# Patient Record
Sex: Female | Born: 1940 | Race: White | Hispanic: No | Smoking: Former smoker
Health system: Southern US, Community
[De-identification: ages and names within clinical notes are randomized; demographics above are authoritative.]

## PROBLEM LIST (undated history)

## (undated) DIAGNOSIS — I82409 Acute embolism and thrombosis of unspecified deep veins of unspecified lower extremity: Secondary | ICD-10-CM

## (undated) DIAGNOSIS — I700 Atherosclerosis of aorta: Secondary | ICD-10-CM

## (undated) DIAGNOSIS — M8580 Other specified disorders of bone density and structure, unspecified site: Secondary | ICD-10-CM

## (undated) DIAGNOSIS — I341 Nonrheumatic mitral (valve) prolapse: Secondary | ICD-10-CM

## (undated) DIAGNOSIS — I739 Peripheral vascular disease, unspecified: Secondary | ICD-10-CM

## (undated) DIAGNOSIS — E041 Nontoxic single thyroid nodule: Secondary | ICD-10-CM

## (undated) DIAGNOSIS — F411 Generalized anxiety disorder: Secondary | ICD-10-CM

## (undated) DIAGNOSIS — Z9889 Other specified postprocedural states: Secondary | ICD-10-CM

## (undated) DIAGNOSIS — I872 Venous insufficiency (chronic) (peripheral): Secondary | ICD-10-CM

## (undated) DIAGNOSIS — R9430 Abnormal result of cardiovascular function study, unspecified: Secondary | ICD-10-CM

## (undated) DIAGNOSIS — J984 Other disorders of lung: Secondary | ICD-10-CM

## (undated) DIAGNOSIS — Z860100 Personal history of colon polyps, unspecified: Secondary | ICD-10-CM

## (undated) DIAGNOSIS — K5730 Diverticulosis of large intestine without perforation or abscess without bleeding: Secondary | ICD-10-CM

## (undated) DIAGNOSIS — J309 Allergic rhinitis, unspecified: Secondary | ICD-10-CM

## (undated) DIAGNOSIS — S86011A Strain of right Achilles tendon, initial encounter: Secondary | ICD-10-CM

## (undated) DIAGNOSIS — E785 Hyperlipidemia, unspecified: Secondary | ICD-10-CM

## (undated) DIAGNOSIS — E669 Obesity, unspecified: Secondary | ICD-10-CM

## (undated) DIAGNOSIS — R4702 Dysphasia: Secondary | ICD-10-CM

## (undated) DIAGNOSIS — M519 Unspecified thoracic, thoracolumbar and lumbosacral intervertebral disc disorder: Secondary | ICD-10-CM

## (undated) DIAGNOSIS — I2510 Atherosclerotic heart disease of native coronary artery without angina pectoris: Secondary | ICD-10-CM

## (undated) DIAGNOSIS — M1990 Unspecified osteoarthritis, unspecified site: Secondary | ICD-10-CM

## (undated) DIAGNOSIS — Q211 Atrial septal defect: Secondary | ICD-10-CM

## (undated) DIAGNOSIS — K219 Gastro-esophageal reflux disease without esophagitis: Secondary | ICD-10-CM

## (undated) DIAGNOSIS — M545 Low back pain, unspecified: Secondary | ICD-10-CM

## (undated) DIAGNOSIS — D649 Anemia, unspecified: Secondary | ICD-10-CM

## (undated) DIAGNOSIS — R112 Nausea with vomiting, unspecified: Secondary | ICD-10-CM

## (undated) DIAGNOSIS — I639 Cerebral infarction, unspecified: Secondary | ICD-10-CM

## (undated) DIAGNOSIS — Q2112 Patent foramen ovale: Secondary | ICD-10-CM

## (undated) DIAGNOSIS — I10 Essential (primary) hypertension: Secondary | ICD-10-CM

## (undated) DIAGNOSIS — G629 Polyneuropathy, unspecified: Secondary | ICD-10-CM

## (undated) DIAGNOSIS — R195 Other fecal abnormalities: Secondary | ICD-10-CM

## (undated) DIAGNOSIS — K449 Diaphragmatic hernia without obstruction or gangrene: Secondary | ICD-10-CM

## (undated) DIAGNOSIS — I2699 Other pulmonary embolism without acute cor pulmonale: Secondary | ICD-10-CM

## (undated) DIAGNOSIS — I779 Disorder of arteries and arterioles, unspecified: Secondary | ICD-10-CM

## (undated) DIAGNOSIS — G4736 Sleep related hypoventilation in conditions classified elsewhere: Secondary | ICD-10-CM

## (undated) DIAGNOSIS — I5032 Chronic diastolic (congestive) heart failure: Secondary | ICD-10-CM

## (undated) DIAGNOSIS — G4730 Sleep apnea, unspecified: Secondary | ICD-10-CM

## (undated) DIAGNOSIS — R002 Palpitations: Secondary | ICD-10-CM

## (undated) DIAGNOSIS — R0602 Shortness of breath: Secondary | ICD-10-CM

## (undated) DIAGNOSIS — E538 Deficiency of other specified B group vitamins: Secondary | ICD-10-CM

## (undated) DIAGNOSIS — I6350 Cerebral infarction due to unspecified occlusion or stenosis of unspecified cerebral artery: Secondary | ICD-10-CM

## (undated) HISTORY — DX: Patent foramen ovale: Q21.12

## (undated) HISTORY — DX: Nonrheumatic mitral (valve) prolapse: I34.1

## (undated) HISTORY — PX: CHOLECYSTECTOMY: SHX55

## (undated) HISTORY — DX: Disorder of arteries and arterioles, unspecified: I77.9

## (undated) HISTORY — DX: Shortness of breath: R06.02

## (undated) HISTORY — DX: Dysphasia: R47.02

## (undated) HISTORY — DX: Palpitations: R00.2

## (undated) HISTORY — DX: Generalized anxiety disorder: F41.1

## (undated) HISTORY — DX: Nontoxic single thyroid nodule: E04.1

## (undated) HISTORY — DX: Venous insufficiency (chronic) (peripheral): I87.2

## (undated) HISTORY — DX: Cerebral infarction due to unspecified occlusion or stenosis of unspecified cerebral artery: I63.50

## (undated) HISTORY — DX: Acute embolism and thrombosis of unspecified deep veins of unspecified lower extremity: I82.409

## (undated) HISTORY — DX: Low back pain: M54.5

## (undated) HISTORY — DX: Peripheral vascular disease, unspecified: I73.9

## (undated) HISTORY — DX: Diaphragmatic hernia without obstruction or gangrene: K44.9

## (undated) HISTORY — DX: Diverticulosis of large intestine without perforation or abscess without bleeding: K57.30

## (undated) HISTORY — PX: BACK SURGERY: SHX140

## (undated) HISTORY — DX: Other specified disorders of bone density and structure, unspecified site: M85.80

## (undated) HISTORY — DX: Other fecal abnormalities: R19.5

## (undated) HISTORY — DX: Unspecified thoracic, thoracolumbar and lumbosacral intervertebral disc disorder: M51.9

## (undated) HISTORY — PX: LAPAROSCOPIC HYSTERECTOMY: SHX1926

## (undated) HISTORY — DX: Chronic diastolic (congestive) heart failure: I50.32

## (undated) HISTORY — DX: Anemia, unspecified: D64.9

## (undated) HISTORY — PX: LUMBAR DISC SURGERY: SHX700

## (undated) HISTORY — DX: Atherosclerotic heart disease of native coronary artery without angina pectoris: I25.10

## (undated) HISTORY — DX: Personal history of colonic polyps: Z86.010

## (undated) HISTORY — DX: Low back pain, unspecified: M54.50

## (undated) HISTORY — PX: OTHER SURGICAL HISTORY: SHX169

## (undated) HISTORY — DX: Personal history of colon polyps, unspecified: Z86.0100

## (undated) HISTORY — DX: Hyperlipidemia, unspecified: E78.5

## (undated) HISTORY — DX: Atherosclerosis of aorta: I70.0

## (undated) HISTORY — DX: Atrial septal defect: Q21.1

## (undated) HISTORY — DX: Other pulmonary embolism without acute cor pulmonale: I26.99

## (undated) HISTORY — DX: Sleep related hypoventilation in conditions classified elsewhere: G47.36

## (undated) HISTORY — DX: Abnormal result of cardiovascular function study, unspecified: R94.30

## (undated) HISTORY — DX: Gastro-esophageal reflux disease without esophagitis: K21.9

## (undated) HISTORY — DX: Cerebral infarction, unspecified: I63.9

## (undated) HISTORY — DX: Unspecified osteoarthritis, unspecified site: M19.90

## (undated) HISTORY — DX: Allergic rhinitis, unspecified: J30.9

## (undated) HISTORY — DX: Obesity, unspecified: E66.9

## (undated) HISTORY — DX: Other disorders of lung: J98.4

## (undated) HISTORY — PX: VARICOSE VEIN SURGERY: SHX832

---

## 1989-02-11 HISTORY — PX: CORONARY ANGIOPLASTY WITH STENT PLACEMENT: SHX49

## 1991-06-14 HISTORY — PX: OTHER SURGICAL HISTORY: SHX169

## 1998-03-26 ENCOUNTER — Emergency Department (HOSPITAL_COMMUNITY): Admission: EM | Admit: 1998-03-26 | Discharge: 1998-03-26 | Payer: Self-pay | Admitting: Emergency Medicine

## 1998-09-03 ENCOUNTER — Other Ambulatory Visit: Admission: RE | Admit: 1998-09-03 | Discharge: 1998-09-03 | Payer: Self-pay | Admitting: Obstetrics & Gynecology

## 2000-03-16 ENCOUNTER — Other Ambulatory Visit: Admission: RE | Admit: 2000-03-16 | Discharge: 2000-03-16 | Payer: Self-pay | Admitting: Obstetrics & Gynecology

## 2001-10-15 ENCOUNTER — Ambulatory Visit (HOSPITAL_COMMUNITY): Admission: RE | Admit: 2001-10-15 | Discharge: 2001-10-15 | Payer: Self-pay | Admitting: Neurology

## 2001-10-15 ENCOUNTER — Encounter: Payer: Self-pay | Admitting: Neurology

## 2001-12-24 ENCOUNTER — Ambulatory Visit (HOSPITAL_COMMUNITY): Admission: RE | Admit: 2001-12-24 | Discharge: 2001-12-24 | Payer: Self-pay | Admitting: Pulmonary Disease

## 2001-12-24 ENCOUNTER — Encounter: Payer: Self-pay | Admitting: Pulmonary Disease

## 2002-01-14 ENCOUNTER — Ambulatory Visit (HOSPITAL_COMMUNITY): Admission: RE | Admit: 2002-01-14 | Discharge: 2002-01-14 | Payer: Self-pay | Admitting: Gastroenterology

## 2002-01-14 ENCOUNTER — Encounter: Payer: Self-pay | Admitting: Gastroenterology

## 2003-01-17 ENCOUNTER — Ambulatory Visit (HOSPITAL_COMMUNITY): Admission: RE | Admit: 2003-01-17 | Discharge: 2003-01-17 | Payer: Self-pay | Admitting: Obstetrics & Gynecology

## 2003-01-17 ENCOUNTER — Encounter: Payer: Self-pay | Admitting: Obstetrics & Gynecology

## 2003-01-31 ENCOUNTER — Encounter: Admission: RE | Admit: 2003-01-31 | Discharge: 2003-01-31 | Payer: Self-pay | Admitting: Gastroenterology

## 2003-01-31 ENCOUNTER — Encounter: Payer: Self-pay | Admitting: Gastroenterology

## 2003-03-03 ENCOUNTER — Ambulatory Visit (HOSPITAL_COMMUNITY): Admission: RE | Admit: 2003-03-03 | Discharge: 2003-03-03 | Payer: Self-pay | Admitting: Neurosurgery

## 2003-03-03 ENCOUNTER — Encounter: Payer: Self-pay | Admitting: Neurosurgery

## 2003-04-07 ENCOUNTER — Ambulatory Visit (HOSPITAL_COMMUNITY): Admission: RE | Admit: 2003-04-07 | Discharge: 2003-04-07 | Payer: Self-pay | Admitting: Cardiovascular Disease

## 2003-06-12 ENCOUNTER — Inpatient Hospital Stay (HOSPITAL_COMMUNITY): Admission: RE | Admit: 2003-06-12 | Discharge: 2003-06-20 | Payer: Self-pay | Admitting: Neurosurgery

## 2004-04-07 ENCOUNTER — Other Ambulatory Visit: Admission: RE | Admit: 2004-04-07 | Discharge: 2004-04-07 | Payer: Self-pay | Admitting: Obstetrics & Gynecology

## 2004-04-21 ENCOUNTER — Ambulatory Visit: Payer: Self-pay | Admitting: Pulmonary Disease

## 2004-04-26 ENCOUNTER — Ambulatory Visit: Payer: Self-pay | Admitting: Pulmonary Disease

## 2004-07-22 ENCOUNTER — Ambulatory Visit: Payer: Self-pay | Admitting: Pulmonary Disease

## 2004-09-23 ENCOUNTER — Emergency Department (HOSPITAL_COMMUNITY): Admission: EM | Admit: 2004-09-23 | Discharge: 2004-09-23 | Payer: Self-pay | Admitting: Emergency Medicine

## 2005-01-03 ENCOUNTER — Ambulatory Visit: Payer: Self-pay | Admitting: Pulmonary Disease

## 2005-02-15 ENCOUNTER — Ambulatory Visit: Payer: Self-pay | Admitting: Pulmonary Disease

## 2005-03-09 ENCOUNTER — Ambulatory Visit: Payer: Self-pay | Admitting: *Deleted

## 2005-03-16 ENCOUNTER — Inpatient Hospital Stay (HOSPITAL_BASED_OUTPATIENT_CLINIC_OR_DEPARTMENT_OTHER): Admission: RE | Admit: 2005-03-16 | Discharge: 2005-03-16 | Payer: Self-pay | Admitting: Cardiology

## 2005-03-17 ENCOUNTER — Ambulatory Visit (HOSPITAL_COMMUNITY): Admission: RE | Admit: 2005-03-17 | Discharge: 2005-03-18 | Payer: Self-pay | Admitting: Cardiology

## 2005-03-18 ENCOUNTER — Emergency Department (HOSPITAL_COMMUNITY): Admission: EM | Admit: 2005-03-18 | Discharge: 2005-03-19 | Payer: Self-pay | Admitting: Emergency Medicine

## 2005-03-18 ENCOUNTER — Ambulatory Visit: Payer: Self-pay | Admitting: Cardiology

## 2005-04-04 ENCOUNTER — Ambulatory Visit: Payer: Self-pay | Admitting: Cardiology

## 2005-04-06 ENCOUNTER — Ambulatory Visit: Payer: Self-pay

## 2005-04-14 ENCOUNTER — Ambulatory Visit: Payer: Self-pay | Admitting: Pulmonary Disease

## 2005-04-15 ENCOUNTER — Ambulatory Visit: Payer: Self-pay | Admitting: Cardiology

## 2005-04-19 ENCOUNTER — Other Ambulatory Visit: Admission: RE | Admit: 2005-04-19 | Discharge: 2005-04-19 | Payer: Self-pay | Admitting: Obstetrics & Gynecology

## 2005-05-20 ENCOUNTER — Ambulatory Visit: Payer: Self-pay | Admitting: Pulmonary Disease

## 2005-06-02 ENCOUNTER — Encounter (HOSPITAL_COMMUNITY): Admission: RE | Admit: 2005-06-02 | Discharge: 2005-08-31 | Payer: Self-pay | Admitting: Cardiology

## 2005-07-01 ENCOUNTER — Ambulatory Visit: Payer: Self-pay | Admitting: Pulmonary Disease

## 2005-08-17 ENCOUNTER — Ambulatory Visit: Payer: Self-pay | Admitting: Pulmonary Disease

## 2005-09-12 ENCOUNTER — Ambulatory Visit: Payer: Self-pay | Admitting: Cardiology

## 2005-11-15 ENCOUNTER — Ambulatory Visit: Payer: Self-pay | Admitting: Pulmonary Disease

## 2005-11-18 ENCOUNTER — Ambulatory Visit: Payer: Self-pay | Admitting: Cardiology

## 2005-12-06 ENCOUNTER — Ambulatory Visit: Payer: Self-pay | Admitting: Cardiovascular Disease

## 2005-12-08 ENCOUNTER — Ambulatory Visit: Payer: Self-pay | Admitting: Gastroenterology

## 2005-12-16 ENCOUNTER — Ambulatory Visit: Payer: Self-pay | Admitting: Gastroenterology

## 2005-12-19 ENCOUNTER — Encounter (INDEPENDENT_AMBULATORY_CARE_PROVIDER_SITE_OTHER): Payer: Self-pay | Admitting: Specialist

## 2005-12-19 ENCOUNTER — Encounter: Payer: Self-pay | Admitting: Gastroenterology

## 2006-02-03 ENCOUNTER — Ambulatory Visit: Payer: Self-pay | Admitting: Cardiology

## 2006-02-07 ENCOUNTER — Ambulatory Visit: Payer: Self-pay

## 2006-02-27 ENCOUNTER — Ambulatory Visit: Payer: Self-pay | Admitting: Pulmonary Disease

## 2006-03-15 ENCOUNTER — Ambulatory Visit: Payer: Self-pay | Admitting: *Deleted

## 2006-03-28 ENCOUNTER — Ambulatory Visit: Payer: Self-pay | Admitting: *Deleted

## 2006-03-28 LAB — CONVERTED CEMR LAB
ALT: 31 units/L (ref 0–40)
AST: 33 units/L (ref 0–37)
Albumin: 3.6 g/dL (ref 3.5–5.2)
Alkaline Phosphatase: 90 units/L (ref 39–117)
Bilirubin, Direct: 0.1 mg/dL (ref 0.0–0.3)
Chol/HDL Ratio, serum: 3.9
Cholesterol: 169 mg/dL (ref 0–200)
HDL: 43.6 mg/dL (ref >39.0)
LDL Cholesterol: 98 mg/dL (ref 0–99)
Total Bilirubin: 0.6 mg/dL (ref 0.3–1.2)
Total Protein: 6.9 g/dL (ref 6.0–8.3)
Triglyceride fasting, serum: 135 mg/dL (ref 0–149)
VLDL: 27 mg/dL (ref 0–40)

## 2006-04-05 ENCOUNTER — Ambulatory Visit: Payer: Self-pay | Admitting: Pulmonary Disease

## 2006-04-17 ENCOUNTER — Ambulatory Visit: Payer: Self-pay | Admitting: Pulmonary Disease

## 2006-05-19 ENCOUNTER — Encounter: Admission: RE | Admit: 2006-05-19 | Discharge: 2006-05-19 | Payer: Self-pay | Admitting: Obstetrics & Gynecology

## 2006-08-14 ENCOUNTER — Ambulatory Visit: Payer: Self-pay | Admitting: Vascular Surgery

## 2006-09-12 ENCOUNTER — Ambulatory Visit: Payer: Self-pay | Admitting: Vascular Surgery

## 2007-03-28 ENCOUNTER — Ambulatory Visit: Payer: Self-pay | Admitting: Pulmonary Disease

## 2007-04-19 ENCOUNTER — Ambulatory Visit: Payer: Self-pay | Admitting: Pulmonary Disease

## 2007-04-19 DIAGNOSIS — F411 Generalized anxiety disorder: Secondary | ICD-10-CM | POA: Insufficient documentation

## 2007-04-19 DIAGNOSIS — E7800 Pure hypercholesterolemia, unspecified: Secondary | ICD-10-CM

## 2007-04-19 DIAGNOSIS — E669 Obesity, unspecified: Secondary | ICD-10-CM | POA: Insufficient documentation

## 2007-04-19 DIAGNOSIS — D649 Anemia, unspecified: Secondary | ICD-10-CM | POA: Insufficient documentation

## 2007-04-19 DIAGNOSIS — M1990 Unspecified osteoarthritis, unspecified site: Secondary | ICD-10-CM

## 2007-04-19 DIAGNOSIS — J309 Allergic rhinitis, unspecified: Secondary | ICD-10-CM | POA: Insufficient documentation

## 2007-04-19 DIAGNOSIS — J984 Other disorders of lung: Secondary | ICD-10-CM

## 2007-04-19 DIAGNOSIS — I10 Essential (primary) hypertension: Secondary | ICD-10-CM | POA: Insufficient documentation

## 2007-04-19 DIAGNOSIS — K219 Gastro-esophageal reflux disease without esophagitis: Secondary | ICD-10-CM

## 2007-04-25 LAB — CONVERTED CEMR LAB
ALT: 31 units/L (ref 0–35)
AST: 30 units/L (ref 0–37)
Albumin: 3.6 g/dL (ref 3.5–5.2)
Alkaline Phosphatase: 75 units/L (ref 39–117)
BUN: 7 mg/dL (ref 6–23)
Basophils Absolute: 0 10*3/uL (ref 0.0–0.1)
Basophils Relative: 0.4 % (ref 0.0–1.0)
Bilirubin, Direct: 0.1 mg/dL (ref 0.0–0.3)
CO2: 32 meq/L (ref 19–32)
Calcium: 10 mg/dL (ref 8.4–10.5)
Chloride: 108 meq/L (ref 96–112)
Cholesterol: 167 mg/dL (ref 0–200)
Creatinine, Ser: 0.8 mg/dL (ref 0.4–1.2)
Eosinophils Absolute: 0.3 10*3/uL (ref 0.0–0.6)
Eosinophils Relative: 3.9 % (ref 0.0–5.0)
GFR calc Af Amer: 92 mL/min
GFR calc non Af Amer: 76 mL/min
Glucose, Bld: 117 mg/dL — ABNORMAL HIGH (ref 70–99)
HCT: 40.7 % (ref 36.0–46.0)
HDL: 42 mg/dL (ref >39.0)
Hemoglobin: 14 g/dL (ref 12.0–15.0)
LDL Cholesterol: 102 mg/dL — ABNORMAL HIGH (ref 0–99)
Lymphocytes Relative: 22.4 % (ref 12.0–46.0)
MCHC: 34.5 g/dL (ref 30.0–36.0)
MCV: 91.9 fL (ref 78.0–100.0)
Monocytes Absolute: 0.8 10*3/uL — ABNORMAL HIGH (ref 0.2–0.7)
Monocytes Relative: 12.1 % — ABNORMAL HIGH (ref 3.0–11.0)
Neutro Abs: 3.9 10*3/uL (ref 1.4–7.7)
Neutrophils Relative %: 61.2 % (ref 43.0–77.0)
Platelets: 250 10*3/uL (ref 150–400)
Potassium: 5.1 meq/L (ref 3.5–5.1)
RBC: 4.43 M/uL (ref 3.87–5.11)
RDW: 13.6 % (ref 11.5–14.6)
Sodium: 147 meq/L — ABNORMAL HIGH (ref 135–145)
TSH: 1.53 microintl units/mL (ref 0.35–5.50)
Total Bilirubin: 0.8 mg/dL (ref 0.3–1.2)
Total CHOL/HDL Ratio: 4
Total Protein: 7.2 g/dL (ref 6.0–8.3)
Triglycerides: 113 mg/dL (ref 0–149)
VLDL: 23 mg/dL (ref 0–40)
WBC: 6.5 10*3/uL (ref 4.5–10.5)

## 2007-04-27 ENCOUNTER — Ambulatory Visit: Payer: Self-pay

## 2007-05-01 ENCOUNTER — Ambulatory Visit: Payer: Self-pay | Admitting: Pulmonary Disease

## 2007-06-25 ENCOUNTER — Ambulatory Visit: Payer: Self-pay | Admitting: Cardiology

## 2007-06-28 ENCOUNTER — Encounter: Payer: Self-pay | Admitting: Pulmonary Disease

## 2007-06-28 ENCOUNTER — Ambulatory Visit: Payer: Self-pay

## 2007-07-12 ENCOUNTER — Telehealth: Payer: Self-pay | Admitting: Pulmonary Disease

## 2007-08-06 ENCOUNTER — Telehealth (INDEPENDENT_AMBULATORY_CARE_PROVIDER_SITE_OTHER): Payer: Self-pay | Admitting: *Deleted

## 2007-10-03 ENCOUNTER — Telehealth: Payer: Self-pay | Admitting: Pulmonary Disease

## 2008-03-05 ENCOUNTER — Ambulatory Visit: Payer: Self-pay | Admitting: Pulmonary Disease

## 2008-04-09 ENCOUNTER — Ambulatory Visit: Payer: Self-pay | Admitting: Cardiology

## 2008-04-18 ENCOUNTER — Ambulatory Visit: Payer: Self-pay | Admitting: Cardiology

## 2008-04-18 LAB — CONVERTED CEMR LAB
BUN: 11 mg/dL (ref 6–23)
Basophils Absolute: 0 10*3/uL (ref 0.0–0.1)
Basophils Relative: 0.6 % (ref 0.0–3.0)
CO2: 30 meq/L (ref 19–32)
Calcium: 9.4 mg/dL (ref 8.4–10.5)
Chloride: 105 meq/L (ref 96–112)
Creatinine, Ser: 0.7 mg/dL (ref 0.4–1.2)
Eosinophils Absolute: 0.1 10*3/uL (ref 0.0–0.7)
Eosinophils Relative: 1.8 % (ref 0.0–5.0)
GFR calc Af Amer: 107 mL/min
GFR calc non Af Amer: 88 mL/min
Glucose, Bld: 116 mg/dL — ABNORMAL HIGH (ref 70–99)
HCT: 39.7 % (ref 36.0–46.0)
Hemoglobin: 13.8 g/dL (ref 12.0–15.0)
INR: 1 (ref 0.8–1.0)
Lymphocytes Relative: 20.5 % (ref 12.0–46.0)
MCHC: 34.8 g/dL (ref 30.0–36.0)
MCV: 91.9 fL (ref 78.0–100.0)
Monocytes Absolute: 0.8 10*3/uL (ref 0.1–1.0)
Monocytes Relative: 11.4 % (ref 3.0–12.0)
Neutro Abs: 4.7 10*3/uL (ref 1.4–7.7)
Neutrophils Relative %: 65.7 % (ref 43.0–77.0)
Platelets: 222 10*3/uL (ref 150–400)
Potassium: 4.6 meq/L (ref 3.5–5.1)
Prothrombin Time: 12.5 s (ref 10.9–13.3)
RBC: 4.32 M/uL (ref 3.87–5.11)
RDW: 13.8 % (ref 11.5–14.6)
Sodium: 140 meq/L (ref 135–145)
WBC: 7.1 10*3/uL (ref 4.5–10.5)
aPTT: 27.4 s (ref 21.7–29.8)

## 2008-04-21 ENCOUNTER — Ambulatory Visit: Payer: Self-pay | Admitting: Cardiology

## 2008-04-21 ENCOUNTER — Inpatient Hospital Stay (HOSPITAL_BASED_OUTPATIENT_CLINIC_OR_DEPARTMENT_OTHER): Admission: RE | Admit: 2008-04-21 | Discharge: 2008-04-21 | Payer: Self-pay | Admitting: Cardiology

## 2008-04-25 ENCOUNTER — Ambulatory Visit: Payer: Self-pay | Admitting: Cardiology

## 2008-04-25 ENCOUNTER — Observation Stay (HOSPITAL_COMMUNITY): Admission: AD | Admit: 2008-04-25 | Discharge: 2008-04-26 | Payer: Self-pay | Admitting: Cardiology

## 2008-05-15 ENCOUNTER — Ambulatory Visit: Payer: Self-pay | Admitting: Cardiology

## 2008-06-10 ENCOUNTER — Telehealth: Payer: Self-pay | Admitting: Pulmonary Disease

## 2008-07-21 ENCOUNTER — Encounter: Payer: Self-pay | Admitting: Pulmonary Disease

## 2008-07-25 ENCOUNTER — Ambulatory Visit: Payer: Self-pay | Admitting: Pulmonary Disease

## 2008-08-08 ENCOUNTER — Ambulatory Visit: Payer: Self-pay | Admitting: Pulmonary Disease

## 2008-08-08 DIAGNOSIS — R0600 Dyspnea, unspecified: Secondary | ICD-10-CM

## 2008-08-11 ENCOUNTER — Ambulatory Visit: Payer: Self-pay | Admitting: Pulmonary Disease

## 2008-08-12 ENCOUNTER — Encounter: Payer: Self-pay | Admitting: Pulmonary Disease

## 2008-08-17 LAB — CONVERTED CEMR LAB
ALT: 16 units/L (ref 0–35)
AST: 16 units/L (ref 0–37)
Albumin: 3.3 g/dL — ABNORMAL LOW (ref 3.5–5.2)
Alkaline Phosphatase: 86 units/L (ref 39–117)
BUN: 11 mg/dL (ref 6–23)
Basophils Absolute: 0 10*3/uL (ref 0.0–0.1)
Basophils Relative: 0.5 % (ref 0.0–3.0)
Bilirubin Urine: NEGATIVE
Bilirubin, Direct: 0.1 mg/dL (ref 0.0–0.3)
CO2: 28 meq/L (ref 19–32)
Calcium: 9.2 mg/dL (ref 8.4–10.5)
Chloride: 108 meq/L (ref 96–112)
Cholesterol: 169 mg/dL (ref 0–200)
Creatinine, Ser: 0.7 mg/dL (ref 0.4–1.2)
Eosinophils Absolute: 0.2 10*3/uL (ref 0.0–0.7)
Eosinophils Relative: 4 % (ref 0.0–5.0)
GFR calc Af Amer: 107 mL/min
GFR calc non Af Amer: 88 mL/min
Glucose, Bld: 104 mg/dL — ABNORMAL HIGH (ref 70–99)
HCT: 40.3 % (ref 36.0–46.0)
HDL: 50.9 mg/dL (ref >39.0)
Hemoglobin, Urine: NEGATIVE
Hemoglobin: 13.7 g/dL (ref 12.0–15.0)
Hgb A1c MFr Bld: 6.5 % — ABNORMAL HIGH (ref 4.6–6.0)
Ketones, ur: NEGATIVE mg/dL
LDL Cholesterol: 92 mg/dL (ref 0–99)
Leukocytes, UA: NEGATIVE
Lymphocytes Relative: 26.3 % (ref 12.0–46.0)
MCHC: 34.1 g/dL (ref 30.0–36.0)
MCV: 92 fL (ref 78.0–100.0)
Monocytes Absolute: 0.6 10*3/uL (ref 0.1–1.0)
Monocytes Relative: 10.7 % (ref 3.0–12.0)
Neutro Abs: 3.6 10*3/uL (ref 1.4–7.7)
Neutrophils Relative %: 58.5 % (ref 43.0–77.0)
Nitrite: NEGATIVE
Platelets: 188 10*3/uL (ref 150–400)
Potassium: 4.2 meq/L (ref 3.5–5.1)
Pro B Natriuretic peptide (BNP): 67 pg/mL (ref 0.0–100.0)
RBC: 4.38 M/uL (ref 3.87–5.11)
RDW: 13.7 % (ref 11.5–14.6)
Sodium: 143 meq/L (ref 135–145)
Specific Gravity, Urine: >=1.03 (ref 1.000–1.035)
TSH: 1.84 microintl units/mL (ref 0.35–5.50)
Total Bilirubin: 0.7 mg/dL (ref 0.3–1.2)
Total CHOL/HDL Ratio: 3.3
Total Protein, Urine: NEGATIVE mg/dL
Total Protein: 6.8 g/dL (ref 6.0–8.3)
Triglycerides: 131 mg/dL (ref 0–149)
Urine Glucose: NEGATIVE mg/dL
Urobilinogen, UA: 0.2 (ref 0.0–1.0)
VLDL: 26 mg/dL (ref 0–40)
WBC: 6 10*3/uL (ref 4.5–10.5)
pH: 5.5 (ref 5.0–8.0)

## 2008-08-31 DIAGNOSIS — IMO0001 Reserved for inherently not codable concepts without codable children: Secondary | ICD-10-CM

## 2008-08-31 DIAGNOSIS — D126 Benign neoplasm of colon, unspecified: Secondary | ICD-10-CM | POA: Insufficient documentation

## 2008-08-31 DIAGNOSIS — I872 Venous insufficiency (chronic) (peripheral): Secondary | ICD-10-CM | POA: Insufficient documentation

## 2008-08-31 DIAGNOSIS — K5730 Diverticulosis of large intestine without perforation or abscess without bleeding: Secondary | ICD-10-CM | POA: Insufficient documentation

## 2008-08-31 DIAGNOSIS — E041 Nontoxic single thyroid nodule: Secondary | ICD-10-CM | POA: Insufficient documentation

## 2008-08-31 DIAGNOSIS — M545 Low back pain: Secondary | ICD-10-CM

## 2008-09-23 ENCOUNTER — Telehealth (INDEPENDENT_AMBULATORY_CARE_PROVIDER_SITE_OTHER): Payer: Self-pay | Admitting: *Deleted

## 2008-09-23 ENCOUNTER — Ambulatory Visit: Payer: Self-pay | Admitting: Pulmonary Disease

## 2008-10-15 ENCOUNTER — Ambulatory Visit: Payer: Self-pay | Admitting: Cardiology

## 2008-11-05 ENCOUNTER — Telehealth (INDEPENDENT_AMBULATORY_CARE_PROVIDER_SITE_OTHER): Payer: Self-pay | Admitting: *Deleted

## 2008-11-11 DIAGNOSIS — I639 Cerebral infarction, unspecified: Secondary | ICD-10-CM

## 2008-11-11 HISTORY — DX: Cerebral infarction, unspecified: I63.9

## 2008-11-12 ENCOUNTER — Encounter (INDEPENDENT_AMBULATORY_CARE_PROVIDER_SITE_OTHER): Payer: Self-pay | Admitting: Pediatrics

## 2008-11-12 ENCOUNTER — Inpatient Hospital Stay (HOSPITAL_COMMUNITY): Admission: RE | Admit: 2008-11-12 | Discharge: 2008-11-17 | Payer: Self-pay | Admitting: Pediatrics

## 2008-11-12 ENCOUNTER — Ambulatory Visit: Payer: Self-pay | Admitting: Cardiology

## 2008-11-12 ENCOUNTER — Encounter: Payer: Self-pay | Admitting: Pediatrics

## 2008-11-13 ENCOUNTER — Ambulatory Visit: Payer: Self-pay | Admitting: Physical Medicine & Rehabilitation

## 2008-11-13 ENCOUNTER — Encounter (INDEPENDENT_AMBULATORY_CARE_PROVIDER_SITE_OTHER): Payer: Self-pay | Admitting: *Deleted

## 2008-11-17 ENCOUNTER — Encounter: Payer: Self-pay | Admitting: Cardiology

## 2008-11-19 ENCOUNTER — Ambulatory Visit: Payer: Self-pay | Admitting: Cardiology

## 2008-11-19 LAB — CONVERTED CEMR LAB
POC INR: 2.5
Protime: 19.4

## 2008-11-24 ENCOUNTER — Encounter: Payer: Self-pay | Admitting: Cardiology

## 2008-11-24 LAB — CONVERTED CEMR LAB
POC INR: 2.3
Protime: 27.3

## 2008-12-02 ENCOUNTER — Telehealth (INDEPENDENT_AMBULATORY_CARE_PROVIDER_SITE_OTHER): Payer: Self-pay

## 2008-12-02 ENCOUNTER — Encounter: Payer: Self-pay | Admitting: Cardiology

## 2008-12-02 LAB — CONVERTED CEMR LAB
POC INR: 1.7
Prothrombin Time: 24.4 s

## 2008-12-09 ENCOUNTER — Ambulatory Visit: Payer: Self-pay | Admitting: Cardiology

## 2008-12-16 ENCOUNTER — Ambulatory Visit: Payer: Self-pay | Admitting: Cardiology

## 2008-12-16 LAB — CONVERTED CEMR LAB: Prothrombin Time: 15.6 s

## 2008-12-23 ENCOUNTER — Encounter (INDEPENDENT_AMBULATORY_CARE_PROVIDER_SITE_OTHER): Payer: Self-pay | Admitting: Cardiology

## 2008-12-23 ENCOUNTER — Ambulatory Visit: Payer: Self-pay | Admitting: Cardiology

## 2008-12-23 LAB — CONVERTED CEMR LAB

## 2008-12-24 ENCOUNTER — Encounter: Payer: Self-pay | Admitting: Cardiology

## 2008-12-30 ENCOUNTER — Ambulatory Visit: Payer: Self-pay | Admitting: Cardiology

## 2008-12-30 LAB — CONVERTED CEMR LAB
POC INR: 1.5
Prothrombin Time: 14.9 s

## 2009-01-07 ENCOUNTER — Encounter: Payer: Self-pay | Admitting: Cardiology

## 2009-01-07 ENCOUNTER — Ambulatory Visit: Payer: Self-pay | Admitting: Cardiology

## 2009-01-13 ENCOUNTER — Ambulatory Visit: Payer: Self-pay | Admitting: Cardiology

## 2009-01-13 ENCOUNTER — Telehealth: Payer: Self-pay | Admitting: Cardiology

## 2009-01-13 LAB — CONVERTED CEMR LAB
Basophils Absolute: 0 10*3/uL (ref 0.0–0.1)
Basophils Relative: 0.6 % (ref 0.0–3.0)
CO2: 32 meq/L (ref 19–32)
Calcium: 9.4 mg/dL (ref 8.4–10.5)
Creatinine, Ser: 0.8 mg/dL (ref 0.4–1.2)
Eosinophils Absolute: 0.2 10*3/uL (ref 0.0–0.7)
Lymphocytes Relative: 30.8 % (ref 12.0–46.0)
MCHC: 34.6 g/dL (ref 30.0–36.0)
Neutrophils Relative %: 53.9 % (ref 43.0–77.0)
RBC: 4.24 M/uL (ref 3.87–5.11)

## 2009-01-29 ENCOUNTER — Encounter: Payer: Self-pay | Admitting: Cardiology

## 2009-03-06 ENCOUNTER — Ambulatory Visit: Payer: Self-pay | Admitting: Pulmonary Disease

## 2009-03-20 ENCOUNTER — Encounter: Payer: Self-pay | Admitting: Pulmonary Disease

## 2009-04-13 ENCOUNTER — Encounter: Payer: Self-pay | Admitting: Pulmonary Disease

## 2009-04-13 ENCOUNTER — Encounter: Payer: Self-pay | Admitting: Cardiology

## 2009-04-20 ENCOUNTER — Encounter: Payer: Self-pay | Admitting: Cardiology

## 2009-04-21 ENCOUNTER — Telehealth: Payer: Self-pay | Admitting: Pulmonary Disease

## 2009-04-21 ENCOUNTER — Telehealth: Payer: Self-pay | Admitting: Gastroenterology

## 2009-04-21 ENCOUNTER — Ambulatory Visit: Payer: Self-pay | Admitting: Internal Medicine

## 2009-04-22 ENCOUNTER — Ambulatory Visit: Payer: Self-pay | Admitting: Internal Medicine

## 2009-04-24 ENCOUNTER — Telehealth: Payer: Self-pay | Admitting: Nurse Practitioner

## 2009-04-27 ENCOUNTER — Ambulatory Visit: Payer: Self-pay | Admitting: Nurse Practitioner

## 2009-04-27 ENCOUNTER — Telehealth: Payer: Self-pay | Admitting: Pulmonary Disease

## 2009-04-27 LAB — CONVERTED CEMR LAB
BUN: 8 mg/dL (ref 6–23)
Basophils Relative: 0.5 % (ref 0.0–3.0)
CO2: 31 meq/L (ref 19–32)
Chloride: 105 meq/L (ref 96–112)
Creatinine, Ser: 0.8 mg/dL (ref 0.4–1.2)
Eosinophils Relative: 2.2 % (ref 0.0–5.0)
Glucose, Bld: 88 mg/dL (ref 70–99)
Hemoglobin: 14 g/dL (ref 12.0–15.0)
Lymphocytes Relative: 27.1 % (ref 12.0–46.0)
MCHC: 34.1 g/dL (ref 30.0–36.0)
Monocytes Relative: 13 % — ABNORMAL HIGH (ref 3.0–12.0)
Neutro Abs: 4.1 10*3/uL (ref 1.4–7.7)
RBC: 4.33 M/uL (ref 3.87–5.11)

## 2009-04-30 ENCOUNTER — Telehealth: Payer: Self-pay | Admitting: Nurse Practitioner

## 2009-05-01 LAB — CONVERTED CEMR LAB
Bilirubin Urine: NEGATIVE
Hemoglobin, Urine: NEGATIVE
Total Protein, Urine: NEGATIVE mg/dL
Urine Glucose: NEGATIVE mg/dL

## 2009-06-15 ENCOUNTER — Encounter (INDEPENDENT_AMBULATORY_CARE_PROVIDER_SITE_OTHER): Payer: Self-pay | Admitting: *Deleted

## 2009-06-15 ENCOUNTER — Ambulatory Visit: Payer: Self-pay | Admitting: Cardiology

## 2009-07-06 ENCOUNTER — Telehealth (INDEPENDENT_AMBULATORY_CARE_PROVIDER_SITE_OTHER): Payer: Self-pay | Admitting: *Deleted

## 2009-07-17 ENCOUNTER — Ambulatory Visit: Payer: Self-pay | Admitting: Pulmonary Disease

## 2009-07-17 DIAGNOSIS — G4733 Obstructive sleep apnea (adult) (pediatric): Secondary | ICD-10-CM | POA: Insufficient documentation

## 2009-07-17 DIAGNOSIS — G4736 Sleep related hypoventilation in conditions classified elsewhere: Secondary | ICD-10-CM | POA: Insufficient documentation

## 2009-09-21 ENCOUNTER — Ambulatory Visit: Payer: Self-pay | Admitting: Pulmonary Disease

## 2009-09-21 ENCOUNTER — Ambulatory Visit (HOSPITAL_COMMUNITY): Admission: RE | Admit: 2009-09-21 | Discharge: 2009-09-21 | Payer: Self-pay | Admitting: Adult Health

## 2009-09-21 ENCOUNTER — Encounter: Payer: Self-pay | Admitting: Adult Health

## 2009-09-24 LAB — CONVERTED CEMR LAB
AST: 20 units/L (ref 0–37)
Alkaline Phosphatase: 79 units/L (ref 39–117)
Bilirubin, Direct: 0.1 mg/dL (ref 0.0–0.3)
CO2: 33 meq/L — ABNORMAL HIGH (ref 19–32)
Calcium: 9.5 mg/dL (ref 8.4–10.5)
Chloride: 103 meq/L (ref 96–112)
Eosinophils Relative: 1.6 % (ref 0.0–5.0)
Glucose, Bld: 90 mg/dL (ref 70–99)
HCT: 39.8 % (ref 36.0–46.0)
Hemoglobin, Urine: NEGATIVE
Hemoglobin: 13.4 g/dL (ref 12.0–15.0)
Lymphocytes Relative: 27 % (ref 12.0–46.0)
Lymphs Abs: 2.1 10*3/uL (ref 0.7–4.0)
Monocytes Relative: 10.7 % (ref 3.0–12.0)
Nitrite: NEGATIVE
Platelets: 213 10*3/uL (ref 150.0–400.0)
Sodium: 144 meq/L (ref 135–145)
Specific Gravity, Urine: 1.015 (ref 1.000–1.030)
Total Bilirubin: 0.5 mg/dL (ref 0.3–1.2)
Total Protein, Urine: NEGATIVE mg/dL
Urine Glucose: NEGATIVE mg/dL
WBC: 7.8 10*3/uL (ref 4.5–10.5)
pH: 5.5 (ref 5.0–8.0)

## 2009-10-12 ENCOUNTER — Ambulatory Visit: Payer: Self-pay | Admitting: Cardiology

## 2009-10-19 ENCOUNTER — Telehealth (INDEPENDENT_AMBULATORY_CARE_PROVIDER_SITE_OTHER): Payer: Self-pay | Admitting: *Deleted

## 2009-10-20 ENCOUNTER — Ambulatory Visit: Payer: Self-pay

## 2009-10-20 ENCOUNTER — Encounter (HOSPITAL_COMMUNITY): Admission: RE | Admit: 2009-10-20 | Discharge: 2009-12-11 | Payer: Self-pay | Admitting: Cardiology

## 2009-10-20 ENCOUNTER — Ambulatory Visit: Payer: Self-pay | Admitting: Cardiology

## 2009-11-04 ENCOUNTER — Telehealth: Payer: Self-pay | Admitting: Cardiology

## 2009-11-05 ENCOUNTER — Encounter: Payer: Self-pay | Admitting: Pulmonary Disease

## 2009-12-07 ENCOUNTER — Encounter: Payer: Self-pay | Admitting: Pulmonary Disease

## 2009-12-25 ENCOUNTER — Telehealth: Payer: Self-pay | Admitting: Pulmonary Disease

## 2010-02-08 ENCOUNTER — Encounter: Admission: RE | Admit: 2010-02-08 | Discharge: 2010-02-08 | Payer: Self-pay | Admitting: Obstetrics & Gynecology

## 2010-02-26 ENCOUNTER — Encounter: Payer: Self-pay | Admitting: Pulmonary Disease

## 2010-03-08 ENCOUNTER — Telehealth (INDEPENDENT_AMBULATORY_CARE_PROVIDER_SITE_OTHER): Payer: Self-pay | Admitting: *Deleted

## 2010-04-05 ENCOUNTER — Encounter: Payer: Self-pay | Admitting: Pulmonary Disease

## 2010-04-27 ENCOUNTER — Encounter: Payer: Self-pay | Admitting: Pulmonary Disease

## 2010-04-27 ENCOUNTER — Ambulatory Visit: Payer: Self-pay | Admitting: Pulmonary Disease

## 2010-04-27 ENCOUNTER — Encounter: Payer: Self-pay | Admitting: Cardiology

## 2010-04-29 ENCOUNTER — Ambulatory Visit: Payer: Self-pay | Admitting: Pulmonary Disease

## 2010-04-29 ENCOUNTER — Ambulatory Visit: Payer: Self-pay | Admitting: Cardiology

## 2010-05-03 ENCOUNTER — Telehealth: Payer: Self-pay | Admitting: Cardiology

## 2010-05-05 ENCOUNTER — Telehealth: Payer: Self-pay | Admitting: Pulmonary Disease

## 2010-05-05 ENCOUNTER — Ambulatory Visit: Payer: Self-pay

## 2010-05-05 ENCOUNTER — Encounter: Payer: Self-pay | Admitting: Cardiology

## 2010-05-11 ENCOUNTER — Ambulatory Visit: Payer: Self-pay | Admitting: Pulmonary Disease

## 2010-06-03 ENCOUNTER — Telehealth (INDEPENDENT_AMBULATORY_CARE_PROVIDER_SITE_OTHER): Payer: Self-pay | Admitting: *Deleted

## 2010-06-10 ENCOUNTER — Telehealth: Payer: Self-pay | Admitting: Gastroenterology

## 2010-06-11 ENCOUNTER — Ambulatory Visit
Admission: RE | Admit: 2010-06-11 | Discharge: 2010-06-11 | Payer: Self-pay | Source: Home / Self Care | Attending: Internal Medicine | Admitting: Internal Medicine

## 2010-06-11 DIAGNOSIS — Z86010 Personal history of colonic polyps: Secondary | ICD-10-CM | POA: Insufficient documentation

## 2010-06-11 DIAGNOSIS — R1032 Left lower quadrant pain: Secondary | ICD-10-CM | POA: Insufficient documentation

## 2010-06-11 DIAGNOSIS — K5909 Other constipation: Secondary | ICD-10-CM | POA: Insufficient documentation

## 2010-06-11 DIAGNOSIS — K5900 Constipation, unspecified: Secondary | ICD-10-CM

## 2010-07-11 LAB — CONVERTED CEMR LAB
Albumin: 3.8 g/dL (ref 3.5–5.2)
Alkaline Phosphatase: 78 units/L (ref 39–117)
Basophils Absolute: 0 10*3/uL (ref 0.0–0.1)
Calcium: 9.2 mg/dL (ref 8.4–10.5)
Chloride: 104 meq/L (ref 96–112)
Direct LDL: 123.3 mg/dL
HDL: 62.8 mg/dL (ref >39.00)
Hemoglobin: 13.8 g/dL (ref 12.0–15.0)
Lymphocytes Relative: 27.2 % (ref 12.0–46.0)
Monocytes Relative: 12.3 % — ABNORMAL HIGH (ref 3.0–12.0)
Neutro Abs: 3.1 10*3/uL (ref 1.4–7.7)
Potassium: 4.3 meq/L (ref 3.5–5.1)
RBC: 4.27 M/uL (ref 3.87–5.11)
RDW: 14.2 % (ref 11.5–14.6)
Sodium: 142 meq/L (ref 135–145)
Total CHOL/HDL Ratio: 3
Total Protein: 6.6 g/dL (ref 6.0–8.3)
VLDL: 25.6 mg/dL (ref 0.0–40.0)

## 2010-07-13 NOTE — Assessment & Plan Note (Signed)
Summary: Stephanie Alvarado    Visit Type:  Follow-up Referring Provider:  Alroy Dust, MD Primary Provider:  Alroy Dust, MD  CC:  chest pain .  History of Present Illness: The patient is 70 years old and return for management of CAD. She had a drug-eluting stent placed in the circumflex artery in 2006. In 2009 she had a drug-eluting stent to the LAD which jailed a large septal perforator. She has done well with no recent exertional chest pain charge breath or palpitations.  Over the past month or 2 she has developed substernal burning sensation which he described as a raw sensation extending down to the epigastrium. This does not appear to be related to anything in particular although sometimes it's worse with eating and activity. She also has at night. She does have a history of a hiatal hernia and GERD. She is on Protonix chronically.  She also had a TIA or CVA in June of 2010. She had a negative workup. Her echocardiogram showed a small patent foramen but this was not to be thought likely to be the cause of her stroke.  Her other problems include hypertension hyperlipidemia.  When she was here last time we stop the Coumadin and treat her with Plavix and aspirin.  she also said she had a sleep study ordered by neurology. She didn't hear the results but oxygen was prescribed period as best I can tell she is not on CPAP.  Current Medications (verified): 1)  Aspirin 81 Mg Tbec (Aspirin) .... Take 1 Tab By Mouth Once Daily.Marland KitchenMarland Kitchen 2)  Plavix 75 Mg Tabs (Clopidogrel Bisulfate) .... Take 1 Tablet By Mouth Once A Day 3)  Imdur 30 Mg Xr24h-Tab (Isosorbide Mononitrate) .... Take 1 Tablet By Mouth Once A Day 4)  Metoprolol Tartrate 50 Mg  Tabs (Metoprolol Tartrate) .... Take 2 Tabs in Am, and 1 Tab in Pm. 5)  Furosemide 40 Mg Tabs (Furosemide) .Marland Kitchen.. 1 Once Daily 6)  Zocor 80 Mg  Tabs (Simvastatin) .... Take 1 Tablet By Mouth Once A Day 7)  Protonix 40 Mg Tbec (Pantoprazole Sodium) .... Take 1 Tab By Mouth Once  Daily.Marland KitchenMarland Kitchen 8)  Neurontin 300 Mg  Caps (Gabapentin) .... Take One Capsule By Mouth Three Times A Day 9)  Zolpidem Tartrate 10 Mg  Tabs (Zolpidem Tartrate) .... Take 1 Tab By Mouth At Bedtime As Needed 10)  Nitroglycerin 0.4 Mg Subl (Nitroglycerin) .... One Tablet Under Tongue Every 5 Minutes As Needed For Chest Pain---May Repeat Times Three 11)  Womens Multivitamin Plus  Tabs (Multiple Vitamins-Minerals) .... Take 1 Tab Daily... 12)  B Complex Vitamins  Caps (B Complex Vitamins) .... Take 1 Tab Daily...  Allergies (verified): 1)  ! Codeine  Past History:  Past Medical History: Reviewed history from 03/06/2009 and no changes required.  1. Coronary artery disease status post prior drug-eluting stent to the     circumflex artery in 2006 and recent drug-eluting stent to left     anterior descending 2. Good left ventricular function. 3. Hypertension. 4. Hyperlipidemia. 5. Hiatal hernia and gastroesophageal reflux disease. 6. Dysphasia, possible esophageal stricture. 7. Lumbar disk disease.  8. 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 embolic ?  AF Rx Coumadin pending event monitor 9. 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   Vital Signs:  Patient profile:   70 year old female Height:      67 inches Weight:      194 pounds BMI:     30.49 Pulse rate:   68 / minute BP sitting:   132 / 73   (left arm) Cuff size:   regular  Vitals Entered By: Burnett Kanaris, CNA (June 15, 2009 2:43 PM)  Physical Exam  Additional Exam:  Gen. Well-nourished, in no distress   Neck: No JVD, thyroid not enlarged, no carotid bruits Lungs: No tachypnea, clear without rales, rhonchi or wheezes Cardiovascular: Rhythm regular, PMI not displaced,  heart sounds  normal, no murmurs or gallops, no peripheral edema, pulses normal in all 4 extremities. Abdomen: BS normal, abdomen soft and non-tender without masses or organomegaly, no hepatosplenomegaly. MS: No deformities, no cyanosis or clubbing   Neuro:  No focal sns   Skin:  no lesions    Impression & Recommendations:  Problem # 1:  CHEST PAIN-PRECORDIAL (ICD-786.51) She has chest pain but it sounds burning and epigastric. It sounds more like reflux to me than coronary pain. It has been persistent despite Protonix. We will increase the protime to 40 mg b.i.d. and arrange GI referral to Dr. Jarold Motto who performed endoscopy in 2007.  Problem # 2:  CORONARY ARTERY DISEASE (ICD-414.00)  She has had previous stents as described above. She has no pain that sounds like angina. This problem appears stable. Her updated medication list for this problem includes:    Aspirin 81 Mg Tbec (Aspirin) .Marland Kitchen... Take 1 tab by mouth once daily...    Plavix 75 Mg Tabs (Clopidogrel bisulfate) .Marland Kitchen... Take 1 tablet by mouth once a day    Imdur 30 Mg Xr24h-tab (Isosorbide mononitrate) .Marland Kitchen... Take 1 tablet by mouth once a day    Metoprolol Tartrate 50 Mg Tabs (Metoprolol tartrate) .Marland Kitchen... Take 2 tabs in am, and 1 tab in pm.    Nitroglycerin 0.4 Mg Subl (Nitroglycerin) ..... One tablet under tongue every 5 minutes as needed for chest pain---may repeat times three  Her updated medication list for this problem includes:    Aspirin 81 Mg Tbec (Aspirin) .Marland Kitchen... Take 1 tab by mouth once daily...    Plavix 75 Mg Tabs (Clopidogrel bisulfate) .Marland Kitchen... Take 1 tablet by mouth once a day     Imdur 30 Mg Xr24h-tab (Isosorbide mononitrate) .Marland Kitchen... Take 1 tablet by mouth once a day    Metoprolol Tartrate 50 Mg Tabs (Metoprolol tartrate) .Marland Kitchen... Take 2 tabs in am, and 1 tab in pm.    Nitroglycerin 0.4 Mg Subl (Nitroglycerin) ..... One tablet under tongue every 5 minutes as needed for chest pain---may repeat times three  Problem # 3:  HYPERTENSION (ICD-401.9)  This appears controlled on current medications. Her updated medication list for this problem includes:    Aspirin 81 Mg Tbec (Aspirin) .Marland Kitchen... Take 1 tab by mouth once daily...    Metoprolol Tartrate 50 Mg Tabs (Metoprolol tartrate) .Marland Kitchen... Take 2 tabs in am, and 1 tab in pm.    Furosemide 40 Mg Tabs (Furosemide) .Marland Kitchen... 1 once daily  Her updated medication list for this problem includes:    Aspirin 81 Mg Tbec (Aspirin) .Marland Kitchen... Take 1 tab by mouth once daily...    Metoprolol Tartrate 50  Mg Tabs (Metoprolol tartrate) .Marland Kitchen... Take 2 tabs in am, and 1 tab in pm.    Furosemide 40 Mg Tabs (Furosemide) .Marland Kitchen... 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 We will try to get them. She has been prescribed home O2 but it sounds like nasal home O2 greater than CPAP. We will arrange referral to Dr. Kriste Basque who is her primary care physician to decide about further management.  Patient Instructions: 1)  Your physician has recommended you make the following change in your medication: 1) Increase Protonix to 40mg  two times a day. 2)  Your physician wants you to follow-up in: 6 months.  You will receive a reminder letter in the mail two months in advance. If you don't receive a letter, please call our office to schedule the follow-up appointment. 3)  We will schedule you a followup appointment in 1 week with Dr. Jarold Motto in GI. 4)  We will schedule you a followup appointment in 1-2 weeks with Dr. Kriste Basque.

## 2010-07-13 NOTE — Assessment & Plan Note (Signed)
Summary: flu shot //kpAND PNEUMONIA  Nurse Visit   Allergies: 1)  ! Codeine  Immunizations Administered:  Influenza Vaccine # 1:    Vaccine Type: Fluvax 3+    Site: right deltoid    Mfr: GlaxoSmithKline    Dose: 0.5 ml    Route: IM    Given by: Clarise Cruz (AAMA)    Exp. Date: 12/11/2010    Lot #: [id number]  Pneumonia Vaccine:    Vaccine Type: Pneumovax    Site: left deltoid    Mfr: Merck    Dose: 0.5 ml    Route: IM    Given by: Lynnae Sandhoff Ford,CMA (AAMA)    Exp. Date: 10/01/2010    Lot #: [id number]  Flu Vaccine Consent Questions:    Do you have a history of severe allergic reactions to this vaccine? no    Any prior history of allergic reactions to egg and/or gelatin? no    Do you have a sensitivity to the preservative Thimersol? no    Do you have a past history of Guillan-Barre Syndrome? no    Do you currently have an acute febrile illness? no    Have you ever had a severe reaction to latex? no    Vaccine information given and explained to patient? yes    Are you currently pregnant? no  Orders Added: 1)  Flu Vaccine 54yrs + [90658] 2)  Admin 1st Vaccine [90471] 3)  Pneumococcal Vaccine [90732] 4)  Admin of Any Addtl Vaccine [91478]

## 2010-07-13 NOTE — Progress Notes (Signed)
Summary: cough and congestion > augmention rx   Phone Note Call from Patient Call back at [phone]   Caller: Patient Call For: nadel Summary of Call: green congestion and cough [facility] Initial call taken by: Rickard Patience,  March 08, 2010 8:21 AM  Follow-up for Phone Call        [facility].  Aundra Millet Reynolds LPN  March 08, 2010 9:13 AM   [facility].  Aundra Millet Reynolds LPN  March 08, 2010 4:48 PM   Called, spoke with pt.  She c/o chest congestion and prod cough with dark green mucus since Friday.  Taking mucinex q4h.  Sweats yesterday.  Requesting generic abx.  [facility] Allergies: Codiene Dr. Kriste Basque, pls advise.  Thanks! Follow-up by: Gweneth Dimitri RN,  March 09, 2010 10:07 AM  Additional Follow-up for Phone Call Additional follow up Details #1::        per SN----ok for pt to have augmentin 875mg   #14   1 by mouth two times a day until gone.  thanks Randell Loop CMA  March 09, 2010 10:56 AM     Additional Follow-up for Phone Call Additional follow up Details #2::    Called, spoke with pt.  She was informed, per SN, take augmentin two times a day until gone and rx sent to [facility] in [place].  She verbalized understanding of instructions.  Gweneth Dimitri RN  March 09, 2010 11:00 AM   New/Updated Medications: AUGMENTIN 875-125 MG TABS (AMOXICILLIN-POT CLAVULANATE) Take 1 tablet by mouth two times a day until gone Prescriptions: AUGMENTIN 875-125 MG TABS (AMOXICILLIN-POT CLAVULANATE) Take 1 tablet by mouth two times a day until gone  #14 x 0   Entered by:   Gweneth Dimitri RN   Authorized by:   Michele Mcalpine MD   Signed by:   Gweneth Dimitri RN on 03/09/2010   Method used:   Electronically to        [facility] (retail)       [address]       [place], [place]  [address]       Ph: [phone]       Fax: [phone]   RxID:   [id number]

## 2010-07-13 NOTE — Assessment & Plan Note (Signed)
Summary: tingling in left side face and hand, numbness in hand/apc   Copy to:  Stephanie Dust, Alvarado Primary Provider/Referring Provider:  Alroy Dust, Alvarado  CC:  Acute visit.  Pt c/o left side of face tingling x 6 days.  Also c/o tingling worsening in her left hand and also her lips.  She also c/o "heart skipping beats" x several days.  Marland Kitchen  History of Present Illness: 70 year old female HTN, Hyperlipidemia, CAD   September 23, 2008--Pt fell in 06/2008 hurt right knee, seen by [place] [facility] w/ injections. Very painful. MRI showed torn cartildge/menicus     ~  Hosp 6/10 by Neuro w/ right MCA infarct- Rx w/ tPA, no source found... ?patent foramen- later ruled out w/ neg bubble study per pt hx... known CAD, ASPVD, norm LVF- disch on Coumadin but switched back to ASA/ PLAVIX by Stephanie Alvarado 7/10... he also stopped Norvasc & added LASIX 40mg /d...      March 06, 2009:  she has had a rough 6months- increased stress w/ husb in hosp now w/ endocarditis, renal insuffic & doing poorly; mother in NH; & Stephanie Alvarado was hosp 6/10 w/ stroke (recovered)... she has mult somatic complaints and is tearful in the office today- discussed & supported... c/o nerves, palpit, paresthesias, alopecia, & recent bronchitis w/ cough/ beige sputum, etc...      ~  July 17, 2009:  Hosp 6/10 for CVA ?etiology- she was seen by Stephanie Alvarado/ Stephanie Alvarado 10/10 w/ Sleep Study showed AHI= 2.6, no signif OSA, but desat to 71%- therefore nocturnal O2 at 2l/min started...  seen by DrBB 1/11 w/ CP- reflux related & Protonix incr to Bid, f/u Stephanie Alvarado pending...  otherw stable- just stressed w/ husb illness, etc...  September 21, 2009--Presents for Acute visit.  Pt c/o left side of face tingling x 6 days.  Also c/o tingling worsening in her left hand and also her lips.  She also c/o "heart skipping beats" x several days.  Has had this in past, has appointment with Neuro 4/21. Is on Plavix and Asa daily. Episodes of tingling along face on left, last  episde was in fall of last year. No speech or dysphagia. Does have intermittent blurred vision. Is very tired, w/ low energy. Has alot of stress right now. Continues to have some irregular heartbeats intermittently has not improved since metoprolol increased at last cards ov.Denies chest pain, dyspnea, orthopnea, hemoptysis, fever, n/v/d, edema, headache, ext weakness.   Preventive Screening-Counseling & Management  Alcohol-Tobacco     Smoking Status: quit  Current Medications (verified): 1)  Aspirin 81 Mg Tbec (Aspirin) .... Take 1 Tab By Mouth Once Daily.Marland KitchenMarland Kitchen 2)  Plavix 75 Mg Tabs (Clopidogrel Bisulfate) .... Take 1 Tablet By Mouth Once A Day 3)  Nitroglycerin 0.4 Mg Subl (Nitroglycerin) .... One Tablet Under Tongue Every 5 Minutes As Needed For Chest Pain---May Repeat Times Three 4)  Imdur 30 Mg Xr24h-Tab (Isosorbide Mononitrate) .... Take 1 Tablet By Mouth Once A Day 5)  Metoprolol Tartrate 50 Mg  Tabs (Metoprolol Tartrate) .... Take 2 Tabs in Am, and 1 Tab in Pm. 6)  Furosemide 40 Mg Tabs (Furosemide) .Marland Kitchen.. 1 Once Daily 7)  Zocor 80 Mg  Tabs (Simvastatin) .... Take 1 Tablet By Mouth Once A Day 8)  Protonix 40 Mg Tbec (Pantoprazole Sodium) .... Take 1 Tab By Mouth Two Times A Day 9)  Neurontin 300 Mg  Caps (Gabapentin) .... Take One Capsule By Mouth Three Times A Day 10)  Zolpidem Tartrate 10  Mg  Tabs (Zolpidem Tartrate) .... Take 1 Tab By Mouth At Bedtime As Needed 11)  Womens Multivitamin Plus  Tabs (Multiple Vitamins-Minerals) .... Take 1 Tab Daily... 12)  B Complex Vitamins  Caps (B Complex Vitamins) .... Take 1 Tab Daily... 13)  Alprazolam 0.5 Mg Tabs (Alprazolam) .... Take 1/2 To 1 Tab By Mouth Three Times A Day As Needed For Nerves...  Allergies (verified): 1)  ! Codeine  Past History:  Past Surgical History: Last updated: 07/17/2009 S/P hysterectomy S/P A/P repair and Raz urethral suspension 1993 by Stephanie Alvarado S/P cholecystectomy S/P 3 diff lumbar surgeries  Family  History: Last updated: 2009/04/29 Mother heart disease deceased  Father heart disease deceased Family History of Breast Cancer: cousions  Social History: Last updated: 04-29-09 Married, husb= Stephanie Alvarado keeps mother at home w/ severe Alhzeimer's exsmoker, quit 2000 after 40 yrs of 1+ppd... Alcohol Use - no Daily Caffeine Use Illicit Drug Use - no  Past Medical History:  1. Coronary artery disease status post prior drug-eluting stent to the     circumflex artery in 2006 and recent drug-eluting stent to left     anterior descending 2. Good left ventricular function. 3. Hypertension. 4. Hyperlipidemia. 5. Hiatal hernia and gastroesophageal reflux disease. 6. Dysphasia, possible esophageal stricture. 7. Lumbar disk disease.  8. 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 embolic ?  AF Rx Coumadin pending event monitor     Past Pulmonary History:  Pulmonary History: DYSPNEA (ICD-786.05) - exsmoker quit  ~12yrs (around 2000) after  ~40 yrs of 1+ppd smoking... she has mild underlying COPD & multifactorial dyspnea w/ severe anxiety as a major factor, plus her CAD & some GERD, etc...  ~  prev CTChest w/ mild centrilobular emphysema noted...  ~  baseline CXR w/ mild cardiomeg, COPD, NAD...  ~  PFT's 3/10 showed FVC=2.66 (77%), FEV1= 2.00 (74%), %1sec=75, mid-flows=64%pred...  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 serial CXR's do not show a lesion in this area, and we continue to follow this on CXR...  ~  CXR 3/10:  no nodule seen...  HYPERTENSION (ICD-401.9) - controlled on METOPROLOL 50mg Tid, NORVASC 5mg /d...    CORONARY ARTERY DISEASE (ICD-414.00) - on the Metoprolol, Norvasc + IMDUR 30mg /d & PLAVIX 75mg /d... followed by Stephanie Alvarado & last seen 12/09 at which time her Metoprolol was increased to 3/d...   ~  she had a stent to the RCA in 2006  ~   NuclearStressTest 1/09 was neg- no infarct or ischemia, EF= 58%, mild apical thinning noted.  ~  repeat cath Nov09 w/ stent placed in the LAD  PERIPHERAL VASCULAR DISEASE (ICD-443.9) - on PLAVIX 75mg /d as noted- she refuses to take ASA (bruising)... she had a left subclavian steal syndrome treated in 1998 by [facility] w/ a carotid to subclavian bypass operation...  VENOUS INSUFFICIENCY (ICD-459.81) - she had bilat GSV strippings in the past... she is also s/p mult stab phlebectomies in her left leg for VV by DrLawson in 2008...  HYPERCHOLESTEROLEMIA (ICD-272.0) - on SIMVASTATIN 80mg /d + FISH OIL & diet Rx...  ~  FLP 11/08 showed TChol 167, TG 113, HDL 42, LDL 102...  ~  FLP 3/10 showed TChol 169, TG 131, HDL 51, LDL 92... rec> continue same meds.     Review of Systems      See HPI  Vital Signs:  Patient profile:   70 year  old female Weight:      199 pounds O2 Sat:      95 % on Room air Temp:     99.3 degrees F oral Pulse rate:   75 / minute BP sitting:   136 / 84  (right arm) Cuff size:   large  Vitals Entered By: Vernie Murders (September 21, 2009 2:27 PM)  O2 Flow:  Room air CC: Acute visit.  Pt c/o left side of face tingling x 6 days.  Also c/o tingling worsening in her left hand and also her lips.  She also c/o "heart skipping beats" x several days.   Is Patient Diabetic? No   Physical Exam  Additional Exam:  WD, Overweight, 70 y/o WF in NAD... GENERAL:  Alert & oriented; pleasant & cooperative. HEENT:  [place]/AT,  , EACs-clear, TMs-wnl, NOSE-clear, THROAT-clear & wnl. NECK:  Supple w/ fairROM; no JVD; normal carotid impulses w/o bruits; no thyromegaly or nodules palpated; no lymphadenopathy. CHEST:  Clear to P & A; without wheezes/ rales/ or rhonchi. HEART:  Regular Rhythm; without murmurs/ rubs/ or gallops. ABDOMEN:  Soft & nontender; normal bowel sounds; no organomegaly or masses detected. EXT: without deformities, mod arthritic changes; varicose veins/ +venous insuffic/ tr  edema. tenderness along knee, no redness, neg homans sign, scattered vv/vi. mild swelling along knee,  NEURO:  CN's intact;  no focal neuro deficits...nml grips, nml gait, PERRLA, EOMI w/o nystagmus  DERM:  No lesions noted; no rash etc...     Impression & Recommendations:  Problem # 1:  CVA (ICD-434.91) Hx of CVA 6/10 , now w/ recurrent left sided facial parestesia, ?TIA will check labs  set up for MRI/MRI head -fax results to neuro follow up neuro as scheduled 4/21 , and as needed  Please contact office for sooner follow up if symptoms do not improve or worsen  advised if symptoms worsen call 911, advised on acute sym. of CVA.  cont on plavix and asa  Her updated medication list for this problem includes:    Aspirin 81 Mg Tbec (Aspirin) .Marland Kitchen... Take 1 tab by mouth once daily...    Plavix 75 Mg Tabs (Clopidogrel bisulfate) .Marland Kitchen... Take 1 tablet by mouth once a day  Orders: Cardiology Referral (Cardiology) Radiology Referral (Radiology)  Problem # 2:  CORONARY ARTERY DISEASE (ICD-414.00)  recurrent palpitations, check labs w/ tsh EKG w/o acute changes,  will refer back to cards to assist.  decrease caffiene Her updated medication list for this problem includes:    Aspirin 81 Mg Tbec (Aspirin) .Marland Kitchen... Take 1 tab by mouth once daily...    Plavix 75 Mg Tabs (Clopidogrel bisulfate) .Marland Kitchen... Take 1 tablet by mouth once a day    Nitroglycerin 0.4 Mg Subl (Nitroglycerin) ..... One tablet under tongue every 5 minutes as needed for chest pain---may repeat times three    Imdur 30 Mg Xr24h-tab (Isosorbide mononitrate) .Marland Kitchen... Take 1 tablet by mouth once a day    Metoprolol Tartrate 50 Mg Tabs (Metoprolol tartrate) .Marland Kitchen... Take 2 tabs in am, and 1 tab in pm.    Furosemide 40 Mg Tabs (Furosemide) .Marland Kitchen... 1 once daily  Orders: Cardiology Referral (Cardiology)  Complete Medication List: 1)  Aspirin 81 Mg Tbec (Aspirin) .... Take 1 tab by mouth once daily.Marland KitchenMarland Kitchen 2)  Plavix 75 Mg Tabs (Clopidogrel  bisulfate) .... Take 1 tablet by mouth once a day 3)  Nitroglycerin 0.4 Mg Subl (Nitroglycerin) .... One tablet under tongue every 5 minutes as needed for chest pain---may repeat times  three 4)  Imdur 30 Mg Xr24h-tab (Isosorbide mononitrate) .... Take 1 tablet by mouth once a day 5)  Metoprolol Tartrate 50 Mg Tabs (Metoprolol tartrate) .... Take 2 tabs in am, and 1 tab in pm. 6)  Furosemide 40 Mg Tabs (Furosemide) .Marland Kitchen.. 1 once daily 7)  Zocor 80 Mg Tabs (Simvastatin) .... Take 1 tablet by mouth once a day 8)  Protonix 40 Mg Tbec (Pantoprazole sodium) .... Take 1 tab by mouth two times a day 9)  Neurontin 300 Mg Caps (Gabapentin) .... Take one capsule by mouth three times a day 10)  Zolpidem Tartrate 10 Mg Tabs (Zolpidem tartrate) .... Take 1 tab by mouth at bedtime as needed 11)  Womens Multivitamin Plus Tabs (Multiple vitamins-minerals) .... Take 1 tab daily... 12)  B Complex Vitamins Caps (B complex vitamins) .... Take 1 tab daily... 13)  Alprazolam 0.5 Mg Tabs (Alprazolam) .... Take 1/2 to 1 tab by mouth three times a day as needed for nerves...  Other Orders: TLB-CBC Platelet - w/Differential (85025-CBCD) TLB-BMP (Basic Metabolic Panel-BMET) (80048-METABOL) TLB-TSH (Thyroid Stimulating Hormone) (84443-TSH) TLB-Udip w/ Micro (81001-URINE) T-Urine Culture (Spectrum Order) ([id number]) TLB-Hepatic/Liver Function Pnl (80076-HEPATIC)  Patient Instructions: 1)  Continue on same meds  2)  We are setting you up for MRI of brain 3)  follow up w. Neuro as scheduled on 4/21.  4)  we are setting up a follow up with cardiology to evaluate irregular heartbeats.  5)  Please contact office for sooner follow up if symptoms do not improve or worsen  6)  follow up Dr. Kriste Basque as scheduled and as needed    [facility] ECG  ID: [id number] Patient: Stephanie Alvarado, Stephanie Alvarado DOB: Apr 14, 1941 Age: 70 Years Old Sex: Female Race: White Physician: Rubye Oaks Referring: Graceann Congress PCP: Stephanie Alvarado Technician: Boone Master CNA Height: 67 Weight: 199 Status: Unconfirmed Past Medical History:   1. Coronary artery disease status post prior drug-eluting stent to the     circumflex artery in 2006 and recent drug-eluting stent to left     anterior descending 2. Good left ventricular function. 3. Hypertension. 4. Hyperlipidemia. 5. Hiatal hernia and gastroesophageal reflux disease. 6. Dysphasia, possible esophageal stricture. 7. Lumbar disk disease.  8. 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 embolic ?  AF Rx Coumadin pending event monitor 9. 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

## 2010-07-13 NOTE — Letter (Signed)
Summary: Med Link  Med Link   Imported By: Lanelle Bal 11/27/2009 13:53:13  _____________________________________________________________________  External Attachment:    Type:   Image     Comment:   External Document

## 2010-07-13 NOTE — Assessment & Plan Note (Signed)
Summary: rov   Visit Type:  Follow-up Referring Provider:  Alroy Dust, MD Primary Provider:  Alroy Dust, MD  CC:  pt feeling better today she has had some numbness..Pt has had some skips in heart beat.  History of Present Illness: Stephanie Alvarado is 70 years old returned for management of CAD. She had a drug-eluting stent placed in the circumflex artery in 2006. In 2009 she had a stent to the LAD which jailed septal perforator. Recently she has had increased symptoms of palpitations. She has also had fatigue and exhaustion with exertion and shortness of breath with exertion with decreased exercise tolerance. She had laboratory studies done in Dr. Cydney Ok office which showed a normal hemoglobin, normal TSH, and normal electrolytes and renal function.  Her other problems include hypertension and hyperlipidemia. She also has GERD and is on high-dose PPI. She has had a sleep study in the past which has been borderline and not felt bad enough to justify CPAP.  Current Medications (verified): 1)  Plavix 75 Mg Tabs (Clopidogrel Bisulfate) .... Take 1 Tablet By Mouth Once A Day 2)  Nitroglycerin 0.4 Mg Subl (Nitroglycerin) .... One Tablet Under Tongue Every 5 Minutes As Needed For Chest Pain---May Repeat Times Three 3)  Imdur 30 Mg Xr24h-Tab (Isosorbide Mononitrate) .... Take 1 Tablet By Mouth Once A Day 4)  Metoprolol Tartrate 50 Mg  Tabs (Metoprolol Tartrate) .... Take 2 Tabs in Am, and 1 Tab in Pm. 5)  Furosemide 40 Mg Tabs (Furosemide) .Marland Kitchen.. 1 Once Daily 6)  Zocor 80 Mg  Tabs (Simvastatin) .... Take 1 Tablet By Mouth Once A Day 7)  Protonix 40 Mg Tbec (Pantoprazole Sodium) .... Take 1 Tab By Mouth Two Times A Day 8)  Neurontin 300 Mg  Caps (Gabapentin) .... Take One Capsule By Mouth Three Times A Day 9)  Zolpidem Tartrate 10 Mg  Tabs (Zolpidem Tartrate) .... Take 1 Tab By Mouth At Bedtime As Needed 10)  Womens Multivitamin Plus  Tabs (Multiple Vitamins-Minerals) .... Take 1 Tab Daily... 11)  B  Complex Vitamins  Caps (B Complex Vitamins) .... Take 1 Tab Daily...  Allergies (verified): 1)  ! Codeine  Past History:  Past Medical History: Reviewed history from 09/21/2009 and no changes required.  1. Coronary artery disease status post prior drug-eluting stent to the     circumflex artery in 2006 and recent drug-eluting stent to left     anterior descending 2. Good left ventricular function. 3. Hypertension. 4. Hyperlipidemia. 5. Hiatal hernia and gastroesophageal reflux disease. 6. Dysphasia, possible esophageal stricture. 7. Lumbar disk disease.  8. 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 embolic ?  AF Rx Coumadin pending event monitor     Review of Systems       ROS is negative except as outlined in HPI.   Vital Signs:  Patient profile:   70 year old female Height:      67 inches Weight:      200 pounds BMI:     31.44 Pulse rate:   75 / minute BP sitting:   130 / 70 Cuff size:   large  Vitals Entered By: Stephanie Kanaris, CNA (Oct 12, 2009 3:22 PM)  Physical Exam  Additional Exam:  Gen. Well-nourished, in no distress   Neck: No JVD, thyroid not enlarged, no carotid bruits Lungs: No tachypnea, clear without rales, rhonchi or wheezes Cardiovascular: Rhythm regular, PMI not displaced,  heart sounds  normal, no murmurs or gallops, no peripheral edema,  pulses normal in all 4 extremities. Abdomen: BS normal, abdomen soft and non-tender without masses or organomegaly, no hepatosplenomegaly. MS: No deformities, no cyanosis or clubbing   Neuro:  No focal sns   Skin:  no lesions    Impression & Recommendations:  Problem # 1:  CHEST PAIN-PRECORDIAL (ICD-786.51) She has chest pain, shortness of breath, decreased exercise tolerance and fatigue with exertion. The symptoms could be an anginal equivalent and we plan to evaluate her with a Lexiscan Myoview scan.  The following medications were removed from the medication list:    Aspirin 81 Mg Tbec (Aspirin) .Marland Kitchen... Take 1  tab by mouth once daily... Her updated medication list for this problem includes:    Plavix 75 Mg Tabs (Clopidogrel bisulfate) .Marland Kitchen... Take 1 tablet by mouth once a day    Nitroglycerin 0.4 Mg Subl (Nitroglycerin) ..... One tablet under tongue every 5 minutes as needed for chest pain---may repeat times three    Imdur 30 Mg Xr24h-tab (Isosorbide mononitrate) .Marland Kitchen... Take 1 tablet by mouth once a day    Metoprolol Tartrate 50 Mg Tabs (Metoprolol tartrate) .Marland Kitchen... Take 2 tabs in am, and 1 tab in pm.  Orders: Nuclear Stress Test (Nuc Stress Test)  Problem # 2:  CORONARY ARTERY DISEASE (ICD-414.00) She has previous drug-eluting stents in the circumflex and LAD. She has symptoms as described above and we plan a Myoview scan. The following medications were removed from the medication list:    Aspirin 81 Mg Tbec (Aspirin) .Marland Kitchen... Take 1 tab by mouth once daily... Her updated medication list for this problem includes:    Plavix 75 Mg Tabs (Clopidogrel bisulfate) .Marland Kitchen... Take 1 tablet by mouth once a day    Nitroglycerin 0.4 Mg Subl (Nitroglycerin) ..... One tablet under tongue every 5 minutes as needed for chest pain---may repeat times three    Imdur 30 Mg Xr24h-tab (Isosorbide mononitrate) .Marland Kitchen... Take 1 tablet by mouth once a day    Metoprolol Tartrate 50 Mg Tabs (Metoprolol tartrate) .Marland Kitchen... Take 2 tabs in am, and 1 tab in pm.  Orders: Nuclear Stress Test (Nuc Stress Test)  The following medications were removed from the medication list:    Aspirin 81 Mg Tbec (Aspirin) .Marland Kitchen... Take 1 tab by mouth once daily... Her updated medication list for this problem includes:    Plavix 75 Mg Tabs (Clopidogrel bisulfate) .Marland Kitchen... Take 1 tablet by mouth once a day    Nitroglycerin 0.4 Mg Subl (Nitroglycerin) ..... One tablet under tongue every 5 minutes as needed for chest pain---may repeat times three    Imdur 30 Mg Xr24h-tab (Isosorbide mononitrate) .Marland Kitchen... Take 1 tablet by mouth once a day    Metoprolol Tartrate 50 Mg Tabs  (Metoprolol tartrate) .Marland Kitchen... Take 2 tabs in am, and 1 tab in pm.  Problem # 3:  HYPERTENSION (ICD-401.9)  This is well controlled on current medications. The following medications were removed from the medication list:    Aspirin 81 Mg Tbec (Aspirin) .Marland Kitchen... Take 1 tab by mouth once daily... Her updated medication list for this problem includes:    Metoprolol Tartrate 50 Mg Tabs (Metoprolol tartrate) .Marland Kitchen... Take 2 tabs in am, and 1 tab in pm.    Furosemide 40 Mg Tabs (Furosemide) .Marland Kitchen... 1 once daily  The following medications were removed from the medication list:    Aspirin 81 Mg Tbec (Aspirin) .Marland Kitchen... Take 1 tab by mouth once daily... Her updated medication list for this problem includes:    Metoprolol Tartrate 50 Mg  Tabs (Metoprolol tartrate) .Marland Kitchen... Take 2 tabs in am, and 1 tab in pm.    Furosemide 40 Mg Tabs (Furosemide) .Marland Kitchen... 1 once daily  Patient Instructions: 1)  Your physician wants you to follow-up in: 6 months.  You will receive a reminder letter in the mail two months in advance. If you don't receive a letter, please call our office to schedule the follow-up appointment. 2)  Your physician has requested that you have a Lexiscan myoview.  For further information please visit [email or web address]  Please follow instruction sheet, as given. 3)  Your physician recommends that you continue on your current medications as directed. Please refer to the Current Medication list given to you today. Prescriptions: NEURONTIN 300 MG  CAPS (GABAPENTIN) take one capsule by mouth at breakfast and lunch- take two capsules by mouth at bedtime  #360 x 3   Entered by:   Sherri Rad, RN, BSN   Authorized by:   Lenoria Farrier, MD, [facility]   Signed by:   Sherri Rad, RN, BSN on 10/12/2009   Method used:   Electronically to        [facility]* (mail-order)             ,          Ph: [phone]       Fax: [phone]   RxID:   [id number] PLAVIX 75 MG TABS (CLOPIDOGREL BISULFATE) Take 1  tablet by mouth once a day  #90 x 3   Entered by:   Sherri Rad, RN, BSN   Authorized by:   Lenoria Farrier, MD, [facility]   Signed by:   Sherri Rad, RN, BSN on 10/12/2009   Method used:   Electronically to        [facility]* (mail-order)             ,          Ph: [phone]       Fax: [phone]   RxID:   [id number]

## 2010-07-13 NOTE — Letter (Signed)
Summary: [facility]  [facility]   Imported By: Sherian Rein 12/17/2009 10:33:52  _____________________________________________________________________  External Attachment:    Type:   Image     Comment:   External Document

## 2010-07-13 NOTE — Assessment & Plan Note (Signed)
Summary: rov/apc   Primary Care Provider:  Alroy Dust, MD  CC:  9 month ROV & review of mult medical problems... and Back Pain.  History of Present Illness: 70 y/o WF here for a follow up visit... she has multiple medical problems including chr dyspnea which is multifactorial, CAD w/ prev PCI by DrBrodie, hx stroke w/o residual deficits, PVD w/ prev carotid to subclav bypass by DrLawson, Hyperlipidemia, Obesity, GERD, chr back pain w/ 3 surgeries, FM, & anxiety...   ~  July 17, 2009:  [place] 6/10 for CVA ?etiology- she was seen by DrSethi/ Dohme[place] 10/10 w/ Sleep Study showed AHI= 2.6, no signif OSA, but desat to 71%- therefore nocturnal O2 at 2l/min started...  seen by DrBB 1/11 w/ CP- reflux related & Protonix incr to Bid, f/u DrPatterson pending...  otherw stable- just stressed w/ husb illness, etc...   ~  April 27, 2010:  70mo ROV c/o incr SOB/ DOE she notes after walking & housework, but in fact she has had chr dyspnea as a long term complaint... notes occas sharp fleeting CP, no angina, & had neg Myoview 6/11... her weight is up 10# to 210#, too sedentary overall, and we discussed all the stress in her life... BP controlled on meds;  no angina as noted;  she had stopped her ASA & is asked to resume this med;  on Simva80 w/ LDL 123 & we will switch to Lip40;  she notes right hip pain w/ evals from DrAlusio & DrDeveshwar;  finally notes depression & we decided to treat w/ trial Lexapro 10mg /d.    Current Problems:   ALLERGIC RHINITIS (ICD-477.9) - she takes ALLEGRA 180mg /d...  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    DYSPNEA (ICD-786.05) - exsmoker quit  ~around 2000 after  ~40 yrs of 1+ppd smoking... she has mild underlying COPD/ Emphysema & multifactorial dyspnea w/ severe anxiety as a major factor, plus her  CAD & some GERD, etc...  ~  prev CTChest w/ mild centrilobular emphysema noted...  ~  baseline CXR w/ mild cardiomeg, COPD, NAD...  ~  PFT's 3/10 showed FVC=2.66 (77%), FEV1= 2.00 (74%), %1sec=75, mid-flows=64%pred >> c/w mild restriction & sm airways dis.  ~  CXR 11/11 showed mild cardiomeg, COPD/ E, calcif Ao, NAD...  ~  PFT 11/11 showed FVC= 1.95 (54%), FEV1= 1.45 (53%), %1sec= 75%, mid-flows=50%pred >> c/w mod restriction & sm airways dis.  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 serial CXR's do not show a lesion in this area, and we continue to follow this on CXR...  ~  CXR 3/10 & 11/11=  no nodule seen...  HYPERTENSION (ICD-401.9) - controlled on METOPROLOL 50mg Tid, LASIX 40mg /d... BP= 132/82 today, but not checking BP at home... takes meds regularly, tolerates well... denies HA, visual changes, CP, palipit, dizziness, syncope, edema, etc...   CORONARY ARTERY DISEASE (ICD-414.00) - on the METOPROLOL 50mg Tid, + IMDUR 30mg /d & ASA/PLAVIX 75mg /d... followed by DrBBrodie- Norvasc&Coumadin were DC'd 7/10 & Lasix/ ASA/ Plavix added...  ~  she had a stent to the RCA in 2006  ~  NuclearStressTest 1/09 was neg- no infarct or ischemia, EF= 58%, mild apical thinning noted.  ~  repeat cath Nov09 w/ stent placed in the  LAD.  ~  NuclearStressTest 5/11 showed breast attn, no ischemia, +CP w/o STTWchanges, norm wall motion & EF=57%.  PERIPHERAL VASCULAR DISEASE (ICD-443.9) - on ASA 81mg , PLAVIX 75mg /d as noted (she prev refuses to take ASA- bruising)... she had a left subclavian steal syndrome treated in 1998 by [facility] w/ a carotid to subclavian bypass operation...  ~  11/11:  f/u CDoppler is pending  VENOUS INSUFFICIENCY (ICD-459.81) - she had bilat GSV strippings in the past... she is also s/p mult stab phlebectomies in her left leg for VV by DrLawson in  2008...  HYPERCHOLESTEROLEMIA (ICD-272.0) - on SIMVASTATIN 80mg /d + FISH OIL & diet Rx...  ~  FLP 11/08 onSimva80 showed TChol 167, TG 113, HDL 42, LDL 102...  ~  FLP 3/10 on Simva80 showed TChol 169, TG 131, HDL 51, LDL 92... rec> continue same meds.  ~  FLP 11/11 on Simva80 showed TChol 201, TG 128, HDL 63, LDL 123... rec> ch to LIPITOR 40mg /d.  OBESITY (ICD-278.00) - she has been  ~210# for years... we discussed diet + exercise strategies for weight reduction...  ~  labs 3/10 showed BS= 104, A1c= 6.5  ~  weight 9/10 = 200#  ~  weight 2/11 = 199#  ~  weight 11/11 = 210#  THYROID CYST (ICD-246.2) - CTChest 6/07 shows a cystic lesion in right lobe of the thyroid (1.2cm) w/o change from prev scans...  GERD (ICD-530.81) - on PROTONIX 40mg Bid now... last EGD was 7/07 by DrPatterson w/ a 4cm HH, GERD seen- rec> PPI & reflux regimen.  DIVERTICULOSIS OF COLON (ICD-562.10) & COLONIC POLYPS (ICD-211.3) - last colonoscopy 7/07 by DrPatterson showed divertics & 4mm adenomatous polyp removed... f/u planned 5 yrs.  DEGENERATIVE JOINT DISEASE (ICD-715.90) - hx right knee pain w/ shot by Ortho, DrRamos, Feb10... and prev shots by [employer]... had right hip eval from DrAlusio.  LOW BACK PAIN, CHRONIC (ICD-724.2) - s/p 3 surgeries in the past... last = 12/04 by DrStern w/ redo laminectomy L4-5 and L5-S1 levels and three deep diskectomies w/ interbody fusion, pedicle screw fixation, & posterolateral arthrodesis...  FIBROMYALGIA (ICD-729.1) - she has seen DrDeveshwar in past and she started NEURONTIN (4 tabs daily) w/ improvement in her symptoms...  CVA (ICD-434.91) - ** see 6/10 admission ** right MCA infarct treated w/ tPA- felt to be embolic but no source found... ?patent foramen ovale on Echo, but later ruled out on bubble study... initially treated w/ Coumadin, but placed back on ASA/ PLAVIX...  ANXIETY (ICD-300.00) - she is under alot of stress w/ husb's illness & mother w/ alzheimer's so bad that  she can't leave her alone... she take AMBIEN 10mg  Qhs for rest... offered Alprazolam but she declines...  ~  11/11:  she notes incr signs of depression & wonders about an antidepressant> Rx LEXAPRO 10mg /d.  ANEMIA (ICD-285.9) - hx of anemia in the past... resolved.  ~  labs 11/09 hosp showed Hg= 13  ~  labs 3/10 showed Hg= 13.7  ~  labs 8/10 showed Hg= 13.7  ~  labs 11/11 showed Hg= 13.8    Preventive Screening-Counseling & Management  Alcohol-Tobacco     Smoking Status: quit     Year Quit: 2000     Pack years: 40+  Allergies: 1)  ! Codeine  Comments:  Nurse/Medical Assistant: The patient's medications and allergies were reviewed with the patient and were updated in the Medication and Allergy Lists.  Past History:  Past Medical History:  1. Coronary artery disease status post prior  drug-eluting stent to the     circumflex artery in 2006 and recent drug-eluting stent to left     anterior descending 2. Good left ventricular function. 3. Hypertension. 4. Hyperlipidemia. 5. Hiatal hernia and gastroesophageal reflux disease. 6. Dysphasia, possible esophageal stricture. 7. Lumbar disk disease.  8. 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 embolic ?  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) HYPERTENSION (ICD-401.9) CORONARY ARTERY DISEASE (ICD-414.00) DIASTOLIC HEART FAILURE, CHRONIC (ICD-428.32) PERIPHERAL VASCULAR DISEASE (ICD-443.9) VENOUS INSUFFICIENCY (ICD-459.81) HYPERCHOLESTEROLEMIA (ICD-272.0) OBESITY (ICD-278.00) THYROID CYST (ICD-246.2) GERD (ICD-530.81) DIVERTICULOSIS OF COLON (ICD-562.10) COLONIC POLYPS (ICD-211.3) DEGENERATIVE JOINT DISEASE (ICD-715.90) LOW BACK PAIN, CHRONIC (ICD-724.2) FIBROMYALGIA (ICD-729.1) CVA (ICD-434.91) ANXIETY (ICD-300.00) ANEMIA (ICD-285.9)  Past Surgical History: S/P  hysterectomy S/P A/P repair and Raz urethral suspension 1993 by DrNeal S/P cholecystectomy S/P 3 diff lumbar surgeries  Family History: Reviewed history from 04/21/2009 and no changes required. Mother heart disease deceased  Father heart disease deceased Family History of Breast Cancer: cousions  Social History: Reviewed history from 04/21/2009 and no changes required. Married, husb= Kaja Jackowski keeps mother at home w/ severe Alhzeimer's exsmoker, quit 2000 after 40 yrs of 1+ppd... Alcohol Use - no Daily Caffeine Use Illicit Drug Use - no  Review of Systems      See HPI       The patient complains of weight gain and dyspnea on exertion.  The patient denies anorexia, fever, weight loss, vision loss, decreased hearing, hoarseness, chest pain, syncope, peripheral edema, prolonged cough, headaches, hemoptysis, abdominal pain, melena, hematochezia, severe indigestion/heartburn, hematuria, incontinence, muscle weakness, suspicious skin lesions, transient blindness, difficulty walking, depression, unusual weight change, abnormal bleeding, enlarged lymph nodes, and angioedema.    Vital Signs:  Patient profile:   70 year old female Height:      69 inches Weight:      210.25 pounds O2 Sat:      96 % on Room air Temp:     98.2 degrees F oral Pulse rate:   63 / minute BP sitting:   132 / 82  (right arm) Cuff size:   regular  Vitals Entered By: Randell Loop CMA (April 27, 2010 2:22 PM)  O2 Sat at Rest %:  96 O2 Flow:  Room air CC: 9 month ROV & review of mult medical problems..., Back Pain Is Patient Diabetic? No Pain Assessment Patient in pain? no      Comments meds updated today with pt   Physical Exam  Additional Exam:  WD, Overweight, 70 y/o WF in NAD... GENERAL:  Alert & oriented; pleasant & cooperative. HEENT:  [place]/AT, EOM-wnl, PERRLA, EACs-clear, TMs-wnl, NOSE-clear, THROAT-clear & wnl. NECK:  Supple w/ fairROM; no JVD; normal carotid impulses w/o bruits; no  thyromegaly or nodules palpated; no lymphadenopathy. CHEST:  Clear to P & A x few scat rhonchi, without wheezes/ rales... HEART:  Regular Rhythm; without murmurs/ rubs/ or gallops. ABDOMEN:  Soft & nontender; normal bowel sounds; no organomegaly or masses detected. EXT: without deformities, mod arthritic changes; no varicose veins/ +venous insuffic/ tr edema. NEURO:  CN's intact;  no focal neuro deficits... DERM:  No lesions noted; no rash etc...    CXR  Procedure date:  04/27/2010  Findings:      CHEST - 2 VIEW Comparison: 08/08/2008   Findings: There is chronic mild cardiomegaly with chronic slight prominence of the pulmonary vascularity.  The lungs are hyperinflated  consistent with emphysema.  There are no acute infiltrates or effusions.  No discrete pulmonary nodule.   There is extensive calcification of the thoracic aorta.  Mild degenerative changes are present throughout the thoracic spine.   IMPRESSION: No acute abnormalities.  Mild chronic cardiomegaly.  Emphysema.   Read By:  Gwynn Burly,  M.D.   MISC. Report  Procedure date:  04/29/2010  Findings:      Lipid Panel (LIPID)   Cholesterol          [H]  201 mg/dL                   1-610   Triglycerides             128.0 mg/dL                 9.6-045.4   HDL                       09.81 mg/dL                 >19.14 Cholesterol LDL - Direct                             123.3 mg/dL  BMP (METABOL)   Sodium                    142 mEq/L                   135-145   Potassium                 4.3 mEq/L                   3.5-5.1   Chloride                  104 mEq/L                   96-112   Carbon Dioxide            29 mEq/L                    19-32   Glucose                   96 mg/dL                    78-29   BUN                       12 mg/dL                    5-62   Creatinine                0.8 mg/dL                   1.3-0.8   Calcium                   9.2 mg/dL                   6.5-78.4   GFR                        80.93 mL/min                >  60  Hepatic/Liver Function Panel (HEPATIC)   Total Bilirubin           0.7 mg/dL                   1.6-1.0   Direct Bilirubin          0.1 mg/dL                   9.6-0.4   Alkaline Phosphatase      78 U/L                      39-117   AST                       18 U/L                      0-37   ALT                       14 U/L                      0-35   Total Protein             6.6 g/dL                    5.4-0.9   Albumin                   3.8 g/dL                    8.1-1.9  Comments:      CBC Platelet w/Diff (CBCD)   White Cell Count          5.4 K/uL                    4.5-10.5   Red Cell Count            4.27 Mil/uL                 3.87-5.11   Hemoglobin                13.8 g/dL                   14.7-82.9   Hematocrit                40.5 %                      36.0-46.0   MCV                       94.9 fl                     78.0-100.0   Platelet Count            193.0 K/uL                  150.0-400.0   Neutrophil %              57.2 %                      43.0-77.0   Lymphocyte %              27.2 %  12.0-46.0   Monocyte %           [H]  12.3 %                      3.0-12.0   Eosinophils%              2.9 %                       0.0-5.0   Basophils %               0.4 %                       0.0-3.0  TSH (TSH)   FastTSH                   2.18 uIU/mL                 0.35-5.50   B-Type Natiuretic Peptide (BNPR)  B-Type Natriuetic Peptide                        [H]  179.5 pg/mL                 0.0-100.0   Pulmonary Function Test Date: 04/27/2010 Gender: Female  Pre-Spirometry FVC    Value: 1.95 L/min   Pred: 3.60 L/min     % Pred: 54 % FEV1    Value: 1.45 L     Pred: 2.7 L     % Pred: 53 % FEV1/FVC  Value: 75 %     Pred: 76 %     % Pred: -- % FEF 25-75  Value: 1.08 L/min   Pred: 2.15 L/min     % Pred: 50 %  Comments: PFT's c/w mod restriction (?effort) & superimposed small airways dis...  SN  Impression  & Recommendations:  Problem # 1:  DYSPNEA (ICD-786.05) This eval is similar to prev evals w/ her dyspnea likely multifactorial> ex smoker w/ some underlying COPD/ Emhysema by prev CXR/ CT and no superimposed acute changes;  PFTs w/ mod restriction- likely effort related... components from anxiety, GERD, etc... she has declined anxioytic Rx bt wants antiepressat> OK Lexapro trial... no need for bronchodil etc... needs better exercise program, iet, weight reduction as all these will improve her dyspnea... Orders: Spirometry w/Graph (94010) T-2 View CXR (71020TC)  Problem # 2:  HYPERTENSION (ICD-401.9) Controlled>  same meds. Her updated medication list for this problem includes:    Metoprolol Tartrate 50 Mg Tabs (Metoprolol tartrate) .Marland Kitchen... Take 2 tabs in am, and 1 tab in pm.    Furosemide 40 Mg Tabs (Furosemide) .Marland Kitchen... 1 once daily  Problem # 3:  CORONARY ARTERY DISEASE (ICD-414.00) Followed & managed by DrBBrodie... Her updated medication list for this problem includes:    Aspirin 81 Mg Tabs (Aspirin) .Marland Kitchen... Take 1 tab by mouth once daily...    Plavix 75 Mg Tabs (Clopidogrel bisulfate) .Marland Kitchen... Take 1 tablet by mouth once a day    Nitroglycerin 0.4 Mg Subl (Nitroglycerin) ..... One tablet under tongue every 5 minutes as needed for chest pain---may repeat times three    Imdur 30 Mg Xr24h-tab (Isosorbide mononitrate) .Marland Kitchen... Take 1 tablet by mouth once a day    Metoprolol Tartrate 50 Mg Tabs (Metoprolol tartrate) .Marland Kitchen... Take 2 tabs in am, and 1 tab in pm.    Furosemide 40 Mg Tabs (Furosemide) .Marland KitchenMarland KitchenMarland KitchenMarland Kitchen 1  once daily  Problem # 4:  DIASTOLIC HEART FAILURE, CHRONIC (ICD-428.32) Continue Lasix, sodium restriction, etc... Her updated medication list for this problem includes:    Aspirin 81 Mg Tabs (Aspirin) .Marland Kitchen... Take 1 tab by mouth once daily...    Plavix 75 Mg Tabs (Clopidogrel bisulfate) .Marland Kitchen... Take 1 tablet by mouth once a day    Metoprolol Tartrate 50 Mg Tabs (Metoprolol tartrate) .Marland Kitchen... Take 2 tabs in  am, and 1 tab in pm.    Furosemide 40 Mg Tabs (Furosemide) .Marland Kitchen... 1 once daily  Problem # 5:  PERIPHERAL VASCULAR DISEASE (ICD-443.9) She is on secondary risk factor reduction strategy>  due for f/u CDoppler- per Cards.  Problem # 6:  HYPERCHOLESTEROLEMIA (ICD-272.0) FLP is only fair on the Simva80>  needs to change & we discussed Lip40 + better diet, get wt down, etc... Her updated medication list for this problem includes:    Lipitor 40 Mg Tabs (Atorvastatin calcium) .Marland Kitchen... Take 1 tab by mouth once daily...  Problem # 7:  GERD (ICD-530.81) Continue PPI Bid... Her updated medication list for this problem includes:    Protonix 40 Mg Tbec (Pantoprazole sodium) .Marland Kitchen... Take 1 tab by mouth two times a day  Problem # 8:  DEGENERATIVE JOINT DISEASE (ICD-715.90) Continue f/u & management of DJD, LBP, FM by DrAlsuio & DrDeveshwar... Her updated medication list for this problem includes:    Aspirin 81 Mg Tabs (Aspirin) .Marland Kitchen... Take 1 tab by mouth once daily...  Problem # 9:  CVA (ICD-434.91) No signif residuals or recurrent symptoms... CDoppler is due, continue ASA/ Plavix. Her updated medication list for this problem includes:    Aspirin 81 Mg Tabs (Aspirin) .Marland Kitchen... Take 1 tab by mouth once daily...    Plavix 75 Mg Tabs (Clopidogrel bisulfate) .Marland Kitchen... Take 1 tablet by mouth once a day  Complete Medication List: 1)  Fexofenadine Hcl 180 Mg Tabs (Fexofenadine hcl) .... Take one tablet by mouth once daily 2)  Aspirin 81 Mg Tabs (Aspirin) .... Take 1 tab by mouth once daily.Marland KitchenMarland Kitchen 3)  Plavix 75 Mg Tabs (Clopidogrel bisulfate) .... Take 1 tablet by mouth once a day 4)  Nitroglycerin 0.4 Mg Subl (Nitroglycerin) .... One tablet under tongue every 5 minutes as needed for chest pain---may repeat times three 5)  Imdur 30 Mg Xr24h-tab (Isosorbide mononitrate) .... Take 1 tablet by mouth once a day 6)  Metoprolol Tartrate 50 Mg Tabs (Metoprolol tartrate) .... Take 2 tabs in am, and 1 tab in pm. 7)  Furosemide 40 Mg  Tabs (Furosemide) .Marland Kitchen.. 1 once daily 8)  Lipitor 40 Mg Tabs (Atorvastatin calcium) .... Take 1 tab by mouth once daily.Marland KitchenMarland Kitchen 9)  Protonix 40 Mg Tbec (Pantoprazole sodium) .... Take 1 tab by mouth two times a day 10)  Neurontin 300 Mg Caps (Gabapentin) .... Take one capsule by mouth at breakfast and lunch- take two capsules by mouth at bedtime 11)  Zolpidem Tartrate 10 Mg Tabs (Zolpidem tartrate) .... Take 1 tab by mouth at bedtime as needed 12)  Womens Multivitamin Plus Tabs (Multiple vitamins-minerals) .... Take 1 tab daily... 13)  B Complex Vitamins Caps (B complex vitamins) .... Take 1 tab daily... 14)  Lexapro 10 Mg Tabs (Escitalopram oxalate) .... Take 1 tab by mouth once daily...  Patient Instructions: 1)  Today we updated your med list- see below.... 2)  Be sure to restart the 81mg  aspirin daily.Marland KitchenMarland Kitchen 3)  We changed your Simvastatin 80mg  to LIPITOR 40mg /d.Marland KitchenMarland Kitchen 4)  Today we did your follow up  CXR & PFT.Marland KitchenMarland Kitchen 5)  Please return to our lab one morning this week for your FASTING blood work... then please call the "phone tree" in a few days for your results.Marland KitchenMarland Kitchen  6)  Orel, you need to get on track w/ your diet & exercise program> if you want to improve your breathing you must lose weight & exercise more reguklarly!!! 7)  Call for any questions... 8)  Please schedule a follow-up appointment in 6 months. Prescriptions: LEXAPRO 10 MG TABS (ESCITALOPRAM OXALATE) take 1 tab by mouth once daily...  #30 x 12   Entered and Authorized by:   Michele Mcalpine MD   Signed by:   Michele Mcalpine MD on 05/02/2010   Method used:   Print then Give to Patient   RxID:   [id number] LIPITOR 40 MG TABS (ATORVASTATIN CALCIUM) take 1 tab by mouth once daily...  #30 x 12   Entered and Authorized by:   Michele Mcalpine MD   Signed by:   Michele Mcalpine MD on 05/02/2010   Method used:   Print then Give to Patient   RxID:   [id number] FEXOFENADINE HCL 180 MG TABS (FEXOFENADINE HCL) take one tablet by mouth once daily  #90 x  3   Entered and Authorized by:   Michele Mcalpine MD   Signed by:   Michele Mcalpine MD on 05/02/2010   Method used:   Print then Give to Patient   RxID:   [id number] LEXAPRO 10 MG TABS (ESCITALOPRAM OXALATE) take 1 tab by mouth once daily...  #30 x 12   Entered and Authorized by:   Michele Mcalpine MD   Signed by:   Michele Mcalpine MD on 04/27/2010   Method used:   Print then Give to Patient   RxID:   [id number] LIPITOR 40 MG TABS (ATORVASTATIN CALCIUM) take 1 tab by mouth once daily...  #30 x 12   Entered and Authorized by:   Michele Mcalpine MD   Signed by:   Michele Mcalpine MD on 04/27/2010   Method used:   Print then Give to Patient   RxID:   [id number]

## 2010-07-13 NOTE — Medication Information (Signed)
Summary: Zolpidem/[facility]  Zolpidem/[facility]   Imported By: Lester  03/04/2010 10:47:09  _____________________________________________________________________  External Attachment:    Type:   Image     Comment:   External Document

## 2010-07-13 NOTE — Progress Notes (Signed)
Summary: Nuclear pre procedure  Phone Note Outgoing Call Call back at Home Phone [phone]   Call placed by: Rea College, CMA,  Oct 19, 2009 4:19 PM Call placed to: Patient Summary of Call: Attempted to call patient with [facility] information several times and the line was busy.     Nuclear Med Background Indications for Stress Test: Evaluation for Ischemia, Stent Patency   History: Heart Catheterization, Stents  History Comments: '06 Stent-Cfx; '09 Stent-LAD; h/o afib, BL sleep apnea  Symptoms: Chest Pain, DOE, Fatigue, Palpitations    Nuclear Pre-Procedure Cardiac Risk Factors: CVA, Hypertension, Lipids Height (in): 67  Nuclear Med Study Referring MD:  Dr. Pearlean Brownie

## 2010-07-13 NOTE — Letter (Signed)
Summary: New Patient letter  [facility]  [address] [place], [place] [address]   Phone: [phone]  Fax: [phone]       06/15/2009 MRN: [id number]  [facility] [address] [place], [place]  [address]  [place]  Dear Ms. Auerbach,  Welcome to the Gastroenterology Division at [facility].    You are scheduled to see Dr. Jarold Motto on 07-07-09 at 2:45p.m. on the 3rd floor at [facility], [address].  We ask that you try to arrive at our office 15 minutes prior to your appointment time to allow for check-in.  We would like you to complete the enclosed self-administered evaluation form prior to your visit and bring it with you on the day of your appointment.  We will review it with you.  Also, please bring a complete list of all your medications or, if you prefer, bring the medication bottles and we will list them.  Please bring your insurance card so that we may make a copy of it.  If your insurance requires a referral to see a specialist, please bring your referral form from your primary care physician.  Co-payments are due at the time of your visit and may be paid by cash, check or credit card.     Your office visit will consist of a consult with your physician (includes a physical exam), any laboratory testing he/she may order, scheduling of any necessary diagnostic testing (e.g. x-ray, ultrasound, CT-scan), and scheduling of a procedure (e.g. Endoscopy, Colonoscopy) if required.  Please allow enough time on your schedule to allow for any/all of these possibilities.    If you cannot keep your appointment, please call [phone] to cancel or reschedule prior to your appointment date.  This allows [place] the opportunity to schedule an appointment for another patient in need of care.  If you do not cancel or reschedule by 5 p.m. the business day prior to your appointment date, you will be charged a $50.00 late cancellation/no-show fee.    Thank you for  choosing [facility] for your medical needs.  We appreciate the opportunity to care for you.  Please visit [place] at our website  to learn more about our practice.                     Sincerely,                                                             The Gastroenterology Division

## 2010-07-13 NOTE — Assessment & Plan Note (Signed)
Summary: f81m    Visit Type:  6 months follow up  CC:  Sob and chest pains.  Current Medications (verified): 1)  Aspirin 81 Mg Tabs (Aspirin) .... Take 1 Tab By Mouth Once Daily.Marland KitchenMarland Kitchen 2)  Plavix 75 Mg Tabs (Clopidogrel Bisulfate) .... Take 1 Tablet By Mouth Once A Day 3)  Nitroglycerin 0.4 Mg Subl (Nitroglycerin) .... One Tablet Under Tongue Every 5 Minutes As Needed For Chest Pain---May Repeat Times Three 4)  Imdur 30 Mg Xr24h-Tab (Isosorbide Mononitrate) .... Take 1 Tablet By Mouth Once A Day 5)  Metoprolol Tartrate 50 Mg  Tabs (Metoprolol Tartrate) .... Take 2 Tabs in Am, and 1 Tab in Pm. 6)  Furosemide 40 Mg Tabs (Furosemide) .Marland Kitchen.. 1 Once Daily 7)  Lipitor 40 Mg Tabs (Atorvastatin Calcium) .... Take 1 Tab By Mouth Once Daily.Marland KitchenMarland Kitchen 8)  Protonix 40 Mg Tbec (Pantoprazole Sodium) .... Take 1 Tab By Mouth Two Times A Day 9)  Neurontin 300 Mg  Caps (Gabapentin) .... Take One Capsule By Mouth At Breakfast and Lunch- Take Two Capsules By Mouth At Bedtime 10)  Zolpidem Tartrate 10 Mg  Tabs (Zolpidem Tartrate) .... Take 1 Tab By Mouth At Bedtime As Needed 11)  Womens Multivitamin Plus  Tabs (Multiple Vitamins-Minerals) .... Take 1 Tab Daily... 12)  B Complex Vitamins  Caps (B Complex Vitamins) .... Take 1 Tab Daily... 13)  Lexapro 10 Mg Tabs (Escitalopram Oxalate) .... Take 1 Tab By Mouth Once Daily...  Allergies: 1)  ! Codeine  Past History:  Past Medical History: Last updated: 09/21/2009  1. Coronary artery disease status post prior drug-eluting stent to the     circumflex artery in 2006 and recent drug-eluting stent to left     anterior descending 2. Good left ventricular function. 3. Hypertension. 4. Hyperlipidemia. 5. Hiatal hernia and gastroesophageal reflux disease. 6. Dysphasia, possible esophageal stricture. 7. Lumbar disk disease.  8. 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 embolic ?  AF Rx Coumadin pending event monitor     Vital Signs:  Patient profile:   70 year old  female Height:      69 inches Weight:      209 pounds BMI:     30.98 Pulse rate:   57 / minute Pulse rhythm:   regular Resp:     18 per minute BP sitting:   150 / 90  (left arm) Cuff size:   large  Vitals Entered By: Vikki Ports (April 29, 2010 2:15 PM)   Other Orders: Carotid Duplex (Carotid Duplex)  Patient Instructions: 1)  Your physician has requested that you have a carotid duplex. This test is an ultrasound of the carotid arteries in your neck. It looks at blood flow through these arteries that supply the brain with blood. Allow one hour for this exam. There are no restrictions or special instructions. 2)  Your physician wants you to follow-up in: 6 months with Dr. Myrtis Ser in the [place] office.  You will receive a reminder letter in the mail two months in advance. If you don't receive a letter, please call our office to schedule the follow-up appointment. 3)  Your physician recommends that you continue on your current medications as directed. Please refer to the Current Medication list given to you today.

## 2010-07-13 NOTE — Assessment & Plan Note (Signed)
Summary: rov/apc   Primary Care Provider:  Alroy Dust, MD  CC:  4 month ROV & review of mult medical problems....  History of Present Illness: 70 y/o WF here for a yearly follow up visit... she has multiple medical problems as noted below...     ~  August 08, 2008:  here for yearly check up and refill of meds... we spent along time reviewing her medical problems and discussing her medications... she is c/o a URI w/ sore throat, cough, & some DOE... but she's been under considerable stress w/ her mother's Alzh dis, etc...    ~  [facility] 6/10 by Neuro w/ right MCA infarct- Rx w/ tPA, no source found... ?patent foramen- later ruled out w/ neg bubble study per pt hx... known CAD, ASPVD, norm LVF- disch on Coumadin but switched back to ASA/ PLAVIX by DrBBrodie 7/10... he also stopped Norvasc & added LASIX 40mg /d...   ~  March 06, 2009:  she has had a rough 6months- increased stress w/ husb in hosp now w/ endocarditis, renal insuffic & doing poorly; mother in NH; & [place]le was hosp 6/10 w/ stroke (recovered)... she has mult somatic complaints and is tearful in the office today- discussed & supported... c/o nerves, palpit, paresthesias, alopecia, & recent bronchitis w/ cough/ beige sputum, etc...  we discussed Rx w/ ZPak & Lexapro to help w/ anxiety & depression...   ~  July 17, 2009:  [place] 6/10 for CVA ?etiology- she was seen by DrSethi/ Dohmeier 10/10 w/ Sleep Study showed AHI= 2.6, no signif OSA, but desat to 71%- therefore nocturnal O2 at 2l/min started...  seen by DrBB 1/11 w/ CP- reflux related & Protonix incr to Bid, f/u DrPatterson pending...  otherw stable- just stressed w/ husb illness, etc...    Current Problems:   ALLERGIC RHINITIS (ICD-477.9) - she takes ALLEGRA 180mg /d...  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    DYSPNEA (ICD-786.05) - exsmoker quit  ~around 2000 after  ~40 yrs of 1+ppd smoking... she has mild underlying COPD & multifactorial dyspnea w/ severe anxiety as a major factor, plus her CAD & some GERD, etc...  ~  prev CTChest w/ mild centrilobular emphysema noted...  ~  baseline CXR w/ mild cardiomeg, COPD, NAD...  ~  PFT's 3/10 showed FVC=2.66 (77%), FEV1= 2.00 (74%), %1sec=75, mid-flows=64%pred...  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 serial CXR's do not show a lesion in this area, and we continue to follow this on CXR...  ~  CXR 3/10:  no nodule seen...  HYPERTENSION (ICD-401.9) - controlled on METOPROLOL 50mg Tid, LASIX 40mg /d... BP= 140/64 today, but not checking BP at home... takes meds regularly, tolerates well... denies HA, visual changes, CP, palipit, dizziness, syncope, dyspnea, edema, etc...   CORONARY ARTERY DISEASE (ICD-414.00) - on the METOPROLOL 50mg Tid, + IMDUR 30mg /d & ASA/PLAVIX 75mg /d... followed by DrBBrodie- Norvasc&Coumadin were DC'd 7/10 & Lasix/ ASA/ Plavix added...  ~  she had a stent to the RCA in 2006  ~  NuclearStressTest 1/09 was neg- no infarct or ischemia, EF= 58%, mild apical thinning noted.  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 she had a left subclavian steal syndrome treated in 1998 by [facility] w/ a carotid to subclavian bypass operation...  VENOUS INSUFFICIENCY (ICD-459.81) - she had bilat GSV strippings in the past... she is also s/p mult stab phlebectomies in her left leg for VV by DrLawson in 2008...  HYPERCHOLESTEROLEMIA (ICD-272.0) - on SIMVASTATIN 80mg /d + FISH OIL & diet Rx...  ~  FLP 11/08 showed TChol 167, TG 113, HDL 42, LDL 102...  ~  FLP 3/10 showed TChol 169, TG  131, HDL 51, LDL 92... rec> continue same meds.  OBESITY (ICD-278.00) - she has been  ~210# for years... we discussed diet + exercise strategies for weight reduction...  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  GERD (ICD-530.81) - on PROTONIX 40mg Bid now... last EGD was 7/07 by DrPatterson w/ a 4cm HH, GERD seen- rec> PPI & reflux regimen.  DIVERTICULOSIS OF COLON (ICD-562.10) & COLONIC POLYPS (ICD-211.3) - last colonoscopy 7/07 by DrPatterson showed divertics & 4mm adenomatous polyp removed... f/u planned 5 yrs.  DEGENERATIVE JOINT DISEASE (ICD-715.90) - hx right knee pain w/ shot by Ortho, DrRamos, Feb10... and prev shots by [employer].  LOW BACK PAIN, CHRONIC (ICD-724.2) - s/p 3 surgeries in the past... last = 12/04 by DrStern w/ redo laminectomy L4-5 and L5-S1 levels and three deep diskectomies w/ interbody fusion, pedicle screw fixation, & posterolateral arthrodesis...  FIBROMYALGIA (ICD-729.1) - she has seen DrDeveshwar in past and she started NEURONTIN w/ improvement in her symptoms... currently taking 300mg Tid and she wants to continue this medication...  CVA (ICD-434.91) - ** see 6/10 admission ** right MCA infarct treated w/ tPA- felt to be embolic but no source found... ?patent foramen ovale on Echo, but later ruled out on bubble study... initially treated w/ Coumadin, but placed back on ASA/ PLAVIX...  ANXIETY (ICD-300.00) - she is under alot of stress w/ husb's illness (Parkinson's etc) & mother w/ alzheimer's so bad that she can't leave her alone... she take AMBIEN 10mg  Qhs for rest.  ANEMIA (ICD-285.9) - hx of anemia in the past... resolved.  ~  labs 11/09 hosp showed Hg= 13  ~  labs 3/10 showed Hg= 13.7  ~  labs 8/10 showed Hg= 13.7    Allergies: 1)  ! Codeine  Past History:  Past Medical  History:  1. Coronary artery disease status post prior drug-eluting stent to the     circumflex artery in 2006 and recent drug-eluting stent to left     anterior descending 2. Good left ventricular function. 3. Hypertension. 4. Hyperlipidemia. 5. Hiatal hernia and gastroesophageal reflux disease. 6. Dysphasia, possible esophageal stricture. 7. Lumbar disk disease.  8. 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 embolic ?  AF Rx Coumadin pending event monitor 9. 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 Mother heart disease deceased  Father heart disease deceased Family History of Breast Cancer: cousions  Social History: Reviewed history from 04/21/2009 and no changes required. Married, husb= See [place]har[place] keeps mother at home w/ severe Alhzeimer's exsmoker, quit 2000 after 40 yrs of 1+ppd... Alcohol Use - no Daily Caffeine Use Illicit Drug Use - no  Review of Systems      See HPI       The patient complains of dyspnea on exertion, muscle weakness, and difficulty walking.  The patient denies anorexia, fever, weight loss, weight  gain, vision loss, decreased hearing, hoarseness, chest pain, syncope, peripheral edema, prolonged cough, headaches, hemoptysis, abdominal pain, melena, hematochezia, severe indigestion/heartburn, hematuria, incontinence, suspicious skin lesions, transient blindness, depression, unusual weight change, abnormal bleeding, enlarged lymph nodes, and angioedema.    Vital Signs:  Patient profile:   70 year old female Height:      67 inches Weight:      199 pounds O2 Sat:      97 % on Room air Temp:     97.0 degrees F oral Pulse rate:   57 / minute BP sitting:   140 / 64  (right arm) Cuff size:   regular  Vitals Entered By: Randell Loop CMA (July 17, 2009 11:29 AM)  O2 Sat at Rest %:  97 O2 Flow:  Room air CC: 4 month ROV & review of mult medical problems... Comments meds updated today   Physical Exam  Additional Exam:  WD, Overweight, 70 y/o WF in NAD... GENERAL:  Alert & oriented; pleasant & cooperative. HEENT:  [place]/AT, EOM-wnl, PERRLA, EACs-clear, TMs-wnl, NOSE-clear, THROAT-clear & wnl. NECK:  Supple w/ fairROM; no JVD; normal carotid impulses w/o bruits; no thyromegaly or nodules palpated; no lymphadenopathy. CHEST:  Clear to P & A x few scat rhonchi, without wheezes/ rales... HEART:  Regular Rhythm; without murmurs/ rubs/ or gallops. ABDOMEN:  Soft & nontender; normal bowel sounds; no organomegaly or masses detected. EXT: without deformities, mod arthritic changes; no varicose veins/ +venous insuffic/ tr edema. NEURO:  CN's intact;  no focal neuro deficits... DERM:  No lesions noted; no rash etc...     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  Problem # 3:  HYPERTENSION (ICD-401.9) Controlled-  same meds. Her updated medication list for this problem includes:    Metoprolol  Tartrate 50 Mg Tabs (Metoprolol tartrate) .Marland Kitchen... Take 2 tabs in am, and 1 tab in pm.    Furosemide 40 Mg Tabs (Furosemide) .Marland Kitchen... 1 once daily  Problem # 4:  CORONARY ARTERY DISEASE (ICD-414.00) Followed by drBB & stable... Her updated medication list for this problem includes:    Aspirin 81 Mg Tbec (Aspirin) .Marland Kitchen... Take 1 tab by mouth once daily...    Plavix 75 Mg Tabs (Clopidogrel bisulfate) .Marland Kitchen... Take 1 tablet by mouth once a day    Nitroglycerin 0.4 Mg Subl (Nitroglycerin) ..... One tablet under tongue every 5 minutes as needed for chest pain---may repeat times three    Imdur 30 Mg Xr24h-tab (Isosorbide mononitrate) .Marland Kitchen... Take 1 tablet by mouth once a day    Metoprolol Tartrate 50 Mg Tabs (Metoprolol tartrate) .Marland Kitchen... Take 2 tabs in am, and 1 tab  in pm.    Furosemide 40 Mg Tabs (Furosemide) .Marland Kitchen... 1 once daily  Problem # 5:  DIASTOLIC HEART FAILURE, CHRONIC (ICD-428.32) Stable-  same meds. Her updated medication list for this problem includes:    Aspirin 81 Mg Tbec (Aspirin) .Marland Kitchen... Take 1 tab by mouth once daily...    Plavix 75 Mg Tabs (Clopidogrel bisulfate) .Marland Kitchen... Take 1 tablet by mouth once a day    Metoprolol Tartrate 50 Mg Tabs (Metoprolol tartrate) .Marland Kitchen... Take 2 tabs in am, and 1 tab in pm.    Furosemide 40 Mg Tabs (Furosemide) .Marland Kitchen... 1 once daily  Problem # 6:  PERIPHERAL VASCULAR DISEASE (ICD-443.9) As noted-  stable on ASA/ PLAVIX...  Problem # 7:  HYPERCHOLESTEROLEMIA (ICD-272.0) Continue Simva80, Fish Oil, etc... Her updated medication list for this problem includes:    Zocor 80 Mg Tabs (Simvastatin) .Marland Kitchen... Take 1 tablet by mouth once a day  Problem # 8:  OBESITY (ICD-278.00) Diet + exercise are the keys...  Problem # 9:  DEGENERATIVE JOINT DISEASE (ICD-715.90) Stable-  she has DJD, LBP, FM & persist w/ mult somatic complaints Her updated medication list for this problem includes:    Aspirin 81 Mg Tbec (Aspirin) .Marland Kitchen... Take 1 tab by mouth once daily...  Problem # 10:  CVA  (ICD-434.91) Continue ASA, Plavix... Her updated medication list for this problem includes:    Aspirin 81 Mg Tbec (Aspirin) .Marland Kitchen... Take 1 tab by mouth once daily...    Plavix 75 Mg Tabs (Clopidogrel bisulfate) .Marland Kitchen... Take 1 tablet by mouth once a day  Problem # 11:  ANXIETY (ICD-300.00) We discussed Alpraz dosing for her anxiety... Her updated medication list for this problem includes:    Alprazolam 0.5 Mg Tabs (Alprazolam) .Marland Kitchen... Take 1/2 to 1 tab by mouth three times a day as needed for nerves...  Complete Medication List: 1)  Aspirin 81 Mg Tbec (Aspirin) .... Take 1 tab by mouth once daily.Marland KitchenMarland Kitchen 2)  Plavix 75 Mg Tabs (Clopidogrel bisulfate) .... Take 1 tablet by mouth once a day 3)  Nitroglycerin 0.4 Mg Subl (Nitroglycerin) .... One tablet under tongue every 5 minutes as needed for chest pain---may repeat times three 4)  Imdur 30 Mg Xr24h-tab (Isosorbide mononitrate) .... Take 1 tablet by mouth once a day 5)  Metoprolol Tartrate 50 Mg Tabs (Metoprolol tartrate) .... Take 2 tabs in am, and 1 tab in pm. 6)  Furosemide 40 Mg Tabs (Furosemide) .Marland Kitchen.. 1 once daily 7)  Zocor 80 Mg Tabs (Simvastatin) .... Take 1 tablet by mouth once a day 8)  Protonix 40 Mg Tbec (Pantoprazole sodium) .... Take 1 tab by mouth two times a day 9)  Neurontin 300 Mg Caps (Gabapentin) .... Take one capsule by mouth three times a day 10)  Zolpidem Tartrate 10 Mg Tabs (Zolpidem tartrate) .... Take 1 tab by mouth at bedtime as needed 11)  Womens Multivitamin Plus Tabs (Multiple vitamins-minerals) .... Take 1 tab daily... 12)  B Complex Vitamins Caps (B complex vitamins) .... Take 1 tab daily... 13)  Alprazolam 0.5 Mg Tabs (Alprazolam) .... Take 1/2 to 1 tab by mouth three times a day as needed for nerves...  Other Orders: Prescription Created Electronically [phone])  Patient Instructions: 1)  Today we updated your med list- see below.... 2)  We refilled your meds per request... 3)  We also wrote a new Rx for ALPRAZOLAM -  try 1/2 to 1 tab up to three times daily as needed for nerves... 4)  Call for any  problems.Marland KitchenMarland Kitchen 5)  Please schedule a follow-up appointment in4-6 months, sooner as needed. Prescriptions: ALPRAZOLAM 0.5 MG TABS (ALPRAZOLAM) take 1/2 to 1 tab by mouth three times a day as needed for nerves...  #100 x 5   Entered and Authorized by:   Michele Mcalpine MD   Signed by:   Michele Mcalpine MD on 07/17/2009   Method used:   Print then Give to Patient   RxID:   [id number] PLAVIX 75 MG TABS (CLOPIDOGREL BISULFATE) Take 1 tablet by mouth once a day  #90 x 4   Entered and Authorized by:   Michele Mcalpine MD   Signed by:   Michele Mcalpine MD on 07/17/2009   Method used:   Print then Give to Patient   RxID:   [id number] PROTONIX 40 MG TBEC (PANTOPRAZOLE SODIUM) take 1 tab by mouth two times a day  #180 x 4   Entered and Authorized by:   Michele Mcalpine MD   Signed by:   Michele Mcalpine MD on 07/17/2009   Method used:   Print then Give to Patient   RxID:   [id number]

## 2010-07-13 NOTE — Progress Notes (Signed)
Summary: RX REQUEST-CONGESTION  Phone Note Call from Patient   Caller: Patient Call For: NADEL Summary of Call: PT C/O CONGESTION W/ COUGH-GREEN PHLEGM SINCE "LAST NIGHT". NO FEVER. HAS MUSINEX BUT HASN'T TAKEN YET. PT WANTS A RX CALLED IN- [facility] IN [place]. PT CELL [phone] Initial call taken by: Tivis Ringer, CNA,  May 05, 2010 9:03 AM  Follow-up for Phone Call        Pt c/o productive cough with green phlegm, chest congestion, weakness, body aches x 2 days. Pt denies fever. Pt requesting rx. Please advsie.Carron Curie CMA  May 05, 2010 9:34 AM allergies: codeine  Additional Follow-up for Phone Call Additional follow up Details #1::        per SN---ok for pt to have doxycycline 100mg    #20  1 by mouth two times a day until gone. thanks Randell Loop CMA  May 05, 2010 11:35 AM   rx sent pt aware.Carron Curie CMA  May 05, 2010 11:40 AM     New/Updated Medications: DOXYCYCLINE HYCLATE 100 MG TABS (DOXYCYCLINE HYCLATE) Take 1 tablet by mouth two times a day Prescriptions: DOXYCYCLINE HYCLATE 100 MG TABS (DOXYCYCLINE HYCLATE) Take 1 tablet by mouth two times a day  #20 x 0   Entered by:   Carron Curie CMA   Authorized by:   Michele Mcalpine MD   Signed by:   Carron Curie CMA on 05/05/2010   Method used:   Electronically to        [facility]  [address] (retail)       [address]       [place], [place]  [address]       Ph: [phone]       Fax: [phone]   RxID:   [id number]

## 2010-07-13 NOTE — Miscellaneous (Signed)
  Clinical Lists Changes  Observations: Added new observation of NUCLEAR NOS: QPS  Raw Data Images:  Mild breast attenuation.  Normal left ventricular size. Stress Images:  There is decreased uptake in the anterior wall. Rest Images:  There is decreased uptake in the anterior wall slightly less prominent compared to the stress images but felt not to be clinically significant. Subtraction (SDS):  There is a fixed anterior defect that is most consistent with breast attenuation. Transient Ischemic Dilatation:  1.1  (Normal <1.22)  Lung/Heart Ratio:  .29  (Normal <0.45)  Quantitative Gated Spect Images  QGS EDV:  117 ml QGS ESV:  50 ml QGS EF:  57 % QGS cine images:  Normal wall motion.   Overall Impression   Exercise Capacity: Lexiscan study with no exercise. BP Response: Normal blood pressure response. Clinical Symptoms: There is chest pain ECG Impression: No significant ST segment change suggestive of ischemia. Overall Impression: Probably normal lexiscan nuclear study with breast attenuation but no ischemia.    Signed by Ferman Hamming, MD, [facility] on 10/20/2009 at 4:39 PM  Signed by Lenoria Farrier, MD, FACC on 10/27/2009 at 10:19 PM ______________________________________________________   (10/20/2009 15:33)      Nuclear Study  Procedure date:  10/20/2009  Findings:      QPS  Raw Data Images:  Mild breast attenuation.  Normal left ventricular size. Stress Images:  There is decreased uptake in the anterior wall. Rest Images:  There is decreased uptake in the anterior wall slightly less prominent compared to the stress images but felt not to be clinically significant. Subtraction (SDS):  There is a fixed anterior defect that is most consistent with breast attenuation. Transient Ischemic Dilatation:  1.1  (Normal <1.22)  Lung/Heart Ratio:  .29  (Normal <0.45)  Quantitative Gated Spect Images  QGS EDV:  117 ml QGS ESV:  50 ml QGS EF:  57 % QGS cine  images:  Normal wall motion.   Overall Impression   Exercise Capacity: Lexiscan study with no exercise. BP Response: Normal blood pressure response. Clinical Symptoms: There is chest pain ECG Impression: No significant ST segment change suggestive of ischemia. Overall Impression: Probably normal lexiscan nuclear study with breast attenuation but no ischemia.    Signed by Ferman Hamming, MD, [facility] on 10/20/2009 at 4:39 PM  Signed by Lenoria Farrier, MD, [facility] on 10/27/2009 at 10:19 PM ______________________________________________________

## 2010-07-13 NOTE — Progress Notes (Signed)
Summary: order requests LINE BUSY X 2  Phone Note From Other Clinic   Caller: carol spinks- NP  Call For: Codylee Patil Summary of Call: caller says that pt has c/o SOB on min. exertion x 2 months. caller says she has faxed req for orders PFT and CXR but never got a response. call pt with appts as caller is going on vacation. carol spinks # [phone] (caller is aware that sn is on vacation- she req that another dr addresses this) Initial call taken by: Tivis Ringer, CNA,  December 25, 2009 2:41 PM  Follow-up for Phone Call        Marliss Czar, have you seen order requests for PFT and CXR on this pt?  Gweneth Dimitri RN  December 25, 2009 2:46 PM   no i have been through all of SN papers and no orders were found.  sorry. Randell Loop [facility]  December 25, 2009 3:33 PM   Additional Follow-up for Phone Call Additional follow up Details #1::        [facility] Gweneth Dimitri RN  December 25, 2009 3:41 PM]   [email or web address]---will have pt call the office tomorrow Randell Loop [facility]  December 28, 2009 3:52 PM   pt returned call to [facility]. Additional Follow-up by: Eugene Gavia,  December 29, 2009 11:05 AM    Additional Follow-up for Phone Call Additional follow up Details #2::    [facility] pt- needs appt sched- Line was busy x 2, WCB Vernie Murders  December 29, 2009 11:08 AM   Spoke with pt.  She states that she has been having increased SOB x several wks.  She states that she saw Guy Sandifer, NP a she was advised needed to have PFT's done.  I advised that she needs appt asap with [place] if having problems.  Offered ov with MW for today, she refused stating that she could not get a ride here today.  Will call back to sched appt once she knows when she can get transportation. Follow-up by: Vernie Murders,  December 29, 2009 11:41 AM

## 2010-07-13 NOTE — Progress Notes (Signed)
Summary: congestion-ATC line busy  Phone Note Call from Patient   Caller: Patient Call For: nadel Summary of Call: coughing up green sputum [facility] Initial call taken by: Rickard Patience,  July 06, 2009 1:11 PM  Follow-up for Phone Call        ATC. Line busy. will retry later.Carron Curie CMA  July 06, 2009 2:47 PM  atc line busy. will retry. Carron Curie CMA  July 06, 2009 3:02 PM Line busy still Lynne Logan  July 06, 2009 3:13 PM  line still busy. Carron Curie CMA  July 06, 2009 3:48 PM   Additional Follow-up for Phone Call Additional follow up Details #1::        Spoke with pt.  She c/o prod cough with green sputum since 1/19.  Was given a zpack and finished this yesterday.  She is also taking mucinex two times a day. She states that she feels no relief.  Please advise thanks allergic to codiene Additional Follow-up by: Vernie Murders,  July 06, 2009 4:51 PM    Additional Follow-up for Phone Call Additional follow up Details #2::    per SN---ok for pt to have avelox 400mg   #7 1 by mouth once daily and continue the mucinex max 1 by mouth two times a day with plenty of fluids. thanks Randell Loop CMA  July 07, 2009 8:44 AM   called and spoke with pt. pt aware.  rx sent to pharmacy  Arman Filter LPN  July 07, 2009 8:57 AM   New/Updated Medications: AVELOX 400 MG TABS (MOXIFLOXACIN HCL) Take 1 tablet by mouth once a day for 7 days Prescriptions: AVELOX 400 MG TABS (MOXIFLOXACIN HCL) Take 1 tablet by mouth once a day for 7 days  #7 x 0   Entered by:   Arman Filter LPN   Authorized by:   Michele Mcalpine MD   Signed by:   Arman Filter LPN on 16/03/9603   Method used:   Electronically to        [facility]  [phone]* (retail)       [address]       [place], [place]  [address]       Ph: [phone] or [phone]       Fax: [phone]   RxID:   [id number]

## 2010-07-13 NOTE — Letter (Signed)
Summary: [facility] Home Visit Report  [facility] Home Visit Report   Imported By: Roderic Ovens 07/07/2009 13:07:22  _____________________________________________________________________  External Attachment:    Type:   Image     Comment:   External Document

## 2010-07-13 NOTE — Progress Notes (Signed)
Summary: returning call  Phone Note Call from Patient Call back at Home Phone [phone] Call back at [phone]   Caller: Patient Reason for Call: Talk to Nurse Summary of Call: returning call Initial call taken by: Migdalia Dk,  Nov 04, 2009 8:11 AM  Follow-up for Phone Call        Spoke with pt. stress test results given. Pt. verbalized understanding. Follow-up by: Ollen Gross, RN, BSN,  Nov 04, 2009 9:01 AM

## 2010-07-13 NOTE — Letter (Signed)
Summary: [facility]  [facility]   Imported By: Sherian Rein 04/16/2010 08:30:35  _____________________________________________________________________  External Attachment:    Type:   Image     Comment:   External Document

## 2010-07-13 NOTE — Cardiovascular Report (Signed)
Summary: Outpatient Coinsurance Notice   Outpatient Coinsurance Notice   Imported By: Roderic Ovens 10/26/2009 12:25:20  _____________________________________________________________________  External Attachment:    Type:   Image     Comment:   External Document

## 2010-07-13 NOTE — Progress Notes (Signed)
Summary: refill  Phone Note Refill Request Message from:  Patient on May 03, 2010 2:52 PM  Refills Requested: Medication #1:  FUROSEMIDE 40 MG TABS 1 once daily [facility] [phone] 90day supply  Initial call taken by: Judie Grieve,  May 03, 2010 2:53 PM    Prescriptions: FUROSEMIDE 40 MG TABS (FUROSEMIDE) 1 once daily  #90 x 4   Entered by:   Kem Parkinson   Authorized by:   Lenoria Farrier, MD, [facility]   Signed by:   Kem Parkinson on 05/03/2010   Method used:   Faxed to ...       [facility] (mail-order)             , [place]         Ph: [phone]       Fax: [phone]   RxID:   [id number]

## 2010-07-13 NOTE — Assessment & Plan Note (Signed)
Summary: Cardiology Nuclear Study  Nuclear Med Background Indications for Stress Test: Stent Patency   History: Heart Catheterization, Stents  History Comments: '06 Stent-Cfx; '09 Stent-LAD; h/o afib, BL sleep apnea  Symptoms: Chest Pain, DOE, Fatigue, Palpitations    Nuclear Pre-Procedure Cardiac Risk Factors: CVA, Hypertension, Lipids Caffeine/Decaff Intake: None NPO After: 9:00 PM Lungs: clear IV 0.9% NS with Angio Cath: 18g     IV Site: (R) Forearm IV Started by: Stanton Kidney EMT-P Chest Size (in) 42     Cup Size DD     Height (in): 69 Weight (lb): 200 BMI: 29.64  Nuclear Med Study 1 or 2 day study:  1 day     Stress Test Type:  Eugenie Birks Reading MD:  Olga Millers, MD     Referring MD:  Dr. Pearlean Brownie Resting Radionuclide:  Technetium 39m Tetrofosmin     Resting Radionuclide Dose:  11 mCi  Stress Radionuclide:  Technetium 36m Tetrofosmin     Stress Radionuclide Dose:  33 mCi   Stress Protocol   Lexiscan: 0.4 mg   Stress Test Technologist:  Milana Na EMT-P     Nuclear Technologist:  Domenic Polite CNMT  Rest Procedure  Myocardial perfusion imaging was performed at rest 45 minutes following the intravenous administration of Myoview Technetium 76m Tetrofosmin.  Stress Procedure  The patient received IV Lexiscan 0.4 mg over 15-seconds.  Myoview injected at 30-seconds.  There were no significant changes, + sob, chest heaviness, extreme fatigue, and occ pvcs with infusion.  Quantitative spect images were obtained after a 45 minute delay.  QPS Raw Data Images:  Mild breast attenuation.  Normal left ventricular size. Stress Images:  There is decreased uptake in the anterior wall. Rest Images:  There is decreased uptake in the anterior wall slightly less prominent compared to the stress images but felt not to be clinically significant. Subtraction (SDS):  There is a fixed anterior defect that is most consistent with breast attenuation. Transient Ischemic Dilatation:   1.1  (Normal <1.22)  Lung/Heart Ratio:  .29  (Normal <0.45)  Quantitative Gated Spect Images QGS EDV:  117 ml QGS ESV:  50 ml QGS EF:  57 % QGS cine images:  Normal wall motion.   Overall Impression  Exercise Capacity: Lexiscan study with no exercise. BP Response: Normal blood pressure response. Clinical Symptoms: There is chest pain ECG Impression: No significant ST segment change suggestive of ischemia. Overall Impression: Probably normal lexiscan nuclear study with breast attenuation but no ischemia.  Appended Document: Cardiology Nuclear Study Attempt to call pt- no answer x 10 rings. I will call back.  Appended Document: Cardiology Nuclear [facility].  Appended Document: Cardiology Nuclear [facility].  Appended Document: Cardiology Nuclear [facility].  Appended Document: Cardiology Nuclear Study Pt. aware of strss test results.

## 2010-07-15 NOTE — Assessment & Plan Note (Signed)
Summary: rev/cb   History of Present Illness Visit Type: Follow-up Visit Primary GI MD: Sheryn Bison MD FACP FAGA Primary Provider: Alroy Dust, MD Requesting Provider: na Chief Complaint: Lower abd pain History of Present Illness:   Patient is a 70 year old female with multiple medical problems, on Plavix, known to Dr. Jarold Motto for history of colon polyps and diverticulosis. Patient was last seen here November 2010 for LLQ pain. She was treated empirically for diverticulitis. CTscan returned as normal but patient responded nicely to Flagyl and Cipro. She did well for a year but a week or so prior to Christmas she developed recurrent LLQ pain, "stabbing".  Pain unrelated to meals or defecation. It is worse when moving around. Laying flat and heating pad help. She actually started to improve yesterday.  Bowel movements normal, takes stool softeners. No fevers. No nausea. No urinary symptoms. Same exact pain as before.    Has chronic constipation. Takes one stool softener daily with good results.    GI Review of Systems    Reports abdominal pain.     Location of  Abdominal pain: lower abdomen.    Denies acid reflux, belching, bloating, chest pain, dysphagia with liquids, dysphagia with solids, heartburn, loss of appetite, nausea, vomiting, vomiting blood, weight loss, and  weight gain.      Reports diverticulosis.     Denies anal fissure, black tarry stools, change in bowel habit, constipation, diarrhea, fecal incontinence, heme positive stool, hemorrhoids, irritable bowel syndrome, jaundice, light color stool, liver problems, rectal bleeding, and  rectal pain.    Current Medications (verified): 1)  Fexofenadine Hcl 180 Mg Tabs (Fexofenadine Hcl) .... Take One Tablet By Mouth Once Daily 2)  Aspirin 81 Mg Tabs (Aspirin) .... Take 1 Tab By Mouth Once Daily.Marland KitchenMarland Kitchen 3)  Plavix 75 Mg Tabs (Clopidogrel Bisulfate) .... Take 1 Tablet By Mouth Once A Day 4)  Nitroglycerin 0.4 Mg Subl (Nitroglycerin)  .... One Tablet Under Tongue Every 5 Minutes As Needed For Chest Pain---May Repeat Times Three 5)  Imdur 30 Mg Xr24h-Tab (Isosorbide Mononitrate) .... Take 1 Tablet By Mouth Once A Day 6)  Metoprolol Tartrate 50 Mg  Tabs (Metoprolol Tartrate) .... Take 2 Tabs in Am, and 1 Tab in Pm. 7)  Furosemide 40 Mg Tabs (Furosemide) .Marland Kitchen.. 1 Once Daily 8)  Lipitor 40 Mg Tabs (Atorvastatin Calcium) .... Take 1 Tab By Mouth Once Daily.Marland KitchenMarland Kitchen 9)  Protonix 40 Mg Tbec (Pantoprazole Sodium) .... Take 1 Tab By Mouth Two Times A Day 10)  Neurontin 300 Mg  Caps (Gabapentin) .... Take One Capsule By Mouth At Breakfast and Lunch- Take Two Capsules By Mouth At Bedtime 11)  Zolpidem Tartrate 10 Mg  Tabs (Zolpidem Tartrate) .... Take 1 Tab By Mouth At Bedtime As Needed 12)  Womens Multivitamin Plus  Tabs (Multiple Vitamins-Minerals) .... Take 1 Tab Daily... 13)  B Complex Vitamins  Caps (B Complex Vitamins) .... Take 1 Tab Daily... 14)  Lexapro 10 Mg Tabs (Escitalopram Oxalate) .... Take 1 Tab By Mouth Once Daily... 15)  Doxycycline Hyclate 100 Mg Tabs (Doxycycline Hyclate) .... Take 1 Tablet By Mouth Two Times A Day  Allergies (verified): 1)  ! Codeine  Past History:  Past Medical History:  1. Coronary artery disease status post prior drug-eluting stent to the     circumflex artery in 2006 and recent drug-eluting stent to left     anterior descending 2. Good left ventricular function. 3. Hypertension. 4. Hyperlipidemia. 5. Hiatal hernia and gastroesophageal reflux disease.  6. Dysphasia, possible esophageal stricture. 7. Lumbar disk disease.  8. 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 embolic ?  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 S/P hysterectomy S/P A/P repair and Raz urethral suspension 1993 by DrNeal S/P cholecystectomy S/P 3 diff lumbar surgeries  Family History: Mother heart disease deceased  Father heart disease deceased Family History of Breast Cancer: cousions No FH of Colon Cancer:  Social History: Reviewed history from 04/21/2009 and no changes required. Married, husb= Jailyn Leeson keeps mother at home w/ severe Alhzeimer's exsmoker, quit 2000 after 40 yrs of 1+ppd... Alcohol Use - no Daily Caffeine Use Illicit Drug Use - no  Review of Systems  The patient denies anorexia, fever, weight loss, weight gain, vision loss, decreased hearing, hoarseness, chest pain, syncope, dyspnea on exertion, peripheral edema, prolonged cough, headaches, hemoptysis, abdominal pain, melena, hematochezia, severe indigestion/heartburn, hematuria, incontinence, genital sores, muscle weakness, suspicious skin lesions, transient blindness, difficulty walking, depression, unusual weight change, abnormal bleeding, enlarged lymph nodes, angioedema, breast masses, and testicular masses.    Vital Signs:  Patient profile:   70 year old female Height:      69 inches Weight:      209 pounds BMI:     30.98 BSA:     2.11 Pulse rate:   64 / minute Pulse rhythm:   regular BP sitting:   128 / 86  (left arm) Cuff size:   large  Vitals Entered By: Ok Anis CMA (June 11, 2010 2:57 PM)  Physical Exam  General:  Pleasant white female sitting comfortably Head:   Normocephalic and atraumatic. Neck:  no obvious masses  Lungs:  Clear throughout to auscultation. Heart:  Regular rate and rhythm; no murmurs, rubs,  or bruits. Abdomen:  Abdomen soft,obese, nondistended. Moderate LLQ tenderness. No obvious masses or hepatomegaly.Normal bowel sounds.  Msk:  Symmetrical with no gross deformities. Normal posture. Neurologic:  Alert and  oriented x4;  grossly normal neurologically. Skin:  Intact without significant lesions or rashes. Psych:  Alert and cooperative. Normal mood and affect.   Impression & Recommendations:  Problem # 1:  ABDOMINAL PAIN-LLQ (ICD-789.04) Assessment Deteriorated Recurrent LLQ pain. Patient has a history of diverticulosis. She was treated empirically for diverticulitis a year ago. Responded nicely to antibiotics though CTscan was negative for diverticulitis. Describing same symptoms. She may have subclinical diverticulitis. Will retreat with course of Cipro and Flagyl. Patient will call us in a few days with condition update. She will call beforehand for fevers and / or  worsening pain.   Problem # 2:  PERSONAL HX COLONIC POLYPS (ICD-V12.72) Assessment: Comment Only  Surveillance colonoscopy due July 2012.  Patient Instructions: 1)  Copy sent to : Alroy Dust, MD 2)  We have sent two prescriptions to your pharmacy 3)  Please contact the offcice next week to let us know how your doing 4)  The medication list was reviewed and reconciled.  All changed / newly prescribed medications were explained.  A complete medication list was provided to the patient /  caregiver. Prescriptions: CIPRO 500 MG TABS (CIPROFLOXACIN HCL) One twice daily  #20 x 0   Entered and Authorized by:   Willette Cluster NP   Signed by:   Willette Cluster NP on 06/11/2010   Method used:   Electronically to        [facility] (retail)       [address]       [place], [place]  [address]       Ph: [phone]       Fax: [phone]    RxID:   [id number] FLAGYL 500 MG TABS (METRONIDAZOLE) One three times a day x 10 dasy  #30 x 0   Entered and Authorized by:   Willette Cluster NP   Signed by:   Willette Cluster NP on 06/11/2010   Method used:   Electronically to        [facility] (retail)       [address]       [place], [place]  [address]       Ph: [phone]       Fax: [phone]   RxID:   [id number]

## 2010-07-15 NOTE — Progress Notes (Signed)
Summary: Wants to be seen today PA ok  Phone Note Call from Patient Call back at cell [phone]   Call For: Dr Jarold Motto Reason for Call: Talk to Nurse Summary of Call: Wants to be seen today Diverticulitis flare up x2wks but its worse today. Pain in the groin area. Lives out of town and is in the area now. Initial call taken by: Leanor Kail [facility],  June 10, 2010 10:25 AM  Follow-up for Phone Call        Patient reports abdominal x 2 weeks in the left lower abdominal area. Patient stated she usually hurts in the groin area, but this is the same place she hurt last year when she had a diverticulitis flare. Patient saw Willette Cluster NP at that time and she gave her an antibiotic and another med. She had a refill on an antibiotic from a cold so she started it when the pain began. Patient doesn't know the name of the drug, but she stated it didn't help although she felt some better yesterday and today- but still hurting. Patient given an appointment with Willette Cluster NP tomorrow @ 3pm. Asked patient to bring the bottle of antibiotic she took. Follow-up by: Graciella Freer RN,  June 10, 2010 11:46 AM

## 2010-07-15 NOTE — Progress Notes (Signed)
Summary: refill  Phone Note Call from Patient Call back at Home Phone [phone]   Caller: Patient Call For: nadel Reason for Call: Refill Medication Summary of Call: Requests refill on fexofenadine hcl 180mg .//[facility] Initial call taken by: Darletta Moll,  June 03, 2010 11:07 AM  Follow-up for Phone Call        Spoke with pt and notified that rx was faxed to [facility]. Follow-up by: Vernie Murders,  June 03, 2010 2:14 PM    Prescriptions: FEXOFENADINE HCL 180 MG TABS (FEXOFENADINE HCL) take one tablet by mouth once daily  #90 x 3   Entered by:   Vernie Murders   Authorized by:   Michele Mcalpine MD   Signed by:   Vernie Murders on 06/03/2010   Method used:   Faxed to ...       [facility] (mail-order)             , [place]         Ph: [phone]       Fax: [phone]   RxID:   [id number]

## 2010-07-26 ENCOUNTER — Encounter: Payer: Self-pay | Admitting: Pulmonary Disease

## 2010-08-10 NOTE — Consult Note (Signed)
Summary: [facility]  [facility]   Imported By: Sherian Rein 08/03/2010 11:37:39  _____________________________________________________________________  External Attachment:    Type:   Image     Comment:   External Document

## 2010-08-13 ENCOUNTER — Telehealth: Payer: Self-pay | Admitting: Gastroenterology

## 2010-08-16 ENCOUNTER — Other Ambulatory Visit: Payer: Self-pay | Admitting: Gastroenterology

## 2010-08-16 ENCOUNTER — Other Ambulatory Visit: Payer: Self-pay

## 2010-08-16 ENCOUNTER — Telehealth: Payer: Self-pay | Admitting: Nurse Practitioner

## 2010-08-16 DIAGNOSIS — R109 Unspecified abdominal pain: Secondary | ICD-10-CM

## 2010-08-16 DIAGNOSIS — K5790 Diverticulosis of intestine, part unspecified, without perforation or abscess without bleeding: Secondary | ICD-10-CM

## 2010-08-23 ENCOUNTER — Encounter (INDEPENDENT_AMBULATORY_CARE_PROVIDER_SITE_OTHER): Payer: Self-pay | Admitting: *Deleted

## 2010-08-23 ENCOUNTER — Other Ambulatory Visit: Payer: Self-pay | Admitting: Gastroenterology

## 2010-08-23 ENCOUNTER — Other Ambulatory Visit: Payer: Medicare Other

## 2010-08-23 DIAGNOSIS — R1032 Left lower quadrant pain: Secondary | ICD-10-CM

## 2010-08-23 LAB — BASIC METABOLIC PANEL
BUN: 13 mg/dL (ref 6–23)
Chloride: 98 mEq/L (ref 96–112)
GFR: 60.81 mL/min (ref >60.00)
Potassium: 3.7 mEq/L (ref 3.5–5.1)
Sodium: 137 mEq/L (ref 135–145)

## 2010-08-24 NOTE — Progress Notes (Signed)
Summary: Resch'd CT scan  Phone Note Call from Patient Call back at Home Phone [phone]   Caller: Patient Call For: Wilmon Pali Reason for Call: Talk to Nurse Summary of Call: Wants to resch'd her CT scan from 08-18-10 until next week Initial call taken by: Karna Christmas,  August 16, 2010 11:45 AM  Follow-up for Phone Call        Patient's sister is not doing well, she wants to r/s the CT scan to next week. Gave her the phone number to [place] to r/s Follow-up by: Jesse Fall RN,  August 16, 2010 12:28 PM  Additional Follow-up for Phone Call Additional follow up Details #1::        ok Additional Follow-up by: [name] MD [facility],  August 16, 2010 1:46 PM

## 2010-08-24 NOTE — Progress Notes (Addendum)
Summary: triage  Phone Note Call from Patient Call back at Home Phone [phone]   Caller: Patient Call For: Dr Jarold Motto Reason for Call: Talk to Nurse Summary of Call: Patient has a diverticulities flare up needs flagyl and cipro called in to [facility] in [place]. (pts husband just passed on 02-Nov-2022. so she's hard to understand becuase she was crying) Initial call taken by: Tawni Levy,  August 13, 2010 10:46 AM  Follow-up for Phone Call        Patient had COLON on 12/16/2005 dx Diverticulosis from Descending to Sigmoid Colon. CT Scan 04/22/2009 reporting multiple Diverticula seen. Last episode of Diverticulitis 06/11/10 treated by Willette Cluster NP . Today,  patient reports lower L side abdominal pain that she has delayed calling us about d/t her husband's illness; husband passed away this Nov 02, 2022. The pain radiates to her back, but she denies diarrhea or bleeding. Patient states the pain is like she experiences with her Diverticulitis  and she is requesting antibiotics. Per Willette Cluster NP , order Flagyl and Cipro and pain med if necessary- Tramadol or Hydrocodone low dose. Patient reports she has used Mobic in the past with good results- she itches when she uses Narcotics.  Patient instructed to call us in about a week with an update on her pain. Patient stated understanding. Follow-up by: Graciella Freer RN,  August 13, 2010 12:01 PM  Additional Follow-up for Phone Call Additional follow up Details #1::        Agree. Rene Kocher, please get her ROV with Jarold Motto. I have seen patient the last 2 visits. Thanks Additional Follow-up by: Willette Cluster NP,  August 16, 2010 9:47 AM    Additional Follow-up for Phone Call Additional follow up Details #2::    needs CT scan asap before further Rx... Follow-up by: [name] MD Clementeen Graham,  August 16, 2010 9:52 AM  Additional Follow-up for Phone Call Additional follow up Details #3:: Details for Additional Follow-up Action Taken: Scheduled CT  scan at [facility] CT on 08/18/10 at 10:00 AM.  NPO after midnight. Drink contrast 1 bottle 2 hours prior and 1 bottle 1 hour prior. Patient to have labs drawn on 08/17/10 (stat BMET) and pick up her contrast. Scheduled patient to see Dr. Jarold Motto on 09/10/10 at 8:45 AM. Patient aware of above. Additional Follow-up by: Jesse Fall RN,  August 16, 2010 11:01 AM  New/Updated Medications: FLAGYL 500 MG TABS (METRONIDAZOLE) One three times a day x 10 days CIPRO 500 MG TABS (CIPROFLOXACIN HCL) One twice daily x 10 days ULTRAM 50 MG TABS (TRAMADOL HCL) 1 by mouth two times a day for pain Prescriptions: ULTRAM 50 MG TABS (TRAMADOL HCL) 1 by mouth two times a day for pain  #60 x 0   Entered by:   Graciella Freer RN   Authorized by:   Willette Cluster NP   Signed by:   Graciella Freer RN on 08/13/2010   Method used:   Electronically to        [facility] (retail)       [address]       [place], [place]  [address]       Ph: [phone]       Fax: [phone]   RxID:   [id number] CIPRO 500 MG TABS (CIPROFLOXACIN HCL) One twice daily x 10 days  #20 x 0   Entered by:   Graciella Freer RN  Authorized by:   Willette Cluster NP   Signed by:   Graciella Freer RN on 08/13/2010   Method used:   Electronically to        [facility] (retail)       [address]       [place], [place]  [address]       Ph: [phone]       Fax: [phone]   RxID:   [id number] FLAGYL 500 MG TABS (METRONIDAZOLE) One three times a day x 10 days  #30 x 0   Entered by:   Graciella Freer RN   Authorized by:   Willette Cluster NP   Signed by:   Graciella Freer RN on 08/13/2010   Method used:   Electronically to        [facility] (retail)       [address]       [place], [place]  [address]       Ph: [phone]       Fax: [phone]   RxID:   [id number]   Appended Document: triage    Clinical Lists  Changes  Orders: Added new Referral order of CT Abdomen/Pelvis with Contrast (CT Abd/Pelvis w/con) - Signed

## 2010-08-25 ENCOUNTER — Ambulatory Visit (INDEPENDENT_AMBULATORY_CARE_PROVIDER_SITE_OTHER)
Admission: RE | Admit: 2010-08-25 | Discharge: 2010-08-25 | Disposition: A | Discharge status: Home / Self Care | Payer: Medicare Other | Source: Ambulatory Visit | Attending: Gastroenterology | Admitting: Gastroenterology

## 2010-08-25 DIAGNOSIS — K5790 Diverticulosis of intestine, part unspecified, without perforation or abscess without bleeding: Secondary | ICD-10-CM

## 2010-08-25 DIAGNOSIS — R109 Unspecified abdominal pain: Secondary | ICD-10-CM

## 2010-08-25 DIAGNOSIS — K5730 Diverticulosis of large intestine without perforation or abscess without bleeding: Secondary | ICD-10-CM

## 2010-08-25 HISTORY — DX: Essential (primary) hypertension: I10

## 2010-08-25 MED ORDER — IOHEXOL 300 MG/ML  SOLN
99.0000 mL | Freq: Once | INTRAMUSCULAR | Status: AC | PRN
  Administered 2010-08-25: 99 mL via INTRAVENOUS

## 2010-09-06 ENCOUNTER — Other Ambulatory Visit: Payer: Self-pay | Admitting: Pulmonary Disease

## 2010-09-10 ENCOUNTER — Encounter: Payer: Self-pay | Admitting: Gastroenterology

## 2010-09-10 ENCOUNTER — Ambulatory Visit (INDEPENDENT_AMBULATORY_CARE_PROVIDER_SITE_OTHER): Payer: Medicare Other | Admitting: Gastroenterology

## 2010-09-10 ENCOUNTER — Other Ambulatory Visit (INDEPENDENT_AMBULATORY_CARE_PROVIDER_SITE_OTHER): Payer: Medicare Other

## 2010-09-10 DIAGNOSIS — R109 Unspecified abdominal pain: Secondary | ICD-10-CM

## 2010-09-10 DIAGNOSIS — D649 Anemia, unspecified: Secondary | ICD-10-CM

## 2010-09-10 DIAGNOSIS — K5900 Constipation, unspecified: Secondary | ICD-10-CM

## 2010-09-10 DIAGNOSIS — Z79899 Other long term (current) drug therapy: Secondary | ICD-10-CM

## 2010-09-10 DIAGNOSIS — K559 Vascular disorder of intestine, unspecified: Secondary | ICD-10-CM

## 2010-09-10 LAB — TSH: TSH: 1.7 u[IU]/mL (ref 0.35–5.50)

## 2010-09-10 LAB — BASIC METABOLIC PANEL
GFR: 89.01 mL/min (ref >60.00)
Potassium: 4.4 mEq/L (ref 3.5–5.1)
Sodium: 140 mEq/L (ref 135–145)

## 2010-09-10 LAB — CBC WITH DIFFERENTIAL/PLATELET
Basophils Absolute: 0 10*3/uL (ref 0.0–0.1)
HCT: 42.5 % (ref 36.0–46.0)
Hemoglobin: 14.4 g/dL (ref 12.0–15.0)
Lymphs Abs: 1.6 10*3/uL (ref 0.7–4.0)
MCHC: 34 g/dL (ref 30.0–36.0)
MCV: 92.9 fl (ref 78.0–100.0)
Monocytes Absolute: 0.8 10*3/uL (ref 0.1–1.0)
Monocytes Relative: 12.9 % — ABNORMAL HIGH (ref 3.0–12.0)
Neutro Abs: 3.4 10*3/uL (ref 1.4–7.7)
RDW: 14.6 % (ref 11.5–14.6)

## 2010-09-10 LAB — HEPATIC FUNCTION PANEL
Albumin: 3.8 g/dL (ref 3.5–5.2)
Alkaline Phosphatase: 65 U/L (ref 39–117)
Bilirubin, Direct: 0.2 mg/dL (ref 0.0–0.3)
Total Bilirubin: 0.9 mg/dL (ref 0.3–1.2)

## 2010-09-10 LAB — IBC PANEL
Iron: 132 ug/dL (ref 42–145)
Transferrin: 257.1 mg/dL (ref 212.0–360.0)

## 2010-09-10 LAB — SEDIMENTATION RATE: Sed Rate: 22 mm/hr (ref 0–22)

## 2010-09-10 MED ORDER — DICYCLOMINE HCL 20 MG PO TABS: 20.0000 mg | ORAL_TABLET | Freq: Four times a day (QID) | ORAL | Status: DC

## 2010-09-10 NOTE — Progress Notes (Signed)
History of Present Illness:  This is a 70 year old Caucasian female with coronary artery disease and previous stents ,hypertension, hyperlipidemia, and chronic lumbosacral spine disease. Since Christmas of this year she's had recurrent left lower quadrant pain worse with eating, and it is associated with mild constipation. Has a history of peripheral vascular disease with previous subclavian stenosis treated by Dr. Josephina Gip. She has vascular insufficiency of her lower extremities with persistent edema, and also a history of varicose veins. Other cardiac problems include mitral valve prolapse and a history of a CVA in 2010. Is on multiple medications including daily aspirin, Imdur, Toprol and Neurontin. Because of her left lower quadrant pain she was recently treated with antibiotics with good response. Other diagnoses include  obesity, GERD, DJD, anxiety and depression, and chronic anemia. CT scan of the abdomen on March 14 showed diverticulosis and extensive calcification of the abdominal aorta. There was no evidence of acute diverticulitis or other abnormalities. She did take metronidazole and doxycycline for 10 days for suspected diverticulitis. She currently denies significant left lower quadrant pain.   ROS: The remainder of the 10 point ROS is negative.... main complaint is peripheral edema and discomfort in her legs with walking. She gives no current symptoms of congestive heart failure but does have dyspnea with exertion and shortness shortness of breath with exertion. She has chronic low back pain, fibromyalgia, and chronic anxiety and depression and takes oxaprozin 10 mg a day. Meds above she is on Plavix and aspirin. The patient has smoked heavily for greater than 40 years but denies alcohol abuse. He denies current cough, sputum production, or hemoptysis. For sleep disorders she is on qhs Ambien.  Past Medical History  Diagnosis Date  . Hypertension   . CAD (coronary artery disease)   .  Hyperlipemia   . Hiatal hernia   . Esophageal reflux   . Lumbar disc disease   . Allergic rhinitis, cause unspecified   . Sleep related hypoventilation/hypoxemia in conditions classifiable elsewhere   . Shortness of breath   . Other diseases of lung, not elsewhere classified   . Chronic diastolic heart failure   . Peripheral vascular disease, unspecified   . Unspecified venous (peripheral) insufficiency   . Pure hypercholesterolemia   . Obesity, unspecified   . Cyst of thyroid   . Diverticulosis of colon (without mention of hemorrhage)   . Personal history of colonic polyps     ADENOMATOUS POLYP  . Osteoarthrosis, unspecified whether generalized or localized, unspecified site   . Lumbago   . Nonspecific abnormal finding in stool contents   . Unspecified cerebral artery occlusion with cerebral infarction   . Anxiety state, unspecified   . Anemia, unspecified   . Stroke 11/2008   Past Surgical History  Procedure Date  . Coronary angioplasty with stent placement 1990's  . Laparoscopic hysterectomy   . Urethral suspension 1993    Dr. Jennette Kettle  . Lumbar disc surgery     X 3  . Cholecystectomy     reports that she has quit smoking. Her smoking use included Cigarettes. She has a 40 pack-year smoking history. She does not have any smokeless tobacco history on file. She reports that she does not drink alcohol or use illicit drugs. family history includes Breast cancer in an unspecified family member and Heart disease in her father and mother. Allergies  Allergen Reactions  . Codeine     REACTION: itch       Physical Exam: General well developed well nourished  patient in no acute distress, appearing their stated age Eyes PERRLA, no icterus, fundoscopic exam per opthamologist Skin no lesions noted Neck supple, no adenopathy, no thyroid enlargement, no tenderness. Decreased breath sounds bilaterally. Chest clear to percussion and auscultation Heart no significant murmurs, gallops or  rubs noted Abdomen no hepatosplenomegaly masses or tenderness, BS normal.  Rectal  Refused the patient. Extremities no acute joint lesions, edema, phlebitis or evidence of cellulitis. There marked varicosities or lower terminate with +1 edema but no evidence of phlebitis or acute joint lesions. Neurologic patient oriented x 3, cranial nerves intact, no focal neurologic deficits noted. Psychological mental status normal and normal affect.  Assessment and plan: I am concerned this patient has ischemic bowel problems associated with her severe atherogenesis and atherosclerosis and peripheral vascular disease with previous interventional surgery by Dr. Hart Rochester. Recent CT scan did not confirm evidence of diverticulitis and she is up-to-date on her colonoscopy exams. Schedule her for MRI mesenteric angiography with when necessary sublingual Levsin use in the interim.  Please copy her primary care physician, referring physician, and pertinent subspecialists.

## 2010-09-10 NOTE — Progress Notes (Deleted)
History of Present Illness:  This is a 70 year old Caucasian female referred from primary care for evaluation of her years of abdominal cramping, gas and bloating and mild constipation. Over the last 4-6 weeks she's had more severe lower bowel no cramping, distention, pain, flushing, nausea and fatigue. She has had 2 episodes of hematochezia denies major rectal bleeding or melena. She attributes a lot of her fatigue to change her birth control pills, and she is followed by Dr. Adalberto Ill in OB/GYN. The patient denies any specific food intolerances, and does not use sorbitol or fructosamine her diet. Her appetite is good and her weight is stable. She denies systemic complaints but does have some arthralgias in her hands. He has not had previous GI evaluation, barium studies or colonoscopies. There is no history of upper gastrointestinal or hepatobiliary complaints. Review of her lab shows a depressed serum albumin of 3.2 g percent, sedimentation rate 33, hematocrit 35. Liver function tests otherwise were normal. The patient denies NSAID use but does occasionally take Excedrin for headaches and the history is entirely noncontributory. The patient has not had previous abdominal surgeries, but has had 2 hysterectomies.     ROS: The remainder of the 10 point ROS is negative.. she's denies cardiovascular, pulmonary, genitourinary or neurological problems except for headaches. Does not smoke or abuse ethanol.     Physical Exam: General well developed well nourished patient in no acute distress, appearing their stated age Eyes PERRLA, no icterus, fundoscopic exam per opthamologist Skin no lesions noted Neck supple, no adenopathy, no thyroid enlargement, no tenderness Chest clear to percussion and auscultation Heart no significant murmurs, gallops or rubs noted Abdomen no hepatosplenomegaly masses or tenderness, BS normal.  Rectal inspection normal no fissures, or fistulae noted.  No masses or tenderness on  digital exam. Stool guaiac negative. Extremities no acute joint lesions, edema, phlebitis or evidence of cellulitis. Neurologic patient oriented x 3, cranial nerves intact, no focal neurologic deficits noted. Psychological mental status normal and normal affect.  Assessment and plan: Rule out celiac disease versus other causes of malabsorption per her depresseed serum albumin level. Her hematochezia is of concern, I have scheduled diagnostic colonoscopy. Labs have been ordered for celiac disease,and anemia profile and celiac serologies. I placed her on sublingual when necessary Levsin pending further workup. She may need endoscopy and small bowel biopsy. It is certainly possible that most of her symptoms are related to constipation predominant herbal bowel syndrome. The patient does have a history of chronic depression and is on Zoloft 100 mg a day.

## 2010-09-10 NOTE — Patient Instructions (Addendum)
Please go to the basement today for your labs.  Your prescription(s) have been sent to you pharmacy.  Your MRI is scheduled for 09/16/2010 10:00am at [facility], please arrive 15 min early and nothing to eat or drink after midnight.

## 2010-09-16 ENCOUNTER — Other Ambulatory Visit: Payer: Self-pay | Admitting: Gastroenterology

## 2010-09-16 ENCOUNTER — Ambulatory Visit (HOSPITAL_COMMUNITY)
Admission: RE | Admit: 2010-09-16 | Discharge: 2010-09-16 | Disposition: A | Discharge status: Home / Self Care | Payer: Medicare Other | Source: Ambulatory Visit | Attending: Gastroenterology | Admitting: Gastroenterology

## 2010-09-16 ENCOUNTER — Telehealth: Payer: Self-pay | Admitting: *Deleted

## 2010-09-16 DIAGNOSIS — R109 Unspecified abdominal pain: Secondary | ICD-10-CM | POA: Insufficient documentation

## 2010-09-16 DIAGNOSIS — I774 Celiac artery compression syndrome: Secondary | ICD-10-CM | POA: Insufficient documentation

## 2010-09-16 DIAGNOSIS — K5900 Constipation, unspecified: Secondary | ICD-10-CM | POA: Insufficient documentation

## 2010-09-16 DIAGNOSIS — I700 Atherosclerosis of aorta: Secondary | ICD-10-CM | POA: Insufficient documentation

## 2010-09-16 MED ORDER — GADOBENATE DIMEGLUMINE 529 MG/ML IV SOLN
20.0000 mL | Freq: Once | INTRAVENOUS | Status: AC | PRN
  Administered 2010-09-16: 20 mL via INTRAVENOUS

## 2010-09-16 NOTE — Telephone Encounter (Signed)
Message copied by Harlow Mares on Thu Sep 16, 2010  8:51 AM ------      Message from: Jarold Motto, DAVID      Created: Fri Sep 10, 2010  1:46 PM       Needs B12 shots.Marland KitchenMarland Kitchen

## 2010-09-20 LAB — COMPREHENSIVE METABOLIC PANEL
ALT: 14 U/L (ref 0–35)
AST: 19 U/L (ref 0–37)
CO2: 27 mEq/L (ref 19–32)
Chloride: 111 mEq/L (ref 96–112)
GFR calc Af Amer: >60 mL/min (ref >60)
GFR calc non Af Amer: >60 mL/min (ref >60)
Sodium: 144 mEq/L (ref 135–145)
Total Bilirubin: 0.5 mg/dL (ref 0.3–1.2)

## 2010-09-20 LAB — CARDIAC PANEL(CRET KIN+CKTOT+MB+TROPI)
Relative Index: INVALID (ref 0.0–2.5)
Total CK: 53 U/L (ref 7–177)

## 2010-09-20 LAB — GLUCOSE, CAPILLARY
Glucose-Capillary: 100 mg/dL — ABNORMAL HIGH (ref 70–99)
Glucose-Capillary: 108 mg/dL — ABNORMAL HIGH (ref 70–99)
Glucose-Capillary: 119 mg/dL — ABNORMAL HIGH (ref 70–99)
Glucose-Capillary: 123 mg/dL — ABNORMAL HIGH (ref 70–99)

## 2010-09-20 LAB — LIPID PANEL
Cholesterol: 165 mg/dL (ref 0–200)
HDL: 46 mg/dL (ref >39)

## 2010-09-20 LAB — CBC
RBC: 4.1 MIL/uL (ref 3.87–5.11)
WBC: 6.9 10*3/uL (ref 4.0–10.5)

## 2010-09-20 LAB — PROTIME-INR
INR: 2.1 — ABNORMAL HIGH (ref 0.00–1.49)
INR: 2.4 — ABNORMAL HIGH (ref 0.00–1.49)
Prothrombin Time: 25.2 seconds — ABNORMAL HIGH (ref 11.6–15.2)

## 2010-09-20 LAB — URINALYSIS, ROUTINE W REFLEX MICROSCOPIC
Leukocytes, UA: NEGATIVE
Protein, ur: NEGATIVE mg/dL
Urobilinogen, UA: 0.2 mg/dL (ref 0.0–1.0)

## 2010-09-20 LAB — URINE MICROSCOPIC-ADD ON

## 2010-09-20 NOTE — Telephone Encounter (Signed)
ANGIO NORMAL.Marland KitchenMarland KitchenMarland Kitchen

## 2010-09-20 NOTE — Telephone Encounter (Signed)
Notified pt Dr Jarold Motto stated her ANGIO was normal. Pt will call back when the schedule is out to schedule her COLON due in July.

## 2010-09-20 NOTE — Telephone Encounter (Signed)
Notified pt that Dr Jarold Motto recommends she have Vit B12 injections: 1 IM weekly x 3 weeks and 1 IM monthly x 1 year. Pt stated her husband just died and there are 2 bottles on B12 injections left. She will have the home health nurse give them to her. I explained the dosing. To her.  Pt had questions about her MR angiogram and wanted the results. Dr Jarold Motto, please advise.

## 2010-10-26 NOTE — Discharge Summary (Signed)
NAME:  Stephanie Alvarado, Stephanie Alvarado                 ACCOUNT NO.:  [id number]   MEDICAL RECORD NO.:  [id number]           PATIENT TYPE:   LOCATION:                                 FACILITY:   PHYSICIAN:  Learta Codding, MD,FACC DATE OF BIRTH:  1941-01-28   DATE OF ADMISSION:  DATE OF DISCHARGE:                               DISCHARGE SUMMARY   ADDENDUM   After going over the patient's discharge instructions, she noted that  she has significant side effects to the Imdur.  She has not taken it  since she was given a prescription a couple of weeks ago.  Therefore,  she has been advised to refrain from taking Imdur until she is seen back  in followup; otherwise, the rest for her medications are as listed.  She  has been provided with a prescription for her Plavix so that this can be  continued.  Please add this to original discharge summary and make sure  a copy goes to the aforementioned physician.      Tereso Newcomer, PA-C      Learta Codding, MD,FACC  Electronically Signed    SW/MEDQ  D:  04/26/2008  T:  04/26/2008  Job:  [id number]

## 2010-10-26 NOTE — Discharge Summary (Signed)
NAMENAYANA, LENIG                 ACCOUNT NO.:  [id number]   MEDICAL RECORD NO.:  [id number]          PATIENT TYPE:  INP   LOCATION:  6529                         FACILITY:  [facility]   PHYSICIAN:  Learta Codding, MD,FACC DATE OF BIRTH:  June 21, 1940   DATE OF ADMISSION:  04/25/2008  DATE OF DISCHARGE:  04/26/2008                               DISCHARGE SUMMARY   CARDIOLOGIST:  Everardo Beals. Juanda Chance, MD, [facility], [place]   PRIMARY CARE PHYSICIAN:  Lonzo Cloud. Kriste Basque, MD   REASON FOR ADMISSION:  Exertional angina.   DISCHARGE DIAGNOSES:  1. Coronary artery disease, status post Promus drug-eluting stent to      the proximal left anterior descending on April 25, 2008.      a.     Residual coronary artery disease treated medically:  70%       ostial large septal perforator, 40% proximal, 30% distal right       coronary artery stenosis.      b.     History of ZoMaxx drug-eluting stent placement to the right       coronary artery in 2006.  2. Preserved left ventricular function, ejection fraction of 60% by      cardiac catheterization.  3. Hypertension.  4. Hyperlipidemia.  5. Degenerative disk disease followed by Dr. Ethelene Hal.  6. History of peripheral arterial disease, status post left subclavian      stenosis surgery in 1998 by Dr. Hart Rochester (carotid to subclavian      bypass).  7. Dyslipidemia.  8. Gastroesophageal reflux disease.   PROCEDURES PERFORMED THIS ADMISSION:  Percutaneous coronary intervention  by Dr. Charlies Constable on April 25, 2008, with Promus drug-eluting stent  to the proximal left anterior descending.   ADMISSION HISTORY:  Ms. Holsonback is a 70 year old female patient followed  by Dr. Juanda Chance with a history of coronary disease, status post prior  stenting to the RCA in 2006.  She recently presented to the office with  complaints of exertional chest tightness and shortness of breath.  Her  symptoms were concerning for progressive coronary artery disease and she  was brought in for outpatient  cardiac catheterization.  This was done on  April 21, 2008.  Her cardiac catheterization done by Dr. Juanda Chance  demonstrated an 80% proximal LAD stenosis, 70% ostial stenosis in the  large septal perforator, no significant obstruction in the circumflex,  less than 10% narrowing in the stent RCA, and 40% proximal and 30%  distal stenosis in the RCA.  Her EF was preserved at 60%.  Dr. Juanda Chance  held discussions with Dr. Ethelene Hal who is treating her degenerative disk  disease.  There were no plans for surgery at this time; therefore, Dr.  Juanda Chance decided to proceed with drug-eluting stent placement to the LAD.  She was brought into [facility] on April 25, 2008, for said  procedure.   HOSPITAL COURSE:  As noted above, the patient came into the [facility] on April 25, 2008, for PCI.  She underwent IVUS of the LAD  and successful drug-eluting stent placement to the proximal LAD  with a  Promus stent by Dr. Juanda Chance.  She tolerated the procedure well and had no  complications.  The patient will need to remain on aspirin indefinitely  and Plavix for a minimum of 1 year.  On the morning of discharge, she  was in stable condition without chest pain.  Her groin was stable  without hematoma or bruit.  She is now being discharged to home in  stable condition.  The patient had been placed on Imdur prior to her  admission.  This will be continued.  She does have residual stenosis in  the large septal perforator as outlined above.  All the medications will  be continued the same at this point.   LABORATORY AND ANCILLARY DATA:  Hemoglobin 13.1, platelet count 215,000.  Sodium 141, potassium 3.9, BUN 6, creatinine 0.71, glucose 99.   DISCHARGE MEDICATIONS:  1. Coated aspirin 325 mg daily.  2. Plavix 75 mg daily.  3. Metoprolol 50 mg b.i.d.  4. Norvasc 5 mg daily.  5. Neurontin 100 mg b.i.d.  6. Imdur 30 mg daily.  7. Simvastatin 80 mg at bedtime.  8. Nexium 40 mg daily.  9.  Nitroglycerin p.r.n. chest pain.  10.Ambien 10 mg at bedtime.   DIET:  Low-sodium diet.   WOUND CARE:  The patient should call our office in [place] if any  groin swelling, bleeding, or bruising or fever.   ACTIVITY:  She is to increase her activity slowly.  She may shower.  No  lifting or sexual activity for 2 weeks.  No driving for 1 week.   FOLLOWUP:  1. Followup will be with Dr. Juanda Chance in 2 weeks.  The office will      contact her with an appointment.  2. She will follow up with Dr. Kriste Basque as directed.  3. She will follow up with Dr. Ethelene Hal as directed.   Total physician-PA time greater than 30 minutes on this discharge.      Tereso Newcomer, PA-C      Learta Codding, MD,FACC     SW/MEDQ  D:  04/26/2008  T:  04/26/2008  Job:  [id number]   cc:   Lonzo Cloud. Kriste Basque, MD

## 2010-10-26 NOTE — Assessment & Plan Note (Signed)
[facility]                            CARDIOLOGY OFFICE NOTE   ALARIA, OCONNOR                        MRN:          [id number]  DATE:04/09/2008                            DOB:          [birth year]    PRIMARY CARE PHYSICIAN:  Lonzo Cloud. Kriste Basque, MD   CLINICAL HISTORY:  Ms. Aikens is 70 years old and returned for  management of her coronary heart disease.  She says over the last  several weeks, she has had increasing shortness of breath and tightness  with exertion.  Her symptoms have become progressive and they have  become quite frightening.  She says she has a great deal of difficulties  getting up her stairs at home.  She had no rest symptoms.   We had evaluated her in January with shortness of breath and did a  Myoview scan, which showed no evidence of ischemia.  She had a previous  Zomax stent placed in the right coronary in 2006 and she had residual  70% narrowing in the LAD.  Her last cath was in 2006.   PAST MEDICAL HISTORY:  Significant for surgery for subclavian stenosis.  She also has hypertension and hyperlipidemia.   She is a previous smoker, but has had not smoked in 10 years.  She says  Dr. Kriste Basque says that she does not have any significant lung problem.   CURRENT MEDICATIONS:  1. Nexium.  2. Metoprolol 50 mg b.i.d.  3. Norvasc 5 mg daily.  4. Plavix.  5. Fish oil.  6. Aspirin.  7. Neurontin.  8. Lipitor 20 mg daily, which she is taking temporarily.  9. Simvastatin 80 daily.   PHYSICAL EXAMINATION:  VITAL SIGNS:  The blood pressure was 116/71 and  the pulse 70 regular.  NECK:  There was no venous tension.  The carotid pulses were full  without bruits.  CHEST:  Clear.  CARDIAC:  Rhythm was regular.  I could hear no murmurs or gallops.  ABDOMEN:  Soft with normal bowel sounds.  There is no  hepatosplenomegaly.  EXTREMITIES:  Peripherals were full with no peripheral edema.   An ECG showed an old inferior infarct.   IMPRESSION:  1. Coronary artery disease status post prior stenting of the right      artery coronary in 2006 with Zomax drug-eluting stent with residual      70% disease in left anterior descending (coronary artery).  2. Recent increased exertional dyspnea suggestive of anginal      equivalent.  3. Status post surgery for subclavian stenosis.  4. Hyperlipidemia.  5. Hypertension.   RECOMMENDATIONS:  Ms. Polasek symptoms are quite concerning for  ischemia.  I think at this point we should evaluate her with  catheterization.  We will pan left in coronaries.  If we do not find any  answers, then we will probably do a right heart cath as well.  If we do  not find any answers from this, we may consider spiral CT.  She is  involved in taking care of her mother who has Alzheimer's, though she  will  see if she can get coverage so we can do this next week, if not we  will do it the following week.     Bruce Elvera Lennox Juanda Chance, MD, [facility]  Electronically Signed    BRB/MedQ  DD: 04/09/2008  DT: 04/10/2008  Job #: [id number]

## 2010-10-26 NOTE — H&P (Signed)
NAME:  Stephanie Alvarado, Stephanie Alvarado                 ACCOUNT NO.:  [id number]   MEDICAL RECORD NO.:  [id number]          PATIENT TYPE:  OUT   LOCATION:  MRI                          FACILITY:  [facility]   PHYSICIAN:  Deanna Artis. Hickling, M.D.DATE OF BIRTH:  14-Feb-1941   DATE OF ADMISSION:  11/11/2008  DATE OF DISCHARGE:                              HISTORY & PHYSICAL   CHIEF COMPLAINT:  Left-sided numbness and weakness, onset approximately  6 p.m.  The patient told the examiners that she was last seen normal  around 7:30 p.m.  There was truly mix up in timing.  She arrived at  around 8:14 and was seen around 8:30 and said that she had not had her  symptoms for about an hour.   She was assessed by the emergency department physician.  Time was  unknown.  He contacted me at 2101 hours.  We decided to place the  patient on t-PA after he assured me that the CT scan did not show any  acute lesions.  I have since reviewed the scan and it shows chronic  white matter changes, calcification of the carotids and basilar artery,  but no evidence of acute stroke.   The patient had complaints of left arm and leg weakness, left facial  numbness that went away, and predominantly left arm numbness.  At the  time that she arrived at [facility], she had evidence of a  wrist drop and did not have any significant sensory abnormality.   The patient was given 55 mg of t-PA, 5.5 by bolus and the rest en route.  She had a catheter angiogram, which showed mild atherosclerotic changes  on the right side, but no hemodynamically significant lesions in the  proximal vessels.  The patient had a carotid subclavian anastomosis on  the left side in 1998.  It remains quite patent.  There is an enormous  left vertebral artery as a result of this.   Other past medical history, the patient has had drug-eluting stents in  April 25, 2008 and also in 2006.  She has osteoarthritis.  She has  had lumbar fusion carried out in 2005  and has had several lumbar  laminectomies, L4-L5, L5-S1 prior to that time.  She has hiatal hernia  with gastroesophageal reflux.  Risk factors for stroke include  hypertension, dyslipidemia, and now diabetes.   CURRENT MEDICATIONS:  1. Plavix 75 mg daily.  2. Imdur 30 mg daily.  3. Zocor 80 mg daily.  4. Norvasc 5 mg daily.  5. Protonix 40 mg daily.  6. Metoprolol 100 mg in the morning, 50 mg at nighttime.  7. Zolpidem 10 mg at bedtime.   DRUG ALLERGIES:  CODEINE (itching).   FAMILY HISTORY:  Mother is alive and well with Alzheimer.  Father died  in 22s of heart disease.   SOCIAL HISTORY:  The patient has been married for 54 years.  She has 4  children, 4 grandchildren, 4 great grandchildren.  She quit using  tobacco 10 years ago.  She does not drink alcohol or take drugs.   Her 12-system  review is negative except as noted above.   PHYSICAL EXAMINATION:  VITAL SIGNS:  Today, blood pressure varied from  145-160 systolic, 75-88 diastolic; heart rate 99; respirations 20;  oxygen saturation 99% on 2 L; height 68 inches; weight 200 pounds.  HEAD, EYES, EARS, NOSE AND THROAT:  No signs of infection.  LUNGS:  Clear.  Bowel sounds normal.  Protuberant abdomen.  SKIN:  Negative.  There is no murmurs.  Pulses are normal.  No bruits.  NEUROLOGIC:  Awake, alert.  Cranial nerves, round and reactive pupils.  Visual fields full to double simultaneous stimuli.  Symmetric facial  strength.  Midline tongue.  Air conduction greater than bone conduction.  The patient had normal strength except for she has a left wrist drop and  weak grip.  She can flex or extend her right hand well.  She has clumsy  fine motor movements.  This is not a radial nerve palsy.  The patient  had intact sensation, good stereognosis.  I did not tested the left  hand.  Deep tendon reflexes were diminished to absent.  The patient had  bilateral flexor plantar responses.   Sodium 140, potassium 3.4, chloride 104, CO2 of  26, BUN 9, creatinine  0.77, glucose 192.  White count 7700, hemoglobin 14.2, hematocrit 42.3,  platelet count 358,000.  Prothrombin time 13.2, INR 1.0, PTT 27.5.  EKG  shows a sinus rhythm with left ventricular hypertrophy.  CT scan shows  calcified left middle cerebral artery and basilar artery and small  vessel white matter disease in the frontal regions bilaterally, left  greater than the right.   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  We need to make certain the patient does not have a proximal  vascular occlusion.  The angiogram ruled that out.  The patient will  have an MRI scan of the brain, labs to check for hemoglobin A1c, and  fasting lipid.  She will continue on Plavix 75 mg. (434.01)      Deanna Artis. Sharene Skeans, M.D.  Electronically Signed     [facility]/MEDQ  D:  11/11/2008  T:  11/12/2008  Job:  [id number]   cc:   Everardo Beals. Juanda Chance, MD, [facility]

## 2010-10-26 NOTE — Assessment & Plan Note (Signed)
[facility]                            CARDIOLOGY OFFICE NOTE   DEANGELA, RANDLEMAN                        MRN:          [id number]  DATE:05/15/2008                            DOB:          [birth year]    PRIMARY CARE PHYSICIAN:  Lonzo Cloud. Kriste Basque, MD   CLINICAL HISTORY:  Ms. Wearing is a 70 year old and returned for a  followup visit after a recent percutaneous coronary intervention.  She  has previous coronary artery disease and had a ZoMaxx stent placed in  the right coronary in 2006 and had residual 70% narrowing in the LAD.  She recently developed increased symptoms of chest pain and shortness of  breath, and we brought her in for catheterization in the J[facility] lab.  We  felt that the lesion was tighter and we brought her upstairs and did an  IVUS and felt that the lesion was probably flow-limiting and treated  lesion with a drug-eluting stent.  We crossed a very large septal  perforator and the stent pinched the septal perforator about 80%, but we  elected not to re-dilate that.   She has done well since that time.  She has had 1 episode where she felt  her heart beating fast for a couple of hours.  She has also had some  difficulty swallowing with the food sticking in her lower esophagus.   Her past medical history is significant for severe lumbar spine disease.  She has had 3 surgeries for this.  She has been seeing Dr. Ethelene Hal  recently for this, and we discussed the feasibility of using a drug-  eluting stent in her and decided that we could do that.  Her past  history is also significant for hiatal hernia and GERD and hypertension  and hyperlipidemia.   Her current medications include:  1. Nexium.  2. Metoprolol 50 mg b.i.d.  3. Norvasc 5 mg daily.  4. Plavix 75 mg daily.  5. Multivitamins.  6. Fish oil.  7. Aspirin 81 mg daily.  8. Neurontin.  9. Imdur 30 mg daily.  10.Simvastatin 80 mg daily.   PHYSICAL EXAMINATION:  VITAL SIGNS:  Today,  the blood pressure was  125/75 and pulse 75 and regular.  NECK:  There was no venous tension.  The carotid pulses were full  without bruits.  CHEST:  Was clear.  CARDIAC:  Rhythm was regular.  I could hear no murmurs or gallops.  ABDOMEN:  Soft, normal bowel sounds.  The right femoral artery site was  well healed.  EXTREMITIES:  Pedal pulses were equal, and there is no peripheral edema.   Electrocardiogram was normal.   IMPRESSION:  1. Coronary artery disease status post prior drug-eluting stent to the      circumflex artery in 2006 and recent drug-eluting stent to left      anterior descending, now stable.  2. Good left ventricular function.  3. Hypertension.  4. Hyperlipidemia.  5. Hiatal hernia and gastroesophageal reflux disease.  6. Dysphasia, possible esophageal stricture.  7. Lumbar disk disease.   RECOMMENDATIONS:  I think Ms. Beverely Pace  is doing fairly well.  She did have  an episode of palpitations and her pulse rates have been relatively  fast.  We will increase her metoprolol from 50 b.i.d. to 100 in the  morning and 50 in the evening.  I think we will wait on any further  evaluation of her dysphagia.  Since she has a new drug-eluting stent, we  would not want to take her off Plavix, so dilatation may be somewhat  problematic.  I will plan to see her back in followup in 8 weeks.     Bruce Elvera Lennox Juanda Chance, MD, [facility]  Electronically Signed    BRB/MedQ  DD: 05/15/2008  DT: 05/16/2008  Job #: [id number]

## 2010-10-26 NOTE — Consult Note (Signed)
NAME:  Stephanie Alvarado, Stephanie Alvarado                 ACCOUNT NO.:  [id number]   MEDICAL RECORD NO.:  [id number]          PATIENT TYPE:  INP   LOCATION:  3023                         FACILITY:  [facility]   PHYSICIAN:  Jesse Sans. Wall, MD, FACCDATE OF BIRTH:  1940-11-23   DATE OF CONSULTATION:  11/14/2008  DATE OF DISCHARGE:                                 CONSULTATION   PRIMARY CARDIOLOGIST:  Everardo Beals. Juanda Chance, MD, [facility]   PRIMARY CARE Merinda Victorino:  Dr. Lennon Alstrom.   PATIENT PROFILE:  A 70 year old Caucasian female who presented with  right brain stroke on November 12, 2008 and we were asked to evaluate for  question of AFib.   PROBLEMS:  1. 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  2. Peripheral arterial disease.      a.     History of subclavian stenosis, status post left subclavian       to left carotid artery bypass in 1998.  3. Coronary artery disease.      a.     On March 17, 2005, PCI and stenting of the RCA with       placement of 3.0 x 23 mm Zomax study stent.      b.     On April 25, 2008, PCI and stenting of the LAD with       placement of 2.75 x 80 mm Promus drug-eluting stent.  The patient       had residual 80% stenosis in the septal perforator.  4. Chronic dyspnea on exertion.  5. Normal LV function, EF 65% by echo November 12, 2008.  6. Hypertension.  7. Hyperlipidemia.  8. Degenerative joint disease, status post lumbar surgery on December      2004.  9. GERD.  10.History of dysphagia with questionable esophageal stricture.  11.Remote tobacco abuse, quit in 10 years ago.   HISTORY OF PRESENT ILLNESS:  A 70 year old Caucasian female with the  above problem list.  She has a history of  palpitations dating back  several years ago, described as fluttering in her chest and making the  whole bed shake lasting short time and resolving spontaneously.  She  has not had anything like that in over a year.  On June 2, she developed  left-sided weakness specifically of the arm and the leg and presented  to  [facility] ED.  There she was diagnosed with a right brain stroke and  was treated with TPA and subsequently admitted to 3100 where she has  been recovering well.  Initially, she was planned for TEE to rule out  embolus today, although sometime yesterday afternoon.  She has  bradycardia with question of atrial fibrillation.  As a result, we have  been asked to evaluate.  She has had no chest pain.  She denies  palpitations or dyspnea at rest.  She does have chronic dyspnea on  exertion over the past several years that has not really been impacted  by stenting in the 2006 or 2009.   ALLERGIES:  CODEINE causes itching.   CURRENT MEDICATIONS:  1. Norvasc 5 mg  daily.  2. Enoxaparin 40 mg daily.  3. Neurontin 100 mg t.i.d.  4. Imdur 30 mg daily.  5. Methylprednisolone 4 mg IV daily.  6. Lopressor 100 mg in a.m., 50 mg in the p.m.  7. Miconazole nightly.  8. Protonix 40 mg daily.  9. Zocor 80 mg nightly.  10.Coumadin per pharmacy.   FAMILY HISTORY:  Mother is alive at age 75 with Alzheimer's.  Father  died in his 109s of coronary artery disease.  She has no siblings.   SOCIAL HISTORY:  She lives in [place] with her husband at 54 years.  She is retired.  She has 4 children, 4 grandchildren, 4 great  grandchildren.  She previously smoked heavily, but quit 10 years ago.  She denies alcohol or drugs.  She is not routinely exercising.   REVIEW OF SYSTEMS:  Positive for chronic dyspnea on exertion that has  been no better or worse since her stent in November 2009.  She continues  to complain of left arm, hand, and leg weakness since June 2.  She feels  her arm and leg have improved, though her hand/wrist has not improved  much.  Otherwise, all systems reviewed and negative.  She is a full  code.   PHYSICAL EXAMINATION:  VITAL SIGNS:  Temperature 97.4, heart rate 56,  respirations 18, blood pressure 127/53, and pulse ox 94% on room air.  GENERAL:  Pleasant white female in no acute  distress.  Awake, alert, and  oriented x3.  HEENT:  Normal.  NEUROLOGIC:  Notable for 2/5 strength in the left upper extremity, 3/5  strength in the left lower extremity, and 4/5 strength on the right.  Cranial nerves appeared to be grossly intact.  MUSCULOSKELETAL:  Appears normal without deformity or effusion.  SKIN:  Warm and dry without lesions or masses.  NECK:  Supple.  No bruits or JVD.  LUNGS:  Respirations are regular and unlabored.  Clear to auscultation.  CARDIAC:  Regular S1 and S2 with 2/6 systolic murmur at the left upper  sternal border.  ABDOMEN:  Round, soft, nontender, and nondistended.  Bowel sounds  present x4.  EXTREMITIES:  Warm, dry, and pink.  No clubbing, cyanosis, or edema.  Dorsalis pedis and posterior tibial pulse 2+ and equal bilaterally.   ACCESSORY CLINICAL FINDINGS:  MRI of the brain on June 2, showed right  frontal parietal infarct measuring 1 x 2 cm.  Carotid and cerebral  angiography showed scattered arterial sclerotic changes especially  involving the right middle and right anterior cerebral artery  distribution.  She had previous left subclavian and left carotid bypass.  EKG shows sinus rhythm with PAC, normal axis rate of 83.  No acute ST-T  changes.  Review of telemetry shows sinus bradycardia with PACs and what  appears to be intermittent junctional rhythm with sinus beats  interspersed.  Hemoglobin 12.9, hematocrit  37.8, WBC 6.9, and platelets  227.  Sodium 144, potassium 3.3, chloride 111, CO2 of 27, BUN 9,  creatinine 0.58, glucose 104.  Total bilirubin 0.5, alkaline phosphatase  82, AST 19, ALT 14, total protein 6.4, albumin 3.4,  homocysteine 7.8,  hemoglobin A1c 5.9, CK 53, MB 1.6, troponin I 0.01.  Calcium 9.5, total  cholesterol 165, triglycerides 86, HDL 46, and LDL 102.   ASSESSMENT AND PLAN:  1. Bradycardia.  She is asymptomatic.  Telemetry has been reviewed,      and there is no obvious atrial fibrillation.  It appears that when  her heart rate drops below 50, she has sinus bradycardia with      premature atrial contraction and what appears to be junctional      sinus beats and premature atrial contractions interspersed.  It      seemed unlikely that this atrial fibrillation, although could not      completely rule out as it is literally on a beat-to-beat basis.  I      have discussed this with a Neurology team and will plan for TEE on      Monday.  The patient is currently being anticoagulated with      Coumadin.  2. Coronary artery disease.  The patient has chronic dyspnea on      exertion, which has not really changed since her last stent      procedure in 2009 and by her story, has not changed since 2006.      She otherwise, stable without chest pain.  She is currently being      treated with beta-blocker, nitrate, and statin therapy.  She was      previously on Plavix as she was bruising on aspirin.  3. Hypertension, currently stable.  4. Hyperlipidemia.  Continue statin therapy.  5. Cerebrovascular accident.  Management per Neurology.      Nicolasa Ducking, ANP      Jesse Sans. Daleen Squibb, MD, [facility]  Electronically Signed    CB/MEDQ  D:  11/14/2008  T:  11/15/2008  Job:  [id number]

## 2010-10-26 NOTE — Assessment & Plan Note (Signed)
[facility]                             PULMONARY OFFICE NOTE   NAME:Stephanie Alvarado, MENUCHA DICESARE                        MRN:          [id number]  DATE:05/01/2007                            DOB:          [birth year]    HISTORY OF PRESENT ILLNESS:  The patient is a 70 year old white female  patient of Dr. Jodelle Green who has a known history of hypertension,  atherosclerotic peripheral vascular disease, hyperlipidemia, who  presents today related to persistent cough and congestion x10 days.  The  patient has recently finished a 7-day course of augmenting yesterday  without any improvement in her symptoms.  She continues to have cough  with thick green mucous.  She has been using Mucinex over-the-counter  without much relief.  The patient denies any hemoptysis, orthopnea, PND  or chest pain.  Chest x-ray on November 6 showed no acute findings.   PAST MEDICAL HISTORY:  Reviewed.   CURRENT MEDICATIONS:  Reviewed.   PHYSICAL EXAMINATION:  GENERAL:  The patient is a pleasant female in no  acute distress.  VITAL SIGNS:  She is afebrile with stable vital signs.  O2 saturations  is 98% on room air.  HEENT:  Unremarkable.  NECK:  Supple without cervical adenopathy.  No JVD.  LUNGS:  Sounds reveal course breath sounds without any wheezes or  crackles.  CARDIAC:  Regular rate.  ABDOMEN:  Soft and nontender.  EXTREMITIES:  Warm without any edema.   IMPRESSION:  Slow to resolve asthmatic bronchitis.  The patient was  given Avelox times five days, Mucinex DM twice a day, prednisone taper  for the next week.  The patient is to return back with Dr. Kriste Basque as  scheduled or sooner if needed.      Rubye Oaks, NP  Electronically Signed      Lonzo Cloud. Kriste Basque, MD  Electronically Signed   TP/MedQ  DD: 05/01/2007  DT: 05/02/2007  Job #: [id number]

## 2010-10-26 NOTE — Assessment & Plan Note (Signed)
[facility]                            CARDIOLOGY OFFICE NOTE   JAWANNA, DYKMAN                        MRN:          [id number]  DATE:06/25/2007                            DOB:          [birth year]    PRIMARY CARE PHYSICIAN:  Dr. Alroy Dust.   PAST MEDICAL HISTORY:  Ms. Buesing is 70 years old and returns for  followup management of her coronary heart disease.  In October 2006 she  underwent stenting of the right coronary with a ZoMaxx stent.  She had  residual 70% narrowing at the LAD at that time.  She has been doing  reasonably well but she says over the past several months she has had  increased symptoms of shortness of breath with exertion.  These are  similar to the symptoms she had prior to her stent placement.  She says  that when she walks up stairs, she gets quite short of breath.  She has  no associated chest pain and no palpitations.   She has been under a great deal of stress.  She cares for a mother with  Alzheimer's.  Her son who was divorced from his wife and has severe back  pain has now left her living with her daughter.   PAST MEDICAL HISTORY:  Significant for hyperlipidemia and subclavian  stenosis which was treated with subclavian bypass surgery in 1998 by Dr.  Hart Rochester.  She also has hypertension.   CURRENT MEDICATIONS:  Include simvastatin, Nexium, metoprolol, Norvasc,  Plavix, fish oil, aspirin and Neurontin.   EXAMINATION:  The blood pressure 122/73 and pulse 72 and regular.  There was no venous distension.  The carotid pulses were full without  bruits.  CHEST:  Was clear without rales or rhonchi.  HEART:  Rhythm is regular rate and rhythm.  No murmurs or gallops.  ABDOMEN:  Soft with bowel sounds.  EXTREMITIES:  Positive peripheral pulses.  No peripheral edema.   Electrocardiogram today showed an old a diaphragmatic wall infarction.  This had not changed.   IMPRESSION:  1. Exertional dyspnea, possible angina  equivalent.  2. Coronary artery disease status post placement of ZoMaxx drug-      eluting stent in the right coronary in 2006 with residual 70%      narrowing in the LAD.  3. Status post surgery for subclavian stenosis.  4. Hyperlipidemia and hypertension.   RECOMMENDATIONS:  Ms. Amaro symptoms of dyspnea on exertion are  similar to what she had prior to stent placement are concerning for  recurrent ischemia.  Will plan to evaluate with rest-stress Myoview  scan.  She is not quite at target with her lipid profile but cost is an  issue, and so we will try and see if we can tighten up on her diet and  continue simvastatin rather than the previously used Vytorin.  If her  scan is negative will have to talk to her, and see if we should  evaluate symptoms further with possible pulmonary function test or even  consider cath since her symptoms are fairly impressive.  Will make a  decision after we get results of her scan.     Bruce Elvera Lennox Juanda Chance, MD, [facility]  Electronically Signed    BRB/MedQ  DD: 06/25/2007  DT: 06/25/2007  Job #: [id number]

## 2010-10-26 NOTE — Cardiovascular Report (Signed)
NAME:  Stephanie Alvarado, Stephanie Alvarado                 ACCOUNT NO.:  [id number]   MEDICAL RECORD NO.:  [id number]          PATIENT TYPE:  OIB   LOCATION:  1963                         FACILITY:  [facility]   PHYSICIAN:  Everardo Beals. Juanda Chance, MD, FACCDATE OF BIRTH:  May 09, 1941   DATE OF PROCEDURE:  04/21/2008  DATE OF DISCHARGE:  04/21/2008                            CARDIAC CATHETERIZATION   CLINICAL HISTORY:  Ms. Brierley is 70 years old and had a ZoMaxx drug-  eluting stent implanted in the right coronary in 2006.  She had residual  70% disease in the LAD at that time.  She recently has developed  increasing sense of chest tightness and shortness of breath with  exertion.  They brought her in for catheterization.  She has also been  under a great deal of stress taking care of mother who has Alzheimer's.  She has also had back and neck problems and is seeing Dr. Ethelene Hal for  that.   PROCEDURE:  The procedure was performed via the right femoral artery and  arterial sheath, 6-French pyriform coronary arterial sheath, and 4-  French pyriform coronary catheters.  A front wall arterial puncture was  performed, and Omnipaque contrast was used.  The patient tolerated the  procedure well.   During the procedure and at the end of the procedure, the patient  complained of burning in both feet.  This was persistent and did not  respond to increasing doses of Valium and Benadryl.  There was no rash  seen on the feet, however.   RESULTS:  Left main coronary artery:  The left main coronary artery was  free of significant disease.   Left anterior descending artery:  The left anterior descending artery  gave rise to a large diagonal branch, a large septal perforator, and  then, a very large second septal perforator.  At the bifurcation with  the second large septal perforator, there was an 80% narrowing just  proximal to bifurcation with what appeared to be a ruptured plaque in  the LAO projection.  There was also 80%  narrowing just after the septal  perforator contiguous with the proximal stenosis.  Septal perforator had  about 70% ostial stenosis.   Circumflex artery:  The circumflex artery gave rise to atrial branch,  small marginal branch, a large marginal branch, and a posterolateral  branch.  These vessels were free of significant disease.   Right coronary:  The right coronary was a moderate-sized vessel, gave  rise to right ventricular branch, posterior descending branch, and a  posterolateral branch.  The ZoMaxx stent in the mid right coronary had  less than 10% narrowing.  There were 40% and 30% stenosis in the  proximal vessel and 30% narrowing in the mid-to-distal vessel.   Left ventriculogram:  The left ventriculogram performed on RAO  projection showed good wall motion.  There was a slight area of mid  inferior wall hypokinesis.  Estimated ejection fraction of 60%.   HEMODYNAMIC DATA:  The aortic pressure was 123/64 with a mean of 89.  Left ventricular pressure was 123/19.   CONCLUSION:  Coronary  artery disease with 80% narrowing in the proximal  LAD and 70% ostial stenosis in the large septal perforator, no major  obstruction of circumflex artery, less than 10% narrowing at the stent  site in the mid right coronary with 40% proximal and 30% distal stenosis  in the right coronary, and minimal inferior wall hypokinesis with an  estimated ejection fraction of 60%.   RECOMMENDATIONS:  The patient has progression of the lesion in the LAD  and appears to be related to a ruptured plaque.  PCI may be somewhat  difficult because we may have to treat the large septal perforator as a  bifurcation lesion.  We also may need to consider using a bare metal  stent, and I will need to talk with Dr. Ethelene Hal if any surgery considered  for her back and neck before making a final decision about that.  Because of the foot pain and because of multiple issues involved, we  will plan to schedule a procedure  for later in the week, and currently,  we will plan to schedule on Friday.      Bruce Elvera Lennox Juanda Chance, MD, [facility]  Electronically Signed     BRB/MEDQ  D:  04/21/2008  T:  04/21/2008  Job:  [id number]   cc:   Lonzo Cloud. Kriste Basque, MD  Dr. Ethelene Hal

## 2010-10-26 NOTE — Discharge Summary (Signed)
NAME:  Stephanie Alvarado, Stephanie Alvarado                 ACCOUNT NO.:  [id number]   MEDICAL RECORD NO.:  [id number]          PATIENT TYPE:  INP   LOCATION:  3023                         FACILITY:  [facility]   PHYSICIAN:  Pramod P. Pearlean Brownie, MD    DATE OF BIRTH:  03/05/1941   DATE OF ADMISSION:  11/12/2008  DATE OF DISCHARGE:  11/17/2008                               DISCHARGE SUMMARY   DIAGNOSES AT THE TIME OF DISCHARGE:  1. Right middle cerebral artery infarct, felt to be embolic, status      post intravenous tissue plasminogen activator, though no source      found during hospitalization.  2. Incidental patent foramen ovale, status post two-third dose      intravenous tissue plasminogen activator and cerebral angiogram      with no treatable lesions.  3. 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  4. Coronary artery disease.      a.     On March 17, 2005, percutaneous coronary intervention and       stent of the right coronary artery with placement of 3.0 x 23 mm       Mac study stent.      b.     On April 25, 2008, percutaneous coronary intervention and       stenting of left anterior descending with placement of 2.75 x 80       mm Promus drug-eluting stent.  The patient has had residual 80%       stenosis in the septal perforator.  5. Chronic shortness of breath on exertion.  6. Normal left ventricular function with ejection fraction 65% by echo      on November 12, 2008.  7. Hypertension.  8. Hyperlipidemia.  9. Degenerative joint disease, status post lumbar surgery in December      2004.  10.Gastroesophageal reflux disease.  11.History of dysphagia with questionable esophageal stricture.  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   MEDICINES AT THE TIME OF DISCHARGE:  1. Norvasc 5 mg a day.  2. Neurontin 100 mg t.i.d.  3. Protonix 40 mg a day.  4. Zocor 80 mg at bedtime.  5. Imdur 30 mg at bedtime.  6.  Ambien 10 mg at bedtime.  7. Metoprolol 50 mg b.i.d.  8. Coumadin 2.5 mg a day.  9. Miconazole vaginal cream at bedtime for 4 more doses.   STUDIES PERFORMED:  1. MRI of the brain on admission shows background pattern of extensive      chronic small vessel disease throughout the brain, acute infarct in      the right frontoparietal region, 1 x 2 cm in size.  Cerebral      angiogram shows mild scattered atherosclerotic changes      intracranially involving the right middle and the right anterior      cerebral artery distributions.  Surgically transposed left common      carotid artery to arise from the left subclavian artery proximal to  the origin of the dominant left vertebral artery.  2. EKG, machine read out as atrial fibrillation, though reviewed by      cardiologist, reveals PAC.  3. 2-D echocardiogram shows EF of 65% with no source of embolus.  4. Transcranial Doppler shows low normal mean flow velocities      throughout due to technical difficulties from poor window.  Carotid      Doppler shows no significant ICA stenosis, mild left ECA stenosis.   LABORATORY STUDIES:  INR on day of discharge 2.4.  TSH 0.669.  Homocystine 7.8.  hemoglobin A1c 5.9.  Urinalysis with 3-6 red blood  cells, otherwise normal.  Chemistry with glucose 104, potassium 3.3,  otherwise normal.  Cardiac enzymes negative.  CBC normal.  Cholesterol  165, triglycerides 86, HDL 46, and LDL 102.   HISTORY OF PRESENT ILLNESS:  Stephanie Alvarado is a 70 year old Caucasian  female who presented to [facility] Emergency Department with acute onset  of left-sided numbness and weakness.  She was last seen normal at 7:30  p.m.  After evaluation and negative CT, discussion with Dr. Sharene Skeans.  The patient told her examiners that she was last seen normal at 7:30  p.m.  There was a mix up in timing.  She arrived at the hospital at 8:14  p.m. and has seen at 8:30 p.m.  She was assessed by the emergency  physician who  contacted Dr. Sharene Skeans at [phone].  Decision was made to give  her two-third TPA and transfer her to [facility] for cerebral angiogram.  Dr. Corliss Skains performed the cerebral angiogram which showed no stable  stenosis.  The patient was admitted to the neuro ICU for evaluation post  TPA.  MRI 24 hours post TPA showed no hemorrhage and the patient was  transferred out of the ICU to the floor.  Of note, the patient had  irregular heartbeat in the intensive care unit, appearing as AFib.  EKG  performed showed AFib; however, when cardiology was consulted and  reviewed strips, they felt it was indeed not atrial fibrillation, but a  regular heartbeat with frequent PACs.  In the meanwhile, the patient was  started on Coumadin for potential atrial fibrillation.  At this point,  the source of infarct is really felt to be cardioembolic, so therefore  we will discharge the patient on Coumadin at this time.  We will set up  for the outpatient CardioNet monitoring and outpatient TCD bubble study  and emboli monitoring to further evaluate the incidental small PFO, and  in the future, likely discontinue the Coumadin and place the patient  back on Plavix.  She was evaluated by PT and OT in the hospital and  initially felt to benefit from inpatient rehab.  She has shown  progression over the weekend and is now safe for discharge home.  We  would send her home with home health PT and OT for followup there.  She  has been advised not to drive.   CONDITION AT DISCHARGE:  The patient alert and oriented x3.  Speech  clear.  No aphasia.  No dysarthria.  Eye movements are full.  No drift  in her extremities.  She does have some mild left hand weakness with  some decreased fine motor movement on the left.  Heart rate is regular.  Her breath sounds are clear.   DISCHARGE PLAN:  1. Discharge to home with family.  2. Coumadin for secondary stroke prevention.  Followup appointment  with [facility] on November 19, 2008, at 2 p.m.  3. Outpatient CardioNet monitoring via [place].  They were contacted.      They will contact her.  4. TCD bubble study and emboli monitoring with Dr. Pearlean Brownie.  The patient      has been instructed to call and arrange this at discharge.  5. Outpatient followup with primary care physician within 1 month.      Annie Main, N.P.    ______________________________  Sunny Schlein. Pearlean Brownie, MD    SB/MEDQ  D:  11/17/2008  T:  11/18/2008  Job:  [id number]   cc:   Pramod P. Pearlean Brownie, MD  Everardo Beals Juanda Chance, MD, [facility]  Scott M. Kriste Basque, MD

## 2010-10-26 NOTE — Cardiovascular Report (Signed)
NAME:  Stephanie Alvarado, Stephanie Alvarado                 ACCOUNT NO.:  [id number]   MEDICAL RECORD NO.:  [id number]          PATIENT TYPE:  INP   LOCATION:  6529                         FACILITY:  [facility]   PHYSICIAN:  Everardo Beals. Juanda Chance, MD, FACCDATE OF BIRTH:  04/14/1941   DATE OF PROCEDURE:  04/25/2008  DATE OF DISCHARGE:                            CARDIAC CATHETERIZATION   CLINICAL HISTORY:  Ms. Mcclellan is 70 year old and has previous history of  a ZoMaxx drug-eluting stent implanted in the right coronary artery in  2006.  She recently developed increased chest tightness and shortness of  breath with exertion and underwent catheterization in the [facility].  She  was felt to have a significant lesion in the proximal LAD and was  brought in today for intervention.   PROCEDURE:  The procedure was followed via the right femoral artery and  arterial sheath and a 6-French Q-3.5 guiding catheter with side holes.  The patient had been given 300 mg of Plavix load earlier and additional  300 mg a day as well as four chewable aspirin.  She was given  bivalirudin bolus and infusion.  On initial pictures, the LAD lesion did  not appear quite as tight as we thought in the [facility] so we performed  intravascular ultrasound.  This demonstrated a distal reference lumen of  about 3 point distal and proximal reference lumen of about 3.0 and a  lesion just proximal to a septal perforator which was less than 2 mm in  diameter.  We made a decision that this was that enough to go ahead and  treat.  We predilated with 2.25 x 15-mm Apex balloon performing one  inflation up to 8 atmospheres for 30 seconds.  We then deployed a 2.75 x  18-mm PROMUS stent deploying this with one inflation up to 9 atmospheres  for 30 seconds.  We postdilated it with a 2.75 x 15-mm Greenfield Voyager  performing two inflations up to 17 atmospheres for 30 seconds.   FINAL DIAGNOSIS:  Status post implant with a guiding catheter.  There  was a very large septal  perforator which was pinched to about 80% .  We  elected not to dilate this through the stent.  The patient tolerated the  procedure well and left the laboratory in satisfactory condition.  The  right femoral area was closed with Angio-Seal.   RESULTS:  Initially, the stenosis was estimated at 70% although by IVUS  we thought the lesion was tighter.  Following stenting, this improved to  0%.  The diagonal branch was pinched to about 80%.   CONCLUSION:  Successful PCI of the lesion in the proximal LAD using a  PROMUS drug-eluting stent with improvement in center narrowing from 70 -  80% to 0%.   DISPOSITION:  The patient will come again for further observation.      Bruce Elvera Lennox Juanda Chance, MD, [facility]  Electronically Signed    BRB/MEDQ  D:  04/25/2008  T:  04/25/2008  Job:  [id number]   cc:   Elmon Else. Kriste Basque, MD  Cardiopulmonary Lab

## 2010-10-29 NOTE — Assessment & Plan Note (Signed)
[facility]                              CARDIOLOGY OFFICE NOTE   Stephanie, Alvarado                        MRN:          [id number]  DATE:02/03/2006                            DOB:          [birth year]    PRIMARY CARE PHYSICIAN:  Alroy Dust, M.D.   HISTORY OF PRESENT ILLNESS:  Stephanie Alvarado is 70 years old and has documented  coronary disease. She had a catheterization done in October 2006 and  underwent stenting of the right coronary artery with a Zomax study stent.  She had residual 70% narrowing in the LAD. Since that time, she has had  persistent symptoms of shortness of breath and exhaustion and fatigue with  exertion. She also has had some pain under her left breast, which occurs at  rest and is not specifically related to exertion. I had seen her in the  hospital yesterday after doing an intervention on her husband and she  related these symptoms, so we arranged for her to come in today for  evaluation. She has been under a great deal of stress recently. She has a  mother who lives with her, who has Alzheimer's who she is responsible for.  She also has a grown son, who just got divorced from his wife and is now  living with them and he tried to commit suicide recently.   PAST MEDICAL HISTORY:  Significant for hyperlipidemia and a left subclavian  stenosis, treated with subclavian bypass surgery in 1998 by Dr. Hart Rochester and  hypertension.   CURRENT MEDICATIONS:  Include Nexium, aspirin, and Zocor.   PHYSICAL EXAMINATION:  VITAL SIGNS:  Blood pressure 121/76, pulse 85 and  regular.  NECK:  There was no venous distention. The carotids were full without  bruits.  CHEST:  Clear.  HEART:  Regular rate and rhythm. No murmurs or gallops.  ABDOMEN:  Soft. No organomegaly.  EXTREMITIES:  There was trace peripheral edema and the pedal pulses were  equal.   IMPRESSION:  1. Dyspnea and fatigue with exertion, rule out anginal equivalent.  2. Coronary  artery disease status post stenting of the circumflex artery      in October of 2006 with a Zomax study stent and status post with      residual 70% narrowing of the left anterior descending and good left      ventricular function.  3. Hyperlipidemia with an LDL in the 150's.  4. Hypertension.   RECOMMENDATIONS:  I am concerned that Stephanie Alvarado's symptoms might represent  an anginal equivalent. She says she has had a pulmonary evaluation by Dr.  Kriste Basque and had pulmonary function tests in the remote past, which were okay.  She has been a smoker but it has been many years. She also had a CT scan  earlier this year, which did not show any major abnormalities. We will plan  to do an Adenosine and Myoview scan to evaluate her for ischemia. Her liver  profile is way off target and we will plan to start her on Vytorin 10/40 and  get a  Lipid and  liver in a month. Will see Dr. Corinda Gubler back in a month and will  followup on a Cardiolite next week.                               Bruce Elvera Lennox Juanda Chance, MD, [facility]    BRB/MedQ  DD:  02/03/2006  DT:  02/04/2006  Job #:  [id number]   cc:   Lonzo Cloud. Kriste Basque, MD

## 2010-10-29 NOTE — Discharge Summary (Signed)
NAME:  Stephanie Alvarado, VARBLE                           ACCOUNT NO.:  [id number]   MEDICAL RECORD NO.:  [id number]                   PATIENT TYPE:  INP   LOCATION:  3005                                 FACILITY:  [facility]   PHYSICIAN:  Danae Orleans. Venetia Maxon, M.D.               DATE OF BIRTH:  1941-01-31   DATE OF ADMISSION:  06/12/2003  DATE OF DISCHARGE:  06/20/2003                                 DISCHARGE SUMMARY   REASON FOR ADMISSION:  1. Lumbar disk herniation.  2. 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  3. Acquired spondylolisthesis.  4. Lumbosacral degenerative disk disease.  5. Esophageal reflux.  6. Hypertension.   FINAL DIAGNOSES:  1. Lumbar disk herniation.  2. 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  3. Acquired spondylolisthesis.  4. Lumbosacral degenerative disk disease.  5. Esophageal reflux.  6. Hypertension.   HISTORY OF ILLNESS AND HOSPITAL COURSE:  [name] is a 70 year old woman  who has previously had lumbar diskectomies at L4-5 and L5-S1 by another  physician, who subsequently developed spondylosis, spondylolisthesis,  degenerative disk disease and radiculopathy with severe pain.  It was  elected to take her to surgery for redo laminectomy and decompression with  fusion and this was done on June 12, 2003.  The patient did well  postoperatively, was slow to mobilize and had incisional pain.  She was  gradually mobilized on the 4th and 5th and continued to work with the  physical therapist and was doing well on the 7th and at that point, was able  to walk in her brace using a walker and was doing well at that point and she  was discharged home with instructions of no lifting, bending, twisting or  driving, to wear her brace when up and follow up with Dr. Danae Orleans. Venetia Maxon in  3 weeks with x-rays.   DISCHARGE MEDICATIONS:  She was given prescriptions for Percocet for pain  and Valium for spasms.   DISCHARGE  CONDITION:  Improved.                                                Danae Orleans. Venetia Maxon, M.D.    JDS/MEDQ  D:  07/18/2003  T:  07/19/2003  Job:  [id number]

## 2010-10-29 NOTE — Discharge Summary (Signed)
NAME:  EVIAN, DERRINGER                 ACCOUNT NO.:  [id number]   MEDICAL RECORD NO.:  [id number]          PATIENT TYPE:  OIB   LOCATION:  6532                         FACILITY:  [facility]   PHYSICIAN:  Charlies Constable, M.D. [facility] DATE OF BIRTH:  11/09/1940   DATE OF ADMISSION:  03/17/2005  DATE OF DISCHARGE:  03/18/2005                                 DISCHARGE SUMMARY   PRINCIPAL DIAGNOSIS:  Coronary artery disease.   OTHER DIAGNOSES:  1.  Hypertension.  2.  Hyperlipidemia.  3.  Remote tobacco abuse.  4.  Obesity.  5.  History of diverticulitis.  6.  Left subclavian stenosis status post carotid-subclavian bypass surgery      in 1998 by Dr. Hart Rochester.   PRIMARY CARDIOLOGIST:  Cecil Cranker, M.D.   PRIMARY CARE PHYSICIAN:  Lonzo Cloud. Kriste Basque, M.D.   ALLERGIES:  CODEINE.   PROCEDURE:  PCI and stenting of the right coronary artery with ZOMAXX study  stent.   HISTORY OF PRESENT ILLNESS:  A 70 year old white female with prior history  of hypertension and hyperlipidemia and left subclavian stenosis status post  carotid-subclavian bypass surgery in 1998 who recently saw Dr. Corinda Gubler in  clinic for a several week history of intermittent chest discomfort without  associated symptoms.  Secondary to her symptoms, she was set up for left  heart cardiac catheterization which took place as an outpatient on March 16, 2005, revealing a 90% stenosis in the proximal RCA along with 70%  stenoses in the proximal LAD and first diagonal.  Her EF was 60% on  catheterization.  Given her RCA stenosis, we decided to bring her back for  PCI.   HOSPITAL COURSE:  On March 17, 2005, Ms. Depace represented to the cath  lab and underwent successful stenting of the right coronary artery with a  3.0 x 23 mm ZOMAXX study stent performed by Dr. Juanda Chance.  She tolerated this  procedure well and post procedure, has been ambulating in the hallway this  morning without any recurrent discomfort or limitations.  She is  being  discharged home today in satisfactory condition.   DISCHARGE LABORATORY DATA:  Hemoglobin 12.6, hematocrit 36.8, wbc 10.9,  platelets 221.  Sodium 134, potassium 3.4, chloride 107, CO2 23, BUN 11,  creatinine 0.7, glucose 82.  PT 12.9, INR 1.0, PTT 25.  CK 35, MB 1.5.  Calcium 8.4.   DISPOSITION:  Patient is being discharged home today in good condition.   FOLLOW-UP PLANS AND APPOINTMENTS:  She has an appointment with Dr. Blossom Hoops  nurse practitioner physician assistant on April 04, 2005, at 2:30 p.m.  She will have follow-up with Dr. Kriste Basque within the next three to four weeks.   DISCHARGE MEDICATIONS:  1.  Lopressor 50 mg b.i.d.  2.  Prinivil 40 mg daily.  3.  Norvasc 5 mg daily.  4.  Zocor 40 mg nightly.  5.  Plavix 75 mg daily.  6.  Aspirin 81 mg daily.  7.  Celebrex 200 mg daily.  8.  Ambien 5 mg nightly p.r.n.  9.  Allegra 180 mg p.r.n.  10. Nitroglycerin 0.4 mg sublingual p.r.n. chest pain.   OUTSTANDING LAB STUDIES:  None.   DURATION OF DISCHARGE ENCOUNTER:  45 minutes including physician time.      Ok Anis, NP    ______________________________  Charlies Constable, M.D. [facility]    CRB/MEDQ  D:  03/18/2005  T:  03/18/2005  Job:  [id number]   cc:   Cecil Cranker, M.D.  [address]  [address]  [place]  [place] [address]   Lonzo Cloud. Kriste Basque, M.D. [facility]  [address]  [place]  [place] [address]

## 2010-10-29 NOTE — Assessment & Plan Note (Signed)
[facility]                            CARDIOLOGY OFFICE NOTE   KORINNE, GREENSTEIN                        MRN:          [id number]  DATE:07/12/2007                            DOB:          [birth year]    I spoke with Doyce Loose about her test results today.  Her Myoview scan  was negative with no ischemia and a normal ejection fraction.  Her  laboratory studies were normal with a normal hemoglobin and normal  kidney function and normal TSH. I suspect her shortness of breath may be  pulmonary in etiology and we have arranged for her to have pulmonary  function test and to see Dr. Kriste Basque back in follow-up. If her symptoms  are not better and Dr. Kriste Basque does not think her pulmonary disease  accounts for them then  I would like to see her back and we may consider  cardiac cath since her Myoview scan potentially could have a false  negative reading.     Bruce Elvera Lennox Juanda Chance, MD, [facility]  Electronically Signed    BRB/MedQ  DD: 07/12/2007  DT: 07/13/2007  Job #: [id number]   cc:   Lonzo Cloud. Kriste Basque, MD

## 2010-10-29 NOTE — Assessment & Plan Note (Signed)
[facility]                              CARDIOLOGY OFFICE NOTE   NAME:Stephanie Alvarado, Stephanie Alvarado                        MRN:          [id number]  DATE:03/15/2006                            DOB:          09/28/1940    CARDIOLOGY FOLLOW UP NOTE:  The patient is a very pleasant 70 year old obese  white female with documented coronary artery disease and is on part of the  Zomax study with a Zomax study stent of the right coronary artery placed in  October 2006 per Dr.  Juanda Chance.  She had a residual 70% narrowing in the left  anterior descending.  Recently she was seen by Dr. Juanda Chance with some  complaints and fatigue with exertion, and because of the possibility of an  anginal equivalent she underwent stress testing which revealed no ischemia.  She also has a history of hypertension and hyperlipidemia.  The patient has  also been under a significant amount of stress as noted in Dr. Regino Schultze  note.   MEDICATIONS:  1. Nexium 40 mg.  2. Vytorin 10/40 mg.  3. Metoprolol 50 twice a day.  4. Prinvil.  5. Norvasc 5 mg.  6. Plavix 75 mg.  7. Celebrex 200 mg.  8. Aspirin 325 mg.  9. Flexeril.   LABORATORY DATA:  Most recent LDL was 157 in June; I believe that was prior  to starting the Vytorin.  She was previously on Zocor.   PHYSICAL EXAMINATION:  VITAL SIGNS:  The patient has continued to gain  weight is now up to 215 pounds.  The patient's blood pressure  is 112/72.  Pulse 81.  GENERAL APPEARANCE:  The patient's general appearance is normal, except for  significant obesity.  NECK:  Jugular venous pressure is elevated  carotid pulses are palpable  without bruits.  LUNGS:  The lungs are clear,  HEART:  Cardiac exam is unremarkable.  EXTREMITIES:  The extremities reveal no edema.   LABORATORY DATA:  The electrocardiogram is normal.   IMPRESSION:  Dyspnea on exertion;  here to determine the odds; they are  probably multifactorial.  I doubt that this is related to  her heart.   RECOMMENDATIONS:  I have suggested the following:  1. Lipid profile.  2. The patient will follow up with Dr. Kriste Basque.  3. I will see her back in four months.            ______________________________  E. Graceann Congress, MD, [facility]     EJL/MedQ  DD:  03/15/2006  DT:  03/17/2006  Job #:  [id number]

## 2010-10-29 NOTE — Op Note (Signed)
NAME:  Stephanie Alvarado, Stephanie Alvarado                           ACCOUNT NO.:  [id number]   MEDICAL RECORD NO.:  [id number]                   PATIENT TYPE:  INP   LOCATION:  3005                                 FACILITY:  [facility]   PHYSICIAN:  Danae Orleans. Venetia Maxon, M.D.               DATE OF BIRTH:  04-Sep-1940   DATE OF PROCEDURE:  06/12/2003  DATE OF DISCHARGE:                                 OPERATIVE REPORT   PREOPERATIVE DIAGNOSES:  1. 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  2. Spondylolisthesis.  3. Degenerative disk disease.  4. Radiculopathy.   POSTOPERATIVE DIAGNOSES:  1. 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  2. Spondylolisthesis.  3. Degenerative disk disease.  4. Radiculopathy.   PROCEDURES:  1. Redo laminectomy L4-5 and L5-S1 levels and three deep diskectomies at     these levels.  2. Transverse lumbar interbody fusion with Synthes 7 mm bone Allograft     spacers and morselized autograft.  3. Pedicle screw fixation at L4 through S1 bilaterally.  4. Posterolateral arthrodesis with L4 through S1 bilaterally with morselized     autograft, Allograft, and Symphony platelet concentrate.   SURGEON:  Danae Orleans. Venetia Maxon, M.D.   ASSISTANT:  Payton Doughty, M.D.   ANESTHESIA:  General endotracheal anesthesia.   ESTIMATED BLOOD LOSS:  1200 mL with 400 mL of the cell saver blood returned  to the patient.   COMPLICATIONS:  None.   DISPOSITION:  Recovery.   INDICATIONS:  Stephanie Alvarado is a 70 year old woman, who had previously  undergone a lumbar laminectomy in 1994 by Dr. Darrelyn Hillock.  She had repeat MRI  which shows laminectomy defects and spondylolisthesis of L4 and L5 with disk  space narrowing and significant nerve root compression at the L4-5 and L5-S1  levels.  Then it was elected to take her to surgery for decompression and  fusion of these affected levels.   DESCRIPTION OF PROCEDURE:  Stephanie Alvarado was brought to the  operating room.  Following a satisfactory and uncomplicated induction of general endotracheal  anesthesia, we placed intravenous lines and Foley catheter.  She was placed  in the prone position on the operating table, and her low back was then  shaved, prepped, and draped, prep and drape in the usual sterile fashion.  The planned incision was infiltrated with 0.25% Marcaine, 0.5% lidocaine  with 1:200,000 epinephrine.   An incision was made in the midline.  Previous scar was removed, and  incision was carried to the lumbodorsal fascia which was incised  bilaterally; subperiosteal dissection was performed, exposing the L4, L5  transverse processes in the sacral ala bilaterally.  Self-retaining  retractor was placed to facilitate exposure, and intraoperative x-ray  confirmed correct level at the L4 with marker probes at the L4 and L5  transverse processes.  Subsequently, the laminectomy of  L4 and L5 was  performed.  This was very carefully taken through scar tissue at both of  these previously-operated levels on the left, and the L4, L5, and S1 nerve  roots and then the common neural tube were carefully decompressed under  loupe magnification.  The disk spaces were high degenerated at both of these  levels, and they were approached from the right side which had less  scarring, and it was easier to mobilize the nerve roots from this side.   It was elected to perform distraction by placing screws on the left side  after decompression was performed, and this was done using DePuy pedicle  screw system, 40 mm x 6 mm screws were placed at L5 and S1, and a 45 x 6 mm  screw was placed into L4.  These were then used to perform parallel  distraction to re-open the interspaces and initially at the L4-5 level on  the right, after trial sizing and evacuating residual disk and cartilaginous  material from the interspace, the 7 mm Synthes Allograft bone graft Q-lift  spacer was then inserted and  countersunk appropriately.   A diskectomy was also performed on the left side as well to decompress both  the nerve roots.  A similarly sized spacer was placed under distraction at  the L5-S1 level, and their position was confirmed under fluoroscopic  visualization.  Subsequently pedicle screws were placed on the right side,  and 65 mm rods were placed on each side.  The positioning of the screws was  confirmed on the AP and lateral fluoroscopy.  The transverse processes of L4  and L5 and the sacral alae were decorticated bilaterally.  Allograft  reconstituted to the Symphony platelet concentrate was placed on the right  side, and on the left side morcellated bone autograft and bone saved from  the drillings at the time of surgery was then used as bone graft on the left  side.  Morselized bone autograft was placed overlying the interbody spacers,  and this was tamped into position at both levels.  Nerve roots were felt to  be well decompressed, and the rods were locked down in situ.  The wound was  irrigated prior to placing the bone graft with no evidence of any CSF leak  and common neural tube and nerve roots appeared to be well decompressed.  The self-retaining retractor was then removed.   The deep fascial layer was reapproximated with #1 Vicryl suture;  subcutaneous tissues were reapproximated with 2-0 Vicryl interrupted  inverted sutures, and the skin edges were reapproximated with interrupted 3-  0 Vicryl subcuticular stitch.  The wound was dressed with Benzoin and Steri-  Strips, Telfa gauze, and tape.   The patient was extubated in the operating room and taken to the recovery  room in stable and satisfactory condition having tolerated the operation  well.  All counts were correct at the end of the case.                                               Danae Orleans. Venetia Maxon, M.D.    JDS/MEDQ  D:  06/12/2003  T:  06/12/2003  Job:  [id number]

## 2010-10-29 NOTE — Cardiovascular Report (Signed)
NAME:  Stephanie Alvarado, Stephanie Alvarado NO.:  [id number]   MEDICAL RECORD NO.:  [id number]          PATIENT TYPE:  OIB   LOCATION:  2899                         FACILITY:  [facility]   PHYSICIAN:  Charlies Constable, M.D. [facility] DATE OF BIRTH:  [birth year]   DATE OF PROCEDURE:  03/17/2005  DATE OF DISCHARGE:                              CARDIAC CATHETERIZATION   CLINICAL HISTORY:  Mrs. Hidrogo is 70 years old and has had previous carotid  subclavian bypass surgery by Dr. Hart Rochester.  She developed symptoms of  shortness of breath and was seen in consultation by Dr. Glennon Hamilton who  arranged for her to be evaluated with coronary angiography which was  performed yesterday in the outpatient laboratory.  She had a very tight  lesion in the mid right coronary artery and was brought back today for  intervention.  She also has hypertension and hyperlipidemia.   PROCEDURE:  The procedure was performed via the left femoral artery using  arterial sheath and 6-French preformed coronary catheters.  A front wall  arterial puncture was performed and Omnipaque contrast was used.  Patient  was given Angiomax bolus and infusion and was given an extra 300 mg load of  Plavix.  She was also pre medicated for a possible dye allergy with Pepcid  and prednisone.   We chose a JR4 6-French guiding catheter with side holes.  We crossed the  lesion in the mid right coronary artery with a Prowater wire with a little  bit of difficulty.  We then pre dilated with a 3 x 8 mm Maverick performing  two inflations up to 8 atmospheres for 30 seconds.  The patient was enrolled  in the [facility] trial and was randomized to a ZOMAX study stent.  We chose a 3  x 23 mm stent and deployed this with one inflation up to 12 atmospheres for  30 seconds.  We chose a long length because in the RAO view there appeared  to be a split in the vessel extending distal to the tight stenosis.  We then  post dilated with a 3.25 x 20 mm Quantum Maverick  performing two inflations  up to 15 atmospheres for 30 seconds.  Final diagnostic studies were then  performed through the guiding catheter.  The patient tolerated the procedure  well and left the laboratory in satisfactory condition.   RESULTS:  Initially the stenosis in the mid right coronary artery was  estimated at 90%.  Following stenting this improved to less than 10%.   CONCLUSIONS:  Successful PCI of the lesion in the mid right coronary artery  using a ZOMAX drug-eluting study stent with improvement in center of  narrowing from 90% to 0%.   DISPOSITION:  Patient returned to postanesthesia care unit for further  observation.           ______________________________  Charlies Constable, M.D. [facility]     BB/MEDQ  D:  03/17/2005  T:  03/17/2005  Job:  [id number]   cc:   Lonzo Cloud. Kriste Basque, M.D. [facility]  [address]  [place]  [place] [address]  Cecil Cranker, M.D.  [address]  [address]  [place]  [place] [address]   [facility]

## 2010-10-29 NOTE — Cardiovascular Report (Signed)
NAME:  Stephanie Alvarado, Stephanie Alvarado NO.:  [id number]   MEDICAL RECORD NO.:  [id number]          PATIENT TYPE:  OIB   LOCATION:  1963                         FACILITY:  [facility]   PHYSICIAN:  Charlies Constable, M.D. [facility] DATE OF BIRTH:  October 01, 1940   DATE OF PROCEDURE:  03/16/2005  DATE OF DISCHARGE:                              CARDIAC CATHETERIZATION   CLINICAL HISTORY:  [name] is 70 years old and has had previous carotid  subclavian bypass by Dr. Hart Rochester. She recently has had symptoms of shortness  of breath with exertion and was seen in consultation by Dr. Glennon Hamilton. He  thought that her symptoms might be indicative of ischemia and arranged for  her to be evaluated for coronary angiography. She also has hypertension and  hyperlipidemia.   PROCEDURE NOTE:  The procedure was via the right femoral artery using  arterial sheath and 4-French preformed coronary catheters. A front wall  arterial puncture was performed and Omnipaque contrast was used. The patient  tolerated the procedure well and left the laboratory in satisfactory  condition.   RESULTS:  The aortic pressure was 163/87 with a mean of 120 and left  ventricular pressure was 163/20.   There was no left main coronary artery since there were separate of the  ostia of the LAD and circumflex artery.   The left anterior descending artery had moderate calcification. It did give  rise to a moderately large septal perforator and then a very large septal  perforator with three separate side branches. There was 70% proximal  stenosis in the LAD with calcification. There was 70% ostial stenosis of the  first septal perforator. The distal vessel was irregular, but free of major  obstruction.   The circumflex gave rise to an atrial branch, a small marginal branch, a  large marginal branch, and a posterolateral branch. There was some  irregularity, but these vessels were free of significant disease.   The right coronary artery  was a moderate size and gave rise to a right  ventricular branch, posterior descending branch, and a posterolateral  branch. There was 90% narrowing in the mid right coronary artery just distal  to the right ventricular branch. There appeared to a ruptured plaque. There  were tandem 30% stenoses distal to this lesion.   The left ventriculogram was performed in the RAO projection and showed good  wall motion with no areas of hypokinesis. The estimated ejection fraction is  60%.   CONCLUSION:  Coronary artery disease with 70% narrowing in the proximal left  anterior descending artery, no significant obstruction of the circumflex  artery, 90% stenosis in the mid right coronary artery, and minimal left  ventricular function.   RECOMMENDATIONS:  The lesion in the right coronary artery is quite tight and  I feel confident that this is responsible for the patient's symptoms. Will  plan to arrange for the patient to come in tomorrow for PCI.           ______________________________  Charlies Constable, M.D. [facility]     BB/MEDQ  D:  03/16/2005  T:  03/16/2005  Job:  [id number]   cc:   Lonzo Cloud. Kriste Basque, M.D. [facility]  [address]  [place]  [place] [address]   Cardiopulmonary Lab   Cecil Cranker, M.D.  [address]  [address]  [place]  [place] [address]

## 2010-11-18 ENCOUNTER — Telehealth: Payer: Self-pay | Admitting: Pulmonary Disease

## 2010-11-18 MED ORDER — AMOXICILLIN-POT CLAVULANATE 875-125 MG PO TABS: 1.0000 | ORAL_TABLET | Freq: Two times a day (BID) | ORAL | Status: AC

## 2010-11-18 NOTE — Telephone Encounter (Signed)
Prod cough with green mucus, sore throat, wheezing, head congestion, tightness in chest x4days - reports cleaning with clorox 5 days ago.  [facility].    Allergies  Allergen Reactions  . Codeine     REACTION: itch    Dr Kriste Basque, please advise, thanks!

## 2010-11-18 NOTE — Telephone Encounter (Signed)
Called spoke with patient, advised of SN recs.  Pt verbalized her understanding and will call back if no better for ov.  rx sent to verified pharmacy.

## 2010-11-18 NOTE — Telephone Encounter (Signed)
Per SN----augmentin 875   #14  1 po bid until gone, increase the mucinex 2 po bid with plenty of fluids and ov if not better.  thanks

## 2010-12-15 ENCOUNTER — Other Ambulatory Visit: Payer: Self-pay | Admitting: Pulmonary Disease

## 2010-12-29 ENCOUNTER — Telehealth: Payer: Self-pay | Admitting: Pulmonary Disease

## 2010-12-29 DIAGNOSIS — R0602 Shortness of breath: Secondary | ICD-10-CM

## 2010-12-29 NOTE — Telephone Encounter (Signed)
Okay per Leigh to place order for ONO.  Order placed in epic for ONO on RA w/ [facility].

## 2010-12-29 NOTE — Telephone Encounter (Signed)
Called, spoke with pt.  States she uses o2 qhs and this needs to be re certified per [facility].  Dr. Kriste Basque, pls advise if ok to send order for this. Thanks!

## 2010-12-29 NOTE — Telephone Encounter (Signed)
Pt only uses o2 at [facility], so they are needing order for ONO on RA to re qualify her, thanks

## 2010-12-29 NOTE — Telephone Encounter (Signed)
Need to find out if [facility] can recert pt or if she will need to come in for recertification.  thanks

## 2011-01-20 ENCOUNTER — Other Ambulatory Visit: Payer: Self-pay | Admitting: Cardiology

## 2011-01-20 NOTE — Telephone Encounter (Signed)
[facility] 90 day supply

## 2011-01-21 MED ORDER — CLOPIDOGREL BISULFATE 75 MG PO TABS: 75.0000 mg | ORAL_TABLET | Freq: Every day | ORAL | Status: DC

## 2011-03-03 ENCOUNTER — Other Ambulatory Visit: Payer: Self-pay | Admitting: Pulmonary Disease

## 2011-03-11 ENCOUNTER — Encounter: Payer: Self-pay | Admitting: Cardiology

## 2011-03-13 ENCOUNTER — Encounter: Payer: Self-pay | Admitting: Cardiology

## 2011-03-13 DIAGNOSIS — I2510 Atherosclerotic heart disease of native coronary artery without angina pectoris: Secondary | ICD-10-CM | POA: Insufficient documentation

## 2011-03-13 DIAGNOSIS — I739 Peripheral vascular disease, unspecified: Secondary | ICD-10-CM

## 2011-03-13 DIAGNOSIS — I700 Atherosclerosis of aorta: Secondary | ICD-10-CM | POA: Insufficient documentation

## 2011-03-13 DIAGNOSIS — R9430 Abnormal result of cardiovascular function study, unspecified: Secondary | ICD-10-CM | POA: Insufficient documentation

## 2011-03-14 ENCOUNTER — Ambulatory Visit (INDEPENDENT_AMBULATORY_CARE_PROVIDER_SITE_OTHER): Payer: Medicare Other | Admitting: Cardiology

## 2011-03-14 ENCOUNTER — Encounter: Payer: Self-pay | Admitting: Cardiology

## 2011-03-14 DIAGNOSIS — I2510 Atherosclerotic heart disease of native coronary artery without angina pectoris: Secondary | ICD-10-CM

## 2011-03-14 DIAGNOSIS — I779 Disorder of arteries and arterioles, unspecified: Secondary | ICD-10-CM

## 2011-03-14 DIAGNOSIS — R002 Palpitations: Secondary | ICD-10-CM

## 2011-03-14 DIAGNOSIS — I739 Peripheral vascular disease, unspecified: Secondary | ICD-10-CM

## 2011-03-14 DIAGNOSIS — Q211 Atrial septal defect: Secondary | ICD-10-CM | POA: Insufficient documentation

## 2011-03-14 MED ORDER — NITROGLYCERIN 0.4 MG SL SUBL: 0.4000 mg | SUBLINGUAL_TABLET | SUBLINGUAL | Status: DC | PRN

## 2011-03-14 MED ORDER — CLOPIDOGREL BISULFATE 75 MG PO TABS: 75.0000 mg | ORAL_TABLET | Freq: Every day | ORAL | Status: DC

## 2011-03-14 NOTE — Assessment & Plan Note (Signed)
Patient has had some palpitations in the morning.  I am not convinced that this represents a significant arrhythmia.  She and I both agree that a Holter monitor is not needed at this time.

## 2011-03-14 NOTE — Assessment & Plan Note (Signed)
Patient has history of subclavian surgery in the past.  She has some turbulent flow in the left subclavian by Doppler historically.

## 2011-03-14 NOTE — Assessment & Plan Note (Signed)
Coronary artery disease is stable.  She had a drug-eluting stent to the LAD in 2011.  There was a JL large septal perforator.  She had a nuclear scan after this showing anterior breast attenuation but no definite ischemia.  His anatomy will have to be kept in mind.

## 2011-03-14 NOTE — Patient Instructions (Signed)
Your physician recommends that you schedule a follow-up appointment in: 6 months. You will receive a letter in the mail in about 1- 2 months in advance. If you don't receive this letter, please contact our office to schedule your appointment.  Your physician has requested that you have a carotid duplex. This test is an ultrasound of the carotid arteries in your neck. It looks at blood flow through these arteries that supply the brain with blood. Allow one hour for this exam. There are no restrictions or special instructions.  Your physician recommends that you continue on your current medications as directed. Please refer to the Current Medication list given to you today.

## 2011-03-14 NOTE — Progress Notes (Signed)
HPI The patient is seen today for cardiology evaluation and to establish with me as her cardiologist.  She had been followed for many years by Dr.Brodie, who has retired.  The patient is stable.  She has a significant vascular history.  This includes coronary artery disease.  She has had stents placed in the past.  Her last nuclear scan was in 2011 revealing no ischemia.  She also has vascular disease affecting the subclavian.  She tells me that she had surgery by Dr. Hart Rochester many years ago.  She also had some type of neurologic event in June, 2010.  Transesophageal echo was done.  She had no left atrial clot.  It was thought that her event may have been related to atherosclerotic disease of the aorta.  Recently the patient has not had any chest pain or shortness of breath.  She's had some difficulties sleeping.  She is noted at times in the morning and when she has not slept well she might feel some palpitations.  These are nonsustained.  She has not had any during the day when she is up and around.  The patient also mentioned this very rare intermittent tingling along the left side of her mouth.  With her history of vascular disease she is of course concerned that this might represent some type of vascular event.   Allergies  Allergen Reactions  . Codeine     REACTION: itch    Current Outpatient Prescriptions  Medication Sig Dispense Refill  . amLODipine (NORVASC) 5 MG tablet Take 5 mg by mouth daily.        Marland Kitchen aspirin 81 MG tablet Take 81 mg by mouth daily.        Marland Kitchen b complex vitamins tablet Take 1 tablet by mouth daily.        . clopidogrel (PLAVIX) 75 MG tablet Take 1 tablet (75 mg total) by mouth daily.  90 tablet  3  . dicyclomine (BENTYL) 20 MG tablet Take 20 mg by mouth every 6 (six) hours as needed.        . fexofenadine (ALLEGRA) 180 MG tablet Take 180 mg by mouth daily.        . furosemide (LASIX) 40 MG tablet Take 40 mg by mouth daily.       Marland Kitchen gabapentin (NEURONTIN) 300 MG capsule  Take 300 mg by mouth 3 (three) times daily.       . isosorbide mononitrate (IMDUR) 30 MG 24 hr tablet TAKE 1 TABLET DAILY  90 tablet  3  . LORazepam (ATIVAN) 0.5 MG tablet Take 0.5 mg by mouth as needed.        . metoprolol (LOPRESSOR) 50 MG tablet Take 50 mg by mouth 2 (two) times daily.       . Multiple Vitamin (MULTIVITAMIN) tablet Take 1 tablet by mouth daily. womens multivitamin plus tabs       . nitroGLYCERIN (NITROSTAT) 0.4 MG SL tablet Place 0.4 mg under the tongue every 5 (five) minutes as needed.        . pantoprazole (PROTONIX) 40 MG tablet TAKE 1 TABLET TWICE A DAY  180 tablet  0  . simvastatin (ZOCOR) 40 MG tablet Take 40 mg by mouth at bedtime.        Marland Kitchen zolpidem (AMBIEN) 10 MG tablet Take 10 mg by mouth at bedtime as needed.          History   Social History  . Marital Status: Married    Spouse Name:  N/A    Number of Children: N/A  . Years of Education: N/A   Occupational History  . Retired    Social History Main Topics  . Smoking status: Former Smoker -- 1.0 packs/day for 40 years    Types: Cigarettes    Quit date: 06/13/1998  . Smokeless tobacco: Never Used  . Alcohol Use: No  . Drug Use: No  . Sexually Active: Not on file   Other Topics Concern  . Not on file   Social History Narrative  . No narrative on file    Family History  Problem Relation Age of Onset  . Heart disease Mother   . Heart disease Father   . Breast cancer      cousions    Past Medical History  Diagnosis Date  . Hypertension   . CAD (coronary artery disease)     DES and circumflex 2006 /  DES to LAD 2011  . Hyperlipemia   . Hiatal hernia   . Esophageal reflux   . Lumbar disc disease   . Allergic rhinitis, cause unspecified   . Sleep related hypoventilation/hypoxemia in conditions classifiable elsewhere   . Shortness of breath   . Other diseases of lung, not elsewhere classified   . Chronic diastolic heart failure   . Peripheral vascular disease, unspecified   . Unspecified  venous (peripheral) insufficiency   . Pure hypercholesterolemia   . Obesity, unspecified   . Cyst of thyroid   . Diverticulosis of colon (without mention of hemorrhage)   . Personal history of colonic polyps     ADENOMATOUS POLYP  . Osteoarthrosis, unspecified whether generalized or localized, unspecified site   . Lumbago   . Nonspecific abnormal finding in stool contents   . Unspecified cerebral artery occlusion with cerebral infarction   . Anxiety state, unspecified   . Anemia, unspecified   . Stroke 11/2008    Treated with TPA? /     2010, TEE no left atrial clot, probably from atherosclerosis of the aortic arch  . Ejection fraction      EF 60%, Echo, November 12, 2008, /  EF 55%, TEE, November 17, 2008  . Atherosclerosis of aorta     TEE, June, 2010, grade 4 aortic arch atherosclerosis , Dr. Shirlee Latch  . Dysphasia     Possibly esophageal stricture  . Carotid artery disease     Doppler, November, 2011, stable, 0-39% bilateral, turbulent flow left subclavian  . Palpitations     October, 2012  . Subclavian artery disease     Remote surgery by Dr. Betti Cruz.    Past Surgical History  Procedure Date  . Coronary angioplasty with stent placement 1990's  . Laparoscopic hysterectomy   . Urethral suspension 1993    Dr. Jennette Kettle  . Lumbar disc surgery     X 3  . Cholecystectomy     ROS Patient denies fever, chills, headache, sweats, rash, change in vision, change in hearing, chest pain, cough, nausea vomiting, urinary symptoms.  All other systems are reviewed and are negative.   PHYSICAL EXAM Patient is stable today.  She is oriented to person time and place.  Affect is normal.  Head is atraumatic.  There is no xanthelasma.  There is no obvious neurologic abnormality affecting her face musculature.  There is a left carotid bruit.  Lungs are clear respiratory effort is nonlabored.  Cardiac exam reveals S1-S2.  There are no clicks or significant murmurs.  The abdomen is soft.  There  is no  significant peripheral edema.  There are no musculoskeletal deformities.  There no skin rashes.  Filed Vitals:   03/14/11 1412  BP: 107/70  Pulse: 65  Height: 5\' 9"  (1.753 m)  Weight: 209 lb (94.802 kg)    EKG is done today reviewed by me.There is sinus rhythm.  There are no diagnostic abnormalities.  ASSESSMENT & PLAN

## 2011-03-14 NOTE — Assessment & Plan Note (Signed)
The patient has a left carotid bruit.  It is possible that the stone may be related to the turbulent flow that she has in her left subclavian.  She's had slight symptoms affecting her left face.  We will be sure that she has a followup yearly carotid Doppler arranged.  Otherwise no other workup.

## 2011-03-15 LAB — BASIC METABOLIC PANEL
BUN: 13
CO2: 25
Calcium: 9.2
Calcium: 9.4
Creatinine, Ser: 0.69
Creatinine, Ser: 0.71
GFR calc Af Amer: >60
GFR calc non Af Amer: >60
Glucose, Bld: 99
Sodium: 141

## 2011-03-15 LAB — PROTIME-INR
INR: 1
Prothrombin Time: 13.2

## 2011-03-15 LAB — CBC
Hemoglobin: 13.1
MCHC: 33.4
Platelets: 215
Platelets: 221
RBC: 4.19
RDW: 14.6
WBC: 6.2

## 2011-03-23 ENCOUNTER — Encounter (INDEPENDENT_AMBULATORY_CARE_PROVIDER_SITE_OTHER): Payer: Medicare Other | Admitting: *Deleted

## 2011-03-23 DIAGNOSIS — I6529 Occlusion and stenosis of unspecified carotid artery: Secondary | ICD-10-CM

## 2011-07-22 DIAGNOSIS — R079 Chest pain, unspecified: Secondary | ICD-10-CM

## 2012-03-02 ENCOUNTER — Encounter: Payer: Self-pay | Admitting: Gastroenterology

## 2012-03-19 DIAGNOSIS — F32A Depression, unspecified: Secondary | ICD-10-CM | POA: Diagnosis present

## 2012-03-30 ENCOUNTER — Encounter: Payer: Self-pay | Admitting: Cardiology

## 2012-04-03 ENCOUNTER — Ambulatory Visit (INDEPENDENT_AMBULATORY_CARE_PROVIDER_SITE_OTHER): Payer: Medicare Other | Admitting: Cardiology

## 2012-04-03 ENCOUNTER — Encounter: Payer: Self-pay | Admitting: Cardiology

## 2012-04-03 VITALS — BP 116/69 | HR 59 | Wt 181.0 lb

## 2012-04-03 DIAGNOSIS — I779 Disorder of arteries and arterioles, unspecified: Secondary | ICD-10-CM

## 2012-04-03 DIAGNOSIS — I2510 Atherosclerotic heart disease of native coronary artery without angina pectoris: Secondary | ICD-10-CM

## 2012-04-03 DIAGNOSIS — I10 Essential (primary) hypertension: Secondary | ICD-10-CM

## 2012-04-03 DIAGNOSIS — R002 Palpitations: Secondary | ICD-10-CM

## 2012-04-03 MED ORDER — PANTOPRAZOLE SODIUM 40 MG PO TBEC: 40.0000 mg | DELAYED_RELEASE_TABLET | Freq: Every day | ORAL | Status: DC

## 2012-04-03 MED ORDER — GABAPENTIN 300 MG PO CAPS: 300.0000 mg | ORAL_CAPSULE | Freq: Three times a day (TID) | ORAL | Status: DC

## 2012-04-03 MED ORDER — MELOXICAM 15 MG PO TABS: 15.0000 mg | ORAL_TABLET | Freq: Every day | ORAL | Status: DC

## 2012-04-03 MED ORDER — ZOLPIDEM TARTRATE 10 MG PO TABS
10.0000 mg | ORAL_TABLET | Freq: Every evening | ORAL | Status: DC | PRN
Start: 2012-04-03 — End: 2013-11-29

## 2012-04-03 NOTE — Assessment & Plan Note (Signed)
Coronary disease is stable. The patient had a nuclear study 2011. There was no ischemia. No further workup needed.

## 2012-04-03 NOTE — Assessment & Plan Note (Signed)
Blood pressure is controlled. No change in therapy. 

## 2012-04-03 NOTE — Assessment & Plan Note (Signed)
She is not having any significant palpitations. No change in therapy. 

## 2012-04-03 NOTE — Assessment & Plan Note (Signed)
The patient has mild carotid disease and history of subclavian surgery in the past. It is time for her yearly followup carotid Doppler. This will be scheduled.

## 2012-04-03 NOTE — Patient Instructions (Addendum)
Your physician has requested that you have a carotid duplex. This test is an ultrasound of the carotid arteries in your neck. It looks at blood flow through these arteries that supply the brain with blood. Allow one hour for this exam. There are no restrictions or special instructions.  Your physician wants you to follow-up in: 1 year.  You will receive a reminder letter in the mail two months in advance. If you don't receive a letter, please call our office to schedule the follow-up appointment.  

## 2012-04-03 NOTE — Progress Notes (Signed)
Patient ID: Stephanie Alvarado, female   DOB: 11/21/1940, 71 y.o.   MRN: [id number]   HPI  The patient is seen today to followup vascular disease. I took over her care after Dr.Brodie Retired. She's not having any coronary symptoms. She has had 2 stents in the past. She's had vascular surgery affecting the left common carotid and these left subclavian in the past. She also had a neurologic event in June, 2010. TEE was done. It is thought that there may have been some atherosclerotic disease of the aorta. Overall she's doing well. She denies chest pain shortness of breath syncope or presyncope.  Patient had evaluation for her eyes at [facility] recently. They heard a bruit in her neck and suggested followup. I explained that this bruit is probably related to her prior surgery. It is time for her followup Doppler. Her last Doppler done March 23, 2011 revealed 0-39% bilateral disease. The end to side anastomosis of the LCCA and left subclavian artery was patent .  Allergies  Allergen Reactions  . Codeine     REACTION: itch    Current Outpatient Prescriptions  Medication Sig Dispense Refill  . amLODipine (NORVASC) 5 MG tablet Take 5 mg by mouth daily.        Marland Kitchen atorvastatin (LIPITOR) 40 MG tablet Take 1 tablet by mouth Daily.      Marland Kitchen buPROPion (WELLBUTRIN XL) 150 MG 24 hr tablet 2 tabs po qd      . clopidogrel (PLAVIX) 75 MG tablet Take 1 tablet (75 mg total) by mouth daily.  90 tablet  3  . furosemide (LASIX) 40 MG tablet Take 40 mg by mouth daily.       Marland Kitchen gabapentin (NEURONTIN) 300 MG capsule Take 300 mg by mouth 3 (three) times daily.       . isosorbide mononitrate (IMDUR) 30 MG 24 hr tablet TAKE 1 TABLET DAILY  90 tablet  3  . LORazepam (ATIVAN) 0.5 MG tablet Take 0.5 mg by mouth as needed.        . meloxicam (MOBIC) 15 MG tablet Take 1 tablet by mouth Daily.      . metoprolol (LOPRESSOR) 50 MG tablet Take 50 mg by mouth 2 (two) times daily.       . Multiple Vitamin (MULTIVITAMIN) tablet Take 1 tablet  by mouth daily. womens multivitamin plus tabs       . nitroGLYCERIN (NITROSTAT) 0.4 MG SL tablet Place 1 tablet (0.4 mg total) under the tongue every 5 (five) minutes as needed.  25 tablet  3  . pantoprazole (PROTONIX) 40 MG tablet TAKE 1 TABLET TWICE A DAY  180 tablet  0  . zolpidem (AMBIEN) 10 MG tablet Take 10 mg by mouth at bedtime as needed.          History   Social History  . Marital Status: Married    Spouse Name: N/A    Number of Children: N/A  . Years of Education: N/A   Occupational History  . Retired    Social History Main Topics  . Smoking status: Former Smoker -- 1.0 packs/day for 40 years    Types: Cigarettes    Quit date: 06/13/1998  . Smokeless tobacco: Never Used  . Alcohol Use: No  . Drug Use: No  . Sexually Active: Not on file   Other Topics Concern  . Not on file   Social History Narrative  . No narrative on file    Family History  Problem Relation Age  of Onset  . Heart disease Mother   . Heart disease Father   . Breast cancer      cousions    Past Medical History  Diagnosis Date  . Hypertension   . CAD (coronary artery disease)     DES and circumflex 2006 /  DES to LAD 2011  . Hyperlipemia   . Hiatal hernia   . Esophageal reflux   . Lumbar disc disease   . Allergic rhinitis, cause unspecified   . Sleep related hypoventilation/hypoxemia in conditions classifiable elsewhere   . Shortness of breath   . Other diseases of lung, not elsewhere classified   . Chronic diastolic heart failure   . Peripheral vascular disease, unspecified   . Unspecified venous (peripheral) insufficiency   . Pure hypercholesterolemia   . Obesity, unspecified   . Cyst of thyroid   . Diverticulosis of colon (without mention of hemorrhage)   . Personal history of colonic polyps     ADENOMATOUS POLYP  . Osteoarthrosis, unspecified whether generalized or localized, unspecified site   . Lumbago   . Nonspecific abnormal finding in stool contents   . Unspecified  cerebral artery occlusion with cerebral infarction   . Anxiety state, unspecified   . Anemia, unspecified   . Stroke 11/2008    Treated with TPA? /     2010, TEE no left atrial clot, probably from atherosclerosis of the aortic arch  . Ejection fraction      EF 60%, Echo, November 12, 2008, /  EF 55%, TEE, November 17, 2008  . Atherosclerosis of aorta     TEE, June, 2010, grade 4 aortic arch atherosclerosis , Dr. Shirlee Latch  . Dysphasia     Possibly esophageal stricture  . Carotid artery disease     Doppler, November, 2011, stable, 0-39% bilateral, turbulent flow left subclavian  . Palpitations     October, 2012  . Subclavian artery disease     Remote surgery by Dr. Betti Cruz.  Marland Kitchen PFO (patent foramen ovale)     Patient had a very small PFO by TEE 2010.  This was seen only by bubble analysis during Valsalva    Past Surgical History  Procedure Date  . Coronary angioplasty with stent placement 1990's  . Laparoscopic hysterectomy   . Urethral suspension 1993    Dr. Jennette Kettle  . Lumbar disc surgery     X 3  . Cholecystectomy     Patient Active Problem List  Diagnosis  . COLONIC POLYPS  . THYROID CYST  . HYPERCHOLESTEROLEMIA  . OBESITY  . ANEMIA  . ANXIETY  . SLEEP RELATED HYPOVENTILATION/HYPOXEMIA CCE  . HYPERTENSION  . VENOUS INSUFFICIENCY  . ALLERGIC RHINITIS  . PULMONARY NODULE, RIGHT LOWER LOBE  . GERD  . DIVERTICULOSIS OF COLON  . DEGENERATIVE JOINT DISEASE  . LOW BACK PAIN, CHRONIC  . FIBROMYALGIA  . DYSPNEA  . CONSTIPATION  . ABDOMINAL PAIN-LLQ  . PERSONAL HX COLONIC POLYPS  . Ischemic bowel disease  . CAD (coronary artery disease)  . Stroke  . Ejection fraction  . Atherosclerosis of aorta  . Carotid artery disease  . Palpitations  . Subclavian artery disease  . PFO (patent foramen ovale)    ROS   Patient denies fever, chills, headache, sweats, rash, change in vision, change in hearing, chest pain, cough, nausea vomiting, urinary symptoms. All other systems are reviewed  and are negative.  PHYSICAL EXAM  Patient is stable. She is oriented to person time and place. Affect  is normal. There is a soft left bruit in the left neck. There is no jugular venous distention. Lungs are clear. Respiratory effort is nonlabored. Cardiac exam reveals S1 and S2. There no clicks or significant murmurs. The abdomen is soft. There is no peripheral edema.  Filed Vitals:   04/03/12 1527  BP: 116/69  Pulse: 59  Weight: 181 lb (82.101 kg)   EKG is done today and reviewed by me.There is mild sinus bradycardia. EKG is normal. This  ASSESSMENT & PLAN

## 2012-04-04 ENCOUNTER — Other Ambulatory Visit: Payer: Self-pay | Admitting: Cardiology

## 2012-04-04 ENCOUNTER — Encounter (INDEPENDENT_AMBULATORY_CARE_PROVIDER_SITE_OTHER): Payer: Medicare Other

## 2012-04-04 DIAGNOSIS — I6529 Occlusion and stenosis of unspecified carotid artery: Secondary | ICD-10-CM

## 2012-04-06 ENCOUNTER — Telehealth: Payer: Self-pay | Admitting: *Deleted

## 2012-04-06 ENCOUNTER — Encounter: Payer: Self-pay | Admitting: Gastroenterology

## 2012-04-06 ENCOUNTER — Ambulatory Visit (INDEPENDENT_AMBULATORY_CARE_PROVIDER_SITE_OTHER): Payer: Medicare Other | Admitting: Gastroenterology

## 2012-04-06 VITALS — BP 110/62 | HR 68 | Ht 66.75 in | Wt 182.5 lb

## 2012-04-06 DIAGNOSIS — Z1211 Encounter for screening for malignant neoplasm of colon: Secondary | ICD-10-CM

## 2012-04-06 DIAGNOSIS — D126 Benign neoplasm of colon, unspecified: Secondary | ICD-10-CM

## 2012-04-06 DIAGNOSIS — K5732 Diverticulitis of large intestine without perforation or abscess without bleeding: Secondary | ICD-10-CM

## 2012-04-06 DIAGNOSIS — R1032 Left lower quadrant pain: Secondary | ICD-10-CM

## 2012-04-06 DIAGNOSIS — K5901 Slow transit constipation: Secondary | ICD-10-CM

## 2012-04-06 DIAGNOSIS — K219 Gastro-esophageal reflux disease without esophagitis: Secondary | ICD-10-CM

## 2012-04-06 MED ORDER — CIPROFLOXACIN HCL 500 MG PO TABS: 500.0000 mg | ORAL_TABLET | Freq: Two times a day (BID) | ORAL | Status: DC

## 2012-04-06 MED ORDER — METRONIDAZOLE 500 MG PO TABS: 500.0000 mg | ORAL_TABLET | Freq: Two times a day (BID) | ORAL | Status: DC

## 2012-04-06 MED ORDER — HYOSCYAMINE SULFATE 0.125 MG SL SUBL: 0.1250 mg | SUBLINGUAL_TABLET | SUBLINGUAL | Status: DC | PRN

## 2012-04-06 MED ORDER — MOVIPREP 100 G PO SOLR: 1.0000 | Freq: Once | ORAL | Status: DC

## 2012-04-06 MED ORDER — POLYETHYLENE GLYCOL 3350 17 GM/SCOOP PO POWD: 17.0000 g | Freq: Every day | ORAL | Status: DC

## 2012-04-06 NOTE — Progress Notes (Signed)
This is a 71 year old Caucasian female with coronary artery disease and previous stents, peripheral vascular disease, and previous CVA on aspirin and Plavix. She has recurrent diverticulitis with chronic left lower quadrant pain, and has had a recent recurrence without fever or chills. We did do vascular mesenteric angiography last year to exclude chronic mesenteric ischemia. This MR high study was negative. She continues with rather severe constipation, gas and bloating but denies rectal bleeding or melena. She has chronic GERD managed with Protonix 40 mg a day. The patient is followed closely by Dr. Willa Rough and cardiology. Review of her record shows extensive diverticulosis at the time of her colonoscopies, she also had an adenomatous polyp removed 5 years ago. She is due for followup exam at this time. The patient has had no anorexia, weight loss, food intolerances, other upper GI or hepatobiliary symptoms.  Current Medications, Allergies, Past Medical History, Past Surgical History, Family History and Social History were reviewed in [employer] record.  Pertinent Review of Systems Negative.. no cardiology reviewed, the patient appears stable from a cardiovascular standpoint. In the past she has stopped her Plavix for colonoscopy with cardiology approval. The patient denies symptoms of congestive heart failure, angina, or other cardiopulmonary complaints at this time.   Physical Exam: Healthy-appearing patient in no acute distress. Blood pressure 110/62, pulse 60 and regular, and weight 182 with BMI 28.80. I cannot appreciate stigmata of chronic liver disease. Her chest is clear and she appears to be anorectic her rhythm without murmurs gallops or rubs. Her abdomen shows no organomegaly, masses, but there is tenderness to palpation of the sigmoid colon without rebound tenderness. Bowel sounds are normal. Mental status is normal.    Assessment and Plan: Chronic, chronic  constipation and a 71 year old patient with coronary artery disease and peripheral vascular disease. She appears to have some subacute diverticulitis this time, and I will treat her with one week of Cipro 500 mg twice a day and metronidazole 500 mg twice a day with when necessary sublingual Levsin. We will schedule colonoscopy and 46 weeks' time. I've also advised her to stay on lightly 8 ounce dose of MiraLax and to adjust this dosage as per her bowel response. We will hold her Plavix 5 days before her colonoscopy unless otherwise advised by cardiology. She is to continue her salicylate therapy throughout her prep and procedure. I've also reviewed a reflux regime with this patient and she is to continue Protonix 40 mg a day for long-term acid reflux control  Please copy her primary care physician, referring physician, and pertinent subspecialists. No diagnosis found.

## 2012-04-06 NOTE — Patient Instructions (Addendum)
You have been scheduled for a colonoscopy with propofol. Please follow written instructions given to you at your visit today.  Please pick up your prep kit at the pharmacy within the next 1-3 days. If you use inhalers (even only as needed) or a CPAP machine, please bring them with you on the day of your procedure.  You will be contacted by our office prior to your procedure for directions on holding your Plavix.  If you do not hear from our office 1 week prior to your scheduled procedure, please call [phone] to discuss.   We have sent the following medications to your pharmacy for you to pick up at your convenience: Cipro 500 mg twice daily x 7 days Flagyl 500 mg twice daily x 7 days Levsin SL every 4 hours as needed Miralax dissolved in at least 8 ounces water/juice once every night.  Please call our office back in 2 weeks with an update. You may ask to speak to [place]. Phone number is [phone].  CC: Dr Donzetta Sprung

## 2012-04-06 NOTE — Telephone Encounter (Signed)
04/06/2012  RE: Stephanie Alvarado DOB: 04-13-1941 MRN: [id number]  Dear Dr Myrtis Ser,   We have scheduled the above patient for acolonoscopyds show that she is on anticoagulation therapy.  Please advise as to how long the patient may come off her therapy of Plavix prior to the procedure, which is scheduled for 05/07/12. Please oute the completed form to Vernia Buff, CMA.   Sincerely,  Vernia Buff

## 2012-04-10 NOTE — Telephone Encounter (Signed)
The patient can be off Plavix for 5 days before the colonoscopy. The Plavix needs to be restarted soon after the colonoscopy

## 2012-04-10 NOTE — Telephone Encounter (Signed)
Patient advised that per Dr Myrtis Ser, she may hold Plavix for 5 days prior to her colonoscopy and should restart after colonoscopy. Patient verbalizes understanding.

## 2012-05-07 ENCOUNTER — Ambulatory Visit (AMBULATORY_SURGERY_CENTER): Payer: Medicare Other | Admitting: Gastroenterology

## 2012-05-07 ENCOUNTER — Encounter: Payer: Self-pay | Admitting: Gastroenterology

## 2012-05-07 VITALS — BP 134/65 | HR 50 | Temp 97.5°F | Resp 15 | Ht 67.0 in | Wt 182.0 lb

## 2012-05-07 DIAGNOSIS — D126 Benign neoplasm of colon, unspecified: Secondary | ICD-10-CM

## 2012-05-07 DIAGNOSIS — Z1211 Encounter for screening for malignant neoplasm of colon: Secondary | ICD-10-CM

## 2012-05-07 DIAGNOSIS — K5901 Slow transit constipation: Secondary | ICD-10-CM

## 2012-05-07 DIAGNOSIS — R1032 Left lower quadrant pain: Secondary | ICD-10-CM

## 2012-05-07 DIAGNOSIS — Z86010 Personal history of colonic polyps: Secondary | ICD-10-CM

## 2012-05-07 DIAGNOSIS — K5730 Diverticulosis of large intestine without perforation or abscess without bleeding: Secondary | ICD-10-CM

## 2012-05-07 MED ORDER — SODIUM CHLORIDE 0.9 % IV SOLN: 500.0000 mL | INTRAVENOUS | Status: DC

## 2012-05-07 NOTE — Progress Notes (Signed)
Patient did not experience any of the following events: a burn prior to discharge; a fall within the facility; wrong site/side/patient/procedure/implant event; or a hospital transfer or hospital admission upon discharge from the facility. (G8907) Patient did not have preoperative order for IV antibiotic SSI prophylaxis. (G8918)  

## 2012-05-07 NOTE — Op Note (Addendum)
[facility] [address] [place], [address]   COLONOSCOPY PROCEDURE REPORT  PATIENT: Stephanie Alvarado, Stephanie Alvarado  MR#: [id number] BIRTHDATE: 02/18/1941 , 71  yrs. old GENDER: Female ENDOSCOPIST: [name], MD, [facility] REFERRED BY: PROCEDURE DATE:  05/07/2012 PROCEDURE:   Colonoscopy, screening ASA CLASS:   Class III INDICATIONS:Patient's personal history of adenomatous colon polyps.  MEDICATIONS: Propofol (Diprivan) 240 mg IV  DESCRIPTION OF PROCEDURE:   After the risks and benefits and of the procedure were explained, informed consent was obtained.  A digital rectal exam revealed no abnormalities of the rectum.    The LB CF-[id number]  endoscope was introduced through the anus and advanced to the cecum, which was identified by both the appendix and ileocecal valve .  The quality of the prep was adequate, using MoviPrep .  The instrument was then slowly withdrawn as the colon was fully examined.     COLON FINDINGS: Two smooth flat polyps were found in the sigmoid colon.  A polypectomy was performed using snare cautery.  The resection was complete and the polyp tissue was completely retrieved.   There was severe diverticulosis noted in the descending colon and sigmoid colon with associated muscular hypertrophy.   The colon was otherwise normal.  There was no diverticulosis, inflammation, polyps or cancers unless previously stated.     Retroflexed views revealed no abnormalities.     The scope was then withdrawn from the patient and the procedure completed.  COMPLICATIONS: There were no complications. ENDOSCOPIC IMPRESSION: 1.   Two flat polyps were found in the sigmoid colon; polypectomy was performed using snare cautery 2.   There was severe diverticulosis noted in the descending colon and sigmoid colon 3.   The colon was otherwise normal  RECOMMENDATIONS: 1.  Repeat colonoscopy in 5 years if polyp adenomatous; otherwise 10 years 2.  High fiber  diet 3  .resume plavix tomorrow  REPEAT EXAM:  XB:JYNWG Reuel Boom, MD  _______________________________ eSigned:  [name], MD, [facility] 05/07/2012 2:31 PM Revised: 05/07/2012 2:31 PM    PATIENT NAME:  [name], [name] MR#: [id number]

## 2012-05-07 NOTE — Progress Notes (Signed)
1407 A/OX3 PLEASED WITH MAC REPORT TO PENNY RN

## 2012-05-07 NOTE — Patient Instructions (Addendum)
YOU HAD AN ENDOSCOPIC PROCEDURE TODAY AT THE [facility]: Refer to the procedure report that was given to you for any specific questions about what was found during the examination.  If the procedure report does not answer your questions, please call your gastroenterologist to clarify.  If you requested that your care partner not be given the details of your procedure findings, then the procedure report has been included in a sealed envelope for you to review at your convenience later.  YOU SHOULD EXPECT: Some feelings of bloating in the abdomen. Passage of more gas than usual.  Walking can help get rid of the air that was put into your GI tract during the procedure and reduce the bloating. If you had a lower endoscopy (such as a colonoscopy or flexible sigmoidoscopy) you may notice spotting of blood in your stool or on the toilet paper. If you underwent a bowel prep for your procedure, then you may not have a normal bowel movement for a few days.  DIET: Your first meal following the procedure should be a light meal and then it is ok to progress to your normal diet.  A half-sandwich or bowl of soup is an example of a good first meal.  Heavy or fried foods are harder to digest and may make you feel nauseous or bloated.  Likewise meals heavy in dairy and vegetables can cause extra gas to form and this can also increase the bloating.  Drink plenty of fluids but you should avoid alcoholic beverages for 24 hours.  ACTIVITY: Your care partner should take you home directly after the procedure.  You should plan to take it easy, moving slowly for the rest of the day.  You can resume normal activity the day after the procedure however you should NOT DRIVE or use heavy machinery for 24 hours (because of the sedation medicines used during the test).    SYMPTOMS TO REPORT IMMEDIATELY: A gastroenterologist can be reached at any hour.  During normal business hours, 8:30 AM to 5:00 PM Monday through Friday,  call [phone].  After hours and on weekends, please call the GI answering service at [phone] who will take a message and have the physician on call contact you.   Following lower endoscopy (colonoscopy or flexible sigmoidoscopy):  Excessive amounts of blood in the stool  Significant tenderness or worsening of abdominal pains  Swelling of the abdomen that is new, acute  Fever of 100F or higher    FOLLOW UP: If any biopsies were taken you will be contacted by phone or by letter within the next 1-3 weeks.  Call your gastroenterologist if you have not heard about the biopsies in 3 weeks.  Our staff will call the home number listed on your records the next business day following your procedure to check on you and address any questions or concerns that you may have at that time regarding the information given to you following your procedure. This is a courtesy call and so if there is no answer at the home number and we have not heard from you through the emergency physician on call, we will assume that you have returned to your regular daily activities without incident.  SIGNATURES/CONFIDENTIALITY: You and/or your care partner have signed paperwork which will be entered into your electronic medical record.  These signatures attest to the fact that that the information above on your After Visit Summary has been reviewed and is understood.  Full responsibility of the confidentiality   of this discharge information lies with you and/or your care-partner.     Resume Plavix tomorrow  benefiber or metamucil daily as directed  Miralax as needed per Dr. Jarold Motto  INFORMATION ON POLYPS,DIVERTICULOSIS,&HOGH FIBER DIET GIVEN TO YOU TODAY

## 2012-05-08 ENCOUNTER — Encounter: Payer: Self-pay | Admitting: Cardiology

## 2012-05-08 ENCOUNTER — Telehealth: Payer: Self-pay

## 2012-05-08 NOTE — Progress Notes (Signed)
   When I saw the patient in October, 2013 a followup carotid Doppler was obtained. This showed that she had mild bilateral carotid disease. The anastomosis to LCCA was patent. It was noted that she had a thyroid nodule. Today I called Dr. Donzetta Sprung to be sure that he had this information. It turns out that in the meantime Dr. Andrey Campanile of ENT was aware. The area was biopsied and it is benign. Plan is for followup ultrasound in 12-18 months.  Jerral Bonito, MD

## 2012-05-08 NOTE — Telephone Encounter (Signed)
  Follow up Call-  Call back number 05/07/2012  Post procedure Call Back phone  # [phone]  Permission to leave phone message Yes     Patient questions:  Do you have a fever, pain , or abdominal swelling? no Pain Score  0 *  Have you tolerated food without any problems? yes  Have you been able to return to your normal activities? yes  Do you have any questions about your discharge instructions: Diet   no Medications  no Follow up visit  no  Do you have questions or concerns about your Care? no  Actions: * If pain score is 4 or above: No action needed, pain <4.

## 2012-05-14 ENCOUNTER — Encounter: Payer: Self-pay | Admitting: Gastroenterology

## 2012-09-24 ENCOUNTER — Other Ambulatory Visit: Payer: Self-pay | Admitting: Dermatology

## 2013-04-05 ENCOUNTER — Other Ambulatory Visit (INDEPENDENT_AMBULATORY_CARE_PROVIDER_SITE_OTHER): Payer: Medicare Other

## 2013-04-05 ENCOUNTER — Ambulatory Visit (INDEPENDENT_AMBULATORY_CARE_PROVIDER_SITE_OTHER)
Admission: RE | Admit: 2013-04-05 | Discharge: 2013-04-05 | Disposition: A | Discharge status: Home / Self Care | Payer: Medicare Other | Source: Ambulatory Visit | Attending: Gastroenterology | Admitting: Gastroenterology

## 2013-04-05 ENCOUNTER — Encounter: Payer: Self-pay | Admitting: Gastroenterology

## 2013-04-05 ENCOUNTER — Telehealth: Payer: Self-pay | Admitting: Gastroenterology

## 2013-04-05 ENCOUNTER — Ambulatory Visit (INDEPENDENT_AMBULATORY_CARE_PROVIDER_SITE_OTHER): Payer: Medicare Other | Admitting: Gastroenterology

## 2013-04-05 VITALS — BP 130/78 | HR 66 | Ht 68.0 in | Wt 180.6 lb

## 2013-04-05 DIAGNOSIS — K5732 Diverticulitis of large intestine without perforation or abscess without bleeding: Secondary | ICD-10-CM

## 2013-04-05 DIAGNOSIS — K5792 Diverticulitis of intestine, part unspecified, without perforation or abscess without bleeding: Secondary | ICD-10-CM

## 2013-04-05 DIAGNOSIS — R1032 Left lower quadrant pain: Secondary | ICD-10-CM

## 2013-04-05 LAB — CBC
Platelets: 193 10*3/uL (ref 150.0–400.0)
RBC: 4.28 Mil/uL (ref 3.87–5.11)
WBC: 6.8 10*3/uL (ref 4.5–10.5)

## 2013-04-05 LAB — COMPREHENSIVE METABOLIC PANEL
Albumin: 4 g/dL (ref 3.5–5.2)
BUN: 16 mg/dL (ref 6–23)
Calcium: 9.9 mg/dL (ref 8.4–10.5)
Chloride: 103 mEq/L (ref 96–112)
Glucose, Bld: 107 mg/dL — ABNORMAL HIGH (ref 70–99)
Potassium: 5.2 mEq/L — ABNORMAL HIGH (ref 3.5–5.1)
Total Protein: 7.4 g/dL (ref 6.0–8.3)

## 2013-04-05 MED ORDER — CIPROFLOXACIN HCL 500 MG PO TABS: 500.0000 mg | ORAL_TABLET | Freq: Two times a day (BID) | ORAL | Status: DC

## 2013-04-05 MED ORDER — IOHEXOL 300 MG/ML  SOLN
100.0000 mL | Freq: Once | INTRAMUSCULAR | Status: AC | PRN
  Administered 2013-04-05: 100 mL via INTRAVENOUS

## 2013-04-05 MED ORDER — METRONIDAZOLE 500 MG PO TABS: 500.0000 mg | ORAL_TABLET | Freq: Two times a day (BID) | ORAL | Status: DC

## 2013-04-05 NOTE — Telephone Encounter (Signed)
Pt called back before I could call her. She has a HX of Diverticulitis, constipation and GERD. Last flare of Diverticulitis was 04/09/12 and she was placed on Cipro and Flagyl x 1 week. Today pt report she is doubled over with abdominal pain; she's been hurting for 2-3 week. She was given an appt with Doug Sou, PA this am

## 2013-04-05 NOTE — Patient Instructions (Signed)
You have been scheduled for a CT scan of the abdomen and pelvis at [facility] ([address]---this is in the same building as [facility]).   You are scheduled on 1:00 at 12:45. You should arrive 15 minutes prior to your appointment time for registration. Please follow the written instructions below on the day of your exam:  WARNING: IF YOU ARE ALLERGIC TO IODINE/X-RAY DYE, PLEASE NOTIFY RADIOLOGY IMMEDIATELY AT [phone]! YOU WILL BE GIVEN A 13 HOUR PREMEDICATION PREP.  1) Do not eat or drink anything until after you CT 2) You have been given 2 bottles of oral contrast to drink. The solution may taste  better if refrigerated, but do NOT add ice or any other liquid to this solution. Shake   well before drinking.    Drink 1 bottle of contrast @ NOW (2 hours prior to your exam)  Drink 1 bottle of contrast @ 12:00 (1 hour prior to your exam)  You may take any medications as prescribed with a small amount of water except for the following: Metformin, Glucophage, Glucovance, Avandamet, Riomet, Fortamet, Actoplus Met, Janumet, Glumetza or Metaglip. The above medications must be held the day of the exam AND 48 hours after the exam.  The purpose of you drinking the oral contrast is to aid in the visualization of your intestinal tract. The contrast solution may cause some diarrhea. Before your exam is started, you will be given a small amount of fluid to drink. Depending on your individual set of symptoms, you may also receive an intravenous injection of x-ray contrast/dye. Plan on being at [facility] for 30 minutes or long, depending on the type of exam you are having performed.  If you have any questions regarding your exam or if you need to reschedule, you may call the CT department at [phone] between the hours of 8:00 am and 5:00 pm, Monday-Friday. Your physician has requested that you go to the basement for the following lab work before leaving today:CBC.  CMP  We have sent the following medications to your pharmacy for you to pick up at your convenience: Flagyl, Cipro     ________________________________________________________________________

## 2013-04-05 NOTE — Progress Notes (Signed)
04/05/2013 Stephanie Alvarado [id number] 10-16-1940   History of Present Illness:  Patient is a pleasant 72 year old female who is known to Dr. Jarold Motto. She presents to our office today with complaints of left lower quadrant abdominal pain that initially began a couple of weeks ago but has been getting progressively worse especially this week. She is very uncomfortable today. Denies any nausea, vomiting, fevers, or chills. She is passing gas and had a small bowel movement yesterday, but she does have constipation on a regular basis. She does have some dicyclomine at home, which she took, but had no relief. She does have significant diverticulosis in the descending and sigmoid colon as seen on previous imaging studies such as CAT scan and colonoscopy. Her last colonoscopy was in November of 2013 at which time she was found to have 2 polyps in the sigmoid colon, which were tubular adenomas on pathology.  Once again, she had severe diverticulosis in the descending and sigmoid colon.   Current Medications, Allergies, Past Medical History, Past Surgical History, Family History and Social History were reviewed in [employer] record.   Physical Exam: BP 130/78  Pulse 66  Ht 5\' 8"  (1.727 m)  Wt 180 lb 9.6 oz (81.92 kg)  BMI 27.47 kg/m2 General: Well developed, white female in no acute distress, but appears uncomfortable Head: Normocephalic and atraumatic Eyes:  Sclerae anicteric, conjunctiva pink  Ears: Normal auditory acuity Lungs: Clear throughout to auscultation Heart: Regular rate and rhythm Abdomen: Soft, non-distended. No masses, no hepatomegaly. Normal bowel sounds.  Moderate LLQ TTP without R/R/G. Musculoskeletal: Symmetrical with no gross deformities  Extremities: No edema  Neurological: Alert oriented x 4, grossly nonfocal Psychological:  Alert and cooperative. Normal mood and affect  Assessment and Recommendations: -Left lower quadrant abdominal pain:  Suspect and  rule out diverticulitis. Patient will have a CT scan abdomen and pelvis with contrast performed today. We will check a CBC and a BMP. We will send over prescriptions for Cipro 500 mg twice a day for 14 days and Flagyl 500 mg twice a day for 14 days proactively.  *I offered to treat her empirically for diverticulitis since I have high suspicion, however, she asked if a CT scan could be performed today if we could proceed with that for a definitive diagnosis.

## 2013-04-16 ENCOUNTER — Telehealth: Payer: Self-pay | Admitting: Gastroenterology

## 2013-04-16 MED ORDER — FLUCONAZOLE 150 MG PO TABS: ORAL_TABLET | ORAL | Status: DC

## 2013-04-16 NOTE — Telephone Encounter (Signed)
Spoke with patient and told her Doug Sou, [place]  okay'ed Diflucan pill. Rx sent to pharmacy.

## 2013-04-25 ENCOUNTER — Telehealth: Payer: Self-pay | Admitting: Gastroenterology

## 2013-04-25 NOTE — Telephone Encounter (Signed)
Stephanie Alvarado,  These blisters that she has in her vagina do not sound like a yeast infection. She needs to return tomorrow and see her PCP or GYN regarding that issue. Her CT scan was reassuring that there is nothing serious going on. She can be seen here tomorrow, possibly by Amy, but like I said she does need to see her PCP or GYN for evaluation as well. DId she feel ANY better after drinking the magnesium citrate? If so this could possibly be due somewhat to constipation.  She could repeat a bottle of magnesium citrate tonight, but otherwise I have no further recommendations until she is seen again.

## 2013-04-25 NOTE — Telephone Encounter (Signed)
Pt saw Doug Sou, PA on 04/05/13 for possible diverticulitis flare. Per the visit: Assessment and Recommendations:  -Left lower quadrant abdominal pain: Suspect and rule out diverticulitis. Patient will have a CT scan abdomen and pelvis with contrast performed today. We will check a CBC and a BMP. We will send over prescriptions for Cipro 500 mg twice a day for 14 days and Flagyl 500 mg twice a day for 14 days proactively.  *I offered to treat her empirically for diverticulitis since I have high suspicion, however, she asked if a CT scan could be performed today if we could proceed with that for a definitive diagnosis    CT scan was clear except for a heavy stool burden. She was to take a bottle of Mag Citrate to clean herself out and if she still had pain, she could start Cipro and Flagyl which she did. She called on 11//4/14 reporting a terrible yeast infection and Diflucan was ordered for 1 pill and if not clear, maybe reordered in 1 week.  Today pt reports she still has the yeast infection and she is so irritated, her vagina has blisters. Her pain is back and [place] She stopped the cipro and flagyl when the yeast began. She is willing to come here tomorrow to be seen. Jess, please advise. Blisters on her vagina, our opinion is that's not yeast. Should we restart the Cipro/Flagyl? You are not here tomorrow. Thanks.

## 2013-04-25 NOTE — Telephone Encounter (Signed)
Informed pt of Jessica's instructions. She again does think yeast is the problem with her vagina. I called and had the Diflucan refilled. Instructed pt to make an appt with her PCP or GYN, but she voices doubt. She did feel better with mag citrate but she did not feel as though she got cleaned out, but she will drink another bottle tonight. She will call for an appt tomorrow or next week.

## 2013-05-14 ENCOUNTER — Telehealth: Payer: Self-pay | Admitting: Gastroenterology

## 2013-05-14 DIAGNOSIS — R1032 Left lower quadrant pain: Secondary | ICD-10-CM

## 2013-05-14 NOTE — Telephone Encounter (Signed)
CT scan did not show a blockage.  Is she moving her bowels now?  If so, then once again, I doubt that she has a blockage.  We could get a 2 view abdominal xray to document if there is still a lot of stool in the colon.  If the pain develops in her back then goes to her abdomen it may be back pain the is being referred to her abdomen, especially since it hurts when she moves.  Let me know what her response is.  Thank you,  Jess

## 2013-05-14 NOTE — Telephone Encounter (Signed)
Pt seen by Doug Sou, PA on 04/05/13 for abdominal pain. A CT was done there was no diverticulitis, but heavy stool burden was shown. Shanda Bumps had given her Cipro and Flagyl just in case x 14 days. She took about 7 days worth of the AB and developed a yeast infection so she stopped the AB. The pain continues whenever she bends over; pain develops in the middle of her back and then moves around to her left side. She saw a PA at her PCPs ofc who said she might have a blockage. Please advise. Thanks.

## 2013-05-15 NOTE — Telephone Encounter (Signed)
Please have her come in for a 2 view abdominal x-ray today if she can to see how much stool is in the colon before further treatment recommendations.  Thank you,  Jess

## 2013-05-15 NOTE — Telephone Encounter (Signed)
Pt states she is constipated and believes she has an impaction because last night she had just water as a BM  several times with tremendous pain. She states when she saw the PA in [place], she stated she had a lot of WBCs, but no UTI. She states the pain starts in her lower butt and moves around to her Left side. Please advise. Last COLON 05/07/12. Thanks

## 2013-05-15 NOTE — Telephone Encounter (Signed)
lmom for pt to call back

## 2013-05-20 ENCOUNTER — Emergency Department (HOSPITAL_COMMUNITY)
Admission: EM | Admit: 2013-05-20 | Discharge: 2013-05-20 | Disposition: A | Discharge status: Home / Self Care | Payer: Medicare Other | Attending: Emergency Medicine | Admitting: Emergency Medicine

## 2013-05-20 ENCOUNTER — Telehealth: Payer: Self-pay | Admitting: Gastroenterology

## 2013-05-20 ENCOUNTER — Encounter (HOSPITAL_COMMUNITY): Payer: Self-pay | Admitting: Emergency Medicine

## 2013-05-20 ENCOUNTER — Ambulatory Visit (HOSPITAL_COMMUNITY)
Admission: RE | Admit: 2013-05-20 | Discharge: 2013-05-20 | Disposition: A | Discharge status: Home / Self Care | Payer: Medicare Other | Source: Ambulatory Visit | Attending: Gastroenterology | Admitting: Gastroenterology

## 2013-05-20 DIAGNOSIS — Z9089 Acquired absence of other organs: Secondary | ICD-10-CM | POA: Insufficient documentation

## 2013-05-20 DIAGNOSIS — Z8673 Personal history of transient ischemic attack (TIA), and cerebral infarction without residual deficits: Secondary | ICD-10-CM | POA: Insufficient documentation

## 2013-05-20 DIAGNOSIS — Z8669 Personal history of other diseases of the nervous system and sense organs: Secondary | ICD-10-CM | POA: Insufficient documentation

## 2013-05-20 DIAGNOSIS — R109 Unspecified abdominal pain: Secondary | ICD-10-CM

## 2013-05-20 DIAGNOSIS — Z862 Personal history of diseases of the blood and blood-forming organs and certain disorders involving the immune mechanism: Secondary | ICD-10-CM | POA: Insufficient documentation

## 2013-05-20 DIAGNOSIS — K5900 Constipation, unspecified: Secondary | ICD-10-CM | POA: Insufficient documentation

## 2013-05-20 DIAGNOSIS — E785 Hyperlipidemia, unspecified: Secondary | ICD-10-CM | POA: Insufficient documentation

## 2013-05-20 DIAGNOSIS — Z860100 Personal history of colon polyps, unspecified: Secondary | ICD-10-CM | POA: Insufficient documentation

## 2013-05-20 DIAGNOSIS — Z9861 Coronary angioplasty status: Secondary | ICD-10-CM | POA: Insufficient documentation

## 2013-05-20 DIAGNOSIS — Z7902 Long term (current) use of antithrombotics/antiplatelets: Secondary | ICD-10-CM | POA: Insufficient documentation

## 2013-05-20 DIAGNOSIS — K219 Gastro-esophageal reflux disease without esophagitis: Secondary | ICD-10-CM | POA: Insufficient documentation

## 2013-05-20 DIAGNOSIS — Z791 Long term (current) use of non-steroidal anti-inflammatories (NSAID): Secondary | ICD-10-CM | POA: Insufficient documentation

## 2013-05-20 DIAGNOSIS — M549 Dorsalgia, unspecified: Secondary | ICD-10-CM

## 2013-05-20 DIAGNOSIS — R112 Nausea with vomiting, unspecified: Secondary | ICD-10-CM | POA: Insufficient documentation

## 2013-05-20 DIAGNOSIS — I10 Essential (primary) hypertension: Secondary | ICD-10-CM | POA: Insufficient documentation

## 2013-05-20 DIAGNOSIS — F411 Generalized anxiety disorder: Secondary | ICD-10-CM | POA: Insufficient documentation

## 2013-05-20 DIAGNOSIS — M1990 Unspecified osteoarthritis, unspecified site: Secondary | ICD-10-CM | POA: Insufficient documentation

## 2013-05-20 DIAGNOSIS — E669 Obesity, unspecified: Secondary | ICD-10-CM | POA: Insufficient documentation

## 2013-05-20 DIAGNOSIS — Z79899 Other long term (current) drug therapy: Secondary | ICD-10-CM | POA: Insufficient documentation

## 2013-05-20 DIAGNOSIS — I2510 Atherosclerotic heart disease of native coronary artery without angina pectoris: Secondary | ICD-10-CM | POA: Insufficient documentation

## 2013-05-20 DIAGNOSIS — I5032 Chronic diastolic (congestive) heart failure: Secondary | ICD-10-CM | POA: Insufficient documentation

## 2013-05-20 DIAGNOSIS — Z87891 Personal history of nicotine dependence: Secondary | ICD-10-CM | POA: Insufficient documentation

## 2013-05-20 DIAGNOSIS — M545 Low back pain, unspecified: Secondary | ICD-10-CM | POA: Insufficient documentation

## 2013-05-20 DIAGNOSIS — I059 Rheumatic mitral valve disease, unspecified: Secondary | ICD-10-CM | POA: Insufficient documentation

## 2013-05-20 DIAGNOSIS — Z90710 Acquired absence of both cervix and uterus: Secondary | ICD-10-CM | POA: Insufficient documentation

## 2013-05-20 DIAGNOSIS — Z8709 Personal history of other diseases of the respiratory system: Secondary | ICD-10-CM | POA: Insufficient documentation

## 2013-05-20 LAB — CBC WITH DIFFERENTIAL/PLATELET
Basophils Absolute: 0 10*3/uL (ref 0.0–0.1)
Basophils Relative: 0 % (ref 0–1)
Eosinophils Absolute: 0.1 10*3/uL (ref 0.0–0.7)
Eosinophils Relative: 1 % (ref 0–5)
HCT: 42.8 % (ref 36.0–46.0)
Hemoglobin: 14.1 g/dL (ref 12.0–15.0)
Lymphs Abs: 2 10*3/uL (ref 0.7–4.0)
MCH: 31.1 pg (ref 26.0–34.0)
MCHC: 32.9 g/dL (ref 30.0–36.0)
Monocytes Absolute: 0.9 10*3/uL (ref 0.1–1.0)
Neutro Abs: 6.1 10*3/uL (ref 1.7–7.7)
RBC: 4.54 MIL/uL (ref 3.87–5.11)
RDW: 13.9 % (ref 11.5–15.5)

## 2013-05-20 LAB — BASIC METABOLIC PANEL
Calcium: 10.4 mg/dL (ref 8.4–10.5)
Creatinine, Ser: 0.91 mg/dL (ref 0.50–1.10)
GFR calc Af Amer: 70 mL/min — ABNORMAL LOW (ref >90)
GFR calc non Af Amer: 61 mL/min — ABNORMAL LOW (ref >90)
Potassium: 4 mEq/L (ref 3.5–5.1)

## 2013-05-20 LAB — CG4 I-STAT (LACTIC ACID): Lactic Acid, Venous: 0.97 mmol/L (ref 0.5–2.2)

## 2013-05-20 MED ORDER — DOCUSATE SODIUM 100 MG PO CAPS: 100.0000 mg | ORAL_CAPSULE | Freq: Two times a day (BID) | ORAL | Status: DC

## 2013-05-20 MED ORDER — OXYCODONE-ACETAMINOPHEN 5-325 MG PO TABS: 1.0000 | ORAL_TABLET | Freq: Four times a day (QID) | ORAL | Status: DC | PRN

## 2013-05-20 NOTE — ED Notes (Signed)
No pain sitting in bed. Sat on side of bed and states pain was 9. Slight grimacing noted. Nad.

## 2013-05-20 NOTE — ED Provider Notes (Signed)
CSN: [id number]     Arrival date & time 05/20/13  1334 History  This chart was scribed for Joya Gaskins, MD by Bennett Scrape, ED Scribe. This patient was seen in room APA14/APA14 and the patient's care was started at 2:28 PM.   Chief Complaint  Patient presents with  . Tailbone Pain  . Constipation   Patient is a 72 y.o. female presenting with abdominal pain. The history is provided by the patient. No language interpreter was used.  Abdominal Pain Pain location:  LLQ Pain quality: stabbing   Pain radiates to:  Back Timing:  Constant Progression:  Worsening Chronicity:  New Worsened by:  Movement and position changes Associated symptoms: nausea and vomiting   Associated symptoms: no chills, no fever and no hematuria     HPI Comments: Stephanie Alvarado is a 72 y.o. female who presents to the Emergency Department complaining of persistent LLQ abdominal pain that radiates around to her left buttocks that is generally worse at night. She describes the pain as " a razor blade to my intestines". She states that she became concerned when the pain started radiating last night to the right lower back and intensified. She states that she was seen by her GI and was told that her symptoms could be diverticulitis. She states that she had a CT scan of the abdomen that showed a moderate amount of stool one week ago. No diverticulitis. She reports that she has been taking miralax with no improvement.  She then followed up with her PMD and had an x-ray of her back that was normal but stool was noted today. She was told that her urine appeared dirty but she denies being prescribed any antibiotics. She states that she has been having stools described as "little pieces" the size of her pinky finger.  She reports that last night she began having watery stool and several episodes of emesis. She has been trying a heating pad with no improvement. She has a h/o cholecystectomy as a young child but denies any recent  surgeries.   Past Medical History  Diagnosis Date  . Hypertension   . CAD (coronary artery disease)     DES and circumflex 2006 /  DES to LAD 2011  . Hyperlipemia   . Hiatal hernia   . Esophageal reflux   . Lumbar disc disease   . Allergic rhinitis, cause unspecified   . Sleep related hypoventilation/hypoxemia in conditions classifiable elsewhere   . Shortness of breath   . Other diseases of lung, not elsewhere classified   . Chronic diastolic heart failure   . Peripheral vascular disease, unspecified   . Unspecified venous (peripheral) insufficiency   . Obesity, unspecified   . Cyst of thyroid   . Diverticulosis of colon (without mention of hemorrhage)   . Personal history of colonic polyps     ADENOMATOUS POLYP  . Osteoarthrosis, unspecified whether generalized or localized, unspecified site   . Lumbago   . Nonspecific abnormal finding in stool contents   . Unspecified cerebral artery occlusion with cerebral infarction   . Anxiety state, unspecified   . Anemia, unspecified   . Stroke 11/2008    Treated with TPA? /     2010, TEE no left atrial clot, probably from atherosclerosis of the aortic arch  . Ejection fraction      EF 60%, Echo, November 12, 2008, /  EF 55%, TEE, November 17, 2008  . Atherosclerosis of aorta     TEE, June,  2010, grade 4 aortic arch atherosclerosis , Dr. Shirlee Latch  . Dysphasia     Possibly esophageal stricture  . Carotid artery disease     Doppler, November, 2011, stable, 0-39% bilateral, turbulent flow left subclavian  . Palpitations     October, 2012  . Subclavian artery disease     Remote surgery by Dr. Betti Cruz.  Marland Kitchen PFO (patent foramen ovale)     Patient had a very small PFO by TEE 2010.  This was seen only by bubble analysis during Valsalva  . MVP (mitral valve prolapse)   . Osteopenia    Past Surgical History  Procedure Laterality Date  . Coronary angioplasty with stent placement  1990's  . Laparoscopic hysterectomy    . Urethral suspension  1993     Dr. Jennette Kettle  . Lumbar disc surgery      X 3  . Cholecystectomy    . Varicose vein surgery      x 2  . Cataracts      both eyes   Family History  Problem Relation Age of Onset  . Heart disease Father   . Breast cancer      cousions  . Alzheimer's disease Mother   . Colon cancer Paternal Aunt   . Esophageal cancer Neg Hx   . Rectal cancer Neg Hx   . Stomach cancer Neg Hx    History  Substance Use Topics  . Smoking status: Former Smoker -- 1.00 packs/day for 40 years    Types: Cigarettes    Quit date: 06/13/1998  . Smokeless tobacco: Never Used  . Alcohol Use: No   No OB history provided.  Review of Systems  Constitutional: Negative for fever and chills.  Gastrointestinal: Positive for nausea, vomiting and abdominal pain. Negative for anal bleeding.  Genitourinary: Negative for hematuria.  All other systems reviewed and are negative.    Allergies  Codeine  Home Medications   Current Outpatient Rx  Name  Route  Sig  Dispense  Refill  . amLODipine (NORVASC) 5 MG tablet   Oral   Take 5 mg by mouth daily.           Marland Kitchen atorvastatin (LIPITOR) 40 MG tablet   Oral   Take 1 tablet by mouth Daily.         Marland Kitchen buPROPion (WELLBUTRIN XL) 150 MG 24 hr tablet      2 tabs po qd         . ciprofloxacin (CIPRO) 500 MG tablet   Oral   Take 1 tablet (500 mg total) by mouth 2 (two) times daily. Take 1 tablet twice a day for 14 days   30 tablet   0   . clopidogrel (PLAVIX) 75 MG tablet   Oral   Take 1 tablet (75 mg total) by mouth daily.   90 tablet   3   . fluconazole (DIFLUCAN) 150 MG tablet      Take one po. May repeat in one week if needed.   1 tablet   1   . furosemide (LASIX) 40 MG tablet   Oral   Take 40 mg by mouth daily.          Marland Kitchen gabapentin (NEURONTIN) 300 MG capsule   Oral   Take 1 capsule (300 mg total) by mouth 3 (three) times daily.   270 capsule   3   . hyoscyamine (LEVSIN/SL) 0.125 MG SL tablet   Sublingual   Place 1 tablet (0.125  mg  total) under the tongue every 4 (four) hours as needed for cramping.   30 tablet   1   . isosorbide mononitrate (IMDUR) 30 MG 24 hr tablet      TAKE 1 TABLET DAILY   90 tablet   3   . LORazepam (ATIVAN) 0.5 MG tablet   Oral   Take 0.5 mg by mouth as needed.           . meloxicam (MOBIC) 15 MG tablet   Oral   Take 1 tablet (15 mg total) by mouth daily.   90 tablet   3   . metoprolol (LOPRESSOR) 50 MG tablet   Oral   Take 50 mg by mouth 2 (two) times daily.          . metroNIDAZOLE (FLAGYL) 500 MG tablet   Oral   Take 1 tablet (500 mg total) by mouth 2 (two) times daily. Take 1 tablet twice a day for 14  days   30 tablet   0   . Multiple Vitamin (MULTIVITAMIN) tablet   Oral   Take 1 tablet by mouth daily. womens multivitamin plus tabs          . nitroGLYCERIN (NITROSTAT) 0.4 MG SL tablet   Sublingual   Place 1 tablet (0.4 mg total) under the tongue every 5 (five) minutes as needed.   25 tablet   3   . pantoprazole (PROTONIX) 40 MG tablet   Oral   Take 1 tablet (40 mg total) by mouth daily.   180 tablet   3   . polyethylene glycol powder (GLYCOLAX/MIRALAX) powder   Oral   Take 17 g by mouth at bedtime.   527 g   2   . zolpidem (AMBIEN) 10 MG tablet   Oral   Take 1 tablet (10 mg total) by mouth at bedtime as needed.   15 tablet   3     Further refills need to go to Dr Reuel Boom, her pcp    Triage Vitals: BP 154/64  Pulse 57  Temp(Src) 98.1 F (36.7 C) (Oral)  Resp 22  SpO2 100%  Physical Exam  Nursing note and vitals reviewed.  CONSTITUTIONAL: Well developed/well nourished HEAD: Normocephalic/atraumatic EYES: EOMI/PERRL ENMT: Mucous membranes moist NECK: supple no meningeal signs SPINE:entire spine nontender, mild lumbar paraspinal tenderness CV: S1/S2 noted, no murmurs/rubs/gallops noted LUNGS: Lungs are clear to auscultation bilaterally, no apparent distress ABDOMEN: soft, nontender, no rebound or guarding, +BS GU:no cva  tenderness RECTAL: Chaperone present, anal tone present, no stool in vault NEURO: Pt is awake/alert, moves all extremitiesx4, no focal weakness in the lower extremities  EXTREMITIES: pulses normal, full ROM SKIN: warm, color normal PSYCH: no abnormalities of mood noted  ED Course  Procedures (including critical care time)  DIAGNOSTIC STUDIES: Oxygen Saturation is 100% on room air, normal by my interpretation.    COORDINATION OF CARE: 2:37 PM-Discussed treatment plan which includes CBC panel and CMP with pt at bedside and pt agreed to plan.   2:44 PM Pt had ct lumbar performed at [facility] on 05/15/13 that did not show acute findings  4:23 PM Pt with what appears to MSK back pain without focal neuro deficits Advised need for outpatient f/u and may need outpatient MRI No indication for emergent MRI She agrees to use short course of pain meds and will add on colace for constipation She does not appear to have acute abdominal process  Labs Review Labs Reviewed - No data to display Imaging Review  Dg Abd 2 Views  05/20/2013   CLINICAL DATA:  Abdominal pain, back pain, constipation  EXAM: ABDOMEN - 2 VIEW  COMPARISON:  None  FINDINGS: Normal bowel gas pattern with stool noted in right colon.  No bowel dilatation or bowel wall thickening.  No free intraperitoneal air.  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  Prior L4-S1 fusion.  Numerous pelvic phleboliths without definite urinary tract calcification.  IMPRESSION: Normal bowel gas pattern.   Electronically Signed   By: Ulyses Southward M.D.   On: 05/20/2013 11:53    EKG Interpretation   None       MDM  No diagnosis found. Nursing notes including past medical history and social history reviewed and considered in documentation Labs/vital reviewed and considered   I personally performed the services described in this documentation, which was scribed in my presence. The recorded information has been reviewed and is accurate.        Joya Gaskins, MD 05/20/13 [phone]

## 2013-05-20 NOTE — ED Notes (Signed)
Pt reports pain in her sacral area that radiates along her waistline. Pt states she has been recently treated bowel obstruction, reports using 18oz of maloox in last 24hrs without relief. Pain worsened last night.

## 2013-05-20 NOTE — Telephone Encounter (Signed)
Stephanie Alvarado. Zehr, PA-C at 05/15/2013 2:13 PM     Status: Signed        Please have her come in for a 2 view abdominal x-ray today if she can to see how much stool is in the colon before further treatment recommendations.  Thank you,   Jess   Pt never called back after this note. This am she reports she is doubled over in pain and has to be seen. Spoke with DR Jarold Motto who ordered a CT, but I told him in the past, she's had a heavy stool burden. We will begin with 2 View of the abdomen and go from there. Pt feels she is impacted and I informed her I don't think an ER will disimpact her, she may have to do it herself. She states she has disimpacted herself before, but she feels this is worse. Advised her to have the XRAY and we will go from there; pt requests to go to [place]. Done.

## 2013-05-30 ENCOUNTER — Encounter: Payer: Self-pay | Admitting: Cardiology

## 2013-06-03 ENCOUNTER — Ambulatory Visit (INDEPENDENT_AMBULATORY_CARE_PROVIDER_SITE_OTHER): Payer: Medicare Other | Admitting: Cardiology

## 2013-06-03 ENCOUNTER — Encounter: Payer: Self-pay | Admitting: Cardiology

## 2013-06-03 VITALS — BP 155/79 | HR 54 | Ht 67.5 in | Wt 178.0 lb

## 2013-06-03 DIAGNOSIS — I2510 Atherosclerotic heart disease of native coronary artery without angina pectoris: Secondary | ICD-10-CM

## 2013-06-03 DIAGNOSIS — E7800 Pure hypercholesterolemia, unspecified: Secondary | ICD-10-CM

## 2013-06-03 DIAGNOSIS — I779 Disorder of arteries and arterioles, unspecified: Secondary | ICD-10-CM

## 2013-06-03 MED ORDER — PANTOPRAZOLE SODIUM 40 MG PO TBEC: 40.0000 mg | DELAYED_RELEASE_TABLET | Freq: Two times a day (BID) | ORAL | Status: DC

## 2013-06-03 MED ORDER — CLOPIDOGREL BISULFATE 75 MG PO TABS: 75.0000 mg | ORAL_TABLET | Freq: Every day | ORAL | Status: DC

## 2013-06-03 NOTE — Assessment & Plan Note (Signed)
We know she has mild carotid disease. We know that her left common carotid artery is patent. No further workup needed at this time.

## 2013-06-03 NOTE — Progress Notes (Signed)
HPI  Patient is seen to followup coronary artery disease and carotid disease. I took over the patient's care after Dr. Juanda Chance retired. The patient has had coronary stents in the past. She had vascular surgery that included her left common carotid in the past. She also had a neurologic event in 2010. TEE was done. It was felt she may have had some atherosclerotic disease of the aorta. She has remained stable. I saw her last October, 2013. We did a followup carotid at that time. The anastomosis of the left common carotid was patent. She had mild bilateral carotid disease. It was noted that she had a thyroid nodule. Eventually this was assessed completely including biopsy. It was felt to be benign.  She has not been having any chest pain. She does have some easy bruisability in her hands. She is on only Plavix.  Allergies  Allergen Reactions  . Codeine     REACTION: itch    Current Outpatient Prescriptions  Medication Sig Dispense Refill  . amLODipine (NORVASC) 5 MG tablet Take 5 mg by mouth daily.        Marland Kitchen atorvastatin (LIPITOR) 40 MG tablet Take 20 mg by mouth Daily.       Marland Kitchen buPROPion (WELLBUTRIN XL) 150 MG 24 hr tablet 2 tabs po qd      . clopidogrel (PLAVIX) 75 MG tablet Take 1 tablet (75 mg total) by mouth daily.  90 tablet  3  . docusate sodium (COLACE) 100 MG capsule Take 1 capsule (100 mg total) by mouth every 12 (twelve) hours.  60 capsule  0  . furosemide (LASIX) 40 MG tablet Take 40 mg by mouth every other day.       . gabapentin (NEURONTIN) 600 MG tablet Take 600 mg by mouth 3 (three) times daily.      . isosorbide mononitrate (IMDUR) 30 MG 24 hr tablet TAKE 1 TABLET DAILY  90 tablet  3  . LORazepam (ATIVAN) 0.5 MG tablet Take 0.5 mg by mouth as needed.        . meloxicam (MOBIC) 15 MG tablet Take 15 mg by mouth daily.      . metoprolol (LOPRESSOR) 50 MG tablet Take 50 mg by mouth 2 (two) times daily.       . nitroGLYCERIN (NITROSTAT) 0.4 MG SL tablet Place 1 tablet (0.4 mg  total) under the tongue every 5 (five) minutes as needed.  25 tablet  3  . oxyCODONE-acetaminophen (PERCOCET/ROXICET) 5-325 MG per tablet Take 1 tablet by mouth every 6 (six) hours as needed for severe pain.  10 tablet  0  . pantoprazole (PROTONIX) 40 MG tablet Take 1 tablet (40 mg total) by mouth daily.  180 tablet  3  . polyethylene glycol powder (GLYCOLAX/MIRALAX) powder Take 17 g by mouth daily.      Marland Kitchen zolpidem (AMBIEN) 10 MG tablet Take 1 tablet (10 mg total) by mouth at bedtime as needed.  15 tablet  3   No current facility-administered medications for this visit.    History   Social History  . Marital Status: Widowed    Spouse Name: N/A    Number of Children: 4  . Years of Education: N/A   Occupational History  . Retired    Social History Main Topics  . Smoking status: Former Smoker -- 1.00 packs/day for 40 years    Types: Cigarettes    Quit date: 06/13/1998  . Smokeless tobacco: Never Used  . Alcohol Use: No  .  Drug Use: No  . Sexual Activity: Not on file   Other Topics Concern  . Not on file   Social History Narrative  . No narrative on file    Family History  Problem Relation Age of Onset  . Heart disease Father   . Breast cancer      cousions  . Alzheimer's disease Mother   . Colon cancer Paternal Aunt   . Esophageal cancer Neg Hx   . Rectal cancer Neg Hx   . Stomach cancer Neg Hx     Past Medical History  Diagnosis Date  . Hypertension   . CAD (coronary artery disease)     DES and circumflex 2006 /  DES to LAD 2011  . Hyperlipemia   . Hiatal hernia   . Esophageal reflux   . Lumbar disc disease   . Allergic rhinitis, cause unspecified   . Sleep related hypoventilation/hypoxemia in conditions classifiable elsewhere   . Shortness of breath   . Other diseases of lung, not elsewhere classified   . Chronic diastolic heart failure   . Peripheral vascular disease, unspecified   . Unspecified venous (peripheral) insufficiency   . Obesity, unspecified    . Cyst of thyroid   . Diverticulosis of colon (without mention of hemorrhage)   . Personal history of colonic polyps     ADENOMATOUS POLYP  . Osteoarthrosis, unspecified whether generalized or localized, unspecified site   . Lumbago   . Nonspecific abnormal finding in stool contents   . Unspecified cerebral artery occlusion with cerebral infarction   . Anxiety state, unspecified   . Anemia, unspecified   . Stroke 11/2008    Treated with TPA? /     2010, TEE no left atrial clot, probably from atherosclerosis of the aortic arch  . Ejection fraction      EF 60%, Echo, November 12, 2008, /  EF 55%, TEE, November 17, 2008  . Atherosclerosis of aorta     TEE, June, 2010, grade 4 aortic arch atherosclerosis , Dr. Shirlee Latch  . Dysphasia     Possibly esophageal stricture  . Carotid artery disease     Doppler, November, 2011, stable, 0-39% bilateral, turbulent flow left subclavian  . Palpitations     October, 2012  . Subclavian artery disease     Remote surgery by Dr. Betti Cruz.  Marland Kitchen PFO (patent foramen ovale)     Patient had a very small PFO by TEE 2010.  This was seen only by bubble analysis during Valsalva  . MVP (mitral valve prolapse)   . Osteopenia     Past Surgical History  Procedure Laterality Date  . Coronary angioplasty with stent placement  1990's  . Laparoscopic hysterectomy    . Urethral suspension  1993    Dr. Jennette Kettle  . Lumbar disc surgery      X 3  . Cholecystectomy    . Varicose vein surgery      x 2  . Cataracts      both eyes    Patient Active Problem List   Diagnosis Date Noted  . Palpitations     Priority: High  . Subclavian artery disease     Priority: High  . PFO (patent foramen ovale)     Priority: High  . CAD (coronary artery disease)     Priority: High  . Ejection fraction     Priority: High  . Atherosclerosis of aorta     Priority: High  . Carotid artery disease  Priority: High  . LLQ abdominal pain 04/05/2013  . Diverticulitis 04/05/2013  . 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 CONSTIPATION 06/11/2010  . ABDOMINAL PAIN-LLQ 06/11/2010  . PERSONAL HX COLONIC POLYPS 06/11/2010  . SLEEP RELATED HYPOVENTILATION/HYPOXEMIA CCE 07/17/2009  . Stroke 11/11/2008  . COLONIC POLYPS 08/31/2008  . THYROID CYST 08/31/2008  . VENOUS INSUFFICIENCY 08/31/2008  . DIVERTICULOSIS OF COLON 08/31/2008  . LOW BACK PAIN, CHRONIC 08/31/2008  . 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 DYSPNEA 08/08/2008  . HYPERCHOLESTEROLEMIA 04/19/2007  . OBESITY 04/19/2007  . ANEMIA 04/19/2007  . ANXIETY 04/19/2007  . HYPERTENSION 04/19/2007  . ALLERGIC RHINITIS 04/19/2007  . PULMONARY NODULE, RIGHT LOWER LOBE 04/19/2007  . GERD 04/19/2007  . DEGENERATIVE JOINT DISEASE 04/19/2007    ROS   Patient denies fever, chills, headache, sweats, rash, change in vision, change in hearing, chest pain, cough, nausea or vomiting, urinary symptoms. All other systems are reviewed and are negative.  PHYSICAL EXAM  Patient is oriented to person time and place. Affect is normal. There is no jugulovenous distention. Lungs are clear. Respiratory effort is nonlabored. Cardiac exam her vitals S1 and S2. There no clicks or significant murmurs. The abdomen is soft. There is no peripheral edema.  Filed Vitals:   06/03/13 1310  BP: 155/79  Pulse: 54  Height: 5' 7.5" (1.715 m)  Weight: 178 lb (80.74 kg)   EKG is done today and reviewed by me. There is normal sinus rhythm with slight bradycardia. There is no change in the EKG.  ASSESSMENT & PLAN

## 2013-06-03 NOTE — Assessment & Plan Note (Signed)
Patient is on appropriate treatment. No change in therapy.

## 2013-06-03 NOTE — Assessment & Plan Note (Signed)
Coronary disease is stable. She had an intervention in 2011. 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 Nuclear scan revealed breast attenuation but no definite ischemia. She remains on Plavix but not aspirin. She is also had a neurologic event. No further cardiac workup at this time.

## 2013-06-03 NOTE — Patient Instructions (Signed)

## 2013-06-20 ENCOUNTER — Telehealth: Payer: Self-pay | Admitting: Cardiology

## 2013-06-20 NOTE — Telephone Encounter (Signed)
New message     Pt needs an injection---need someone to OK her to stop plavix for 7 days.  Dr Dorene Ar is going to do the injection.  They faxed a form days ago.

## 2013-06-20 NOTE — Telephone Encounter (Signed)
Okay to hold Plavix for 5 days before the procedure. Restart the Plavix as soon after the procedure is possible.

## 2013-06-20 NOTE — Telephone Encounter (Signed)
Information faxed to Dr. Isabelle Course office.

## 2013-06-20 NOTE — Telephone Encounter (Signed)
I will forward to dr/nurse for advice.

## 2013-07-12 NOTE — Telephone Encounter (Signed)
Pt had the xray and never called back. Could not get her to call back for results either.

## 2013-08-02 ENCOUNTER — Telehealth: Payer: Self-pay | Admitting: Cardiology

## 2013-08-02 NOTE — Telephone Encounter (Signed)
Received request from Nurse fax box, documents faxed for surgical clearance. To: [facility] Fax number: [phone] Attention: 2.20.15/kdm

## 2013-10-15 ENCOUNTER — Encounter (HOSPITAL_COMMUNITY): Payer: Self-pay | Admitting: Emergency Medicine

## 2013-10-15 ENCOUNTER — Emergency Department (HOSPITAL_COMMUNITY)
Admission: EM | Admit: 2013-10-15 | Discharge: 2013-10-15 | Disposition: A | Discharge status: Home / Self Care | Payer: Medicare HMO | Attending: Emergency Medicine | Admitting: Emergency Medicine

## 2013-10-15 DIAGNOSIS — I5032 Chronic diastolic (congestive) heart failure: Secondary | ICD-10-CM | POA: Insufficient documentation

## 2013-10-15 DIAGNOSIS — Z87891 Personal history of nicotine dependence: Secondary | ICD-10-CM | POA: Insufficient documentation

## 2013-10-15 DIAGNOSIS — Q211 Atrial septal defect: Secondary | ICD-10-CM | POA: Insufficient documentation

## 2013-10-15 DIAGNOSIS — I10 Essential (primary) hypertension: Secondary | ICD-10-CM | POA: Insufficient documentation

## 2013-10-15 DIAGNOSIS — Z9861 Coronary angioplasty status: Secondary | ICD-10-CM | POA: Insufficient documentation

## 2013-10-15 DIAGNOSIS — M1990 Unspecified osteoarthritis, unspecified site: Secondary | ICD-10-CM | POA: Insufficient documentation

## 2013-10-15 DIAGNOSIS — Z79899 Other long term (current) drug therapy: Secondary | ICD-10-CM | POA: Insufficient documentation

## 2013-10-15 DIAGNOSIS — I2510 Atherosclerotic heart disease of native coronary artery without angina pectoris: Secondary | ICD-10-CM | POA: Insufficient documentation

## 2013-10-15 DIAGNOSIS — Q2111 Secundum atrial septal defect: Secondary | ICD-10-CM | POA: Insufficient documentation

## 2013-10-15 DIAGNOSIS — Z9889 Other specified postprocedural states: Secondary | ICD-10-CM | POA: Insufficient documentation

## 2013-10-15 DIAGNOSIS — IMO0002 Reserved for concepts with insufficient information to code with codable children: Secondary | ICD-10-CM | POA: Insufficient documentation

## 2013-10-15 DIAGNOSIS — M5416 Radiculopathy, lumbar region: Secondary | ICD-10-CM

## 2013-10-15 DIAGNOSIS — K219 Gastro-esophageal reflux disease without esophagitis: Secondary | ICD-10-CM | POA: Insufficient documentation

## 2013-10-15 DIAGNOSIS — Z8673 Personal history of transient ischemic attack (TIA), and cerebral infarction without residual deficits: Secondary | ICD-10-CM | POA: Insufficient documentation

## 2013-10-15 DIAGNOSIS — I059 Rheumatic mitral valve disease, unspecified: Secondary | ICD-10-CM | POA: Insufficient documentation

## 2013-10-15 DIAGNOSIS — Z8709 Personal history of other diseases of the respiratory system: Secondary | ICD-10-CM | POA: Insufficient documentation

## 2013-10-15 DIAGNOSIS — Z860100 Personal history of colon polyps, unspecified: Secondary | ICD-10-CM | POA: Insufficient documentation

## 2013-10-15 DIAGNOSIS — Z7902 Long term (current) use of antithrombotics/antiplatelets: Secondary | ICD-10-CM | POA: Insufficient documentation

## 2013-10-15 DIAGNOSIS — Z8669 Personal history of other diseases of the nervous system and sense organs: Secondary | ICD-10-CM | POA: Insufficient documentation

## 2013-10-15 DIAGNOSIS — E785 Hyperlipidemia, unspecified: Secondary | ICD-10-CM | POA: Insufficient documentation

## 2013-10-15 DIAGNOSIS — E669 Obesity, unspecified: Secondary | ICD-10-CM | POA: Insufficient documentation

## 2013-10-15 DIAGNOSIS — F411 Generalized anxiety disorder: Secondary | ICD-10-CM | POA: Insufficient documentation

## 2013-10-15 DIAGNOSIS — Z862 Personal history of diseases of the blood and blood-forming organs and certain disorders involving the immune mechanism: Secondary | ICD-10-CM | POA: Insufficient documentation

## 2013-10-15 DIAGNOSIS — Z791 Long term (current) use of non-steroidal anti-inflammatories (NSAID): Secondary | ICD-10-CM | POA: Insufficient documentation

## 2013-10-15 MED ORDER — HYDROCODONE-ACETAMINOPHEN 5-325 MG PO TABS: 1.0000 | ORAL_TABLET | Freq: Four times a day (QID) | ORAL | Status: DC | PRN

## 2013-10-15 MED ORDER — METHYLPREDNISOLONE (PAK) 4 MG PO TABS: ORAL_TABLET | ORAL | Status: DC

## 2013-10-15 MED ORDER — OXYCODONE-ACETAMINOPHEN 5-325 MG PO TABS
2.0000 | ORAL_TABLET | Freq: Once | ORAL | Status: AC
  Administered 2013-10-15: 2 via ORAL
  Filled 2013-10-15: qty 2

## 2013-10-15 MED ORDER — DOCUSATE SODIUM 100 MG PO CAPS: 100.0000 mg | ORAL_CAPSULE | Freq: Two times a day (BID) | ORAL | Status: DC

## 2013-10-15 NOTE — Discharge Instructions (Signed)

## 2013-10-15 NOTE — ED Notes (Addendum)
Pt has had ongoing back pain, reports worse over past 2 weeks. States pain radiating down both legs, left worse. Hx of 3 back surgeries. Has appt with Dr. Vertell Limber May 13, but pain too severe to wait. No incontinence of bowel/bladder. No new injury, just progressively getting worse.

## 2013-10-15 NOTE — ED Provider Notes (Signed)
Face-to-face encounter: Patient discussed with myself and Etta Quill NP.  Patient examined. History reviewed. He does not have any findings that would suggest acute herniated nucleus, or cauda equina. She is not incontinent. She has bilateral extremity pain. She has 1+ knee jerk, and Achilles reflexes. She does not have foot drop. She can fully extend at the knee. She is able to fully flex at the knee. Her gait is painful, but intact.  Plan is pain control. Medrol Dosepak. Arrangements for outpatient MRI for the patient. She has a neurosurgical appointment 9 days from today with Dr. Julaine Fusi.   this is an appropriate plan. I have or progressive symptoms.reviewed return symptoms with her including incontinence, footdrop,  Or any other progression of symptoms.  Tanna Furry, MD 10/15/13 [phone]

## 2013-10-15 NOTE — ED Provider Notes (Signed)
CSN: [id number]     Arrival date & time 10/15/13  1404 History  This chart was scribed for Etta Quill, NP working with Tanna Furry, MD, by Marcha Dutton ED Scribe. This patient was seen in room TR04C/TR04C and the patient's care was started at 4:03 PM.    Chief Complaint  Patient presents with  . Back Pain    The history is provided by the patient and the spouse. No language interpreter was used.   HPI Comments: Stephanie Alvarado is a 73 y.o. female who presents to the Emergency Department complaining of gradually worsening back pain radiating to her butt that began in November. She states her pain has become severe over the last few weeks, and she can't get in with Dr. Vertell Limber until Oct 23, 2013. She states her legs also throb while she is in bed. Pt reports she has had 3 Lumbar disc surgeries. She reports she hasn't done anything straining. She states she has pain getting in and out of bed, with twisting, moving, coughing, and yawning that makes her "scream and cry." She notes she is ambulatory but not without pain. Pt states she has been taking aleve without any significant results. Pt reports she has not taken her Plavix for three days in the event she can get some help for her back. Pt denies bowel or bladder incontinence, and weakness in the legs. Pt requests an emergent MRI.   Past Medical History  Diagnosis Date  . Hypertension   . CAD (coronary artery disease)     DES and circumflex 2006 /  DES to LAD 2011  . Hyperlipemia   . Hiatal hernia   . Esophageal reflux   . Lumbar disc disease   . Allergic rhinitis, cause unspecified   . Sleep related hypoventilation/hypoxemia in conditions classifiable elsewhere   . Shortness of breath   . Other diseases of lung, not elsewhere classified   . Chronic diastolic heart failure   . Peripheral vascular disease, unspecified   . Unspecified venous (peripheral) insufficiency   . Obesity, unspecified   . Cyst of thyroid   . Diverticulosis of  colon (without mention of hemorrhage)   . Personal history of colonic polyps     ADENOMATOUS POLYP  . Osteoarthrosis, unspecified whether generalized or localized, unspecified site   . Lumbago   . Nonspecific abnormal finding in stool contents   . Unspecified cerebral artery occlusion with cerebral infarction   . Anxiety state, unspecified   . Anemia, unspecified   . Stroke 11/2008    Treated with TPA? /     2010, TEE no left atrial clot, probably from atherosclerosis of the aortic arch  . Ejection fraction      EF 60%, Echo, November 12, 2008, /  EF 55%, TEE, November 17, 2008  . Atherosclerosis of aorta     TEE, June, 2010, grade 4 aortic arch atherosclerosis , Dr. Aundra Dubin  . Dysphasia     Possibly esophageal stricture  . Carotid artery disease     Doppler, November, 2011, stable, 0-39% bilateral, turbulent flow left subclavian  . Palpitations     October, 2012  . Subclavian artery disease     Remote surgery by Dr. Victorino Dike.  Marland Kitchen PFO (patent foramen ovale)     Patient had a very small PFO by TEE 2010.  This was seen only by bubble analysis during Valsalva  . MVP (mitral valve prolapse)   . Osteopenia     Past Surgical  History  Procedure Laterality Date  . Coronary angioplasty with stent placement  1990's  . Laparoscopic hysterectomy    . Urethral suspension  1993    Dr. Nori Riis  . Lumbar disc surgery      X 3  . Cholecystectomy    . Varicose vein surgery      x 2  . Cataracts      both eyes    Family History  Problem Relation Age of Onset  . Heart disease Father   . Breast cancer      cousions  . Alzheimer's disease Mother   . Colon cancer Paternal Aunt   . Esophageal cancer Neg Hx   . Rectal cancer Neg Hx   . Stomach cancer Neg Hx     History  Substance Use Topics  . Smoking status: Former Smoker -- 1.00 packs/day for 40 years    Types: Cigarettes    Quit date: 06/13/1998  . Smokeless tobacco: Never Used  . Alcohol Use: No    OB History   Grav Para Term Preterm  Abortions TAB SAB Ect Mult Living                  Review of Systems  Constitutional: Negative for appetite change.  HENT: Negative for rhinorrhea and sinus pressure.   Respiratory: Negative for cough and shortness of breath.   Gastrointestinal: Negative for nausea and vomiting.  Genitourinary: Negative for difficulty urinating.       Pt denies loss of bowel or bladder function  Musculoskeletal: Positive for back pain.  Neurological: Negative for weakness.  All other systems reviewed and are negative.   Allergies  Codeine  Home Medications   Prior to Admission medications   Medication Sig Start Date End Date Taking? Authorizing Provider  amLODipine (NORVASC) 5 MG tablet Take 5 mg by mouth daily.     Yes Historical Provider, MD  atorvastatin (LIPITOR) 40 MG tablet Take 20 mg by mouth Daily.  02/22/12  Yes Historical Provider, MD  buPROPion (WELLBUTRIN XL) 150 MG 24 hr tablet Take 150 mg by mouth daily.  02/02/12  Yes Historical Provider, MD  clopidogrel (PLAVIX) 75 MG tablet Take 1 tablet (75 mg total) by mouth daily. 06/03/13  Yes Carlena Bjornstad, MD  furosemide (LASIX) 40 MG tablet Take 40 mg by mouth daily as needed for edema.    Yes Historical Provider, MD  gabapentin (NEURONTIN) 600 MG tablet Take 600 mg by mouth 3 (three) times daily.   Yes Historical Provider, MD  isosorbide mononitrate (IMDUR) 30 MG 24 hr tablet TAKE 1 TABLET DAILY 12/15/10  Yes Noralee Space, MD  meloxicam (MOBIC) 15 MG tablet Take 15 mg by mouth daily. 05/02/13  Yes Historical Provider, MD  metoprolol (LOPRESSOR) 50 MG tablet Take 50 mg by mouth 2 (two) times daily.  12/15/10  Yes Noralee Space, MD  naproxen sodium (ANAPROX) 220 MG tablet Take 440-660 mg by mouth 2 (two) times daily as needed (for pain). Take 2 tablets in the morning and 3 tablets at bedtime   Yes Historical Provider, MD  nitroGLYCERIN (NITROSTAT) 0.4 MG SL tablet Place 1 tablet (0.4 mg total) under the tongue every 5 (five) minutes as needed.  03/14/11  Yes Carlena Bjornstad, MD  pantoprazole (PROTONIX) 40 MG tablet Take 40 mg by mouth at bedtime as needed (for indigestion).   Yes Historical Provider, MD  polyethylene glycol powder (GLYCOLAX/MIRALAX) powder Take 17 g by mouth daily. 04/06/12  Yes  Sable Feil, MD  zolpidem (AMBIEN) 10 MG tablet Take 1 tablet (10 mg total) by mouth at bedtime as needed. 04/03/12  Yes Carlena Bjornstad, MD    There were no vitals taken for this visit.    Physical Exam  Nursing note and vitals reviewed. Constitutional: She is oriented to person, place, and time. She appears well-developed and well-nourished. No distress.  Awake, alert, nontoxic appearance.  HENT:  Head: Atraumatic.  Eyes: EOM are normal. Pupils are equal, round, and reactive to light. Right eye exhibits no discharge. Left eye exhibits no discharge.  Neck: Normal range of motion. Neck supple.  Pulmonary/Chest: Effort normal. She exhibits no tenderness.  Abdominal: Soft. There is no tenderness. There is no rebound.  Musculoskeletal: She exhibits no tenderness.  Baseline ROM, no obvious new focal weakness.  Neurological: She is alert and oriented to person, place, and time. She has normal reflexes.  Mental status and motor strength appears baseline for patient and situation.  Skin: No rash noted. She is not diaphoretic.  Psychiatric: She has a normal mood and affect.    ED Course  Procedures (including critical care time)   COORDINATION OF CARE: 4:12 PM- Pt's parents advised of plan for treatment. Parents verbalize understanding and agreement with plan.   Labs Review Labs Reviewed - No data to display  Imaging Review No results found.   EKG Interpretation None      MDM   Final diagnoses:  None    Back pain.  Patient discussed with and seen by Dr. Jeneen Rinks.  No red flag symptoms. Will arrange outpatient MRI.  I personally performed the services described in this documentation, which was scribed in my presence. The  recorded information has been reviewed and is accurate.    Norman Herrlich, NP 11/02/13 [phone]

## 2013-10-16 ENCOUNTER — Ambulatory Visit
Admission: RE | Admit: 2013-10-16 | Discharge: 2013-10-16 | Disposition: A | Discharge status: Home / Self Care | Payer: Medicare HMO | Source: Ambulatory Visit | Attending: Nurse Practitioner | Admitting: Nurse Practitioner

## 2013-10-16 ENCOUNTER — Other Ambulatory Visit (HOSPITAL_COMMUNITY): Payer: Self-pay | Admitting: Nurse Practitioner

## 2013-10-16 DIAGNOSIS — M5416 Radiculopathy, lumbar region: Secondary | ICD-10-CM

## 2013-10-17 ENCOUNTER — Inpatient Hospital Stay: Admit: 2013-10-17 | Discharge: 2013-10-17 | Disposition: A | Discharge status: Home / Self Care | Payer: Medicare Other

## 2013-10-29 ENCOUNTER — Encounter: Payer: Self-pay | Admitting: Cardiology

## 2013-10-29 ENCOUNTER — Ambulatory Visit (INDEPENDENT_AMBULATORY_CARE_PROVIDER_SITE_OTHER): Payer: Medicare HMO | Admitting: Cardiology

## 2013-10-29 ENCOUNTER — Other Ambulatory Visit: Payer: Self-pay | Admitting: *Deleted

## 2013-10-29 VITALS — BP 132/64 | HR 72 | Ht 67.5 in | Wt 192.0 lb

## 2013-10-29 DIAGNOSIS — Z01810 Encounter for preprocedural cardiovascular examination: Secondary | ICD-10-CM | POA: Insufficient documentation

## 2013-10-29 DIAGNOSIS — I2510 Atherosclerotic heart disease of native coronary artery without angina pectoris: Secondary | ICD-10-CM

## 2013-10-29 DIAGNOSIS — I739 Peripheral vascular disease, unspecified: Secondary | ICD-10-CM

## 2013-10-29 DIAGNOSIS — I779 Disorder of arteries and arterioles, unspecified: Secondary | ICD-10-CM

## 2013-10-29 NOTE — Progress Notes (Signed)
Patient ID: Stephanie Alvarado, female   DOB: December 27, 1940, 73 y.o.   MRN: [id number]    HPI  The patient is seen today to help with cardiac clearance for surgery that she needs to her lower back by Dr. Vertell Limber of neurosurgery. She has known coronary disease. This has been stable. I saw her last December, 2014. She was stable at that time. She's had coronary interventions in the past. In the past her symptom of ischemia was shortness of breath. This had improved in the past and it is not a marked problem at this time. She is not having any chest pain.  Allergies  Allergen Reactions  . Codeine     REACTION: itch    Current Outpatient Prescriptions  Medication Sig Dispense Refill  . amLODipine (NORVASC) 5 MG tablet Take 5 mg by mouth daily.        Marland Kitchen atorvastatin (LIPITOR) 40 MG tablet Take 20 mg by mouth Daily.       . clopidogrel (PLAVIX) 75 MG tablet Take 1 tablet (75 mg total) by mouth daily.  90 tablet  3  . gabapentin (NEURONTIN) 600 MG tablet Take 600 mg by mouth 3 (three) times daily.      . isosorbide mononitrate (IMDUR) 30 MG 24 hr tablet TAKE 1 TABLET DAILY  90 tablet  3  . meloxicam (MOBIC) 15 MG tablet Take 15 mg by mouth daily.      . methylPREDNIsolone (MEDROL DOSPACK) 4 MG tablet follow package directions  21 tablet  0  . metoprolol (LOPRESSOR) 50 MG tablet Take 50 mg by mouth 2 (two) times daily.       . naproxen sodium (ANAPROX) 220 MG tablet Take 440-660 mg by mouth 2 (two) times daily as needed (for pain). Take 2 tablets in the morning and 3 tablets at bedtime      . nitroGLYCERIN (NITROSTAT) 0.4 MG SL tablet Place 1 tablet (0.4 mg total) under the tongue every 5 (five) minutes as needed.  25 tablet  3  . pantoprazole (PROTONIX) 40 MG tablet Take 40 mg by mouth at bedtime as needed (for indigestion).      . furosemide (LASIX) 40 MG tablet Take 40 mg by mouth daily as needed for edema.       Marland Kitchen HYDROcodone-acetaminophen (NORCO/VICODIN) 5-325 MG per tablet Take 1-2 tablets by mouth  every 6 (six) hours as needed for severe pain.  15 tablet  0  . polyethylene glycol powder (GLYCOLAX/MIRALAX) powder Take 17 g by mouth daily.      Marland Kitchen zolpidem (AMBIEN) 10 MG tablet Take 1 tablet (10 mg total) by mouth at bedtime as needed.  15 tablet  3   No current facility-administered medications for this visit.    History   Social History  . Marital Status: Widowed    Spouse Name: N/A    Number of Children: 4  . Years of Education: N/A   Occupational History  . Retired    Social History Main Topics  . Smoking status: Former Smoker -- 1.00 packs/day for 40 years    Types: Cigarettes    Quit date: 06/13/1998  . Smokeless tobacco: Never Used  . Alcohol Use: No  . Drug Use: No  . Sexual Activity: Not on file   Other Topics Concern  . Not on file   Social History Narrative  . No narrative on file    Family History  Problem Relation Age of Onset  . Heart disease Father   .  Breast cancer      cousions  . Alzheimer's disease Mother   . Colon cancer Paternal Aunt   . Esophageal cancer Neg Hx   . Rectal cancer Neg Hx   . Stomach cancer Neg Hx     Past Medical History  Diagnosis Date  . Hypertension   . CAD (coronary artery disease)     DES and circumflex 2006 /  DES to LAD 2011  . Hyperlipemia   . Hiatal hernia   . Esophageal reflux   . Lumbar disc disease   . Allergic rhinitis, cause unspecified   . Sleep related hypoventilation/hypoxemia in conditions classifiable elsewhere   . Shortness of breath   . Other diseases of lung, not elsewhere classified   . Chronic diastolic heart failure   . Peripheral vascular disease, unspecified   . Unspecified venous (peripheral) insufficiency   . Obesity, unspecified   . Cyst of thyroid   . Diverticulosis of colon (without mention of hemorrhage)   . Personal history of colonic polyps     ADENOMATOUS POLYP  . Osteoarthrosis, unspecified whether generalized or localized, unspecified site   . Lumbago   . Nonspecific  abnormal finding in stool contents   . Unspecified cerebral artery occlusion with cerebral infarction   . Anxiety state, unspecified   . Anemia, unspecified   . Stroke 11/2008    Treated with TPA? /     2010, TEE no left atrial clot, probably from atherosclerosis of the aortic arch  . Ejection fraction      EF 60%, Echo, November 12, 2008, /  EF 55%, TEE, November 17, 2008  . Atherosclerosis of aorta     TEE, June, 2010, grade 4 aortic arch atherosclerosis , Dr. Aundra Dubin  . Dysphasia     Possibly esophageal stricture  . Carotid artery disease     Doppler, November, 2011, stable, 0-39% bilateral, turbulent flow left subclavian  . Palpitations     October, 2012  . Subclavian artery disease     Remote surgery by Dr. Victorino Dike.  Marland Kitchen PFO (patent foramen ovale)     Patient had a very small PFO by TEE 2010.  This was seen only by bubble analysis during Valsalva  . MVP (mitral valve prolapse)   . Osteopenia     Past Surgical History  Procedure Laterality Date  . Coronary angioplasty with stent placement  1990's  . Laparoscopic hysterectomy    . Urethral suspension  1993    Dr. Nori Riis  . Lumbar disc surgery      X 3  . Cholecystectomy    . Varicose vein surgery      x 2  . Cataracts      both eyes    Patient Active Problem List   Diagnosis Date Noted  . Palpitations     Priority: High  . Subclavian artery disease     Priority: High  . PFO (patent foramen ovale)     Priority: High  . CAD (coronary artery disease)     Priority: High  . Ejection fraction     Priority: High  . Atherosclerosis of aorta     Priority: High  . Carotid artery disease     Priority: High  . LLQ abdominal pain 04/05/2013  . Diverticulitis 04/05/2013  . 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 CONSTIPATION 06/11/2010  . ABDOMINAL PAIN-LLQ 06/11/2010  . PERSONAL HX COLONIC POLYPS 06/11/2010  . SLEEP RELATED HYPOVENTILATION/HYPOXEMIA CCE 07/17/2009  . Stroke 11/11/2008  .  COLONIC POLYPS 08/31/2008  . THYROID CYST  08/31/2008  . VENOUS INSUFFICIENCY 08/31/2008  . DIVERTICULOSIS OF COLON 08/31/2008  . LOW BACK PAIN, CHRONIC 08/31/2008  . 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 DYSPNEA 08/08/2008  . HYPERCHOLESTEROLEMIA 04/19/2007  . OBESITY 04/19/2007  . ANEMIA 04/19/2007  . ANXIETY 04/19/2007  . HYPERTENSION 04/19/2007  . ALLERGIC RHINITIS 04/19/2007  . PULMONARY NODULE, RIGHT LOWER LOBE 04/19/2007  . GERD 04/19/2007  . DEGENERATIVE JOINT DISEASE 04/19/2007    ROS   Patient denies fever, chills, headache, rash, change in vision, change in hearing, chest pain, cough, nausea vomiting, urinary symptoms. All other systems are reviewed and are negative.  PHYSICAL EXAM  Patient is overweight. She's wearing a back support brace. She is oriented to person time and place. Affect is normal. She is here with a family member. Head is atraumatic. Sclera and conjunctiva are normal. There is no jugulovenous distention. Lungs are clear. Respiratory effort is nonlabored. Cardiac exam reveals an S1 and S2. The abdomen is soft. There is no peripheral edema. There no musculoskeletal deformities. There are no skin rashes.  Filed Vitals:   10/29/13 1538  BP: 132/64  Pulse: 72  Height: 5' 7.5" (1.715 m)  Weight: 192 lb (87.091 kg)   EKG is reviewed from her visit in December, 2014. There was no significant abnormality at that time.  ASSESSMENT & PLAN

## 2013-10-29 NOTE — Assessment & Plan Note (Signed)
The patient is stable from the cardiac viewpoint. She can be cleared for back surgery by Dr.Stern. She has had an intervention within the past 5 years. Following this she had a nuclear scan with no ischemia. She has not had any recent symptoms. She has not had a myocardial infarction or significant arrhythmias. There is been no congestive heart failure. Her activity level is adequate, although it is limited by her back pain. She is cleared for back surgery.  As part of today's evaluation I spent greater than 25 minutes with her total care. More than half of this has been with direct contact with her about making decisions about whether she can be cleared for surgery.

## 2013-10-29 NOTE — Assessment & Plan Note (Signed)
The patient has had vascular surgery in the past. Her most recent Doppler was October, 2013. She had mild bilateral disease. The and to side anastomosis of the left common carotid artery was patent. I will talk to her about the timing of her followup carotid.

## 2013-10-29 NOTE — Patient Instructions (Addendum)
Your physician recommends that you continue on your current medications as directed. Please refer to the Current Medication list given to you today.  Your physician has requested that you have a carotid duplex. This test is an ultrasound of the carotid arteries in your neck. It looks at blood flow through these arteries that supply the brain with blood. Allow one hour for this exam. There are no restrictions or special instructions.  Your physician wants you to follow-up in: 6 months. You will receive a reminder letter in the mail two months in advance. If you don't receive a letter, please call our office to schedule the follow-up appointment.  

## 2013-10-29 NOTE — Assessment & Plan Note (Signed)
Coronary disease is stable. Her last intervention was 2011. She actually had a nuclear study after that revealing some breast attenuation but no ischemia. There is normal left ventricular function. She's not having any significant symptoms. No further workup.

## 2013-10-30 ENCOUNTER — Other Ambulatory Visit: Payer: Self-pay | Admitting: Neurosurgery

## 2013-11-03 NOTE — ED Provider Notes (Signed)
Medical screening examination/treatment/procedure(s) were performed by non-physician practitioner and as supervising physician I was immediately available for consultation/collaboration.   EKG Interpretation None        Kaori Jumper, MD 11/03/13 0614 

## 2013-11-13 NOTE — Pre-Procedure Instructions (Addendum)
Stephanie Alvarado  11/13/2013   Your procedure is scheduled on:  Thursday, November 21, 2013.  Report to [facility] Admitting at 9:30 AM.  Call this number if you have problems the morning of surgery: [phone]   Remember:   Do not eat food or drink liquids after midnight.    Take these medicines the morning of surgery with A SIP OF WATER: norvasc,neurontin,hydrocodone,imdur,metoprolol,protonix, methylprednisolone (Medrol Dospack)  Continue to take all your other medicines as you normally do until day of surgery and then follow above instructions.  STOP taking Plavix as directed by physician. Stop taking Aspirin, meloxicam (Mobic), naproxen sodium (Anaprox/Aleve), Goody's, BC's, Aleve (Naproxen), Ibuprofen (Advil or Motrin), Fish Oil, Vitamins, Herbal Supplements or any substance that could thin your blood starting today, Thursday, November 14, 2013.    Do not wear jewelry, make-up or nail polish.  Do not wear lotions, powders, or perfumes. You may wear deodorant.  Do not shave 48 hours prior to surgery.  Do not bring valuables to the hospital.  [facility] is not responsible                  for any belongings or valuables.               Contacts, dentures or bridgework may not be worn into surgery.  Leave suitcase in the car. After surgery it may be brought to your room.  For patients admitted to the hospital, discharge time is determined by your                treatment team.               Patients discharged the day of surgery will not be allowed to drive  home.     Special Instructions:Please use CHG soap the night before surgery and the day of surgery. CHG soap should be used atleast twice.   Please read over the following fact sheets that you were given: Pain Booklet, Coughing and Deep Breathing, Blood Transfusion Information, MRSA Information and Surgical Site Infection Prevention

## 2013-11-14 ENCOUNTER — Encounter (HOSPITAL_COMMUNITY)
Admission: RE | Admit: 2013-11-14 | Discharge: 2013-11-14 | Disposition: A | Discharge status: Home / Self Care | Payer: Medicare HMO | Source: Ambulatory Visit | Attending: Neurosurgery | Admitting: Neurosurgery

## 2013-11-14 ENCOUNTER — Encounter (HOSPITAL_COMMUNITY): Payer: Self-pay

## 2013-11-14 ENCOUNTER — Encounter (HOSPITAL_COMMUNITY)
Admission: RE | Admit: 2013-11-14 | Discharge: 2013-11-14 | Disposition: A | Discharge status: Home / Self Care | Payer: Medicare HMO | Source: Ambulatory Visit | Attending: Anesthesiology | Admitting: Anesthesiology

## 2013-11-14 DIAGNOSIS — Z01812 Encounter for preprocedural laboratory examination: Secondary | ICD-10-CM | POA: Insufficient documentation

## 2013-11-14 DIAGNOSIS — Z01818 Encounter for other preprocedural examination: Secondary | ICD-10-CM | POA: Insufficient documentation

## 2013-11-14 HISTORY — DX: Sleep apnea, unspecified: G47.30

## 2013-11-14 LAB — TYPE AND SCREEN
ABO/RH(D): O POS
ANTIBODY SCREEN: NEGATIVE

## 2013-11-14 LAB — SURGICAL PCR SCREEN
MRSA, PCR: NEGATIVE
Staphylococcus aureus: NEGATIVE

## 2013-11-14 LAB — BASIC METABOLIC PANEL
BUN: 17 mg/dL (ref 6–23)
CO2: 27 mEq/L (ref 19–32)
Calcium: 10 mg/dL (ref 8.4–10.5)
Chloride: 102 mEq/L (ref 96–112)
Creatinine, Ser: 0.83 mg/dL (ref 0.50–1.10)
GFR calc non Af Amer: 68 mL/min — ABNORMAL LOW (ref >90)
GFR, EST AFRICAN AMERICAN: 79 mL/min — AB (ref >90)
GLUCOSE: 100 mg/dL — AB (ref 70–99)
POTASSIUM: 4.7 meq/L (ref 3.7–5.3)
Sodium: 141 mEq/L (ref 137–147)

## 2013-11-14 LAB — CBC
HCT: 40.3 % (ref 36.0–46.0)
HEMOGLOBIN: 13.8 g/dL (ref 12.0–15.0)
MCH: 32.3 pg (ref 26.0–34.0)
MCHC: 34.2 g/dL (ref 30.0–36.0)
MCV: 94.4 fL (ref 78.0–100.0)
Platelets: 225 10*3/uL (ref 150–400)
RBC: 4.27 MIL/uL (ref 3.87–5.11)
RDW: 14.1 % (ref 11.5–15.5)
WBC: 5.9 10*3/uL (ref 4.0–10.5)

## 2013-11-14 LAB — ABO/RH: ABO/RH(D): O POS

## 2013-11-15 NOTE — Progress Notes (Signed)
Anesthesia Chart Review: Patient is a 73 year old female scheduled for L3-4 exploration and extension of fusion on 11/21/13 by Dr. Vertell Limber.  History includes former smoker, CAD s/p RCA DES '06 and LAD DES '09, MVP, PFO, palpitations, chronic diastolic CHF, right MCA infact '10, HLD, HTN, PAD with subclavian artery and carotid artery disease s/p left CCA to SCA bypass,  GERD, hiatal hernia, thyroid cyst, diverticulosis, anxiety, dysphagia, OSA, hysterectomy, cholecystectomy, back surgery, urethral suspension, varicose vein surgery. PCP is listed as Dr. Gar Ponto.  Cardiologist is Dr. Ron Parker who cleared her for this procedure.  Plavix was held starting 11/14/13.  Nuclear stress test on 10/20/09 showed: Overall Impression: Probably normal lexiscan nuclear study with breast attenuation but no ischemia. EF 57%. Normal wall motion.  Echo on 11/17/08 showed: 1. Left ventricle: The cavity size was normal. Wall thickness was normal. Systolic function was normal. The estimated ejection fraction was 55%. Wall motion was normal; there were no regional wall motion abnormalities. 2. Aorta: Normal thoracic aorta caliber. Grade IV atherosclerosis in the aortic arch and descending thoracic aorta. 3. Left atrium: No evidence of thrombus in the atrial cavity or appendage. 4. Right atrium: No evidence of thrombus in the atrial cavity or appendage. 5. Atrial septum: There was a small patent foramen ovale, noted during bubble study only with Valsalva. 6. Pulmonic valve: No evidence of vegetation. Impressions: Most likely source for embolism from this study would be aortic arch atherosclerosis. There is no LA thrombus. There is a very small PFO.  Cardiac cath on 04/21/08 showed: CONCLUSION: Coronary artery disease with 80% narrowing in the proximal LAD and 70% ostial stenosis in the large septal perforator, no major obstruction of circumflex artery, less than 10% narrowing at the stent site in the mid right coronary with 40%  proximal and 30% distal stenosis in the right coronary, and minimal inferior wall hypokinesis with an estimated ejection fraction of 60%. She underwent successful PCI of the lesion in the proximal LAD using a PROMUS drug-eluting stent with improvement in center narrowing from 70 - 80% to 0% on 04/25/08.  EKG on 06/03/13 showed SB at 54 bpm.  CXR on 11/14/13 showed: No active cardiopulmonary disease.  Carotid duplex on 04/04/12 showed: 0-39% bilateral ICA stenosis, end to side left CCA to SCA bypass.  Preoperative labs noted.  If no acute changes then I would anticipate that she could proceed as planned.  George Hugh [facility] Short Stay Center/Anesthesiology Phone [phone] 11/15/2013 1:01 PM

## 2013-11-20 MED ORDER — CEFAZOLIN SODIUM-DEXTROSE 2-3 GM-% IV SOLR
2.0000 g | INTRAVENOUS | Status: AC
  Administered 2013-11-21: 2 g via INTRAVENOUS
  Filled 2013-11-20: qty 50

## 2013-11-21 ENCOUNTER — Encounter (HOSPITAL_COMMUNITY)
Admission: RE | Disposition: A | Discharge status: Skilled Nursing Facility | Payer: Medicare HMO | Source: Ambulatory Visit | Attending: Neurosurgery

## 2013-11-21 ENCOUNTER — Inpatient Hospital Stay (HOSPITAL_COMMUNITY)
Admission: RE | Admit: 2013-11-21 | Discharge: 2013-11-27 | DRG: 460 | Disposition: A | Discharge status: Skilled Nursing Facility | Payer: Medicare HMO | Source: Ambulatory Visit | Attending: Neurosurgery | Admitting: Neurosurgery

## 2013-11-21 ENCOUNTER — Inpatient Hospital Stay (HOSPITAL_COMMUNITY): Payer: Medicare HMO | Admitting: Anesthesiology

## 2013-11-21 ENCOUNTER — Inpatient Hospital Stay (HOSPITAL_COMMUNITY): Payer: Medicare HMO

## 2013-11-21 ENCOUNTER — Encounter (HOSPITAL_COMMUNITY): Payer: Self-pay | Admitting: *Deleted

## 2013-11-21 ENCOUNTER — Encounter (HOSPITAL_COMMUNITY): Payer: Medicare HMO | Admitting: Vascular Surgery

## 2013-11-21 DIAGNOSIS — G4730 Sleep apnea, unspecified: Secondary | ICD-10-CM | POA: Diagnosis present

## 2013-11-21 DIAGNOSIS — M419 Scoliosis, unspecified: Secondary | ICD-10-CM | POA: Diagnosis present

## 2013-11-21 DIAGNOSIS — Z8673 Personal history of transient ischemic attack (TIA), and cerebral infarction without residual deficits: Secondary | ICD-10-CM

## 2013-11-21 DIAGNOSIS — M4120 Other idiopathic scoliosis, site unspecified: Secondary | ICD-10-CM | POA: Diagnosis present

## 2013-11-21 DIAGNOSIS — Z885 Allergy status to narcotic agent status: Secondary | ICD-10-CM

## 2013-11-21 DIAGNOSIS — I10 Essential (primary) hypertension: Secondary | ICD-10-CM | POA: Diagnosis present

## 2013-11-21 DIAGNOSIS — Z9861 Coronary angioplasty status: Secondary | ICD-10-CM

## 2013-11-21 DIAGNOSIS — M7130 Other bursal cyst, unspecified site: Secondary | ICD-10-CM | POA: Diagnosis present

## 2013-11-21 DIAGNOSIS — Z87891 Personal history of nicotine dependence: Secondary | ICD-10-CM

## 2013-11-21 DIAGNOSIS — M5126 Other intervertebral disc displacement, lumbar region: Principal | ICD-10-CM | POA: Diagnosis present

## 2013-11-21 DIAGNOSIS — Z981 Arthrodesis status: Secondary | ICD-10-CM

## 2013-11-21 DIAGNOSIS — K219 Gastro-esophageal reflux disease without esophagitis: Secondary | ICD-10-CM | POA: Diagnosis present

## 2013-11-21 DIAGNOSIS — M47817 Spondylosis without myelopathy or radiculopathy, lumbosacral region: Secondary | ICD-10-CM | POA: Diagnosis present

## 2013-11-21 DIAGNOSIS — I2510 Atherosclerotic heart disease of native coronary artery without angina pectoris: Secondary | ICD-10-CM | POA: Diagnosis present

## 2013-11-21 DIAGNOSIS — I739 Peripheral vascular disease, unspecified: Secondary | ICD-10-CM | POA: Diagnosis present

## 2013-11-21 SURGERY — POSTERIOR LUMBAR FUSION 1 LEVEL
Anesthesia: General | Site: Back

## 2013-11-21 MED ORDER — METOPROLOL TARTRATE 50 MG PO TABS
50.0000 mg | ORAL_TABLET | Freq: Two times a day (BID) | ORAL | Status: DC
  Administered 2013-11-21 – 2013-11-27 (×11): 50 mg via ORAL
  Filled 2013-11-21 (×13): qty 1

## 2013-11-21 MED ORDER — ROCURONIUM BROMIDE 100 MG/10ML IV SOLN
INTRAVENOUS | Status: DC | PRN
  Administered 2013-11-21: 10 mg via INTRAVENOUS
  Administered 2013-11-21: 50 mg via INTRAVENOUS
  Administered 2013-11-21 (×4): 10 mg via INTRAVENOUS

## 2013-11-21 MED ORDER — CEFAZOLIN SODIUM 1-5 GM-% IV SOLN
1.0000 g | Freq: Three times a day (TID) | INTRAVENOUS | Status: AC
  Administered 2013-11-21 – 2013-11-22 (×2): 1 g via INTRAVENOUS
  Filled 2013-11-21 (×2): qty 50

## 2013-11-21 MED ORDER — ONDANSETRON HCL 4 MG/2ML IJ SOLN
INTRAMUSCULAR | Status: AC
  Filled 2013-11-21: qty 2

## 2013-11-21 MED ORDER — SODIUM CHLORIDE 0.9 % IJ SOLN
INTRAMUSCULAR | Status: AC
  Filled 2013-11-21: qty 10

## 2013-11-21 MED ORDER — ONDANSETRON HCL 4 MG/2ML IJ SOLN
INTRAMUSCULAR | Status: DC | PRN
  Administered 2013-11-21: 4 mg via INTRAVENOUS

## 2013-11-21 MED ORDER — PROMETHAZINE HCL 25 MG/ML IJ SOLN: 6.2500 mg | INTRAMUSCULAR | Status: DC | PRN

## 2013-11-21 MED ORDER — KCL IN DEXTROSE-NACL 20-5-0.45 MEQ/L-%-% IV SOLN
INTRAVENOUS | Status: DC
  Administered 2013-11-21: 21:00:00 via INTRAVENOUS
  Administered 2013-11-23: 1000 mL via INTRAVENOUS
  Administered 2013-11-24: 07:00:00 via INTRAVENOUS
  Filled 2013-11-21 (×12): qty 1000

## 2013-11-21 MED ORDER — ACETAMINOPHEN 650 MG RE SUPP
650.0000 mg | RECTAL | Status: DC | PRN
  Filled 2013-11-21: qty 1

## 2013-11-21 MED ORDER — ROCURONIUM BROMIDE 50 MG/5ML IV SOLN
INTRAVENOUS | Status: AC
  Filled 2013-11-21: qty 1

## 2013-11-21 MED ORDER — ISOSORBIDE MONONITRATE ER 30 MG PO TB24
30.0000 mg | ORAL_TABLET | Freq: Every day | ORAL | Status: DC
  Administered 2013-11-21 – 2013-11-27 (×7): 30 mg via ORAL
  Filled 2013-11-21 (×7): qty 1

## 2013-11-21 MED ORDER — ATORVASTATIN CALCIUM 20 MG PO TABS
20.0000 mg | ORAL_TABLET | Freq: Every day | ORAL | Status: DC
  Administered 2013-11-22 – 2013-11-26 (×5): 20 mg via ORAL
  Filled 2013-11-21 (×6): qty 1

## 2013-11-21 MED ORDER — ACETAMINOPHEN 325 MG PO TABS
650.0000 mg | ORAL_TABLET | ORAL | Status: DC | PRN
  Administered 2013-11-25 (×2): 650 mg via ORAL
  Filled 2013-11-21 (×2): qty 2

## 2013-11-21 MED ORDER — DIPHENHYDRAMINE HCL 25 MG PO CAPS
25.0000 mg | ORAL_CAPSULE | Freq: Four times a day (QID) | ORAL | Status: DC | PRN
  Administered 2013-11-21 – 2013-11-22 (×2): 25 mg via ORAL
  Filled 2013-11-21 (×2): qty 1

## 2013-11-21 MED ORDER — MIDAZOLAM HCL 5 MG/5ML IJ SOLN
INTRAMUSCULAR | Status: DC | PRN
  Administered 2013-11-21: 2 mg via INTRAVENOUS

## 2013-11-21 MED ORDER — GABAPENTIN 300 MG PO CAPS
600.0000 mg | ORAL_CAPSULE | Freq: Three times a day (TID) | ORAL | Status: DC
  Administered 2013-11-21: 600 mg via ORAL
  Administered 2013-11-22: 300 mg via ORAL
  Administered 2013-11-22: 600 mg via ORAL
  Administered 2013-11-22 – 2013-11-23 (×2): 300 mg via ORAL
  Administered 2013-11-23: 600 mg via ORAL
  Administered 2013-11-23 – 2013-11-25 (×5): 300 mg via ORAL
  Administered 2013-11-25 – 2013-11-27 (×5): 600 mg via ORAL
  Filled 2013-11-21 (×19): qty 2

## 2013-11-21 MED ORDER — HYDROMORPHONE HCL PF 1 MG/ML IJ SOLN
INTRAMUSCULAR | Status: AC
  Administered 2013-11-21: 0.5 mg via INTRAVENOUS
  Filled 2013-11-21: qty 1

## 2013-11-21 MED ORDER — DIAZEPAM 5 MG PO TABS
ORAL_TABLET | ORAL | Status: AC
  Administered 2013-11-21: 5 mg via ORAL
  Filled 2013-11-21: qty 1

## 2013-11-21 MED ORDER — PANTOPRAZOLE SODIUM 40 MG IV SOLR
40.0000 mg | Freq: Every day | INTRAVENOUS | Status: DC
  Administered 2013-11-21 – 2013-11-22 (×2): 40 mg via INTRAVENOUS
  Filled 2013-11-21 (×3): qty 40

## 2013-11-21 MED ORDER — NITROGLYCERIN 0.4 MG SL SUBL: 0.4000 mg | SUBLINGUAL_TABLET | SUBLINGUAL | Status: DC | PRN

## 2013-11-21 MED ORDER — MORPHINE SULFATE 2 MG/ML IJ SOLN
1.0000 mg | INTRAMUSCULAR | Status: DC | PRN
  Administered 2013-11-21 (×2): 2 mg via INTRAVENOUS
  Administered 2013-11-22: 1 mg via INTRAVENOUS
  Administered 2013-11-22: 2 mg via INTRAVENOUS
  Filled 2013-11-21 (×4): qty 1

## 2013-11-21 MED ORDER — EYE VITAMINS PO CAPS: 1.0000 | ORAL_CAPSULE | Freq: Every day | ORAL | Status: DC

## 2013-11-21 MED ORDER — SODIUM CHLORIDE 0.9 % IV SOLN
INTRAVENOUS | Status: DC | PRN
  Administered 2013-11-21: 17:00:00 via INTRAVENOUS

## 2013-11-21 MED ORDER — DEXAMETHASONE SODIUM PHOSPHATE 10 MG/ML IJ SOLN
INTRAMUSCULAR | Status: DC | PRN
Start: 2013-11-21 — End: 2013-11-21
  Administered 2013-11-21: 10 mg via INTRAVENOUS

## 2013-11-21 MED ORDER — NEOSTIGMINE METHYLSULFATE 10 MG/10ML IV SOLN
INTRAVENOUS | Status: AC
Start: 2013-11-21 — End: 2013-11-21
  Filled 2013-11-21: qty 1

## 2013-11-21 MED ORDER — AMLODIPINE BESYLATE 5 MG PO TABS
5.0000 mg | ORAL_TABLET | Freq: Every day | ORAL | Status: DC
  Administered 2013-11-22 – 2013-11-27 (×5): 5 mg via ORAL
  Filled 2013-11-21 (×6): qty 1

## 2013-11-21 MED ORDER — DIPHENHYDRAMINE HCL 25 MG PO CAPS
25.0000 mg | ORAL_CAPSULE | Freq: Once | ORAL | Status: AC
  Administered 2013-11-21: 25 mg via ORAL
  Filled 2013-11-21: qty 1

## 2013-11-21 MED ORDER — GLYCOPYRROLATE 0.2 MG/ML IJ SOLN
INTRAMUSCULAR | Status: DC | PRN
  Administered 2013-11-21: 0.6 mg via INTRAVENOUS

## 2013-11-21 MED ORDER — BISACODYL 10 MG RE SUPP: 10.0000 mg | Freq: Every day | RECTAL | Status: DC | PRN

## 2013-11-21 MED ORDER — PHENYLEPHRINE HCL 10 MG/ML IJ SOLN
10.0000 mg | INTRAVENOUS | Status: DC | PRN
  Administered 2013-11-21: 20 ug/min via INTRAVENOUS

## 2013-11-21 MED ORDER — SUFENTANIL CITRATE 50 MCG/ML IV SOLN
INTRAVENOUS | Status: DC | PRN
  Administered 2013-11-21 (×3): 5 ug via INTRAVENOUS
  Administered 2013-11-21 (×3): 10 ug via INTRAVENOUS
  Administered 2013-11-21: 5 ug via INTRAVENOUS

## 2013-11-21 MED ORDER — SUFENTANIL CITRATE 50 MCG/ML IV SOLN
INTRAVENOUS | Status: AC
  Filled 2013-11-21: qty 1

## 2013-11-21 MED ORDER — SENNA 8.6 MG PO TABS
1.0000 | ORAL_TABLET | Freq: Two times a day (BID) | ORAL | Status: DC
  Administered 2013-11-21 – 2013-11-27 (×12): 8.6 mg via ORAL
  Filled 2013-11-21 (×14): qty 1

## 2013-11-21 MED ORDER — LIDOCAINE-EPINEPHRINE 1 %-1:100000 IJ SOLN
INTRAMUSCULAR | Status: DC | PRN
  Administered 2013-11-21: 5 mL

## 2013-11-21 MED ORDER — FUROSEMIDE 40 MG PO TABS
40.0000 mg | ORAL_TABLET | Freq: Every day | ORAL | Status: DC | PRN
  Filled 2013-11-21: qty 1

## 2013-11-21 MED ORDER — SURGIFOAM 100 EX MISC
CUTANEOUS | Status: DC | PRN
  Administered 2013-11-21: 14:00:00 via TOPICAL

## 2013-11-21 MED ORDER — ALUM & MAG HYDROXIDE-SIMETH 200-200-20 MG/5ML PO SUSP: 30.0000 mL | Freq: Four times a day (QID) | ORAL | Status: DC | PRN

## 2013-11-21 MED ORDER — LACTATED RINGERS IV SOLN: INTRAVENOUS | Status: DC | PRN

## 2013-11-21 MED ORDER — TIZANIDINE HCL 2 MG PO TABS
2.0000 mg | ORAL_TABLET | Freq: Three times a day (TID) | ORAL | Status: DC
  Administered 2013-11-21 – 2013-11-27 (×17): 2 mg via ORAL
  Filled 2013-11-21 (×19): qty 1

## 2013-11-21 MED ORDER — DOCUSATE SODIUM 100 MG PO CAPS
100.0000 mg | ORAL_CAPSULE | Freq: Two times a day (BID) | ORAL | Status: DC
  Administered 2013-11-21 – 2013-11-26 (×11): 100 mg via ORAL
  Filled 2013-11-21 (×9): qty 1

## 2013-11-21 MED ORDER — NEOSTIGMINE METHYLSULFATE 10 MG/10ML IV SOLN
INTRAVENOUS | Status: DC | PRN
  Administered 2013-11-21: 3 mg via INTRAVENOUS

## 2013-11-21 MED ORDER — GLYCOPYRROLATE 0.2 MG/ML IJ SOLN
INTRAMUSCULAR | Status: AC
  Filled 2013-11-21: qty 2

## 2013-11-21 MED ORDER — LACTATED RINGERS IV SOLN
INTRAVENOUS | Status: DC | PRN
  Administered 2013-11-21 (×2): via INTRAVENOUS

## 2013-11-21 MED ORDER — LIDOCAINE HCL (CARDIAC) 20 MG/ML IV SOLN
INTRAVENOUS | Status: AC
  Filled 2013-11-21: qty 5

## 2013-11-21 MED ORDER — LACTATED RINGERS IV SOLN
INTRAVENOUS | Status: DC
  Administered 2013-11-21: 10:00:00 via INTRAVENOUS

## 2013-11-21 MED ORDER — BUPIVACAINE HCL (PF) 0.5 % IJ SOLN
INTRAMUSCULAR | Status: DC | PRN
  Administered 2013-11-21: 5 mL

## 2013-11-21 MED ORDER — OXYCODONE-ACETAMINOPHEN 5-325 MG PO TABS
1.0000 | ORAL_TABLET | ORAL | Status: DC | PRN
  Administered 2013-11-22 – 2013-11-25 (×7): 2 via ORAL
  Administered 2013-11-26: 1 via ORAL
  Administered 2013-11-26: 2 via ORAL
  Administered 2013-11-27: 1 via ORAL
  Administered 2013-11-27: 2 via ORAL
  Filled 2013-11-21 (×4): qty 2
  Filled 2013-11-21: qty 1
  Filled 2013-11-21: qty 2
  Filled 2013-11-21: qty 1
  Filled 2013-11-21 (×5): qty 2

## 2013-11-21 MED ORDER — PROPOFOL 10 MG/ML IV BOLUS
INTRAVENOUS | Status: AC
  Filled 2013-11-21: qty 20

## 2013-11-21 MED ORDER — MENTHOL 3 MG MT LOZG: 1.0000 | LOZENGE | OROMUCOSAL | Status: DC | PRN

## 2013-11-21 MED ORDER — POLYETHYLENE GLYCOL 3350 17 G PO PACK
17.0000 g | PACK | Freq: Every day | ORAL | Status: DC
  Administered 2013-11-21 – 2013-11-27 (×7): 17 g via ORAL
  Filled 2013-11-21 (×7): qty 1

## 2013-11-21 MED ORDER — PANTOPRAZOLE SODIUM 40 MG PO TBEC: 40.0000 mg | DELAYED_RELEASE_TABLET | Freq: Every evening | ORAL | Status: DC | PRN

## 2013-11-21 MED ORDER — FLEET ENEMA 7-19 GM/118ML RE ENEM: 1.0000 | ENEMA | Freq: Once | RECTAL | Status: AC | PRN

## 2013-11-21 MED ORDER — SENNOSIDES-DOCUSATE SODIUM 8.6-50 MG PO TABS: 1.0000 | ORAL_TABLET | Freq: Every evening | ORAL | Status: DC | PRN

## 2013-11-21 MED ORDER — HYDROMORPHONE HCL PF 1 MG/ML IJ SOLN
0.2500 mg | INTRAMUSCULAR | Status: DC | PRN
  Administered 2013-11-21 (×4): 0.5 mg via INTRAVENOUS
  Administered 2013-11-21: 18:00:00 via INTRAVENOUS

## 2013-11-21 MED ORDER — MIDAZOLAM HCL 2 MG/2ML IJ SOLN
INTRAMUSCULAR | Status: AC
  Filled 2013-11-21: qty 2

## 2013-11-21 MED ORDER — PROPOFOL 10 MG/ML IV BOLUS
INTRAVENOUS | Status: DC | PRN
  Administered 2013-11-21: 120 mg via INTRAVENOUS

## 2013-11-21 MED ORDER — SODIUM CHLORIDE 0.9 % IV SOLN
250.0000 mL | INTRAVENOUS | Status: DC
  Administered 2013-11-21: 250 mL via INTRAVENOUS

## 2013-11-21 MED ORDER — DIAZEPAM 5 MG PO TABS
5.0000 mg | ORAL_TABLET | Freq: Four times a day (QID) | ORAL | Status: DC | PRN
  Administered 2013-11-21 – 2013-11-22 (×3): 5 mg via ORAL
  Filled 2013-11-21 (×3): qty 1

## 2013-11-21 MED ORDER — ACETAMINOPHEN 10 MG/ML IV SOLN
INTRAVENOUS | Status: AC
  Administered 2013-11-21: 1000 mg via INTRAVENOUS
  Filled 2013-11-21: qty 100

## 2013-11-21 MED ORDER — GABAPENTIN 600 MG PO TABS: 600.0000 mg | ORAL_TABLET | Freq: Three times a day (TID) | ORAL | Status: DC

## 2013-11-21 MED ORDER — POLYETHYLENE GLYCOL 3350 17 GM/SCOOP PO POWD
17.0000 g | Freq: Every day | ORAL | Status: DC
  Filled 2013-11-21: qty 255

## 2013-11-21 MED ORDER — ZOLPIDEM TARTRATE 5 MG PO TABS
5.0000 mg | ORAL_TABLET | Freq: Every evening | ORAL | Status: DC | PRN
  Administered 2013-11-21 – 2013-11-27 (×3): 5 mg via ORAL
  Filled 2013-11-21 (×3): qty 1

## 2013-11-21 MED ORDER — OXYCODONE-ACETAMINOPHEN 5-325 MG PO TABS: 1.0000 | ORAL_TABLET | ORAL | Status: DC | PRN

## 2013-11-21 MED ORDER — LIDOCAINE HCL (CARDIAC) 20 MG/ML IV SOLN
INTRAVENOUS | Status: DC | PRN
  Administered 2013-11-21: 60 mg via INTRAVENOUS

## 2013-11-21 MED ORDER — PHENOL 1.4 % MT LIQD: 1.0000 | OROMUCOSAL | Status: DC | PRN

## 2013-11-21 MED ORDER — SODIUM CHLORIDE 0.9 % IJ SOLN: 3.0000 mL | INTRAMUSCULAR | Status: DC | PRN

## 2013-11-21 MED ORDER — OCUVITE-LUTEIN PO CAPS
1.0000 | ORAL_CAPSULE | Freq: Every day | ORAL | Status: DC
  Administered 2013-11-22 – 2013-11-25 (×4): 1 via ORAL
  Filled 2013-11-21 (×6): qty 1

## 2013-11-21 MED ORDER — 0.9 % SODIUM CHLORIDE (POUR BTL) OPTIME
TOPICAL | Status: DC | PRN
  Administered 2013-11-21: 1000 mL

## 2013-11-21 MED ORDER — HYDROCODONE-ACETAMINOPHEN 5-325 MG PO TABS
1.0000 | ORAL_TABLET | ORAL | Status: DC | PRN
  Administered 2013-11-23 – 2013-11-24 (×2): 2 via ORAL
  Filled 2013-11-21 (×2): qty 2

## 2013-11-21 MED ORDER — SODIUM CHLORIDE 0.9 % IJ SOLN
3.0000 mL | Freq: Two times a day (BID) | INTRAMUSCULAR | Status: DC
  Administered 2013-11-21 – 2013-11-26 (×9): 3 mL via INTRAVENOUS

## 2013-11-21 MED ORDER — ONDANSETRON HCL 4 MG/2ML IJ SOLN
4.0000 mg | INTRAMUSCULAR | Status: DC | PRN
  Administered 2013-11-22 (×2): 4 mg via INTRAVENOUS
  Filled 2013-11-21 (×2): qty 2

## 2013-11-21 MED ORDER — HYDROMORPHONE HCL PF 1 MG/ML IJ SOLN
INTRAMUSCULAR | Status: AC
  Filled 2013-11-21: qty 1

## 2013-11-21 SURGICAL SUPPLY — 86 items
ADH SKN CLS APL DERMABOND .7 (GAUZE/BANDAGES/DRESSINGS) ×1
APL SKNCLS STERI-STRIP NONHPOA (GAUZE/BANDAGES/DRESSINGS) ×1
BAG DECANTER FOR FLEXI CONT (MISCELLANEOUS) ×2 IMPLANT
BENZOIN TINCTURE PRP APPL 2/3 (GAUZE/BANDAGES/DRESSINGS) ×2 IMPLANT
BLADE 10 SAFETY STRL DISP (BLADE) ×1 IMPLANT
BLADE SURG ROTATE 9660 (MISCELLANEOUS) IMPLANT
BONE MATRIX OSTEOCEL PRO MED (Bone Implant) ×2 IMPLANT
BUR MATCHSTICK NEURO 3.0 LAGG (BURR) ×2 IMPLANT
BUR PRECISION FLUTE 5.0 (BURR) ×2 IMPLANT
CAGE PLIF 8X9X23-12 LUMBAR (Cage) ×2 IMPLANT
CANISTER SUCT 3000ML (MISCELLANEOUS) ×2 IMPLANT
CONT SPEC 4OZ CLIKSEAL STRL BL (MISCELLANEOUS) ×4 IMPLANT
COVER BACK TABLE 24X17X13 BIG (DRAPES) IMPLANT
COVER TABLE BACK 60X90 (DRAPES) ×2 IMPLANT
DERMABOND ADVANCED (GAUZE/BANDAGES/DRESSINGS) ×1
DERMABOND ADVANCED .7 DNX12 (GAUZE/BANDAGES/DRESSINGS) ×1 IMPLANT
DRAPE C-ARM 42X72 X-RAY (DRAPES) ×4 IMPLANT
DRAPE LAPAROTOMY 100X72X124 (DRAPES) ×2 IMPLANT
DRAPE POUCH INSTRU U-SHP 10X18 (DRAPES) ×2 IMPLANT
DRAPE SURG 17X23 STRL (DRAPES) ×2 IMPLANT
DRSG OPSITE 4X5.5 SM (GAUZE/BANDAGES/DRESSINGS) ×1 IMPLANT
DRSG OPSITE POSTOP 4X8 (GAUZE/BANDAGES/DRESSINGS) ×1 IMPLANT
DRSG TELFA 3X8 NADH (GAUZE/BANDAGES/DRESSINGS) ×2 IMPLANT
DURAPREP 26ML APPLICATOR (WOUND CARE) ×2 IMPLANT
ELECT REM PT RETURN 9FT ADLT (ELECTROSURGICAL) ×2
ELECTRODE REM PT RTRN 9FT ADLT (ELECTROSURGICAL) ×1 IMPLANT
EVACUATOR 1/8 PVC DRAIN (DRAIN) ×2 IMPLANT
GAUZE SPONGE 4X4 16PLY XRAY LF (GAUZE/BANDAGES/DRESSINGS) IMPLANT
GLOVE BIO SURGEON STRL SZ8 (GLOVE) ×5 IMPLANT
GLOVE BIOGEL PI IND STRL 7.0 (GLOVE) IMPLANT
GLOVE BIOGEL PI IND STRL 8 (GLOVE) ×2 IMPLANT
GLOVE BIOGEL PI IND STRL 8.5 (GLOVE) ×2 IMPLANT
GLOVE BIOGEL PI INDICATOR 7.0 (GLOVE) ×1
GLOVE BIOGEL PI INDICATOR 8 (GLOVE) ×2
GLOVE BIOGEL PI INDICATOR 8.5 (GLOVE) ×2
GLOVE ECLIPSE 8.0 STRL XLNG CF (GLOVE) ×4 IMPLANT
GLOVE EXAM NITRILE LRG STRL (GLOVE) IMPLANT
GLOVE EXAM NITRILE MD LF STRL (GLOVE) IMPLANT
GLOVE EXAM NITRILE XL STR (GLOVE) IMPLANT
GLOVE EXAM NITRILE XS STR PU (GLOVE) IMPLANT
GOWN STRL REUS W/ TWL LRG LVL3 (GOWN DISPOSABLE) IMPLANT
GOWN STRL REUS W/ TWL XL LVL3 (GOWN DISPOSABLE) ×2 IMPLANT
GOWN STRL REUS W/TWL 2XL LVL3 (GOWN DISPOSABLE) ×5 IMPLANT
GOWN STRL REUS W/TWL LRG LVL3 (GOWN DISPOSABLE)
GOWN STRL REUS W/TWL XL LVL3 (GOWN DISPOSABLE) ×6
KIT BASIN OR (CUSTOM PROCEDURE TRAY) ×2 IMPLANT
KIT POSITION SURG JACKSON T1 (MISCELLANEOUS) ×2 IMPLANT
KIT ROOM TURNOVER OR (KITS) ×2 IMPLANT
MILL MEDIUM DISP (BLADE) ×2 IMPLANT
MIX DBX 10CC 35% BONE (Bone Implant) ×1 IMPLANT
NDL HYPO 25X1 1.5 SAFETY (NEEDLE) ×1 IMPLANT
NDL SPNL 18GX3.5 QUINCKE PK (NEEDLE) IMPLANT
NEEDLE HYPO 25X1 1.5 SAFETY (NEEDLE) ×2 IMPLANT
NEEDLE SPNL 18GX3.5 QUINCKE PK (NEEDLE) IMPLANT
NS IRRIG 1000ML POUR BTL (IV SOLUTION) ×2 IMPLANT
PACK LAMINECTOMY NEURO (CUSTOM PROCEDURE TRAY) ×2 IMPLANT
PAD ARMBOARD 7.5X6 YLW CONV (MISCELLANEOUS) ×6 IMPLANT
PAD DRESSING TELFA 3X8 NADH (GAUZE/BANDAGES/DRESSINGS) ×1 IMPLANT
PATTIES SURGICAL .5 X.5 (GAUZE/BANDAGES/DRESSINGS) IMPLANT
PATTIES SURGICAL .5 X1 (DISPOSABLE) IMPLANT
PATTIES SURGICAL 1X1 (DISPOSABLE) IMPLANT
ROD PRE-BENT TI 85MM (Rod) ×1 IMPLANT
ROD PREBENT ARMADA 90MM SPINE (Rod) ×1 IMPLANT
SCREW 6.5X40MM (Screw) ×12 IMPLANT
SCREW BN 40X6.5XPA NS SPNE (Screw) IMPLANT
SCREW LOCK (Screw) ×16 IMPLANT
SCREW LOCK 100X5.5X OPN (Screw) IMPLANT
SCREW POLY 45X6.5 (Screw) IMPLANT
SCREW POLY 6.5X45MM (Screw) ×4 IMPLANT
SPONGE GAUZE 4X4 12PLY (GAUZE/BANDAGES/DRESSINGS) ×2 IMPLANT
SPONGE LAP 4X18 X RAY DECT (DISPOSABLE) IMPLANT
SPONGE SURGIFOAM ABS GEL 100 (HEMOSTASIS) ×2 IMPLANT
STAPLER SKIN PROX WIDE 3.9 (STAPLE) IMPLANT
STRIP CLOSURE SKIN 1/2X4 (GAUZE/BANDAGES/DRESSINGS) ×2 IMPLANT
SUT VIC AB 1 CT1 18XBRD ANBCTR (SUTURE) ×2 IMPLANT
SUT VIC AB 1 CT1 8-18 (SUTURE) ×2
SUT VIC AB 2-0 CT1 18 (SUTURE) ×4 IMPLANT
SUT VIC AB 3-0 SH 8-18 (SUTURE) ×4 IMPLANT
SYR 20ML ECCENTRIC (SYRINGE) ×2 IMPLANT
SYR 3ML LL SCALE MARK (SYRINGE) ×4 IMPLANT
SYR 5ML LL (SYRINGE) IMPLANT
TOWEL OR 17X24 6PK STRL BLUE (TOWEL DISPOSABLE) ×2 IMPLANT
TOWEL OR 17X26 10 PK STRL BLUE (TOWEL DISPOSABLE) ×2 IMPLANT
TRAP SPECIMEN MUCOUS 40CC (MISCELLANEOUS) ×2 IMPLANT
TRAY FOLEY CATH 14FRSI W/METER (CATHETERS) ×2 IMPLANT
WATER STERILE IRR 1000ML POUR (IV SOLUTION) ×2 IMPLANT

## 2013-11-21 NOTE — Progress Notes (Signed)
Awake, alert, conversant.  Says leg pain resolved.  Strength full both lower extremities.  Doing well.

## 2013-11-21 NOTE — Transfer of Care (Signed)
Immediate Anesthesia Transfer of Care Note  Patient: Stephanie Alvarado  Procedure(s) Performed: Procedure(s) with comments: POSTERIOR LUMBAR FUSION 1 LEVEL (N/A) - L3-4 Exploration and extension of fusion  Patient Location: PACU  Anesthesia Type:General  Level of Consciousness: sedated  Airway & Oxygen Therapy: Patient Spontanous Breathing and Patient connected to nasal cannula oxygen  Post-op Assessment: Report given to PACU RN and Post -op Vital signs reviewed and stable  Post vital signs: stable  Complications: No apparent anesthesia complications

## 2013-11-21 NOTE — Anesthesia Procedure Notes (Signed)
Procedure Name: Intubation Date/Time: 11/21/2013 1:24 PM Performed by: Izora Gala Pre-anesthesia Checklist: Patient identified, Emergency Drugs available, Suction available and Patient being monitored Patient Re-evaluated:Patient Re-evaluated prior to inductionOxygen Delivery Method: Circle system utilized Preoxygenation: Pre-oxygenation with 100% oxygen Intubation Type: IV induction Ventilation: Mask ventilation without difficulty Laryngoscope Size: Miller and 3 Grade View: Grade II Tube type: Oral Tube size: 7.5 mm Number of attempts: 1 Airway Equipment and Method: Stylet Placement Confirmation: ETT inserted through vocal cords under direct vision,  positive ETCO2 and breath sounds checked- equal and bilateral Secured at: 22 cm Dental Injury: Teeth and Oropharynx as per pre-operative assessment

## 2013-11-21 NOTE — Interval H&P Note (Signed)
History and Physical Interval Note:  11/21/2013 11:14 AM  Stephanie Alvarado  has presented today for surgery, with the diagnosis of Synovial cyst, lumbar hnp without myelopathy, Scoliosis, Lumbar radiculopathy  The various methods of treatment have been discussed with the patient and family. After consideration of risks, benefits and other options for treatment, the patient has consented to  Procedure(s) with comments: POSTERIOR LUMBAR FUSION 1 LEVEL (N/A) - L3-4 Exploration and extension of fusion as a surgical intervention .  The patient's history has been reviewed, patient examined, no change in status, stable for surgery.  I have reviewed the patient's chart and labs.  Questions were answered to the patient's satisfaction.     Kiante Petrovich D

## 2013-11-21 NOTE — Brief Op Note (Signed)
11/21/2013  5:13 PM  PATIENT:  Stephanie Alvarado  73 y.o. 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, Scoliosis, Lumbar radiculopathy L 34  PROCEDURE:  Procedure(s) with comments: POSTERIOR LUMBAR FUSION 1 LEVEL (N/A) - L3-4 Exploration and extension of fusion with PLIF with PEEK cages, autograft, allograft, revision of fusion with pedicle screw fixation L 3 - S 1 levels with posterolateral arthrodesis with autograft and allograft L 3 -  S 1 levels  SURGEON:  Surgeon(s) and Role:    * Erline Levine, MD - Primary  PHYSICIAN ASSISTANT:   ASSISTANTS: none   ANESTHESIA:   general  EBL:  Total I/O In: 2540 [I.V.:2250; Blood:290] Out: 925 [Urine:225; Blood:700]  BLOOD ADMINISTERED:290  CC PRBC  DRAINS: (Medium) Hemovact drain(s) in the epidural space with  Suction Open   LOCAL MEDICATIONS USED:  LIDOCAINE   SPECIMEN:  No Specimen  DISPOSITION OF SPECIMEN:  N/A  COUNTS:  YES  TOURNIQUET:  * No tourniquets in log *  DICTATION: Patient is 73 year old woman with lumbar scoliosis and previous fusion L 45 and L 5 S 1 levels with stenosis at  L 3/4 and a synovial cyst causing severe nerve compressionwith severe spinal stenosis and a history of severe back and left greater than right leg pain.  It was elected to take her to surgery for exploration of previous fusion with decompression and fusion from L 34 through L 5S1 levels.   Procedure: Patient was placed in a prone position on the [place] table after smooth and uncomplicated induction of general endotracheal anesthesia. Her low back was prepped and draped in usual sterile fashion with betadine scrub and DuraPrep. Area of incision was infiltrated with local lidocaine. Incision was made to the lumbodorsal fascia was incised and exposure was performed of the L 3 - S 1 spinous  processes laminae facet joint and transverse processes. Previous hardware was exposed.  This was removed the bone to expose the previously placed hardware. The bone scres were removed and while there appeared to be some bone, this did not appear to be confluent.  Intraoperative x-ray was obtained which confirmed correct orientation with marker probes at L3  Through L 4 levles. A total laminectomy of L 3 through L 4  levels was performed with disarticulation of the facet joints and thorough decompression was performed of both L 3  L 4  nerve roots along with the common dural tube. Decompression was greater than would be typical for PLIF. A Synovial cyst was carefully dissected from the nerve roots on the left and removed under Loupe magnification. A thorough discectomy  at L3/4  was performed. A thorough discectomy was then performed on the left with preparation of the endplates for grafting a trial spacer was placed this level and a thorough discectomy was performed on the right as well at L 34.  After trial sizing and utilization of sequential shavers, interspaces were packed with autograft, Osteocel Plus and 50mm  X 23 x 12 degree lordotic PEEK cages with 7 cc of autograft in the intrerspace.   There was good correction of scoliotic curvature. The posterolateral region was extensively decorticated and pedicle probes were placed at L3  through L4 bilaterally. Intraoperative fluoroscopy confirmed correct orientationin the AP and lateral plane. 40 x 6.5 mm pedicle screws were placed at L 4 bilaterally and 40 x 6.5 mm screws placed  at L 5 and S 1 bilaterally  Final x-rays demonstrated well-positioned interbody grafts and pedicle screw fixation. An 85 mm lordotic rod was placed on the right and a  95 mm rod was placed on the left locked down in situ and the posterolateral region was packed with 15 cc bone graft on the right and a similar amount on the left along with 10 cc DBX. The wound was irrigated. A medium Hemovac  drain was placed. Fascia was closed with 1 Vicryl sutures skin edges were reapproximated 2 and 3-0 Vicryl sutures. The wound was dressed with Dermabond and  an occlusive dressing the patient was extubated in the operating room and taken to recovery in stable satisfactory condition she tolerated traction well counts were correct at the end of the case.   PLAN OF CARE: Admit to inpatient   PATIENT DISPOSITION:  PACU - hemodynamically stable.   Delay start of Pharmacological VTE agent (>24hrs) due to surgical blood loss or risk of bleeding: yes

## 2013-11-21 NOTE — Op Note (Signed)
11/21/2013  5:13 PM  PATIENT:  Stephanie Alvarado  73 y.o. 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, Scoliosis, Lumbar radiculopathy L 34  PROCEDURE:  Procedure(s) with comments: POSTERIOR LUMBAR FUSION 1 LEVEL (N/A) - L3-4 Exploration and extension of fusion with PLIF with PEEK cages, autograft, allograft, revision of fusion with pedicle screw fixation L 3 - S 1 levels with posterolateral arthrodesis with autograft and allograft L 3 -  S 1 levels  SURGEON:  Surgeon(s) and Role:    * Erline Levine, MD - Primary  PHYSICIAN ASSISTANT:   ASSISTANTS: none   ANESTHESIA:   general  EBL:  Total I/O In: 2540 [I.V.:2250; Blood:290] Out: 925 [Urine:225; Blood:700]  BLOOD ADMINISTERED:290  CC PRBC  DRAINS: (Medium) Hemovact drain(s) in the epidural space with  Suction Open   LOCAL MEDICATIONS USED:  LIDOCAINE   SPECIMEN:  No Specimen  DISPOSITION OF SPECIMEN:  N/A  COUNTS:  YES  TOURNIQUET:  * No tourniquets in log *  DICTATION: Patient is 73 year old woman with lumbar scoliosis and previous fusion L 45 and L 5 S 1 levels with stenosis at  L 3/4 and a synovial cyst causing severe nerve compressionwith severe spinal stenosis and a history of severe back and left greater than right leg pain.  It was elected to take her to surgery for exploration of previous fusion with decompression and fusion from L 34 through L 5S1 levels.   Procedure: Patient was placed in a prone position on the [place] table after smooth and uncomplicated induction of general endotracheal anesthesia. Her low back was prepped and draped in usual sterile fashion with betadine scrub and DuraPrep. Area of incision was infiltrated with local lidocaine. Incision was made to the lumbodorsal fascia was incised and exposure was performed of the L 3 - S 1 spinous  processes laminae facet joint and transverse processes. Previous hardware was exposed.  This was removed the bone to expose the previously placed hardware. The bone scres were removed and while there appeared to be some bone, this did not appear to be confluent.  Intraoperative x-ray was obtained which confirmed correct orientation with marker probes at L3  Through L 4 levles. A total laminectomy of L 3 through L 4  levels was performed with disarticulation of the facet joints and thorough decompression was performed of both L 3  L 4  nerve roots along with the common dural tube. Decompression was greater than would be typical for PLIF. A Synovial cyst was carefully dissected from the nerve roots on the left and removed under Loupe magnification. A thorough discectomy  at L3/4  was performed. A thorough discectomy was then performed on the left with preparation of the endplates for grafting a trial spacer was placed this level and a thorough discectomy was performed on the right as well at L 34.  After trial sizing and utilization of sequential shavers, interspaces were packed with autograft, Osteocel Plus and 70mm  X 23 x 12 degree lordotic PEEK cages with 7 cc of autograft in the intrerspace.   There was good correction of scoliotic curvature. The posterolateral region was extensively decorticated and pedicle probes were placed at L3  through L4 bilaterally. Intraoperative fluoroscopy confirmed correct orientationin the AP and lateral plane. 40 x 6.5 mm pedicle screws were placed at L 4 bilaterally and 40 x 6.5 mm screws placed  at L 5 and S 1 bilaterally  Final x-rays demonstrated well-positioned interbody grafts and pedicle screw fixation. An 85 mm lordotic rod was placed on the right and a  95 mm rod was placed on the left locked down in situ and the posterolateral region was packed with 15 cc bone graft on the right and a similar amount on the left along with 10 cc DBX. The wound was irrigated. A medium Hemovac  drain was placed. Fascia was closed with 1 Vicryl sutures skin edges were reapproximated 2 and 3-0 Vicryl sutures. The wound was dressed with Dermabond and  an occlusive dressing the patient was extubated in the operating room and taken to recovery in stable satisfactory condition she tolerated traction well counts were correct at the end of the case.   PLAN OF CARE: Admit to inpatient   PATIENT DISPOSITION:  PACU - hemodynamically stable.   Delay start of Pharmacological VTE agent (>24hrs) due to surgical blood loss or risk of bleeding: yes

## 2013-11-21 NOTE — H&P (Signed)
[place], [place] [address] Phone: [phone]   Patient ID:   [id number] Patient: Stephanie Alvarado  Date of Birth: Jul 18, 1940 Visit Type: Office Visit   Date: 10/23/2013 08:30 AM Provider: Marchia Meiers. Vertell Limber MD   This 73 year old female presents for back pain and Leg pain.  History of Present Illness: 1.  back pain  2.  Leg pain  [facility] [place] for MRI & insurance card) returns reporting left >right buttock & leg pain since November 2014, increasing in severity.  Steroid helped only minimally. ESI's x2 offered no relief.   Vicodin -no help Plavix stopped two days ago in hopes of tx  Recent Hx: Stroke 2010, Heart Stents 2007, Lumbar fusion ?date ([facility]) Dr. Ron Parker (cardiologist) saw pt in March.   MRI on [place]  Patient is currently describing excruciating left buttock pain and left leg pain.  She says that the pain recurred after November 2014.  She said steroids from the emergency room only helped her to a limited degree and 2 epidural steroid injections by Dr. Francesco Runner did not give her any significant relief.  She has been on Plavix but stopped it in case she would need surgery.  She has had a stroke in 2010 and heart stents in 2007 and 2008.  I performed a lumbar fusion in December 2004.  She says since that time she's gained 30 pounds.  Dr. Dola Argyle is her cardiologist.  She has not been cleared for surgery.  Currently the patient complains of left leg pain to the outside of her left leg and so she cannot walk.  She notes mild right leg pain.  Lumbar radiographs were reviewed which show degenerative changes at multiple levels of the thoracolumbar spine with questionable arthrodesis at the L4 L5 level with what appears to be a solid arthrodesis at the L5-S1 level.  MRI review demonstrates a cyst at L3 L4 along with severe spondylosis stenosis and scoliosis with left-sided nerve root compression due to facet cyst at this level.         PAST MEDICAL/SURGICAL HISTORY   (Detailed)      PAST MEDICAL HISTORY, SURGICAL HISTORY, FAMILY HISTORY, SOCIAL HISTORY AND REVIEW OF SYSTEMS I have reviewed the patient's past medical, surgical, family and social history as well as the comprehensive review of systems as included on the [facility] history form dated, which I have signed.  Family History  (Detailed)  SOCIAL HISTORY  (Detailed) Tobacco use reviewed. Preferred language is Unknown.   Smoking status: Former smoker.  SMOKING STATUS Use Status Type Smoking Status Usage Per Day Years Used Total Pack Years  yes  Former smoker       HOME ENVIRONMENT/SAFETY The patient has not fallen in the last year.        MEDICATIONS(added, continued or stopped this visit):   Started Medication Directions Instruction Stopped  10/23/2013 Percocet 5 mg-325 mg tablet 1 po q 4-6 hrs prn pain    10/23/2013 tizanidine 2 mg tablet 1 po up to TID prn spasm      ALLERGIES:  Ingredient Reaction Medication Name Comment  CODEINE Itching    Reviewed, updated.  REVIEW OF SYSTEMS System Neg/Pos Details  Constitutional Negative Chills, fatigue, fever, malaise, night sweats, weight gain and weight loss.  ENMT Negative Ear drainage, hearing loss, nasal drainage, otalgia, sinus pressure and sore throat.  Eyes Negative Eye discharge, eye pain and vision changes.  Respiratory Negative Chronic  cough, cough, dyspnea, known TB exposure and wheezing.  Cardio Negative Chest pain, claudication, edema and irregular heartbeat/palpitations.  GI Negative Abdominal pain, blood in stool, change in stool pattern, constipation, decreased appetite, diarrhea, heartburn, nausea and vomiting.  GU Negative Dysuria, hematuria, hot flashes, irregular menses, polyuria, urinary frequency, urinary incontinence and urinary retention.  Endocrine Negative Cold intolerance, heat intolerance, polydipsia and polyphagia.  Neuro  Negative Dizziness, extremity weakness, gait disturbance, headache, memory impairment, numbness in extremity, seizures and tremors.  Psych Negative Anxiety, depression and insomnia.  Integumentary Negative Brittle hair, brittle nails, change in shape/size of mole(s), hair loss, hirsutism, hives, pruritus, rash and skin lesion.  MS Positive Back pain, LLE>RLE pain.  Hema/Lymph Negative Easy bleeding, easy bruising and lymphadenopathy.  Allergic/Immuno Negative Contact allergy, environmental allergies, food allergies and seasonal allergies.  Reproductive Negative Breast discharge, breast lump(s), dysmenorrhea, dyspareunia, history of abnormal PAP smear and vaginal discharge.    Vitals Date Temp F BP Pulse Ht In Wt Lb BMI BSA Pain Score  10/23/2013  146/72 60 68 189 28.74  10/10     PHYSICAL EXAM General Level of Distress: no acute distress Overall Appearance: normal  Head and Face  Right Left  Fundoscopic Exam:  normal normal    Cardiovascular Cardiac: regular rate and rhythm without murmur  Respiratory Lungs: clear to auscultation  Neurological Recent and Remote Memory: normal Attention Span and Concentration:   normal Language: normal Fund of Knowledge: normal  Right Left Sensation: normal normal Upper Extremity Coordination: normal normal  Lower Extremity Coordination: normal normal  Musculoskeletal Gait and Station: normal  Right Left Upper Extremity Muscle Strength: normal normal Lower Extremity Muscle Strength: normal normal Upper Extremity Muscle Tone:  normal normal Lower Extremity Muscle Tone: normal normal  Motor Strength Upper and lower extremity motor strength was tested in the clinically pertinent muscles. Any abnormal findings will be noted below.   Right Left Knee Extensor:  4/5   Deep Tendon  Reflexes  Right Left Biceps: normal normal Triceps: normal normal Brachiloradialis: normal normal Patellar: normal normal Achilles: normal normal  Sensory Sensation was tested at L1 to S1.   Cranial Nerves II. Optic Nerve/Visual Fields: normal III. Oculomotor: normal IV. Trochlear: normal V. Trigeminal: normal VI. Abducens: normal VII. Facial: normal VIII. Acoustic/Vestibular: normal IX. Glossopharyngeal: normal X. Vagus: normal XI. Spinal Accessory: normal XII. Hypoglossal: normal  Motor and other Tests Lhermittes: negative Rhomberg: negative    Right Left Hoffman's: normal normal Clonus: normal normal Babinski: normal normal SLR: negative negative Patrick's Corky Sox): negative negative Toe Walk: normal normal Toe Lift: normal normal Heel Walk: normal normal SI Joint: nontender nontender   Additional Findings:  Patient has left greater than right sciatic notch discomfort and a markedly positive straight leg raise on the left.  She is able to stand on her heels and toes but is decreased ability to squat on her left lower extremity.      IMPRESSION Patient is getting worse since November 2014.  I have recommended that the patient undergo exploration of fusion with extension of fusion to L3 L4 level with removal of synovial cyst and treatment of severe spinal stenosis at this level.  She will need clearance by Dr. Ron Parker for surgery.  She was fitted for LSO brace.  She was given prescriptions today for Percocet and methocarbamol.  Completed Orders (this encounter) Order Details Reason Side Interpretation Result Initial Treatment Date Region  Lifestyle education regarding diet Encouraged to eat a well balanced diet and follow up with  primary care physician.        Hypertension education Follow up with primary care physician.        Lumbar Spine- AP/Lat/Flex/Ex      10/23/2013    Assessment/Plan # Detail Type Description   1. 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       2. Assessment Lumbar scoliosis (737.30).       3. 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       4. Assessment Lumbar radiculopathy (724.4).       5. Assessment BMI 28.0-28.9,ADULT (V85.24).   Plan Orders Today's instructions / counseling include(s) Lifestyle education regarding diet.       6. Assessment Hypertension, Unspecified (401.9).         Pain Assessment/Treatment Pain Scale: 10/10. Method: Numeric Pain Intensity Scale. Location: back/legs. Onset: 08/23/2013. Duration: varies. Quality: throbbing. Pain Assessment/Treatment follow-up plan of care: Patient currently taking pain medication as prescribed..  Fall Risk Plan The patient has not fallen in the last year.  Plan is for exploration of fusion and decompression and fusion L3 L4 level on clearance from Dr. Ron Parker for surgery.  Patient will need to be off Plavix prior to surgery.  Orders: Office Procedures/Services: Assessment Service Comments   Dr. Ron Parker Cardiology Clearance    Diagnostic Procedures: Assessment Procedure   Lumbar Spine- AP/Lat/Flex/Ex  727.40 Exploration and Extension of Fusion - L3-L4  Instruction(s)/Education: Assessment Instruction  401.9 Hypertension education  V85.24 Lifestyle education regarding diet    MEDICATIONS PRESCRIBED TODAY    Rx Quantity Refills  TIZANIDINE HCL 2 mg  90 0  PERCOCET 5 mg-325 mg  90 0            Provider:  Marchia Meiers. Vertell Limber MD  10/27/2013 05:17 PM Dictation edited by: Marchia Meiers. Vertell Limber    CC Providers: Erline Levine MD [address] [place], [place] [address] ----------------------------------------------------------------------------------------------------------------------------------------------------------------------         Electronically signed by Marchia Meiers. Vertell Limber MD on 10/27/2013 05:17 PM

## 2013-11-21 NOTE — Anesthesia Preprocedure Evaluation (Addendum)
Anesthesia Evaluation  Patient identified by MRN, date of birth, ID band Patient awake    Reviewed: Allergy & Precautions, H&P , NPO status , Patient's Chart, lab work & pertinent test results  Airway Mallampati: II TM Distance: >3 FB Neck ROM: Full    Dental no notable dental hx.    Pulmonary neg pulmonary ROS, sleep apnea , former smoker,  breath sounds clear to auscultation  Pulmonary exam normal       Cardiovascular hypertension, Pt. on medications + CAD, + Cardiac Stents and + Peripheral Vascular Disease negative cardio ROS  Rhythm:Regular Rate:Normal     Neuro/Psych CVA negative neurological ROS  negative psych ROS   GI/Hepatic Neg liver ROS, GERD-  ,  Endo/Other  negative endocrine ROS  Renal/GU negative Renal ROS  negative genitourinary   Musculoskeletal negative musculoskeletal ROS (+)   Abdominal   Peds negative pediatric ROS (+)  Hematology negative hematology ROS (+)   Anesthesia Other Findings   Reproductive/Obstetrics negative OB ROS                           Anesthesia Physical Anesthesia Plan  ASA: III  Anesthesia Plan: General   Post-op Pain Management:    Induction: Intravenous  Airway Management Planned: Oral ETT  Additional Equipment:   Intra-op Plan:   Post-operative Plan: Extubation in OR  Informed Consent: I have reviewed the patients History and Physical, chart, labs and discussed the procedure including the risks, benefits and alternatives for the proposed anesthesia with the patient or authorized representative who has indicated his/her understanding and acceptance.   Dental advisory given  Plan Discussed with: CRNA and Surgeon  Anesthesia Plan Comments:        Anesthesia Quick Evaluation

## 2013-11-21 NOTE — Anesthesia Postprocedure Evaluation (Signed)
  Anesthesia Post-op Note  Patient: Stephanie Alvarado  Procedure(s) Performed: Procedure(s) with comments: POSTERIOR LUMBAR FUSION 1 LEVEL (N/A) - L3-4 Exploration and extension of fusion  Patient Location: PACU  Anesthesia Type:General  Level of Consciousness: awake  Airway and Oxygen Therapy: Patient Spontanous Breathing  Post-op Pain: mild  Post-op Assessment: Post-op Vital signs reviewed  Post-op Vital Signs: Reviewed  Last Vitals:  Filed Vitals:   11/21/13 1745  BP: 176/82  Pulse: 64  Temp:   Resp: 15    Complications: No apparent anesthesia complications

## 2013-11-22 MED ORDER — HYDROXYZINE HCL 25 MG PO TABS
50.0000 mg | ORAL_TABLET | ORAL | Status: DC | PRN
  Administered 2013-11-22 – 2013-11-25 (×8): 50 mg via ORAL
  Filled 2013-11-22 (×8): qty 2

## 2013-11-22 MED ORDER — PROMETHAZINE HCL 25 MG PO TABS: 25.0000 mg | ORAL_TABLET | Freq: Four times a day (QID) | ORAL | Status: DC | PRN

## 2013-11-22 MED ORDER — PROMETHAZINE HCL 25 MG RE SUPP: 25.0000 mg | Freq: Four times a day (QID) | RECTAL | Status: DC | PRN

## 2013-11-22 MED ORDER — PROMETHAZINE HCL 25 MG/ML IJ SOLN: 12.5000 mg | Freq: Four times a day (QID) | INTRAMUSCULAR | Status: DC | PRN

## 2013-11-22 NOTE — Progress Notes (Signed)
Subjective: Patient reports "I'm sore in my back...no pain in my legs like before"  Objective: Vital signs in last 24 hours: Temp:  [97.6 F (36.4 C)-98.6 F (37 C)] 97.7 F (36.5 C) (06/12 0639) Pulse Rate:  [43-91] 43 (06/12 0639) Resp:  [15-20] 18 (06/12 0639) BP: (97-176)/(59-82) 97/60 mmHg (06/12 0639) SpO2:  [95 %-100 %] 95 % (06/12 0639) Weight:  [85.276 kg (188 lb)] 85.276 kg (188 lb) (06/11 0935)  Intake/Output from previous day: 06/11 0701 - 06/12 0700 In: 3043 [I.V.:2753; Blood:290] Out: 2905 [BULAG:5364; Drains:280; Blood:700] Intake/Output this shift:    Alert, conversant. Reports poor sleep through the night d/t lumbar discomfort & one episode nausea & vomiting this AM.  Now reports only lubar soreness. Drsg intact, dry, Hemovac patent, secure. No erythema, swelling, or drainage. Good strength BLE.  Lab Results: No results found for this basename: WBC, HGB, HCT, PLT,  in the last 72 hours BMET No results found for this basename: NA, K, CL, CO2, GLUCOSE, BUN, CREATININE, CALCIUM,  in the last 72 hours  Studies/Results: Dg Lumbar Spine 2-3 Views  11/21/2013   CLINICAL DATA:  PLIF L3-4.  EXAM: DG C-ARM 61-120 MIN; LUMBAR SPINE - 2-3 VIEW  TECHNIQUE: Three spot intraoperative images over the lumbosacral spine.  CONTRAST:  None  FLUOROSCOPY TIME:  0 min 30 seconds.  COMPARISON:  11/21/2013 in 10/23/2013  FINDINGS: The examination demonstrates posterior fusion hardware/pedicle screws from the L3 to the S1 level with intervertebral cage at the L3-4 level. 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 There is minimal loss of anterior vertebral body height of L4 unchanged. Recommend correlation with findings at the time of the procedure.  IMPRESSION: Posterior fusion hardware intact from the L3-4 to the L5-S1 level. Intervertebral cage at the L3-4 level.   Electronically Signed   By: Marin Olp M.D.   On: 11/21/2013 17:31   Dg Lumbar Spine 1 View  11/21/2013   CLINICAL  DATA:  PLIF  EXAM: LUMBAR SPINE - 1 VIEW  COMPARISON:  Outside film from [facility] dated Oct 25, 2013  FINDINGS: The numbering of the previous posterior fusion levels is presumed to be L4-L5 and L5-S1. The uppermost trocar lies over the spinous process of L3. The lower trocar overlies the midbody of L4.  IMPRESSION: The localization devices are present as described.   Electronically Signed   By: David  Martinique   On: 11/21/2013 16:33   Dg C-arm 1-60 Min  11/21/2013   CLINICAL DATA:  PLIF L3-4.  EXAM: DG C-ARM 61-120 MIN; LUMBAR SPINE - 2-3 VIEW  TECHNIQUE: Three spot intraoperative images over the lumbosacral spine.  CONTRAST:  None  FLUOROSCOPY TIME:  0 min 30 seconds.  COMPARISON:  11/21/2013 in 10/23/2013  FINDINGS: The examination demonstrates posterior fusion hardware/pedicle screws from the L3 to the S1 level with intervertebral cage at the L3-4 level. 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 There is minimal loss of anterior vertebral body height of L4 unchanged. Recommend correlation with findings at the time of the procedure.  IMPRESSION: Posterior fusion hardware intact from the L3-4 to the L5-S1 level. Intervertebral cage at the L3-4 level.   Electronically Signed   By: Marin Olp M.D.   On: 11/21/2013 17:31    Assessment/Plan: Improving   LOS: 1 day  Mobilize in LSO with PT.   Verdis Prime 11/22/2013, 7:54 AM

## 2013-11-22 NOTE — Progress Notes (Signed)
Patient is progressing nicely following surgery.

## 2013-11-22 NOTE — Progress Notes (Signed)
Utilization review completed. Lucian Baswell, RN, BSN. 

## 2013-11-22 NOTE — Clinical Social Work Psychosocial (Signed)
Clinical Social Work Department BRIEF PSYCHOSOCIAL ASSESSMENT 11/22/2013  Patient:  Stephanie Alvarado, Stephanie Alvarado     Account Number:  [id number]     Admit date:  11/21/2013  Clinical Social Worker:  Delrae Sawyers  Date/Time:  11/22/2013 03:29 PM  Referred by:  Physician  Date Referred:  11/22/2013 Referred for  SNF Placement   Other Referral:   none.   Interview type:  Patient Other interview type:   Pt's daughter, Stephanie Alvarado, also present at bedside.    PSYCHOSOCIAL DATA Living Status:  ALONE Admitted from facility:   Level of care:   Primary support name:  Stephanie Alvarado Primary support relationship to patient:  CHILD, ADULT Degree of support available:   Strong support system.    CURRENT CONCERNS Current Concerns  Post-Acute Placement   Other Concerns:   none.    SOCIAL WORK ASSESSMENT / PLAN CSW received consult for SNF placement at time of discharge. CSW met with pt and pt's daughter, Stephanie Alvarado, at bedside. Pt informed CSW that pt is from home alone and is unable to care for self right now. Pt stated she would prefer to be placed in Stephanie Alvar[place]/Stephanie [place]var[place] area in order to be closer to pt's children. Pt informed CSW pt lost her husband 3 years ago and her mother 1 year ago. CSW offered support.    CSW to continue to follow and assist with discharge planning needs.   Assessment/plan status:  Psychosocial Support/Ongoing Assessment of Needs Other assessment/ plan:   none.   Information/referral to community resources:   [facility] bed offers.    PATIENT'S/FAMILY'S RESPONSE TO PLAN OF CARE: Pt understanding and agreeable to CSW plan of care. Pt expressed no further questions or concerns at this time.       Stephanie Alvarado, MSW, [facility] Licensed Clinical Social Worker [phone] and [phone]

## 2013-11-22 NOTE — Progress Notes (Signed)
Pt arrived on unit with itching on face and feet. Nose is excoriated and red from itching. Pt states this has happened before when taking pain medications, but not this severe before. MD notified. Benadryl given and symptoms currently subsided.

## 2013-11-22 NOTE — Evaluation (Signed)
Occupational Therapy Evaluation Patient Details Name: Stephanie Alvarado MRN: [id number] DOB: 27-Jul-1940 Today's Date: 11/22/2013    History of Present Illness 73 y.o. female admitted to [facility] on 11/21/13 s/p elective L3/4 PLIF.  She has significant PMHx of lumbar disc surgery x 3, stroke, MVP, and anxiety.     Clinical Impression   Pt admitted with above. She demonstrates the below listed deficits and will benefit from continued OT to maximize safety and independence with BADLs.  Pt currently requires max A for BADLs and min A for functional mobility.  She does not have 24 hour assistance at discharge and therefore the recommendation is that she go to SNF for further rehab to allow her to return home modified independently.      Follow Up Recommendations  SNF    Equipment Recommendations  None recommended by OT    Recommendations for Other Services       Precautions / Restrictions Precautions Precautions: Fall Precaution Comments: due to post op status      Mobility Bed Mobility Overal bed mobility: Needs Assistance Bed Mobility: Rolling;Sidelying to Sit;Sit to Sidelying Rolling: Supervision Sidelying to sit: Min assist     Sit to sidelying: Min assist General bed mobility comments: assist for LEs and mod verbal cues for technique  Transfers Overall transfer level: Needs assistance Equipment used: Rolling walker (2 wheeled) Transfers: Sit to/from [facility] Sit to Stand: Min assist Stand pivot transfers: Min assist       General transfer comment: Min A to move into standing.  Verbal cues for hand placement and safety     Balance Overall balance assessment: Needs assistance Sitting-balance support: Feet supported Sitting balance-Leahy Scale: Good     Standing balance support: Bilateral upper extremity supported Standing balance-Leahy Scale: Poor                              ADL Overall ADL's : Needs assistance/impaired Eating/Feeding:  Independent   Grooming: Wash/dry hands;Wash/dry face;Oral care;Brushing hair;Minimal assistance;Standing   Upper Body Bathing: Supervision/ safety;Sitting   Lower Body Bathing: Maximal assistance;Sit to/from stand   Upper Body Dressing : Minimal assistance;Sitting   Lower Body Dressing: Total assistance;Sit to/from stand   Toilet Transfer: Minimal assistance;Ambulation;Comfort height toilet;Grab bars;BSC   Toileting- Clothing Manipulation and Hygiene: Moderate assistance;Sit to/from stand       Functional mobility during ADLs: Minimal assistance;Rolling walker General ADL Comments: Pt requires mod verbal cues for back precautions.  She is unable to access feet by crossing ankles over knees and was unable to do so PTA - will benefit from use of AE.  Reviewed back precautions and safety with BADLs     Vision                     Perception     Praxis      Pertinent Vitals/Pain See vitals flow sheet     Hand Dominance     Extremity/Trunk Assessment Upper Extremity Assessment Upper Extremity Assessment: Overall WFL for tasks assessed   Lower Extremity Assessment Lower Extremity Assessment: Defer to PT evaluation   Cervical / Trunk Assessment Cervical / Trunk Assessment: Other exceptions Cervical / Trunk Exceptions: h/o lumbar disc surgery   Communication Communication Communication: No difficulties   Cognition Arousal/Alertness: Awake/alert Behavior During Therapy: WFL for tasks assessed/performed Overall Cognitive Status: Within Functional Limits for tasks assessed  General Comments       Exercises       Shoulder Instructions      Home Living Family/patient expects to be discharged to:: Skilled nursing facility Living Arrangements: Alone Available Help at Discharge: Family;Available PRN/intermittently Type of Home: House Home Access: Stairs to enter [employer] of Steps: 1 Entrance Stairs-Rails: None Home  Layout: One level;Other (Comment) (with guest room upstairs, but doesn't have to go )               Home Equipment: Walker - 2 wheels          Prior Functioning/Environment Level of Independence: Independent with assistive device(s)        Comments: used walker for a few months now.  Pt indicates she struggled with performing BADLs    OT Diagnosis: Generalized weakness;Acute pain   OT Problem List: Decreased strength;Impaired balance (sitting and/or standing);Decreased safety awareness;Decreased knowledge of use of DME or AE;Decreased knowledge of precautions;Obesity;Pain   OT Treatment/Interventions: Self-care/ADL training;DME and/or AE instruction;Therapeutic activities;Patient/family education    OT Goals(Current goals can be found in the care plan section) Acute Rehab OT Goals Patient Stated Goal: To go to rehab and regain independence OT Goal Formulation: With patient Time For Goal Achievement: 11/29/13 Potential to Achieve Goals: Good ADL Goals Pt Will Perform Grooming: with supervision;standing Pt Will Perform Lower Body Bathing: with supervision;with adaptive equipment;sit to/from stand Pt Will Perform Lower Body Dressing: with supervision;with adaptive equipment;sit to/from stand Pt Will Transfer to Toilet: with supervision;ambulating;bedside commode;grab bars Pt Will Perform Toileting - Clothing Manipulation and hygiene: with supervision;sit to/from stand Additional ADL Goal #1: Pt will be independent with back precautions during all ADLs and functional mobility   OT Frequency: Min 2X/week   Barriers to D/C: Decreased caregiver support          Co-evaluation              End of Session Equipment Utilized During Treatment: Rolling walker;Back brace Nurse Communication: Mobility status  Activity Tolerance: Patient tolerated treatment well Patient left: in bed;with call bell/phone within reach;with bed alarm set;with family/visitor present   Time:  0240-9735 OT Time Calculation (min): 35 min Charges:  OT General Charges $OT Visit: 1 Procedure OT Evaluation $Initial OT Evaluation Tier I: 1 Procedure OT Treatments $Self Care/Home Management : 8-22 mins $Therapeutic Activity: 8-22 mins G-Codes:    Stephanie Alvarado M 12-22-13, 6:16 PM

## 2013-11-22 NOTE — Evaluation (Signed)
Physical Therapy Evaluation Patient Details Name: Stephanie Alvarado MRN: [id number] DOB: 27-Feb-1941 Today's Date: 11/22/2013   History of Present Illness  73 y.o. female admitted to [facility] on 11/21/13 s/p elective L3/4 PLIF.  She has significant PMHx of lumbar disc surgery x 3, stroke, MVP, and anxiety.    Clinical Impression  Pt is POD #1 s/p lumbar fusion surgery.  She is generally weak on her feet requiring a RW and physical support during gait.  She would benefit from SNF for rehab before going home by herself as she would be unable to take care of herself at this time and would be at risk of falling or breaking her back precautions.   PT to follow acutely for deficits listed below.       Follow Up Recommendations SNF (due to not having much help at home)    Equipment Recommendations  None recommended by PT    Recommendations for Other Services   NA    Precautions / Restrictions Precautions Precautions: Fall Precaution Comments: due to post op status      Mobility   Transfers Overall transfer level: Needs assistance Equipment used: Rolling walker (2 wheeled) Transfers: Sit to/from Stand Sit to Stand: Min assist         General transfer comment: Min assist to support trunk during transitions from sit to stand.  Verbal cues for safe hand plaement and RW use.  Pt coming up slowly over flexed knees.   Ambulation/Gait Ambulation/Gait assistance: Min assist Ambulation Distance (Feet): 100 Feet (50', seated rest break, 50') Assistive device: Rolling walker (2 wheeled) Gait Pattern/deviations: Step-through pattern;Shuffle;Trunk flexed Gait velocity: decreased Gait velocity interpretation: Below normal speed for age/gender General Gait Details: Pt with flexed knees during gait, better with increased distance and cueing to stand tall and straighten legs.  Verbal cues for safe RW use.           Balance Overall balance assessment: Needs assistance Sitting-balance support: Feet  supported;No upper extremity supported Sitting balance-Leahy Scale: Good     Standing balance support: Bilateral upper extremity supported Standing balance-Leahy Scale: Poor                               Pertinent Vitals/Pain See vitals flow sheet.     Home Living Family/patient expects to be discharged to:: Private residence Living Arrangements: Alone Available Help at Discharge: Family;Available PRN/intermittently Type of Home: House Home Access: Stairs to enter Entrance Stairs-Rails: None Entrance Stairs-Number of Steps: 1 Home Layout: One level;Other (Comment) (with guest room upstairs, but doesn't have to go ) Home Equipment: Walker - 2 wheels      Prior Function Level of Independence: Independent with assistive device(s)         Comments: used walker for a few months now        Extremity/Trunk Assessment   Upper Extremity Assessment: Defer to OT evaluation           Lower Extremity Assessment: Generalized weakness (bil legs flexed with gait and static standing, at least 3/5)      Cervical / Trunk Assessment: Other exceptions  Communication   Communication: No difficulties  Cognition Arousal/Alertness: Awake/alert Behavior During Therapy: WFL for tasks assessed/performed Overall Cognitive Status: Within Functional Limits for tasks assessed  Assessment/Plan    PT Assessment Patient needs continued PT services  PT Diagnosis Abnormality of gait;Difficulty walking;Generalized weakness;Acute pain   PT Problem List Decreased strength;Decreased activity tolerance;Decreased balance;Decreased mobility;Decreased knowledge of use of DME;Decreased knowledge of precautions;Pain  PT Treatment Interventions DME instruction;Gait training;Functional mobility training;Therapeutic activities;Therapeutic exercise;Balance training;Neuromuscular re-education;Patient/family education;Modalities   PT Goals (Current goals  can be found in the Care Plan section) Acute Rehab PT Goals Patient Stated Goal: to go to rehab center before going home alone PT Goal Formulation: With patient Time For Goal Achievement: 11/29/13 Potential to Achieve Goals: Good    Frequency Min 5X/week   Barriers to discharge Decreased caregiver support pt lives alone and does not have anyone who can stay with her at d/c       End of Session Equipment Utilized During Treatment: Back brace Activity Tolerance: Patient limited by fatigue;Patient limited by pain Patient left: in chair;with call bell/phone within reach           Time: 1106-1126 PT Time Calculation (min): 20 min   Charges:   PT Evaluation $Initial PT Evaluation Tier I: 1 Procedure PT Treatments $Gait Training: 8-22 mins        Sherley Leser B. [place], [place], DPT [phone]   11/22/2013, 4:05 PM

## 2013-11-23 MED ORDER — PANTOPRAZOLE SODIUM 40 MG PO TBEC
40.0000 mg | DELAYED_RELEASE_TABLET | Freq: Every day | ORAL | Status: DC
  Administered 2013-11-23 – 2013-11-26 (×4): 40 mg via ORAL
  Filled 2013-11-23 (×3): qty 1

## 2013-11-23 NOTE — Progress Notes (Signed)
Physical Therapy Treatment Patient Details Name: Stephanie Alvarado MRN: [id number] DOB: December 29, 1940 Today's Date: 11/23/2013    History of Present Illness 73 y.o. female admitted to [facility] on 11/21/13 s/p elective L3/4 PLIF.  She has significant PMHx of lumbar disc surgery x 3, stroke, MVP, and anxiety.      PT Comments    Pt is POD #2 and is moving better today than yesterday.  She continues to be a bit impulsive and often lets go of the RW to try to do things in her room.  When she lets go of the RW with both hands her knees flex and she starts to sink and needs cues to straighten her legs and continue to hold on with at least one hand.  I think if she went home alone, she would do too much, not use the RW as instructed, and be at risk for falling.  SNF continues to be appropriate at this time.    Follow Up Recommendations  SNF     Equipment Recommendations  None recommended by PT    Recommendations for Other Services   NA     Precautions / Restrictions Precautions Precautions: Fall Precaution Comments: due to post op status, weakness, and impulsivity    Mobility  Transfers Overall transfer level: Needs assistance Equipment used: Rolling walker (2 wheeled) Transfers: Sit to/from Stand Sit to Stand: Min guard         General transfer comment: Min guard assist to transition from sitting to standing.  Verbal cues for safe hand placement during transitions  Ambulation/Gait Ambulation/Gait assistance: Min guard Ambulation Distance (Feet): 120 Feet Assistive device: Rolling walker (2 wheeled) Gait Pattern/deviations: Step-through pattern     General Gait Details: Pt seems to be walking better today with less knee flexion during gait, however, she is a bit impulive and when she does try to let go with both hands off of the RW to do things such as washing her hands or getting something in her bag in standing, she immidiately sinks down into bil knee flexion.  Verbal cues to extend  knees and leave one hand on RW at all times.  Pt is at high risk of falling and will likely do more at home than she can physically at this time.  A fall would be detrimental to her back.  She continues to be appropriate for SNF rehab.              Balance Overall balance assessment: Needs assistance Sitting-balance support: Feet supported Sitting balance-Leahy Scale: Good     Standing balance support: No upper extremity supported;Bilateral upper extremity supported Standing balance-Leahy Scale: Poor                      Cognition Arousal/Alertness: Awake/alert Behavior During Therapy: Impulsive Overall Cognitive Status: Within Functional Limits for tasks assessed                         General Comments General comments (skin integrity, edema, etc.): Pt only able to report 2/3 back precautions. Reinforced verbally with the pt.       Pertinent Vitals/Pain See vitals flow sheet.            PT Goals (current goals can now be found in the care plan section) Acute Rehab PT Goals Patient Stated Goal: To go to rehab and regain independence Progress towards PT goals: Progressing toward goals    Frequency  Min  5X/week    PT Plan Current plan remains appropriate       End of Session Equipment Utilized During Treatment: Back brace Activity Tolerance: Patient limited by pain Patient left: in chair;with call bell/phone within reach     Time: 1121-1135 PT Time Calculation (min): 14 min  Charges:  $Gait Training: 8-22 mins                     Mykira Hofmeister B. [place], [place], DPT [phone]   11/23/2013, 11:43 AM

## 2013-11-23 NOTE — Progress Notes (Addendum)
Clinical Social Work Department CLINICAL SOCIAL WORK PLACEMENT NOTE 11/23/2013  Patient:  Stephanie Alvarado, Stephanie Alvarado  Account Number:  [id number] Admit date:  11/21/2013  Clinical Social Worker:  Blima Rich, Latanya Presser  Date/time:  11/23/2013 02:10 PM  Clinical Social Work is seeking post-discharge placement for this patient at the following level of care:   SKILLED NURSING   (*CSW will update this form in Epic as items are completed)   11/22/2013  Patient/family provided with [place] Department of Clinical Social Work's list of facilities offering this level of care within the geographic area requested by the patient (or if unable, by the patient's family).  11/22/2013  Patient/family informed of their freedom to choose among providers that offer the needed level of care, that participate in Medicare, Medicaid or managed care program needed by the patient, have an available bed and are willing to accept the patient.  11/22/2013  Patient/family informed of [facility]' ownership interest in [facility], as well as of the fact that they are under no obligation to receive care at this facility.  PASARR submitted to EDS on 11/22/2013 PASARR number received on 11/22/2013  FL2 transmitted to all facilities in geographic area requested by pt/family on  11/23/2013 FL2 transmitted to all facilities within larger geographic area on   Patient informed that his/her managed care company has contracts with or will negotiate with  certain facilities, including the following:     Patient/family informed of bed offers received:  11/25/2013 Patient chooses bed at [facility] Physician recommends and patient chooses bed at    Patient to be transferred to  [facility] on  11/27/2013 Patient to be transferred to facility by [facility] Patient and family notified of transfer on 11/27/2013 Name of family member notified:  Pt and pt's fiance [Stephanie Alvarado] notified at  bedside  The following physician request were entered in Epic:   Additional Comments:  [name], MSW, [facility] Licensed Clinical Social Worker [phone] and [phone]

## 2013-11-23 NOTE — Progress Notes (Signed)
Postop day 2. Patient doing well overall. Reports that back and leg pains are very much improved. Back pain still significant but also improving.  Afebrile. Vital stable. Awake and alert. Oriented and appropriate. Motor and sensory function intact. Drain output lessening.  Doing well following multilevel decompression and fusion. Continue current efforts at mobilization. Remove drain today.

## 2013-11-24 NOTE — Progress Notes (Signed)
Overall doing reasonably well. Reports that her back pain is somewhat worse today but is not having any lower extremity pain. She's been up with therapy.  Afebrile. Vital stable. Wound clean and dry. Chest and abdomen benign. Motor and sensory function extremities normal.  Progressing reasonably well. Discharge plan pending. Home with home health versus skilled nursing facility to be determined tomorrow.

## 2013-11-24 NOTE — Progress Notes (Signed)
Clinical Education officer, museum (CSW) gave patient bed offers. Patient requested CSW to send SNF referral to [facility]. Weekday CSW will follow up.   Blima Rich, [place] Weekend CSW [phone]

## 2013-11-25 MED ORDER — HYDROXYZINE HCL 25 MG PO TABS
25.0000 mg | ORAL_TABLET | ORAL | Status: DC | PRN
  Administered 2013-11-25 – 2013-11-26 (×3): 25 mg via ORAL
  Filled 2013-11-25 (×3): qty 1

## 2013-11-25 MED FILL — Heparin Sodium (Porcine) Inj 1000 Unit/ML: INTRAMUSCULAR | Qty: 30 | Status: AC

## 2013-11-25 MED FILL — Sodium Chloride IV Soln 0.9%: INTRAVENOUS | Qty: 2000 | Status: AC

## 2013-11-25 NOTE — Care Management Note (Unsigned)
    Page 1 of 1   11/25/2013     4:10:42 PM CARE MANAGEMENT NOTE 11/25/2013  Patient:  Stephanie Alvarado, Stephanie Alvarado   Account Number:  [id number]  Date Initiated:  11/25/2013  Documentation initiated by:  Lorne Skeens  Subjective/Objective Assessment:   Patient admitted for PLIF. Lives at home with SNF.     Action/Plan:   Will follow for discharge needs pending PT/OT evals and physician orders.   Anticipated DC Date:  11/26/2013   Anticipated DC Plan:  SKILLED NURSING FACILITY  In-house referral  Clinical Social Worker         Choice offered to / List presented to:             Status of service:   Medicare Important Message given?  YES (If response is "NO", the following Medicare IM given date fields will be blank) Date Medicare IM given:  11/25/2013 Date Additional Medicare IM given:    Discharge Disposition:    Per UR Regulation:    If discussed at Long Length of Stay Meetings, dates discussed:    Comments:  11/25/13 [place], MSN, CM- Met with patient and husband to provide Medicare IM letter.

## 2013-11-25 NOTE — Progress Notes (Signed)
Physical Therapy Treatment Patient Details Name: Stephanie Alvarado MRN: [id number] DOB: Dec 07, 1940 Today's Date: 11/25/2013    History of Present Illness 73 y.o. female admitted to [facility] on 11/21/13 s/p elective L3/4 PLIF.  She has significant PMHx of lumbar disc surgery x 3, stroke, MVP, and anxiety.      PT Comments    Pt is POD #4 s/p lumbar fusion surgery.  She is progressing slowly with her gait, and gait speed measured today puts her at risk for recurrent falls. She continues to show signs of weakness in bil legs with flexed knee gait pattern, but reports her pain that she had PTA no longer goes into her legs.  I continue to recommend pt go to SNF for rehab before returning home alone.    Follow Up Recommendations  SNF     Equipment Recommendations  None recommended by PT    Recommendations for Other Services   NA     Precautions / Restrictions Precautions Precautions: Fall Precaution Comments: due to post op status, weakness, and impulsivity    Mobility  Bed Mobility Overal bed mobility: Needs Assistance Bed Mobility: Sit to Sidelying         Sit to sidelying: Min assist General bed mobility comments: Min assist to help support legs to get them up into bed last 1/4 of transition.  Verbal cues about hand placement to reduce twisting motion when going down to side lying.   Transfers Overall transfer level: Needs assistance Equipment used: Rolling walker (2 wheeled) Transfers: Sit to/from Stand Sit to Stand: Min guard         General transfer comment: Min guard assist to support trunk during transitions.   Ambulation/Gait Ambulation/Gait assistance: Min guard Ambulation Distance (Feet): 110 Feet Assistive device: Rolling walker (2 wheeled) Gait Pattern/deviations: Step-through pattern;Trunk flexed (flexed knees bil, pt seems to be weight shifted left) Gait velocity: 1.23 ft/sec Gait velocity interpretation: <1.8 ft/sec, indicative of risk for recurrent  falls General Gait Details: Pt continues to have flexed knees during gait and is weight shfited more to her left side.  She states this is interesting as her left side was her more painful side.            Balance Overall balance assessment: Needs assistance Sitting-balance support: Feet supported Sitting balance-Leahy Scale: Good     Standing balance support: No upper extremity supported Standing balance-Leahy Scale: Poor                      Cognition Arousal/Alertness: Awake/alert Behavior During Therapy: Impulsive Overall Cognitive Status: Within Functional Limits for tasks assessed                         General Comments General comments (skin integrity, edema, etc.): Pt able to report 3/3 back precautions.  Needed reinforcement about when to donn back brace as pt wanted to take it off prematurely and wanted to donn it afte she was up.       Pertinent Vitals/Pain See vitals flow sheet.      PT Goals (current goals can now be found in the care plan section) Acute Rehab PT Goals Patient Stated Goal: To go to rehab and regain independence Progress towards PT goals: Progressing toward goals    Frequency  Min 5X/week    PT Plan Current plan remains appropriate       End of Session Equipment Utilized During Treatment: Back brace Activity Tolerance: Patient  limited by pain Patient left: in bed;with call bell/phone within reach     Time: 1136-1149 PT Time Calculation (min): 13 min  Charges:  $Gait Training: 8-22 mins            Ajai Terhaar B. [place], [place], DPT [phone]   11/25/2013, 2:13 PM

## 2013-11-25 NOTE — Progress Notes (Signed)
Subjective: Patient reports feeling better.  Objective: Vital signs in last 24 hours: Temp:  [97.9 F (36.6 C)-98.9 F (37.2 C)] 98.6 F (37 C) (06/15 0553) Pulse Rate:  [61-77] 63 (06/15 0553) Resp:  [18] 18 (06/15 0216) BP: (90-113)/(42-60) 113/60 mmHg (06/15 0553) SpO2:  [98 %-100 %] 100 % (06/15 0553)  Intake/Output from previous day: 06/14 0701 - 06/15 0700 In: 723 [P.O.:720; I.V.:3] Out: -  Intake/Output this shift:    Physical Exam: No leg complaints.  Up in brace.  Lab Results: No results found for this basename: WBC, HGB, HCT, PLT,  in the last 72 hours BMET No results found for this basename: NA, K, CL, CO2, GLUCOSE, BUN, CREATININE, CALCIUM,  in the last 72 hours  Studies/Results: No results found.  Assessment/Plan: Improving.  Wants to go to SNF for Rehab.    LOS: 4 days    Peggyann Shoals, MD 11/25/2013, 8:05 AM

## 2013-11-25 NOTE — Clinical Social Work Note (Addendum)
CSW provided pt with extended SNF search bed offers. Pt to review bed offers and provide CSW with decision later in the afternoon. CSW to continue to follow and assist with discharge planning needs.  3:02pm CSW followed-up with pt regarding SNF bed choice. Pt is currently sleeping and requested CSW to return on the morning of 11/26/2013. Pt stated she has called facilities, and pt's son will be following-up with the facilities this afternoon (11/25/2013) regarding bed choice. CSW to continue to follow and assist with discharge planning needs.  4:16pm Pt has accepted bed offer with [facility]. CSW has informed admissions with [facility]. CSW to continue to follow and assist with discharge planning needs.  [name], MSW, [facility] Licensed Clinical Social Worker [phone] and [phone]

## 2013-11-26 MED ORDER — DEXAMETHASONE SODIUM PHOSPHATE 4 MG/ML IJ SOLN
4.0000 mg | Freq: Three times a day (TID) | INTRAMUSCULAR | Status: AC
  Administered 2013-11-26 – 2013-11-27 (×3): 4 mg via INTRAVENOUS
  Filled 2013-11-26 (×2): qty 1

## 2013-11-26 NOTE — Progress Notes (Signed)
Subjective: Patient reports quite sore in back today.  Wants to wait for SNF transfer until am.  Objective: Vital signs in last 24 hours: Temp:  [98 F (36.7 C)-101.1 F (38.4 C)] 98.5 F (36.9 C) (06/16 0619) Pulse Rate:  [65-92] 74 (06/16 0619) Resp:  [18-20] 18 (06/16 0619) BP: (89-147)/(41-65) 147/47 mmHg (06/16 0619) SpO2:  [90 %-96 %] 96 % (06/16 0619)  Intake/Output from previous day: 06/15 0701 - 06/16 0700 In: 600 [P.O.:600] Out: -  Intake/Output this shift:    Physical Exam: Dressing CDI.  MAEW with good strength.  Lab Results: No results found for this basename: WBC, HGB, HCT, PLT,  in the last 72 hours BMET No results found for this basename: NA, K, CL, CO2, GLUCOSE, BUN, CREATININE, CALCIUM,  in the last 72 hours  Studies/Results: No results found.  Assessment/Plan: Patient making slow improvements.  Will need to tx to SNF, hopefully in AM.    LOS: 5 days    STERN,JOSEPH D, MD 11/26/2013, 9:25 AM

## 2013-11-26 NOTE — Progress Notes (Signed)
Physical Therapy Treatment Patient Details Name: Stephanie Alvarado MRN: [id number] DOB: 12-22-1940 Today's Date: 11/26/2013    History of Present Illness 73 y.o. female admitted to [facility] on 11/21/13 s/p elective L3/4 PLIF.  She has significant PMHx of lumbar disc surgery x 3, stroke, MVP, and anxiety.      PT Comments    Pt is POD #5 s/p lumbar fusion surgery.   She continues to show signs of bil LE weakness, but continued central low back pain (no pain into the legs).  She is slowly progressing with mobility, but is not safe to go home alone.  She continues to be appropriate for SNF level rehab at d/c.  PT to continue to follow acutely.    Follow Up Recommendations  SNF     Equipment Recommendations  None recommended by PT    Recommendations for Other Services    NA     Precautions / Restrictions Precautions Precautions: Fall Precaution Comments: due to post op status, weakness, and impulsivity Restrictions Weight Bearing Restrictions: No    Mobility  Bed Mobility Overal bed mobility: Needs Assistance Bed Mobility: Rolling;Sidelying to Sit;Sit to Sidelying Rolling: Supervision Sidelying to sit: Supervision     Sit to sidelying: Min assist General bed mobility comments: Pt did well with OOB mobility, cues to limit twisting and correct log roll technique.  Min assist to lift legs to get back into bed and MAX verbal cues for correct techniqe (reverse log roll back into bed).    Transfers Overall transfer level: Needs assistance Equipment used: Rolling walker (2 wheeled) Transfers: Sit to/from Stand Sit to Stand: Supervision         General transfer comment: supervision for safety due to slow transition up and down and over flexed knees.   Ambulation/Gait Ambulation/Gait assistance: Min guard Ambulation Distance (Feet): 110 Feet Assistive device: Rolling walker (2 wheeled) Gait Pattern/deviations: Step-through pattern;Shuffle;Trunk flexed;Decreased weight shift to right  (flexed knee gait pattern) Gait velocity: decreased Gait velocity interpretation: Below normal speed for age/gender General Gait Details: Continued flexed knee gait, verbal reminders for posture as pt wants to look at her feet during gait.  Continues with mild weight shift to the left leg and increased right knee flexion compared to left during gait.            Balance Overall balance assessment: Needs assistance Sitting-balance support: Feet supported;No upper extremity supported Sitting balance-Leahy Scale: Good     Standing balance support: No upper extremity supported;Bilateral upper extremity supported Standing balance-Leahy Scale: Poor Standing balance comment: Pt standing and washing hands at sink with no upper extremity support and continues to show increased knee flexion that slowly sinks as pt trys to maintain no hand support.  She then threw her paper towel into the trash can by purposefully bending both knees (good form), but could not get back up without min assit to help extend both knees and get a hold of the RW for arms to help support trunk over weak legs.                     Cognition Arousal/Alertness: Lethargic;Suspect due to medications Behavior During Therapy: [facility] for tasks assessed/performed Overall Cognitive Status: Within Functional Limits for tasks assessed                             Pertinent Vitals/Pain See vitals flow sheet.  PT Goals (current goals can now be found in the care plan section) Acute Rehab PT Goals Patient Stated Goal: To go to rehab and regain independence Progress towards PT goals: Progressing toward goals    Frequency  Min 5X/week    PT Plan Current plan remains appropriate       End of Session Equipment Utilized During Treatment: Back brace Activity Tolerance: Patient limited by fatigue Patient left: in bed;with call bell/phone within reach     Time: 0952-1012 PT Time Calculation (min):  20 min  Charges:  $Gait Training: 8-22 mins            Rebecca B. [place], [place], DPT [phone]   11/26/2013, 10:56 AM

## 2013-11-27 MED ORDER — DIAZEPAM 5 MG PO TABS: 5.0000 mg | ORAL_TABLET | Freq: Four times a day (QID) | ORAL | Status: DC | PRN

## 2013-11-27 MED ORDER — OXYCODONE-ACETAMINOPHEN 5-325 MG PO TABS: 1.0000 | ORAL_TABLET | ORAL | Status: DC | PRN

## 2013-11-27 MED ORDER — OCUVITE PO TABS
1.0000 | ORAL_TABLET | Freq: Every day | ORAL | Status: DC
  Administered 2013-11-27: 1 via ORAL
  Filled 2013-11-27: qty 1

## 2013-11-27 NOTE — Progress Notes (Signed)
Subjective: Patient reports feeling better today  Objective: Vital signs in last 24 hours: Temp:  [97.7 F (36.5 C)-99.1 F (37.3 C)] 97.7 F (36.5 C) (06/17 0627) Pulse Rate:  [67-89] 74 (06/17 0627) Resp:  [18-20] 20 (06/17 0627) BP: (99-121)/(43-68) 102/43 mmHg (06/17 0627) SpO2:  [94 %-100 %] 100 % (06/17 0627)  Intake/Output from previous day: 06/16 0701 - 06/17 0700 In: 500 [P.O.:500] Out: -  Intake/Output this shift:    Physical Exam: Full strength, Dressing CDI  Lab Results: No results found for this basename: WBC, HGB, HCT, PLT,  in the last 72 hours BMET No results found for this basename: NA, K, CL, CO2, GLUCOSE, BUN, CREATININE, CALCIUM,  in the last 72 hours  Studies/Results: No results found.  Assessment/Plan: OK to transfer to SNF today.    LOS: 6 days    Peggyann Shoals, MD 11/27/2013, 8:36 AM

## 2013-11-27 NOTE — Progress Notes (Signed)
Called report to [facility].  Patient awaiting PTAR.  Kizzie Bane, RN

## 2013-11-27 NOTE — Clinical Social Work Note (Signed)
CSW has faxed discharge summary to [facility]. Discharge packet complete and placed on pt's shadow chart. Pt and pt's fiance [John] notified of discharge at bedside. Pt to be transported via EMS (PTAR) at 10:30am. RN notified and provided with report number.  [name], MSW, [facility] Licensed Clinical Social Worker [phone] and [phone]

## 2013-11-27 NOTE — Discharge Summary (Signed)
Physician Discharge Summary  Patient ID: Stephanie Alvarado MRN: [id number] DOB/AGE: 75-Jan-1942 75 y.o.  Admit date: 11/21/2013 Discharge date: 11/27/2013  Admission Diagnoses:Lumbar scoliosis, stenosis, herniated disc, spondylosis, radiculopathy  Discharge Diagnoses: Same Active Problems:   Lumbar scoliosis   Discharged Condition: good  Hospital Course: Patient underwent exploration of fusion L 4 - S 1 levels with decompression and fusion L 34 level.  She was mobilized with PT and gradually improved.  Leg pain significantly better  Consults: rehabilitation medicine  Significant Diagnostic Studies: None  Treatments: surgery: exploration of fusion L 4 - S 1 levels with decompression and fusion L 34 level.   Discharge Exam: Blood pressure 102/43, pulse 74, temperature 97.7 F (36.5 C), temperature source Oral, resp. rate 20, height 5\' 8"  (1.727 m), weight 85.276 kg (188 lb), SpO2 100.00%. Neurologic: Alert and oriented X 3, normal strength and tone. Normal symmetric reflexes. Normal coordination and gait Wound:CDI  Disposition: SNF     Medication List    STOP taking these medications       naproxen sodium 220 MG tablet  Commonly known as:  ANAPROX      TAKE these medications       amLODipine 5 MG tablet  Commonly known as:  NORVASC  Take 5 mg by mouth daily.     atorvastatin 40 MG tablet  Commonly known as:  LIPITOR  Take 20 mg by mouth Daily.     BIOTIN PO  Take 1 tablet by mouth daily.     clopidogrel 75 MG tablet  Commonly known as:  PLAVIX  Take 1 tablet (75 mg total) by mouth daily.     diazepam 5 MG tablet  Commonly known as:  VALIUM  Take 1 tablet (5 mg total) by mouth every 6 (six) hours as needed for muscle spasms.     EYE VITAMINS Caps  Take 1 capsule by mouth daily.     furosemide 40 MG tablet  Commonly known as:  LASIX  Take 40 mg by mouth daily as needed for edema.     gabapentin 600 MG tablet  Commonly known as:  NEURONTIN  Take 600 mg by  mouth 3 (three) times daily.     isosorbide mononitrate 30 MG 24 hr tablet  Commonly known as:  IMDUR  Take 30 mg by mouth daily.     meloxicam 15 MG tablet  Commonly known as:  MOBIC  Take 15 mg by mouth daily.     metoprolol 50 MG tablet  Commonly known as:  LOPRESSOR  Take 50 mg by mouth 2 (two) times daily.     nitroGLYCERIN 0.4 MG SL tablet  Commonly known as:  NITROSTAT  Place 1 tablet (0.4 mg total) under the tongue every 5 (five) minutes as needed.     oxyCODONE-acetaminophen 5-325 MG per tablet  Commonly known as:  PERCOCET/ROXICET  Take 1 tablet by mouth every 6 (six) hours as needed. Pain     oxyCODONE-acetaminophen 5-325 MG per tablet  Commonly known as:  PERCOCET/ROXICET  Take 1-2 tablets by mouth every 4 (four) hours as needed for moderate pain or severe pain.     pantoprazole 40 MG tablet  Commonly known as:  PROTONIX  Take 40 mg by mouth at bedtime as needed (for indigestion).     polyethylene glycol powder powder  Commonly known as:  GLYCOLAX/MIRALAX  Take 17 g by mouth daily.     tiZANidine 2 MG tablet  Commonly known as:  ZANAFLEX  Take 2 mg by mouth 3 (three) times daily.     VITAMIN B-12 PO  Take 1 tablet by mouth daily.     VITAMIN D PO  Take 1 tablet by mouth daily.     zolpidem 10 MG tablet  Commonly known as:  AMBIEN  Take 1 tablet (10 mg total) by mouth at bedtime as needed.         Signed: Peggyann Shoals, MD 11/27/2013, 8:37 AM

## 2013-11-29 ENCOUNTER — Other Ambulatory Visit: Payer: Self-pay | Admitting: *Deleted

## 2013-11-29 MED ORDER — ZOLPIDEM TARTRATE 10 MG PO TABS: 10.0000 mg | ORAL_TABLET | Freq: Every evening | ORAL | Status: DC | PRN

## 2013-11-29 NOTE — Telephone Encounter (Signed)
[facility] 

## 2013-12-04 ENCOUNTER — Non-Acute Institutional Stay (SKILLED_NURSING_FACILITY): Payer: Medicare HMO | Admitting: Internal Medicine

## 2013-12-04 DIAGNOSIS — Z981 Arthrodesis status: Secondary | ICD-10-CM

## 2013-12-04 DIAGNOSIS — M48061 Spinal stenosis, lumbar region without neurogenic claudication: Secondary | ICD-10-CM

## 2013-12-04 NOTE — Progress Notes (Signed)
Patient ID: Stephanie Alvarado, female   DOB: 1941/01/24, 73 y.o.   MRN: [id number]  Provider:  Rexene Edison. Mariea Clonts, D.O., C.M.D. Location:  [facility]   PCP: Gar Ponto, MD   Allergies  Allergen Reactions  . Codeine     REACTION: itch    Chief Complaint  Patient presents with  . Hospitalization Follow-up    new admission s/p hospitalization for L3-4 posterior lumbar fusion  . Discharge Note    HPI: 73 y.o. female here for rehab after L3-4 spinal fusion.  She is already going home.  Is able to do additional home health or outpatient therapy.  Does not want to stay here any longer.  ROS: Review of Systems  Constitutional: Negative for fever.  Respiratory: Negative for shortness of breath.   Cardiovascular: Negative for chest pain.  Gastrointestinal: Negative for abdominal pain and constipation.  Genitourinary: Negative for dysuria.  Musculoskeletal: Positive for myalgias and back pain. Negative for falls.       Improved     Past Medical History  Diagnosis Date  . Hypertension   . CAD (coronary artery disease)     DES and circumflex 2006 /  DES to LAD 2011  . Hyperlipemia   . Hiatal hernia   . Esophageal reflux   . Lumbar disc disease   . Allergic rhinitis, cause unspecified   . Sleep related hypoventilation/hypoxemia in conditions classifiable elsewhere   . Shortness of breath   . Other diseases of lung, not elsewhere classified   . Chronic diastolic heart failure   . Peripheral vascular disease, unspecified   . Unspecified venous (peripheral) insufficiency   . Obesity, unspecified   . Cyst of thyroid   . Diverticulosis of colon (without mention of hemorrhage)   . Personal history of colonic polyps     ADENOMATOUS POLYP  . Osteoarthrosis, unspecified whether generalized or localized, unspecified site   . Lumbago   . Nonspecific abnormal finding in stool contents   . Unspecified cerebral artery occlusion with cerebral infarction   . Anxiety state,  unspecified   . Anemia, unspecified   . Stroke 11/2008    Treated with TPA? /     2010, TEE no left atrial clot, probably from atherosclerosis of the aortic arch  . Ejection fraction      EF 60%, Echo, November 12, 2008, /  EF 55%, TEE, November 17, 2008  . Atherosclerosis of aorta     TEE, June, 2010, grade 4 aortic arch atherosclerosis , Dr. Aundra Dubin  . Dysphasia     Possibly esophageal stricture  . Carotid artery disease     Doppler, November, 2011, stable, 0-39% bilateral, turbulent flow left subclavian  . Palpitations     October, 2012  . Subclavian artery disease     Remote surgery by Dr. Victorino Dike.  Marland Kitchen PFO (patent foramen ovale)     Patient had a very small PFO by TEE 2010.  This was seen only by bubble analysis during Valsalva  . MVP (mitral valve prolapse)   . Osteopenia   . Sleep apnea    Past Surgical History  Procedure Laterality Date  . Coronary angioplasty with stent placement  1990's  . Laparoscopic hysterectomy    . Urethral suspension  1993    Dr. Nori Riis  . Lumbar disc surgery      X 3  . Cholecystectomy    . Varicose vein surgery      x 2  . Cataracts  both eyes  . Back surgery     Social History:   reports that she quit smoking about 15 years ago. Her smoking use included Cigarettes. She has a 40 pack-year smoking history. She has never used smokeless tobacco. She reports that she does not drink alcohol or use illicit drugs.  Family History  Problem Relation Age of Onset  . Heart disease Father   . Breast cancer      cousions  . Alzheimer's disease Mother   . Colon cancer Paternal Aunt   . Esophageal cancer Neg Hx   . Rectal cancer Neg Hx   . Stomach cancer Neg Hx     Medications: Patient's Medications  New Prescriptions   No medications on file  Previous Medications   AMLODIPINE (NORVASC) 5 MG TABLET    Take 5 mg by mouth daily.     ATORVASTATIN (LIPITOR) 40 MG TABLET    Take 20 mg by mouth Daily.    BIOTIN PO    Take 1 tablet by mouth daily.    CHOLECALCIFEROL (VITAMIN D PO)    Take 1 tablet by mouth daily.   CLOPIDOGREL (PLAVIX) 75 MG TABLET    Take 1 tablet (75 mg total) by mouth daily.   CYANOCOBALAMIN (VITAMIN B-12 PO)    Take 1 tablet by mouth daily.   DIAZEPAM (VALIUM) 5 MG TABLET    Take 1 tablet (5 mg total) by mouth every 6 (six) hours as needed for muscle spasms.   FUROSEMIDE (LASIX) 40 MG TABLET    Take 40 mg by mouth daily as needed for edema.    GABAPENTIN (NEURONTIN) 600 MG TABLET    Take 600 mg by mouth 3 (three) times daily.   ISOSORBIDE MONONITRATE (IMDUR) 30 MG 24 HR TABLET    Take 30 mg by mouth daily.   MELOXICAM (MOBIC) 15 MG TABLET    Take 15 mg by mouth daily.   METOPROLOL (LOPRESSOR) 50 MG TABLET    Take 50 mg by mouth 2 (two) times daily.    MULTIPLE VITAMINS-MINERALS (EYE VITAMINS) CAPS    Take 1 capsule by mouth daily.   NITROGLYCERIN (NITROSTAT) 0.4 MG SL TABLET    Place 1 tablet (0.4 mg total) under the tongue every 5 (five) minutes as needed.   OXYCODONE-ACETAMINOPHEN (PERCOCET/ROXICET) 5-325 MG PER TABLET    Take 1 tablet by mouth every 6 (six) hours as needed. Pain   OXYCODONE-ACETAMINOPHEN (PERCOCET/ROXICET) 5-325 MG PER TABLET    Take 1-2 tablets by mouth every 4 (four) hours as needed for moderate pain or severe pain.   PANTOPRAZOLE (PROTONIX) 40 MG TABLET    Take 40 mg by mouth at bedtime as needed (for indigestion).   POLYETHYLENE GLYCOL POWDER (GLYCOLAX/MIRALAX) POWDER    Take 17 g by mouth daily.   TIZANIDINE (ZANAFLEX) 2 MG TABLET    Take 2 mg by mouth 3 (three) times daily.   ZOLPIDEM (AMBIEN) 10 MG TABLET    Take 1 tablet (10 mg total) by mouth at bedtime as needed.  Modified Medications   No medications on file  Discontinued Medications   No medications on file     Physical Exam: Filed Vitals:   12/04/13 1101  BP: 116/74  Pulse: 82  Temp: 97.2 F (36.2 C)  Resp: 17  Weight: 194 lb (87.998 kg)  SpO2: 94%   Physical Exam  Constitutional: She is oriented to person, place, and time.  No distress.  Cardiovascular: Normal rate, regular rhythm, normal heart  sounds and intact distal pulses.   Pulmonary/Chest: Effort normal and breath sounds normal. No respiratory distress.  Abdominal: Soft. Bowel sounds are normal. She exhibits no distension and no mass. There is no tenderness.  Musculoskeletal: She exhibits tenderness.  Of lumbar incision area  Neurological: She is alert and oriented to person, place, and time.  Skin:  No erythema, warmth, drainage from incision site  Psychiatric: She has a normal mood and affect.     Labs reviewed: Basic Metabolic Panel:  Recent Labs  11/14/13 1530  NA 141  K 4.7  CL 102  CO2 27  GLUCOSE 100*  BUN 17  CREATININE 0.83  CALCIUM 10.0   CBC:  Recent Labs  11/14/13 1530  WBC 5.9  HGB 13.8  HCT 40.3  MCV 94.4  PLT 225    Assessment/Plan 1. Spinal stenosis of lumbar region -would like to finish rehab at home so d/c home with home health PT, OT, keep f/u with surgeon and primary care   2. S/P lumbar spinal fusion -improving, safe for d/c home with home health  Other chronic medical conditions:  meds not changed--30 days supply provided   Family/ staff Communication: see with unit supervisor  D/c home with home health PT, OT F/u with PCP, Dr. Quillian Quince, and her surgeon, Dr. Erline Levine

## 2013-12-10 DIAGNOSIS — Z7901 Long term (current) use of anticoagulants: Secondary | ICD-10-CM

## 2013-12-10 DIAGNOSIS — Z4789 Encounter for other orthopedic aftercare: Secondary | ICD-10-CM

## 2013-12-10 DIAGNOSIS — Z8673 Personal history of transient ischemic attack (TIA), and cerebral infarction without residual deficits: Secondary | ICD-10-CM

## 2013-12-10 DIAGNOSIS — I10 Essential (primary) hypertension: Secondary | ICD-10-CM

## 2013-12-23 ENCOUNTER — Other Ambulatory Visit (HOSPITAL_COMMUNITY): Payer: Self-pay | Admitting: Cardiology

## 2013-12-23 DIAGNOSIS — I6529 Occlusion and stenosis of unspecified carotid artery: Secondary | ICD-10-CM

## 2013-12-24 ENCOUNTER — Ambulatory Visit (HOSPITAL_COMMUNITY): Payer: Medicare HMO | Attending: Cardiology | Admitting: *Deleted

## 2013-12-24 DIAGNOSIS — I6529 Occlusion and stenosis of unspecified carotid artery: Secondary | ICD-10-CM | POA: Insufficient documentation

## 2013-12-24 DIAGNOSIS — Z87891 Personal history of nicotine dependence: Secondary | ICD-10-CM | POA: Insufficient documentation

## 2013-12-24 DIAGNOSIS — I2510 Atherosclerotic heart disease of native coronary artery without angina pectoris: Secondary | ICD-10-CM | POA: Insufficient documentation

## 2013-12-24 DIAGNOSIS — R0989 Other specified symptoms and signs involving the circulatory and respiratory systems: Secondary | ICD-10-CM | POA: Insufficient documentation

## 2013-12-24 DIAGNOSIS — E785 Hyperlipidemia, unspecified: Secondary | ICD-10-CM | POA: Insufficient documentation

## 2013-12-24 DIAGNOSIS — J449 Chronic obstructive pulmonary disease, unspecified: Secondary | ICD-10-CM | POA: Insufficient documentation

## 2013-12-24 DIAGNOSIS — I10 Essential (primary) hypertension: Secondary | ICD-10-CM | POA: Insufficient documentation

## 2013-12-24 DIAGNOSIS — J4489 Other specified chronic obstructive pulmonary disease: Secondary | ICD-10-CM | POA: Insufficient documentation

## 2013-12-24 DIAGNOSIS — R42 Dizziness and giddiness: Secondary | ICD-10-CM | POA: Insufficient documentation

## 2013-12-24 NOTE — Progress Notes (Signed)
Carotid duplex complete 

## 2014-01-14 ENCOUNTER — Other Ambulatory Visit: Payer: Self-pay | Admitting: Obstetrics & Gynecology

## 2014-01-15 LAB — CYTOLOGY - PAP

## 2014-01-29 ENCOUNTER — Encounter (HOSPITAL_COMMUNITY): Payer: Medicare HMO

## 2014-05-27 ENCOUNTER — Encounter: Payer: Self-pay | Admitting: Internal Medicine

## 2014-07-18 ENCOUNTER — Encounter: Payer: Self-pay | Admitting: Vascular Surgery

## 2014-07-18 ENCOUNTER — Other Ambulatory Visit: Payer: Self-pay | Admitting: *Deleted

## 2014-07-18 DIAGNOSIS — I83892 Varicose veins of left lower extremities with other complications: Secondary | ICD-10-CM

## 2014-07-21 ENCOUNTER — Encounter: Payer: Self-pay | Admitting: Vascular Surgery

## 2014-07-21 ENCOUNTER — Ambulatory Visit (HOSPITAL_COMMUNITY)
Admission: RE | Admit: 2014-07-21 | Discharge: 2014-07-21 | Disposition: A | Discharge status: Home / Self Care | Payer: Medicare HMO | Source: Ambulatory Visit | Attending: Vascular Surgery | Admitting: Vascular Surgery

## 2014-07-21 ENCOUNTER — Ambulatory Visit (INDEPENDENT_AMBULATORY_CARE_PROVIDER_SITE_OTHER): Payer: Medicare HMO | Admitting: Vascular Surgery

## 2014-07-21 VITALS — BP 129/72 | HR 75 | Resp 16 | Ht 68.0 in | Wt 204.0 lb

## 2014-07-21 DIAGNOSIS — I83892 Varicose veins of left lower extremities with other complications: Secondary | ICD-10-CM | POA: Diagnosis not present

## 2014-07-21 NOTE — Progress Notes (Signed)
Subjective:     Patient ID: Stephanie Alvarado, female   DOB: 03/24/1941, 74 y.o.   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 She has a remote history of bilateral great saphenous vein strippings and removal of multiple varicosities 30 or 40 years ago. 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 She has difficulty putting on long-leg elastic compression stockings because of back problems. She does try elevation ibuprofen. She has no history of DVT or thrombophlebitis or stasis ulcers or bleeding. The symptoms of pain which include burning throbbing and aching discomfort as well as distal edema have continued to get worse.  Past Medical History  Diagnosis Date  . Hypertension   . CAD (coronary artery disease)     DES and circumflex 2006 /  DES to LAD 2011  . Hyperlipemia   . Hiatal hernia   . Esophageal reflux   . Lumbar disc disease   . Allergic rhinitis, cause unspecified   . Sleep related hypoventilation/hypoxemia in conditions classifiable elsewhere   . Shortness of breath   . Other diseases of lung, not elsewhere classified   . Chronic diastolic heart failure   . Peripheral vascular disease, unspecified   . Unspecified venous (peripheral) insufficiency   . Obesity, unspecified   . Cyst of thyroid   . Diverticulosis of colon (without mention of hemorrhage)   . Personal history of colonic polyps     ADENOMATOUS POLYP  . Osteoarthrosis, unspecified whether generalized or localized, unspecified site   . Lumbago   . Nonspecific abnormal finding in stool contents   . Unspecified cerebral artery occlusion with cerebral infarction   . Anxiety state, unspecified   . Anemia, unspecified   . Stroke 11/2008    Treated with TPA? /     2010, TEE no left atrial clot, probably from atherosclerosis of the aortic  arch  . Ejection fraction      EF 60%, Echo, November 12, 2008, /  EF 55%, TEE, November 17, 2008  . Atherosclerosis of aorta     TEE, June, 2010, grade 4 aortic arch atherosclerosis , Dr. Aundra Dubin  . Dysphasia     Possibly esophageal stricture  . Carotid artery disease     Doppler, November, 2011, stable, 0-39% bilateral, turbulent flow left subclavian  . Palpitations     October, 2012  . Subclavian artery disease     Remote surgery by Dr. Victorino Dike.  Marland Kitchen PFO (patent foramen ovale)     Patient had a very small PFO by TEE 2010.  This was seen only by bubble analysis during Valsalva  . MVP (mitral valve prolapse)   . Osteopenia   . Sleep apnea     History  Substance Use Topics  . Smoking status: Former Smoker -- 1.00 packs/day for 40 years    Types: Cigarettes    Quit date: 06/13/1998  . Smokeless tobacco: Never Used  . Alcohol Use: No    Family History  Problem Relation Age of Onset  . Heart disease Father   . Breast cancer      cousions  . Alzheimer's disease Mother   . Colon cancer Paternal Aunt   . Esophageal cancer Neg Hx   . Rectal cancer Neg Hx   . Stomach cancer Neg Hx     Allergies  Allergen  Reactions  . Codeine     REACTION: itch     Current outpatient prescriptions:  .  amLODipine (NORVASC) 5 MG tablet, Take 5 mg by mouth daily.  , Disp: , Rfl:  .  atorvastatin (LIPITOR) 40 MG tablet, Take 20 mg by mouth Daily. , Disp: , Rfl:  .  BIOTIN PO, Take 1 tablet by mouth daily., Disp: , Rfl:  .  Cholecalciferol (VITAMIN D PO), Take 1 tablet by mouth daily., Disp: , Rfl:  .  clopidogrel (PLAVIX) 75 MG tablet, Take 1 tablet (75 mg total) by mouth daily., Disp: 90 tablet, Rfl: 3 .  Cyanocobalamin (VITAMIN B-12 PO), Take 1 tablet by mouth daily., Disp: , Rfl:  .  diazepam (VALIUM) 5 MG tablet, Take 1 tablet (5 mg total) by mouth every 6 (six) hours as needed for muscle spasms., Disp: 40 tablet, Rfl: 0 .  furosemide (LASIX) 40 MG tablet, Take 40 mg by mouth daily as needed for  edema. , Disp: , Rfl:  .  gabapentin (NEURONTIN) 600 MG tablet, Take 600 mg by mouth 3 (three) times daily., Disp: , Rfl:  .  isosorbide mononitrate (IMDUR) 30 MG 24 hr tablet, Take 30 mg by mouth daily., Disp: , Rfl:  .  meloxicam (MOBIC) 15 MG tablet, Take 15 mg by mouth daily., Disp: , Rfl:  .  metoprolol (LOPRESSOR) 50 MG tablet, Take 50 mg by mouth 2 (two) times daily. , Disp: , Rfl:  .  Multiple Vitamins-Minerals (EYE VITAMINS) CAPS, Take 1 capsule by mouth daily., Disp: , Rfl:  .  nitroGLYCERIN (NITROSTAT) 0.4 MG SL tablet, Place 1 tablet (0.4 mg total) under the tongue every 5 (five) minutes as needed., Disp: 25 tablet, Rfl: 3 .  pantoprazole (PROTONIX) 40 MG tablet, Take 40 mg by mouth at bedtime as needed (for indigestion)., Disp: , Rfl:  .  polyethylene glycol powder (GLYCOLAX/MIRALAX) powder, Take 17 g by mouth daily., Disp: , Rfl:  .  tiZANidine (ZANAFLEX) 2 MG tablet, Take 2 mg by mouth 3 (three) times daily., Disp: , Rfl:  .  zolpidem (AMBIEN) 10 MG tablet, Take 1 tablet (10 mg total) by mouth at bedtime as needed., Disp: 30 tablet, Rfl: 0 .  oxyCODONE-acetaminophen (PERCOCET/ROXICET) 5-325 MG per tablet, Take 1 tablet by mouth every 6 (six) hours as needed. Pain, Disp: , Rfl:  .  oxyCODONE-acetaminophen (PERCOCET/ROXICET) 5-325 MG per tablet, Take 1-2 tablets by mouth every 4 (four) hours as needed for moderate pain or severe pain. (Patient not taking: Reported on 07/21/2014), Disp: 80 tablet, Rfl: 0  BP 129/72 mmHg  Pulse 75  Resp 16  Ht 5\' 8"  (1.727 m)  Wt 204 lb (92.534 kg)  BMI 31.03 kg/m2  Body mass index is 31.03 kg/(m^2).         Review of Systems denies chest pain, dyspnea on exertion, PND, orthopnea. Does have history of coronary artery disease with 2 previous stents in the past. Also has history of stroke with some numbness in the left hand. Other systems negative and complete review of systems     Objective:   Physical Exam BP 129/72 mmHg  Pulse 75  Resp  16  Ht 5\' 8"  (1.727 m)  Wt 204 lb (92.534 kg)  BMI 31.03 kg/m2  Gen.-alert and oriented x3 in no apparent distress HEENT normal for age Lungs no rhonchi or wheezing Cardiovascular regular rhythm no murmurs carotid pulses 3+ palpable no bruits audible Abdomen soft nontender no palpable masses Musculoskeletal free of  major deformities Skin clear -no rashes Neurologic normal Lower extremities 3+ femoral and dorsalis pedis pulses palpable bilaterally with 1+ edema bilaterally Left leg with large bulging varicosities in posterior thigh extending down to popliteal fossa with extensive reticular and spider veins. 1+ edema distally with no active ulcer or hyperpigmentation.  Today I ordered left leg venous duplex exam which are reviewed and interpreted. There is no DVT. The left great saphenous vein is absent. Left small saphenous vein has gross reflux and communicates with the vein of Giaocmini. This communicates with a large bulging varicosities posteriorly.       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    Plan:         #1 long leg elastic compression stockings 20-30 mm gradient #2 elevate legs as much as possible #3 ibuprofen daily on a regular basis for pain #4 return in 3 months-if no significant improvement then she should have #1 laser ablation left small saphenous vein. She should then return in 3 months to be evaluated for stab phlebectomy of multiple painful varicosities in the distal thigh and popliteal fossa and possible sclerotherapy. Patient return in 3 months

## 2014-10-06 ENCOUNTER — Ambulatory Visit (INDEPENDENT_AMBULATORY_CARE_PROVIDER_SITE_OTHER): Payer: Medicare HMO | Admitting: Cardiology

## 2014-10-06 ENCOUNTER — Encounter: Payer: Self-pay | Admitting: Cardiology

## 2014-10-06 VITALS — BP 124/62 | HR 58 | Ht 68.0 in | Wt 192.6 lb

## 2014-10-06 DIAGNOSIS — I779 Disorder of arteries and arterioles, unspecified: Secondary | ICD-10-CM

## 2014-10-06 DIAGNOSIS — I10 Essential (primary) hypertension: Secondary | ICD-10-CM

## 2014-10-06 DIAGNOSIS — I2510 Atherosclerotic heart disease of native coronary artery without angina pectoris: Secondary | ICD-10-CM

## 2014-10-06 DIAGNOSIS — I739 Peripheral vascular disease, unspecified: Secondary | ICD-10-CM

## 2014-10-06 NOTE — Assessment & Plan Note (Signed)
The patient had her last carotid Doppler July, 2015. She had 1-39% bilateral stenoses. Her left common carotid artery - subclavian artery bypass graft was patent. She does have greater than 50% left external carotid artery stenosis. She does not yet need a follow-up.

## 2014-10-06 NOTE — Assessment & Plan Note (Signed)
Patient has had interventions in the past. Her last nuclear study was May, 2011. There was no definite ischemia. I am not convinced that she is having any ischemic symptoms at this time. No further workup.

## 2014-10-06 NOTE — Assessment & Plan Note (Signed)
She has significant venous insufficiency. This is followed by Dr. Kellie Simmering.

## 2014-10-06 NOTE — Progress Notes (Signed)
Cardiology Office Note   Date:  10/06/2014   ID:  Danyla, Wattley 1940-09-12, MRN [id number]  PCP:  Gar Ponto, MD  Cardiologist:  Dola Argyle, MD   Chief Complaint  Patient presents with  . Appointment    Follow-up coronary artery disease      History of Present Illness: Stephanie Alvarado is a 74 y.o. female who presents to follow-up coronary artery disease. She is not having any significant chest pain. She had neurosurgery on her back that helped some of her symptoms. She has multiple complaints today. She says that she's had episodes of yawning at nighttime. This can progressed to marked fatigue. Etiology is not clear. She also is having discomfort from her varicose veins.    Past Medical History  Diagnosis Date  . Hypertension   . CAD (coronary artery disease)     DES and circumflex 2006 /  DES to LAD 2011  . Hyperlipemia   . Hiatal hernia   . Esophageal reflux   . Lumbar disc disease   . Allergic rhinitis, cause unspecified   . Sleep related hypoventilation/hypoxemia in conditions classifiable elsewhere   . Shortness of breath   . Other diseases of lung, not elsewhere classified   . Chronic diastolic heart failure   . Peripheral vascular disease, unspecified   . Unspecified venous (peripheral) insufficiency   . Obesity, unspecified   . Cyst of thyroid   . Diverticulosis of colon (without mention of hemorrhage)   . Personal history of colonic polyps     ADENOMATOUS POLYP  . Osteoarthrosis, unspecified whether generalized or localized, unspecified site   . Lumbago   . Nonspecific abnormal finding in stool contents   . Unspecified cerebral artery occlusion with cerebral infarction   . Anxiety state, unspecified   . Anemia, unspecified   . Stroke 11/2008    Treated with TPA? /     2010, TEE no left atrial clot, probably from atherosclerosis of the aortic arch  . Ejection fraction      EF 60%, Echo, November 12, 2008, /  EF 55%, TEE, November 17, 2008  . Atherosclerosis  of aorta     TEE, June, 2010, grade 4 aortic arch atherosclerosis , Dr. Aundra Dubin  . Dysphasia     Possibly esophageal stricture  . Carotid artery disease     Doppler, November, 2011, stable, 0-39% bilateral, turbulent flow left subclavian  . Palpitations     October, 2012  . Subclavian artery disease     Remote surgery by Dr. Victorino Dike.  Marland Kitchen PFO (patent foramen ovale)     Patient had a very small PFO by TEE 2010.  This was seen only by bubble analysis during Valsalva  . MVP (mitral valve prolapse)   . Osteopenia   . Sleep apnea     Past Surgical History  Procedure Laterality Date  . Coronary angioplasty with stent placement  1990's  . Laparoscopic hysterectomy    . Urethral suspension  1993    Dr. Nori Riis  . Lumbar disc surgery      X 3  . Cholecystectomy    . Varicose vein surgery      x 2  . Cataracts      both eyes  . Back surgery      Patient Active Problem List   Diagnosis Date Noted  . Palpitations     Priority: High  . Subclavian artery disease     Priority: High  . PFO (  patent foramen ovale)     Priority: High  . CAD (coronary artery disease)     Priority: High  . Ejection fraction     Priority: High  . Atherosclerosis of aorta     Priority: High  . Carotid artery disease     Priority: High  . Lumbar scoliosis 11/21/2013  . Preop cardiovascular exam 10/29/2013  . LLQ abdominal pain 04/05/2013  . Diverticulitis 04/05/2013  . 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 CONSTIPATION 06/11/2010  . ABDOMINAL PAIN-LLQ 06/11/2010  . PERSONAL HX COLONIC POLYPS 06/11/2010  . SLEEP RELATED HYPOVENTILATION/HYPOXEMIA CCE 07/17/2009  . Stroke 11/11/2008  . COLONIC POLYPS 08/31/2008  . THYROID CYST 08/31/2008  . VENOUS INSUFFICIENCY 08/31/2008  . DIVERTICULOSIS OF COLON 08/31/2008  . LOW BACK PAIN, CHRONIC 08/31/2008  . 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 DYSPNEA 08/08/2008  . HYPERCHOLESTEROLEMIA 04/19/2007  . OBESITY 04/19/2007  . ANEMIA 04/19/2007  . ANXIETY 04/19/2007  .  HYPERTENSION 04/19/2007  . ALLERGIC RHINITIS 04/19/2007  . PULMONARY NODULE, RIGHT LOWER LOBE 04/19/2007  . GERD 04/19/2007  . DEGENERATIVE JOINT DISEASE 04/19/2007      Current Outpatient Prescriptions  Medication Sig Dispense Refill  . amLODipine (NORVASC) 5 MG tablet Take 5 mg by mouth daily.      Marland Kitchen atorvastatin (LIPITOR) 40 MG tablet Take 20 mg by mouth Daily.     Marland Kitchen BIOTIN PO Take 1 tablet by mouth daily.    . Cholecalciferol (VITAMIN D PO) Take 1 tablet by mouth daily.    . clopidogrel (PLAVIX) 75 MG tablet Take 1 tablet (75 mg total) by mouth daily. 90 tablet 3  . Cyanocobalamin (VITAMIN B-12 PO) Take 1 tablet by mouth daily.    . furosemide (LASIX) 40 MG tablet Take 40 mg by mouth daily as needed for edema.     . gabapentin (NEURONTIN) 600 MG tablet Take 600 mg by mouth 3 (three) times daily.    . isosorbide mononitrate (IMDUR) 30 MG 24 hr tablet Take 30 mg by mouth daily.    . metoprolol (LOPRESSOR) 50 MG tablet Take 50 mg by mouth 2 (two) times daily.     . Multiple Vitamins-Minerals (EYE VITAMINS) CAPS Take 1 capsule by mouth daily.    . nitroGLYCERIN (NITROSTAT) 0.4 MG SL tablet Place 1 tablet (0.4 mg total) under the tongue every 5 (five) minutes as needed. 25 tablet 3  . pantoprazole (PROTONIX) 40 MG tablet Take 40 mg by mouth at bedtime as needed (for indigestion).    . polyethylene glycol powder (GLYCOLAX/MIRALAX) powder Take 17 g by mouth daily.    Marland Kitchen zolpidem (AMBIEN) 10 MG tablet Take 1 tablet (10 mg total) by mouth at bedtime as needed. 30 tablet 0   No current facility-administered medications for this visit.    Allergies:   Codeine    Social History:  The patient  reports that she quit smoking about 16 years ago. Her smoking use included Cigarettes. She has a 40 pack-year smoking history. She has never used smokeless tobacco. She reports that she does not drink alcohol or use illicit drugs.   Family History:  The patient's family history includes Alzheimer's  disease in her mother; Breast cancer in an other family member; Colon cancer in her paternal aunt; Heart disease in her father. There is no history of Esophageal cancer, Rectal cancer, or Stomach cancer.    ROS:  Please see the history of present illness.     Patient denies fever, chills, headache, sweats, rash, change in  vision, change in hearing, chest pain, cough, nausea or vomiting, urinary symptoms. All other systems are reviewed and are negative.   PHYSICAL EXAM: VS:  BP 124/62 mmHg  Pulse 58  Ht 5\' 8"  (1.727 m)  Wt 192 lb 9.6 oz (87.363 kg)  BMI 29.29 kg/m2 , Patient is overweight. She is oriented to person time and place. Affect is normal. Head is atraumatic. Sclera and conjunctiva are normal. There is no jugular venous distention. Lungs are clear. Respiratory effort is not labored. Cardiac exam reveals S1 and S2. The abdomen is soft. There is no peripheral edema. She has marked varicose veins. There are no skin rashes.  EKG:   EKG is done today and reviewed by me. There is mild sinus bradycardia. There is no other significant change.   Recent Labs: 11/14/2013: BUN 17; Creatinine 0.83; Hemoglobin 13.8; Platelets 225; Potassium 4.7; Sodium 141    Lipid Panel    Component Value Date/Time   CHOL 201* 04/29/2010 0000   TRIG 128.0 04/29/2010 0000   TRIG 135 03/28/2006 0936   HDL 62.80 04/29/2010 0000   CHOLHDL 3 04/29/2010 0000   CHOLHDL 3.9 CALC 03/28/2006 0936   VLDL 25.6 04/29/2010 0000   LDLCALC * 11/12/2008 0520    102        Total Cholesterol/HDL:CHD Risk Coronary Heart Disease Risk Table                     Men   Women  1/2 Average Risk   3.4   3.3  Average Risk       5.0   4.4  2 X Average Risk   9.6   7.1  3 X Average Risk  23.4   11.0        Use the calculated Patient Ratio above and the CHD Risk Table to determine the patient's CHD Risk.        ATP III CLASSIFICATION (LDL):  <100     mg/dL   Optimal  100-129  mg/dL   Near or Above                     Optimal  130-159  mg/dL   Borderline  160-189  mg/dL   High  >190     mg/dL   Very High   LDLDIRECT 123.3 04/29/2010 0000      Wt Readings from Last 3 Encounters:  10/06/14 192 lb 9.6 oz (87.363 kg)  07/21/14 204 lb (92.534 kg)  12/04/13 194 lb (87.998 kg)      Current medicines are reviewed  The patient understands her medications.     ASSESSMENT AND PLAN:

## 2014-10-06 NOTE — Patient Instructions (Signed)
**Note De-Identified  Obfuscation** Medication Instructions:  None  Labwork: None  Testing/Procedures: None  Follow-Up: Your physician wants you to follow-up in: 1 year. You will receive a reminder letter in the mail two months in advance. If you don't receive a letter, please call our office to schedule the follow-up appointment.

## 2014-10-17 ENCOUNTER — Encounter: Payer: Self-pay | Admitting: Vascular Surgery

## 2014-10-21 ENCOUNTER — Encounter: Payer: Self-pay | Admitting: Vascular Surgery

## 2014-10-21 ENCOUNTER — Ambulatory Visit (INDEPENDENT_AMBULATORY_CARE_PROVIDER_SITE_OTHER): Payer: Medicare HMO | Admitting: Vascular Surgery

## 2014-10-21 VITALS — BP 114/71 | HR 83 | Ht 68.0 in | Wt 194.4 lb

## 2014-10-21 DIAGNOSIS — I83892 Varicose veins of left lower extremities with other complications: Secondary | ICD-10-CM

## 2014-10-21 DIAGNOSIS — I83899 Varicose veins of unspecified lower extremities with other complications: Secondary | ICD-10-CM | POA: Insufficient documentation

## 2014-10-21 NOTE — Progress Notes (Signed)
Subjective:     Patient ID: Stephanie Alvarado, female   DOB: 12-Mar-1941, 74 y.o.   MRN: [id number]  HPI this 74 year old female returns for further follow-up regarding her painful varicosities in the left leg. She has tried long-leg elastic compression stockings 20-30 mm gradient as well as elevation and ibuprofen without success. She continues to have aching throbbing and burning discomfort particular when she is on her feet. She has a history of laser ablation of the great saphenous veins several years ago but now has problems with her left small saphenous vein causing these bulging varicosities. These are affecting her daily living with pain almost constantly.  Past Medical History  Diagnosis Date  . Hypertension   . CAD (coronary artery disease)     DES and circumflex 2006 /  DES to LAD 2011  . Hyperlipemia   . Hiatal hernia   . Esophageal reflux   . Lumbar disc disease   . Allergic rhinitis, cause unspecified   . Sleep related hypoventilation/hypoxemia in conditions classifiable elsewhere   . Shortness of breath   . Other diseases of lung, not elsewhere classified   . Chronic diastolic heart failure   . Peripheral vascular disease, unspecified   . Unspecified venous (peripheral) insufficiency   . Obesity, unspecified   . Cyst of thyroid   . Diverticulosis of colon (without mention of hemorrhage)   . Personal history of colonic polyps     ADENOMATOUS POLYP  . Osteoarthrosis, unspecified whether generalized or localized, unspecified site   . Lumbago   . Nonspecific abnormal finding in stool contents   . Unspecified cerebral artery occlusion with cerebral infarction   . Anxiety state, unspecified   . Anemia, unspecified   . Stroke 11/2008    Treated with TPA? /     2010, TEE no left atrial clot, probably from atherosclerosis of the aortic arch  . Ejection fraction      EF 60%, Echo, November 12, 2008, /  EF 55%, TEE, November 17, 2008  . Atherosclerosis of aorta     TEE, June, 2010, grade 4  aortic arch atherosclerosis , Dr. Aundra Dubin  . Dysphasia     Possibly esophageal stricture  . Carotid artery disease     Doppler, November, 2011, stable, 0-39% bilateral, turbulent flow left subclavian  . Palpitations     October, 2012  . Subclavian artery disease     Remote surgery by Dr. Victorino Dike.  Marland Kitchen PFO (patent foramen ovale)     Patient had a very small PFO by TEE 2010.  This was seen only by bubble analysis during Valsalva  . MVP (mitral valve prolapse)   . Osteopenia   . Sleep apnea     History  Substance Use Topics  . Smoking status: Former Smoker -- 1.00 packs/day for 40 years    Types: Cigarettes    Quit date: 06/13/1998  . Smokeless tobacco: Never Used  . Alcohol Use: No    Family History  Problem Relation Age of Onset  . Heart disease Father   . Breast cancer      cousions  . Alzheimer's disease Mother   . Colon cancer Paternal Aunt   . Esophageal cancer Neg Hx   . Rectal cancer Neg Hx   . Stomach cancer Neg Hx     Allergies  Allergen Reactions  . Codeine Itching    REACTION: itch     Current outpatient prescriptions:  .  amLODipine (NORVASC) 5 MG tablet, Take  5 mg by mouth daily.  , Disp: , Rfl:  .  atorvastatin (LIPITOR) 40 MG tablet, Take 20 mg by mouth Daily. , Disp: , Rfl:  .  BIOTIN PO, Take 1 tablet by mouth daily., Disp: , Rfl:  .  Cholecalciferol (VITAMIN D PO), Take 1 tablet by mouth daily., Disp: , Rfl:  .  clopidogrel (PLAVIX) 75 MG tablet, Take 1 tablet (75 mg total) by mouth daily., Disp: 90 tablet, Rfl: 3 .  Cyanocobalamin (VITAMIN B-12 PO), Take by mouth daily., Disp: , Rfl:  .  furosemide (LASIX) 40 MG tablet, Take 40 mg by mouth daily as needed for edema. , Disp: , Rfl:  .  gabapentin (NEURONTIN) 600 MG tablet, Take 600 mg by mouth 3 (three) times daily., Disp: , Rfl:  .  isosorbide mononitrate (IMDUR) 30 MG 24 hr tablet, Take 30 mg by mouth daily., Disp: , Rfl:  .  metoprolol (LOPRESSOR) 50 MG tablet, Take 50 mg by mouth 2 (two) times  daily. , Disp: , Rfl:  .  Multiple Vitamins-Minerals (EYE VITAMINS) CAPS, Take 1 capsule by mouth daily., Disp: , Rfl:  .  nitroGLYCERIN (NITROSTAT) 0.4 MG SL tablet, Place 1 tablet (0.4 mg total) under the tongue every 5 (five) minutes as needed., Disp: 25 tablet, Rfl: 3 .  pantoprazole (PROTONIX) 40 MG tablet, Take 40 mg by mouth at bedtime as needed (for indigestion)., Disp: , Rfl:  .  polyethylene glycol powder (GLYCOLAX/MIRALAX) powder, Take 17 g by mouth daily., Disp: , Rfl:  .  zolpidem (AMBIEN) 10 MG tablet, Take 1 tablet (10 mg total) by mouth at bedtime as needed., Disp: 30 tablet, Rfl: 0 .  Cyanocobalamin (VITAMIN B-12 PO), Take 1 tablet by mouth daily., Disp: , Rfl:   Filed Vitals:   10/21/14 1257  BP: 114/71  Pulse: 83  Height: 5\' 8"  (1.727 m)  Weight: 194 lb 6.4 oz (88.179 kg)  SpO2: 94%    Body mass index is 29.57 kg/(m^2).           Review of Systems denies chest pain   but does complain of dyspnea on exertion, leg pain with walking, swelling in legs, and wheezing. Objective:   Physical Exam BP 114/71 mmHg  Pulse 83  Ht 5\' 8"  (1.727 m)  Wt 194 lb 6.4 oz (88.179 kg)  BMI 29.57 kg/m2  SpO2 94%  Gen. well-developed well-nourished female in no apparent distress alert and oriented 3 Lungs rhonchi or wheezing Left leg with large bulging varicosities in distal thigh into popliteal fossa and proximal calf extending laterally and a pretibial region with 1+ edema distally. 3+ to Sal[place] pedis pulse palpable.  Patient does have gross reflux in the left small saphenous vein supplying these bulging varicosities. I confirmed this with bedside sono site exam.     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 Symptoms are resistant to conservative measures including long-leg elastic compression stockings 20-30 mm gradient, elevation, and ibuprofen and are affecting patient's daily living    Plan:     Patient needs #1  laser ablation left small saphenous vein followed by #2 three-month waiting. 2 see if stab phlebectomy of these large painful varicosities will be indicated We'll proceed with precertification to perform this in the near future

## 2014-11-03 ENCOUNTER — Other Ambulatory Visit: Payer: Self-pay | Admitting: *Deleted

## 2014-11-03 DIAGNOSIS — I83892 Varicose veins of left lower extremities with other complications: Secondary | ICD-10-CM

## 2014-11-25 ENCOUNTER — Encounter: Payer: Self-pay | Admitting: Vascular Surgery

## 2014-12-01 ENCOUNTER — Ambulatory Visit (INDEPENDENT_AMBULATORY_CARE_PROVIDER_SITE_OTHER): Payer: Medicare HMO | Admitting: Vascular Surgery

## 2014-12-01 ENCOUNTER — Encounter: Payer: Self-pay | Admitting: Vascular Surgery

## 2014-12-01 VITALS — BP 134/76 | HR 65 | Resp 16 | Ht 68.0 in | Wt 195.0 lb

## 2014-12-01 DIAGNOSIS — I83892 Varicose veins of left lower extremities with other complications: Secondary | ICD-10-CM | POA: Diagnosis not present

## 2014-12-01 NOTE — Progress Notes (Signed)
Laser Ablation Procedure    Date: 12/01/2014   Stephanie Alvarado DOB:1940/08/16  Consent signed: Yes    Surgeon:  Stephanie Alvarado. Kellie Simmering  Procedure: Laser Ablation: left Greater Saphenous Vein  Dr. Kellie Simmering  BP 134/76 mmHg  Pulse 65  Resp 16  Ht 5\' 8"  (1.727 m)  Wt 195 lb (88.451 kg)  BMI 29.66 kg/m2  Tumescent Anesthesia: 250 cc 0.9% NaCl with 50 cc Lidocaine HCL with 1% Epi and 15 cc 8.4% NaHCO3  Local Anesthesia: 4 cc Lidocaine HCL and NaHCO3 (ratio 2:1)  Pulsed Mode: 15 watts, 565ms delay, 1.0 duration  Total Energy:1197              Total Pulses: 80               Total Time: 1:20     Patient tolerated procedure well  Notes:   Description of Procedure:  After marking the course of the secondary varicosities, the patient was placed on the operating table in the prone position, and the left leg was prepped and draped in sterile fashion.   Local anesthetic was administered and under ultrasound guidance the saphenous vein was accessed with a micro needle and guide wire; then the mirco puncture sheath was placed.  A guide wire was inserted saphenopopliteal junction , followed by a 5 french sheath.  The position of the sheath and then the laser fiber below the junction was confirmed using the ultrasound.  Tumescent anesthesia was administered along the course of the saphenous vein using ultrasound guidance. The patient was placed in Trendelenburg position and protective laser glasses were placed on patient and staff, and the laser was fired at 15 watts continuous mode advancing 1-34mm/second for a total of 1197 joules.     Steri strips were applied to the stab wounds and ABD pads and thigh high compression stockings were applied.  Ace wrap bandages were applied over the phlebectomy sites and at the top of the saphenopopliteal junction. Blood loss was less than 15 cc.  The patient ambulated out of the operating room having tolerated the procedure well.

## 2014-12-01 NOTE — Progress Notes (Signed)
Subjective:     Patient ID: Bonnielee Haff, female   DOB: Nov 27, 1940, 74 y.o.   MRN: [id number]  HPI this 74 year old female had laser ablation of the left small saphenous vein performed under local tumescent anesthesia for gross reflux. A total of 1197 J of energy was utilized. She tolerated the procedure well. She had painful varicosities.   Review of Systems     Objective:   Physical Exam BP 134/76 mmHg  Pulse 65  Resp 16  Ht 5\' 8"  (1.727 m)  Wt 195 lb (88.451 kg)  BMI 29.66 kg/m2       Assessment:     Well-tolerated laser ablation left small saphenous vein performed under local tumescent anesthesia for gross reflux with painful varicosities    Plan:     Return in 1 week for venous duplex exam to confirm closure left small saphenous vein. Patient then to return in 3 months to see if stab phlebectomy of these multiple painful varicosities will be indicated

## 2014-12-02 ENCOUNTER — Encounter: Payer: Self-pay | Admitting: Vascular Surgery

## 2014-12-02 ENCOUNTER — Telehealth: Payer: Self-pay | Admitting: *Deleted

## 2014-12-02 NOTE — Telephone Encounter (Signed)
Pt did well after the procedure but had pain around 5am. Took her Ibuprofen and is now doing better. Following all instructions. Reminded her of her fu appt.

## 2014-12-03 ENCOUNTER — Encounter: Payer: Self-pay | Admitting: Vascular Surgery

## 2014-12-08 ENCOUNTER — Ambulatory Visit (INDEPENDENT_AMBULATORY_CARE_PROVIDER_SITE_OTHER): Payer: Medicare HMO | Admitting: Vascular Surgery

## 2014-12-08 ENCOUNTER — Ambulatory Visit (HOSPITAL_COMMUNITY)
Admission: RE | Admit: 2014-12-08 | Discharge: 2014-12-08 | Disposition: A | Discharge status: Home / Self Care | Payer: Medicare HMO | Source: Ambulatory Visit | Attending: Vascular Surgery | Admitting: Vascular Surgery

## 2014-12-08 VITALS — BP 123/67 | HR 60 | Resp 16 | Ht 68.0 in | Wt 195.0 lb

## 2014-12-08 DIAGNOSIS — Z9889 Other specified postprocedural states: Secondary | ICD-10-CM | POA: Insufficient documentation

## 2014-12-08 DIAGNOSIS — I83892 Varicose veins of left lower extremities with other complications: Secondary | ICD-10-CM

## 2014-12-08 NOTE — Progress Notes (Signed)
Subjective:     Patient ID: Stephanie Alvarado, female   DOB: [birth year], 74 y.o.   MRN: [id number]  HPI this 74 year old female returns 1 week post-laser ablation left small saphenous vein for gross reflux with painful varicosities. She has had mild discomfort in the left posterior calf but states that the ibuprofen did help this quite a bit. She has worn her long leg elastic compression stocking as instructed she is ambulating without difficulty. She has no history of DVT or thrombophlebitis.  Past Medical History  Diagnosis Date  . Hypertension   . CAD (coronary artery disease)     DES and circumflex 2006 /  DES to LAD 2011  . Hyperlipemia   . Hiatal hernia   . Esophageal reflux   . Lumbar disc disease   . Allergic rhinitis, cause unspecified   . Sleep related hypoventilation/hypoxemia in conditions classifiable elsewhere   . Shortness of breath   . Other diseases of lung, not elsewhere classified   . Chronic diastolic heart failure   . Peripheral vascular disease, unspecified   . Unspecified venous (peripheral) insufficiency   . Obesity, unspecified   . Cyst of thyroid   . Diverticulosis of colon (without mention of hemorrhage)   . Personal history of colonic polyps     ADENOMATOUS POLYP  . Osteoarthrosis, unspecified whether generalized or localized, unspecified site   . Lumbago   . Nonspecific abnormal finding in stool contents   . Unspecified cerebral artery occlusion with cerebral infarction   . Anxiety state, unspecified   . Anemia, unspecified   . Stroke 11/2008    Treated with TPA? /     2010, TEE no left atrial clot, probably from atherosclerosis of the aortic arch  . Ejection fraction      EF 60%, Echo, November 12, 2008, /  EF 55%, TEE, November 17, 2008  . Atherosclerosis of aorta     TEE, June, 2010, grade 4 aortic arch atherosclerosis , Dr. Aundra Dubin  . Dysphasia     Possibly esophageal stricture  . Carotid artery disease     Doppler, November, 2011, stable, 0-39% bilateral,  turbulent flow left subclavian  . Palpitations     October, 2012  . Subclavian artery disease     Remote surgery by Dr. Victorino Dike.  Marland Kitchen PFO (patent foramen ovale)     Patient had a very small PFO by TEE 2010.  This was seen only by bubble analysis during Valsalva  . MVP (mitral valve prolapse)   . Osteopenia   . Sleep apnea     History  Substance Use Topics  . Smoking status: Former Smoker -- 1.00 packs/day for 40 years    Types: Cigarettes    Quit date: 06/13/1998  . Smokeless tobacco: Never Used  . Alcohol Use: No    Family History  Problem Relation Age of Onset  . Heart disease Father   . Breast cancer      cousions  . Alzheimer's disease Mother   . Colon cancer Paternal Aunt   . Esophageal cancer Neg Hx   . Rectal cancer Neg Hx   . Stomach cancer Neg Hx     Allergies  Allergen Reactions  . Codeine Itching    REACTION: itch     Current outpatient prescriptions:  .  amLODipine (NORVASC) 5 MG tablet, Take 5 mg by mouth daily.  , Disp: , Rfl:  .  atorvastatin (LIPITOR) 40 MG tablet, Take 20 mg by mouth Daily. ,  Disp: , Rfl:  .  BIOTIN PO, Take 1 tablet by mouth daily., Disp: , Rfl:  .  Cholecalciferol (VITAMIN D PO), Take 1 tablet by mouth daily., Disp: , Rfl:  .  clopidogrel (PLAVIX) 75 MG tablet, Take 1 tablet (75 mg total) by mouth daily., Disp: 90 tablet, Rfl: 3 .  Cyanocobalamin (VITAMIN B-12 PO), Take 1 tablet by mouth daily., Disp: , Rfl:  .  Cyanocobalamin (VITAMIN B-12 PO), Take by mouth daily., Disp: , Rfl:  .  furosemide (LASIX) 40 MG tablet, Take 40 mg by mouth daily as needed for edema. , Disp: , Rfl:  .  gabapentin (NEURONTIN) 600 MG tablet, Take 600 mg by mouth 3 (three) times daily., Disp: , Rfl:  .  isosorbide mononitrate (IMDUR) 30 MG 24 hr tablet, Take 30 mg by mouth daily., Disp: , Rfl:  .  metoprolol (LOPRESSOR) 50 MG tablet, Take 50 mg by mouth 2 (two) times daily. , Disp: , Rfl:  .  Multiple Vitamins-Minerals (EYE VITAMINS) CAPS, Take 1 capsule  by mouth daily., Disp: , Rfl:  .  nitroGLYCERIN (NITROSTAT) 0.4 MG SL tablet, Place 1 tablet (0.4 mg total) under the tongue every 5 (five) minutes as needed., Disp: 25 tablet, Rfl: 3 .  pantoprazole (PROTONIX) 40 MG tablet, Take 40 mg by mouth at bedtime as needed (for indigestion)., Disp: , Rfl:  .  polyethylene glycol powder (GLYCOLAX/MIRALAX) powder, Take 17 g by mouth daily., Disp: , Rfl:  .  zolpidem (AMBIEN) 10 MG tablet, Take 1 tablet (10 mg total) by mouth at bedtime as needed., Disp: 30 tablet, Rfl: 0  Filed Vitals:   12/08/14 1033  BP: 123/67  Pulse: 60  Resp: 16  Height: 5\' 8"  (1.727 m)  Weight: 195 lb (88.451 kg)    Body mass index is 29.66 kg/(m^2).           Review of Systems denies chest pain, dyspnea on exertion, PND, orthopnea, hemoptysis, claudication.     Objective:   Physical Exam BP 123/67 mmHg  Pulse 60  Resp 16  Ht 5\' 8"  (1.727 m)  Wt 195 lb (88.451 kg)  BMI 29.66 kg/m2  Gen. well-developed well-nourished female in no apparent distress alert and oriented 3 Lungs no rhonchi or wheezing Cardiovascular regular rhythm no murmurs Left leg with mild discomfort to palpation in the popliteal space and proximal calf posteriorly. Bulging varicosities still noted in the popliteal area but less tense than previously. 3+ posterior tibial pulse palpable distally.  Today I ordered a venous duplex exam in the left leg which I reviewed and interpreted. There is no DVT. There is total closure of the left small and great saphenous veins.     Assessment:     Successful laser ablation left small and great saphenous veins with painful varicosities left leg    Plan:     Return in 3 months to reevaluate to determine if stab phlebectomy of these residual varicosities will be indicated

## 2014-12-19 ENCOUNTER — Telehealth: Payer: Self-pay | Admitting: *Deleted

## 2014-12-19 NOTE — Telephone Encounter (Signed)
Returning Mrs. Hannold's phone call regarding "throbbing left leg pain that wakes her up at night."  Mrs. Nolton is s/p endovenous laser ablation left small saphenous vein on 12-01-2014 by Dr. Tinnie Gens.  Mrs. Hord states the left leg "throbbing pain"  starts "above my knee and goes down to my foot."  She states the left leg has awoken her the past two nights.  She denies left ankle or foot pain or swelling.  Reviewed the 12-08-2014 post laser ablation duplex with her which stated that the left small saphenous vein was successfully closed/ablated and no mention of DVT.  Mrs. Eads reassured.  Advised her to keep her left leg elevated when she sleeps and to take Ibuprofen 400-600 mg with a heavy snack before bedtime.  Encouraged her to call [facility] if symptoms worsened or if see developed left foot or ankle pain or swelling or for any questions or concerns.

## 2014-12-23 ENCOUNTER — Encounter: Payer: Self-pay | Admitting: Gastroenterology

## 2015-03-09 ENCOUNTER — Encounter: Payer: Self-pay | Admitting: Vascular Surgery

## 2015-03-10 ENCOUNTER — Encounter: Payer: Self-pay | Admitting: Vascular Surgery

## 2015-03-10 ENCOUNTER — Ambulatory Visit (INDEPENDENT_AMBULATORY_CARE_PROVIDER_SITE_OTHER): Payer: Medicare HMO | Admitting: Vascular Surgery

## 2015-03-10 VITALS — BP 118/58 | HR 65 | Ht 68.0 in | Wt 191.3 lb

## 2015-03-10 DIAGNOSIS — I83892 Varicose veins of left lower extremities with other complications: Secondary | ICD-10-CM

## 2015-03-10 NOTE — Progress Notes (Signed)
Subjective:     Patient ID: Stephanie Alvarado, female   DOB: 01-12-1941, 74 y.o.   MRN: [id number]  HPI this 74 year old female returns 3 months post laser ablation left small saphenous vein for painful varicosities due to gross reflux. Patient had previously undergone left great saphenous vein stripping many years ago. She continues to have aching throbbing and burning discomfort in these varicosities which are located in the posterior thigh and extend laterally into the pretibial region. They are less tense than they were previously been continued to be symptomatic. She is tried long-leg elastic compression stockings as well as elevation and ibuprofen with no success.  Past Medical History  Diagnosis Date  . Hypertension   . CAD (coronary artery disease)     DES and circumflex 2006 /  DES to LAD 2011  . Hyperlipemia   . Hiatal hernia   . Esophageal reflux   . Lumbar disc disease   . Allergic rhinitis, cause unspecified   . Sleep related hypoventilation/hypoxemia in conditions classifiable elsewhere   . Shortness of breath   . Other diseases of lung, not elsewhere classified   . Chronic diastolic heart failure   . Peripheral vascular disease, unspecified   . Unspecified venous (peripheral) insufficiency   . Obesity, unspecified   . Cyst of thyroid   . Diverticulosis of colon (without mention of hemorrhage)   . Personal history of colonic polyps     ADENOMATOUS POLYP  . Osteoarthrosis, unspecified whether generalized or localized, unspecified site   . Lumbago   . Nonspecific abnormal finding in stool contents   . Unspecified cerebral artery occlusion with cerebral infarction   . Anxiety state, unspecified   . Anemia, unspecified   . Stroke 11/2008    Treated with TPA? /     2010, TEE no left atrial clot, probably from atherosclerosis of the aortic arch  . Ejection fraction      EF 60%, Echo, November 12, 2008, /  EF 55%, TEE, November 17, 2008  . Atherosclerosis of aorta     TEE, June, 2010,  grade 4 aortic arch atherosclerosis , Dr. Aundra Dubin  . Dysphasia     Possibly esophageal stricture  . Carotid artery disease     Doppler, November, 2011, stable, 0-39% bilateral, turbulent flow left subclavian  . Palpitations     October, 2012  . Subclavian artery disease     Remote surgery by Dr. Victorino Dike.  Marland Kitchen PFO (patent foramen ovale)     Patient had a very small PFO by TEE 2010.  This was seen only by bubble analysis during Valsalva  . MVP (mitral valve prolapse)   . Osteopenia   . Sleep apnea     Social History  Substance Use Topics  . Smoking status: Former Smoker -- 1.00 packs/day for 40 years    Types: Cigarettes    Quit date: 06/13/1998  . Smokeless tobacco: Never Used  . Alcohol Use: No    Family History  Problem Relation Age of Onset  . Heart disease Father   . Breast cancer      cousions  . Alzheimer's disease Mother   . Colon cancer Paternal Aunt   . Esophageal cancer Neg Hx   . Rectal cancer Neg Hx   . Stomach cancer Neg Hx     Allergies  Allergen Reactions  . Codeine Itching    REACTION: itch     Current outpatient prescriptions:  .  amLODipine (NORVASC) 5 MG tablet, Take  5 mg by mouth daily.  , Disp: , Rfl:  .  atorvastatin (LIPITOR) 40 MG tablet, Take 20 mg by mouth Daily. , Disp: , Rfl:  .  BIOTIN PO, Take 1 tablet by mouth daily., Disp: , Rfl:  .  Cholecalciferol (VITAMIN D PO), Take 1 tablet by mouth daily., Disp: , Rfl:  .  clopidogrel (PLAVIX) 75 MG tablet, Take 1 tablet (75 mg total) by mouth daily., Disp: 90 tablet, Rfl: 3 .  Cyanocobalamin (VITAMIN B-12 PO), Take 1 tablet by mouth daily., Disp: , Rfl:  .  Cyanocobalamin (VITAMIN B-12 PO), Take by mouth daily., Disp: , Rfl:  .  furosemide (LASIX) 40 MG tablet, Take 40 mg by mouth daily as needed for edema. , Disp: , Rfl:  .  gabapentin (NEURONTIN) 600 MG tablet, Take 600 mg by mouth 3 (three) times daily., Disp: , Rfl:  .  isosorbide mononitrate (IMDUR) 30 MG 24 hr tablet, Take 30 mg by mouth  daily., Disp: , Rfl:  .  metoprolol (LOPRESSOR) 50 MG tablet, Take 50 mg by mouth 2 (two) times daily. , Disp: , Rfl:  .  Multiple Vitamins-Minerals (EYE VITAMINS) CAPS, Take 1 capsule by mouth daily., Disp: , Rfl:  .  nitroGLYCERIN (NITROSTAT) 0.4 MG SL tablet, Place 1 tablet (0.4 mg total) under the tongue every 5 (five) minutes as needed., Disp: 25 tablet, Rfl: 3 .  pantoprazole (PROTONIX) 40 MG tablet, Take 40 mg by mouth at bedtime as needed (for indigestion)., Disp: , Rfl:  .  polyethylene glycol powder (GLYCOLAX/MIRALAX) powder, Take 17 g by mouth daily., Disp: , Rfl:  .  zolpidem (AMBIEN) 10 MG tablet, Take 1 tablet (10 mg total) by mouth at bedtime as needed., Disp: 30 tablet, Rfl: 0  Filed Vitals:   03/10/15 1050  BP: 118/58  Pulse: 65  Height: 5\' 8"  (1.727 m)  Weight: 191 lb 4.8 oz (86.773 kg)  SpO2: 95%    Body mass index is 29.09 kg/(m^2).           Review of Systems denies chest pain, dyspnea on exertion, PND, orthopnea, hemoptysis, claudication     Objective:   Physical Exam BP 118/58 mmHg  Pulse 65  Ht 5\' 8"  (1.727 m)  Wt 191 lb 4.8 oz (86.773 kg)  BMI 29.09 kg/m2  SpO2 95%  Gen. well-developed well-nourished female in no apparent distress alert and oriented 3 Lungs no rhonchi or wheezing Left leg with bulging varicosities posterior distal thigh extending laterally and into the pretibial region with no distal edema noted. 3+ dorsalis pedis pulse palpable.     Assessment:     Painful varicosities left leg-status post laser ablation left small saphenous vein and remote history of vein stripping left great saphenous vein. Patient's symptoms are not responding to conservative measures including long-leg elastic compression stockings, elevation, and ibuprofen.    Plan:     Patient needs multiple stab phlebectomy of painful varicosities left leg-greater than 20 to hopefully relieve her residual symptoms We will proceed with precertification to perform  this in the near future

## 2015-03-10 NOTE — Patient Instructions (Signed)
Varicose Veins Varicose veins are veins that have become enlarged and twisted. CAUSES This condition is the result of valves in the veins not working properly. Valves in the veins help return blood from the leg to the heart. If these valves are damaged, blood flows backwards and backs up into the veins in the leg near the skin. This causes the veins to become larger. People who are on their feet a lot, who are pregnant, or who are overweight are more likely to develop varicose veins. SYMPTOMS   Bulging, twisted-appearing, bluish veins, most commonly found on the legs.  Leg pain or a feeling of heaviness. These symptoms may be worse at the end of the day.  Leg swelling.  Skin color changes. DIAGNOSIS  Varicose veins can usually be diagnosed with an exam of your legs by your caregiver. He or she may recommend an ultrasound of your leg veins. TREATMENT  Most varicose veins can be treated at home.However, other treatments are available for people who have persistent symptoms or who want to treat the cosmetic appearance of the varicose veins. These include:  Laser treatment of very small varicose veins.  Medicine that is shot (injected) into the vein. This medicine hardens the walls of the vein and closes off the vein. This treatment is called sclerotherapy. Afterwards, you may need to wear clothing or bandages that apply pressure.  Surgery. HOME CARE INSTRUCTIONS   Do not stand or sit in one position for long periods of time. Do not sit with your legs crossed. Rest with your legs raised during the day.  Wear elastic stockings or support hose. Do not wear other tight, encircling garments around the legs, pelvis, or waist.  Walk as much as possible to increase blood flow.  Raise the foot of your bed at night with 2-inch blocks.  If you get a cut in the skin over the vein and the vein bleeds, lie down with your leg raised and press on it with a clean cloth until the bleeding stops. Then  place a bandage (dressing) on the cut. See your caregiver if it continues to bleed or needs stitches. SEEK MEDICAL CARE IF:   The skin around your ankle starts to break down.  You have pain, redness, tenderness, or hard swelling developing in your leg over a vein.  You are uncomfortable due to leg pain.   Endovenous Ablation Endovenous ablation is a procedure that seals off an abnormally enlarged leg vein (varicose vein). This procedure uses heat from radiofrequency waves or a laser to seal off the affected vein. This procedure may be done if the vein is causing pain, swelling, sores on the skin (ulcers), or skin discoloration. LET YOUR CAREGIVER KNOW ABOUT:   Allergies to food or medicine.  Medicines taken, including vitamins, herbs, over-the-counter medicines, and creams.  Use of steroids (by mouth or creams).  Previous problems with anesthetics or numbing medicines.  History of bleeding problems or blood clots.  Previous surgery.  Other health problems, including diabetes and kidney problems.  Possibility of pregnancy, if this applies. RISKS AND COMPLICATIONS   Bleeding.  Infection.  Numbness or tingling along the leg. This is uncommon and is usually temporary.  Vein swelling. This is usually temporary. BEFORE THE PROCEDURE   Blood tests may be done to make sure your blood clots normally.  You may need to stop taking blood thinners, aspirin, and nonsteroidal anti-inflammatory drugs (such as ibuprofen) before the procedure. Ask your caregiver when you can start taking  them again.  Follow your caregiver's diet instructions. You may need to stop eating and drinking for 4 hours before the procedure. PROCEDURE  This procedure usually lasts about 1 hour.  You will lie on an exam table. Your leg will be cleaned and shaved.  Ultrasonography will be used to help your caregiver see the leg veins during the procedure.  You will be given medicine that numbs the area (local  anesthetic).  A tiny cut (incision) is made close to the area that will be treated. A narrow tube (catheter) is then slipped through the incision and into the vein.  Tiny sensors (electrodes or laser fibers) are slipped through the catheter and into the vein.  Radiofrequency or laser energy is sent through the sensors to burn the vein, sealing it off.  The electrodes, laser fibers, and catheter are then removed from the vein.  A bandage is placed over the incision. AFTER THE PROCEDURE  You will wear a compression stocking on your leg. This helps to reduce bruising and prevent blood clots from forming.  You will be encouraged to walk around immediately after the procedure.  Arrange for someone to drive you home after the procedure. Document Released: 05/19/2011 Document Revised: 08/22/2011 Document Reviewed: 05/19/2011 [facility] Patient Information 2015 [place], [place]. This information is not intended to replace advice given to you by your health care provider. Make sure you discuss any questions you have with your health care provider.

## 2015-04-27 ENCOUNTER — Other Ambulatory Visit: Payer: Medicare HMO | Admitting: Vascular Surgery

## 2015-05-05 ENCOUNTER — Encounter: Payer: Self-pay | Admitting: Vascular Surgery

## 2015-05-11 ENCOUNTER — Ambulatory Visit (INDEPENDENT_AMBULATORY_CARE_PROVIDER_SITE_OTHER): Payer: Commercial Managed Care - HMO | Admitting: Vascular Surgery

## 2015-05-11 ENCOUNTER — Encounter: Payer: Self-pay | Admitting: Vascular Surgery

## 2015-05-11 ENCOUNTER — Other Ambulatory Visit: Payer: Medicare HMO | Admitting: Vascular Surgery

## 2015-05-11 VITALS — BP 133/74 | HR 67 | Temp 97.8°F | Resp 16 | Ht 68.0 in | Wt 190.0 lb

## 2015-05-11 DIAGNOSIS — I83892 Varicose veins of left lower extremities with other complications: Secondary | ICD-10-CM

## 2015-05-11 NOTE — Progress Notes (Signed)
    Stab Phlebectomy Procedure  Stephanie Alvarado DOB:09-Aug-1940  05/11/2015  Consent signed: Yes  Surgeon:J.D. Kellie Simmering  Procedure: stab phlebectomy: left leg  BP 133/74 mmHg  Pulse 67  Temp(Src) 97.8 F (36.6 C)  Resp 16  Ht 5\' 8"  (1.727 m)  Wt 190 lb (86.183 kg)  BMI 28.90 kg/m2  SpO2 99%  Start time: 1pm   End time: 1:50   Tumescent Anesthesia: 175 cc 0.9% NaCl with 50 cc Lidocaine HCL with 1% Epi and 15 cc 8.4% NaHCO3  Local Anesthesia: 6 cc Lidocaine HCL and NaHCO3 (ratio 2:1)    Stab Phlebectomy: >20 Sites: Thigh and Calf  Patient tolerated procedure well: Yes  Notes:   Description of Procedure:  After marking the course of the secondary varicosities, the patient was placed on the operating table in the supine position, and the left leg was prepped and draped in sterile fashion.    The patient was then put into Trendelenburg position.  Local anesthetic was administered at the previously marked varicosities, and tumescent anesthesia was administered around the vessels.  Greater than 20 stab wounds were made using the tip of an 11 blade. And using the vein hook, the phlebectomies were performed using a hemostat to avulse the varicosities.  Adequate hemostasis was achieved, and steri strips were applied to the stab wound.     ABD pads and thigh high compression stockings were applied as well ace wraps where needed. Blood loss was less than 15 cc.  The patient ambulated out of the operating room having tolerated the procedure well.

## 2015-05-11 NOTE — Progress Notes (Signed)
Subjective:     Patient ID: Bonnielee Haff, female   DOB: 03-03-1941, 74 y.o.   MRN: [id number]  HPI  This 74 year old female had multiple stab phlebectomy-greater than 20 left leg of painful varicosities performed under local tumescent anesthesia. She tolerated the procedure well. Review of Systems     Objective:   Physical Exam BP 133/74 mmHg  Pulse 67  Temp(Src) 97.8 F (36.6 C)  Resp 16  Ht 5\' 8"  (1.727 m)  Wt 190 lb (86.183 kg)  BMI 28.90 kg/m2  SpO2 99%       Assessment:     Well-tolerated multiple stab phlebectomy of painful varicosities performed under local tumescent anesthesia-greater than 20    Plan:     Return in 3-4 months for final follow-up

## 2015-05-12 ENCOUNTER — Telehealth: Payer: Self-pay | Admitting: *Deleted

## 2015-05-12 NOTE — Telephone Encounter (Signed)
Pt doing well. No bleeding from stab sites. Following all instructions. Did feel some stinging at times. Follow prn.

## 2015-07-03 ENCOUNTER — Other Ambulatory Visit: Payer: Self-pay | Admitting: Obstetrics & Gynecology

## 2015-07-03 ENCOUNTER — Telehealth: Payer: Self-pay | Admitting: *Deleted

## 2015-07-03 DIAGNOSIS — R928 Other abnormal and inconclusive findings on diagnostic imaging of breast: Secondary | ICD-10-CM

## 2015-07-03 NOTE — Telephone Encounter (Signed)
Patient called c/o pain in her left leg.  She states that since her stab phlebectomy in November 2016 she has had pain.  She suffered a fall three weeks ago and since that time it has worsened.  The pain is mainly behind her left knee. Patient denies any warmth or redness but states that it "may feel a bit puffy".  She states that at night she is applying ice/heat. I spoke to Dr Bridgett Larsson and he suggested that patient be scheduled for an ultrasound.  I called patient back and explained this and she states that she has no problem waiting until Monday where she could speak to Kathlee Nations, RN and have her to be advised by Dr Kellie Simmering.

## 2015-07-06 ENCOUNTER — Telehealth: Payer: Self-pay | Admitting: *Deleted

## 2015-07-06 NOTE — Telephone Encounter (Signed)
Pt had called in last week. She fell three weeks ago and is having pain behind the left knee. No ankle or foot swelling. She likes ice better than heat and is taking Ibuprofen. I told he this did not sound like a vein issue and encouraged her to go see an orthopedic MD. She expressed understanding and said she would do that. Will follow prn.

## 2015-07-10 ENCOUNTER — Ambulatory Visit
Admission: RE | Admit: 2015-07-10 | Discharge: 2015-07-10 | Disposition: A | Discharge status: Home / Self Care | Payer: PPO | Source: Ambulatory Visit | Attending: Obstetrics & Gynecology | Admitting: Obstetrics & Gynecology

## 2015-07-10 DIAGNOSIS — R928 Other abnormal and inconclusive findings on diagnostic imaging of breast: Secondary | ICD-10-CM

## 2015-07-10 DIAGNOSIS — N63 Unspecified lump in breast: Secondary | ICD-10-CM | POA: Diagnosis not present

## 2015-07-28 DIAGNOSIS — M25562 Pain in left knee: Secondary | ICD-10-CM | POA: Diagnosis not present

## 2015-07-28 DIAGNOSIS — S63617A Unspecified sprain of left little finger, initial encounter: Secondary | ICD-10-CM | POA: Diagnosis not present

## 2015-07-31 ENCOUNTER — Encounter: Payer: Self-pay | Admitting: Vascular Surgery

## 2015-08-11 ENCOUNTER — Encounter: Payer: Self-pay | Admitting: Vascular Surgery

## 2015-08-11 ENCOUNTER — Ambulatory Visit (INDEPENDENT_AMBULATORY_CARE_PROVIDER_SITE_OTHER): Payer: PPO | Admitting: Vascular Surgery

## 2015-08-11 VITALS — BP 146/70 | HR 64 | Temp 97.0°F | Resp 16 | Ht 68.0 in | Wt 194.0 lb

## 2015-08-11 DIAGNOSIS — N183 Chronic kidney disease, stage 3 (moderate): Secondary | ICD-10-CM | POA: Diagnosis not present

## 2015-08-11 DIAGNOSIS — I10 Essential (primary) hypertension: Secondary | ICD-10-CM | POA: Diagnosis not present

## 2015-08-11 DIAGNOSIS — I83892 Varicose veins of left lower extremities with other complications: Secondary | ICD-10-CM | POA: Diagnosis not present

## 2015-08-11 DIAGNOSIS — K219 Gastro-esophageal reflux disease without esophagitis: Secondary | ICD-10-CM | POA: Diagnosis not present

## 2015-08-11 DIAGNOSIS — E039 Hypothyroidism, unspecified: Secondary | ICD-10-CM | POA: Diagnosis not present

## 2015-08-11 DIAGNOSIS — E782 Mixed hyperlipidemia: Secondary | ICD-10-CM | POA: Diagnosis not present

## 2015-08-11 DIAGNOSIS — R5383 Other fatigue: Secondary | ICD-10-CM | POA: Diagnosis not present

## 2015-08-11 NOTE — Progress Notes (Signed)
Subjective:     Patient ID: Stephanie Alvarado, female   DOB: 10/22/1940, 75 y.o.   MRN: [id number]  HPI This 75 year old female returns complaining of some pain behind the left knee. She had multiple stab phlebectomy of painful varicosities performed by me in November 2016 having previously undergone laser ablation of the left small saphenous vein in June 2016. She had had left great saphenous vein ablation many years ago. She has noticed some mild distal edema. She has been experiencing discomfort just above the knee posteriorly in the distal thigh laterally which has then significant over the past several weeks. She has a history of DVT thrombophlebitis stasis ulcers with bleeding. She does not really compression stockings present time.  Past Medical History  Diagnosis Date  . Hypertension   . CAD (coronary artery disease)     DES and circumflex 2006 /  DES to LAD 2011  . Hyperlipemia   . Hiatal hernia   . Esophageal reflux   . Lumbar disc disease   . Allergic rhinitis, cause unspecified   . Sleep related hypoventilation/hypoxemia in conditions classifiable elsewhere   . Shortness of breath   . Other diseases of lung, not elsewhere classified   . Chronic diastolic heart failure ([place])   . Peripheral vascular disease, unspecified ([place])   . Unspecified venous (peripheral) insufficiency   . Obesity, unspecified   . Cyst of thyroid   . Diverticulosis of colon (without mention of hemorrhage)   . Personal history of colonic polyps     ADENOMATOUS POLYP  . Osteoarthrosis, unspecified whether generalized or localized, unspecified site   . Lumbago   . Nonspecific abnormal finding in stool contents   . Unspecified cerebral artery occlusion with cerebral infarction   . Anxiety state, unspecified   . Anemia, unspecified   . Stroke [facility]) 11/2008    Treated with TPA? /     2010, TEE no left atrial clot, probably from atherosclerosis of the aortic arch  . Ejection fraction      EF 60%, Echo, November 12, 2008, /  EF 55%, TEE, November 17, 2008  . Atherosclerosis of aorta ([place])     TEE, June, 2010, grade 4 aortic arch atherosclerosis , Dr. Aundra Dubin  . Dysphasia     Possibly esophageal stricture  . Carotid artery disease ([place])     Doppler, November, 2011, stable, 0-39% bilateral, turbulent flow left subclavian  . Palpitations     October, 2012  . 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 Victorino Dike.  Marland Kitchen PFO (patent foramen ovale)     Patient had a very small PFO by TEE 2010.  This was seen only by bubble analysis during Valsalva  . MVP (mitral valve prolapse)   . Osteopenia   . Sleep apnea     Social History  Substance Use Topics  . Smoking status: Former Smoker -- 1.00 packs/day for 40 years    Types: Cigarettes    Quit date: 06/13/1998  . Smokeless tobacco: Never Used  . Alcohol Use: No    Family History  Problem Relation Age of Onset  . Heart disease Father   . Breast cancer      cousions  . Alzheimer's disease Mother   . Colon cancer Paternal Aunt   . Esophageal cancer Neg Hx   . Rectal cancer Neg Hx   . Stomach cancer Neg Hx     Allergies  Allergen Reactions  . Codeine Itching  REACTION: itch     Current outpatient prescriptions:  .  amLODipine (NORVASC) 5 MG tablet, Take 5 mg by mouth daily.  , Disp: , Rfl:  .  atorvastatin (LIPITOR) 40 MG tablet, Take 20 mg by mouth Daily. , Disp: , Rfl:  .  BIOTIN PO, Take 1 tablet by mouth daily., Disp: , Rfl:  .  Cholecalciferol (VITAMIN D PO), Take 1 tablet by mouth daily., Disp: , Rfl:  .  clopidogrel (PLAVIX) 75 MG tablet, Take 1 tablet (75 mg total) by mouth daily., Disp: 90 tablet, Rfl: 3 .  Cyanocobalamin (VITAMIN B-12 PO), Take 1 tablet by mouth daily., Disp: , Rfl:  .  Cyanocobalamin (VITAMIN B-12 PO), Take by mouth daily., Disp: , Rfl:  .  furosemide (LASIX) 40 MG tablet, Take 40 mg by mouth daily as needed for edema. , Disp: , Rfl:  .  gabapentin (NEURONTIN) 600 MG tablet, Take 600 mg by mouth 3  (three) times daily., Disp: , Rfl:  .  isosorbide mononitrate (IMDUR) 30 MG 24 hr tablet, Take 30 mg by mouth daily., Disp: , Rfl:  .  metoprolol (LOPRESSOR) 50 MG tablet, Take 50 mg by mouth 2 (two) times daily. , Disp: , Rfl:  .  Multiple Vitamins-Minerals (EYE VITAMINS) CAPS, Take 1 capsule by mouth daily., Disp: , Rfl:  .  nitroGLYCERIN (NITROSTAT) 0.4 MG SL tablet, Place 1 tablet (0.4 mg total) under the tongue every 5 (five) minutes as needed., Disp: 25 tablet, Rfl: 3 .  pantoprazole (PROTONIX) 40 MG tablet, Take 40 mg by mouth at bedtime as needed (for indigestion)., Disp: , Rfl:  .  polyethylene glycol powder (GLYCOLAX/MIRALAX) powder, Take 17 g by mouth daily., Disp: , Rfl:  .  zolpidem (AMBIEN) 10 MG tablet, Take 1 tablet (10 mg total) by mouth at bedtime as needed., Disp: 30 tablet, Rfl: 0  Filed Vitals:   08/11/15 1236  BP: 146/70  Pulse: 64  Temp: 97 F (36.1 C)  Resp: 16  Height: 5\' 8"  (1.727 m)  Weight: 194 lb (87.998 kg)  SpO2: 99%    Body mass index is 29.5 kg/(m^2).           Review of Systems Denies chest pain, dyspnea on exertion, PND, orthopnea , hemoptysis. Does have occasional wheezing.     Objective:   Physical Exam BP 146/70 mmHg  Pulse 64  Temp(Src) 97 F (36.1 C)  Resp 16  Ht 5\' 8"  (1.727 m)  Wt 194 lb (87.998 kg)  BMI 29.50 kg/m2  SpO2 99%   a well-developed well-nourished female in no apparent distress alert and oriented 3 Lungs no rhonchi or wheezing  Cardio vascular regular rhythm no murmurs  left leg with spider and reticular veins anteriorly and posteriorly particularly below the knee and in the popliteal fossa. Patient has point tenderness in the lateral distal thigh posteriorly just above the flexion crease in the popliteal fossa. No varicosity is noted in this area. 1+ edema distally. 3+ dorsalis pedis pulse palpable. No hyperpigmentation or ulceration noted.     Assessment:      tenderness left distal thigh laterally just  proximal to popliteal crease -etiology unknown  No evidence of arterial or venous abnormalities in this area  Status post laser ablation left small saphenous vein with multiple stab phlebectomy of painful varicosities over the past 8 months     Plan:      elevate foot of bed 2-3 inches  need to wear short leg elastic compression  stockings during the day to control edema  would treat discomfort with local heat and analgesics -I do not know the etiology of her pain

## 2015-08-18 DIAGNOSIS — J449 Chronic obstructive pulmonary disease, unspecified: Secondary | ICD-10-CM | POA: Diagnosis not present

## 2015-08-18 DIAGNOSIS — N183 Chronic kidney disease, stage 3 (moderate): Secondary | ICD-10-CM | POA: Diagnosis not present

## 2015-08-18 DIAGNOSIS — K219 Gastro-esophageal reflux disease without esophagitis: Secondary | ICD-10-CM | POA: Diagnosis not present

## 2015-08-18 DIAGNOSIS — E782 Mixed hyperlipidemia: Secondary | ICD-10-CM | POA: Diagnosis not present

## 2015-08-18 DIAGNOSIS — K5732 Diverticulitis of large intestine without perforation or abscess without bleeding: Secondary | ICD-10-CM | POA: Diagnosis not present

## 2015-08-18 DIAGNOSIS — Z0001 Encounter for general adult medical examination with abnormal findings: Secondary | ICD-10-CM | POA: Diagnosis not present

## 2015-08-18 DIAGNOSIS — I8391 Asymptomatic varicose veins of right lower extremity: Secondary | ICD-10-CM | POA: Diagnosis not present

## 2015-08-18 DIAGNOSIS — I25111 Atherosclerotic heart disease of native coronary artery with angina pectoris with documented spasm: Secondary | ICD-10-CM | POA: Diagnosis not present

## 2015-08-18 DIAGNOSIS — F331 Major depressive disorder, recurrent, moderate: Secondary | ICD-10-CM | POA: Diagnosis not present

## 2015-08-18 DIAGNOSIS — I10 Essential (primary) hypertension: Secondary | ICD-10-CM | POA: Diagnosis not present

## 2015-08-18 DIAGNOSIS — M545 Low back pain: Secondary | ICD-10-CM | POA: Diagnosis not present

## 2015-08-18 DIAGNOSIS — E039 Hypothyroidism, unspecified: Secondary | ICD-10-CM | POA: Diagnosis not present

## 2015-09-29 DIAGNOSIS — M7062 Trochanteric bursitis, left hip: Secondary | ICD-10-CM | POA: Diagnosis not present

## 2015-09-29 DIAGNOSIS — Z981 Arthrodesis status: Secondary | ICD-10-CM | POA: Diagnosis not present

## 2015-09-29 DIAGNOSIS — M25562 Pain in left knee: Secondary | ICD-10-CM | POA: Diagnosis not present

## 2015-10-01 ENCOUNTER — Other Ambulatory Visit: Payer: Self-pay | Admitting: Obstetrics & Gynecology

## 2015-10-01 DIAGNOSIS — N6489 Other specified disorders of breast: Secondary | ICD-10-CM

## 2015-10-07 ENCOUNTER — Ambulatory Visit
Admission: RE | Admit: 2015-10-07 | Discharge: 2015-10-07 | Disposition: A | Discharge status: Home / Self Care | Payer: PPO | Source: Ambulatory Visit | Attending: Obstetrics & Gynecology | Admitting: Obstetrics & Gynecology

## 2015-10-07 DIAGNOSIS — N6489 Other specified disorders of breast: Secondary | ICD-10-CM | POA: Diagnosis not present

## 2015-10-09 DIAGNOSIS — G8929 Other chronic pain: Secondary | ICD-10-CM | POA: Diagnosis not present

## 2015-10-09 DIAGNOSIS — M25562 Pain in left knee: Secondary | ICD-10-CM | POA: Diagnosis not present

## 2015-10-15 DIAGNOSIS — M238X2 Other internal derangements of left knee: Secondary | ICD-10-CM | POA: Diagnosis not present

## 2015-11-10 ENCOUNTER — Ambulatory Visit (INDEPENDENT_AMBULATORY_CARE_PROVIDER_SITE_OTHER): Payer: PPO | Admitting: Physician Assistant

## 2015-11-10 ENCOUNTER — Encounter: Payer: Self-pay | Admitting: Physician Assistant

## 2015-11-10 VITALS — BP 140/70 | HR 63 | Ht 68.0 in | Wt 195.0 lb

## 2015-11-10 DIAGNOSIS — I83892 Varicose veins of left lower extremities with other complications: Secondary | ICD-10-CM

## 2015-11-10 DIAGNOSIS — E7800 Pure hypercholesterolemia, unspecified: Secondary | ICD-10-CM

## 2015-11-10 DIAGNOSIS — I700 Atherosclerosis of aorta: Secondary | ICD-10-CM | POA: Diagnosis not present

## 2015-11-10 DIAGNOSIS — R079 Chest pain, unspecified: Secondary | ICD-10-CM

## 2015-11-10 DIAGNOSIS — I2510 Atherosclerotic heart disease of native coronary artery without angina pectoris: Secondary | ICD-10-CM | POA: Diagnosis not present

## 2015-11-10 DIAGNOSIS — Z01818 Encounter for other preprocedural examination: Secondary | ICD-10-CM

## 2015-11-10 DIAGNOSIS — I739 Peripheral vascular disease, unspecified: Secondary | ICD-10-CM

## 2015-11-10 DIAGNOSIS — I779 Disorder of arteries and arterioles, unspecified: Secondary | ICD-10-CM

## 2015-11-10 NOTE — Progress Notes (Signed)
Cardiology Office Note    Date:  11/10/2015   ID:  Stephanie Alvarado, DOB 1940-12-04, MRN [id number]  PCP:  Gar Ponto, MD  Cardiologist:  Previous Dr. Ron Parker, requests Dr. Burt Knack  [id number] pain  History of Present Illness:  Stephanie Alvarado is a 75 y.o. female with history of CAD S/P DES Cfx 2006/DES LAD 2011, no ischemia on nuclear study 2011,,History of chronic diastolic heart failure, CVA treated with TPA in 2010, grade 4 aortic arch atherosclerosis on TEE in 2010.Previous carotid subclavian bypass by Dr. Kellie Simmering. Last carotid dopplers 12/2013 1-39% bilateral stenoses,her left common carotid artery-subclavian artery bypass graft patent. Greater than 50% left external carotid artery stenosis. She has significant venous insufficiency followed by Dr. Kellie Simmering.Status post laser ablation left small saphenous vein with multiple stab phlebectomy of painful varicosities over the past 8 months.  Last seen by Dr. Ron Parker 09/2014. Awakens from sleep with a sensation that blood isn't circulating well in her chest. No palpitations, chest pain or dyspnea with it.   Yesterday while cleaning carpets she developed chest tightness eased in 10 minWith rest. Has chronic dyspnea on exertion that has worsened recently. She has associated wheezing. Needs arthroscopic knee surgery and would have to be off Plavix 7 days. Supposed to go on a cruise to [place] in June but is asking for a note stating that she cannot make the trip because of her significant dyspnea on exertion.     Past Medical History  Diagnosis Date  . Hypertension   . CAD (coronary artery disease)     DES and circumflex 2006 /  DES to LAD 2011  . Hyperlipemia   . Hiatal hernia   . Esophageal reflux   . Lumbar disc disease   . Allergic rhinitis, cause unspecified   . Sleep related hypoventilation/hypoxemia in conditions classifiable elsewhere   . Shortness of breath   . Other diseases of lung, not elsewhere classified   . Chronic diastolic heart  failure ([place])   . Peripheral vascular disease, unspecified ([place])   . Unspecified venous (peripheral) insufficiency   . Obesity, unspecified   . Cyst of thyroid   . Diverticulosis of colon (without mention of hemorrhage)   . Personal history of colonic polyps     ADENOMATOUS POLYP  . Osteoarthrosis, unspecified whether generalized or localized, unspecified site   . Lumbago   . Nonspecific abnormal finding in stool contents   . Unspecified cerebral artery occlusion with cerebral infarction   . Anxiety state, unspecified   . Anemia, unspecified   . Stroke [facility]) 11/2008    Treated with TPA? /     2010, TEE no left atrial clot, probably from atherosclerosis of the aortic arch  . Ejection fraction      EF 60%, Echo, November 12, 2008, /  EF 55%, TEE, November 17, 2008  . Atherosclerosis of aorta ([place])     TEE, June, 2010, grade 4 aortic arch atherosclerosis , Dr. Aundra Dubin  . Dysphasia     Possibly esophageal stricture  . Carotid artery disease ([place])     Doppler, November, 2011, stable, 0-39% bilateral, turbulent flow left subclavian  . Palpitations     October, 2012  . Subclavian artery disease [facility])     Remote surgery by Dr. Victorino Dike.  Marland Kitchen PFO (patent foramen ovale)     Patient had a very small PFO by TEE 2010.  This was seen only by bubble analysis during Valsalva  . MVP (mitral valve prolapse)   .  Osteopenia   . Sleep apnea     Past Surgical History  Procedure Laterality Date  . Coronary angioplasty with stent placement  1990's  . Laparoscopic hysterectomy    . Urethral suspension  1993    Dr. Nori Riis  . Lumbar disc surgery      X 3  . Cholecystectomy    . Varicose vein surgery      x 2  . Cataracts      both eyes  . Back surgery      Current Medications: Outpatient Prescriptions Prior to Visit  Medication Sig Dispense Refill  . amLODipine (NORVASC) 5 MG tablet Take 5 mg by mouth daily.      Marland Kitchen atorvastatin (LIPITOR) 40 MG tablet Take 20 mg by mouth Daily.     Marland Kitchen BIOTIN PO Take 1  tablet by mouth daily.    . Cholecalciferol (VITAMIN D PO) Take 1 tablet by mouth daily.    . clopidogrel (PLAVIX) 75 MG tablet Take 1 tablet (75 mg total) by mouth daily. 90 tablet 3  . Cyanocobalamin (VITAMIN B-12 PO) Take 1 tablet by mouth daily.    . furosemide (LASIX) 40 MG tablet Take 40 mg by mouth daily as needed for edema.     . isosorbide mononitrate (IMDUR) 30 MG 24 hr tablet Take 30 mg by mouth daily.    . metoprolol (LOPRESSOR) 50 MG tablet Take 50 mg by mouth 2 (two) times daily.     . Multiple Vitamins-Minerals (EYE VITAMINS) CAPS Take 1 capsule by mouth daily.    . pantoprazole (PROTONIX) 40 MG tablet Take 40 mg by mouth at bedtime as needed (for indigestion).    . polyethylene glycol powder (GLYCOLAX/MIRALAX) powder Take 17 g by mouth daily.    . Cyanocobalamin (VITAMIN B-12 PO) Take by mouth daily. Reported on 11/10/2015    . gabapentin (NEURONTIN) 600 MG tablet Take 600 mg by mouth 3 (three) times daily. Reported on 11/10/2015    . nitroGLYCERIN (NITROSTAT) 0.4 MG SL tablet Place 1 tablet (0.4 mg total) under the tongue every 5 (five) minutes as needed. (Patient not taking: Reported on 11/10/2015) 25 tablet 3  . zolpidem (AMBIEN) 10 MG tablet Take 1 tablet (10 mg total) by mouth at bedtime as needed. (Patient not taking: Reported on 11/10/2015) 30 tablet 0   No facility-administered medications prior to visit.     Allergies:   Codeine   Social History   Social History  . Marital Status: Widowed    Spouse Name: N/A  . Number of Children: 4  . Years of Education: N/A   Occupational History  . Retired    Social History Main Topics  . Smoking status: Former Smoker -- 1.00 packs/day for 40 years    Types: Cigarettes    Quit date: 06/13/1998  . Smokeless tobacco: Never Used  . Alcohol Use: No  . Drug Use: No  . Sexual Activity: Not Asked   Other Topics Concern  . None   Social History Narrative     Family History:  The patient's    family history includes  Alzheimer's disease in her mother; Colon cancer in her paternal aunt; Heart attack in her father; Heart disease in her father. There is no history of Esophageal cancer, Rectal cancer, or Stomach cancer.   ROS:   Please see the history of present illness.    Review of Systems  Constitution: Negative.  HENT: Negative.   Eyes: Negative.   Cardiovascular: Positive for  chest pain and dyspnea on exertion.  Respiratory: Positive for wheezing.   Hematologic/Lymphatic: Bruises/bleeds easily.  Musculoskeletal: Positive for back pain and myalgias. Negative for joint pain.  Gastrointestinal: Negative.   Genitourinary: Negative.   Neurological: Negative.    All other systems reviewed and are negative.   PHYSICAL EXAM:   VS:  BP 140/70 mmHg  Pulse 63  Ht 5\' 8"  (1.727 m)  Wt 195 lb (88.451 kg)  BMI 29.66 kg/m2   GEN: Well nourished, well developed, in no acute distress Neck: no JVD, carotid bruits, or masses Cardiac:  RRR; no murmurs, rubs, or gallops,no edema  Respiratory:  clear to auscultation bilaterally, normal work of breathing GI: soft, nontender, nondistended, + BS MS: no deformity or atrophy Skin: warm and dry, no rash Neuro:  Alert and Oriented x 3, Strength and sensation are intact Psych: euthymic mood, full affect  Wt Readings from Last 3 Encounters:  11/10/15 195 lb (88.451 kg)  08/11/15 194 lb (87.998 kg)  05/11/15 190 lb (86.183 kg)      Studies/Labs Reviewed:   EKG:  EKG is  ordered today.  The ekg ordered today demonstrates Normal sinus rhythm, no acute change  Recent Labs: No results found for requested labs within last 365 days.   Lipid Panel    Component Value Date/Time   CHOL 201* 04/29/2010 0000   TRIG 128.0 04/29/2010 0000   TRIG 135 03/28/2006 0936   HDL 62.80 04/29/2010 0000   CHOLHDL 3 04/29/2010 0000   CHOLHDL 3.9 CALC 03/28/2006 0936   VLDL 25.6 04/29/2010 0000   LDLCALC * 11/12/2008 0520    102        Total Cholesterol/HDL:CHD Risk Coronary  Heart Disease Risk Table                     Men   Women  1/2 Average Risk   3.4   3.3  Average Risk       5.0   4.4  2 X Average Risk   9.6   7.1  3 X Average Risk  23.4   11.0        Use the calculated Patient Ratio above and the CHD Risk Table to determine the patient's CHD Risk.        ATP III CLASSIFICATION (LDL):  <100     mg/dL   Optimal  100-129  mg/dL   Near or Above                    Optimal  130-159  mg/dL   Borderline  160-189  mg/dL   High  >190     mg/dL   Very High   LDLDIRECT 123.3 04/29/2010 0000    Additional studies/ records that were reviewed today include:  DATE OF PROCEDURE:  03/17/2005  DATE OF DISCHARGE:                               CARDIAC CATHETERIZATION    CLINICAL HISTORY:  Mrs. Winning is 75 years old and has had previous carotid  subclavian bypass surgery by Dr. Kellie Simmering.  She developed symptoms of  shortness of breath and was seen in consultation by Dr. Coralie Keens who  arranged for her to be evaluated with coronary angiography which was  performed yesterday in the outpatient laboratory.  She had a very tight  lesion in the mid right coronary artery and was  brought back today for  intervention.  She also has hypertension and hyperlipidemia.    PROCEDURE:  The procedure was performed via the left femoral artery using  arterial sheath and 6-French preformed coronary catheters.  A front wall  arterial puncture was performed and Omnipaque contrast was used.  Patient  was given Angiomax bolus and infusion and was given an extra 300 mg load of  Plavix.  She was also pre medicated for a possible dye allergy with Pepcid  and prednisone.    We chose a JR4 6-French guiding catheter with side holes.  We crossed the  lesion in the mid right coronary artery with a Prowater wire with a little  bit of difficulty.  We then pre dilated with a 3 x 8 mm Maverick performing  two inflations up to 8 atmospheres for 30 seconds.  The patient was enrolled  in the  [facility] trial and was randomized to a ZOMAX study stent.  We chose a 3  x 23 mm stent and deployed this with one inflation up to 12 atmospheres for  30 seconds.  We chose a long length because in the RAO view there appeared  to be a split in the vessel extending distal to the tight stenosis.  We then  post dilated with a 3.25 x 20 mm Quantum Maverick performing two inflations  up to 15 atmospheres for 30 seconds.  Final diagnostic studies were then  performed through the guiding catheter.  The patient tolerated the procedure  well and left the laboratory in satisfactory condition.    RESULTS:  Initially the stenosis in the mid right coronary artery was  estimated at 90%.  Following stenting this improved to less than 10%.    CONCLUSIONS:  Successful PCI of the lesion in the mid right coronary artery  using a ZOMAX drug-eluting study stent with improvement in center of  narrowing from 90% to 0%.    DISPOSITION:  Patient returned to postanesthesia care unit for further  observation.              ______________________________  Eustace Quail, M.D. [facility]        BB/MEDQ  D:  03/17/2005  T:  03/17/2005  Job:  [id number]     ASSESSMENT:    1. Varicose veins of lower extremities with complications, left   2. Coronary artery disease involving native coronary artery of native heart without angina pectoris   3. Bilateral carotid artery disease ([place])   4. Atherosclerosis of aorta ([facility])   5. Chest pain, unspecified chest pain type   6. Preoperative clearance   7. HYPERCHOLESTEROLEMIA      PLAN:  In order of problems listed above: Varicose veins of lower extremities followed by Dr. Kellie Simmering  CAD with recent anginal symptoms and significant dyspnea on exertion. In light of needing knee surgery and coming off the Plavix for 7 days recommend Lexi scan Myoview to rule out ischemia. Have given note for refund on her [place] cruise because of current cardiac workup. To become established with Dr.  Burt Knack.  History of bilateral carotid disease and bypass. Recommend carotid Dopplers  History of atherosclerosis of the aorta and PFO recommend 2-D echo  Hyperlipidemia on Lipitor. Lead work checked in January by primary care and she was told it was stable.     Medication Adjustments/Labs and Tests Ordered: Current medicines are reviewed at length with the patient today.  Concerns regarding medicines are outlined above.  Medication changes, Labs and Tests ordered  today are listed in the Patient Instructions below. Patient Instructions  Medication Instructions:  Your physician recommends that you continue on your current medications as directed. Please refer to the Current Medication list given to you today.   Labwork: None ordered  Testing/Procedures: Your physician has requested that you have an echocardiogram. Echocardiography is a painless test that uses sound waves to create images of your heart. It provides your doctor with information about the size and shape of your heart and how well your heart's chambers and valves are working. This procedure takes approximately one hour. There are no restrictions for this procedure.  Your physician has requested that you have a carotid duplex. This test is an ultrasound of the carotid arteries in your neck. It looks at blood flow through these arteries that supply the brain with blood. Allow one hour for this exam. There are no restrictions or special instructions.  Your physician has requested that you have a lexiscan myoview. For further information please visit [email or web address]. Please follow instruction sheet, as given.    Follow-Up: Your physician recommends that you schedule a follow-up appointment in: 3 months with Dr.Cooper   Any Other Special Instructions Will Be Listed Below (If Applicable).     If you need a refill on your cardiac medications before your next appointment, please call your pharmacy.        Sumner Boast, PA-C  11/10/2015 11:23 AM    [facility] [place], [place], [place]  [address] Phone: [phone]; Fax: [phone]

## 2015-11-10 NOTE — Patient Instructions (Signed)
Medication Instructions:  Your physician recommends that you continue on your current medications as directed. Please refer to the Current Medication list given to you today.   Labwork: None ordered  Testing/Procedures: Your physician has requested that you have an echocardiogram. Echocardiography is a painless test that uses sound waves to create images of your heart. It provides your doctor with information about the size and shape of your heart and how well your heart's chambers and valves are working. This procedure takes approximately one hour. There are no restrictions for this procedure.  Your physician has requested that you have a carotid duplex. This test is an ultrasound of the carotid arteries in your neck. It looks at blood flow through these arteries that supply the brain with blood. Allow one hour for this exam. There are no restrictions or special instructions.  Your physician has requested that you have a lexiscan myoview. For further information please visit [email or web address]. Please follow instruction sheet, as given.    Follow-Up: Your physician recommends that you schedule a follow-up appointment in: 3 months with Dr.Cooper   Any Other Special Instructions Will Be Listed Below (If Applicable).     If you need a refill on your cardiac medications before your next appointment, please call your pharmacy.

## 2015-11-17 ENCOUNTER — Ambulatory Visit (HOSPITAL_COMMUNITY)
Admission: RE | Admit: 2015-11-17 | Discharge: 2015-11-17 | Disposition: A | Discharge status: Home / Self Care | Payer: PPO | Source: Ambulatory Visit | Attending: Physician Assistant | Admitting: Physician Assistant

## 2015-11-17 DIAGNOSIS — I2510 Atherosclerotic heart disease of native coronary artery without angina pectoris: Secondary | ICD-10-CM | POA: Diagnosis not present

## 2015-11-17 DIAGNOSIS — I779 Disorder of arteries and arterioles, unspecified: Secondary | ICD-10-CM | POA: Insufficient documentation

## 2015-11-17 DIAGNOSIS — I739 Peripheral vascular disease, unspecified: Secondary | ICD-10-CM

## 2015-11-17 DIAGNOSIS — I6523 Occlusion and stenosis of bilateral carotid arteries: Secondary | ICD-10-CM | POA: Insufficient documentation

## 2015-11-17 DIAGNOSIS — E785 Hyperlipidemia, unspecified: Secondary | ICD-10-CM | POA: Insufficient documentation

## 2015-11-17 DIAGNOSIS — I5032 Chronic diastolic (congestive) heart failure: Secondary | ICD-10-CM | POA: Insufficient documentation

## 2015-11-17 DIAGNOSIS — I110 Hypertensive heart disease with heart failure: Secondary | ICD-10-CM | POA: Diagnosis not present

## 2015-11-18 ENCOUNTER — Telehealth: Payer: Self-pay | Admitting: *Deleted

## 2015-11-18 DIAGNOSIS — I739 Peripheral vascular disease, unspecified: Principal | ICD-10-CM

## 2015-11-18 DIAGNOSIS — I779 Disorder of arteries and arterioles, unspecified: Secondary | ICD-10-CM

## 2015-11-18 NOTE — Telephone Encounter (Signed)
Pt informed of results.Pt verbalized understanding

## 2015-11-18 NOTE — Telephone Encounter (Signed)
-----   Message from Imogene Burn, PA-C sent at 11/18/2015  8:51 AM EDT ----- Let patient know carotids are stable and prior subclavian bypass is stable. Repeat dopplers in 1 yr.  Stable 1-39% bilateral ICA stenosis.  >50% bilateral ECA stenosis.  Normal right subclavian artery.  Patent left CCA to subclavian artery bypass graft.  Patent vertebral arteries with antegrade flow.  F/U 1 year.

## 2015-11-19 DIAGNOSIS — M238X2 Other internal derangements of left knee: Secondary | ICD-10-CM | POA: Diagnosis not present

## 2015-11-19 DIAGNOSIS — M25562 Pain in left knee: Secondary | ICD-10-CM | POA: Diagnosis not present

## 2015-11-26 ENCOUNTER — Telehealth (HOSPITAL_COMMUNITY): Payer: Self-pay | Admitting: *Deleted

## 2015-11-26 NOTE — Telephone Encounter (Signed)
Patient given detailed instructions per Myocardial Perfusion Study Information Sheet for the test on 12/01/15 at 1000. Patient notified to arrive 15 minutes early and that it is imperative to arrive on time for appointment to keep from having the test rescheduled.  If you need to cancel or reschedule your appointment, please call the office within 24 hours of your appointment. Failure to do so may result in a cancellation of your appointment, and a $50 no show fee. Patient verbalized understanding.Shadae Reino, [place]anae [place]

## 2015-12-01 ENCOUNTER — Ambulatory Visit (HOSPITAL_COMMUNITY): Payer: PPO | Attending: Cardiology

## 2015-12-01 ENCOUNTER — Ambulatory Visit (HOSPITAL_BASED_OUTPATIENT_CLINIC_OR_DEPARTMENT_OTHER): Payer: PPO

## 2015-12-01 ENCOUNTER — Other Ambulatory Visit: Payer: Self-pay

## 2015-12-01 DIAGNOSIS — I700 Atherosclerosis of aorta: Secondary | ICD-10-CM | POA: Insufficient documentation

## 2015-12-01 DIAGNOSIS — I340 Nonrheumatic mitral (valve) insufficiency: Secondary | ICD-10-CM | POA: Insufficient documentation

## 2015-12-01 DIAGNOSIS — R0609 Other forms of dyspnea: Secondary | ICD-10-CM | POA: Insufficient documentation

## 2015-12-01 DIAGNOSIS — I110 Hypertensive heart disease with heart failure: Secondary | ICD-10-CM | POA: Insufficient documentation

## 2015-12-01 DIAGNOSIS — I509 Heart failure, unspecified: Secondary | ICD-10-CM | POA: Diagnosis not present

## 2015-12-01 DIAGNOSIS — R0602 Shortness of breath: Secondary | ICD-10-CM | POA: Diagnosis not present

## 2015-12-01 DIAGNOSIS — R079 Chest pain, unspecified: Secondary | ICD-10-CM | POA: Insufficient documentation

## 2015-12-01 DIAGNOSIS — I779 Disorder of arteries and arterioles, unspecified: Secondary | ICD-10-CM | POA: Insufficient documentation

## 2015-12-01 LAB — MYOCARDIAL PERFUSION IMAGING
CHL CUP NUCLEAR SRS: 2
CHL CUP NUCLEAR SSS: 4
CHL CUP RESTING HR STRESS: 52 {beats}/min
LV dias vol: 117 mL (ref 46–106)
LV sys vol: 54 mL
Peak HR: 83 {beats}/min
RATE: 0.21
SDS: 2
TID: 1.05

## 2015-12-01 MED ORDER — REGADENOSON 0.4 MG/5ML IV SOLN
0.4000 mg | Freq: Once | INTRAVENOUS | Status: AC
  Administered 2015-12-01: 0.4 mg via INTRAVENOUS

## 2015-12-01 MED ORDER — TECHNETIUM TC 99M TETROFOSMIN IV KIT
32.4000 | PACK | Freq: Once | INTRAVENOUS | Status: AC | PRN
  Administered 2015-12-01: 32 via INTRAVENOUS
  Filled 2015-12-01: qty 32

## 2015-12-01 MED ORDER — TECHNETIUM TC 99M TETROFOSMIN IV KIT
10.0000 | PACK | Freq: Once | INTRAVENOUS | Status: AC | PRN
  Administered 2015-12-01: 10 via INTRAVENOUS
  Filled 2015-12-01: qty 10

## 2015-12-07 ENCOUNTER — Telehealth: Payer: Self-pay | Admitting: Cardiovascular Disease

## 2015-12-07 NOTE — Telephone Encounter (Signed)
The pt has been given her Myoview and Echo results. She verbalized understanding and thanked me for calling her back.

## 2015-12-07 NOTE — Telephone Encounter (Signed)
Follow Up:  Pt said somebody called last Thursday or Friday and gave her test results. She said she does not remember what they said,she had just woke up.

## 2015-12-19 DIAGNOSIS — L299 Pruritus, unspecified: Secondary | ICD-10-CM | POA: Diagnosis not present

## 2015-12-19 DIAGNOSIS — R0781 Pleurodynia: Secondary | ICD-10-CM | POA: Diagnosis not present

## 2015-12-19 DIAGNOSIS — S299XXA Unspecified injury of thorax, initial encounter: Secondary | ICD-10-CM | POA: Diagnosis not present

## 2016-02-12 ENCOUNTER — Encounter: Payer: Self-pay | Admitting: Cardiovascular Disease

## 2016-02-25 DIAGNOSIS — E039 Hypothyroidism, unspecified: Secondary | ICD-10-CM | POA: Diagnosis not present

## 2016-02-25 DIAGNOSIS — E782 Mixed hyperlipidemia: Secondary | ICD-10-CM | POA: Diagnosis not present

## 2016-02-29 ENCOUNTER — Ambulatory Visit (INDEPENDENT_AMBULATORY_CARE_PROVIDER_SITE_OTHER): Payer: PPO | Admitting: Cardiovascular Disease

## 2016-02-29 ENCOUNTER — Encounter: Payer: Self-pay | Admitting: Cardiovascular Disease

## 2016-02-29 VITALS — BP 120/64 | HR 68 | Ht 68.0 in | Wt 199.8 lb

## 2016-02-29 DIAGNOSIS — I2510 Atherosclerotic heart disease of native coronary artery without angina pectoris: Secondary | ICD-10-CM | POA: Diagnosis not present

## 2016-02-29 NOTE — Patient Instructions (Signed)
Medication Instructions:  Your physician recommends that you continue on your current medications as directed. Please refer to the Current Medication list given to you today.  Labwork: No new orders.   Testing/Procedures: No new orders.   Follow-Up: Your physician recommends that you schedule a follow-up appointment in: 6 MONTHS with PA/NP  Your physician wants you to follow-up in: 1 YEAR with Dr Burt Knack.  You will receive a reminder letter in the mail two months in advance. If you don't receive a letter, please call our office to schedule the follow-up appointment.   Any Other Special Instructions Will Be Listed Below (If Applicable).  Primary Care providers at [facility] location: Dr Quay Burow or Dr Sharlet Salina, [phone]     If you need a refill on your cardiac medications before your next appointment, please call your pharmacy.

## 2016-02-29 NOTE — Progress Notes (Signed)
Cardiology Office Note Date:  02/29/2016   ID:  Stephanie Alvarado, DOB 1940/10/26, MRN [id number]  PCP:  Gar Ponto, MD  Cardiologist:  Sherren Mocha, MD    Chief Complaint  Patient presents with  . Coronary Artery Disease     History of Present Illness: Stephanie Alvarado is a 75 y.o. female who presents for Follow-up of coronary artery disease. She's been followed regularly by Dr. Ron Parker in the past. She initially underwent PCI the right coronary artery in 2006 with a Zomax study stent and then was noted to have progressive stenosis of the proximal LAD in 2009 and she was treated with a Promus DES by Dr. Olevia Perches. Last seen by Estella Husk, PA-C in May 2017 for preoperative assessment. A Lexiscan Myoview stress test showed no ischemia in June 2017.  She continues to complain of dyspnea with exertion. This is been her primary limitation from a symptomatic perspective. She's had no recent chest pain or pressure. No edema, orthopnea, or PND. No heart palpitations.    Past Medical History:  Diagnosis Date  . Allergic rhinitis, cause unspecified   . Anemia, unspecified   . Anxiety state, unspecified   . Atherosclerosis of aorta ([place])    TEE, June, 2010, grade 4 aortic arch atherosclerosis , Dr. Aundra Dubin  . CAD (coronary artery disease)    DES and circumflex 2006 /  DES to LAD 2011  . Carotid artery disease ([place])    Doppler, November, 2011, stable, 0-39% bilateral, turbulent flow left subclavian  . Chronic diastolic heart failure ([place])   . Cyst of thyroid   . Diverticulosis of colon (without mention of hemorrhage)   . Dysphasia    Possibly esophageal stricture  . Ejection fraction     EF 60%, Echo, November 12, 2008, /  EF 55%, TEE, November 17, 2008  . Esophageal reflux   . Hiatal hernia   . Hyperlipemia   . Hypertension   . Lumbago   . Lumbar disc disease   . MVP (mitral valve prolapse)   . Nonspecific abnormal finding in stool contents   . Obesity, unspecified   . Osteoarthrosis,  unspecified whether generalized or localized, unspecified site   . Osteopenia   . Other diseases of lung, not elsewhere classified   . Palpitations    October, 2012  . Peripheral vascular disease, unspecified ([place])   . Personal history of colonic polyps    ADENOMATOUS POLYP  . PFO (patent foramen ovale)    Patient had a very small PFO by TEE 2010.  This was seen only by bubble analysis during Valsalva  . Shortness of breath   . Sleep apnea   . Sleep related hypoventilation/hypoxemia in conditions classifiable elsewhere   . Stroke [facility]) 11/2008   Treated with TPA? /     2010, TEE no left atrial clot, probably from atherosclerosis of the aortic arch  . 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 Victorino Dike.  Marland Kitchen Unspecified cerebral artery occlusion with cerebral infarction   . Unspecified venous (peripheral) insufficiency     Past Surgical History:  Procedure Laterality Date  . BACK SURGERY    . cataracts     both eyes  . CHOLECYSTECTOMY    . CORONARY ANGIOPLASTY WITH STENT PLACEMENT  1990's  . LAPAROSCOPIC HYSTERECTOMY    . LUMBAR DISC SURGERY     X 3  . urethral suspension  1993   Dr. Nori Riis  . VARICOSE VEIN SURGERY  x 2    Current Outpatient Prescriptions  Medication Sig Dispense Refill  . amLODipine (NORVASC) 5 MG tablet Take 5 mg by mouth daily.      Marland Kitchen BIOTIN PO Take 1 tablet by mouth daily.    Marland Kitchen buPROPion (WELLBUTRIN XL) 150 MG 24 hr tablet Take 150 mg by mouth 2 (two) times daily.     . Cholecalciferol (VITAMIN D PO) Take 1 tablet by mouth daily.    . clopidogrel (PLAVIX) 75 MG tablet Take 1 tablet (75 mg total) by mouth daily. 90 tablet 3  . Cyanocobalamin (VITAMIN B-12 PO) Take 1 tablet by mouth daily.    . furosemide (LASIX) 40 MG tablet Take 40 mg by mouth daily as needed for edema.     . gabapentin (NEURONTIN) 300 MG capsule Take 300 mg by mouth 3 (three) times daily.    . isosorbide mononitrate (IMDUR) 30 MG 24 hr tablet Take 30 mg by mouth daily.     . metoprolol (LOPRESSOR) 50 MG tablet Take 50 mg by mouth 2 (two) times daily.     . Multiple Vitamins-Minerals (EYE VITAMINS) CAPS Take 1 capsule by mouth daily.    . nitroGLYCERIN (NITROSTAT) 0.4 MG SL tablet Place 0.4 mg under the tongue every 5 (five) minutes as needed for chest pain (TAKE UP TO 3 DOSES BEFORE CALLING 911).    . pantoprazole (PROTONIX) 40 MG tablet Take 40 mg by mouth 2 (two) times daily as needed (for indigestion).     . polyethylene glycol powder (GLYCOLAX/MIRALAX) powder Take 17 g by mouth daily.     No current facility-administered medications for this visit.     Allergies:   Codeine and Lipitor [atorvastatin]   Social History:  The patient  reports that she quit smoking about 17 years ago. Her smoking use included Cigarettes. She has a 40.00 pack-year smoking history. She has never used smokeless tobacco. She reports that she does not drink alcohol or use drugs.   Family History:  The patient's  family history includes Alzheimer's disease in her mother; Colon cancer in her paternal aunt; Heart attack in her father; Heart disease in her father.    ROS:  Please see the history of present illness.   All other systems are reviewed and negative.    PHYSICAL EXAM: VS:  BP 120/64   Pulse 68   Ht 5\' 8"  (1.727 m)   Wt 90.6 kg (199 lb 12.8 oz)   BMI 30.38 kg/m  , BMI Body mass index is 30.38 kg/m. GEN: Well nourished, well developed, in no acute distress  HEENT: normal  Neck: no JVD, no masses. bilateral carotid bruits Cardiac: RRR without murmur or gallop                Respiratory:  clear to auscultation bilaterally, normal work of breathing GI: soft, nontender, nondistended, + BS MS: no deformity or atrophy  Ext: no pretibial edema, pedal pulses 2+= bilaterally Skin: warm and dry, no rash Neuro:  Strength and sensation are intact Psych: euthymic mood, full affect  EKG:  EKG is not ordered today.  Recent Labs: No results found for requested labs within  last 8760 hours.   Lipid Panel     Component Value Date/Time   CHOL 201 (H) 04/29/2010 0000   TRIG 128.0 04/29/2010 0000   TRIG 135 03/28/2006 0936   HDL 62.80 04/29/2010 0000   CHOLHDL 3 04/29/2010 0000   VLDL 25.6 04/29/2010 0000   LDLCALC (H) 11/12/2008  0520    102        Total Cholesterol/HDL:CHD Risk Coronary Heart Disease Risk Table                     Men   Women  1/2 Average Risk   3.4   3.3  Average Risk       5.0   4.4  2 X Average Risk   9.6   7.1  3 X Average Risk  23.4   11.0        Use the calculated Patient Ratio above and the CHD Risk Table to determine the patient's CHD Risk.        ATP III CLASSIFICATION (LDL):  <100     mg/dL   Optimal  100-129  mg/dL   Near or Above                    Optimal  130-159  mg/dL   Borderline  160-189  mg/dL   High  >190     mg/dL   Very High   LDLDIRECT 123.3 04/29/2010 0000      Wt Readings from Last 3 Encounters:  02/29/16 90.6 kg (199 lb 12.8 oz)  12/01/15 88.5 kg (195 lb)  11/10/15 88.5 kg (195 lb)     Cardiac Studies Reviewed: Nuclear Stress Test: Study Highlights    Nuclear stress EF: 54%.  The left ventricular ejection fraction is mildly decreased (45-54%).  There was no ST segment deviation noted during stress.  No T wave inversion was noted during stress.  Defect 1: There is a small defect of moderate severity present in the apex location. Prior infarct vs. apical thinning. No ischemia.  The study is normal.   Echo 12/01/2015: Study Conclusions  - Left ventricle: The cavity size was normal. Wall thickness was   increased in a pattern of mild LVH. Systolic function was normal.   The estimated ejection fraction was in the range of 55% to 60%.   Basal inferior hypokinesis. Doppler parameters are consistent   with abnormal left ventricular relaxation (grade 1 diastolic   dysfunction). - Aortic valve: There was no stenosis. - Mitral valve: Mildly calcified annulus. There was trivial    regurgitation. - Left atrium: The atrium was mildly dilated. - Right ventricle: The cavity size was normal. Systolic function   was normal. - Right atrium: The atrium was mildly dilated. - Atrial septum: Possible PFO but not fully visualized. - Tricuspid valve: Peak RV-RA gradient (S): 27 mm Hg. - Pulmonary arteries: PA peak pressure: 30 mm Hg (S). - Inferior vena cava: The vessel was normal in size. The   respirophasic diameter changes were in the normal range (= 50%),   consistent with normal central venous pressure.  Impressions:  - Normal LV size with mild LV hypertrophy. EF 55-60%. Basal   inferior hypokinesis. Normal RV size and systolic function. No   significant valvular abnormalities. Possible PFO present but not   fully visualized.  Carotid Duplex 11-17-2015: Impressions Duplex imaging, with color Doppler, of the carotid arteries reveals heterogeneous plaque with shadowing in both bifurcations and CCA's. ICA velocities are normal and stable, bilaterally. Bilateral ECA velocities are elevated. The right subclavian artery is widely patent, with normal velocity flow. The left CCA to subclavian artery bypass is patent with elevated velocities. The vertebral arteries are patent with antegrade flow, bilaterally. Duplex imaging of the brachial artery demonstrates biphasic waveforms, bilaterally. Technologist Notes Plaque Plaque Examination Data  cm/s cm/s Heterogeneous plaque, bilaterally. Stable 1-39% bilateral ICA stenosis. >50% bilateral ECA stenosis. Normal right subclavian artery. Patent left CCA to subclavian artery bypass graft. Patent vertebral arteries with antegrade flow. F/U 1 year.  ASSESSMENT AND PLAN: 1.  Coronary artery disease, native vessel, without symptoms of angina: The patient's nuclear scan is reviewed and showed no significant ischemia. Will continue her current medical program. If she needs to interrupt Plavix for surgery this would be acceptable. Her  medical program is reviewed and she is on multiple antianginal drugs as well as dual antiplatelet therapy and a statin drug. Medications are appropriate and will be continued without changes.  2. Bilateral carotid artery disease: Most recent vascular study reviewed. She is followed by vascular surgery.  3. Hypercholesterolemia: Treated with atorvastatin. Lipids been followed by primary care.  4. Chronic dyspnea: Recent cardiac studies reviewed. Echo shows mild diastolic dysfunction. Will continue her current medications. No further cardiac workup indicated at this time.  Current medicines are reviewed with the patient today.  The patient does not have concerns regarding medicines.  Labs/ tests ordered today include:  No orders of the defined types were placed in this encounter.   Disposition:   FU 6 months with APP  Signed, Sherren Mocha, MD  02/29/2016 12:58 PM    [address], [place], [place]  [address] Phone: [phone]; Fax: [phone]

## 2016-03-02 DIAGNOSIS — I10 Essential (primary) hypertension: Secondary | ICD-10-CM | POA: Diagnosis not present

## 2016-03-02 DIAGNOSIS — F331 Major depressive disorder, recurrent, moderate: Secondary | ICD-10-CM | POA: Diagnosis not present

## 2016-03-02 DIAGNOSIS — E782 Mixed hyperlipidemia: Secondary | ICD-10-CM | POA: Diagnosis not present

## 2016-03-02 DIAGNOSIS — M7061 Trochanteric bursitis, right hip: Secondary | ICD-10-CM | POA: Diagnosis not present

## 2016-03-02 DIAGNOSIS — Z23 Encounter for immunization: Secondary | ICD-10-CM | POA: Diagnosis not present

## 2016-03-02 DIAGNOSIS — Z6830 Body mass index (BMI) 30.0-30.9, adult: Secondary | ICD-10-CM | POA: Diagnosis not present

## 2016-03-02 DIAGNOSIS — E039 Hypothyroidism, unspecified: Secondary | ICD-10-CM | POA: Diagnosis not present

## 2016-03-02 DIAGNOSIS — I25111 Atherosclerotic heart disease of native coronary artery with angina pectoris with documented spasm: Secondary | ICD-10-CM | POA: Diagnosis not present

## 2016-03-03 ENCOUNTER — Encounter: Payer: Self-pay | Admitting: Cardiovascular Disease

## 2016-04-13 DIAGNOSIS — H524 Presbyopia: Secondary | ICD-10-CM | POA: Diagnosis not present

## 2016-04-13 DIAGNOSIS — H52223 Regular astigmatism, bilateral: Secondary | ICD-10-CM | POA: Diagnosis not present

## 2016-04-13 DIAGNOSIS — H5213 Myopia, bilateral: Secondary | ICD-10-CM | POA: Diagnosis not present

## 2016-04-15 DIAGNOSIS — M5416 Radiculopathy, lumbar region: Secondary | ICD-10-CM | POA: Diagnosis not present

## 2016-04-19 DIAGNOSIS — M5416 Radiculopathy, lumbar region: Secondary | ICD-10-CM | POA: Diagnosis not present

## 2016-04-19 DIAGNOSIS — M1610 Unilateral primary osteoarthritis, unspecified hip: Secondary | ICD-10-CM | POA: Diagnosis not present

## 2016-04-19 DIAGNOSIS — M4120 Other idiopathic scoliosis, site unspecified: Secondary | ICD-10-CM | POA: Diagnosis not present

## 2016-04-19 DIAGNOSIS — M545 Low back pain: Secondary | ICD-10-CM | POA: Diagnosis not present

## 2016-04-28 DIAGNOSIS — M25551 Pain in right hip: Secondary | ICD-10-CM | POA: Diagnosis not present

## 2016-05-02 DIAGNOSIS — M170 Bilateral primary osteoarthritis of knee: Secondary | ICD-10-CM | POA: Diagnosis not present

## 2016-05-02 DIAGNOSIS — M5431 Sciatica, right side: Secondary | ICD-10-CM | POA: Diagnosis not present

## 2016-05-10 DIAGNOSIS — L821 Other seborrheic keratosis: Secondary | ICD-10-CM | POA: Diagnosis not present

## 2016-05-10 DIAGNOSIS — L570 Actinic keratosis: Secondary | ICD-10-CM | POA: Diagnosis not present

## 2016-05-30 ENCOUNTER — Emergency Department (HOSPITAL_COMMUNITY): Payer: PPO

## 2016-05-30 ENCOUNTER — Inpatient Hospital Stay (HOSPITAL_COMMUNITY): Payer: PPO

## 2016-05-30 ENCOUNTER — Encounter (HOSPITAL_COMMUNITY): Payer: Self-pay

## 2016-05-30 ENCOUNTER — Observation Stay (HOSPITAL_COMMUNITY)
Admission: EM | Admit: 2016-05-30 | Discharge: 2016-06-01 | Disposition: A | Discharge status: Home / Self Care | Payer: PPO | Attending: Family Medicine | Admitting: Family Medicine

## 2016-05-30 DIAGNOSIS — I110 Hypertensive heart disease with heart failure: Secondary | ICD-10-CM | POA: Insufficient documentation

## 2016-05-30 DIAGNOSIS — I2510 Atherosclerotic heart disease of native coronary artery without angina pectoris: Secondary | ICD-10-CM | POA: Insufficient documentation

## 2016-05-30 DIAGNOSIS — Z86010 Personal history of colonic polyps: Secondary | ICD-10-CM | POA: Insufficient documentation

## 2016-05-30 DIAGNOSIS — I69354 Hemiplegia and hemiparesis following cerebral infarction affecting left non-dominant side: Secondary | ICD-10-CM | POA: Insufficient documentation

## 2016-05-30 DIAGNOSIS — K5730 Diverticulosis of large intestine without perforation or abscess without bleeding: Secondary | ICD-10-CM | POA: Insufficient documentation

## 2016-05-30 DIAGNOSIS — K449 Diaphragmatic hernia without obstruction or gangrene: Secondary | ICD-10-CM | POA: Insufficient documentation

## 2016-05-30 DIAGNOSIS — M1990 Unspecified osteoarthritis, unspecified site: Secondary | ICD-10-CM | POA: Diagnosis not present

## 2016-05-30 DIAGNOSIS — I341 Nonrheumatic mitral (valve) prolapse: Secondary | ICD-10-CM | POA: Insufficient documentation

## 2016-05-30 DIAGNOSIS — E669 Obesity, unspecified: Secondary | ICD-10-CM | POA: Insufficient documentation

## 2016-05-30 DIAGNOSIS — R0602 Shortness of breath: Secondary | ICD-10-CM | POA: Insufficient documentation

## 2016-05-30 DIAGNOSIS — Z800 Family history of malignant neoplasm of digestive organs: Secondary | ICD-10-CM | POA: Insufficient documentation

## 2016-05-30 DIAGNOSIS — I959 Hypotension, unspecified: Secondary | ICD-10-CM | POA: Diagnosis not present

## 2016-05-30 DIAGNOSIS — R001 Bradycardia, unspecified: Secondary | ICD-10-CM | POA: Insufficient documentation

## 2016-05-30 DIAGNOSIS — Z8249 Family history of ischemic heart disease and other diseases of the circulatory system: Secondary | ICD-10-CM | POA: Insufficient documentation

## 2016-05-30 DIAGNOSIS — E7800 Pure hypercholesterolemia, unspecified: Secondary | ICD-10-CM | POA: Insufficient documentation

## 2016-05-30 DIAGNOSIS — E785 Hyperlipidemia, unspecified: Secondary | ICD-10-CM | POA: Diagnosis not present

## 2016-05-30 DIAGNOSIS — Z955 Presence of coronary angioplasty implant and graft: Secondary | ICD-10-CM | POA: Insufficient documentation

## 2016-05-30 DIAGNOSIS — J309 Allergic rhinitis, unspecified: Secondary | ICD-10-CM | POA: Insufficient documentation

## 2016-05-30 DIAGNOSIS — K5900 Constipation, unspecified: Secondary | ICD-10-CM | POA: Insufficient documentation

## 2016-05-30 DIAGNOSIS — R42 Dizziness and giddiness: Secondary | ICD-10-CM

## 2016-05-30 DIAGNOSIS — R911 Solitary pulmonary nodule: Secondary | ICD-10-CM | POA: Insufficient documentation

## 2016-05-30 DIAGNOSIS — K219 Gastro-esophageal reflux disease without esophagitis: Secondary | ICD-10-CM | POA: Diagnosis not present

## 2016-05-30 DIAGNOSIS — R270 Ataxia, unspecified: Secondary | ICD-10-CM

## 2016-05-30 DIAGNOSIS — I700 Atherosclerosis of aorta: Secondary | ICD-10-CM | POA: Diagnosis not present

## 2016-05-30 DIAGNOSIS — Z6830 Body mass index (BMI) 30.0-30.9, adult: Secondary | ICD-10-CM | POA: Diagnosis not present

## 2016-05-30 DIAGNOSIS — E041 Nontoxic single thyroid nodule: Secondary | ICD-10-CM | POA: Insufficient documentation

## 2016-05-30 DIAGNOSIS — M797 Fibromyalgia: Secondary | ICD-10-CM | POA: Diagnosis not present

## 2016-05-30 DIAGNOSIS — D649 Anemia, unspecified: Secondary | ICD-10-CM | POA: Diagnosis not present

## 2016-05-30 DIAGNOSIS — R0600 Dyspnea, unspecified: Secondary | ICD-10-CM

## 2016-05-30 DIAGNOSIS — R55 Syncope and collapse: Secondary | ICD-10-CM | POA: Diagnosis not present

## 2016-05-30 DIAGNOSIS — G8929 Other chronic pain: Secondary | ICD-10-CM | POA: Diagnosis not present

## 2016-05-30 DIAGNOSIS — Z803 Family history of malignant neoplasm of breast: Secondary | ICD-10-CM | POA: Insufficient documentation

## 2016-05-30 DIAGNOSIS — R2681 Unsteadiness on feet: Principal | ICD-10-CM | POA: Insufficient documentation

## 2016-05-30 DIAGNOSIS — Z7902 Long term (current) use of antithrombotics/antiplatelets: Secondary | ICD-10-CM | POA: Insufficient documentation

## 2016-05-30 DIAGNOSIS — I5032 Chronic diastolic (congestive) heart failure: Secondary | ICD-10-CM | POA: Insufficient documentation

## 2016-05-30 DIAGNOSIS — G4730 Sleep apnea, unspecified: Secondary | ICD-10-CM | POA: Insufficient documentation

## 2016-05-30 DIAGNOSIS — Z8673 Personal history of transient ischemic attack (TIA), and cerebral infarction without residual deficits: Secondary | ICD-10-CM

## 2016-05-30 DIAGNOSIS — I872 Venous insufficiency (chronic) (peripheral): Secondary | ICD-10-CM | POA: Insufficient documentation

## 2016-05-30 DIAGNOSIS — Q211 Atrial septal defect: Secondary | ICD-10-CM | POA: Insufficient documentation

## 2016-05-30 DIAGNOSIS — M545 Low back pain: Secondary | ICD-10-CM | POA: Insufficient documentation

## 2016-05-30 DIAGNOSIS — M8580 Other specified disorders of bone density and structure, unspecified site: Secondary | ICD-10-CM | POA: Insufficient documentation

## 2016-05-30 DIAGNOSIS — F419 Anxiety disorder, unspecified: Secondary | ICD-10-CM | POA: Diagnosis not present

## 2016-05-30 DIAGNOSIS — I739 Peripheral vascular disease, unspecified: Secondary | ICD-10-CM | POA: Insufficient documentation

## 2016-05-30 DIAGNOSIS — Z87891 Personal history of nicotine dependence: Secondary | ICD-10-CM | POA: Insufficient documentation

## 2016-05-30 LAB — URINALYSIS, ROUTINE W REFLEX MICROSCOPIC
Bilirubin Urine: NEGATIVE
Glucose, UA: NEGATIVE mg/dL
Hgb urine dipstick: NEGATIVE
KETONES UR: 5 mg/dL — AB
LEUKOCYTES UA: NEGATIVE
NITRITE: NEGATIVE
PROTEIN: NEGATIVE mg/dL
Specific Gravity, Urine: 1.013 (ref 1.005–1.030)
pH: 7 (ref 5.0–8.0)

## 2016-05-30 LAB — DIFFERENTIAL
BASOS PCT: 1 %
Basophils Absolute: 0 10*3/uL (ref 0.0–0.1)
EOS ABS: 0.2 10*3/uL (ref 0.0–0.7)
EOS PCT: 3 %
Lymphocytes Relative: 28 %
Lymphs Abs: 1.6 10*3/uL (ref 0.7–4.0)
MONO ABS: 0.4 10*3/uL (ref 0.1–1.0)
Monocytes Relative: 8 %
NEUTROS ABS: 3.4 10*3/uL (ref 1.7–7.7)
Neutrophils Relative %: 60 %

## 2016-05-30 LAB — COMPREHENSIVE METABOLIC PANEL
ALT: 17 U/L (ref 14–54)
ANION GAP: 10 (ref 5–15)
AST: 25 U/L (ref 15–41)
Albumin: 4.1 g/dL (ref 3.5–5.0)
Alkaline Phosphatase: 78 U/L (ref 38–126)
BUN: 7 mg/dL (ref 6–20)
CO2: 24 mmol/L (ref 22–32)
Calcium: 9.9 mg/dL (ref 8.9–10.3)
Chloride: 107 mmol/L (ref 101–111)
Creatinine, Ser: 0.86 mg/dL (ref 0.44–1.00)
GFR calc non Af Amer: >60 mL/min (ref >60)
Glucose, Bld: 91 mg/dL (ref 65–99)
POTASSIUM: 3.6 mmol/L (ref 3.5–5.1)
SODIUM: 141 mmol/L (ref 135–145)
Total Bilirubin: 1 mg/dL (ref 0.3–1.2)
Total Protein: 7.1 g/dL (ref 6.5–8.1)

## 2016-05-30 LAB — CBC
HCT: 44.1 % (ref 36.0–46.0)
Hemoglobin: 14.5 g/dL (ref 12.0–15.0)
MCH: 30.6 pg (ref 26.0–34.0)
MCHC: 32.9 g/dL (ref 30.0–36.0)
MCV: 93 fL (ref 78.0–100.0)
PLATELETS: 232 10*3/uL (ref 150–400)
RBC: 4.74 MIL/uL (ref 3.87–5.11)
RDW: 16 % — AB (ref 11.5–15.5)
WBC: 5.7 10*3/uL (ref 4.0–10.5)

## 2016-05-30 LAB — PROTIME-INR
INR: 0.99
Prothrombin Time: 13.1 seconds (ref 11.4–15.2)

## 2016-05-30 LAB — APTT: APTT: 30 s (ref 24–36)

## 2016-05-30 LAB — I-STAT TROPONIN, ED: TROPONIN I, POC: 0 ng/mL (ref 0.00–0.08)

## 2016-05-30 MED ORDER — ACETAMINOPHEN 650 MG RE SUPP: 650.0000 mg | RECTAL | Status: DC | PRN

## 2016-05-30 MED ORDER — FUROSEMIDE 40 MG PO TABS: 40.0000 mg | ORAL_TABLET | Freq: Every day | ORAL | Status: DC | PRN

## 2016-05-30 MED ORDER — SENNOSIDES-DOCUSATE SODIUM 8.6-50 MG PO TABS: 1.0000 | ORAL_TABLET | Freq: Every evening | ORAL | Status: DC | PRN

## 2016-05-30 MED ORDER — PANTOPRAZOLE SODIUM 40 MG PO TBEC
40.0000 mg | DELAYED_RELEASE_TABLET | Freq: Every day | ORAL | Status: DC
Start: 2016-05-31 — End: 2016-06-01
  Administered 2016-05-31 – 2016-06-01 (×2): 40 mg via ORAL
  Filled 2016-05-30 (×2): qty 1

## 2016-05-30 MED ORDER — BUPROPION HCL ER (XL) 150 MG PO TB24
150.0000 mg | ORAL_TABLET | Freq: Two times a day (BID) | ORAL | Status: DC
  Administered 2016-05-31 – 2016-06-01 (×4): 150 mg via ORAL
  Filled 2016-05-30 (×4): qty 1

## 2016-05-30 MED ORDER — CLOPIDOGREL BISULFATE 75 MG PO TABS
75.0000 mg | ORAL_TABLET | Freq: Every day | ORAL | Status: DC
  Administered 2016-05-31 – 2016-06-01 (×2): 75 mg via ORAL
  Filled 2016-05-30 (×2): qty 1

## 2016-05-30 MED ORDER — ACETAMINOPHEN 160 MG/5ML PO SOLN: 650.0000 mg | ORAL | Status: DC | PRN

## 2016-05-30 MED ORDER — AMLODIPINE BESYLATE 5 MG PO TABS
5.0000 mg | ORAL_TABLET | Freq: Every day | ORAL | Status: DC
  Administered 2016-05-31 – 2016-06-01 (×2): 5 mg via ORAL
  Filled 2016-05-30 (×2): qty 1

## 2016-05-30 MED ORDER — SODIUM CHLORIDE 0.9 % IV SOLN
INTRAVENOUS | Status: DC
  Administered 2016-05-31: via INTRAVENOUS

## 2016-05-30 MED ORDER — ENOXAPARIN SODIUM 40 MG/0.4ML ~~LOC~~ SOLN
40.0000 mg | SUBCUTANEOUS | Status: DC
  Administered 2016-05-31 – 2016-06-01 (×2): 40 mg via SUBCUTANEOUS
  Filled 2016-05-30 (×2): qty 0.4

## 2016-05-30 MED ORDER — POLYETHYLENE GLYCOL 3350 17 G PO PACK: 17.0000 g | PACK | Freq: Every day | ORAL | Status: DC | PRN

## 2016-05-30 MED ORDER — NITROGLYCERIN 0.4 MG SL SUBL: 0.4000 mg | SUBLINGUAL_TABLET | SUBLINGUAL | Status: DC | PRN

## 2016-05-30 MED ORDER — ACETAMINOPHEN 325 MG PO TABS: 650.0000 mg | ORAL_TABLET | ORAL | Status: DC | PRN

## 2016-05-30 MED ORDER — SODIUM CHLORIDE 0.9 % IV SOLN
Freq: Once | INTRAVENOUS | Status: AC
  Administered 2016-05-30: 19:00:00 via INTRAVENOUS

## 2016-05-30 MED ORDER — METOPROLOL TARTRATE 50 MG PO TABS
50.0000 mg | ORAL_TABLET | Freq: Two times a day (BID) | ORAL | Status: DC
  Administered 2016-05-31: 50 mg via ORAL
  Filled 2016-05-30: qty 1

## 2016-05-30 MED ORDER — GABAPENTIN 300 MG PO CAPS
300.0000 mg | ORAL_CAPSULE | Freq: Three times a day (TID) | ORAL | Status: DC
  Administered 2016-05-31 – 2016-06-01 (×5): 300 mg via ORAL
  Filled 2016-05-30 (×5): qty 1

## 2016-05-30 MED ORDER — ISOSORBIDE MONONITRATE ER 30 MG PO TB24
30.0000 mg | ORAL_TABLET | Freq: Every day | ORAL | Status: DC
  Administered 2016-06-01: 30 mg via ORAL
  Filled 2016-05-30 (×2): qty 1

## 2016-05-30 NOTE — ED Triage Notes (Signed)
Pt expressing nausea, updated that could not administer PO meds at this time until pt has had her stroke swallow screen. Pt verbalized understanding.

## 2016-05-30 NOTE — ED Notes (Signed)
Patient transported to MRI 

## 2016-05-30 NOTE — ED Notes (Signed)
Called MRI and they will be coming to get the pt and transporting to 14M after testing is complete

## 2016-05-30 NOTE — ED Provider Notes (Signed)
[facility] DEPT Provider Note   CSN: [id number] Arrival date & time: 05/30/16  1522     History   Chief Complaint Chief Complaint  Patient presents with  . unsteady gait/weakness    HPI Stephanie Alvarado is a 75 y.o. female.  HPI  The patient is a 75 year old female, she has a history of anxiety, coronary disease, carotid artery disease, chronic diastolic heart failure as well as a history of a stroke that she reports to me. She presents to the hospital with an unsteady gait which started yesterday morning when she woke up. She reports that she recently moved to a new house but was having difficulty ambulating without falling to the right and having to hold onto things. She consistently falls to the right side. When she is resting either sitting or laying down she does not feel like she has symptoms but when she does walk she does feel lightheaded and feels like she is going to fall over. She has been eating and drinking a normal diet, making plenty of urine and has no dysuria or diarrhea or rectal bleeding. She has no abdominal pain chest pain but does have some shortness of breath which seems to be much worse than it usually is. The symptoms have worsened today and throughout the day she has had even more dramatic symptoms becoming very short of breath and lightheaded while trying to do the dishes at her house.  Past Medical History:  Diagnosis Date  . Allergic rhinitis, cause unspecified   . Anemia, unspecified   . Anxiety state, unspecified   . Atherosclerosis of aorta ([place])    TEE, June, 2010, grade 4 aortic arch atherosclerosis , Dr. Aundra Dubin  . CAD (coronary artery disease)    DES and circumflex 2006 /  DES to LAD 2011  . Carotid artery disease ([place])    Doppler, November, 2011, stable, 0-39% bilateral, turbulent flow left subclavian  . Chronic diastolic heart failure ([place])   . Cyst of thyroid   . Diverticulosis of colon (without mention of hemorrhage)   . Dysphasia    Possibly esophageal stricture  . Ejection fraction     EF 60%, Echo, November 12, 2008, /  EF 55%, TEE, November 17, 2008  . Esophageal reflux   . Hiatal hernia   . Hyperlipemia   . Hypertension   . Lumbago   . Lumbar disc disease   . MVP (mitral valve prolapse)   . Nonspecific abnormal finding in stool contents   . Obesity, unspecified   . Osteoarthrosis, unspecified whether generalized or localized, unspecified site   . Osteopenia   . Other diseases of lung, not elsewhere classified   . Palpitations    October, 2012  . Peripheral vascular disease, unspecified   . Personal history of colonic polyps    ADENOMATOUS POLYP  . PFO (patent foramen ovale)    Patient had a very small PFO by TEE 2010.  This was seen only by bubble analysis during Valsalva  . Shortness of breath   . Sleep apnea   . Sleep related hypoventilation/hypoxemia in conditions classifiable elsewhere   . Stroke [facility]) 11/2008   Treated with TPA? /     2010, TEE no left atrial clot, probably from atherosclerosis of the aortic arch  . 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 Victorino Dike.  Marland Kitchen Unspecified cerebral artery occlusion with cerebral infarction   . Unspecified venous (peripheral) insufficiency     Patient Active Problem  List   Diagnosis Date Noted  . Ataxia 05/30/2016  . Preoperative clearance 11/10/2015  . Varicose veins of left lower extremity with complications [id number]  . Varicose veins of leg with complications [id number]  . Lumbar scoliosis 11/21/2013  . Preop cardiovascular exam 10/29/2013  . LLQ abdominal pain 04/05/2013  . Diverticulitis 04/05/2013  . Palpitations   . 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 PFO (patent foramen ovale)   . CAD (coronary artery disease)   . Ejection fraction   . Atherosclerosis of aorta ([place])   . Carotid artery disease ([place])   . 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 CONSTIPATION 06/11/2010  . ABDOMINAL PAIN-LLQ 06/11/2010  . PERSONAL HX COLONIC  POLYPS 06/11/2010  . SLEEP RELATED HYPOVENTILATION/HYPOXEMIA CCE 07/17/2009  . Stroke ([place]) 11/11/2008  . COLONIC POLYPS 08/31/2008  . THYROID CYST 08/31/2008  . VENOUS INSUFFICIENCY 08/31/2008  . DIVERTICULOSIS OF COLON 08/31/2008  . LOW BACK PAIN, CHRONIC 08/31/2008  . 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 DYSPNEA 08/08/2008  . HYPERCHOLESTEROLEMIA 04/19/2007  . OBESITY 04/19/2007  . ANEMIA 04/19/2007  . ANXIETY 04/19/2007  . Essential hypertension 04/19/2007  . ALLERGIC RHINITIS 04/19/2007  . PULMONARY NODULE, RIGHT LOWER LOBE 04/19/2007  . GERD 04/19/2007  . DEGENERATIVE JOINT DISEASE 04/19/2007    Past Surgical History:  Procedure Laterality Date  . BACK SURGERY    . cataracts     both eyes  . CHOLECYSTECTOMY    . CORONARY ANGIOPLASTY WITH STENT PLACEMENT  1990's  . LAPAROSCOPIC HYSTERECTOMY    . LUMBAR DISC SURGERY     X 3  . urethral suspension  1993   Dr. Nori Riis  . VARICOSE VEIN SURGERY     x 2    OB History    No data available       Home Medications    Prior to Admission medications   Medication Sig Start Date End Date Taking? Authorizing Provider  amLODipine (NORVASC) 5 MG tablet Take 5 mg by mouth daily.     Yes Historical Provider, MD  buPROPion (WELLBUTRIN XL) 150 MG 24 hr tablet Take 150 mg by mouth 2 (two) times daily.  10/31/15  Yes Historical Provider, MD  clopidogrel (PLAVIX) 75 MG tablet Take 1 tablet (75 mg total) by mouth daily. 06/03/13  Yes Carlena Bjornstad, MD  diphenhydramine-acetaminophen (TYLENOL PM) 25-500 MG TABS tablet Take 2 tablets by mouth at bedtime.   Yes Historical Provider, MD  furosemide (LASIX) 40 MG tablet Take 40 mg by mouth daily as needed for edema.    Yes Historical Provider, MD  gabapentin (NEURONTIN) 300 MG capsule Take 300 mg by mouth 3 (three) times daily. 10/31/15  Yes Historical Provider, MD  isosorbide mononitrate (IMDUR) 30 MG 24 hr tablet Take 30 mg by mouth daily.   Yes Historical Provider, MD  metoprolol (LOPRESSOR) 50 MG  tablet Take 50 mg by mouth 2 (two) times daily.  12/15/10  Yes Noralee Space, MD  nitroGLYCERIN (NITROSTAT) 0.4 MG SL tablet Place 0.4 mg under the tongue every 5 (five) minutes as needed for chest pain (TAKE UP TO 3 DOSES BEFORE CALLING 911).   Yes Historical Provider, MD  pantoprazole (PROTONIX) 40 MG tablet Take 40 mg by mouth daily.    Yes Historical Provider, MD  polyethylene glycol powder (GLYCOLAX/MIRALAX) powder Take 17 g by mouth daily as needed for moderate constipation.    Yes Sable Feil, MD    Family History Family History  Problem Relation Age of Onset  .  Heart disease Father   . Heart attack Father   . Alzheimer's disease Mother   . Colon cancer Paternal Aunt   . Breast cancer      cousions  . Esophageal cancer Neg Hx   . Rectal cancer Neg Hx   . Stomach cancer Neg Hx     Social History Social History  Substance Use Topics  . Smoking status: Former Smoker    Packs/day: 1.00    Years: 40.00    Types: Cigarettes    Quit date: 06/13/1998  . Smokeless tobacco: Never Used  . Alcohol use No     Allergies   Codeine and Lipitor [atorvastatin]   Review of Systems Review of Systems  All other systems reviewed and are negative.    Physical Exam Updated Vital Signs BP 174/74   Pulse 64   Temp 97.9 F (36.6 C) (Oral)   Resp 18   Ht 5\' 8"  (1.727 m)   Wt 200 lb (90.7 kg)   SpO2 98%   BMI 30.41 kg/m   Physical Exam  Constitutional: She appears well-developed and well-nourished. No distress.  HENT:  Head: Normocephalic and atraumatic.  Mouth/Throat: Oropharynx is clear and moist. No oropharyngeal exudate.  Eyes: Conjunctivae and EOM are normal. Pupils are equal, round, and reactive to light. Right eye exhibits no discharge. Left eye exhibits no discharge. No scleral icterus.  Neck: Normal range of motion. Neck supple. No JVD present. No thyromegaly present.  Cardiovascular: Normal rate, regular rhythm, normal heart sounds and intact distal pulses.  Exam  reveals no gallop and no friction rub.   No murmur heard. Pulmonary/Chest: Effort normal and breath sounds normal. No respiratory distress. She has no wheezes. She has no rales.  Abdominal: Soft. Bowel sounds are normal. She exhibits no distension and no mass. There is no tenderness.  Musculoskeletal: Normal range of motion. She exhibits no edema or tenderness.  Lymphadenopathy:    She has no cervical adenopathy.  Neurological: She is alert. Coordination normal.  The patient is able to walk though with great difficulty and when she stands still she falls to the right side. She has very poor balance, has a positive Romberg, she has no difficulty with finger-nose-finger while she is resting in the supine position and has normal strength in all 4 extremities. She has cranial nerves III through XII which appear normal, memory is intact, speech is normal. Peripheral visual fields and visual acuity is also normal.  Skin: Skin is warm and dry. No rash noted. No erythema.  Psychiatric: She has a normal mood and affect. Her behavior is normal.  Nursing note and vitals reviewed.    ED Treatments / Results  Labs (all labs ordered are listed, but only abnormal results are displayed) Labs Reviewed  CBC - Abnormal; Notable for the following:       Result Value   RDW 16.0 (*)    All other components within normal limits  PROTIME-INR  APTT  DIFFERENTIAL  COMPREHENSIVE METABOLIC PANEL  URINALYSIS, ROUTINE W REFLEX MICROSCOPIC  I-STAT TROPOININ, ED    EKG  EKG Interpretation  Date/Time:  Monday May 30 2016 18:56:37 EST Ventricular Rate:  65 PR Interval:    QRS Duration: 110 QT Interval:  435 QTC Calculation: 453 R Axis:   28 Text Interpretation:  Sinus rhythm Incomplete left bundle branch block Since last tracing rate slower Confirmed by Patty Lopezgarcia  MD, Seon Gaertner ([id number]) on 05/30/2016 8:01:14 PM      The patient has significant  increased risk for stroke, she has multiple risk factors and  up Radiology Ct Head Wo Contrast  Result Date: 05/30/2016 CLINICAL DATA:  Fatigue and unsteady gait. Near syncope. Nausea. Onset today. EXAM: CT HEAD WITHOUT CONTRAST TECHNIQUE: Contiguous axial images were obtained from the base of the skull through the vertex without intravenous contrast. COMPARISON:  09/21/2009 FINDINGS: Brain: There is no intracranial hemorrhage, mass or evidence of acute infarction. There is mild generalized atrophy. There is moderate chronic microvascular ischemic change. There are remote lacunar infarctions in the right putamen, right caudate, left internal capsule anterior limb. There is no significant extra-axial fluid collection. Vascular: No hyperdense vessel or unexpected calcification. Skull: Normal. Negative for fracture or focal lesion. Sinuses/Orbits: No acute finding. Other: None. IMPRESSION: No acute intracranial findings. There is moderate generalized atrophy and chronic appearing white matter hypodensities which likely represent small vessel ischemic disease. Remote lacunar infarctions. Electronically Signed   By: Andreas Newport M.D.   On: 05/30/2016 18:10    Procedures Procedures (including critical care time)  Medications Ordered in ED Medications  0.9 %  sodium chloride infusion ( Intravenous New Bag/Given 05/30/16 1902)     Initial Impression / Assessment and Plan / ED Course  I have reviewed the triage vital signs and the nursing notes.  Pertinent labs & imaging results that were available during my care of the patient were reviewed by me and considered in my medical decision making (see chart for details).  Clinical Course    Multiple risk factors for stroke, her exam is consistent with a possible stroke though I would consider other things as well. Troponin is negative, INR is less than 1, CBC is unremarkable No bleeding or acute stroke on CT scan. Basic metabolic also unremarkable. Will get neurologic consult, anticipate admission.  Discussed  the case with Dr. Nicole Kindred who will see the patient for admission, discussed the case with the family practice resident who will admit the patient to the hospital. MRI ordered.   Final Clinical Impressions(s) / ED Diagnoses   Final diagnoses:  Ataxia    New Prescriptions New Prescriptions   No medications on file     Noemi Chapel, MD 05/30/16 2001

## 2016-05-30 NOTE — H&P (Signed)
[facility] Admission History and Physical Service Pager: [phone]  Patient name: Stephanie Alvarado Medical record number: [id number] Date of birth: [birth year] Age: 75 y.o. Gender: female  Primary Care Provider: Gar Ponto, MD Consultants: neurology Code Status: partial- DNI  Chief Complaint: unsteady gait  Assessment and Plan: Stephanie Alvarado is a 75 y.o. female presenting with unsteady gait. PMH is significant for previous stroke, HTN, HLD, PFO, CHF, anxiety, GERD, constipation.  Unsteady gait- concern for stroke initially, neurology assessed patient in ED. CT head negative for acute intracranial abnormality except for diffuse atrophy. MR negative for acute stroke. Differential includes BPPV as patient only has feelings of unsteadiness when walking vs orthostatic hypotension.  -place in observation, telemetry. Attending Dr. Mingo Amber -continuous cardiac monitoring -vitals per floor -orthostatic blood pressure -neurology following, appreciate recommendations -am CBC, BMP -lipid panel, A1C -continue plavix -AM EKG -trend troponin -PT/OT consult- vestibular rehab -Decrease metoprolol to 25mg  bid given low DBP and HR. -May need MRA to visualize posterior circulation  HLD- reports taking a statin at home but none on med list. -add home statin in morning -lipid panel pending  HTN- norvasc 5mg  and lopressor 50mg . BP elevated in ED.  -continue home medications. -Decrease lopressor as noted above.   HFpEF- last Echo June 2017 with EF 0000000, grade 1 diastolic dysfunction. Lasix 40mg  -continue lasix  Anxiety- takes wellbutrin at home -continue home medication  GERD- protonix at home -continue protonix  Constipation- uses miralax at home -continue miralax daily  FEN/GI: regular diet Prophylaxis: lovenox  Disposition: pending clinical evaluation  History of Present Illness:  Stephanie Alvarado is a 75 y.o. female presenting with unsteady gait since  awakening this morning.  She is in the process of moving which she describes as very stressful. She was unpacking in new house yesterday and felt extremely tired. She then had a headache prior to going to sleep and took 4 ibuprofen. She woke up this morning to go to bathroom and felt extremely unsteady, she was unable to walk without holding on to things and felt like she was falling to the right. She felt lightheaded. She feels better if she lays down but the symptoms were not going away so she came to the ED. She feels essentially the same since being here, will feel lightheaded and as if she'll fall if she gets up. Denies feeling like the room is spinning. Nothing like this has happened before. Her previous stroke in 2010 she had left sided weakness. Daughter and fiance are at bedside.  Review Of Systems: Per HPI with the following additions:  Review of Systems  Constitutional: Positive for malaise/fatigue. Negative for chills and fever.  HENT: Negative for hearing loss.   Eyes: Negative for blurred vision and double vision.  Respiratory: Positive for shortness of breath.   Cardiovascular: Positive for leg swelling. Negative for chest pain and palpitations.  Gastrointestinal: Positive for constipation and nausea. Negative for abdominal pain and vomiting.  Genitourinary: Negative for dysuria.  Musculoskeletal: Negative for falls.  Neurological: Positive for dizziness, weakness and headaches. Negative for sensory change and speech change.  Psychiatric/Behavioral: Negative for substance abuse.    Patient Active Problem List   Diagnosis Date Noted  . Ataxia 05/30/2016  . Dizziness 05/30/2016  . Preoperative clearance 11/10/2015  . Varicose veins of left lower extremity with complications [id number]  . Varicose veins of leg with complications [id number]  . Lumbar scoliosis 11/21/2013  . Preop cardiovascular exam 10/29/2013  . LLQ abdominal  pain 04/05/2013  . Diverticulitis 04/05/2013  .  Palpitations   . 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 PFO (patent foramen ovale)   . CAD (coronary artery disease)   . Ejection fraction   . Atherosclerosis of aorta ([place])   . Carotid artery disease ([place])   . 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 CONSTIPATION 06/11/2010  . ABDOMINAL PAIN-LLQ 06/11/2010  . PERSONAL HX COLONIC POLYPS 06/11/2010  . SLEEP RELATED HYPOVENTILATION/HYPOXEMIA CCE 07/17/2009  . Stroke ([facility]) 11/11/2008  . COLONIC POLYPS 08/31/2008  . THYROID CYST 08/31/2008  . VENOUS INSUFFICIENCY 08/31/2008  . DIVERTICULOSIS OF COLON 08/31/2008  . LOW BACK PAIN, CHRONIC 08/31/2008  . 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 DYSPNEA 08/08/2008  . HYPERCHOLESTEROLEMIA 04/19/2007  . OBESITY 04/19/2007  . ANEMIA 04/19/2007  . ANXIETY 04/19/2007  . Essential hypertension 04/19/2007  . ALLERGIC RHINITIS 04/19/2007  . PULMONARY NODULE, RIGHT LOWER LOBE 04/19/2007  . GERD 04/19/2007  . DEGENERATIVE JOINT DISEASE 04/19/2007    Past Medical History: Past Medical History:  Diagnosis Date  . Allergic rhinitis, cause unspecified   . Anemia, unspecified   . Anxiety state, unspecified   . Atherosclerosis of aorta ([place])    TEE, June, 2010, grade 4 aortic arch atherosclerosis , Dr. Aundra Dubin  . CAD (coronary artery disease)    DES and circumflex 2006 /  DES to LAD 2011  . Carotid artery disease ([place])    Doppler, November, 2011, stable, 0-39% bilateral, turbulent flow left subclavian  . Chronic diastolic heart failure ([place])   . Cyst of thyroid   . Diverticulosis of colon (without mention of hemorrhage)   . Dysphasia    Possibly esophageal stricture  . Ejection fraction     EF 60%, Echo, November 12, 2008, /  EF 55%, TEE, November 17, 2008  . Esophageal reflux   . Hiatal hernia   . Hyperlipemia   . Hypertension   . Lumbago   . Lumbar disc disease   . MVP (mitral valve prolapse)   . Nonspecific abnormal finding in stool contents   . Obesity, unspecified   . Osteoarthrosis, unspecified whether  generalized or localized, unspecified site   . Osteopenia   . Other diseases of lung, not elsewhere classified   . Palpitations    October, 2012  . Peripheral vascular disease, unspecified   . Personal history of colonic polyps    ADENOMATOUS POLYP  . PFO (patent foramen ovale)    Patient had a very small PFO by TEE 2010.  This was seen only by bubble analysis during Valsalva  . Shortness of breath   . Sleep apnea   . Sleep related hypoventilation/hypoxemia in conditions classifiable elsewhere   . Stroke [facility]) 11/2008   Treated with TPA? /     2010, TEE no left atrial clot, probably from atherosclerosis of the aortic arch  . Subclavian artery disease [facility])    Remote surgery by Dr. Victorino Dike.  Marland Kitchen Unspecified cerebral artery occlusion with cerebral infarction   . Unspecified venous (peripheral) insufficiency     Past Surgical History: Past Surgical History:  Procedure Laterality Date  . BACK SURGERY    . cataracts     both eyes  . CHOLECYSTECTOMY    . CORONARY ANGIOPLASTY WITH STENT PLACEMENT  1990's  . LAPAROSCOPIC HYSTERECTOMY    . LUMBAR DISC SURGERY     X 3  . urethral suspension  1993   Dr. Nori Riis  . VARICOSE VEIN SURGERY     x 2  Social History: Social History  Substance Use Topics  . Smoking status: Former Smoker    Packs/day: 1.00    Years: 40.00    Types: Cigarettes    Quit date: 06/13/1998  . Smokeless tobacco: Never Used  . Alcohol use No   Additional social history: living with fiance until move is complete Please also refer to relevant sections of EMR.  Family History: Family History  Problem Relation Age of Onset  . Heart disease Father   . Heart attack Father   . Alzheimer's disease Mother   . Colon cancer Paternal Aunt   . Breast cancer      cousions  . Esophageal cancer Neg Hx   . Rectal cancer Neg Hx   . Stomach cancer Neg Hx    Allergies and Medications: Allergies  Allergen Reactions  . Codeine Itching    REACTION: itch  . Lipitor  [Atorvastatin] Other (See Comments)    myalgias   No current facility-administered medications on file prior to encounter.    Current Outpatient Prescriptions on File Prior to Encounter  Medication Sig Dispense Refill  . amLODipine (NORVASC) 5 MG tablet Take 5 mg by mouth daily.      Marland Kitchen buPROPion (WELLBUTRIN XL) 150 MG 24 hr tablet Take 150 mg by mouth 2 (two) times daily.     . clopidogrel (PLAVIX) 75 MG tablet Take 1 tablet (75 mg total) by mouth daily. 90 tablet 3  . furosemide (LASIX) 40 MG tablet Take 40 mg by mouth daily as needed for edema.     . gabapentin (NEURONTIN) 300 MG capsule Take 300 mg by mouth 3 (three) times daily.    . isosorbide mononitrate (IMDUR) 30 MG 24 hr tablet Take 30 mg by mouth daily.    . metoprolol (LOPRESSOR) 50 MG tablet Take 50 mg by mouth 2 (two) times daily.     . nitroGLYCERIN (NITROSTAT) 0.4 MG SL tablet Place 0.4 mg under the tongue every 5 (five) minutes as needed for chest pain (TAKE UP TO 3 DOSES BEFORE CALLING 911).    . pantoprazole (PROTONIX) 40 MG tablet Take 40 mg by mouth daily.     . polyethylene glycol powder (GLYCOLAX/MIRALAX) powder Take 17 g by mouth daily as needed for moderate constipation.       Objective: BP (!) 144/72 (BP Location: Left Arm)   Pulse (!) 59   Temp 97.7 F (36.5 C) (Oral)   Resp 18   Ht 5\' 8"  (1.727 m)   Wt 198 lb 11.2 oz (90.1 kg)   SpO2 97%   BMI 30.21 kg/m  Exam: General: elderly lady sitting up in bed in NAD Eyes: EOMI, PERRLA,  ENTM: moist mucous membranes, good dentition Neck: supple, non-tender, no lymphadenopathy Cardiovascular: RRR no MRG Respiratory: CTA bilaterally. No increased work of breathing Gastrointestinal: soft, non-tender +BS MSK: extremities warm and well perfused, no edema or cyanosis Derm: no rashes or lesions noted Neuro: CN2-12 in tact. 5/5 strength. Sensation in tact throughout.  Psych: appropriate mood and affect  Labs and Imaging: CBC BMET   Recent Labs Lab  05/30/16 1545  WBC 5.7  HGB 14.5  HCT 44.1  PLT 232    Recent Labs Lab 05/30/16 1545  NA 141  K 3.6  CL 107  CO2 24  BUN 7  CREATININE 0.86  GLUCOSE 91  CALCIUM 9.9     Ct Head Wo Contrast  Result Date: 05/30/2016 CLINICAL DATA:  Fatigue and unsteady gait. Near syncope. Nausea.  Onset today. EXAM: CT HEAD WITHOUT CONTRAST TECHNIQUE: Contiguous axial images were obtained from the base of the skull through the vertex without intravenous contrast. COMPARISON:  09/21/2009 FINDINGS: Brain: There is no intracranial hemorrhage, mass or evidence of acute infarction. There is mild generalized atrophy. There is moderate chronic microvascular ischemic change. There are remote lacunar infarctions in the right putamen, right caudate, left internal capsule anterior limb. There is no significant extra-axial fluid collection. Vascular: No hyperdense vessel or unexpected calcification. Skull: Normal. Negative for fracture or focal lesion. Sinuses/Orbits: No acute finding. Other: None. IMPRESSION: No acute intracranial findings. There is moderate generalized atrophy and chronic appearing white matter hypodensities which likely represent small vessel ischemic disease. Remote lacunar infarctions. Electronically Signed   By: Andreas Newport M.D.   On: 05/30/2016 18:10   Mr Brain Wo Contrast  Result Date: 05/30/2016 CLINICAL DATA:  History of stroke.  New unstable gait and dizziness. EXAM: MRI HEAD WITHOUT CONTRAST TECHNIQUE: Multiplanar, multiecho pulse sequences of the brain and surrounding structures were obtained without intravenous contrast. COMPARISON:  Head CT 05/30/2016 Brain MRI 09/21/2009 FINDINGS: Brain: No focal diffusion restriction to indicate acute infarct. No intraparenchymal hemorrhage. There is diffuse confluent hyperintense T2-weighted signal within the brainstem and periventricular, subcortical and deep white matter, most often seen in the setting of chronic microvascular ischemia. No mass  lesion or midline shift. No hydrocephalus or extra-axial fluid collection. The midline structures are normal. No age advanced or lobar predominant atrophy. Vascular: Major intracranial arterial and venous sinus flow voids are preserved. No evidence of chronic microhemorrhage or amyloid angiopathy. Focal susceptibility along the ependyma of the right lateral ventricle is favored to be due to mineralization. Skull and upper cervical spine: The visualized skull base, calvarium, upper cervical spine and extracranial soft tissues are normal. Sinuses/Orbits: No fluid levels or advanced mucosal thickening. No mastoid effusion. Normal orbits. IMPRESSION: 1. No acute intracranial abnormality. 2. Severe chronic microvascular ischemia, unchanged from 09/21/2009. Electronically Signed   By: Ulyses Jarred M.D.   On: 05/30/2016 21:56    Steve Rattler, DO 05/31/2016, 12:28 AM PGY-1, [place] Intern pager: [phone], text pages welcome  UPPER LEVEL ADDENDUM  I have read the above note and made revisions highlighted in blue.  Algis Greenhouse. Jerline Pain, [place] Resident PGY-3 05/31/2016 1:08 AM

## 2016-05-30 NOTE — ED Triage Notes (Signed)
Patient complains of 1 day of ongoing fatigue and this am awoke at 0600 with unsteady gait. States that she is so unsteady she had near fall. Alert and oriented, had stroke in 2010. Reports nausea with same. No obvious neuro deficits at triage

## 2016-05-30 NOTE — Consult Note (Addendum)
Admission H&P    Chief Complaint: Unstable gait with falling to the right side.  HPI: Stephanie Alvarado is an 75 y.o. female with a history of previous stroke, hypertension, hyperlipidemia, PFO, coronary artery disease with stents, and heart failure, presenting with new onset unstable gait with falling to the right and dizziness. Symptoms were present this morning when she woke up. She was last known well at about 11 PM on 05/29/2016. She has not experienced diplopia nor change in speech. There were no feelings of disequilibrium when lying supine. No focal extremity weakness no numbness has been experienced. She takes Plavix 75 mg per day. She has a history of stroke in 2010. CT scan of the head showed no acute changes.  LSN: 11:00 PM on 05/29/2016 tPA Given: No: No residual deficits mRankin:  Past Medical History:  Diagnosis Date  . Allergic rhinitis, cause unspecified   . Anemia, unspecified   . Anxiety state, unspecified   . Atherosclerosis of aorta ([place])    TEE, June, 2010, grade 4 aortic arch atherosclerosis , Dr. Aundra Dubin  . CAD (coronary artery disease)    DES and circumflex 2006 /  DES to LAD 2011  . Carotid artery disease ([place])    Doppler, November, 2011, stable, 0-39% bilateral, turbulent flow left subclavian  . Chronic diastolic heart failure ([place])   . Cyst of thyroid   . Diverticulosis of colon (without mention of hemorrhage)   . Dysphasia    Possibly esophageal stricture  . Ejection fraction     EF 60%, Echo, November 12, 2008, /  EF 55%, TEE, November 17, 2008  . Esophageal reflux   . Hiatal hernia   . Hyperlipemia   . Hypertension   . Lumbago   . Lumbar disc disease   . MVP (mitral valve prolapse)   . Nonspecific abnormal finding in stool contents   . Obesity, unspecified   . Osteoarthrosis, unspecified whether generalized or localized, unspecified site   . Osteopenia   . Other diseases of lung, not elsewhere classified   . Palpitations    October, 2012  . Peripheral vascular  disease, unspecified   . Personal history of colonic polyps    ADENOMATOUS POLYP  . PFO (patent foramen ovale)    Patient had a very small PFO by TEE 2010.  This was seen only by bubble analysis during Valsalva  . Shortness of breath   . Sleep apnea   . Sleep related hypoventilation/hypoxemia in conditions classifiable elsewhere   . Stroke [facility]) 11/2008   Treated with TPA? /     2010, TEE no left atrial clot, probably from atherosclerosis of the aortic arch  . Subclavian artery disease [facility])    Remote surgery by Dr. Victorino Dike.  Marland Kitchen Unspecified cerebral artery occlusion with cerebral infarction   . Unspecified venous (peripheral) insufficiency     Past Surgical History:  Procedure Laterality Date  . BACK SURGERY    . cataracts     both eyes  . CHOLECYSTECTOMY    . CORONARY ANGIOPLASTY WITH STENT PLACEMENT  1990's  . LAPAROSCOPIC HYSTERECTOMY    . LUMBAR DISC SURGERY     X 3  . urethral suspension  1993   Dr. Nori Riis  . VARICOSE VEIN SURGERY     x 2    Family History  Problem Relation Age of Onset  . Heart disease Father   . Heart attack Father   . Alzheimer's disease Mother   . Colon cancer Paternal Aunt   .  Breast cancer      cousions  . Esophageal cancer Neg Hx   . Rectal cancer Neg Hx   . Stomach cancer Neg Hx    Social History:  reports that she quit smoking about 17 years ago. Her smoking use included Cigarettes. She has a 40.00 pack-year smoking history. She has never used smokeless tobacco. She reports that she does not drink alcohol or use drugs.  Allergies:  Allergies  Allergen Reactions  . Codeine Itching    REACTION: itch  . Lipitor [Atorvastatin] Other (See Comments)    myalgias    Medications: Preadmission medications were reviewed by me.  ROS: History obtained from the patient  General ROS: negative for - chills, fatigue, fever, night sweats, weight gain or weight loss Psychological ROS: negative for - behavioral disorder, hallucinations, memory  difficulties, mood swings or suicidal ideation Ophthalmic ROS: negative for - blurry vision, double vision, eye pain or loss of vision ENT ROS: negative for - epistaxis, nasal discharge, oral lesions, sore throat, tinnitus or vertigo Allergy and Immunology ROS: negative for - hives or itchy/watery eyes Hematological and Lymphatic ROS: negative for - bleeding problems, bruising or swollen lymph nodes Endocrine ROS: negative for - galactorrhea, hair pattern changes, polydipsia/polyuria or temperature intolerance Respiratory ROS: negative for - cough, hemoptysis, shortness of breath or wheezing Cardiovascular ROS: negative for - chest pain, dyspnea on exertion, edema or irregular heartbeat Gastrointestinal ROS: negative for - abdominal pain, diarrhea, hematemesis, nausea/vomiting or stool incontinence Genito-Urinary ROS: negative for - dysuria, hematuria, incontinence or urinary frequency/urgency Musculoskeletal ROS: negative for - joint swelling or muscular weakness Neurological ROS: as noted in HPI Dermatological ROS: negative for rash and skin lesion changes  Physical Examination: Blood pressure 157/63, pulse (!) 58, temperature 97.9 F (36.6 C), temperature source Oral, resp. rate 21, height _0  (1.727 m), weight 90.7 kg (200 lb), SpO2 100 %.  HEENT-  Normocephalic, no lesions, without obvious abnormality.  Normal external eye and conjunctiva.  Normal TM's bilaterally.  Normal auditory canals and external ears. Normal external nose, mucus membranes and septum.  Normal pharynx. Neck supple with no masses, nodes, nodules or enlargement. Cardiovascular - regular rate and rhythm, S1, S2 normal, no murmur, click, rub or gallop Lungs - chest clear, no wheezing, rales, normal symmetric air entry Abdomen - soft, non-tender; bowel sounds normal; no masses,  no organomegaly Extremities - no joint deformities, effusion, or inflammation and no edema  Neurologic Examination: Mental Status: Alert,  oriented, thought content appropriate.  Speech fluent without evidence of aphasia. Able to follow commands without difficulty. Cranial Nerves: II-Visual fields were normal. III/IV/VI-Pupils were equal and reacted normally to light. Extraocular movements were full and conjugate. No nystagmus.    V/VII-no facial numbness and no facial weakness. VIII-normal. X-normal speech and symmetrical palatal movement. XI: trapezius strength/neck flexion strength normal bilaterally XII-midline tongue extension with normal strength. Motor: 5/5 bilaterally with normal tone and bulk Sensory: Normal throughout. Deep Tendon Reflexes: 2+ and symmetric. Plantars: Mute bilaterally Cerebellar: Normal finger-to-nose and heel-to-shin testing. Carotid auscultation: Normal  Results for orders placed or performed during the hospital encounter of 05/30/16 (from the past 48 hour(s))  Protime-INR     Status: None   Collection Time: 05/30/16  3:45 PM  Result Value Ref Range   Prothrombin Time 13.1 11.4 - 15.2 seconds   INR 0.99   APTT     Status: None   Collection Time: 05/30/16  3:45 PM  Result Value Ref Range   aPTT  30 24 - 36 seconds  CBC     Status: Abnormal   Collection Time: 05/30/16  3:45 PM  Result Value Ref Range   WBC 5.7 4.0 - 10.5 K/uL   RBC 4.74 3.87 - 5.11 MIL/uL   Hemoglobin 14.5 12.0 - 15.0 g/dL   HCT 44.1 36.0 - 46.0 %   MCV 93.0 78.0 - 100.0 fL   MCH 30.6 26.0 - 34.0 pg   MCHC 32.9 30.0 - 36.0 g/dL   RDW 16.0 (H) 11.5 - 15.5 %   Platelets 232 150 - 400 K/uL  Differential     Status: None   Collection Time: 05/30/16  3:45 PM  Result Value Ref Range   Neutrophils Relative % 60 %   Neutro Abs 3.4 1.7 - 7.7 K/uL   Lymphocytes Relative 28 %   Lymphs Abs 1.6 0.7 - 4.0 K/uL   Monocytes Relative 8 %   Monocytes Absolute 0.4 0.1 - 1.0 K/uL   Eosinophils Relative 3 %   Eosinophils Absolute 0.2 0.0 - 0.7 K/uL   Basophils Relative 1 %   Basophils Absolute 0.0 0.0 - 0.1 K/uL  Comprehensive  metabolic panel     Status: None   Collection Time: 05/30/16  3:45 PM  Result Value Ref Range   Sodium 141 135 - 145 mmol/L   Potassium 3.6 3.5 - 5.1 mmol/L   Chloride 107 101 - 111 mmol/L   CO2 24 22 - 32 mmol/L   Glucose, Bld 91 65 - 99 mg/dL   BUN 7 6 - 20 mg/dL   Creatinine, Ser 0.86 0.44 - 1.00 mg/dL   Calcium 9.9 8.9 - 10.3 mg/dL   Total Protein 7.1 6.5 - 8.1 g/dL   Albumin 4.1 3.5 - 5.0 g/dL   AST 25 15 - 41 U/L   ALT 17 14 - 54 U/L   Alkaline Phosphatase 78 38 - 126 U/L   Total Bilirubin 1.0 0.3 - 1.2 mg/dL   GFR calc non Af Amer >60 >60 mL/min   GFR calc Af Amer >60 >60 mL/min    Comment: (NOTE) The eGFR has been calculated using the CKD EPI equation. This calculation has not been validated in all clinical situations. eGFR's persistently <60 mL/min signify possible Chronic Kidney Disease.    Anion gap 10 5 - 15  I-stat troponin, ED     Status: None   Collection Time: 05/30/16  4:04 PM  Result Value Ref Range   Troponin i, poc 0.00 0.00 - 0.08 ng/mL   Comment 3            Comment: Due to the release kinetics of cTnI, a negative result within the first hours of the onset of symptoms does not rule out myocardial infarction with certainty. If myocardial infarction is still suspected, repeat the test at appropriate intervals.    Ct Head Wo Contrast  Result Date: 05/30/2016 CLINICAL DATA:  Fatigue and unsteady gait. Near syncope. Nausea. Onset today. EXAM: CT HEAD WITHOUT CONTRAST TECHNIQUE: Contiguous axial images were obtained from the base of the skull through the vertex without intravenous contrast. COMPARISON:  09/21/2009 FINDINGS: Brain: There is no intracranial hemorrhage, mass or evidence of acute infarction. There is mild generalized atrophy. There is moderate chronic microvascular ischemic change. There are remote lacunar infarctions in the right putamen, right caudate, left internal capsule anterior limb. There is no significant extra-axial fluid collection.  Vascular: No hyperdense vessel or unexpected calcification. Skull: Normal. Negative for fracture or focal lesion.  Sinuses/Orbits: No acute finding. Other: None. IMPRESSION: No acute intracranial findings. There is moderate generalized atrophy and chronic appearing white matter hypodensities which likely represent small vessel ischemic disease. Remote lacunar infarctions. Electronically Signed   By: Andreas Newport M.D.   On: 05/30/2016 18:10    Assessment: 75 y.o. female with multiple risk factors for stroke as well as previous stroke, presenting with possible acute brainstem or cerebellum infarction. Benign positional vertigo is also a consideration, particularly if MRI shows no acute posterior circulation stroke.  Stroke Risk Factors - hypertension, hyperlipidemia and PFO  Plan: 1. MRI of the brain without contrast 2. Stroke workup with risk assessment if MRI shows an acute infarction, including MRA, carotid Doppler, echocardiogram, fasting lipid panel and hemoglobin A1c 3. Physical therapy consult for gait analysis and vestibular therapy 4. Continue Plavix 75 mg per day  5. Meclizine 25 mg every 6-8 hours prn symptoms of disequilibrium.  We will continue to follow this patient with you.  C.R. Nicole Kindred, MD [facility] [phone]  05/30/2016, 8:04 PM

## 2016-05-30 NOTE — Progress Notes (Signed)
Pt admitted from ED with stroke symptoms, alert and oriented, denies any pain, pt settled in bed with call light and family at bedside, tele monitor put and verified on pt, admission doc paged for orders, pt reassured, will however continue to monitor. [name], Stephanie Alvarado

## 2016-05-30 NOTE — ED Notes (Signed)
Pt in CT at this time, Ct to bring pt to room after scan

## 2016-05-31 ENCOUNTER — Observation Stay (HOSPITAL_COMMUNITY): Payer: PPO

## 2016-05-31 ENCOUNTER — Observation Stay (HOSPITAL_BASED_OUTPATIENT_CLINIC_OR_DEPARTMENT_OTHER): Payer: PPO

## 2016-05-31 DIAGNOSIS — K219 Gastro-esophageal reflux disease without esophagitis: Secondary | ICD-10-CM | POA: Diagnosis not present

## 2016-05-31 DIAGNOSIS — I959 Hypotension, unspecified: Secondary | ICD-10-CM | POA: Diagnosis not present

## 2016-05-31 DIAGNOSIS — R0602 Shortness of breath: Secondary | ICD-10-CM | POA: Diagnosis not present

## 2016-05-31 DIAGNOSIS — R42 Dizziness and giddiness: Secondary | ICD-10-CM | POA: Diagnosis not present

## 2016-05-31 DIAGNOSIS — I2510 Atherosclerotic heart disease of native coronary artery without angina pectoris: Secondary | ICD-10-CM | POA: Diagnosis not present

## 2016-05-31 DIAGNOSIS — R2681 Unsteadiness on feet: Secondary | ICD-10-CM | POA: Diagnosis not present

## 2016-05-31 DIAGNOSIS — M79609 Pain in unspecified limb: Secondary | ICD-10-CM

## 2016-05-31 DIAGNOSIS — E785 Hyperlipidemia, unspecified: Secondary | ICD-10-CM | POA: Diagnosis not present

## 2016-05-31 DIAGNOSIS — I110 Hypertensive heart disease with heart failure: Secondary | ICD-10-CM | POA: Diagnosis not present

## 2016-05-31 DIAGNOSIS — F419 Anxiety disorder, unspecified: Secondary | ICD-10-CM | POA: Diagnosis not present

## 2016-05-31 DIAGNOSIS — R0600 Dyspnea, unspecified: Secondary | ICD-10-CM

## 2016-05-31 DIAGNOSIS — I69354 Hemiplegia and hemiparesis following cerebral infarction affecting left non-dominant side: Secondary | ICD-10-CM | POA: Diagnosis not present

## 2016-05-31 DIAGNOSIS — I5032 Chronic diastolic (congestive) heart failure: Secondary | ICD-10-CM | POA: Diagnosis not present

## 2016-05-31 DIAGNOSIS — R270 Ataxia, unspecified: Secondary | ICD-10-CM | POA: Diagnosis not present

## 2016-05-31 DIAGNOSIS — K5900 Constipation, unspecified: Secondary | ICD-10-CM | POA: Diagnosis not present

## 2016-05-31 DIAGNOSIS — R001 Bradycardia, unspecified: Secondary | ICD-10-CM | POA: Diagnosis not present

## 2016-05-31 LAB — BASIC METABOLIC PANEL
Anion gap: 6 (ref 5–15)
BUN: 12 mg/dL (ref 6–20)
CALCIUM: 9 mg/dL (ref 8.9–10.3)
CO2: 25 mmol/L (ref 22–32)
CREATININE: 0.92 mg/dL (ref 0.44–1.00)
Chloride: 109 mmol/L (ref 101–111)
GFR calc Af Amer: >60 mL/min (ref >60)
GFR, EST NON AFRICAN AMERICAN: 59 mL/min — AB (ref >60)
GLUCOSE: 104 mg/dL — AB (ref 65–99)
Potassium: 3.3 mmol/L — ABNORMAL LOW (ref 3.5–5.1)
Sodium: 140 mmol/L (ref 135–145)

## 2016-05-31 LAB — TROPONIN I: Troponin I: <0.03 ng/mL (ref <0.03)

## 2016-05-31 LAB — HEMOGLOBIN A1C
Hgb A1c MFr Bld: 5.8 % — ABNORMAL HIGH (ref 4.8–5.6)
MEAN PLASMA GLUCOSE: 120 mg/dL

## 2016-05-31 LAB — LIPID PANEL
CHOL/HDL RATIO: 2.2 ratio
CHOLESTEROL: 160 mg/dL (ref 0–200)
HDL: 73 mg/dL (ref >40)
LDL Cholesterol: 58 mg/dL (ref 0–99)
TRIGLYCERIDES: 144 mg/dL (ref <150)
VLDL: 29 mg/dL (ref 0–40)

## 2016-05-31 LAB — CBC
HCT: 39.1 % (ref 36.0–46.0)
Hemoglobin: 12.6 g/dL (ref 12.0–15.0)
MCH: 30 pg (ref 26.0–34.0)
MCHC: 32.2 g/dL (ref 30.0–36.0)
MCV: 93.1 fL (ref 78.0–100.0)
PLATELETS: 215 10*3/uL (ref 150–400)
RBC: 4.2 MIL/uL (ref 3.87–5.11)
RDW: 16.1 % — AB (ref 11.5–15.5)
WBC: 5.6 10*3/uL (ref 4.0–10.5)

## 2016-05-31 MED ORDER — IOPAMIDOL (ISOVUE-370) INJECTION 76%
INTRAVENOUS | Status: AC
  Filled 2016-05-31: qty 100

## 2016-05-31 MED ORDER — IOPAMIDOL (ISOVUE-370) INJECTION 76%
INTRAVENOUS | Status: AC
  Administered 2016-05-31: 80 mL
  Filled 2016-05-31: qty 100

## 2016-05-31 MED ORDER — METOPROLOL TARTRATE 25 MG PO TABS
25.0000 mg | ORAL_TABLET | Freq: Two times a day (BID) | ORAL | Status: DC
  Administered 2016-05-31 – 2016-06-01 (×2): 25 mg via ORAL
  Filled 2016-05-31 (×3): qty 1

## 2016-05-31 NOTE — Progress Notes (Signed)
*  PRELIMINARY RESULTS* Vascular Ultrasound Lower extremity venous duplex has been completed.  Preliminary findings: No evidence of DVT or baker's cyst.  Landry Mellow, RDMS, RVT  05/31/2016, 10:54 AM

## 2016-05-31 NOTE — Progress Notes (Signed)
Physical Therapy Evaluation (brief note with full evaluation to follow)  Vestibular evaluation completed and inconclusive. ?Rt posterior BPPV, some elements consistent with this, however did not become symptomatic with treatment. Will reassess in pm as pt leaving for CT scan.   05/31/16 1000  Home Living  Family/patient expects to be discharged to: Private residence  Living Arrangements Spouse/significant other (fiance)  Available Help at Discharge Friend(s)  Type of [place] (fiance's house; she is in process of moving)  Technical brewer of Steps 3  Entrance Stairs-Rails [place] One level  [place] - single point (?can borrow RW)  PT Assessment  PT Recommendation/Assessment Patient needs continued PT services  PT Problem List Decreased activity tolerance;Decreased balance;Decreased mobility;Decreased knowledge of use of DME;Impaired sensation;Obesity  PT Plan  PT Frequency (ACUTE ONLY) Min 4X/week  PT Treatment/Interventions (ACUTE ONLY) DME instruction;Gait training;Stair training;Functional mobility training;Therapeutic activities;Therapeutic exercise;Balance training;Neuromuscular re-education;Patient/family education  PT Recommendation  Recommendations for Other Services OT consult  Follow Up Recommendations Outpatient PT;Supervision for mobility/OOB  PT equipment Rolling walker with 5" wheels    05/31/2016 Barry Brunner, PT Pager: [phone]

## 2016-05-31 NOTE — Evaluation (Signed)
Physical Therapy Evaluation Patient Details Name: GLENEVA LEGROS MRN: [id number] DOB: 04-Sep-1940 Today's Date: 05/31/2016   History of Present Illness  75 y.o. female presenting with unsteady gait. Denies vertigo MRI brain negative; LE dopplers negative; CT angio chest negative; orthostatics negative PMH is significant for previous stroke with resolved Lt sided weakness, HTN, HLD, PFO, CHF, anxiety    Clinical Impression  Pt admitted with above symptoms. Patient with significant Rt lean and drift to her right when walking. Describes initial symptoms as "imbalance" and lasts seconds. Only brief nystagmus noted with Rt Hallpike-Dix and completed Rt cannalith repositioning (pt had no further symptoms as progressed through treatment, however). Pt currently with functional limitations due to the deficits listed below (see PT Problem List).  Pt will benefit from skilled PT to increase their independence and safety with mobility to allow discharge to the venue listed below.    Patient leaving for testing. Will return in pm for followup vestibular/balance assessment.    Follow Up Recommendations Outpatient PT;Supervision for mobility/OOB    Equipment Recommendations  Rolling walker with 5" wheels    Recommendations for Other Services OT consult     Precautions / Restrictions Precautions Precautions: Fall Precaution Comments: Pt reports 5 falls this year; each involved tripping over obstacle or her own feet     05/31/16 1640  Vestibular Assessment  General Observation describes waking up 12/18 with difficulty walking to bathroom due to Rt leaning imbalance  Symptom Behavior  Type of Dizziness Imbalance  Frequency of Dizziness each time standing  Duration of Dizziness secnds  Aggravating Factors Sit to stand  Relieving Factors Lying supine  Occulomotor Exam  Occulomotor Alignment Normal  Spontaneous Absent  Gaze-induced Absent  Head shaking Horizontal Absent  Smooth Pursuits Intact   Saccades Intact  Vestibulo-Occular Reflex  VOR 1 Head Only (x 1 viewing) intact  VOR Cancellation Normal  Positional Testing  Dix-Hallpike Dix-Hallpike Right;Dix-Hallpike Left  Sidelying Test Sidelying Right  Horizontal Canal Testing Horizontal Canal Right;Horizontal Canal Left  Dix-Hallpike Right  Dix-Hallpike Right Duration 2sec  Dix-Hallpike Right Symptoms Upbeat Nystagmus (?rotary, very quick and gone)  Dix-Hallpike Left  Dix-Hallpike Left Duration 0  Dix-Hallpike Left Symptoms No nystagmus  Sidelying Right  Sidelying Right Duration 0  Sidelying Right Symptoms No nystagmus  Horizontal Canal Right  Horizontal Canal Right Duration 0  Horizontal Canal Right Symptoms Normal  Horizontal Canal Left  Horizontal Canal Left Duration 0  Horizontal Canal Left Symptoms Normal  Cognition  Cognition Orientation Level Oriented x 4  Orthostatics  Orthostatics Comment see flowsheet (normal)     Mobility  Bed Mobility Overal bed mobility: Needs Assistance Bed Mobility: Supine to Sit     Supine to sit: Supervision     General bed mobility comments: supervision due to incr effort and near return to supine; denied dizziness  Transfers Overall transfer level: Needs assistance Equipment used: Rolling walker (2 wheeled);None Transfers: Sit to/from Stand Sit to Stand: Min guard         General transfer comment: reports feeling unsteady, but not dizzy; wide BOS; various surfaces; with RW requires vc for safe use  Ambulation/Gait Ambulation/Gait assistance: Min assist Ambulation Distance (Feet): 60 Feet (no device; 15 RW) Assistive device: Rolling walker (2 wheeled);None Gait Pattern/deviations: Step-through pattern;Decreased stride length;Decreased weight shift to left;Drifts right/left Gait velocity: decr Gait velocity interpretation: Below normal speed for age/gender General Gait Details: as progressed, pt with significant lean to the Rt requiring min assist to maintain  balance; improved balance  with RW; limited education due to leaving for CT  Stairs            Wheelchair Mobility    Modified Rankin (Stroke Patients Only)       Balance Overall balance assessment: Needs assistance;History of Falls Sitting-balance support: No upper extremity supported;Feet supported Sitting balance-Leahy Scale: Fair     Standing balance support: No upper extremity supported Standing balance-Leahy Scale: Fair Standing balance comment: pt states she feels unsteady; no imbalance observed with static stance                             Pertinent Vitals/Pain Pain Assessment: No/denies pain    Home Living Family/patient expects to be discharged to:: Private residence Living Arrangements: Spouse/significant other (fiance) Available Help at Discharge: Friend(s) Type of Home: House (fiance's house; she is in process of moving) Home Access: Stairs to enter Entrance Stairs-Rails: Psychiatric nurse of Steps: 3 Home Layout: One level Home Equipment: [place] - single point (can borrow RW)      Prior Function Level of Independence: Independent         Comments: endorses that for years she has drifted to her right; but much worse on admission     Hand Dominance        Extremity/Trunk Assessment   Upper Extremity Assessment Upper Extremity Assessment: Defer to OT evaluation    Lower Extremity Assessment Lower Extremity Assessment: LLE deficits/detail;Generalized weakness;RLE deficits/detail RLE Sensation: history of peripheral neuropathy LLE Sensation: history of peripheral neuropathy LLE Coordination: decreased gross motor (decr heel to shin; RAM intact; )    Cervical / Trunk Assessment Cervical / Trunk Assessment: Other exceptions Cervical / Trunk Exceptions: obesity  Communication   Communication: No difficulties  Cognition Arousal/Alertness: Awake/alert Behavior During Therapy: WFL for tasks  assessed/performed Overall Cognitive Status: Within Functional Limits for tasks assessed                      General Comments      Exercises     Assessment/Plan    PT Assessment Patient needs continued PT services  PT Problem List Decreased activity tolerance;Decreased balance;Decreased mobility;Decreased knowledge of use of DME;Impaired sensation;Obesity;Decreased coordination          PT Treatment Interventions DME instruction;Gait training;Stair training;Functional mobility training;Therapeutic activities;Therapeutic exercise;Balance training;Neuromuscular re-education;Patient/family education    PT Goals (Current goals can be found in the Care Plan section)  Acute Rehab PT Goals Patient Stated Goal: return home PT Goal Formulation: With patient Time For Goal Achievement: 06/07/16 Potential to Achieve Goals: Good    Frequency Min 4X/week   Barriers to discharge        Co-evaluation               End of Session Equipment Utilized During Treatment: Gait belt Activity Tolerance: Patient tolerated treatment well Patient left: in bed;with call bell/phone within reach;with bed alarm set;with nursing/sitter in room (preparing to go to CT) Nurse Communication: Mobility status    Functional Assessment Tool Used: clinical judgement Functional Limitation: Mobility: Walking and moving around Mobility: Walking and Moving Around Current Status [id number]): At least 20 percent but less than 40 percent impaired, limited or restricted Mobility: Walking and Moving Around Goal Status [phone]): At least 1 percent but less than 20 percent impaired, limited or restricted    Time: [id number] PT Time Calculation (min) (ACUTE ONLY): 43 min   Charges:   PT Evaluation $PT Eval  Low Complexity: 1 Procedure PT Treatments $Gait Training: 8-22 mins $Canalith Rep Proc: 8-22 mins   PT G Codes:   PT G-Codes **NOT FOR INPATIENT CLASS** Functional Assessment Tool Used: clinical  judgement Functional Limitation: Mobility: Walking and moving around Mobility: Walking and Moving Around Current Status [id number]): At least 20 percent but less than 40 percent impaired, limited or restricted Mobility: Walking and Moving Around Goal Status [phone]): At least 1 percent but less than 20 percent impaired, limited or restricted    Rexanne Mano 05/31/2016, 4:44 PM Pager [phone]

## 2016-05-31 NOTE — Progress Notes (Signed)
Family Medicine Teaching Service Daily Progress Note Intern Pager: [phone]  Patient name: Stephanie Alvarado Medical record number: [id number] Date of birth: [birth year] Age: 75 y.o. Gender: female  Primary Care Provider: Gar Ponto, MD Consultants: neurology Code Status: full  Pt Overview and Major Events to Date:  12/18- admitted to FPTS  Assessment and Plan: Stephanie Alvarado is a 75 y.o. female presenting with unsteady gait. PMH is significant for previous stroke, HTN, HLD, PFO, CHF, anxiety, GERD, constipation.  Unsteady gait- concern for stroke initially, neurology assessed patient in ED. CT head negative for acute intracranial abnormality except for diffuse atrophy. MR negative for stroke. Differential includes BPPV as patient only has feelings of unsteadiness when walking vs orthostatic hypotension.  -place in observation, telemetry. Attending Dr. Mingo Amber -continuous cardiac monitoring -vitals per floor -orthostatic blood pressure -neurology following, appreciate recommendations -MRA head due to concern for posterior circulation infarct -lipid panel: total cholesterol 160, LDL 58, HDL 73, Triglycerides 144 - A1C pending -continue plavix -AM EKG with sinus bradycardia -trend troponin, first result 0.0 -PT/OT consult- vestibular rehab  SOB- patient reports being short of breath. Left calf appears swollen compared to right. Left calf painful. Has been bradycardic overall on tele but with one HR to 115.  -CTA chest -LE dopplers  HLD- reports taking a statin at home but none on med list. -add home statin in morning  HTN- norvasc 5mg  and lopressor 50mg . BP elevated in ED.  -reduce home metoprolol to 25mg  BID in setting of bradycardia -continue norvasc  HFpEF- last Echo June 2017 with EF 0000000, grade 1 diastolic dysfunction. Lasix 40mg  -continue lasix  Anxiety- takes wellbutrin at home -continue home medication  GERD- protonix at home -continue  protonix  Constipation- uses miralax at home -continue miralax daily  FEN/GI: regular diet Prophylaxis: lovenox  Disposition: pending clinical improvement  Subjective:  Stephanie Alvarado is feeling well this morning. She was able to get up with assistance and felt lightheaded/unsteady initially but this improved after walking to bathroom. She endorses feeling short of breath. She has left calf pain.   Objective: Temp:  [97.7 F (36.5 C)-98 F (36.7 C)] 98 F (36.7 C) (12/19 0919) Pulse Rate:  [58-115] 66 (12/19 0919) Resp:  [18-21] 19 (12/19 0919) BP: (109-180)/(54-83) 117/54 (12/19 0919) SpO2:  [86 %-100 %] 95 % (12/19 0919) Weight:  [198 lb 11.2 oz (90.1 kg)-200 lb (90.7 kg)] 198 lb 11.2 oz (90.1 kg) (12/18 2220) Physical Exam: General: elderly lady laying in bed in NAD Cardiovascular: RRR no MRG Respiratory: CTA bilaterally no increased work of breathing Abdomen: soft, nontender, nondistended, +BS Extremities: left calf appears larger compared to right, left calf tender to palpation. No edema or cyanosis  Laboratory:  Recent Labs Lab 05/30/16 1545 05/31/16 0641  WBC 5.7 5.6  HGB 14.5 12.6  HCT 44.1 39.1  PLT 232 215    Recent Labs Lab 05/30/16 1545 05/31/16 0641  NA 141 140  K 3.6 3.3*  CL 107 109  CO2 24 25  BUN 7 12  CREATININE 0.86 0.92  CALCIUM 9.9 9.0  PROT 7.1  --   BILITOT 1.0  --   ALKPHOS 78  --   ALT 17  --   AST 25  --   GLUCOSE 91 104*    Imaging/Diagnostic Tests: Ct Head Wo Contrast  Result Date: 05/30/2016 CLINICAL DATA:  Fatigue and unsteady gait. Near syncope. Nausea. Onset today. EXAM: CT HEAD WITHOUT CONTRAST TECHNIQUE: Contiguous axial images were obtained from the  base of the skull through the vertex without intravenous contrast. COMPARISON:  09/21/2009 FINDINGS: Brain: There is no intracranial hemorrhage, mass or evidence of acute infarction. There is mild generalized atrophy. There is moderate chronic microvascular ischemic change.  There are remote lacunar infarctions in the right putamen, right caudate, left internal capsule anterior limb. There is no significant extra-axial fluid collection. Vascular: No hyperdense vessel or unexpected calcification. Skull: Normal. Negative for fracture or focal lesion. Sinuses/Orbits: No acute finding. Other: None. IMPRESSION: No acute intracranial findings. There is moderate generalized atrophy and chronic appearing white matter hypodensities which likely represent small vessel ischemic disease. Remote lacunar infarctions. Electronically Signed   By: Andreas Newport M.D.   On: 05/30/2016 18:10   Mr Brain Wo Contrast  Result Date: 05/30/2016 CLINICAL DATA:  History of stroke.  New unstable gait and dizziness. EXAM: MRI HEAD WITHOUT CONTRAST TECHNIQUE: Multiplanar, multiecho pulse sequences of the brain and surrounding structures were obtained without intravenous contrast. COMPARISON:  Head CT 05/30/2016 Brain MRI 09/21/2009 FINDINGS: Brain: No focal diffusion restriction to indicate acute infarct. No intraparenchymal hemorrhage. There is diffuse confluent hyperintense T2-weighted signal within the brainstem and periventricular, subcortical and deep white matter, most often seen in the setting of chronic microvascular ischemia. No mass lesion or midline shift. No hydrocephalus or extra-axial fluid collection. The midline structures are normal. No age advanced or lobar predominant atrophy. Vascular: Major intracranial arterial and venous sinus flow voids are preserved. No evidence of chronic microhemorrhage or amyloid angiopathy. Focal susceptibility along the ependyma of the right lateral ventricle is favored to be due to mineralization. Skull and upper cervical spine: The visualized skull base, calvarium, upper cervical spine and extracranial soft tissues are normal. Sinuses/Orbits: No fluid levels or advanced mucosal thickening. No mastoid effusion. Normal orbits. IMPRESSION: 1. No acute  intracranial abnormality. 2. Severe chronic microvascular ischemia, unchanged from 09/21/2009. Electronically Signed   By: Ulyses Jarred M.D.   On: 05/30/2016 21:56     Steve Rattler, DO 05/31/2016, 9:30 AM PGY-1, [facility] Intern pager: [phone], text pages welcome

## 2016-05-31 NOTE — Care Management Obs Status (Signed)
[place] NOTIFICATION   Patient Details  Name: [name] MRN: [id number] Date of Birth: February 14, 1941   Medicare Observation Status Notification Given:  Yes    Pollie Friar, RN 05/31/2016, 1:47 PM

## 2016-05-31 NOTE — Care Management CC44 (Signed)
Condition Code 44 Documentation Completed  Patient Details  Name: Stephanie Alvarado MRN: [id number] Date of Birth: [birth year]   Condition Code 44 given:  Yes Patient signature on Condition Code 44 notice:  Yes Documentation of 2 MD's agreement:  Yes Code 44 added to claim:  Yes    Pollie Friar, RN 05/31/2016, 1:47 PM

## 2016-05-31 NOTE — Progress Notes (Signed)
Physical Therapy Treatment Patient Details Name: Stephanie Alvarado MRN: [id number] DOB: April 08, 1941 Today's Date: 05/31/2016    History of Present Illness 75 y.o. female presenting with unsteady gait. Denies vertigo MRI brain negative; LE dopplers negative; CT angio chest negative; orthostatics negative PMH is significant for previous stroke with resolved Lt sided weakness, HTN, HLD, PFO, CHF, anxiety    PT Comments    Patient's balance improved compared to morning session. No significant Rt lean or drift noted. Possibly Rt posterior canal BPPV treatment was effective. Patient describes overall feeling weak. No focal weakness noted. Patient has had 5 falls this year and continues to agree to OPPT for balance training.   Follow Up Recommendations  Outpatient PT;Supervision for mobility/OOB     Equipment Recommendations  None recommended by PT (pt/family report she can borrow RW)    Recommendations for Other Services OT consult     Precautions / Restrictions Precautions Precautions: Fall Precaution Comments: Pt reports 5 falls this year; each involved tripping over obstacle or her own feet    Mobility  Bed Mobility Overal bed mobility: Needs Assistance Bed Mobility: Supine to Sit     Supine to sit: Supervision     General bed mobility comments: supervision due to incr effort and near return to supine; denied dizziness  Transfers Overall transfer level: Needs assistance Equipment used: Rolling walker (2 wheeled);None Transfers: Sit to/from Stand Sit to Stand: Min guard         General transfer comment: reports feels weak, not dizzy or unsteady  Ambulation/Gait Ambulation/Gait assistance: Min assist Ambulation Distance (Feet): 50 Feet (with RW, 50 HHA) Assistive device: Rolling walker (2 wheeled);1 person hand held assist Gait Pattern/deviations: Step-through pattern;Decreased stride length Gait velocity: decr Gait velocity interpretation: Below normal speed for  age/gender General Gait Details: patient able to maintain upright with no further Rt lean (with RW or with one person assist). Denies feeling offbalance   Stairs            Wheelchair Mobility    Modified Rankin (Stroke Patients Only)       Balance Overall balance assessment: Needs assistance;History of Falls Sitting-balance support: No upper extremity supported;Feet supported Sitting balance-Leahy Scale: Fair     Standing balance support: No upper extremity supported Standing balance-Leahy Scale: Fair Standing balance comment: pt states she feels unsteady; no imbalance observed with static stance                    Cognition Arousal/Alertness: Awake/alert Behavior During Therapy: WFL for tasks assessed/performed Overall Cognitive Status: Within Functional Limits for tasks assessed                      Exercises      General Comments General comments (skin integrity, edema, etc.): Discussed prior balance deficits and recent falls. Patient agreeable to OPPT for balance training.      Pertinent Vitals/Pain Pain Assessment: No/denies pain    Home Living Family/patient expects to be discharged to:: Private residence Living Arrangements: Spouse/significant other (fiance) Available Help at Discharge: Friend(s) Type of Home: House (fiance's house; she is in process of moving) Home Access: Stairs to enter Entrance Stairs-Rails: Right;Left Home Layout: [place]: [place] - single point (can borrow RW)      Prior Function Level of Independence: Independent      Comments: endorses that for years she has drifted to her right; but much worse on admission   PT Goals (current goals can  now be found in the care plan section) Acute Rehab PT Goals Patient Stated Goal: return home PT Goal Formulation: With patient Time For Goal Achievement: 06/07/16 Potential to Achieve Goals: Good Progress towards PT goals: Progressing toward goals     Frequency    Min 4X/week      PT Plan Current plan remains appropriate    Co-evaluation             End of Session Equipment Utilized During Treatment: Gait belt Activity Tolerance: Patient tolerated treatment well Patient left: with call bell/phone within reach;in chair;with chair alarm set;with family/visitor present (preparing to go to CT)     Time: [id number] PT Time Calculation (min) (ACUTE ONLY): 27 min  Charges:  $Gait Training: 23-37 mins $Canalith Rep Proc: 8-22 mins                    G Codes:  Functional Assessment Tool Used: clinical judgement Functional Limitation: Mobility: Walking and moving around Mobility: Walking and Moving Around Current Status [phone]): At least 20 percent but less than 40 percent impaired, limited or restricted Mobility: Walking and Moving Around Goal Status [phone]): At least 1 percent but less than 20 percent impaired, limited or restricted   Rexanne Mano 05/31/2016, 4:52 PM Pager [phone]

## 2016-05-31 NOTE — Care Management Note (Signed)
Case Management Note  Patient Details  Name: Stephanie Alvarado MRN: [id number] Date of Birth: Dec 03, 1940  Subjective/Objective:   Pt in with ataxia. She is from home with her fiance.                  Action/Plan: PT recommending Outpatient therapy. CM following for d/c needs.   Expected Discharge Date:                  Expected Discharge Plan:  Home/Self Care  In-House Referral:     Discharge planning Services     Post Acute Care Choice:    Choice offered to:     DME Arranged:    DME Agency:     HH Arranged:    HH Agency:     Status of Service:  In process, will continue to follow  If discussed at Long Length of Stay Meetings, dates discussed:    Additional Comments:  Pollie Friar, RN 05/31/2016, 3:05 PM

## 2016-06-01 ENCOUNTER — Observation Stay (HOSPITAL_COMMUNITY): Payer: PPO

## 2016-06-01 DIAGNOSIS — R42 Dizziness and giddiness: Secondary | ICD-10-CM | POA: Diagnosis not present

## 2016-06-01 DIAGNOSIS — R270 Ataxia, unspecified: Secondary | ICD-10-CM | POA: Diagnosis not present

## 2016-06-01 DIAGNOSIS — R0600 Dyspnea, unspecified: Secondary | ICD-10-CM | POA: Diagnosis not present

## 2016-06-01 LAB — CBC
HCT: 39.1 % (ref 36.0–46.0)
HEMOGLOBIN: 12.6 g/dL (ref 12.0–15.0)
MCH: 30.1 pg (ref 26.0–34.0)
MCHC: 32.2 g/dL (ref 30.0–36.0)
MCV: 93.5 fL (ref 78.0–100.0)
PLATELETS: 215 10*3/uL (ref 150–400)
RBC: 4.18 MIL/uL (ref 3.87–5.11)
RDW: 16.2 % — ABNORMAL HIGH (ref 11.5–15.5)
WBC: 6.1 10*3/uL (ref 4.0–10.5)

## 2016-06-01 LAB — BASIC METABOLIC PANEL
ANION GAP: 5 (ref 5–15)
BUN: 14 mg/dL (ref 6–20)
CHLORIDE: 108 mmol/L (ref 101–111)
CO2: 26 mmol/L (ref 22–32)
Calcium: 9.4 mg/dL (ref 8.9–10.3)
Creatinine, Ser: 0.8 mg/dL (ref 0.44–1.00)
GFR calc Af Amer: >60 mL/min (ref >60)
GLUCOSE: 97 mg/dL (ref 65–99)
POTASSIUM: 3.5 mmol/L (ref 3.5–5.1)
Sodium: 139 mmol/L (ref 135–145)

## 2016-06-01 LAB — RPR: RPR Ser Ql: NONREACTIVE

## 2016-06-01 MED ORDER — GADOBENATE DIMEGLUMINE 529 MG/ML IV SOLN
20.0000 mL | Freq: Once | INTRAVENOUS | Status: AC
  Administered 2016-06-01: 18 mL via INTRAVENOUS

## 2016-06-01 MED ORDER — METOPROLOL TARTRATE 25 MG PO TABS: 25.0000 mg | ORAL_TABLET | Freq: Two times a day (BID) | ORAL | 0 refills | Status: DC

## 2016-06-01 NOTE — Progress Notes (Signed)
Physical Therapy Treatment Patient Details Name: Stephanie Alvarado MRN: [id number] DOB: [birth year] Today's Date: 06/01/2016    History of Present Illness 75 y.o. female presenting with unsteady gait. Denies vertigo MRI brain negative; LE dopplers negative; CT angio chest negative; orthostatics negative PMH is significant for previous stroke with resolved Lt sided weakness, HTN, HLD, PFO, CHF, anxiety    PT Comments    Patient with noted mild left foot dragging during gait today. She is able to correct with constant cues, however remains unsteady. Strength Lt dorsiflexion 4/5, so ? If due to inattention (pt is highly distractible). Agrees to use of rolling walker and follow-up OPPT. (Has had 5 falls this year).   Follow Up Recommendations  Outpatient PT;Supervision for mobility/OOB     Equipment Recommendations  Rolling walker with 5" wheels (fiance not sure RW they would borrow is approp size)    Recommendations for Other Services OT consult     Precautions / Restrictions Precautions Precautions: Fall Precaution Comments: Pt reports 5 falls this year; each involved tripping over obstacle or her own feet Restrictions Weight Bearing Restrictions: No    Mobility  Bed Mobility                  Transfers Overall transfer level: Needs assistance Equipment used: Rolling walker (2 wheeled);None Transfers: Sit to/from Stand Sit to Stand: Supervision         General transfer comment: denies dizziness; vc for safe use of RW; repeated x 3 with pt return demonstrating  Ambulation/Gait Ambulation/Gait assistance: Min assist Ambulation Distance (Feet): 90 Feet (HHA; 90 with RW) Assistive device: Rolling walker (2 wheeled);1 person hand held assist Gait Pattern/deviations: Step-through pattern;Decreased stride length;Decreased dorsiflexion - left;Wide base of support Gait velocity: decr; able to mildly incr velocity with cues; did not maintain Gait velocity interpretation:  Below normal speed for age/gender General Gait Details: not drifting or leaning Rt, however with slight buckling of Rt knee causing imbalance; educated on use of RW in tight spaces (tight turns and stepping sideways)   Stairs         General stair comments: Patient preparing to go down for MRI, therefore did not go to stairs; pt able to correctly state how to ascend; educated in safe descent with LLE weakness. Fiance present and aware to assist pt on steps (with rail)  Wheelchair Mobility    Modified Rankin (Stroke Patients Only)       Balance Overall balance assessment: Needs assistance;History of Falls         Standing balance support: No upper extremity supported Standing balance-Leahy Scale: Fair Standing balance comment: states she feels unsteady; guarded stance with wide BOS                    Cognition Arousal/Alertness: Awake/alert Behavior During Therapy: WFL for tasks assessed/performed Overall Cognitive Status: Within Functional Limits for tasks assessed                      Exercises      General Comments General comments (skin integrity, edema, etc.): Fiance present and educated on pt using RW and correct technique.       Pertinent Vitals/Pain Pain Assessment: No/denies pain    Home Living                      Prior Function            PT Goals (current goals can now  be found in the care plan section) Acute Rehab PT Goals Patient Stated Goal: return home Time For Goal Achievement: 06/07/16 Progress towards PT goals: Progressing toward goals    Frequency    Min 4X/week      PT Plan Current plan remains appropriate    Co-evaluation             End of Session Equipment Utilized During Treatment: Gait belt Activity Tolerance: Patient tolerated treatment well Patient left: with call bell/phone within reach;in chair;with chair alarm set;with family/visitor present (preparing to go to CT)     Time:  [id number] PT Time Calculation (min) (ACUTE ONLY): 22 min  Charges:  $Gait Training: 8-22 mins                    G CodesJeanie Cooks [name] 06/22/2016, 10:26 AM Pager [phone]

## 2016-06-01 NOTE — Progress Notes (Signed)
Patient is being d/c home home. Dc instructions given to patient. Patient verbalized understanding.

## 2016-06-01 NOTE — Progress Notes (Signed)
Patient's fiancee told Probation officer that the MD will change their stay to in patient. MD paged to get clarification. At this time, patient is being d/c home.

## 2016-06-01 NOTE — Care Management Note (Signed)
Case Management Note  Patient Details  Name: Stephanie Alvarado MRN: [id number] Date of Birth: [birth year]  Subjective/Objective:                    Action/Plan: CM consulted for Outpatient rehab. CM met with the patient and she is interested in going to outpatient rehab at [place]. Orders placed in EPIC and information on the AVS. Pt with orders for walker. Pt states her fiance has one at home she can use. Pt refused walker.   Expected Discharge Date:                  Expected Discharge Plan:  Home/Self Care  In-House Referral:     Discharge planning Services  CM Consult  Post Acute Care Choice:    Choice offered to:     DME Arranged:    DME Agency:     HH Arranged:    [place] Agency:     Status of Service:  Completed, signed off  If discussed at [employer] of Stay Meetings, dates discussed:    Additional Comments:  Pollie Friar, RN 06/01/2016, 2:55 PM

## 2016-06-01 NOTE — Progress Notes (Signed)
Family Medicine Teaching Service Daily Progress Note Intern Pager: [phone]  Patient name: Stephanie Alvarado Medical record number: [id number] Date of birth: 1941-01-23 Age: 75 y.o. Gender: female  Primary Care Provider: Gar Ponto, MD Consultants: neurology Code Status: full  Pt Overview and Major Events to Date:  12/18- admitted to FPTS  Assessment and Plan: Stephanie Alvarado is a 75 y.o. female presenting with unsteady gait. PMH is significant for previous stroke, HTN, HLD, PFO, CHF, anxiety, GERD, constipation.  Unsteady gait- concern for stroke initially, neurology assessed patient in ED. CT head negative for acute intracranial abnormality except for diffuse atrophy. MR negative for stroke. Differential includes BPPV as patient only has feelings of unsteadiness when walking vs orthostatic hypotension.  -continuous cardiac monitoring -vitals per floor -orthostatic blood pressure- repeat  -neurology following, appreciate recommendations -MRI head w/wo contrast today - A1C 5.8 -continue plavix -troponin negative x 3 -PT/OT consult for vestibular rehab- no follow up needed  SOB- patient reports being short of breath. Left calf appears swollen compared to right. Left calf painful. Has been bradycardic overall on tele but with one HR to 115.  -CTA chest negative for PE -LE dopplers no signs of DVT  HLD- reports taking a statin at home but none on med list. -add home statin in morning  HTN- norvasc 5mg  and lopressor 50mg . BP elevated in ED.  -reduce home metoprolol to 25mg  BID in setting of bradycardia -continue norvasc  FEN/GI: regular diet Prophylaxis: lovenox  Disposition: pending clinical improvement  Subjective:  Stephanie Alvarado is feeling well this morning. She is back to her baseline. She is hoping to go home today.  Objective: Temp:  [97.9 F (36.6 C)-98.3 F (36.8 C)] 98.3 F (36.8 C) (12/20 0114) Pulse Rate:  [60-72] 72 (12/20 0114) Resp:  [18-20] 20 (12/20  0114) BP: (109-133)/(54-72) 122/57 (12/20 0114) SpO2:  [94 %-99 %] 95 % (12/20 0114) Physical Exam: General: elderly lady sitting up inchair in NAD Cardiovascular: RRR no MRG Respiratory: CTA bilaterally no increased work of breathing Abdomen: soft, nontender, nondistended, +BS Extremities: left calf appears larger compared to right, left calf tender to palpation. No edema or cyanosis  Laboratory:  Recent Labs Lab 05/30/16 1545 05/31/16 0641 06/01/16 0223  WBC 5.7 5.6 6.1  HGB 14.5 12.6 12.6  HCT 44.1 39.1 39.1  PLT 232 215 215    Recent Labs Lab 05/30/16 1545 05/31/16 0641 06/01/16 0223  NA 141 140 139  K 3.6 3.3* 3.5  CL 107 109 108  CO2 24 25 26   BUN 7 12 14   CREATININE 0.86 0.92 0.80  CALCIUM 9.9 9.0 9.4  PROT 7.1  --   --   BILITOT 1.0  --   --   ALKPHOS 78  --   --   ALT 17  --   --   AST 25  --   --   GLUCOSE 91 104* 97   lipid panel: total cholesterol 160, LDL 58, HDL 73, Triglycerides 144  Imaging/Diagnostic Tests: Ct Angio Chest Pe W Or Wo Contrast  Result Date: 05/31/2016 CLINICAL DATA:  75 year old female with a history of shortness of breath with bilateral lower extremity pain and swelling EXAM: CT ANGIOGRAPHY CHEST WITH CONTRAST TECHNIQUE: Multidetector CT imaging of the chest was performed using the standard protocol during bolus administration of intravenous contrast. Multiplanar CT image reconstructions and MIPs were obtained to evaluate the vascular anatomy. CONTRAST:  80 cc Isovue 370 COMPARISON:  CT abdomen 04/05/2013 FINDINGS: Cardiovascular: Heart: No  cardiomegaly. No pericardial fluid/thickening. Calcifications of the left main, left anterior descending, circumflex, right coronary arteries. Aorta: Calcifications of the aortic arch. No aneurysm. Calcifications of descending thoracic aorta. No dissection flap. No periaortic fluid. Pulmonary arteries: No central, lobar, segmental, or proximal subsegmental filling defects. Mediastinum/Nodes:  Mediastinal lymph nodes are present, none of which are enlarged by CT size criteria. Small hiatal hernia Low-density nodule of the right thyroid measures 2.9 cm. Lungs/Pleura: Central airways are clear. No pleural effusion. No confluent left-sided airspace disease. No pneumothorax. Upper Abdomen: Unremarkable. Musculoskeletal: No displaced fracture. Degenerative changes of the spine. Review of the MIP images confirms the above findings. IMPRESSION: Study is negative for pulmonary emboli. Aortic atherosclerosis with left main and 3 vessel coronary artery disease. Referral for office based assessment of cardiac risk factors may be considered if not already performed. Right-sided thyroid nodule measures 2.9 cm, poorly characterized by CT. If warranted, referral for ultrasound evaluation may be considered. Small hiatal hernia Signed, Dulcy Fanny. Earleen Newport, DO [facility] Electronically Signed   By: Corrie Mckusick D.O.   On: 05/31/2016 12:12     Stephanie Rattler, DO 06/01/2016, 4:31 AM PGY-1, [place] Intern pager: [phone], text pages welcome

## 2016-06-01 NOTE — Evaluation (Signed)
Occupational Therapy Evaluation Patient Details Name: Stephanie Alvarado MRN: [id number] DOB: 1940-11-18 Today's Date: 06/01/2016    History of Present Illness 75 y.o. female presenting with unsteady gait. Denies vertigo MRI brain negative; LE dopplers negative; CT angio chest negative; orthostatics negative PMH is significant for previous stroke with resolved Lt sided weakness, HTN, HLD, PFO, CHF, anxiety   Clinical Impression   Patient evaluated by Occupational Therapy with no further acute OT needs identified. All education has been completed and the patient has no further questions. See below for any follow-up Occupational Therapy or equipment needs. OT to sign off. Thank you for referral.      Follow Up Recommendations  No OT follow up    Equipment Recommendations  3 in 1 bedside commode    Recommendations for Other Services       Precautions / Restrictions Precautions Precautions: Fall Precaution Comments: Pt reports 5 falls this year; each involved tripping over obstacle or her own feet      Mobility Bed Mobility               General bed mobility comments: in chair s/p shower on arrival  Transfers Overall transfer level: Needs assistance Equipment used: Rolling walker (2 wheeled) Transfers: Sit to/from Stand Sit to Stand: Supervision              Balance                                            ADL Overall ADL's : Needs assistance/impaired Eating/Feeding: Independent   Grooming: Wash/dry hands;Wash/dry face;Modified independent           Upper Body Dressing : Supervision/safety   Lower Body Dressing: Supervision/safety   Toilet Transfer: Supervision/safety       Tub/ Shower Transfer: Nurse, learning disability Details (indicate cue type and reason): educated on shower seat placement Functional mobility during ADLs: Research officer, trade union      Pertinent Vitals/Pain Pain  Assessment: No/denies pain     Hand Dominance Right   Extremity/Trunk Assessment Upper Extremity Assessment Upper Extremity Assessment: Overall WFL for tasks assessed   Lower Extremity Assessment Lower Extremity Assessment: Defer to PT evaluation       Communication Communication Communication: No difficulties   Cognition Arousal/Alertness: Awake/alert Behavior During Therapy: WFL for tasks assessed/performed Overall Cognitive Status: Within Functional Limits for tasks assessed                     General Comments       Exercises       Shoulder Instructions      Home Living Family/patient expects to be discharged to:: Private residence Living Arrangements: Spouse/significant other Available Help at Discharge: Friend(s) Type of Home: House Home Access: Stairs to enter Technical brewer of Steps: 3 Entrance Stairs-Rails: Right;Left Home Layout: One level     Bathroom Shower/Tub: Teacher, early years/pre: Standard     Home Equipment: Cane - single point          Prior Functioning/Environment Level of Independence: Independent                 OT Problem List:     OT Treatment/Interventions:      OT Goals(Current goals can be  found in the care plan section) Acute Rehab OT Goals Patient Stated Goal: return home  OT Frequency:     Barriers to D/C:            Co-evaluation              End of Session Equipment Utilized During Treatment: Gait belt;Rolling walker Nurse Communication: Mobility status;Precautions  Activity Tolerance: Patient tolerated treatment well Patient left: in chair;with call bell/phone within reach (chair alarm pad no box in room RN aware)   Time: 1410-1420 OT Time Calculation (min): 10 min Charges:  OT General Charges $OT Visit: 1 Procedure OT Evaluation $OT Eval Low Complexity: 1 Procedure G-Codes:    Parke Poisson B 06-07-2016, 3:38 PM   Jeri Modena   OTR/L [phone] Office: [phone] .

## 2016-06-01 NOTE — Progress Notes (Signed)
OT NOTE-  GCODE ENTRY    06/01/16 1500  OT G-codes **NOT FOR INPATIENT CLASS**  Functional Assessment Tool Used clincial judgement  Functional Limitation Self care  Self Care Current Status [id number]) CI  Self Care Goal Status [id number]) CI  Self Care Discharge Status [id number]) CI          Jeri Modena   OTR/L Pager: [phone] Office: [phone] .

## 2016-06-01 NOTE — Discharge Summary (Signed)
[facility] Discharge Summary  Patient name: Stephanie Alvarado Medical record number: [id number] Date of birth: 07/31/1940 Age: 75 y.o. Gender: female Date of Admission: 05/30/2016  Date of Discharge: 06/01/16  Admitting Physician: Alveda Reasons, MD  Primary Care Provider: Gar Ponto, MD Consultants: neurology  Indication for Hospitalization: unsteady gait  Discharge Diagnoses/Problem List:  Unsteady gait Shortness of breath HLD HTN  Disposition: home  Discharge Condition: stable  Discharge Exam: see progress note from day of discharge  Brief Hospital Course:  Stephanie Alvarado is a 43 year old with PMH of previous stroke, HTN, HLD, PFO, CHF, anxiety, GERD, and constipation who presented to [facility] ED on 12/18 with unsteady gait and concerns for new stroke. Code stroke was called. Initial head imaging was negative. Patient was hypertensive in ED. She was admitted to [facility] for management. Her unsteady gait symptoms improved during hospitalization and there were no other neurologic deficits on exam. Her symptoms were not consistent with vertigo. She reported shortness of breath and leg swelling. CTA chest was negative for PE and LE dopplers were negative for DVT. She was bradycardic during her admission and her home beta blocker dose was cut in half due to this. A repeat MRI Head with and without contrast was performed to assess for latent signs of stroke but was negative for acute abnormalities. PT/OT saw the patient and recommended home health PT and use of walker. She was discharged home in stable condition on 06/01/16.   Issues for Follow Up:  1. Follow up blood pressure control on home norvasc and lopressor  2. Follow up bradycardia on reduced beta blocker dose  Significant Procedures: none  Significant Labs and Imaging:   Recent Labs Lab 05/30/16 1545 05/31/16 0641 06/01/16 0223  WBC 5.7 5.6 6.1  HGB 14.5 12.6 12.6  HCT 44.1 39.1 39.1  PLT  232 215 215    Recent Labs Lab 05/30/16 1545 05/31/16 0641 06/01/16 0223  NA 141 140 139  K 3.6 3.3* 3.5  CL 107 109 108  CO2 24 25 26   GLUCOSE 91 104* 97  BUN 7 12 14   CREATININE 0.86 0.92 0.80  CALCIUM 9.9 9.0 9.4  ALKPHOS 78  --   --   AST 25  --   --   ALT 17  --   --   ALBUMIN 4.1  --   --    A1C 5.8 lipid panel: total cholesterol 160, LDL 58, HDL 73, Triglycerides 144   Ct Head Wo Contrast  Result Date: 05/30/2016 CLINICAL DATA:  Fatigue and unsteady gait. Near syncope. Nausea. Onset today. EXAM: CT HEAD WITHOUT CONTRAST TECHNIQUE: Contiguous axial images were obtained from the base of the skull through the vertex without intravenous contrast. COMPARISON:  09/21/2009 FINDINGS: Brain: There is no intracranial hemorrhage, mass or evidence of acute infarction. There is mild generalized atrophy. There is moderate chronic microvascular ischemic change. There are remote lacunar infarctions in the right putamen, right caudate, left internal capsule anterior limb. There is no significant extra-axial fluid collection. Vascular: No hyperdense vessel or unexpected calcification. Skull: Normal. Negative for fracture or focal lesion. Sinuses/Orbits: No acute finding. Other: None. IMPRESSION: No acute intracranial findings. There is moderate generalized atrophy and chronic appearing white matter hypodensities which likely represent small vessel ischemic disease. Remote lacunar infarctions. Electronically Signed   By: Andreas Newport M.D.   On: 05/30/2016 18:10   Ct Angio Chest Pe W Or Wo Contrast  Result Date: 05/31/2016 CLINICAL  DATA:  75 year old female with a history of shortness of breath with bilateral lower extremity pain and swelling EXAM: CT ANGIOGRAPHY CHEST WITH CONTRAST TECHNIQUE: Multidetector CT imaging of the chest was performed using the standard protocol during bolus administration of intravenous contrast. Multiplanar CT image reconstructions and MIPs were obtained to  evaluate the vascular anatomy. CONTRAST:  80 cc Isovue 370 COMPARISON:  CT abdomen 04/05/2013 FINDINGS: Cardiovascular: Heart: No cardiomegaly. No pericardial fluid/thickening. Calcifications of the left main, left anterior descending, circumflex, right coronary arteries. Aorta: Calcifications of the aortic arch. No aneurysm. Calcifications of descending thoracic aorta. No dissection flap. No periaortic fluid. Pulmonary arteries: No central, lobar, segmental, or proximal subsegmental filling defects. Mediastinum/Nodes: Mediastinal lymph nodes are present, none of which are enlarged by CT size criteria. Small hiatal hernia Low-density nodule of the right thyroid measures 2.9 cm. Lungs/Pleura: Central airways are clear. No pleural effusion. No confluent left-sided airspace disease. No pneumothorax. Upper Abdomen: Unremarkable. Musculoskeletal: No displaced fracture. Degenerative changes of the spine. Review of the MIP images confirms the above findings. IMPRESSION: Study is negative for pulmonary emboli. Aortic atherosclerosis with left main and 3 vessel coronary artery disease. Referral for office based assessment of cardiac risk factors may be considered if not already performed. Right-sided thyroid nodule measures 2.9 cm, poorly characterized by CT. If warranted, referral for ultrasound evaluation may be considered. Small hiatal hernia Signed, Dulcy Fanny. Earleen Newport, DO [facility] Electronically Signed   By: Corrie Mckusick D.O.   On: 05/31/2016 12:12   Mr Brain Wo Contrast  Result Date: 05/30/2016 CLINICAL DATA:  History of stroke.  New unstable gait and dizziness. EXAM: MRI HEAD WITHOUT CONTRAST TECHNIQUE: Multiplanar, multiecho pulse sequences of the brain and surrounding structures were obtained without intravenous contrast. COMPARISON:  Head CT 05/30/2016 Brain MRI 09/21/2009 FINDINGS: Brain: No focal diffusion restriction to indicate acute infarct. No  intraparenchymal hemorrhage. There is diffuse confluent hyperintense T2-weighted signal within the brainstem and periventricular, subcortical and deep white matter, most often seen in the setting of chronic microvascular ischemia. No mass lesion or midline shift. No hydrocephalus or extra-axial fluid collection. The midline structures are normal. No age advanced or lobar predominant atrophy. Vascular: Major intracranial arterial and venous sinus flow voids are preserved. No evidence of chronic microhemorrhage or amyloid angiopathy. Focal susceptibility along the ependyma of the right lateral ventricle is favored to be due to mineralization. Skull and upper cervical spine: The visualized skull base, calvarium, upper cervical spine and extracranial soft tissues are normal. Sinuses/Orbits: No fluid levels or advanced mucosal thickening. No mastoid effusion. Normal orbits. IMPRESSION: 1. No acute intracranial abnormality. 2. Severe chronic microvascular ischemia, unchanged from 09/21/2009. Electronically Signed   By: Ulyses Jarred M.D.   On: 05/30/2016 21:56   Mr Brain W Contrast  Result Date: 06/01/2016 CLINICAL DATA:  Dizziness EXAM: MRI HEAD WITH CONTRAST TECHNIQUE: Multiplanar, multiecho pulse sequences of the brain and surrounding structures were obtained with intravenous contrast. CONTRAST:  66mL MULTIHANCE GADOBENATE DIMEGLUMINE 529 MG/ML IV SOLN COMPARISON:  MRI brain without contrast 05/30/2016 FINDINGS: Normal enhancement following contrast administration. No enhancing mass lesion. Leptomeningeal enhancement within normal limits. Normal dural venous sinus enhancement. Complete evaluation the brain was performed on the prior unenhanced study. No new findings. IMPRESSION: No enhancing mass lesion following contrast administration. Electronically Signed   By: Franchot Gallo M.D.   On: 06/01/2016 13:03     Results/Tests Pending at Time of Discharge: none  Discharge Medications:  Allergies as  of  06/01/2016      Reactions   Codeine Itching   REACTION: itch   Lipitor [atorvastatin] Other (See Comments)   myalgias      Medication List    STOP taking these medications   diphenhydramine-acetaminophen 25-500 MG Tabs tablet Commonly known as:  TYLENOL PM     TAKE these medications   amLODipine 5 MG tablet Commonly known as:  NORVASC Take 5 mg by mouth daily.   buPROPion 150 MG 24 hr tablet Commonly known as:  WELLBUTRIN XL Take 150 mg by mouth 2 (two) times daily.   clopidogrel 75 MG tablet Commonly known as:  PLAVIX Take 1 tablet (75 mg total) by mouth daily.   furosemide 40 MG tablet Commonly known as:  LASIX Take 40 mg by mouth daily as needed for edema.   gabapentin 300 MG capsule Commonly known as:  NEURONTIN Take 300 mg by mouth 3 (three) times daily.   isosorbide mononitrate 30 MG 24 hr tablet Commonly known as:  IMDUR Take 30 mg by mouth daily.   metoprolol tartrate 25 MG tablet Commonly known as:  LOPRESSOR Take 1 tablet (25 mg total) by mouth 2 (two) times daily. What changed:  medication strength  how much to take   nitroGLYCERIN 0.4 MG SL tablet Commonly known as:  NITROSTAT Place 0.4 mg under the tongue every 5 (five) minutes as needed for chest pain (TAKE UP TO 3 DOSES BEFORE CALLING 911).   pantoprazole 40 MG tablet Commonly known as:  PROTONIX Take 40 mg by mouth daily.   polyethylene glycol powder powder Commonly known as:  GLYCOLAX/MIRALAX Take 17 g by mouth daily as needed for moderate constipation.       Discharge Instructions: Please refer to Patient Instructions section of EMR for full details.  Patient was counseled important signs and symptoms that should prompt return to medical care, changes in medications, dietary instructions, activity restrictions, and follow up appointments.   Follow-Up Appointments: Follow-up Information    [facility] Follow up.   Specialty:  Rehabilitation Why:   they will contact you for the first appointment. Contact information: [address] [place], [place] [id number] [place] [id number] [place], DO 06/01/2016, 10:48 PM PGY-1, [place]

## 2016-06-01 NOTE — Discharge Instructions (Signed)
You were admitted to rule out stroke, there was no findings consistent with stroke. For your ataxia, we recommend physical therapy as outpatient. Please follow up with physical therapy. Please follow up with Dr. Olena Heckle, your primary care physician.

## 2016-08-08 DIAGNOSIS — M170 Bilateral primary osteoarthritis of knee: Secondary | ICD-10-CM | POA: Diagnosis not present

## 2016-08-08 DIAGNOSIS — M25562 Pain in left knee: Secondary | ICD-10-CM | POA: Diagnosis not present

## 2016-08-15 DIAGNOSIS — M25551 Pain in right hip: Secondary | ICD-10-CM | POA: Diagnosis not present

## 2016-08-29 ENCOUNTER — Ambulatory Visit: Payer: PPO | Admitting: Physician Assistant

## 2016-09-27 ENCOUNTER — Ambulatory Visit: Payer: PPO | Admitting: Physician Assistant

## 2016-10-27 ENCOUNTER — Other Ambulatory Visit (HOSPITAL_COMMUNITY)
Admission: RE | Admit: 2016-10-27 | Discharge: 2016-10-27 | Disposition: A | Discharge status: Home / Self Care | Payer: PPO | Source: Ambulatory Visit | Attending: Family Medicine | Admitting: Family Medicine

## 2016-10-27 DIAGNOSIS — J9621 Acute and chronic respiratory failure with hypoxia: Secondary | ICD-10-CM | POA: Diagnosis not present

## 2016-10-27 DIAGNOSIS — R072 Precordial pain: Secondary | ICD-10-CM | POA: Insufficient documentation

## 2016-10-27 DIAGNOSIS — E6609 Other obesity due to excess calories: Secondary | ICD-10-CM | POA: Diagnosis not present

## 2016-10-27 DIAGNOSIS — Z6832 Body mass index (BMI) 32.0-32.9, adult: Secondary | ICD-10-CM | POA: Diagnosis not present

## 2016-10-27 DIAGNOSIS — R0602 Shortness of breath: Secondary | ICD-10-CM | POA: Diagnosis not present

## 2016-10-27 DIAGNOSIS — N183 Chronic kidney disease, stage 3 (moderate): Secondary | ICD-10-CM | POA: Diagnosis not present

## 2016-10-27 DIAGNOSIS — G4733 Obstructive sleep apnea (adult) (pediatric): Secondary | ICD-10-CM | POA: Diagnosis not present

## 2016-10-27 DIAGNOSIS — I2510 Atherosclerotic heart disease of native coronary artery without angina pectoris: Secondary | ICD-10-CM | POA: Diagnosis not present

## 2016-10-27 LAB — CBC
HEMATOCRIT: 43.1 % (ref 36.0–46.0)
Hemoglobin: 13.9 g/dL (ref 12.0–15.0)
MCH: 30.3 pg (ref 26.0–34.0)
MCHC: 32.3 g/dL (ref 30.0–36.0)
MCV: 94.1 fL (ref 78.0–100.0)
PLATELETS: 204 10*3/uL (ref 150–400)
RBC: 4.58 MIL/uL (ref 3.87–5.11)
RDW: 15.7 % — AB (ref 11.5–15.5)
WBC: 7.5 10*3/uL (ref 4.0–10.5)

## 2016-10-27 LAB — COMPREHENSIVE METABOLIC PANEL
ALBUMIN: 4.2 g/dL (ref 3.5–5.0)
ALT: 14 U/L (ref 14–54)
AST: 20 U/L (ref 15–41)
Alkaline Phosphatase: 88 U/L (ref 38–126)
Anion gap: 8 (ref 5–15)
BUN: 15 mg/dL (ref 6–20)
CALCIUM: 10.1 mg/dL (ref 8.9–10.3)
CO2: 30 mmol/L (ref 22–32)
CREATININE: 0.93 mg/dL (ref 0.44–1.00)
Chloride: 105 mmol/L (ref 101–111)
GFR calc Af Amer: >60 mL/min (ref >60)
GFR calc non Af Amer: 58 mL/min — ABNORMAL LOW (ref >60)
GLUCOSE: 102 mg/dL — AB (ref 65–99)
Potassium: 3.9 mmol/L (ref 3.5–5.1)
Sodium: 143 mmol/L (ref 135–145)
Total Bilirubin: 0.8 mg/dL (ref 0.3–1.2)
Total Protein: 7.7 g/dL (ref 6.5–8.1)

## 2016-10-27 LAB — TROPONIN I: Troponin I: <0.03 ng/mL (ref <0.03)

## 2016-10-27 LAB — BRAIN NATRIURETIC PEPTIDE: B Natriuretic Peptide: 100 pg/mL (ref 0.0–100.0)

## 2016-10-27 LAB — TSH: TSH: 1.368 u[IU]/mL (ref 0.350–4.500)

## 2016-10-28 DIAGNOSIS — J449 Chronic obstructive pulmonary disease, unspecified: Secondary | ICD-10-CM | POA: Diagnosis not present

## 2016-10-28 DIAGNOSIS — G4733 Obstructive sleep apnea (adult) (pediatric): Secondary | ICD-10-CM | POA: Diagnosis not present

## 2016-11-03 DIAGNOSIS — R072 Precordial pain: Secondary | ICD-10-CM | POA: Diagnosis not present

## 2016-11-03 DIAGNOSIS — R0602 Shortness of breath: Secondary | ICD-10-CM | POA: Diagnosis not present

## 2016-11-03 DIAGNOSIS — I081 Rheumatic disorders of both mitral and tricuspid valves: Secondary | ICD-10-CM | POA: Diagnosis not present

## 2016-11-03 DIAGNOSIS — G4733 Obstructive sleep apnea (adult) (pediatric): Secondary | ICD-10-CM | POA: Diagnosis not present

## 2016-11-03 DIAGNOSIS — J449 Chronic obstructive pulmonary disease, unspecified: Secondary | ICD-10-CM | POA: Diagnosis not present

## 2016-11-03 DIAGNOSIS — R079 Chest pain, unspecified: Secondary | ICD-10-CM | POA: Diagnosis not present

## 2016-11-09 DIAGNOSIS — J209 Acute bronchitis, unspecified: Secondary | ICD-10-CM | POA: Diagnosis not present

## 2016-11-28 DIAGNOSIS — J449 Chronic obstructive pulmonary disease, unspecified: Secondary | ICD-10-CM | POA: Diagnosis not present

## 2016-11-28 DIAGNOSIS — G4733 Obstructive sleep apnea (adult) (pediatric): Secondary | ICD-10-CM | POA: Diagnosis not present

## 2016-12-04 DIAGNOSIS — J449 Chronic obstructive pulmonary disease, unspecified: Secondary | ICD-10-CM | POA: Diagnosis not present

## 2016-12-04 DIAGNOSIS — G4733 Obstructive sleep apnea (adult) (pediatric): Secondary | ICD-10-CM | POA: Diagnosis not present

## 2016-12-16 DIAGNOSIS — N183 Chronic kidney disease, stage 3 (moderate): Secondary | ICD-10-CM | POA: Diagnosis not present

## 2016-12-16 DIAGNOSIS — F331 Major depressive disorder, recurrent, moderate: Secondary | ICD-10-CM | POA: Diagnosis not present

## 2016-12-16 DIAGNOSIS — Z6832 Body mass index (BMI) 32.0-32.9, adult: Secondary | ICD-10-CM | POA: Diagnosis not present

## 2016-12-16 DIAGNOSIS — K210 Gastro-esophageal reflux disease with esophagitis: Secondary | ICD-10-CM | POA: Diagnosis not present

## 2016-12-16 DIAGNOSIS — E039 Hypothyroidism, unspecified: Secondary | ICD-10-CM | POA: Diagnosis not present

## 2016-12-16 DIAGNOSIS — I10 Essential (primary) hypertension: Secondary | ICD-10-CM | POA: Diagnosis not present

## 2016-12-16 DIAGNOSIS — Z9189 Other specified personal risk factors, not elsewhere classified: Secondary | ICD-10-CM | POA: Diagnosis not present

## 2016-12-16 DIAGNOSIS — J449 Chronic obstructive pulmonary disease, unspecified: Secondary | ICD-10-CM | POA: Diagnosis not present

## 2016-12-16 DIAGNOSIS — E782 Mixed hyperlipidemia: Secondary | ICD-10-CM | POA: Diagnosis not present

## 2016-12-16 DIAGNOSIS — G4733 Obstructive sleep apnea (adult) (pediatric): Secondary | ICD-10-CM | POA: Diagnosis not present

## 2016-12-16 DIAGNOSIS — Z0001 Encounter for general adult medical examination with abnormal findings: Secondary | ICD-10-CM | POA: Diagnosis not present

## 2016-12-19 ENCOUNTER — Ambulatory Visit (HOSPITAL_COMMUNITY)
Admission: RE | Admit: 2016-12-19 | Discharge: 2016-12-19 | Disposition: A | Discharge status: Home / Self Care | Payer: PPO | Source: Ambulatory Visit | Attending: Cardiovascular Disease | Admitting: Cardiovascular Disease

## 2016-12-19 DIAGNOSIS — I779 Disorder of arteries and arterioles, unspecified: Secondary | ICD-10-CM | POA: Insufficient documentation

## 2016-12-19 DIAGNOSIS — I739 Peripheral vascular disease, unspecified: Secondary | ICD-10-CM

## 2016-12-21 DIAGNOSIS — J449 Chronic obstructive pulmonary disease, unspecified: Secondary | ICD-10-CM | POA: Diagnosis not present

## 2016-12-21 DIAGNOSIS — F331 Major depressive disorder, recurrent, moderate: Secondary | ICD-10-CM | POA: Diagnosis not present

## 2016-12-21 DIAGNOSIS — Z6831 Body mass index (BMI) 31.0-31.9, adult: Secondary | ICD-10-CM | POA: Diagnosis not present

## 2016-12-21 DIAGNOSIS — I25111 Atherosclerotic heart disease of native coronary artery with angina pectoris with documented spasm: Secondary | ICD-10-CM | POA: Diagnosis not present

## 2016-12-21 DIAGNOSIS — I10 Essential (primary) hypertension: Secondary | ICD-10-CM | POA: Diagnosis not present

## 2016-12-21 DIAGNOSIS — E782 Mixed hyperlipidemia: Secondary | ICD-10-CM | POA: Diagnosis not present

## 2016-12-21 DIAGNOSIS — E039 Hypothyroidism, unspecified: Secondary | ICD-10-CM | POA: Diagnosis not present

## 2016-12-21 DIAGNOSIS — I8391 Asymptomatic varicose veins of right lower extremity: Secondary | ICD-10-CM | POA: Diagnosis not present

## 2016-12-28 DIAGNOSIS — J449 Chronic obstructive pulmonary disease, unspecified: Secondary | ICD-10-CM | POA: Diagnosis not present

## 2016-12-28 DIAGNOSIS — G4733 Obstructive sleep apnea (adult) (pediatric): Secondary | ICD-10-CM | POA: Diagnosis not present

## 2017-01-03 DIAGNOSIS — J449 Chronic obstructive pulmonary disease, unspecified: Secondary | ICD-10-CM | POA: Diagnosis not present

## 2017-01-03 DIAGNOSIS — G4733 Obstructive sleep apnea (adult) (pediatric): Secondary | ICD-10-CM | POA: Diagnosis not present

## 2017-01-28 DIAGNOSIS — G4733 Obstructive sleep apnea (adult) (pediatric): Secondary | ICD-10-CM | POA: Diagnosis not present

## 2017-01-28 DIAGNOSIS — J449 Chronic obstructive pulmonary disease, unspecified: Secondary | ICD-10-CM | POA: Diagnosis not present

## 2017-02-03 DIAGNOSIS — G4733 Obstructive sleep apnea (adult) (pediatric): Secondary | ICD-10-CM | POA: Diagnosis not present

## 2017-02-03 DIAGNOSIS — J449 Chronic obstructive pulmonary disease, unspecified: Secondary | ICD-10-CM | POA: Diagnosis not present

## 2017-02-07 DIAGNOSIS — G4733 Obstructive sleep apnea (adult) (pediatric): Secondary | ICD-10-CM | POA: Diagnosis not present

## 2017-02-07 DIAGNOSIS — R350 Frequency of micturition: Secondary | ICD-10-CM | POA: Diagnosis not present

## 2017-02-07 DIAGNOSIS — J449 Chronic obstructive pulmonary disease, unspecified: Secondary | ICD-10-CM | POA: Diagnosis not present

## 2017-02-07 DIAGNOSIS — M8588 Other specified disorders of bone density and structure, other site: Secondary | ICD-10-CM | POA: Diagnosis not present

## 2017-02-07 DIAGNOSIS — Z124 Encounter for screening for malignant neoplasm of cervix: Secondary | ICD-10-CM | POA: Diagnosis not present

## 2017-02-07 DIAGNOSIS — N958 Other specified menopausal and perimenopausal disorders: Secondary | ICD-10-CM | POA: Diagnosis not present

## 2017-02-07 DIAGNOSIS — R29890 Loss of height: Secondary | ICD-10-CM | POA: Diagnosis not present

## 2017-02-07 DIAGNOSIS — Z1231 Encounter for screening mammogram for malignant neoplasm of breast: Secondary | ICD-10-CM | POA: Diagnosis not present

## 2017-02-07 DIAGNOSIS — Z6833 Body mass index (BMI) 33.0-33.9, adult: Secondary | ICD-10-CM | POA: Diagnosis not present

## 2017-02-08 DIAGNOSIS — M170 Bilateral primary osteoarthritis of knee: Secondary | ICD-10-CM | POA: Diagnosis not present

## 2017-02-28 DIAGNOSIS — J449 Chronic obstructive pulmonary disease, unspecified: Secondary | ICD-10-CM | POA: Diagnosis not present

## 2017-02-28 DIAGNOSIS — G4733 Obstructive sleep apnea (adult) (pediatric): Secondary | ICD-10-CM | POA: Diagnosis not present

## 2017-03-06 DIAGNOSIS — G4733 Obstructive sleep apnea (adult) (pediatric): Secondary | ICD-10-CM | POA: Diagnosis not present

## 2017-03-06 DIAGNOSIS — J449 Chronic obstructive pulmonary disease, unspecified: Secondary | ICD-10-CM | POA: Diagnosis not present

## 2017-03-17 ENCOUNTER — Encounter: Payer: Self-pay | Admitting: Gastroenterology

## 2017-03-22 DIAGNOSIS — H5213 Myopia, bilateral: Secondary | ICD-10-CM | POA: Diagnosis not present

## 2017-03-22 DIAGNOSIS — H524 Presbyopia: Secondary | ICD-10-CM | POA: Diagnosis not present

## 2017-03-22 DIAGNOSIS — H52223 Regular astigmatism, bilateral: Secondary | ICD-10-CM | POA: Diagnosis not present

## 2017-03-30 DIAGNOSIS — G4733 Obstructive sleep apnea (adult) (pediatric): Secondary | ICD-10-CM | POA: Diagnosis not present

## 2017-03-30 DIAGNOSIS — J449 Chronic obstructive pulmonary disease, unspecified: Secondary | ICD-10-CM | POA: Diagnosis not present

## 2017-04-05 DIAGNOSIS — G4733 Obstructive sleep apnea (adult) (pediatric): Secondary | ICD-10-CM | POA: Diagnosis not present

## 2017-04-05 DIAGNOSIS — J449 Chronic obstructive pulmonary disease, unspecified: Secondary | ICD-10-CM | POA: Diagnosis not present

## 2017-04-06 ENCOUNTER — Encounter: Payer: Self-pay | Admitting: Cardiovascular Disease

## 2017-04-06 ENCOUNTER — Ambulatory Visit (INDEPENDENT_AMBULATORY_CARE_PROVIDER_SITE_OTHER): Payer: PPO | Admitting: Cardiovascular Disease

## 2017-04-06 ENCOUNTER — Encounter (INDEPENDENT_AMBULATORY_CARE_PROVIDER_SITE_OTHER): Payer: Self-pay

## 2017-04-06 VITALS — BP 116/62 | HR 62 | Ht 68.0 in | Wt 211.4 lb

## 2017-04-06 DIAGNOSIS — I6529 Occlusion and stenosis of unspecified carotid artery: Secondary | ICD-10-CM | POA: Diagnosis not present

## 2017-04-06 DIAGNOSIS — E7800 Pure hypercholesterolemia, unspecified: Secondary | ICD-10-CM

## 2017-04-06 DIAGNOSIS — I2510 Atherosclerotic heart disease of native coronary artery without angina pectoris: Secondary | ICD-10-CM

## 2017-04-06 DIAGNOSIS — R0602 Shortness of breath: Secondary | ICD-10-CM

## 2017-04-06 LAB — CBC WITH DIFFERENTIAL/PLATELET
BASOS: 1 %
Basophils Absolute: 0 10*3/uL (ref 0.0–0.2)
EOS (ABSOLUTE): 0.2 10*3/uL (ref 0.0–0.4)
Eos: 3 %
Hematocrit: 37 % (ref 34.0–46.6)
Hemoglobin: 12.3 g/dL (ref 11.1–15.9)
Immature Grans (Abs): 0 10*3/uL (ref 0.0–0.1)
Immature Granulocytes: 0 %
Lymphocytes Absolute: 1.6 10*3/uL (ref 0.7–3.1)
Lymphs: 27 %
MCH: 30.1 pg (ref 26.6–33.0)
MCHC: 33.2 g/dL (ref 31.5–35.7)
MCV: 91 fL (ref 79–97)
MONOS ABS: 0.9 10*3/uL (ref 0.1–0.9)
Monocytes: 15 %
NEUTROS ABS: 3.4 10*3/uL (ref 1.4–7.0)
Neutrophils: 54 %
PLATELETS: 190 10*3/uL (ref 150–379)
RBC: 4.08 x10E6/uL (ref 3.77–5.28)
RDW: 15.6 % — AB (ref 12.3–15.4)
WBC: 6.2 10*3/uL (ref 3.4–10.8)

## 2017-04-06 LAB — BASIC METABOLIC PANEL
BUN/Creatinine Ratio: 14 (ref 12–28)
BUN: 13 mg/dL (ref 8–27)
CALCIUM: 9.4 mg/dL (ref 8.7–10.3)
CHLORIDE: 102 mmol/L (ref 96–106)
CO2: 27 mmol/L (ref 20–29)
Creatinine, Ser: 0.92 mg/dL (ref 0.57–1.00)
GFR, EST AFRICAN AMERICAN: 69 mL/min/{1.73_m2} (ref >59)
GFR, EST NON AFRICAN AMERICAN: 60 mL/min/{1.73_m2} (ref >59)
Glucose: 117 mg/dL — ABNORMAL HIGH (ref 65–99)
POTASSIUM: 3.6 mmol/L (ref 3.5–5.2)
SODIUM: 143 mmol/L (ref 134–144)

## 2017-04-06 LAB — PROTIME-INR
INR: 1 (ref 0.8–1.2)
Prothrombin Time: 10.7 s (ref 9.1–12.0)

## 2017-04-06 LAB — APTT: aPTT: 28 s (ref 24–33)

## 2017-04-06 NOTE — Progress Notes (Signed)
Cardiology Office Note Date:  04/06/2017   ID:  Stephanie, Alvarado Stephanie Alvarado, MRN [id number]  PCP:  Stephanie Bis, MD  Cardiologist:  Stephanie Mocha, MD    Chief Complaint  Patient presents with  . Follow-up     History of Present Illness: Stephanie Alvarado is a 76 y.o. female who presents for follow-up with shortness of breath and coronary artery disease. She was last seen in September 2017. The patient has a long history of coronary artery disease and she initially underwent PCI of the right coronary artery in 2006 with a Zomax study stent. In 2009 she was noted to have progressive stenosis in the proximal LAD and she underwent stenting with a Promus DES. Her last nuclear stress test was in 2017 which demonstrated no significant ischemia. An echocardiogram at that same time showed normal LV systolic function and mild diastolic dysfunction.  She is here alone today. Reports marked progression of her chronic shortness of breath over the past several months. Her diuretics have been increased by Stephanie Alvarado but she hasn't appreciated much improvement in symptoms. She is short of breath now with minimal activity. She's had some swelling in her abdomen but she is not sure if this is related to weight gain or fluid. She denies any leg swelling. She does not have orthopnea or PND, but does describe a funny feeling in her chest when she first wakes up in the morning. This occurs frequently and tends to resolve on its own. She has not taken nitroglycerin recently. She denies chest pain.  Past Medical History:  Diagnosis Date  . Allergic rhinitis, cause unspecified   . Anemia, unspecified   . Anxiety state, unspecified   . Atherosclerosis of aorta ([place])    TEE, June, 2010, grade 4 aortic arch atherosclerosis , Stephanie. Aundra Dubin  . CAD (coronary artery disease)    DES and circumflex 2006 /  DES to LAD 2011  . Carotid artery disease ([place])    Doppler, November, 2011, stable, 0-39% bilateral, turbulent flow  left subclavian  . Chronic diastolic heart failure ([place])   . Cyst of thyroid   . Diverticulosis of colon (without mention of hemorrhage)   . Dysphasia    Possibly esophageal stricture  . Ejection fraction     EF 60%, Echo, November 12, 2008, /  EF 55%, TEE, November 17, 2008  . Esophageal reflux   . Hiatal hernia   . Hyperlipemia   . Hypertension   . Lumbago   . Lumbar disc disease   . MVP (mitral valve prolapse)   . Nonspecific abnormal finding in stool contents   . Obesity, unspecified   . Osteoarthrosis, unspecified whether generalized or localized, unspecified site   . Osteopenia   . Other diseases of lung, not elsewhere classified   . Palpitations    October, 2012  . Peripheral vascular disease, unspecified ([place])   . Personal history of colonic polyps    ADENOMATOUS POLYP  . PFO (patent foramen ovale)    Patient had a very small PFO by TEE 2010.  This was seen only by bubble analysis during Valsalva  . Shortness of breath   . Sleep apnea   . Sleep related hypoventilation/hypoxemia in conditions classifiable elsewhere   . Stroke [facility]) 11/2008   Treated with TPA? /     2010, TEE no left atrial clot, probably from atherosclerosis of the aortic arch  . 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  Stephanie Kellie Alvarado.  Marland Kitchen Unspecified cerebral artery occlusion with cerebral infarction   . Unspecified venous (peripheral) insufficiency     Past Surgical History:  Procedure Laterality Date  . BACK SURGERY    . cataracts     both eyes  . CHOLECYSTECTOMY    . CORONARY ANGIOPLASTY WITH STENT PLACEMENT  1990's  . LAPAROSCOPIC HYSTERECTOMY    . LUMBAR DISC SURGERY     X 3  . urethral suspension  1993   Stephanie. Nori Alvarado  . VARICOSE VEIN SURGERY     x 2    Current Outpatient Prescriptions  Medication Sig Dispense Refill  . amLODipine (NORVASC) 5 MG tablet Take 5 mg by mouth daily.      Marland Kitchen buPROPion (WELLBUTRIN XL) 150 MG 24 hr tablet Take 150 mg by mouth 2 (two) times daily.     .  clopidogrel (PLAVIX) 75 MG tablet Take 1 tablet (75 mg total) by mouth daily. 90 tablet 3  . furosemide (LASIX) 40 MG tablet Take 80 mg by mouth daily.     Marland Kitchen gabapentin (NEURONTIN) 300 MG capsule Take 300 mg by mouth 3 (three) times daily.    . isosorbide mononitrate (IMDUR) 30 MG 24 hr tablet Take 30 mg by mouth daily.    . metoprolol tartrate (LOPRESSOR) 25 MG tablet Take 1 tablet (25 mg total) by mouth 2 (two) times daily. 30 tablet 0  . nitroGLYCERIN (NITROSTAT) 0.4 MG SL tablet Place 0.4 mg under the tongue every 5 (five) minutes as needed for chest pain (TAKE UP TO 3 DOSES BEFORE CALLING 911).    . pantoprazole (PROTONIX) 40 MG tablet Take 40 mg by mouth 2 (two) times daily.     . polyethylene glycol powder (GLYCOLAX/MIRALAX) powder Take 17 g by mouth daily as needed for moderate constipation.      No current facility-administered medications for this visit.     Allergies:   Codeine and Lipitor [atorvastatin]   Social History:  The patient  reports that she quit smoking about 18 years ago. Her smoking use included Cigarettes. She has a 40.00 pack-year smoking history. She has never used smokeless tobacco. She reports that she does not drink alcohol or use drugs.   Family History:  The patient's family history includes Alzheimer's disease in her mother; Breast cancer in her unknown relative; Colon cancer in her paternal aunt; Heart attack in her father; Heart disease in her father.    ROS:  Please see the history of present illness.  All other systems are reviewed and negative.    PHYSICAL EXAM: VS:  BP 116/62   Pulse 62   Ht 5\' 8"  (1.727 m)   Wt 211 lb 6.4 oz (95.9 kg)   BMI 32.14 kg/m  , BMI Body mass index is 32.14 kg/m. GEN: Well nourished, well developed, pleasant elderly woman in no acute distress  HEENT: normal  Neck: no JVD, no masses. No carotid bruits Cardiac: RRR without murmur or gallop                Respiratory:  clear to auscultation bilaterally, normal work of  breathing GI: soft, nontender, nondistended, + BS MS: no deformity or atrophy  Ext: no pretibial edema, pedal pulses 2+= bilaterally Skin: warm and dry, no rash Neuro:  Strength and sensation are intact Psych: euthymic mood, full affect  EKG:  EKG is ordered today. The ekg ordered today shows normal sinus rhythm 62 bpm  Baseline artifact, nonspecific ST abnormality  Recent Labs:  10/27/2016: ALT 14; B Natriuretic Peptide 100.0; BUN 15; Creatinine, Ser 0.93; Hemoglobin 13.9; Platelets 204; Potassium 3.9; Sodium 143; TSH 1.368   Lipid Panel     Component Value Date/Time   CHOL 160 05/31/2016 0641   TRIG 144 05/31/2016 0641   TRIG 135 03/28/2006 0936   HDL 73 05/31/2016 0641   CHOLHDL 2.2 05/31/2016 0641   VLDL 29 05/31/2016 0641   LDLCALC 58 05/31/2016 0641   LDLDIRECT 123.3 04/29/2010 0000      Wt Readings from Last 3 Encounters:  04/06/17 211 lb 6.4 oz (95.9 kg)  05/30/16 198 lb 11.2 oz (90.1 kg)  02/29/16 199 lb 12.8 oz (90.6 kg)     Cardiac Studies Reviewed: 2-D echocardiogram 12/01/2015: Study Conclusions  - Left ventricle: The cavity size was normal. Wall thickness was   increased in a pattern of mild LVH. Systolic function was normal.   The estimated ejection fraction was in the range of 55% to 60%.   Basal inferior hypokinesis. Doppler parameters are consistent   with abnormal left ventricular relaxation (grade 1 diastolic   dysfunction). - Aortic valve: There was no stenosis. - Mitral valve: Mildly calcified annulus. There was trivial   regurgitation. - Left atrium: The atrium was mildly dilated. - Right ventricle: The cavity size was normal. Systolic function   was normal. - Right atrium: The atrium was mildly dilated. - Atrial septum: Possible PFO but not fully visualized. - Tricuspid valve: Peak RV-RA gradient (S): 27 mm Hg. - Pulmonary arteries: PA peak pressure: 30 mm Hg (S). - Inferior vena cava: The vessel was normal in size. The   respirophasic  diameter changes were in the normal range (= 50%),   consistent with normal central venous pressure.  Impressions:  - Normal LV size with mild LV hypertrophy. EF 55-60%. Basal   inferior hypokinesis. Normal RV size and systolic function. No   significant valvular abnormalities. Possible PFO present but not   fully visualized.  Myocardial perfusion scan 12/01/2015: Study Highlights    Nuclear stress EF: 54%.  The left ventricular ejection fraction is mildly decreased (45-54%).  There was no ST segment deviation noted during stress.  No T wave inversion was noted during stress.  Defect 1: There is a small defect of moderate severity present in the apex location. Prior infarct vs. apical thinning. No ischemia.  The study is normal.   ASSESSMENT AND PLAN: 1.  Coronary artery disease, native vessel: No clear anginal symptoms, but she does have progressive shortness of breath with a functional class III limitation. She has not always had chest pain with her ischemic symptoms in the past. Considering her known CAD, history of two-vessel stenting, and progressive shortness of breath, I have recommended right and left heart catheterization with possible PCI if indicated. We discussed noninvasive testing as an option, but we both favored moving forward with a more definitive evaluation as she has become increasingly short of breath.  I have reviewed the risks, indications, and alternatives to cardiac catheterization, possible angioplasty, and stenting with the patient. Risks include but are not limited to bleeding, infection, vascular injury, stroke, myocardial infection, arrhythmia, kidney injury, radiation-related injury in the case of prolonged fluoroscopy use, emergency cardiac surgery, and death. The patient understands the risks of serious complication is 1-2 in 1517 with diagnostic cardiac cath and 1-2% or less with angioplasty/stenting.   2. Shortness of breath, progressive: See  discussion above. Right and left heart cath to assess filling pressures, hemodynamics, and coronary  anatomy. I reviewed outside labs and she did have a BNP that was just at the upper limit of normal. I do not appreciate JVD or significant edema on her exam today.  3. Hypertension: Continue amlodipine, isosorbide, and metoprolol. Blood pressure is well controlled today.  4. Hyperlipidemia: Patient is statin intolerant.  Current medicines are reviewed with the patient today.  The patient does not have concerns regarding medicines.  Labs/ tests ordered today include:   Orders Placed This Encounter  Procedures  . Basic metabolic panel  . CBC with Differential/Platelet  . INR/PT  . PTT    Disposition:   FU pending cath results.   Stephanie James, MD  04/06/2017 1:38 PM    [facility] [place], [place], [place]  [address] Phone: [phone]; Fax: [phone]

## 2017-04-06 NOTE — Patient Instructions (Signed)
Medication Instructions:  Your provider recommends that you continue on your current medications as directed. Please refer to the Current Medication list given to you today.    Labwork: TODAY: BMET, CBC, PT, INR  Testing/Procedures: Your physician has requested that you have a cardiac catheterization. Cardiac catheterization is used to diagnose and/or treat various heart conditions. Doctors may recommend this procedure for a number of different reasons. The most common reason is to evaluate chest pain. Chest pain can be a symptom of coronary artery disease (CAD), and cardiac catheterization can show whether plaque is narrowing or blocking your heart's arteries. This procedure is also used to evaluate the valves, as well as measure the blood flow and oxygen levels in different parts of your heart. For further information please visit [email or web address]. Please follow instruction sheet, as given.  Follow-Up: Your provider wants you to follow-up in: 1 year with Dr. Burt Knack. You will receive a reminder letter in the mail two months in advance. If you don't receive a letter, please call our office to schedule the follow-up appointment.    Any Other Special Instructions Will Be Listed Below (If Applicable).     If you need a refill on your cardiac medications before your next appointment, please call your pharmacy.

## 2017-04-07 ENCOUNTER — Other Ambulatory Visit: Payer: Self-pay

## 2017-04-07 DIAGNOSIS — I10 Essential (primary) hypertension: Secondary | ICD-10-CM

## 2017-04-10 ENCOUNTER — Ambulatory Visit (HOSPITAL_COMMUNITY)
Admission: RE | Admit: 2017-04-10 | Discharge: 2017-04-10 | Disposition: A | Discharge status: Home / Self Care | Payer: PPO | Source: Ambulatory Visit | Attending: Interventional Cardiology | Admitting: Interventional Cardiology

## 2017-04-10 ENCOUNTER — Encounter (HOSPITAL_COMMUNITY)
Admission: RE | Disposition: A | Discharge status: Home / Self Care | Payer: Self-pay | Source: Ambulatory Visit | Attending: Interventional Cardiology

## 2017-04-10 DIAGNOSIS — I2510 Atherosclerotic heart disease of native coronary artery without angina pectoris: Secondary | ICD-10-CM | POA: Diagnosis not present

## 2017-04-10 DIAGNOSIS — Z8673 Personal history of transient ischemic attack (TIA), and cerebral infarction without residual deficits: Secondary | ICD-10-CM | POA: Insufficient documentation

## 2017-04-10 DIAGNOSIS — G4730 Sleep apnea, unspecified: Secondary | ICD-10-CM | POA: Insufficient documentation

## 2017-04-10 DIAGNOSIS — I341 Nonrheumatic mitral (valve) prolapse: Secondary | ICD-10-CM | POA: Insufficient documentation

## 2017-04-10 DIAGNOSIS — Z7902 Long term (current) use of antithrombotics/antiplatelets: Secondary | ICD-10-CM | POA: Insufficient documentation

## 2017-04-10 DIAGNOSIS — M1990 Unspecified osteoarthritis, unspecified site: Secondary | ICD-10-CM | POA: Diagnosis not present

## 2017-04-10 DIAGNOSIS — R0600 Dyspnea, unspecified: Secondary | ICD-10-CM | POA: Diagnosis present

## 2017-04-10 DIAGNOSIS — E669 Obesity, unspecified: Secondary | ICD-10-CM | POA: Insufficient documentation

## 2017-04-10 DIAGNOSIS — I5032 Chronic diastolic (congestive) heart failure: Secondary | ICD-10-CM | POA: Insufficient documentation

## 2017-04-10 DIAGNOSIS — Z8249 Family history of ischemic heart disease and other diseases of the circulatory system: Secondary | ICD-10-CM | POA: Diagnosis not present

## 2017-04-10 DIAGNOSIS — K219 Gastro-esophageal reflux disease without esophagitis: Secondary | ICD-10-CM | POA: Insufficient documentation

## 2017-04-10 DIAGNOSIS — M8580 Other specified disorders of bone density and structure, unspecified site: Secondary | ICD-10-CM | POA: Insufficient documentation

## 2017-04-10 DIAGNOSIS — I739 Peripheral vascular disease, unspecified: Secondary | ICD-10-CM | POA: Insufficient documentation

## 2017-04-10 DIAGNOSIS — F419 Anxiety disorder, unspecified: Secondary | ICD-10-CM | POA: Insufficient documentation

## 2017-04-10 DIAGNOSIS — I110 Hypertensive heart disease with heart failure: Secondary | ICD-10-CM | POA: Insufficient documentation

## 2017-04-10 DIAGNOSIS — Y831 Surgical operation with implant of artificial internal device as the cause of abnormal reaction of the patient, or of later complication, without mention of misadventure at the time of the procedure: Secondary | ICD-10-CM | POA: Diagnosis not present

## 2017-04-10 DIAGNOSIS — I700 Atherosclerosis of aorta: Secondary | ICD-10-CM | POA: Diagnosis present

## 2017-04-10 DIAGNOSIS — Z87891 Personal history of nicotine dependence: Secondary | ICD-10-CM | POA: Insufficient documentation

## 2017-04-10 DIAGNOSIS — T82855A Stenosis of coronary artery stent, initial encounter: Secondary | ICD-10-CM | POA: Insufficient documentation

## 2017-04-10 DIAGNOSIS — Z6832 Body mass index (BMI) 32.0-32.9, adult: Secondary | ICD-10-CM | POA: Diagnosis not present

## 2017-04-10 DIAGNOSIS — E785 Hyperlipidemia, unspecified: Secondary | ICD-10-CM | POA: Diagnosis not present

## 2017-04-10 HISTORY — PX: RIGHT/LEFT HEART CATH AND CORONARY ANGIOGRAPHY: CATH118266

## 2017-04-10 LAB — POCT I-STAT 3, ART BLOOD GAS (G3+)
ACID-BASE EXCESS: 3 mmol/L — AB (ref 0.0–2.0)
BICARBONATE: 28.5 mmol/L — AB (ref 20.0–28.0)
O2 Saturation: 97 %
TCO2: 30 mmol/L (ref 22–32)
pCO2 arterial: 49.1 mmHg — ABNORMAL HIGH (ref 32.0–48.0)
pH, Arterial: 7.372 (ref 7.350–7.450)
pO2, Arterial: 96 mmHg (ref 83.0–108.0)

## 2017-04-10 LAB — POCT I-STAT 3, VENOUS BLOOD GAS (G3P V)
ACID-BASE EXCESS: 2 mmol/L (ref 0.0–2.0)
Acid-Base Excess: 3 mmol/L — ABNORMAL HIGH (ref 0.0–2.0)
Acid-Base Excess: 3 mmol/L — ABNORMAL HIGH (ref 0.0–2.0)
Acid-Base Excess: 5 mmol/L — ABNORMAL HIGH (ref 0.0–2.0)
BICARBONATE: 29.4 mmol/L — AB (ref 20.0–28.0)
BICARBONATE: 31.1 mmol/L — AB (ref 20.0–28.0)
Bicarbonate: 29.6 mmol/L — ABNORMAL HIGH (ref 20.0–28.0)
Bicarbonate: 30 mmol/L — ABNORMAL HIGH (ref 20.0–28.0)
O2 SAT: 71 %
O2 Saturation: 71 %
O2 Saturation: 72 %
O2 Saturation: 75 %
PCO2 VEN: 55 mmHg (ref 44.0–60.0)
PCO2 VEN: 55.9 mmHg (ref 44.0–60.0)
PCO2 VEN: 57.3 mmHg (ref 44.0–60.0)
PH VEN: 7.332 (ref 7.250–7.430)
PH VEN: 7.361 (ref 7.250–7.430)
PO2 VEN: 40 mmHg (ref 32.0–45.0)
PO2 VEN: 41 mmHg (ref 32.0–45.0)
PO2 VEN: 44 mmHg (ref 32.0–45.0)
TCO2: 31 mmol/L (ref 22–32)
TCO2: 32 mmol/L (ref 22–32)
TCO2: 33 mmol/L — AB (ref 22–32)
TCO2: 31 mmol/L (ref 22–32)
pCO2, Ven: 56 mmHg (ref 44.0–60.0)
pH, Ven: 7.326 (ref 7.250–7.430)
pH, Ven: 7.328 (ref 7.250–7.430)
pO2, Ven: 41 mmHg (ref 32.0–45.0)

## 2017-04-10 SURGERY — RIGHT/LEFT HEART CATH AND CORONARY ANGIOGRAPHY
Anesthesia: LOCAL

## 2017-04-10 MED ORDER — HEPARIN (PORCINE) IN NACL 2-0.9 UNIT/ML-% IJ SOLN
INTRAMUSCULAR | Status: AC
  Filled 2017-04-10: qty 1000

## 2017-04-10 MED ORDER — HEPARIN SODIUM (PORCINE) 1000 UNIT/ML IJ SOLN
INTRAMUSCULAR | Status: DC | PRN
  Administered 2017-04-10: 5000 [IU] via INTRAVENOUS

## 2017-04-10 MED ORDER — SODIUM CHLORIDE 0.9% FLUSH: 3.0000 mL | INTRAVENOUS | Status: DC | PRN

## 2017-04-10 MED ORDER — NITROGLYCERIN 1 MG/10 ML FOR IR/CATH LAB
INTRA_ARTERIAL | Status: AC
  Filled 2017-04-10: qty 10

## 2017-04-10 MED ORDER — SODIUM CHLORIDE 0.9 % IV SOLN: INTRAVENOUS | Status: DC

## 2017-04-10 MED ORDER — SODIUM CHLORIDE 0.9 % IV SOLN: 250.0000 mL | INTRAVENOUS | Status: DC | PRN

## 2017-04-10 MED ORDER — LIDOCAINE HCL 2 % IJ SOLN
INTRAMUSCULAR | Status: AC
  Filled 2017-04-10: qty 20

## 2017-04-10 MED ORDER — VERAPAMIL HCL 2.5 MG/ML IV SOLN
INTRAVENOUS | Status: DC | PRN
  Administered 2017-04-10: 10 mL via INTRA_ARTERIAL

## 2017-04-10 MED ORDER — SODIUM CHLORIDE 0.9% FLUSH: 3.0000 mL | Freq: Two times a day (BID) | INTRAVENOUS | Status: DC

## 2017-04-10 MED ORDER — SODIUM CHLORIDE 0.9 % WEIGHT BASED INFUSION
3.0000 mL/kg/h | INTRAVENOUS | Status: AC
  Administered 2017-04-10: 3 mL/kg/h via INTRAVENOUS

## 2017-04-10 MED ORDER — SODIUM CHLORIDE 0.9 % WEIGHT BASED INFUSION: 1.0000 mL/kg/h | INTRAVENOUS | Status: DC

## 2017-04-10 MED ORDER — VERAPAMIL HCL 2.5 MG/ML IV SOLN
INTRAVENOUS | Status: AC
  Filled 2017-04-10: qty 2

## 2017-04-10 MED ORDER — HEPARIN SODIUM (PORCINE) 1000 UNIT/ML IJ SOLN
INTRAMUSCULAR | Status: AC
  Filled 2017-04-10: qty 1

## 2017-04-10 MED ORDER — IOPAMIDOL (ISOVUE-370) INJECTION 76%
INTRAVENOUS | Status: DC | PRN
  Administered 2017-04-10: 100 mL via INTRA_ARTERIAL

## 2017-04-10 MED ORDER — HEPARIN (PORCINE) IN NACL 2-0.9 UNIT/ML-% IJ SOLN
INTRAMUSCULAR | Status: AC | PRN
  Administered 2017-04-10: 1000 mL

## 2017-04-10 MED ORDER — IOPAMIDOL (ISOVUE-370) INJECTION 76%
INTRAVENOUS | Status: AC
  Filled 2017-04-10: qty 50

## 2017-04-10 MED ORDER — ONDANSETRON HCL 4 MG/2ML IJ SOLN: 4.0000 mg | Freq: Four times a day (QID) | INTRAMUSCULAR | Status: DC | PRN

## 2017-04-10 MED ORDER — MIDAZOLAM HCL 2 MG/2ML IJ SOLN
INTRAMUSCULAR | Status: DC | PRN
  Administered 2017-04-10 (×2): 1 mg via INTRAVENOUS

## 2017-04-10 MED ORDER — LIDOCAINE HCL 2 % IJ SOLN
INTRAMUSCULAR | Status: DC | PRN
  Administered 2017-04-10: 1 mL
  Administered 2017-04-10: 3 mL

## 2017-04-10 MED ORDER — FENTANYL CITRATE (PF) 100 MCG/2ML IJ SOLN
INTRAMUSCULAR | Status: AC
  Filled 2017-04-10: qty 2

## 2017-04-10 MED ORDER — FENTANYL CITRATE (PF) 100 MCG/2ML IJ SOLN
INTRAMUSCULAR | Status: DC | PRN
  Administered 2017-04-10: 50 ug via INTRAVENOUS

## 2017-04-10 MED ORDER — ASPIRIN 81 MG PO CHEW: 81.0000 mg | CHEWABLE_TABLET | ORAL | Status: DC

## 2017-04-10 MED ORDER — NITROGLYCERIN 1 MG/10 ML FOR IR/CATH LAB
INTRA_ARTERIAL | Status: DC | PRN
  Administered 2017-04-10: 200 ug via INTRACORONARY

## 2017-04-10 MED ORDER — IOPAMIDOL (ISOVUE-370) INJECTION 76%
INTRAVENOUS | Status: AC
  Filled 2017-04-10: qty 100

## 2017-04-10 MED ORDER — MIDAZOLAM HCL 2 MG/2ML IJ SOLN
INTRAMUSCULAR | Status: AC
  Filled 2017-04-10: qty 2

## 2017-04-10 MED ORDER — CLOPIDOGREL BISULFATE 75 MG PO TABS: 75.0000 mg | ORAL_TABLET | ORAL | Status: DC

## 2017-04-10 MED ORDER — ACETAMINOPHEN 325 MG PO TABS: 650.0000 mg | ORAL_TABLET | ORAL | Status: DC | PRN

## 2017-04-10 SURGICAL SUPPLY — 15 items
CATH BALLN WEDGE 5F 110CM (CATHETERS) ×1 IMPLANT
CATH INFINITI 5 FR JL3.5 (CATHETERS) ×1 IMPLANT
CATH INFINITI JR4 5F (CATHETERS) ×1 IMPLANT
COVER PRB 48X5XTLSCP FOLD TPE (BAG) IMPLANT
COVER PROBE 5X48 (BAG) ×2
DEVICE RAD COMP TR BAND LRG (VASCULAR PRODUCTS) ×1 IMPLANT
GLIDESHEATH SLEND A-KIT 6F 22G (SHEATH) ×1 IMPLANT
GUIDEWIRE INQWIRE 1.5J.035X260 (WIRE) IMPLANT
INQWIRE 1.5J .035X260CM (WIRE) ×2
KIT HEART LEFT (KITS) ×2 IMPLANT
PACK CARDIAC CATHETERIZATION (CUSTOM PROCEDURE TRAY) ×2 IMPLANT
SHEATH GLIDE SLENDER 4/5FR (SHEATH) ×1 IMPLANT
TRANSDUCER W/STOPCOCK (MISCELLANEOUS) ×2 IMPLANT
TUBING CIL FLEX 10 FLL-RA (TUBING) ×2 IMPLANT
WIRE ASAHI PROWATER 300CM (WIRE) ×1 IMPLANT

## 2017-04-10 NOTE — Progress Notes (Signed)
Reviewed - thanks. Medical therapy, risk reduction measures seem appropriate.

## 2017-04-10 NOTE — Discharge Instructions (Signed)

## 2017-04-10 NOTE — Interval H&P Note (Signed)
Cath Lab Visit (complete for each Cath Lab visit)  Clinical Evaluation Leading to the Procedure:   ACS: No.  Non-ACS:    Anginal Classification: CCS III  Anti-ischemic medical therapy: Maximal Therapy (2 or more classes of medications)  Non-Invasive Test Results: No non-invasive testing performed  Prior CABG: No previous CABG      History and Physical Interval Note:  04/10/2017 12:48 PM  Bonnielee Haff  has presented today for surgery, with the diagnosis of sob  The various methods of treatment have been discussed with the patient and family. After consideration of risks, benefits and other options for treatment, the patient has consented to  Procedure(s): RIGHT/LEFT HEART CATH AND CORONARY ANGIOGRAPHY (N/A) as a surgical intervention .  The patient's history has been reviewed, patient examined, no change in status, stable for surgery.  I have reviewed the patient's chart and labs.  Questions were answered to the patient's satisfaction.     Belva Crome III

## 2017-04-10 NOTE — H&P (View-Only) (Signed)
Cardiology Office Note Date:  04/06/2017   ID:  Terrence, Pizana 1940/06/29, MRN [id number]  PCP:  Caryl Bis, MD  Cardiologist:  Sherren Mocha, MD    Chief Complaint  Patient presents with  . Follow-up     History of Present Illness: Stephanie Alvarado is a 76 y.o. female who presents for follow-up with shortness of breath and coronary artery disease. She was last seen in September 2017. The patient has a long history of coronary artery disease and she initially underwent PCI of the right coronary artery in 2006 with a Zomax study stent. In 2009 she was noted to have progressive stenosis in the proximal LAD and she underwent stenting with a Promus DES. Her last nuclear stress test was in 2017 which demonstrated no significant ischemia. An echocardiogram at that same time showed normal LV systolic function and mild diastolic dysfunction.  She is here alone today. Reports marked progression of her chronic shortness of breath over the past several months. Her diuretics have been increased by Dr Quillian Quince but she hasn't appreciated much improvement in symptoms. She is short of breath now with minimal activity. She's had some swelling in her abdomen but she is not sure if this is related to weight gain or fluid. She denies any leg swelling. She does not have orthopnea or PND, but does describe a funny feeling in her chest when she first wakes up in the morning. This occurs frequently and tends to resolve on its own. She has not taken nitroglycerin recently. She denies chest pain.  Past Medical History:  Diagnosis Date  . Allergic rhinitis, cause unspecified   . Anemia, unspecified   . Anxiety state, unspecified   . Atherosclerosis of aorta ([place])    TEE, June, 2010, grade 4 aortic arch atherosclerosis , Dr. Aundra Dubin  . CAD (coronary artery disease)    DES and circumflex 2006 /  DES to LAD 2011  . Carotid artery disease ([place])    Doppler, November, 2011, stable, 0-39% bilateral, turbulent flow  left subclavian  . Chronic diastolic heart failure ([place])   . Cyst of thyroid   . Diverticulosis of colon (without mention of hemorrhage)   . Dysphasia    Possibly esophageal stricture  . Ejection fraction     EF 60%, Echo, November 12, 2008, /  EF 55%, TEE, November 17, 2008  . Esophageal reflux   . Hiatal hernia   . Hyperlipemia   . Hypertension   . Lumbago   . Lumbar disc disease   . MVP (mitral valve prolapse)   . Nonspecific abnormal finding in stool contents   . Obesity, unspecified   . Osteoarthrosis, unspecified whether generalized or localized, unspecified site   . Osteopenia   . Other diseases of lung, not elsewhere classified   . Palpitations    October, 2012  . Peripheral vascular disease, unspecified ([place])   . Personal history of colonic polyps    ADENOMATOUS POLYP  . PFO (patent foramen ovale)    Patient had a very small PFO by TEE 2010.  This was seen only by bubble analysis during Valsalva  . Shortness of breath   . Sleep apnea   . Sleep related hypoventilation/hypoxemia in conditions classifiable elsewhere   . Stroke [facility]) 11/2008   Treated with TPA? /     2010, TEE no left atrial clot, probably from atherosclerosis of the aortic arch  . 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  JD Kellie Simmering.  Marland Kitchen Unspecified cerebral artery occlusion with cerebral infarction   . Unspecified venous (peripheral) insufficiency     Past Surgical History:  Procedure Laterality Date  . BACK SURGERY    . cataracts     both eyes  . CHOLECYSTECTOMY    . CORONARY ANGIOPLASTY WITH STENT PLACEMENT  1990's  . LAPAROSCOPIC HYSTERECTOMY    . LUMBAR DISC SURGERY     X 3  . urethral suspension  1993   Dr. Nori Riis  . VARICOSE VEIN SURGERY     x 2    Current Outpatient Prescriptions  Medication Sig Dispense Refill  . amLODipine (NORVASC) 5 MG tablet Take 5 mg by mouth daily.      Marland Kitchen buPROPion (WELLBUTRIN XL) 150 MG 24 hr tablet Take 150 mg by mouth 2 (two) times daily.     .  clopidogrel (PLAVIX) 75 MG tablet Take 1 tablet (75 mg total) by mouth daily. 90 tablet 3  . furosemide (LASIX) 40 MG tablet Take 80 mg by mouth daily.     Marland Kitchen gabapentin (NEURONTIN) 300 MG capsule Take 300 mg by mouth 3 (three) times daily.    . isosorbide mononitrate (IMDUR) 30 MG 24 hr tablet Take 30 mg by mouth daily.    . metoprolol tartrate (LOPRESSOR) 25 MG tablet Take 1 tablet (25 mg total) by mouth 2 (two) times daily. 30 tablet 0  . nitroGLYCERIN (NITROSTAT) 0.4 MG SL tablet Place 0.4 mg under the tongue every 5 (five) minutes as needed for chest pain (TAKE UP TO 3 DOSES BEFORE CALLING 911).    . pantoprazole (PROTONIX) 40 MG tablet Take 40 mg by mouth 2 (two) times daily.     . polyethylene glycol powder (GLYCOLAX/MIRALAX) powder Take 17 g by mouth daily as needed for moderate constipation.      No current facility-administered medications for this visit.     Allergies:   Codeine and Lipitor [atorvastatin]   Social History:  The patient  reports that she quit smoking about 18 years ago. Her smoking use included Cigarettes. She has a 40.00 pack-year smoking history. She has never used smokeless tobacco. She reports that she does not drink alcohol or use drugs.   Family History:  The patient's family history includes Alzheimer's disease in her mother; Breast cancer in her unknown relative; Colon cancer in her paternal aunt; Heart attack in her father; Heart disease in her father.    ROS:  Please see the history of present illness.  All other systems are reviewed and negative.    PHYSICAL EXAM: VS:  BP 116/62   Pulse 62   Ht 5\' 8"  (1.727 m)   Wt 211 lb 6.4 oz (95.9 kg)   BMI 32.14 kg/m  , BMI Body mass index is 32.14 kg/m. GEN: Well nourished, well developed, pleasant elderly woman in no acute distress  HEENT: normal  Neck: no JVD, no masses. No carotid bruits Cardiac: RRR without murmur or gallop                Respiratory:  clear to auscultation bilaterally, normal work of  breathing GI: soft, nontender, nondistended, + BS MS: no deformity or atrophy  Ext: no pretibial edema, pedal pulses 2+= bilaterally Skin: warm and dry, no rash Neuro:  Strength and sensation are intact Psych: euthymic mood, full affect  EKG:  EKG is ordered today. The ekg ordered today shows normal sinus rhythm 62 bpm  Baseline artifact, nonspecific ST abnormality  Recent Labs:  10/27/2016: ALT 14; B Natriuretic Peptide 100.0; BUN 15; Creatinine, Ser 0.93; Hemoglobin 13.9; Platelets 204; Potassium 3.9; Sodium 143; TSH 1.368   Lipid Panel     Component Value Date/Time   CHOL 160 05/31/2016 0641   TRIG 144 05/31/2016 0641   TRIG 135 03/28/2006 0936   HDL 73 05/31/2016 0641   CHOLHDL 2.2 05/31/2016 0641   VLDL 29 05/31/2016 0641   LDLCALC 58 05/31/2016 0641   LDLDIRECT 123.3 04/29/2010 0000      Wt Readings from Last 3 Encounters:  04/06/17 211 lb 6.4 oz (95.9 kg)  05/30/16 198 lb 11.2 oz (90.1 kg)  02/29/16 199 lb 12.8 oz (90.6 kg)     Cardiac Studies Reviewed: 2-D echocardiogram 12/01/2015: Study Conclusions  - Left ventricle: The cavity size was normal. Wall thickness was   increased in a pattern of mild LVH. Systolic function was normal.   The estimated ejection fraction was in the range of 55% to 60%.   Basal inferior hypokinesis. Doppler parameters are consistent   with abnormal left ventricular relaxation (grade 1 diastolic   dysfunction). - Aortic valve: There was no stenosis. - Mitral valve: Mildly calcified annulus. There was trivial   regurgitation. - Left atrium: The atrium was mildly dilated. - Right ventricle: The cavity size was normal. Systolic function   was normal. - Right atrium: The atrium was mildly dilated. - Atrial septum: Possible PFO but not fully visualized. - Tricuspid valve: Peak RV-RA gradient (S): 27 mm Hg. - Pulmonary arteries: PA peak pressure: 30 mm Hg (S). - Inferior vena cava: The vessel was normal in size. The   respirophasic  diameter changes were in the normal range (= 50%),   consistent with normal central venous pressure.  Impressions:  - Normal LV size with mild LV hypertrophy. EF 55-60%. Basal   inferior hypokinesis. Normal RV size and systolic function. No   significant valvular abnormalities. Possible PFO present but not   fully visualized.  Myocardial perfusion scan 12/01/2015: Study Highlights    Nuclear stress EF: 54%.  The left ventricular ejection fraction is mildly decreased (45-54%).  There was no ST segment deviation noted during stress.  No T wave inversion was noted during stress.  Defect 1: There is a small defect of moderate severity present in the apex location. Prior infarct vs. apical thinning. No ischemia.  The study is normal.   ASSESSMENT AND PLAN: 1.  Coronary artery disease, native vessel: No clear anginal symptoms, but she does have progressive shortness of breath with a functional class III limitation. She has not always had chest pain with her ischemic symptoms in the past. Considering her known CAD, history of two-vessel stenting, and progressive shortness of breath, I have recommended right and left heart catheterization with possible PCI if indicated. We discussed noninvasive testing as an option, but we both favored moving forward with a more definitive evaluation as she has become increasingly short of breath.  I have reviewed the risks, indications, and alternatives to cardiac catheterization, possible angioplasty, and stenting with the patient. Risks include but are not limited to bleeding, infection, vascular injury, stroke, myocardial infection, arrhythmia, kidney injury, radiation-related injury in the case of prolonged fluoroscopy use, emergency cardiac surgery, and death. The patient understands the risks of serious complication is 1-2 in 6283 with diagnostic cardiac cath and 1-2% or less with angioplasty/stenting.   2. Shortness of breath, progressive: See  discussion above. Right and left heart cath to assess filling pressures, hemodynamics, and coronary  anatomy. I reviewed outside labs and she did have a BNP that was just at the upper limit of normal. I do not appreciate JVD or significant edema on her exam today.  3. Hypertension: Continue amlodipine, isosorbide, and metoprolol. Blood pressure is well controlled today.  4. Hyperlipidemia: Patient is statin intolerant.  Current medicines are reviewed with the patient today.  The patient does not have concerns regarding medicines.  Labs/ tests ordered today include:   Orders Placed This Encounter  Procedures  . Basic metabolic panel  . CBC with Differential/Platelet  . INR/PT  . PTT    Disposition:   FU pending cath results.   Deatra James, MD  04/06/2017 1:38 PM    [facility] [address], [place], [place]  [address] Phone: [phone]; Fax: [phone]

## 2017-04-11 ENCOUNTER — Encounter (HOSPITAL_COMMUNITY): Payer: Self-pay | Admitting: Interventional Cardiology

## 2017-04-30 DIAGNOSIS — G4733 Obstructive sleep apnea (adult) (pediatric): Secondary | ICD-10-CM | POA: Diagnosis not present

## 2017-04-30 DIAGNOSIS — J449 Chronic obstructive pulmonary disease, unspecified: Secondary | ICD-10-CM | POA: Diagnosis not present

## 2017-05-06 DIAGNOSIS — G4733 Obstructive sleep apnea (adult) (pediatric): Secondary | ICD-10-CM | POA: Diagnosis not present

## 2017-05-06 DIAGNOSIS — J449 Chronic obstructive pulmonary disease, unspecified: Secondary | ICD-10-CM | POA: Diagnosis not present

## 2017-05-24 DIAGNOSIS — S2231XA Fracture of one rib, right side, initial encounter for closed fracture: Secondary | ICD-10-CM | POA: Diagnosis not present

## 2017-05-24 DIAGNOSIS — M1612 Unilateral primary osteoarthritis, left hip: Secondary | ICD-10-CM | POA: Diagnosis not present

## 2017-05-26 ENCOUNTER — Ambulatory Visit: Payer: PPO | Admitting: Gastroenterology

## 2017-05-26 ENCOUNTER — Encounter: Payer: Self-pay | Admitting: Gastroenterology

## 2017-05-26 VITALS — BP 118/74 | HR 72 | Ht 68.0 in | Wt 210.0 lb

## 2017-05-26 DIAGNOSIS — R1032 Left lower quadrant pain: Secondary | ICD-10-CM

## 2017-05-26 DIAGNOSIS — G8929 Other chronic pain: Secondary | ICD-10-CM | POA: Insufficient documentation

## 2017-05-26 DIAGNOSIS — Z7901 Long term (current) use of anticoagulants: Secondary | ICD-10-CM

## 2017-05-26 DIAGNOSIS — Z86010 Personal history of colonic polyps: Secondary | ICD-10-CM | POA: Diagnosis not present

## 2017-05-26 DIAGNOSIS — R1031 Right lower quadrant pain: Secondary | ICD-10-CM | POA: Insufficient documentation

## 2017-05-26 MED ORDER — CIPROFLOXACIN HCL 500 MG PO TABS: 500.0000 mg | ORAL_TABLET | Freq: Two times a day (BID) | ORAL | 0 refills | Status: AC

## 2017-05-26 MED ORDER — NA SULFATE-K SULFATE-MG SULF 17.5-3.13-1.6 GM/177ML PO SOLN: 1.0000 | Freq: Once | ORAL | 0 refills | Status: AC

## 2017-05-26 MED ORDER — METRONIDAZOLE 500 MG PO TABS: 500.0000 mg | ORAL_TABLET | Freq: Three times a day (TID) | ORAL | 0 refills | Status: AC

## 2017-05-26 NOTE — Progress Notes (Signed)
05/26/2017 Stephanie Alvarado [id number] 18-Oct-1940   HISTORY OF PRESENT ILLNESS: This is a pleasant 76 year old female who was previously known to Dr. Sharlett Iles.  Her care will be assumed by Dr. Henrene Pastor.  She has past medical history of anxiety, coronary artery disease with stents placed in the past-last was in 2011 and is on Plavix that is controlled by Dr. Lamona Curl, chronic diastolic heart failure, hypertension, hyperlipidemia, obesity, sleep related hypoventilation for which she uses CPAP.  Also apparently uses oxygen on occasion at home as well.  She is here today to discuss colonoscopy.  Her last colonoscopy was in November 2013 at which time she was found to have 2 flat polyps that were removed and were tubular adenomas on pathology.  She was also found to have severe diverticulosis in the descending and sigmoid colon.  While she is here today she also complains of severe right and left lower quadrant abdominal pains.  She says that they have been present actually for about the past month or so.  She says that some days it will be quite severe and then other days they seem a little bit better.  She says that yesterday and today she was having quite a bit of pain.  She denies nausea, vomiting, fevers, chills.  Denies any rectal bleeding.  She has complained of lower abdominal pains over the years and has had a few CT scans performed, none of which have ever really documented any diverticulitis.  Past Medical History:  Diagnosis Date  . Allergic rhinitis, cause unspecified   . Anemia, unspecified   . Anxiety state, unspecified   . Atherosclerosis of aorta ([place])    TEE, June, 2010, grade 4 aortic arch atherosclerosis , Dr. Aundra Dubin  . CAD (coronary artery disease)    DES and circumflex 2006 /  DES to LAD 2011  . Carotid artery disease ([place])    Doppler, November, 2011, stable, 0-39% bilateral, turbulent flow left subclavian  . Chronic diastolic heart failure ([place])   . Cyst of thyroid   .  Diverticulosis of colon (without mention of hemorrhage)   . Dysphasia    Possibly esophageal stricture  . Ejection fraction     EF 60%, Echo, November 12, 2008, /  EF 55%, TEE, November 17, 2008  . Esophageal reflux   . Hiatal hernia   . Hyperlipemia   . Hypertension   . Lumbago   . Lumbar disc disease   . MVP (mitral valve prolapse)   . Nonspecific abnormal finding in stool contents   . Obesity, unspecified   . Osteoarthrosis, unspecified whether generalized or localized, unspecified site   . Osteopenia   . Other diseases of lung, not elsewhere classified   . Palpitations    October, 2012  . Peripheral vascular disease, unspecified ([place])   . Personal history of colonic polyps    ADENOMATOUS POLYP  . PFO (patent foramen ovale)    Patient had a very small PFO by TEE 2010.  This was seen only by bubble analysis during Valsalva  . Shortness of breath   . Sleep apnea   . Sleep related hypoventilation/hypoxemia in conditions classifiable elsewhere   . Stroke [facility]) 11/2008   Treated with TPA? /     2010, TEE no left atrial clot, probably from atherosclerosis of the aortic arch  . 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 Victorino Dike.  Marland Kitchen Unspecified cerebral artery occlusion with cerebral infarction   . Unspecified  venous (peripheral) insufficiency    Past Surgical History:  Procedure Laterality Date  . BACK SURGERY    . cataracts     both eyes  . CHOLECYSTECTOMY    . CORONARY ANGIOPLASTY WITH STENT PLACEMENT  1990's  . LAPAROSCOPIC HYSTERECTOMY    . LUMBAR DISC SURGERY     X 3  . RIGHT/LEFT HEART CATH AND CORONARY ANGIOGRAPHY N/A 04/10/2017   Procedure: RIGHT/LEFT HEART CATH AND CORONARY ANGIOGRAPHY;  Surgeon: Belva Crome, MD;  Location: [facility];  Service: Cardiovascular;  Laterality: N/A;  . urethral suspension  1993   Dr. Nori Riis  . VARICOSE VEIN SURGERY     x 2    reports that she quit smoking about 18 years ago. Her smoking use included cigarettes. She  has a 40.00 pack-year smoking history. she has never used smokeless tobacco. She reports that she does not drink alcohol or use drugs. family history includes Alzheimer's disease in her mother; Breast cancer in her unknown relative; Colon cancer in her paternal aunt; Heart attack in her father; Heart disease in her father. Allergies  Allergen Reactions  . Codeine Itching    REACTION: itch  . Lipitor [Atorvastatin] Other (See Comments)    myalgias      Outpatient Encounter Medications as of 05/26/2017  Medication Sig  . amLODipine (NORVASC) 5 MG tablet Take 5 mg by mouth daily.    Marland Kitchen buPROPion (WELLBUTRIN XL) 150 MG 24 hr tablet Take 150 mg by mouth 2 (two) times daily.   . clopidogrel (PLAVIX) 75 MG tablet Take 1 tablet (75 mg total) by mouth daily.  . furosemide (LASIX) 40 MG tablet Take 80 mg by mouth daily.   Marland Kitchen gabapentin (NEURONTIN) 300 MG capsule Take 300 mg by mouth 3 (three) times daily.  . isosorbide mononitrate (IMDUR) 30 MG 24 hr tablet Take 30 mg by mouth daily.  . metoprolol tartrate (LOPRESSOR) 25 MG tablet Take 1 tablet (25 mg total) by mouth 2 (two) times daily.  . nitroGLYCERIN (NITROSTAT) 0.4 MG SL tablet Place 0.4 mg under the tongue every 5 (five) minutes as needed for chest pain (TAKE UP TO 3 DOSES BEFORE CALLING 911).  . pantoprazole (PROTONIX) 40 MG tablet Take 40 mg by mouth 2 (two) times daily.   . polyethylene glycol powder (GLYCOLAX/MIRALAX) powder Take 17 g by mouth daily as needed for moderate constipation.   . rosuvastatin (CRESTOR) 10 MG tablet Take 10 mg by mouth at bedtime.   No facility-administered encounter medications on file as of 05/26/2017.      REVIEW OF SYSTEMS  : All other systems reviewed and negative except where noted in the History of Present Illness.   PHYSICAL EXAM: BP 118/74   Pulse 72   Ht 5\' 8"  (1.727 m)   Wt 210 lb (95.3 kg)   BMI 31.93 kg/m  General: Well developed white female in no acute distress Head: Normocephalic and  atraumatic Eyes:  Sclerae anicteric, conjunctiva pink. Ears: Normal auditory acuity Lungs: Clear throughout to auscultation; no increased WOB. Heart: Regular rate and rhythm; no M/R/G. Abdomen: Soft, non-distended.  BS present.  Moderate left and right lower quadrant TTP, R actually > than the L. Rectal:  Will be done at the time of colonoscopy. Musculoskeletal: Symmetrical with no gross deformities  Skin: No lesions on visible extremities Extremities: No edema  Neurological: Alert oriented x 4, grossly non-focal Psychological:  Alert and cooperative. Normal mood and affect  ASSESSMENT AND PLAN: *Personal history of  colon polyps:  Adenomatous polyps on colonoscopy in 2013.  Will schedule with Dr. Henrene Pastor.  This is going to need to be done at [facility] hospital due to "occasional" oxygen use at home.  Schedule for February. *Chronic antiplatelet use due to CAD with stents in the past:  Hold Plavix 5 days before procedure - will instruct when and how to resume after procedure. Risks and benefits of procedure including bleeding, perforation, infection, missed lesions, medication reactions and possible hospitalization or surgery if complications occur explained. Additional rare but real risk of cardiovascular event such as heart attack or ischemia/infarct of other organs off of Plavix explained and need to seek urgent help if this occurs. Will communicate by phone or EMR with patient's prescribing provider, Dr. Burt Knack, to confirm that holding Plavix is reasonable in this case.  *Right and left lower abdominal pain:  ? Diverticulitis.  She is quite tender on exam.  Had left sided diverticulosis on last colonoscopy.  A few CT scans over the years for complaints of abdominal pain have been negative for diverticulitis.  I am going to go ahead and empirically treat her with a 7-day course of Cipro and Flagyl.  I would like her to call back here in 10-14 days with an update on her symptoms.  If pain persists at the  same degree, then may consider repeat CT scan at that time.   CC:  Caryl Bis, MD

## 2017-05-26 NOTE — Progress Notes (Signed)
Reviewed assessment and initial plans. At 81 with multiple comorbidities and minimal findings on previous colonoscopy, I would forego surveillance colonoscopy. I agree with aggressive workup of abdominal pain including CT scan. Please cancel endoscopic procedures and have her follow-up with me in the office. Please communicate this with the patient ASAP and let the patient know that you reviewed this with me after her visit. Thanks

## 2017-05-26 NOTE — Patient Instructions (Addendum)
We will call you with the February Schedule for the hospital for the colonoscopy with Dr. Scarlette Shorts. The reason we have to schedule at the hospital is due to your use of oxygen as needed.   We sent prescriptions to [facility]    Call us back in 10-14 days with an update. Ask for Jessic'a nurse, Katina Degree RN.

## 2017-05-29 DIAGNOSIS — E782 Mixed hyperlipidemia: Secondary | ICD-10-CM | POA: Diagnosis not present

## 2017-05-29 DIAGNOSIS — I25111 Atherosclerotic heart disease of native coronary artery with angina pectoris with documented spasm: Secondary | ICD-10-CM | POA: Diagnosis not present

## 2017-05-29 DIAGNOSIS — E039 Hypothyroidism, unspecified: Secondary | ICD-10-CM | POA: Diagnosis not present

## 2017-05-29 DIAGNOSIS — R7301 Impaired fasting glucose: Secondary | ICD-10-CM | POA: Diagnosis not present

## 2017-05-29 DIAGNOSIS — Z23 Encounter for immunization: Secondary | ICD-10-CM | POA: Diagnosis not present

## 2017-05-29 DIAGNOSIS — F331 Major depressive disorder, recurrent, moderate: Secondary | ICD-10-CM | POA: Diagnosis not present

## 2017-05-29 DIAGNOSIS — I10 Essential (primary) hypertension: Secondary | ICD-10-CM | POA: Diagnosis not present

## 2017-05-29 DIAGNOSIS — Z6832 Body mass index (BMI) 32.0-32.9, adult: Secondary | ICD-10-CM | POA: Diagnosis not present

## 2017-05-30 DIAGNOSIS — G4733 Obstructive sleep apnea (adult) (pediatric): Secondary | ICD-10-CM | POA: Diagnosis not present

## 2017-05-30 DIAGNOSIS — J449 Chronic obstructive pulmonary disease, unspecified: Secondary | ICD-10-CM | POA: Diagnosis not present

## 2017-05-31 ENCOUNTER — Telehealth: Payer: Self-pay | Admitting: Gastroenterology

## 2017-05-31 ENCOUNTER — Other Ambulatory Visit: Payer: Self-pay | Admitting: *Deleted

## 2017-05-31 DIAGNOSIS — R1031 Right lower quadrant pain: Secondary | ICD-10-CM

## 2017-05-31 DIAGNOSIS — Z86010 Personal history of colonic polyps: Secondary | ICD-10-CM

## 2017-05-31 DIAGNOSIS — R1032 Left lower quadrant pain: Secondary | ICD-10-CM

## 2017-05-31 NOTE — Telephone Encounter (Signed)
Reviewed assessment and initial plans. At 45 with multiple comorbidities and minimal findings on previous colonoscopy, I would forego surveillance colonoscopy. I agree with aggressive workup of abdominal pain including CT scan. Please cancel endoscopic procedures and have her follow-up with me in the office. Please communicate this with the patient ASAP and let the patient know that you reviewed this with me after her visit. Thanks  Left message on machine to call back

## 2017-06-01 NOTE — Telephone Encounter (Signed)
The pt aware that the colon will not be needed at this time and she will be called after Stephanie Alvarado reviews the note for possible CT scan

## 2017-06-05 DIAGNOSIS — J449 Chronic obstructive pulmonary disease, unspecified: Secondary | ICD-10-CM | POA: Diagnosis not present

## 2017-06-05 DIAGNOSIS — G4733 Obstructive sleep apnea (adult) (pediatric): Secondary | ICD-10-CM | POA: Diagnosis not present

## 2017-06-05 DIAGNOSIS — M170 Bilateral primary osteoarthritis of knee: Secondary | ICD-10-CM | POA: Diagnosis not present

## 2017-06-07 ENCOUNTER — Telehealth: Payer: Self-pay | Admitting: *Deleted

## 2017-06-07 NOTE — Telephone Encounter (Signed)
I called the patient and asked her how she was feeling. She saw Alonza Bogus PA on 05-26-2017.  Jessica prescribed Cipro and Flagyl for the severe RLQ and LLQ  abdominal pains . The patient reports she is feeling much better. Having no pains at this time.  She said if she has any more trouble she will call and ask to see Janett Billow. She wasn't happy that a nurse from here called her and told her that Dr. Henrene Pastor thought she had too many health problems and he think it necessary she have a colonoscopy.

## 2017-06-08 ENCOUNTER — Telehealth: Payer: Self-pay

## 2017-06-08 NOTE — Telephone Encounter (Signed)
   [facility] Pre-operative Risk Assessment    Request for surgical clearance:  1. What type of surgery is being performed? Right total knee replacement   2. When is this surgery scheduled? 08/01/17  3. Are there any medications that need to be held prior to surgery and how long? Please advise as to how many days prior to surgery patient can stop plavix and please give any other special recommendations.  4. Practice name and name of physician performing surgery? [facility]. Dr. Mardelle Matte  5. What is your office phone and fax number? Phone: [phone]. Fax: [phone]. ATTN: Sherri  6. Anesthesia type (None, local, MAC, general) ? Not specified.   Stephanie Alvarado 06/08/2017, 4:02 PM  _________________________________________________________________   (provider comments below)

## 2017-06-08 NOTE — Telephone Encounter (Signed)
Patient not on anticoagulation therapy.  Will defer antiplatelet directions to pre-op croup

## 2017-06-09 NOTE — Telephone Encounter (Signed)
   Primary Cardiologist: Dr. Burt Knack   Chart reviewed as part of pre-operative protocol coverage. Patient was contacted 06/09/2017 in reference to pre-operative risk assessment for pending surgery as outlined below.  Stephanie Alvarado was last seen on 04/06/17 by Dr. Burt Knack and had [facility] on 10/29>>No high-grade coronary obstruction or abnormal hemodynamics to explain the severity of dyspnea. The distal margin of the LAD stent is bordering on significant.  Needs to be followed over time for progression.  Continued on medical therapy. Since that day, Stephanie Alvarado has done well. She denies any chest pain. No exertional CP. Still with occasional mild dyspnea, but nothing beyond her usual baseline.   Therefore, based on ACC/AHA guidelines, the patient would be at acceptable risk for the planned procedure without further cardiovascular testing. However, will consult Dr. Burt Knack to see if he is ok with patient holding Plavix x 5 days for procedure.    [name], PA-C 06/09/2017, 1:49 PM

## 2017-06-09 NOTE — Telephone Encounter (Signed)
   I personally spoke with Dr. Burt Knack regarding Plavix. Ok to hold 5 days prior to TKR. Will route clearance to ortho and will notify pt.

## 2017-06-22 ENCOUNTER — Telehealth: Payer: Self-pay | Admitting: *Deleted

## 2017-06-22 NOTE — Telephone Encounter (Signed)
Correction on the note I did on 06-07-2017, Dr. Henrene Pastor does not want to do a colonoscopy on this patient at this time due to her multiple health problems.

## 2017-06-30 DIAGNOSIS — G4733 Obstructive sleep apnea (adult) (pediatric): Secondary | ICD-10-CM | POA: Diagnosis not present

## 2017-06-30 DIAGNOSIS — J449 Chronic obstructive pulmonary disease, unspecified: Secondary | ICD-10-CM | POA: Diagnosis not present

## 2017-07-06 DIAGNOSIS — J449 Chronic obstructive pulmonary disease, unspecified: Secondary | ICD-10-CM | POA: Diagnosis not present

## 2017-07-06 DIAGNOSIS — G4733 Obstructive sleep apnea (adult) (pediatric): Secondary | ICD-10-CM | POA: Diagnosis not present

## 2017-07-07 ENCOUNTER — Ambulatory Visit: Payer: PPO | Admitting: Internal Medicine

## 2017-07-13 DIAGNOSIS — G4733 Obstructive sleep apnea (adult) (pediatric): Secondary | ICD-10-CM | POA: Diagnosis not present

## 2017-07-20 ENCOUNTER — Inpatient Hospital Stay (HOSPITAL_COMMUNITY): Admission: RE | Admit: 2017-07-20 | Payer: PPO | Source: Ambulatory Visit

## 2017-07-21 ENCOUNTER — Other Ambulatory Visit: Payer: Self-pay | Admitting: Orthopedic Surgery

## 2017-07-24 ENCOUNTER — Encounter: Payer: PPO | Admitting: Internal Medicine

## 2017-07-31 DIAGNOSIS — J449 Chronic obstructive pulmonary disease, unspecified: Secondary | ICD-10-CM | POA: Diagnosis not present

## 2017-07-31 DIAGNOSIS — G4733 Obstructive sleep apnea (adult) (pediatric): Secondary | ICD-10-CM | POA: Diagnosis not present

## 2017-08-01 ENCOUNTER — Ambulatory Visit: Admit: 2017-08-01 | Payer: PPO | Admitting: Orthopedic Surgery

## 2017-08-01 SURGERY — ARTHROPLASTY, KNEE, UNICOMPARTMENTAL
Anesthesia: Choice | Laterality: Right

## 2017-08-06 DIAGNOSIS — J449 Chronic obstructive pulmonary disease, unspecified: Secondary | ICD-10-CM | POA: Diagnosis not present

## 2017-08-06 DIAGNOSIS — G4733 Obstructive sleep apnea (adult) (pediatric): Secondary | ICD-10-CM | POA: Diagnosis not present

## 2017-08-08 ENCOUNTER — Encounter (HOSPITAL_COMMUNITY): Payer: Self-pay

## 2017-08-08 ENCOUNTER — Ambulatory Visit (HOSPITAL_COMMUNITY): Admit: 2017-08-08 | Payer: PPO | Admitting: Internal Medicine

## 2017-08-08 SURGERY — COLONOSCOPY WITH PROPOFOL
Anesthesia: Monitor Anesthesia Care

## 2017-08-28 DIAGNOSIS — J449 Chronic obstructive pulmonary disease, unspecified: Secondary | ICD-10-CM | POA: Diagnosis not present

## 2017-08-28 DIAGNOSIS — G4733 Obstructive sleep apnea (adult) (pediatric): Secondary | ICD-10-CM | POA: Diagnosis not present

## 2017-08-30 ENCOUNTER — Telehealth: Payer: Self-pay | Admitting: Gastroenterology

## 2017-08-30 NOTE — Telephone Encounter (Signed)
Abdominal pain from diverticulitis would not just go away during the day.  I am not willing to give her antibiotics based on description of her symptoms.  We could get a CT scan abdomen and pelvis with contrast first to evaluate since she has not had one in a while.  Please schedule if she is agreeable.  Thank you,  Jess

## 2017-08-30 NOTE — Telephone Encounter (Signed)
Left message on machine to call back  

## 2017-08-30 NOTE — Telephone Encounter (Signed)
The pt was last seen in December and was treated with abx for abd pain.  She is beginning to wake up with abd pain on the right side.  Wakes her up and she uses a heating pad and that eases it up.  No pain during the day.  The pain started about 2 weeks ago.  BM's are normal for her, she does have to strain but that is not unusual for her.  She has been scheduled to see Janett Billow on 09/06/17.  Janett Billow any further req's?

## 2017-08-31 NOTE — Telephone Encounter (Signed)
The pt states she will come in the office and see Stephanie Alvarado prior to having CT

## 2017-09-03 DIAGNOSIS — J449 Chronic obstructive pulmonary disease, unspecified: Secondary | ICD-10-CM | POA: Diagnosis not present

## 2017-09-03 DIAGNOSIS — G4733 Obstructive sleep apnea (adult) (pediatric): Secondary | ICD-10-CM | POA: Diagnosis not present

## 2017-09-06 ENCOUNTER — Encounter: Payer: Self-pay | Admitting: Gastroenterology

## 2017-09-06 ENCOUNTER — Other Ambulatory Visit (INDEPENDENT_AMBULATORY_CARE_PROVIDER_SITE_OTHER): Payer: PPO

## 2017-09-06 ENCOUNTER — Ambulatory Visit: Payer: PPO | Admitting: Gastroenterology

## 2017-09-06 VITALS — BP 110/74 | HR 71 | Ht 67.5 in | Wt 206.0 lb

## 2017-09-06 DIAGNOSIS — R1031 Right lower quadrant pain: Secondary | ICD-10-CM

## 2017-09-06 DIAGNOSIS — R197 Diarrhea, unspecified: Secondary | ICD-10-CM | POA: Insufficient documentation

## 2017-09-06 LAB — CBC WITH DIFFERENTIAL/PLATELET
Basophils Absolute: 0 10*3/uL (ref 0.0–0.1)
Basophils Relative: 1 % (ref 0.0–3.0)
EOS ABS: 0.2 10*3/uL (ref 0.0–0.7)
EOS PCT: 3.8 % (ref 0.0–5.0)
HCT: 39.8 % (ref 36.0–46.0)
HEMOGLOBIN: 13.3 g/dL (ref 12.0–15.0)
Lymphocytes Relative: 24.4 % (ref 12.0–46.0)
Lymphs Abs: 1.1 10*3/uL (ref 0.7–4.0)
MCHC: 33.4 g/dL (ref 30.0–36.0)
MCV: 89.7 fl (ref 78.0–100.0)
MONO ABS: 1.1 10*3/uL — AB (ref 0.1–1.0)
Monocytes Relative: 24.2 % — ABNORMAL HIGH (ref 3.0–12.0)
Neutro Abs: 2 10*3/uL (ref 1.4–7.7)
Neutrophils Relative %: 46.4 % (ref 43.0–77.0)
Platelets: 188 10*3/uL (ref 150.0–400.0)
RBC: 4.44 Mil/uL (ref 3.87–5.11)
RDW: 16.1 % — ABNORMAL HIGH (ref 11.5–15.5)
WBC: 4.4 10*3/uL (ref 4.0–10.5)

## 2017-09-06 LAB — BASIC METABOLIC PANEL
BUN: 12 mg/dL (ref 6–23)
CHLORIDE: 105 meq/L (ref 96–112)
CO2: 29 mEq/L (ref 19–32)
Calcium: 10.1 mg/dL (ref 8.4–10.5)
Creatinine, Ser: 0.76 mg/dL (ref 0.40–1.20)
GFR: 78.12 mL/min (ref >60.00)
GLUCOSE: 112 mg/dL — AB (ref 70–99)
POTASSIUM: 4.1 meq/L (ref 3.5–5.1)
SODIUM: 141 meq/L (ref 135–145)

## 2017-09-06 NOTE — Progress Notes (Signed)
Assessment and plans noted ?

## 2017-09-06 NOTE — Progress Notes (Signed)
09/06/2017 HINA GUPTA [id number] Apr 04, 1941   HISTORY OF PRESENT ILLNESS:  This is a pleasant 77 year old female who is a patient of Dr. Blanch Media.  She has past medical history of anxiety, coronary artery disease with stents placed in the past-last was in 2011 and is on Plavix that is controlled by Dr. Burt Knack, chronic diastolic heart failure, hypertension, hyperlipidemia, obesity, sleep related hypoventilation for which she uses CPAP.  Also apparently uses oxygen on occasion at home as well.  Anyway, she was seen here in 05/2017 at which time she was complaining of RLQ abdominal pain.  She improved with a course of antibiotics.  Has complained of similar lower abdominal pain's over the years and CT scans have never shown any evidence of diverticulitis or other causes of her pain.  She was going to be scheduled for surveillance colonoscopy as well, but Dr. Henrene Pastor said that due to her age and other medical problems that he would defer it for now when he wanted to see her back in the office.  She was upset by this and did not come back for her appointment with him to discuss this further.  She is here now again today with complaints of right lower quadrant abdominal pain.  She says that this pain has been on and off for several weeks.  Wakes her from sleep at night.  She thinks it is mostly when she is turning over in the bed and it hurts so bad that it causes her to yell out.  She says that she puts a heating pad on it and is able to fall back to sleep.  It does bother her throughout the day as well.  Her only abdominal surgery is a cholecystectomy.  She developed some diarrhea yesterday and this morning, but this is new and feels that it is likely some type of GI bug.  Her last colonoscopy was in November 2013 at which time she was found to have 2 flat polyps that were removed and were tubular adenomas on pathology. She was also found to have severe diverticulosis in the descending and sigmoid  colon.   Past Medical History:  Diagnosis Date  . Allergic rhinitis, cause unspecified   . Anemia, unspecified   . Anxiety state, unspecified   . Atherosclerosis of aorta ([place])    TEE, June, 2010, grade 4 aortic arch atherosclerosis , Dr. Aundra Dubin  . CAD (coronary artery disease)    DES and circumflex 2006 /  DES to LAD 2011  . Carotid artery disease ([place])    Doppler, November, 2011, stable, 0-39% bilateral, turbulent flow left subclavian  . Chronic diastolic heart failure ([place])   . Cyst of thyroid   . Diverticulosis of colon (without mention of hemorrhage)   . Dysphasia    Possibly esophageal stricture  . Ejection fraction     EF 60%, Echo, November 12, 2008, /  EF 55%, TEE, November 17, 2008  . Esophageal reflux   . Hiatal hernia   . Hyperlipemia   . Hypertension   . Lumbago   . Lumbar disc disease   . MVP (mitral valve prolapse)   . Nonspecific abnormal finding in stool contents   . Obesity, unspecified   . Osteoarthrosis, unspecified whether generalized or localized, unspecified site   . Osteopenia   . Other diseases of lung, not elsewhere classified   . Palpitations    October, 2012  . Peripheral vascular disease, unspecified ([place])   . Personal  history of colonic polyps    ADENOMATOUS POLYP  . PFO (patent foramen ovale)    Patient had a very small PFO by TEE 2010.  This was seen only by bubble analysis during Valsalva  . Shortness of breath   . Sleep apnea   . Sleep related hypoventilation/hypoxemia in conditions classifiable elsewhere   . Stroke [facility]) 11/2008   Treated with TPA? /     2010, TEE no left atrial clot, probably from atherosclerosis of the aortic arch  . 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 Victorino Dike.  Marland Kitchen Unspecified cerebral artery occlusion with cerebral infarction   . Unspecified venous (peripheral) insufficiency    Past Surgical History:  Procedure Laterality Date  . BACK SURGERY    . cataracts     both eyes  . CHOLECYSTECTOMY    .  CORONARY ANGIOPLASTY WITH STENT PLACEMENT  1990's  . LAPAROSCOPIC HYSTERECTOMY    . LUMBAR DISC SURGERY     X 3  . RIGHT/LEFT HEART CATH AND CORONARY ANGIOGRAPHY N/A 04/10/2017   Procedure: RIGHT/LEFT HEART CATH AND CORONARY ANGIOGRAPHY;  Surgeon: Belva Crome, MD;  Location: [facility];  Service: Cardiovascular;  Laterality: N/A;  . urethral suspension  1993   Dr. Nori Riis  . VARICOSE VEIN SURGERY     x 2    reports that she quit smoking about 19 years ago. Her smoking use included cigarettes. She has a 40.00 pack-year smoking history. She has never used smokeless tobacco. She reports that she does not drink alcohol or use drugs. family history includes Alzheimer's disease in her mother; Breast cancer in her unknown relative; Colon cancer in her paternal aunt; Heart attack in her father; Heart disease in her father. Allergies  Allergen Reactions  . Codeine Itching    REACTION: itch  . Lipitor [Atorvastatin] Other (See Comments)    myalgias      Outpatient Encounter Medications as of 09/06/2017  Medication Sig  . amLODipine (NORVASC) 5 MG tablet Take 5 mg by mouth daily.    Marland Kitchen buPROPion (WELLBUTRIN XL) 150 MG 24 hr tablet Take 150 mg by mouth 2 (two) times daily.   . clopidogrel (PLAVIX) 75 MG tablet Take 1 tablet (75 mg total) by mouth daily.  . furosemide (LASIX) 40 MG tablet Take 80 mg by mouth daily.   Marland Kitchen gabapentin (NEURONTIN) 300 MG capsule Take 300 mg by mouth 3 (three) times daily.  . isosorbide mononitrate (IMDUR) 30 MG 24 hr tablet Take 30 mg by mouth daily.  . metoprolol tartrate (LOPRESSOR) 25 MG tablet Take 1 tablet (25 mg total) by mouth 2 (two) times daily.  . nitroGLYCERIN (NITROSTAT) 0.4 MG SL tablet Place 0.4 mg under the tongue every 5 (five) minutes as needed for chest pain (TAKE UP TO 3 DOSES BEFORE CALLING 911).  . pantoprazole (PROTONIX) 40 MG tablet Take 40 mg by mouth 2 (two) times daily.   . polyethylene glycol powder (GLYCOLAX/MIRALAX) powder Take 17 g  by mouth daily as needed for moderate constipation.   . rosuvastatin (CRESTOR) 10 MG tablet Take 10 mg by mouth at bedtime.   No facility-administered encounter medications on file as of 09/06/2017.      REVIEW OF SYSTEMS  : All other systems reviewed and negative except where noted in the History of Present Illness.   PHYSICAL EXAM: BP 110/74   Pulse 71   Ht 5' 7.5" (1.715 m)   Wt 206 lb (93.4 kg)  BMI 31.79 kg/m  General: Well developed white female in no acute distress Head: Normocephalic and atraumatic Eyes:  Sclerae anicteric, conjunctiva pink. Ears: Normal auditory acuity Lungs: Clear throughout to auscultation; no increased WOB. Heart: Regular rate and rhythm; no M/R/G. Abdomen: Soft, non-distended.  BS present.  Moderate right sided TTP. Musculoskeletal: Symmetrical with no gross deformities  Skin: No lesions on visible extremities Extremities: No edema  Neurological: Alert oriented x 4, grossly non-focal Psychological:  Alert and cooperative. Normal mood and affect  ASSESSMENT AND PLAN: *Moderate RLQ abdominal pain:  Constant, but more so at night.  Wakes her from sleep.  Had some diarrhea as well but that just started yesterday.  Has had complaints of lower abdominal pains multiple times in the past with unremarkable CT scans over the years, but last was 4.5 years ago.  Will check CT scan of the abdomen and pelvis with contrast.  Will check a CBC and BMP today.   CC:  Caryl Bis, MD

## 2017-09-06 NOTE — Patient Instructions (Signed)
If you are age 79 or older, your body mass index should be between 23-30. Your Body mass index is 31.79 kg/m. If this is out of the aforementioned range listed, please consider follow up with your Primary Care Provider.  If you are age 79 or younger, your body mass index should be between 19-25. Your Body mass index is 31.79 kg/m. If this is out of the aformentioned range listed, please consider follow up with your Primary Care Provider.   You have been scheduled for a CT scan of the abdomen and pelvis at [facility] ([address]---this is in the same building as Press [facility]).   You are scheduled on 09/15/17 at 4:00pm. You should arrive 15 minutes prior to your appointment time for registration. Please follow the written instructions below on the day of your exam:  WARNING: IF YOU ARE ALLERGIC TO IODINE/X-RAY DYE, PLEASE NOTIFY RADIOLOGY IMMEDIATELY AT [phone]! YOU WILL BE GIVEN A 13 HOUR PREMEDICATION PREP.  1) Do not eat or drink anything after 12:00pm (4 hours prior to your test) 2) You have been given 2 bottles of oral contrast to drink. The solution may taste better if refrigerated, but do NOT add ice or any other liquid to this solution. Shake well before drinking.    Drink 1 bottle of contrast @ 2:00pm (2 hours prior to your exam)  Drink 1 bottle of contrast @ 3:00pm (1 hour prior to your exam)  You may take any medications as prescribed with a small amount of water except for the following: Metformin, Glucophage, Glucovance, Avandamet, Riomet, Fortamet, Actoplus Met, Janumet, Glumetza or Metaglip. The above medications must be held the day of the exam AND 48 hours after the exam.  The purpose of you drinking the oral contrast is to aid in the visualization of your intestinal tract. The contrast solution may cause some diarrhea. Before your exam is started, you will be given a small amount of fluid to drink. Depending on your individual set of symptoms, you may  also receive an intravenous injection of x-ray contrast/dye. Plan on being at [facility] for 79 minutes or longer, depending on the type of exam you are having performed.  This test typically takes 30-45 minutes to complete.  If you have any questions regarding your exam or if you need to reschedule, you may call the CT department at [phone] between the hours of 8:00 am and 5:00 pm, Monday-Friday.  ________________________________________________________________________  Lujean Rave you, [facility]

## 2017-09-15 ENCOUNTER — Ambulatory Visit (INDEPENDENT_AMBULATORY_CARE_PROVIDER_SITE_OTHER)
Admission: RE | Admit: 2017-09-15 | Discharge: 2017-09-15 | Disposition: A | Discharge status: Home / Self Care | Payer: PPO | Source: Ambulatory Visit | Attending: Gastroenterology | Admitting: Gastroenterology

## 2017-09-15 DIAGNOSIS — R197 Diarrhea, unspecified: Secondary | ICD-10-CM

## 2017-09-15 DIAGNOSIS — R1031 Right lower quadrant pain: Secondary | ICD-10-CM | POA: Diagnosis not present

## 2017-09-15 DIAGNOSIS — K5730 Diverticulosis of large intestine without perforation or abscess without bleeding: Secondary | ICD-10-CM | POA: Diagnosis not present

## 2017-09-15 MED ORDER — IOPAMIDOL (ISOVUE-300) INJECTION 61%
100.0000 mL | Freq: Once | INTRAVENOUS | Status: AC | PRN
  Administered 2017-09-15: 100 mL via INTRAVENOUS

## 2017-09-18 DIAGNOSIS — R5383 Other fatigue: Secondary | ICD-10-CM | POA: Diagnosis not present

## 2017-09-18 DIAGNOSIS — K210 Gastro-esophageal reflux disease with esophagitis: Secondary | ICD-10-CM | POA: Diagnosis not present

## 2017-09-18 DIAGNOSIS — R7301 Impaired fasting glucose: Secondary | ICD-10-CM | POA: Diagnosis not present

## 2017-09-18 DIAGNOSIS — I25111 Atherosclerotic heart disease of native coronary artery with angina pectoris with documented spasm: Secondary | ICD-10-CM | POA: Diagnosis not present

## 2017-09-18 DIAGNOSIS — E782 Mixed hyperlipidemia: Secondary | ICD-10-CM | POA: Diagnosis not present

## 2017-09-18 DIAGNOSIS — E039 Hypothyroidism, unspecified: Secondary | ICD-10-CM | POA: Diagnosis not present

## 2017-09-18 DIAGNOSIS — N183 Chronic kidney disease, stage 3 (moderate): Secondary | ICD-10-CM | POA: Diagnosis not present

## 2017-09-18 DIAGNOSIS — I10 Essential (primary) hypertension: Secondary | ICD-10-CM | POA: Diagnosis not present

## 2017-09-20 ENCOUNTER — Telehealth: Payer: Self-pay | Admitting: Gastroenterology

## 2017-09-20 DIAGNOSIS — I25111 Atherosclerotic heart disease of native coronary artery with angina pectoris with documented spasm: Secondary | ICD-10-CM | POA: Diagnosis not present

## 2017-09-20 DIAGNOSIS — I10 Essential (primary) hypertension: Secondary | ICD-10-CM | POA: Diagnosis not present

## 2017-09-20 DIAGNOSIS — R0602 Shortness of breath: Secondary | ICD-10-CM | POA: Diagnosis not present

## 2017-09-20 DIAGNOSIS — E039 Hypothyroidism, unspecified: Secondary | ICD-10-CM | POA: Diagnosis not present

## 2017-09-20 DIAGNOSIS — Z6832 Body mass index (BMI) 32.0-32.9, adult: Secondary | ICD-10-CM | POA: Diagnosis not present

## 2017-09-20 DIAGNOSIS — E782 Mixed hyperlipidemia: Secondary | ICD-10-CM | POA: Diagnosis not present

## 2017-09-20 DIAGNOSIS — S7002XA Contusion of left hip, initial encounter: Secondary | ICD-10-CM | POA: Diagnosis not present

## 2017-09-20 DIAGNOSIS — M25512 Pain in left shoulder: Secondary | ICD-10-CM | POA: Diagnosis not present

## 2017-09-20 NOTE — Telephone Encounter (Signed)
The pt has been advised that the CT has not been reviewed by Janett Billow and she is out the rest of the week.  The report has been sent to her PCP whom she is seeing tomorrow.  We will call her as soon as Janett Billow reviews

## 2017-09-28 DIAGNOSIS — G4733 Obstructive sleep apnea (adult) (pediatric): Secondary | ICD-10-CM | POA: Diagnosis not present

## 2017-09-28 DIAGNOSIS — J449 Chronic obstructive pulmonary disease, unspecified: Secondary | ICD-10-CM | POA: Diagnosis not present

## 2017-10-04 DIAGNOSIS — G4733 Obstructive sleep apnea (adult) (pediatric): Secondary | ICD-10-CM | POA: Diagnosis not present

## 2017-10-04 DIAGNOSIS — J449 Chronic obstructive pulmonary disease, unspecified: Secondary | ICD-10-CM | POA: Diagnosis not present

## 2017-10-28 DIAGNOSIS — J449 Chronic obstructive pulmonary disease, unspecified: Secondary | ICD-10-CM | POA: Diagnosis not present

## 2017-10-28 DIAGNOSIS — G4733 Obstructive sleep apnea (adult) (pediatric): Secondary | ICD-10-CM | POA: Diagnosis not present

## 2017-11-03 DIAGNOSIS — G4733 Obstructive sleep apnea (adult) (pediatric): Secondary | ICD-10-CM | POA: Diagnosis not present

## 2017-11-03 DIAGNOSIS — J449 Chronic obstructive pulmonary disease, unspecified: Secondary | ICD-10-CM | POA: Diagnosis not present

## 2017-11-13 DIAGNOSIS — M4120 Other idiopathic scoliosis, site unspecified: Secondary | ICD-10-CM | POA: Diagnosis not present

## 2017-11-13 DIAGNOSIS — M5416 Radiculopathy, lumbar region: Secondary | ICD-10-CM | POA: Diagnosis not present

## 2017-11-13 DIAGNOSIS — M7138 Other bursal cyst, other site: Secondary | ICD-10-CM | POA: Diagnosis not present

## 2017-11-13 DIAGNOSIS — M545 Low back pain: Secondary | ICD-10-CM | POA: Diagnosis not present

## 2017-11-16 DIAGNOSIS — M5416 Radiculopathy, lumbar region: Secondary | ICD-10-CM | POA: Diagnosis not present

## 2017-11-21 DIAGNOSIS — M48061 Spinal stenosis, lumbar region without neurogenic claudication: Secondary | ICD-10-CM | POA: Diagnosis not present

## 2017-11-21 DIAGNOSIS — M5126 Other intervertebral disc displacement, lumbar region: Secondary | ICD-10-CM | POA: Diagnosis not present

## 2017-11-21 DIAGNOSIS — M5416 Radiculopathy, lumbar region: Secondary | ICD-10-CM | POA: Diagnosis not present

## 2017-11-23 ENCOUNTER — Telehealth: Payer: Self-pay | Admitting: Gastroenterology

## 2017-11-23 NOTE — Telephone Encounter (Signed)
Left message on machine to call back  

## 2017-11-23 NOTE — Telephone Encounter (Signed)
Patient returning phone call 

## 2017-11-24 MED ORDER — CIPROFLOXACIN HCL 500 MG PO TABS: 500.0000 mg | ORAL_TABLET | Freq: Two times a day (BID) | ORAL | 0 refills | Status: AC

## 2017-11-24 MED ORDER — METRONIDAZOLE 500 MG PO TABS: 500.0000 mg | ORAL_TABLET | Freq: Three times a day (TID) | ORAL | 0 refills | Status: AC

## 2017-11-24 NOTE — Telephone Encounter (Signed)
The pt has been advised of the information and verbalized understanding.   The prescriptions were sent to the pharmacy and she will call in 1 week to update

## 2017-11-24 NOTE — Telephone Encounter (Signed)
She complains of abdominal pain frequently that she reports as feeling like it is diverticulitis, but has had at least 4 CT scans over the past handful of years that never showed actual diverticulitis.  That being said, she does in fact have diverticulosis so it could be diverticulitis this time.  Go ahead and treat with cipro 500 mg BID and flagyl 500 mg TID for 7 days.  She can call with an update when complete.  Go to ED for worsening symptoms.  Thank you,  Jess

## 2017-11-24 NOTE — Telephone Encounter (Signed)
Lower left sided abd pain 8/10, occasionally right side.  Symptoms began yesterday has history of diverticulitis.  No fever, no diarrhea, no rectal bleeding, no nausea.  Ice or heat relieves the pain.  Stephanie Alvarado please advise.

## 2017-11-24 NOTE — Telephone Encounter (Signed)
Left message on machine to call back  

## 2017-11-28 DIAGNOSIS — J449 Chronic obstructive pulmonary disease, unspecified: Secondary | ICD-10-CM | POA: Diagnosis not present

## 2017-12-18 DIAGNOSIS — M545 Low back pain: Secondary | ICD-10-CM | POA: Diagnosis not present

## 2017-12-18 DIAGNOSIS — M7138 Other bursal cyst, other site: Secondary | ICD-10-CM | POA: Diagnosis not present

## 2017-12-18 DIAGNOSIS — M5416 Radiculopathy, lumbar region: Secondary | ICD-10-CM | POA: Diagnosis not present

## 2017-12-18 DIAGNOSIS — M4120 Other idiopathic scoliosis, site unspecified: Secondary | ICD-10-CM | POA: Diagnosis not present

## 2017-12-25 DIAGNOSIS — M5126 Other intervertebral disc displacement, lumbar region: Secondary | ICD-10-CM | POA: Diagnosis not present

## 2017-12-25 DIAGNOSIS — M5416 Radiculopathy, lumbar region: Secondary | ICD-10-CM | POA: Diagnosis not present

## 2017-12-28 DIAGNOSIS — J449 Chronic obstructive pulmonary disease, unspecified: Secondary | ICD-10-CM | POA: Diagnosis not present

## 2018-01-09 DIAGNOSIS — E782 Mixed hyperlipidemia: Secondary | ICD-10-CM | POA: Diagnosis not present

## 2018-01-09 DIAGNOSIS — I10 Essential (primary) hypertension: Secondary | ICD-10-CM | POA: Diagnosis not present

## 2018-01-09 DIAGNOSIS — K219 Gastro-esophageal reflux disease without esophagitis: Secondary | ICD-10-CM | POA: Diagnosis not present

## 2018-01-24 DIAGNOSIS — M79671 Pain in right foot: Secondary | ICD-10-CM | POA: Diagnosis not present

## 2018-01-24 DIAGNOSIS — M7671 Peroneal tendinitis, right leg: Secondary | ICD-10-CM | POA: Diagnosis not present

## 2018-01-24 DIAGNOSIS — M722 Plantar fascial fibromatosis: Secondary | ICD-10-CM | POA: Diagnosis not present

## 2018-01-24 DIAGNOSIS — M7661 Achilles tendinitis, right leg: Secondary | ICD-10-CM | POA: Diagnosis not present

## 2018-01-25 DIAGNOSIS — M5416 Radiculopathy, lumbar region: Secondary | ICD-10-CM | POA: Diagnosis not present

## 2018-01-28 DIAGNOSIS — J449 Chronic obstructive pulmonary disease, unspecified: Secondary | ICD-10-CM | POA: Diagnosis not present

## 2018-02-07 DIAGNOSIS — M7138 Other bursal cyst, other site: Secondary | ICD-10-CM | POA: Diagnosis not present

## 2018-02-07 DIAGNOSIS — M5416 Radiculopathy, lumbar region: Secondary | ICD-10-CM | POA: Diagnosis not present

## 2018-02-07 DIAGNOSIS — M5126 Other intervertebral disc displacement, lumbar region: Secondary | ICD-10-CM | POA: Diagnosis not present

## 2018-02-07 DIAGNOSIS — M4120 Other idiopathic scoliosis, site unspecified: Secondary | ICD-10-CM | POA: Diagnosis not present

## 2018-02-15 DIAGNOSIS — M7661 Achilles tendinitis, right leg: Secondary | ICD-10-CM | POA: Diagnosis not present

## 2018-02-15 DIAGNOSIS — R2689 Other abnormalities of gait and mobility: Secondary | ICD-10-CM | POA: Diagnosis not present

## 2018-02-15 DIAGNOSIS — M722 Plantar fascial fibromatosis: Secondary | ICD-10-CM | POA: Diagnosis not present

## 2018-02-26 DIAGNOSIS — M4120 Other idiopathic scoliosis, site unspecified: Secondary | ICD-10-CM | POA: Diagnosis not present

## 2018-02-26 DIAGNOSIS — M5126 Other intervertebral disc displacement, lumbar region: Secondary | ICD-10-CM | POA: Diagnosis not present

## 2018-02-26 DIAGNOSIS — M5416 Radiculopathy, lumbar region: Secondary | ICD-10-CM | POA: Diagnosis not present

## 2018-03-26 ENCOUNTER — Encounter (HOSPITAL_COMMUNITY): Payer: Self-pay | Admitting: Emergency Medicine

## 2018-03-26 ENCOUNTER — Emergency Department (HOSPITAL_COMMUNITY)
Admission: EM | Admit: 2018-03-26 | Discharge: 2018-03-26 | Disposition: A | Discharge status: Home / Self Care | Payer: PPO | Attending: Emergency Medicine | Admitting: Emergency Medicine

## 2018-03-26 DIAGNOSIS — E7800 Pure hypercholesterolemia, unspecified: Secondary | ICD-10-CM | POA: Diagnosis not present

## 2018-03-26 DIAGNOSIS — Z8673 Personal history of transient ischemic attack (TIA), and cerebral infarction without residual deficits: Secondary | ICD-10-CM | POA: Diagnosis not present

## 2018-03-26 DIAGNOSIS — S92001A Unspecified fracture of right calcaneus, initial encounter for closed fracture: Secondary | ICD-10-CM | POA: Diagnosis not present

## 2018-03-26 DIAGNOSIS — I5032 Chronic diastolic (congestive) heart failure: Secondary | ICD-10-CM | POA: Insufficient documentation

## 2018-03-26 DIAGNOSIS — M25571 Pain in right ankle and joints of right foot: Secondary | ICD-10-CM | POA: Insufficient documentation

## 2018-03-26 DIAGNOSIS — Z79899 Other long term (current) drug therapy: Secondary | ICD-10-CM | POA: Diagnosis not present

## 2018-03-26 DIAGNOSIS — I10 Essential (primary) hypertension: Secondary | ICD-10-CM | POA: Insufficient documentation

## 2018-03-26 DIAGNOSIS — Z87891 Personal history of nicotine dependence: Secondary | ICD-10-CM | POA: Diagnosis not present

## 2018-03-26 DIAGNOSIS — M79671 Pain in right foot: Secondary | ICD-10-CM | POA: Diagnosis not present

## 2018-03-26 DIAGNOSIS — I2510 Atherosclerotic heart disease of native coronary artery without angina pectoris: Secondary | ICD-10-CM | POA: Diagnosis not present

## 2018-03-26 DIAGNOSIS — E785 Hyperlipidemia, unspecified: Secondary | ICD-10-CM | POA: Insufficient documentation

## 2018-03-26 DIAGNOSIS — S86011A Strain of right Achilles tendon, initial encounter: Secondary | ICD-10-CM | POA: Diagnosis not present

## 2018-03-26 DIAGNOSIS — R609 Edema, unspecified: Secondary | ICD-10-CM | POA: Diagnosis not present

## 2018-03-26 DIAGNOSIS — Z7902 Long term (current) use of antithrombotics/antiplatelets: Secondary | ICD-10-CM | POA: Insufficient documentation

## 2018-03-26 NOTE — ED Provider Notes (Signed)
[facility] DEPT Provider Note   CSN: [id number] Arrival date & time: 03/26/18  1810     History   Chief Complaint Chief Complaint  Patient presents with  . Fall  . Foot Injury    HPI Stephanie Alvarado is a 77 y.o. female presents to the ER for MRI.  Patient states that she went to see her podiatrist Dr. Rosemary Holms earlier today who told her to come to the ER for an MRI of her foot.  Patient states that she has been dealing with Achilles tendinitis for almost 12 weeks.  She has had intermittent right heel pain, swelling, bruising.  Podiatrist has been treating this with injections and wraps.  Today, patient was carrying a box today when she tripped.  She had a sudden, severe pain to the right heel, she felt a snapping sensation.  Associated symptoms include mild swelling and bruising top right heel.  Her podiatrist examined her today and told her she needed an MRI.  Podiatrist obtained an x-ray in the office and told her that it looked like there was a chip off of the bone.  Reportedly, podiatrist "called in" an MRI of the foot and advised her to come to the ER.  Patient brings in a piece of paper from podiatrist for [facility]" to obtain MRI w/o contrast of R foot.  Pt has cam walker on.  Reports mild pain currently, worse with palpation and weight bearing. She denies distal leg paresthesias, numbness, redness. No calf pain.   HPI  Past Medical History:  Diagnosis Date  . Allergic rhinitis, cause unspecified   . Anemia, unspecified   . Anxiety state, unspecified   . Atherosclerosis of aorta ([place])    TEE, June, 2010, grade 4 aortic arch atherosclerosis , Dr. Aundra Dubin  . CAD (coronary artery disease)    DES and circumflex 2006 /  DES to LAD 2011  . Carotid artery disease ([place])    Doppler, November, 2011, stable, 0-39% bilateral, turbulent flow left subclavian  . Chronic diastolic heart failure ([place])   . Cyst of thyroid   . Diverticulosis of  colon (without mention of hemorrhage)   . Dysphasia    Possibly esophageal stricture  . Ejection fraction     EF 60%, Echo, November 12, 2008, /  EF 55%, TEE, November 17, 2008  . Esophageal reflux   . Hiatal hernia   . Hyperlipemia   . Hypertension   . Lumbago   . Lumbar disc disease   . MVP (mitral valve prolapse)   . Nonspecific abnormal finding in stool contents   . Obesity, unspecified   . Osteoarthrosis, unspecified whether generalized or localized, unspecified site   . Osteopenia   . Other diseases of lung, not elsewhere classified   . Palpitations    October, 2012  . Peripheral vascular disease, unspecified ([place])   . Personal history of colonic polyps    ADENOMATOUS POLYP  . PFO (patent foramen ovale)    Patient had a very small PFO by TEE 2010.  This was seen only by bubble analysis during Valsalva  . Shortness of breath   . Sleep apnea   . Sleep related hypoventilation/hypoxemia in conditions classifiable elsewhere   . Stroke [facility]) 11/2008   Treated with TPA? /     2010, TEE no left atrial clot, probably from atherosclerosis of the aortic arch  . 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 Victorino Dike.  Marland Kitchen  Unspecified cerebral artery occlusion with cerebral infarction   . Unspecified venous (peripheral) insufficiency     Patient Active Problem List   Diagnosis Date Noted  . Diarrhea 09/06/2017  . RLQ abdominal pain 05/26/2017  . Lightheadedness   . Ataxia 05/30/2016  . Dizziness 05/30/2016  . Preoperative clearance 11/10/2015  . Varicose veins of left lower extremity with complications 63/14/9702  . Varicose veins of leg with complications 63/78/5885  . Lumbar scoliosis 11/21/2013  . Preop cardiovascular exam 10/29/2013  . LLQ abdominal pain 04/05/2013  . Diverticulitis 04/05/2013  . Palpitations   . 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 PFO (patent foramen ovale)   . CAD (coronary artery disease)   . Ejection fraction   . Atherosclerosis of aorta ([place])     . Carotid artery disease ([place])   . 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 CONSTIPATION 06/11/2010  . ABDOMINAL PAIN-LLQ 06/11/2010  . History of colonic polyps 06/11/2010  . SLEEP RELATED HYPOVENTILATION/HYPOXEMIA CCE 07/17/2009  . Stroke ([place]) 11/11/2008  . COLONIC POLYPS 08/31/2008  . THYROID CYST 08/31/2008  . VENOUS INSUFFICIENCY 08/31/2008  . DIVERTICULOSIS OF COLON 08/31/2008  . LOW BACK PAIN, CHRONIC 08/31/2008  . 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 Dyspnea 08/08/2008  . HYPERCHOLESTEROLEMIA 04/19/2007  . OBESITY 04/19/2007  . ANEMIA 04/19/2007  . ANXIETY 04/19/2007  . Essential hypertension 04/19/2007  . ALLERGIC RHINITIS 04/19/2007  . PULMONARY NODULE, RIGHT LOWER LOBE 04/19/2007  . GERD 04/19/2007  . DEGENERATIVE JOINT DISEASE 04/19/2007    Past Surgical History:  Procedure Laterality Date  . BACK SURGERY    . cataracts     both eyes  . CHOLECYSTECTOMY    . CORONARY ANGIOPLASTY WITH STENT PLACEMENT  1990's  . LAPAROSCOPIC HYSTERECTOMY    . LUMBAR DISC SURGERY     X 3  . RIGHT/LEFT HEART CATH AND CORONARY ANGIOGRAPHY N/A 04/10/2017   Procedure: RIGHT/LEFT HEART CATH AND CORONARY ANGIOGRAPHY;  Surgeon: Belva Crome, MD;  Location: [facility] LAB;  Service: Cardiovascular;  Laterality: N/A;  . urethral suspension  1993   Dr. Nori Riis  . VARICOSE VEIN SURGERY     x 2     OB History   None      Home Medications    Prior to Admission medications   Medication Sig Start Date End Date Taking? Authorizing Provider  amLODipine (NORVASC) 5 MG tablet Take 5 mg by mouth daily.     Yes [provider]  buPROPion (WELLBUTRIN XL) 150 MG 24 hr tablet Take 150 mg by mouth 2 (two) times daily.  10/31/15  Yes [provider]  clopidogrel (PLAVIX) 75 MG tablet Take 1 tablet (75 mg total) by mouth daily. 06/03/13  Yes Carlena Bjornstad, MD  furosemide (LASIX) 40 MG tablet Take 40-80 mg by mouth daily.    Yes [provider]  gabapentin (NEURONTIN)  300 MG capsule Take 600 mg by mouth 3 (three) times daily.  10/31/15  Yes [provider]  isosorbide mononitrate (IMDUR) 30 MG 24 hr tablet Take 30 mg by mouth daily.   Yes [provider]  Melatonin 10 MG TABS Take 10 mg by mouth at bedtime.   Yes [provider]  metoprolol tartrate (LOPRESSOR) 50 MG tablet Take 25 mg by mouth 2 (two) times daily.    Yes [provider]  pantoprazole (PROTONIX) 40 MG tablet Take 40 mg by mouth 2 (two) times daily.    Yes [provider]  polyethylene glycol  powder (GLYCOLAX/MIRALAX) powder Take 17 g by mouth daily as needed for moderate constipation.    Yes Sable Feil, MD  rosuvastatin (CRESTOR) 10 MG tablet Take 10 mg by mouth at bedtime.   Yes [provider]  metoprolol tartrate (LOPRESSOR) 25 MG tablet Take 1 tablet (25 mg total) by mouth 2 (two) times daily. Patient not taking: Reported on 03/26/2018 06/01/16   Steve Rattler, DO  nitroGLYCERIN (NITROSTAT) 0.4 MG SL tablet Place 0.4 mg under the tongue every 5 (five) minutes as needed for chest pain (TAKE UP TO 3 DOSES BEFORE CALLING 911).    [provider]    Family History Family History  Problem Relation Age of Onset  . Heart disease Father   . Heart attack Father   . Alzheimer's disease Mother   . Colon cancer Paternal Aunt   . Breast cancer Unknown        cousions  . Esophageal cancer Neg Hx   . Rectal cancer Neg Hx   . Stomach cancer Neg Hx     Social History Social History   Tobacco Use  . Smoking status: Former Smoker    Packs/day: 1.00    Years: 40.00    Pack years: 40.00    Types: Cigarettes    Last attempt to quit: 06/13/1998    Years since quitting: 19.8  . Smokeless tobacco: Never Used  Substance Use Topics  . Alcohol use: No    Alcohol/week: 0.0 standard drinks  . Drug use: No     Allergies   Codeine and Lipitor [atorvastatin]   Review of Systems Review of Systems  Musculoskeletal: Positive  for arthralgias, gait problem and joint swelling.  Skin: Positive for color change.  All other systems reviewed and are negative.    Physical Exam Updated Vital Signs BP 127/61   Pulse (!) 56   Temp (!) 97.5 F (36.4 C) (Oral)   Resp 15   Ht 5\' 7"  (1.702 m)   Wt 88.5 kg   SpO2 94%   BMI 30.54 kg/m   Physical Exam  Constitutional: She is oriented to person, place, and time. She appears well-developed and well-nourished. No distress.  NAD.  HENT:  Head: Normocephalic and atraumatic.  Right Ear: External ear normal.  Left Ear: External ear normal.  Nose: Nose normal.  Eyes: Conjunctivae and EOM are normal. No scleral icterus.  Neck: Normal range of motion. Neck supple.  Cardiovascular: Normal rate, regular rhythm and normal heart sounds.  No lower extremity or pitting edema.  No proximal calf or popliteal space tenderness.  1+ radial and DP pulses bilaterally.  Pulmonary/Chest: Effort normal and breath sounds normal.  Musculoskeletal: Normal range of motion. She exhibits tenderness. She exhibits no deformity.  Right foot: Mild edema, ecchymosis to the right heel and along Achilles tendon.  No obvious indentation or defect along the Achilles tendon.  No asymmetric calf edema or proximal calf/popliteal space tenderness.  Full passive R OM of the right ankle without significant pain.  5/5 strength with dorsiflexion and plantarflexion, abduction and abduction against resistance.  No focal bony tenderness to malleoli.  Neurological: She is alert and oriented to person, place, and time.  Sensation to light touch intact in the right foot.  Skin: Skin is warm and dry. Capillary refill takes less than 2 seconds.  Psychiatric: She has a normal mood and affect. Her behavior is normal. Judgment and thought content normal.  Nursing note and vitals reviewed.    ED  Treatments / Results  Labs (all labs ordered are listed, but only abnormal results are displayed) Labs Reviewed - No data to  display  EKG None  Radiology No results found.  Procedures Procedures (including critical care time)  Medications Ordered in ED Medications - No data to display   Initial Impression / Assessment and Plan / ED Course  I have reviewed the triage vital signs and the nursing notes.  Pertinent labs & imaging results that were available during my care of the patient were reviewed by me and considered in my medical decision making (see chart for details).    77 year old reports her podiatrist referred her to the ER for MRI of the right foot.  States she had x-ray of her foot earlier today by podiatry that looked suspicion for bone avulsion.    Exam does suggest possible Achilles rupture.  Extremity is otherwise NVI.  Full painless R OM of the ankle.  Considered DVT to be less likely.  No signs of cellulitis.  She has no focal bony tenderness to the joint and septic arthritis is unlikely in this clinical setting.    I attempted to contact her podiatrist/clinic 3 times to clarify need for imaging however unable to contact anyone.  Secretary also attempted to contact podiatrist without success.  Unfortunately, at this time MRI is not available in the ER.  I explained to patient that the cost of MRI obtained in the ER would be a lot higher than if she gets this ordered as outpatient.  Podiatrist may have ordered MRI to [facility] since patient has a piece of paper with this written on it.  I will discharge patient with cam walker, high-dose NSAIDs, rest, elevation, ice.  I encouraged her to contact her podiatrist early tomorrow morning to clarify need for MRI.  Patient verbalized understanding.  She was frustrated about her weight time and inability to obtain MRI however she understood that this can be obtained as outpatient likely tomorrow.  Discussed return precautions.  Patient and daughter at bedside are in agreement.  Final Clinical Impressions(s) / ED Diagnoses   Final diagnoses:    Acute right ankle pain    ED Discharge Orders    None       Kinnie Feil, PA-C 03/27/18 Abram Sander, MD 04/06/18 [phone]

## 2018-03-26 NOTE — ED Notes (Signed)
Pt and family verbalized that they were told by a nurse they would need a CT scan and were upset they have been here all night and we are not going to do anything. This nurse explained that her doctor sent her here for an MRI, and like PA Rosemarie Ax said we do not have MRI available here right now. This nurse apologized for any confusion that was spoken with a previous staff member but will try and get them home as soon as we can.

## 2018-03-26 NOTE — Discharge Instructions (Addendum)
You were referred to the ER by your podiatrist for an MRI.  Unfortunately, MRI was not available tonight.  Your symptoms do suggest a an injury to your Achilles tendon.  We were unable to contact your podiatrist for further instruction.  Call your podiatrist in the morning and clarify need for imaging.  Return to the ER for worsening swelling, bruising, calf pain, loss of sensation, weakness to your extremity, chest pain, shortness of breath.  Elevate your foot. Take [phone] mg acetaminophen every 6-8 hours  for pain. Can add 600 mg ibuprofen every 6-8 hours for more pain control.

## 2018-03-26 NOTE — ED Notes (Signed)
PA Claudia at beside

## 2018-03-26 NOTE — ED Notes (Signed)
When getting pts discharged paperwork, saw family wheeling pt out of department. States they just want to go home.

## 2018-03-26 NOTE — ED Triage Notes (Signed)
Pt BIB POV. Pt went to her podiatrist for a R foot issue. Pt has had an issue with her achilles for months and today she fell and heard a snap and felt pain. Pt is unable to ambulate without a boot. Pts podiatrist sent her here for a possible achilles rupture and fracture. Denies head injury.

## 2018-03-26 NOTE — ED Notes (Signed)
This nurse assisted pt to the restroom via wheelchair with no difficulty. Pt family requesting to leave. This nurse explained I will follow up with PA if she was able to get in touch with the doctor.

## 2018-03-27 ENCOUNTER — Ambulatory Visit
Admission: RE | Admit: 2018-03-27 | Discharge: 2018-03-27 | Disposition: A | Discharge status: Home / Self Care | Payer: PPO | Source: Ambulatory Visit | Attending: Podiatry | Admitting: Podiatry

## 2018-03-27 ENCOUNTER — Other Ambulatory Visit: Payer: Self-pay | Admitting: Podiatry

## 2018-03-27 DIAGNOSIS — M7661 Achilles tendinitis, right leg: Secondary | ICD-10-CM

## 2018-03-27 DIAGNOSIS — S86011A Strain of right Achilles tendon, initial encounter: Secondary | ICD-10-CM | POA: Diagnosis not present

## 2018-03-28 ENCOUNTER — Ambulatory Visit: Payer: PPO | Admitting: Podiatry

## 2018-04-02 ENCOUNTER — Telehealth: Payer: Self-pay | Admitting: *Deleted

## 2018-04-02 ENCOUNTER — Other Ambulatory Visit (HOSPITAL_COMMUNITY): Payer: Self-pay | Admitting: Orthopedic Surgery

## 2018-04-02 ENCOUNTER — Encounter (HOSPITAL_BASED_OUTPATIENT_CLINIC_OR_DEPARTMENT_OTHER): Payer: Self-pay | Admitting: *Deleted

## 2018-04-02 DIAGNOSIS — M66861 Spontaneous rupture of other tendons, right lower leg: Secondary | ICD-10-CM | POA: Diagnosis not present

## 2018-04-02 NOTE — Telephone Encounter (Signed)
   Primary Cardiologist: Sherren Mocha, MD  Chart reviewed as part of pre-operative protocol coverage. Patient was contacted 04/02/2018 in reference to pre-operative risk assessment for pending surgery as outlined below.  Stephanie Alvarado was last seen 03/2017 by Dr. Burt Knack. She has long h/o CAD as outlined in chart with prior PCIs, last in 2009, also carotid dz, chronic diastolic CHF, HTN, HLD, stroke, PFO, subclavian disease. Last cath 03/2017 done for dyspnea showed high-grade coronary obstruction or abnormal hemodynamics to explain the severity of dyspnea, distal margin of the LAD stent is bordering on significant, needs to be followed over time for progression. EF was 50% with normal LV hemodynamics. In 05/2017 Dr. Burt Knack had cleared patient to come off Plavix x 5 days for different orthopedic procedure and patient did well.  I spoke with patient personally who confirms she is doing fine from a cardiac standpoint without any new chest pain, dyspnea, palpitations or syncope. Therefore, based on ACC/AHA guidelines, the patient would be at acceptable risk for the planned procedure without further cardiovascular testing.   I spoke with Dr. Burt Knack given somewhat unusual request to hold 3 days for procedure. He feels comfortable that the patient could hold for 5 days prior to surgery if bleeding risk is a concern. Given timing of upcoming surgery in 3 days I will forward to our callback nurse to a) notify surgeon's office by phone of these recommendations and b) also update patient as well. Will also route message via epic fax function.  Charlie Pitter, PA-C 04/02/2018, 3:53 PM

## 2018-04-02 NOTE — Telephone Encounter (Signed)
Follow up   Pt is calling to follow up on this clearance.

## 2018-04-02 NOTE — Telephone Encounter (Signed)
Left a message for [facility] to return the call re: amt of time they are requesting to hold Plavix. See note below

## 2018-04-02 NOTE — Telephone Encounter (Signed)
Follow up      Stephanie Alvarado is returning call from [facility]. She said she spoke with Dr. Doran Durand and he would typically ask to hold plavix for 3 days. She said she confirmed that with him. She asked for a call back.

## 2018-04-02 NOTE — Telephone Encounter (Signed)
   [facility] Pre-operative Risk Assessment    Request for surgical clearance:  1. What type of surgery is being performed?  Right ankle: Right Achille tendon reconstruction and gastroc recession   2. When is this surgery scheduled? 04/05/18   3. What type of clearance is required (medical clearance vs. Pharmacy clearance to hold med vs. Both)? medical  4. Are there any medications that need to be held prior to surgery and how long? Takes Plavix - not noted on clearance request to hold.  I called the surgery coordinator Glendale Chard) and left VM asking if Dr. Doran Durand will want to hold it and if so for how long.  5. Practice name and name of physician performing surgery? [facility], Dr. Doran Durand   6. What is your office phone number [phone]    7.   What is your office fax number [phone]  8.   Anesthesia type (None, local, MAC, general) ? General   Stephanie Alvarado, Stephanie Alvarado 04/02/2018, 1:22 PM  _________________________________________________________________   (provider comments below)

## 2018-04-02 NOTE — Telephone Encounter (Signed)
Follow up    Stephanie Alvarado with Dr. Doran Durand office is calling back in reference to holding the Plavix. She states that Dr. Doran Durand told the patient to hold the Plavix starting today for 3 days.

## 2018-04-02 NOTE — Telephone Encounter (Signed)
Spoke with [employer] from [facility] [employer].  She states that they use a turnique for the procedure and bleeding wasn't a risk the surgeon was concerned about.  She advised that the pt actually want have it for 4 days, as pt didn't take it today as well, so they feel everything will be fine.

## 2018-04-02 NOTE — Progress Notes (Signed)
Left message for Claiborne Billings at Dr. Nona Dell office,will need to have cardiac clearance for procedure.

## 2018-04-03 ENCOUNTER — Other Ambulatory Visit: Payer: Self-pay

## 2018-04-03 ENCOUNTER — Encounter (HOSPITAL_BASED_OUTPATIENT_CLINIC_OR_DEPARTMENT_OTHER)
Admission: RE | Admit: 2018-04-03 | Discharge: 2018-04-03 | Disposition: A | Discharge status: Home / Self Care | Payer: PPO | Source: Ambulatory Visit | Attending: Orthopedic Surgery | Admitting: Orthopedic Surgery

## 2018-04-03 ENCOUNTER — Encounter (HOSPITAL_BASED_OUTPATIENT_CLINIC_OR_DEPARTMENT_OTHER): Payer: Self-pay | Admitting: *Deleted

## 2018-04-03 DIAGNOSIS — I110 Hypertensive heart disease with heart failure: Secondary | ICD-10-CM | POA: Diagnosis not present

## 2018-04-03 DIAGNOSIS — K449 Diaphragmatic hernia without obstruction or gangrene: Secondary | ICD-10-CM | POA: Diagnosis not present

## 2018-04-03 DIAGNOSIS — M1990 Unspecified osteoarthritis, unspecified site: Secondary | ICD-10-CM | POA: Diagnosis not present

## 2018-04-03 DIAGNOSIS — M7661 Achilles tendinitis, right leg: Secondary | ICD-10-CM | POA: Diagnosis not present

## 2018-04-03 DIAGNOSIS — Z6832 Body mass index (BMI) 32.0-32.9, adult: Secondary | ICD-10-CM | POA: Diagnosis not present

## 2018-04-03 DIAGNOSIS — R001 Bradycardia, unspecified: Secondary | ICD-10-CM | POA: Insufficient documentation

## 2018-04-03 DIAGNOSIS — R1310 Dysphagia, unspecified: Secondary | ICD-10-CM | POA: Diagnosis not present

## 2018-04-03 DIAGNOSIS — R002 Palpitations: Secondary | ICD-10-CM | POA: Diagnosis not present

## 2018-04-03 DIAGNOSIS — I2510 Atherosclerotic heart disease of native coronary artery without angina pectoris: Secondary | ICD-10-CM

## 2018-04-03 DIAGNOSIS — I5032 Chronic diastolic (congestive) heart failure: Secondary | ICD-10-CM | POA: Diagnosis not present

## 2018-04-03 DIAGNOSIS — E785 Hyperlipidemia, unspecified: Secondary | ICD-10-CM | POA: Diagnosis not present

## 2018-04-03 DIAGNOSIS — K5790 Diverticulosis of intestine, part unspecified, without perforation or abscess without bleeding: Secondary | ICD-10-CM | POA: Diagnosis not present

## 2018-04-03 DIAGNOSIS — Z01810 Encounter for preprocedural cardiovascular examination: Secondary | ICD-10-CM | POA: Insufficient documentation

## 2018-04-03 DIAGNOSIS — M6701 Short Achilles tendon (acquired), right ankle: Secondary | ICD-10-CM | POA: Diagnosis not present

## 2018-04-03 DIAGNOSIS — E041 Nontoxic single thyroid nodule: Secondary | ICD-10-CM | POA: Diagnosis not present

## 2018-04-03 DIAGNOSIS — I10 Essential (primary) hypertension: Secondary | ICD-10-CM | POA: Insufficient documentation

## 2018-04-03 DIAGNOSIS — X58XXXA Exposure to other specified factors, initial encounter: Secondary | ICD-10-CM | POA: Diagnosis not present

## 2018-04-03 DIAGNOSIS — I341 Nonrheumatic mitral (valve) prolapse: Secondary | ICD-10-CM | POA: Diagnosis not present

## 2018-04-03 DIAGNOSIS — Y939 Activity, unspecified: Secondary | ICD-10-CM | POA: Diagnosis not present

## 2018-04-03 DIAGNOSIS — K219 Gastro-esophageal reflux disease without esophagitis: Secondary | ICD-10-CM | POA: Diagnosis not present

## 2018-04-03 DIAGNOSIS — F419 Anxiety disorder, unspecified: Secondary | ICD-10-CM | POA: Diagnosis not present

## 2018-04-03 DIAGNOSIS — M8580 Other specified disorders of bone density and structure, unspecified site: Secondary | ICD-10-CM | POA: Diagnosis not present

## 2018-04-03 DIAGNOSIS — I700 Atherosclerosis of aorta: Secondary | ICD-10-CM | POA: Diagnosis not present

## 2018-04-03 DIAGNOSIS — E669 Obesity, unspecified: Secondary | ICD-10-CM | POA: Diagnosis not present

## 2018-04-03 DIAGNOSIS — J309 Allergic rhinitis, unspecified: Secondary | ICD-10-CM | POA: Diagnosis not present

## 2018-04-03 DIAGNOSIS — S86091A Other specified injury of right Achilles tendon, initial encounter: Secondary | ICD-10-CM | POA: Diagnosis not present

## 2018-04-03 DIAGNOSIS — M9261 Juvenile osteochondrosis of tarsus, right ankle: Secondary | ICD-10-CM | POA: Diagnosis not present

## 2018-04-03 DIAGNOSIS — M545 Low back pain: Secondary | ICD-10-CM | POA: Diagnosis not present

## 2018-04-03 DIAGNOSIS — G4730 Sleep apnea, unspecified: Secondary | ICD-10-CM | POA: Diagnosis not present

## 2018-04-03 LAB — BASIC METABOLIC PANEL
ANION GAP: 7 (ref 5–15)
BUN: 12 mg/dL (ref 8–23)
CO2: 30 mmol/L (ref 22–32)
Calcium: 10 mg/dL (ref 8.9–10.3)
Chloride: 104 mmol/L (ref 98–111)
Creatinine, Ser: 1.02 mg/dL — ABNORMAL HIGH (ref 0.44–1.00)
GFR, EST AFRICAN AMERICAN: 59 mL/min — AB (ref >60)
GFR, EST NON AFRICAN AMERICAN: 51 mL/min — AB (ref >60)
Glucose, Bld: 100 mg/dL — ABNORMAL HIGH (ref 70–99)
POTASSIUM: 4.7 mmol/L (ref 3.5–5.1)
SODIUM: 141 mmol/L (ref 135–145)

## 2018-04-03 NOTE — Progress Notes (Signed)
Patient's LD of Plavix was 04-01-18. Cardiac clearance notes on chart. She will come in for EKG, BMP.

## 2018-04-04 NOTE — Progress Notes (Signed)
Reviewed with Dr Gifford Shave. [place] for [facility]

## 2018-04-05 ENCOUNTER — Other Ambulatory Visit: Payer: Self-pay

## 2018-04-05 ENCOUNTER — Ambulatory Visit (HOSPITAL_BASED_OUTPATIENT_CLINIC_OR_DEPARTMENT_OTHER): Payer: PPO | Admitting: Anesthesiology

## 2018-04-05 ENCOUNTER — Ambulatory Visit (HOSPITAL_BASED_OUTPATIENT_CLINIC_OR_DEPARTMENT_OTHER)
Admission: RE | Admit: 2018-04-05 | Discharge: 2018-04-05 | Disposition: A | Discharge status: Home / Self Care | Payer: PPO | Source: Ambulatory Visit | Attending: Orthopedic Surgery | Admitting: Orthopedic Surgery

## 2018-04-05 ENCOUNTER — Encounter (HOSPITAL_BASED_OUTPATIENT_CLINIC_OR_DEPARTMENT_OTHER)
Admission: RE | Disposition: A | Discharge status: Home / Self Care | Payer: Self-pay | Source: Ambulatory Visit | Attending: Orthopedic Surgery

## 2018-04-05 ENCOUNTER — Encounter (HOSPITAL_BASED_OUTPATIENT_CLINIC_OR_DEPARTMENT_OTHER): Payer: Self-pay | Admitting: Certified Registered"

## 2018-04-05 DIAGNOSIS — Z888 Allergy status to other drugs, medicaments and biological substances status: Secondary | ICD-10-CM | POA: Insufficient documentation

## 2018-04-05 DIAGNOSIS — F419 Anxiety disorder, unspecified: Secondary | ICD-10-CM | POA: Diagnosis not present

## 2018-04-05 DIAGNOSIS — Y939 Activity, unspecified: Secondary | ICD-10-CM | POA: Insufficient documentation

## 2018-04-05 DIAGNOSIS — J309 Allergic rhinitis, unspecified: Secondary | ICD-10-CM | POA: Insufficient documentation

## 2018-04-05 DIAGNOSIS — Z9049 Acquired absence of other specified parts of digestive tract: Secondary | ICD-10-CM | POA: Insufficient documentation

## 2018-04-05 DIAGNOSIS — E785 Hyperlipidemia, unspecified: Secondary | ICD-10-CM | POA: Insufficient documentation

## 2018-04-05 DIAGNOSIS — I341 Nonrheumatic mitral (valve) prolapse: Secondary | ICD-10-CM | POA: Insufficient documentation

## 2018-04-05 DIAGNOSIS — K449 Diaphragmatic hernia without obstruction or gangrene: Secondary | ICD-10-CM | POA: Insufficient documentation

## 2018-04-05 DIAGNOSIS — S86091A Other specified injury of right Achilles tendon, initial encounter: Secondary | ICD-10-CM | POA: Insufficient documentation

## 2018-04-05 DIAGNOSIS — X58XXXA Exposure to other specified factors, initial encounter: Secondary | ICD-10-CM | POA: Insufficient documentation

## 2018-04-05 DIAGNOSIS — I110 Hypertensive heart disease with heart failure: Secondary | ICD-10-CM | POA: Diagnosis not present

## 2018-04-05 DIAGNOSIS — M9261 Juvenile osteochondrosis of tarsus, right ankle: Secondary | ICD-10-CM | POA: Insufficient documentation

## 2018-04-05 DIAGNOSIS — Z87891 Personal history of nicotine dependence: Secondary | ICD-10-CM | POA: Insufficient documentation

## 2018-04-05 DIAGNOSIS — Z800 Family history of malignant neoplasm of digestive organs: Secondary | ICD-10-CM | POA: Insufficient documentation

## 2018-04-05 DIAGNOSIS — I2510 Atherosclerotic heart disease of native coronary artery without angina pectoris: Secondary | ICD-10-CM | POA: Insufficient documentation

## 2018-04-05 DIAGNOSIS — K219 Gastro-esophageal reflux disease without esophagitis: Secondary | ICD-10-CM | POA: Insufficient documentation

## 2018-04-05 DIAGNOSIS — M6701 Short Achilles tendon (acquired), right ankle: Secondary | ICD-10-CM | POA: Insufficient documentation

## 2018-04-05 DIAGNOSIS — G8918 Other acute postprocedural pain: Secondary | ICD-10-CM | POA: Diagnosis not present

## 2018-04-05 DIAGNOSIS — M7661 Achilles tendinitis, right leg: Secondary | ICD-10-CM | POA: Insufficient documentation

## 2018-04-05 DIAGNOSIS — K5790 Diverticulosis of intestine, part unspecified, without perforation or abscess without bleeding: Secondary | ICD-10-CM | POA: Diagnosis not present

## 2018-04-05 DIAGNOSIS — M545 Low back pain: Secondary | ICD-10-CM | POA: Insufficient documentation

## 2018-04-05 DIAGNOSIS — R1310 Dysphagia, unspecified: Secondary | ICD-10-CM | POA: Insufficient documentation

## 2018-04-05 DIAGNOSIS — Z820 Family history of epilepsy and other diseases of the nervous system: Secondary | ICD-10-CM | POA: Insufficient documentation

## 2018-04-05 DIAGNOSIS — E041 Nontoxic single thyroid nodule: Secondary | ICD-10-CM | POA: Diagnosis not present

## 2018-04-05 DIAGNOSIS — I700 Atherosclerosis of aorta: Secondary | ICD-10-CM | POA: Insufficient documentation

## 2018-04-05 DIAGNOSIS — I5032 Chronic diastolic (congestive) heart failure: Secondary | ICD-10-CM | POA: Diagnosis not present

## 2018-04-05 DIAGNOSIS — M66861 Spontaneous rupture of other tendons, right lower leg: Secondary | ICD-10-CM | POA: Diagnosis not present

## 2018-04-05 DIAGNOSIS — E669 Obesity, unspecified: Secondary | ICD-10-CM | POA: Insufficient documentation

## 2018-04-05 DIAGNOSIS — E1151 Type 2 diabetes mellitus with diabetic peripheral angiopathy without gangrene: Secondary | ICD-10-CM | POA: Insufficient documentation

## 2018-04-05 DIAGNOSIS — M1990 Unspecified osteoarthritis, unspecified site: Secondary | ICD-10-CM | POA: Insufficient documentation

## 2018-04-05 DIAGNOSIS — Z803 Family history of malignant neoplasm of breast: Secondary | ICD-10-CM | POA: Insufficient documentation

## 2018-04-05 DIAGNOSIS — Q211 Atrial septal defect: Secondary | ICD-10-CM | POA: Insufficient documentation

## 2018-04-05 DIAGNOSIS — Z6832 Body mass index (BMI) 32.0-32.9, adult: Secondary | ICD-10-CM | POA: Insufficient documentation

## 2018-04-05 DIAGNOSIS — Z8673 Personal history of transient ischemic attack (TIA), and cerebral infarction without residual deficits: Secondary | ICD-10-CM | POA: Insufficient documentation

## 2018-04-05 DIAGNOSIS — M216X1 Other acquired deformities of right foot: Secondary | ICD-10-CM | POA: Diagnosis not present

## 2018-04-05 DIAGNOSIS — Z885 Allergy status to narcotic agent status: Secondary | ICD-10-CM | POA: Insufficient documentation

## 2018-04-05 DIAGNOSIS — R002 Palpitations: Secondary | ICD-10-CM | POA: Insufficient documentation

## 2018-04-05 DIAGNOSIS — I739 Peripheral vascular disease, unspecified: Secondary | ICD-10-CM | POA: Insufficient documentation

## 2018-04-05 DIAGNOSIS — Z79899 Other long term (current) drug therapy: Secondary | ICD-10-CM | POA: Insufficient documentation

## 2018-04-05 DIAGNOSIS — G4730 Sleep apnea, unspecified: Secondary | ICD-10-CM | POA: Insufficient documentation

## 2018-04-05 DIAGNOSIS — Z955 Presence of coronary angioplasty implant and graft: Secondary | ICD-10-CM | POA: Insufficient documentation

## 2018-04-05 DIAGNOSIS — Z86010 Personal history of colonic polyps: Secondary | ICD-10-CM | POA: Insufficient documentation

## 2018-04-05 DIAGNOSIS — S86001A Unspecified injury of right Achilles tendon, initial encounter: Secondary | ICD-10-CM | POA: Diagnosis not present

## 2018-04-05 DIAGNOSIS — Z8249 Family history of ischemic heart disease and other diseases of the circulatory system: Secondary | ICD-10-CM | POA: Insufficient documentation

## 2018-04-05 DIAGNOSIS — M7731 Calcaneal spur, right foot: Secondary | ICD-10-CM | POA: Diagnosis not present

## 2018-04-05 DIAGNOSIS — M8580 Other specified disorders of bone density and structure, unspecified site: Secondary | ICD-10-CM | POA: Insufficient documentation

## 2018-04-05 HISTORY — DX: Strain of right Achilles tendon, initial encounter: S86.011A

## 2018-04-05 HISTORY — PX: ACHILLES TENDON SURGERY: SHX542

## 2018-04-05 HISTORY — PX: GASTROCNEMIUS RECESSION: SHX863

## 2018-04-05 SURGERY — REPAIR, TENDON, ACHILLES
Anesthesia: General | Site: Leg Lower | Laterality: Right

## 2018-04-05 MED ORDER — ROPIVACAINE HCL 7.5 MG/ML IJ SOLN
INTRAMUSCULAR | Status: DC | PRN
  Administered 2018-04-05: 25 mL via PERINEURAL

## 2018-04-05 MED ORDER — LACTATED RINGERS IV SOLN
INTRAVENOUS | Status: DC
  Administered 2018-04-05 (×2): via INTRAVENOUS

## 2018-04-05 MED ORDER — FENTANYL CITRATE (PF) 100 MCG/2ML IJ SOLN
INTRAMUSCULAR | Status: AC
  Filled 2018-04-05: qty 2

## 2018-04-05 MED ORDER — CHLORHEXIDINE GLUCONATE 4 % EX LIQD: 60.0000 mL | Freq: Once | CUTANEOUS | Status: DC

## 2018-04-05 MED ORDER — EPHEDRINE SULFATE 50 MG/ML IJ SOLN
INTRAMUSCULAR | Status: DC | PRN
  Administered 2018-04-05 (×4): 10 mg via INTRAVENOUS

## 2018-04-05 MED ORDER — SCOPOLAMINE 1 MG/3DAYS TD PT72: 1.0000 | MEDICATED_PATCH | Freq: Once | TRANSDERMAL | Status: DC | PRN

## 2018-04-05 MED ORDER — ONDANSETRON HCL 4 MG/2ML IJ SOLN
INTRAMUSCULAR | Status: DC | PRN
  Administered 2018-04-05: 4 mg via INTRAVENOUS

## 2018-04-05 MED ORDER — PHENYLEPHRINE HCL 10 MG/ML IJ SOLN
INTRAMUSCULAR | Status: DC | PRN
  Administered 2018-04-05 (×2): 40 ug via INTRAVENOUS

## 2018-04-05 MED ORDER — MEPERIDINE HCL 25 MG/ML IJ SOLN: 6.2500 mg | INTRAMUSCULAR | Status: DC | PRN

## 2018-04-05 MED ORDER — MIDAZOLAM HCL 2 MG/2ML IJ SOLN: 1.0000 mg | INTRAMUSCULAR | Status: DC | PRN

## 2018-04-05 MED ORDER — LACTATED RINGERS IV SOLN: INTRAVENOUS | Status: DC

## 2018-04-05 MED ORDER — SODIUM CHLORIDE 0.9 % IV SOLN: INTRAVENOUS | Status: DC

## 2018-04-05 MED ORDER — FENTANYL CITRATE (PF) 100 MCG/2ML IJ SOLN: 25.0000 ug | INTRAMUSCULAR | Status: DC | PRN

## 2018-04-05 MED ORDER — CLONIDINE HCL (ANALGESIA) 100 MCG/ML EP SOLN
EPIDURAL | Status: DC | PRN
  Administered 2018-04-05: 75 ug

## 2018-04-05 MED ORDER — FENTANYL CITRATE (PF) 100 MCG/2ML IJ SOLN
50.0000 ug | INTRAMUSCULAR | Status: DC | PRN
  Administered 2018-04-05: 100 ug via INTRAVENOUS

## 2018-04-05 MED ORDER — CEFAZOLIN SODIUM-DEXTROSE 2-4 GM/100ML-% IV SOLN
2.0000 g | INTRAVENOUS | Status: AC
  Administered 2018-04-05: 2 g via INTRAVENOUS

## 2018-04-05 MED ORDER — 0.9 % SODIUM CHLORIDE (POUR BTL) OPTIME
TOPICAL | Status: DC | PRN
  Administered 2018-04-05: 1000 mL

## 2018-04-05 MED ORDER — CEFAZOLIN SODIUM-DEXTROSE 2-4 GM/100ML-% IV SOLN
INTRAVENOUS | Status: AC
  Filled 2018-04-05: qty 100

## 2018-04-05 MED ORDER — DEXAMETHASONE SODIUM PHOSPHATE 4 MG/ML IJ SOLN
INTRAMUSCULAR | Status: DC | PRN
  Administered 2018-04-05: 10 mg via INTRAVENOUS

## 2018-04-05 MED ORDER — SUCCINYLCHOLINE CHLORIDE 20 MG/ML IJ SOLN
INTRAMUSCULAR | Status: DC | PRN
  Administered 2018-04-05: 100 mg via INTRAVENOUS

## 2018-04-05 MED ORDER — HYDROCODONE-ACETAMINOPHEN 5-325 MG PO TABS: 1.0000 | ORAL_TABLET | Freq: Four times a day (QID) | ORAL | 0 refills | Status: AC | PRN

## 2018-04-05 MED ORDER — FENTANYL CITRATE (PF) 100 MCG/2ML IJ SOLN
INTRAMUSCULAR | Status: DC | PRN
  Administered 2018-04-05: 50 ug via INTRAVENOUS

## 2018-04-05 MED ORDER — MIDAZOLAM HCL 2 MG/2ML IJ SOLN
INTRAMUSCULAR | Status: AC
  Filled 2018-04-05: qty 2

## 2018-04-05 MED ORDER — LIDOCAINE HCL (CARDIAC) PF 100 MG/5ML IV SOSY
PREFILLED_SYRINGE | INTRAVENOUS | Status: DC | PRN
  Administered 2018-04-05: 30 mg via INTRAVENOUS

## 2018-04-05 MED ORDER — PROPOFOL 10 MG/ML IV BOLUS
INTRAVENOUS | Status: DC | PRN
  Administered 2018-04-05: 100 mg via INTRAVENOUS

## 2018-04-05 SURGICAL SUPPLY — 85 items
ANCH SUT 2 2.9 2 LD TPR NDL (Anchor) ×2 IMPLANT
ANCH SUT 4.75 LNK KNTLS STRL (Anchor) ×2 IMPLANT
ANCHOR JUGGERKNOT WTAP NDL 2.9 (Anchor) ×4 IMPLANT
ANCHOR KNTLS VENTIX 4.75 (Anchor) ×4 IMPLANT
BANDAGE ACE 4X5 VEL STRL LF (GAUZE/BANDAGES/DRESSINGS) IMPLANT
BANDAGE ESMARK 6X9 LF (GAUZE/BANDAGES/DRESSINGS) IMPLANT
BIT DRILL JUGRKNT W/NDL BIT2.9 (DRILL) IMPLANT
BLADE AVERAGE 25MMX9MM (BLADE)
BLADE AVERAGE 25X9 (BLADE) IMPLANT
BLADE MICRO SAGITTAL (BLADE) ×2 IMPLANT
BLADE SURG 15 STRL LF DISP TIS (BLADE) ×2 IMPLANT
BLADE SURG 15 STRL SS (BLADE) ×6
BNDG CMPR 9X6 STRL LF SNTH (GAUZE/BANDAGES/DRESSINGS) ×1
BNDG COHESIVE 4X5 TAN STRL (GAUZE/BANDAGES/DRESSINGS) ×2 IMPLANT
BNDG COHESIVE 6X5 TAN STRL LF (GAUZE/BANDAGES/DRESSINGS) ×2 IMPLANT
BNDG ESMARK 6X9 LF (GAUZE/BANDAGES/DRESSINGS) ×3
BOOT STEPPER DURA LG (SOFTGOODS) IMPLANT
BOOT STEPPER DURA MED (SOFTGOODS) IMPLANT
BOOT STEPPER DURA XLG (SOFTGOODS) IMPLANT
CANISTER SUCT 1200ML W/VALVE (MISCELLANEOUS) ×3 IMPLANT
CHLORAPREP W/TINT 26ML (MISCELLANEOUS) ×3 IMPLANT
COVER BACK TABLE 60X90IN (DRAPES) ×3 IMPLANT
COVER WAND RF STERILE (DRAPES) IMPLANT
CUFF TOURNIQUET SINGLE 34IN LL (TOURNIQUET CUFF) ×3 IMPLANT
DRAPE EXTREMITY T 121X128X90 (DRAPE) ×3 IMPLANT
DRAPE OEC MINIVIEW 54X84 (DRAPES) IMPLANT
DRAPE U-SHAPE 47X51 STRL (DRAPES) ×3 IMPLANT
DRILL JUGGERKNOT W/NDL BIT 2.9 (DRILL) ×3
DRSG MEPITEL 4X7.2 (GAUZE/BANDAGES/DRESSINGS) ×3 IMPLANT
DRSG PAD ABDOMINAL 8X10 ST (GAUZE/BANDAGES/DRESSINGS) ×6 IMPLANT
ELECT REM PT RETURN 9FT ADLT (ELECTROSURGICAL) ×3
ELECTRODE REM PT RTRN 9FT ADLT (ELECTROSURGICAL) ×1 IMPLANT
GAUZE SPONGE 4X4 12PLY STRL (GAUZE/BANDAGES/DRESSINGS) ×3 IMPLANT
GLOVE BIO SURGEON STRL SZ8 (GLOVE) ×3 IMPLANT
GLOVE BIOGEL PI IND STRL 7.0 (GLOVE) IMPLANT
GLOVE BIOGEL PI IND STRL 8 (GLOVE) ×2 IMPLANT
GLOVE BIOGEL PI INDICATOR 7.0 (GLOVE) ×2
GLOVE BIOGEL PI INDICATOR 8 (GLOVE) ×2
GLOVE ECLIPSE 6.5 STRL STRAW (GLOVE) ×2 IMPLANT
GLOVE ECLIPSE 8.0 STRL XLNG CF (GLOVE) ×1 IMPLANT
GOWN STRL REUS W/ TWL LRG LVL3 (GOWN DISPOSABLE) ×1 IMPLANT
GOWN STRL REUS W/ TWL XL LVL3 (GOWN DISPOSABLE) ×2 IMPLANT
GOWN STRL REUS W/TWL LRG LVL3 (GOWN DISPOSABLE) ×3
GOWN STRL REUS W/TWL XL LVL3 (GOWN DISPOSABLE) ×6
NDL HYPO 25X1 1.5 SAFETY (NEEDLE) IMPLANT
NDL SUT 6 .5 CRC .975X.05 MAYO (NEEDLE) IMPLANT
NEEDLE HYPO 22GX1.5 SAFETY (NEEDLE) IMPLANT
NEEDLE HYPO 25X1 1.5 SAFETY (NEEDLE) IMPLANT
NEEDLE MAYO TAPER (NEEDLE) ×3
NS IRRIG 1000ML POUR BTL (IV SOLUTION) ×3 IMPLANT
PACK BASIN DAY SURGERY FS (CUSTOM PROCEDURE TRAY) ×3 IMPLANT
PAD CAST 4YDX4 CTTN HI CHSV (CAST SUPPLIES) ×1 IMPLANT
PADDING CAST ABS 4INX4YD NS (CAST SUPPLIES)
PADDING CAST ABS COTTON 4X4 ST (CAST SUPPLIES) IMPLANT
PADDING CAST COTTON 4X4 STRL (CAST SUPPLIES) ×3
PADDING CAST COTTON 6X4 STRL (CAST SUPPLIES) IMPLANT
PENCIL BUTTON HOLSTER BLD 10FT (ELECTRODE) ×3 IMPLANT
SANITIZER HAND PURELL 535ML FO (MISCELLANEOUS) ×3 IMPLANT
SHEET MEDIUM DRAPE 40X70 STRL (DRAPES) ×3 IMPLANT
SLEEVE SCD COMPRESS KNEE MED (MISCELLANEOUS) ×3 IMPLANT
SPLINT FAST PLASTER 5X30 (CAST SUPPLIES) ×40
SPLINT PLASTER CAST FAST 5X30 (CAST SUPPLIES) IMPLANT
SPONGE LAP 18X18 RF (DISPOSABLE) ×3 IMPLANT
STOCKINETTE 6  STRL (DRAPES) ×2
STOCKINETTE 6 STRL (DRAPES) ×1 IMPLANT
SUCTION FRAZIER HANDLE 10FR (MISCELLANEOUS)
SUCTION TUBE FRAZIER 10FR DISP (MISCELLANEOUS) IMPLANT
SUT ETHILON 3 0 PS 1 (SUTURE) ×5 IMPLANT
SUT FIBERWIRE #2 38 T-5 BLUE (SUTURE)
SUT MNCRL AB 3-0 PS2 18 (SUTURE) ×5 IMPLANT
SUT VIC AB 0 SH 27 (SUTURE) ×2 IMPLANT
SUT VIC AB 1 CT1 27 (SUTURE)
SUT VIC AB 1 CT1 27XBRD ANBCTR (SUTURE) IMPLANT
SUT VIC AB 2-0 SH 27 (SUTURE) ×3
SUT VIC AB 2-0 SH 27XBRD (SUTURE) IMPLANT
SUTURE FIBERWR #2 38 T-5 BLUE (SUTURE) IMPLANT
SUTURE TAPE 1.3 FIBERLOP 20 ST (SUTURE) IMPLANT
SUTURETAPE 1.3 FIBERLOOP 20 ST (SUTURE)
SYR BULB 3OZ (MISCELLANEOUS) ×3 IMPLANT
SYR CONTROL 10ML LL (SYRINGE) IMPLANT
TOWEL GREEN STERILE FF (TOWEL DISPOSABLE) ×3 IMPLANT
TUBE CONNECTING 20'X1/4 (TUBING) ×1
TUBE CONNECTING 20X1/4 (TUBING) ×2 IMPLANT
UNDERPAD 30X30 (UNDERPADS AND DIAPERS) ×3 IMPLANT
YANKAUER SUCT BULB TIP NO VENT (SUCTIONS) IMPLANT

## 2018-04-05 NOTE — Anesthesia Postprocedure Evaluation (Signed)
Anesthesia Post Note  Patient: KEARSTYN AVITIA  Procedure(s) Performed: Right achilles tendon reconstruction (Right Leg Lower) Gastroc recession (Right Leg Lower)     Patient location during evaluation: PACU Anesthesia Type: General Level of consciousness: awake and alert Pain management: pain level controlled Vital Signs Assessment: post-procedure vital signs reviewed and stable Respiratory status: spontaneous breathing, nonlabored ventilation, respiratory function stable and patient connected to nasal cannula oxygen Cardiovascular status: blood pressure returned to baseline and stable Postop Assessment: no apparent nausea or vomiting Anesthetic complications: no    Last Vitals:  Vitals:   04/05/18 1530 04/05/18 1545  BP: (!) 116/99 (!) 112/58  Pulse: 73 68  Resp: 18 13  Temp:    SpO2: 98% 96%    Last Pain:  Vitals:   04/05/18 1530  TempSrc:   PainSc: 0-No pain                 Krissa Utke

## 2018-04-05 NOTE — Op Note (Signed)
04/05/2018  3:27 PM  PATIENT:  Stephanie Alvarado  77 y.o. female  PRE-OPERATIVE DIAGNOSIS: 1.  Right chronic achilles tendon avulsion      2.  Right short achilles tendon      3.  Right haglund deformity  POST-OPERATIVE DIAGNOSIS:  Same  Procedure(s): 1.  Right gastroc recession 2.  Right achilles tendon reconstructuion 3.  Excision of right haglund deformity  SURGEON:  Wylene Simmer, MD  ASSISTANT: none  ANESTHESIA:   General, regional  EBL:  minimal   TOURNIQUET:   Total Tourniquet Time Documented: Thigh (Right) - 60 minutes Total: Thigh (Right) - 60 minutes  COMPLICATIONS:  None apparent  DISPOSITION:  Extubated, awake and stable to recovery.  INDICATION FOR PROCEDURE: The patient is a 77 year old female with a past medical history significant for stroke and diabetes.  She injured her ankle 6 weeks ago.  She presented the office last week with a chronic Achilles tendon rupture.  This is an avulsion through an area of chronic tendinopathy.  She has a prominent Haglund deformity.  She has developed an Achilles tendon contracture as a result of the chronic avulsion.  She presents now for operative treatment of this neglected rupture.  She understands the risks and benefits of the alternative treatment options and elects surgical treatment.The risks and benefits of the alternative treatment options have been discussed in detail.  The patient wishes to proceed with surgery and specifically understands risks of bleeding, infection, nerve damage, blood clots, need for additional surgery, amputation and death.  PROCEDURE IN DETAIL: After preoperative consent was obtained the correct operative site was identified the patient was brought to the operating room supine on the stretcher.  Preoperative antibiotics were administered followed by general anesthesia.  A surgical timeout was taken.  The right lower extremity was exsanguinated and the tourniquet inflated to 250 mmHg.  The patient was  then turned into the prone position on the operating table with all bony prominences padded well.  The right lower extremity was prepped and draped in standard sterile fashion.  A longitudinal incision was made over the posterior aspect of the heel.  Dissection was carried down through the subcutaneous tissues and peritenon.  The Achilles tendon was noted to be avulsed from the calcaneus through an area of chronic tendinopathy.  Fragments of bone were noted to be adherent to the Achilles proximally.  These were sharply excised.  The remainder of the tendon appeared relatively healthy.  The distal fragments of tendon were then split longitudinally and elevated medially and laterally exposing the insertional enthesophyte.  An oscillating saw was then used to resect the insertional enthesophyte and Haglund deformity leaving healthy bone.  Attention was turned to the calf where a longitudinal incision was made.  Dissection was carried down through the subcutaneous tissues taking care to protect the sural nerve and lesser saphenous vein.  The gastric anemias tendon was then divided under direct vision.  This allowed the avulsed Achilles to be reduced to the cut surface of bone appropriately.  The Achilles was then repaired to the calcaneus using [employer] juggernaut anchors as well as [facility] medical screw-in anchors with a hourglass pattern of nonabsorbable suture.  With the knee flexed to the ankle was noted to have appropriate resting tension at the Achilles with plantar flexion matching the contralateral side.  Both wounds were then irrigated copiously.  The peritenon was repaired with inverted simple sutures of 2-0 Vicryl.  Subcutaneous tissues were approximated with Monocryl.  The skin  incision was closed with 3-0 nylon horizontal mattress sutures.  Sterile dressings were applied followed by a well-padded short leg splint.  The tourniquet was released after application of the dressings.  The patient was awakened  from anesthesia and transported to the recovery room in stable condition.   FOLLOW UP PLAN: Nonweightbearing on the right lower externally.  Follow-up in the office in 2 weeks for suture removal and conversion to a cam boot with 2 heel lifts.  Resume blood thinners.

## 2018-04-05 NOTE — Anesthesia Procedure Notes (Signed)
Procedure Name: Intubation Date/Time: 04/05/2018 1:59 PM Performed by: Signe Colt, CRNA Pre-anesthesia Checklist: Patient identified, Emergency Drugs available, Suction available and Patient being monitored Patient Re-evaluated:Patient Re-evaluated prior to induction Oxygen Delivery Method: Circle system utilized Preoxygenation: Pre-oxygenation with 100% oxygen Induction Type: IV induction Ventilation: Mask ventilation without difficulty Laryngoscope Size: Mac and 3 Grade View: Grade III Tube type: Oral Tube size: 7.0 mm Number of attempts: 2 Airway Equipment and Method: Stylet,  Oral airway and Bougie stylet Placement Confirmation: ETT inserted through vocal cords under direct vision,  positive ETCO2 and breath sounds checked- equal and bilateral Secured at: 21 cm Tube secured with: Tape Dental Injury: Teeth and Oropharynx as per pre-operative assessment  Comments: Smooth IV induction easy mask DL x 1 arytenoids visualized but unable to pass ETT, easy mask, DL x 2 same view boogie utilized atraumatic placement 7.0 ETT BBS, +ETCO2, teeth and gums unchanged

## 2018-04-05 NOTE — H&P (Signed)
Stephanie Alvarado is an 77 y.o. female.   Chief Complaint: Right posterior ankle pain HPI: The patient is a 77 year old female who injured her right ankle about 6 weeks ago.  She was seen in the office last week and found to have an Achilles tendon rupture.  She presents now for surgical treatment.  She understands the risks and benefits of the alternative treatment options and elects surgical treatment.  Past Medical History:  Diagnosis Date  . Achilles tendon rupture, right, initial encounter   . Allergic rhinitis, cause unspecified   . Anemia, unspecified   . Anxiety state, unspecified   . Atherosclerosis of aorta ([place])    TEE, June, 2010, grade 4 aortic arch atherosclerosis , Dr. Aundra Dubin  . CAD (coronary artery disease)    DES and circumflex 2006 /  DES to LAD 2011  . Carotid artery disease ([place])    Doppler, November, 2011, stable, 0-39% bilateral, turbulent flow left subclavian  . Chronic diastolic heart failure ([place])   . Cyst of thyroid   . Diverticulosis of colon (without mention of hemorrhage)   . Dysphasia    Possibly esophageal stricture  . Ejection fraction     EF 60%, Echo, November 12, 2008, /  EF 55%, TEE, November 17, 2008  . Esophageal reflux   . Hiatal hernia   . Hyperlipemia   . Hypertension   . Lumbago   . Lumbar disc disease   . MVP (mitral valve prolapse)   . Nonspecific abnormal finding in stool contents   . Obesity, unspecified   . Osteoarthrosis, unspecified whether generalized or localized, unspecified site   . Osteopenia   . Other diseases of lung, not elsewhere classified   . Palpitations    October, 2012  . Peripheral vascular disease, unspecified ([place])   . Personal history of colonic polyps    ADENOMATOUS POLYP  . PFO (patent foramen ovale)    Patient had a very small PFO by TEE 2010.  This was seen only by bubble analysis during Valsalva  . Shortness of breath   . Sleep apnea    uses CPAP nightly  . Sleep related hypoventilation/hypoxemia in conditions  classifiable elsewhere   . Stroke [facility]) 11/2008   Treated with TPA? /     2010, TEE no left atrial clot, probably from atherosclerosis of the aortic arch  . 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 Victorino Dike.  Marland Kitchen Unspecified cerebral artery occlusion with cerebral infarction   . Unspecified venous (peripheral) insufficiency     Past Surgical History:  Procedure Laterality Date  . BACK SURGERY    . cataracts     both eyes  . CHOLECYSTECTOMY    . CORONARY ANGIOPLASTY WITH STENT PLACEMENT  1990's  . LAPAROSCOPIC HYSTERECTOMY    . LUMBAR DISC SURGERY     X 3  . RIGHT/LEFT HEART CATH AND CORONARY ANGIOGRAPHY N/A 04/10/2017   Procedure: RIGHT/LEFT HEART CATH AND CORONARY ANGIOGRAPHY;  Surgeon: Belva Crome, MD;  Location: [facility];  Service: Cardiovascular;  Laterality: N/A;  . urethral suspension  1993   Dr. Nori Riis  . VARICOSE VEIN SURGERY     x 2    Family History  Problem Relation Age of Onset  . Heart disease Father   . Heart attack Father   . Alzheimer's disease Mother   . Colon cancer Paternal Aunt   . Breast cancer Unknown        cousions  .  Esophageal cancer Neg Hx   . Rectal cancer Neg Hx   . Stomach cancer Neg Hx    Social History:  reports that she quit smoking about 19 years ago. Her smoking use included cigarettes. She has a 40.00 pack-year smoking history. She has never used smokeless tobacco. She reports that she does not drink alcohol or use drugs.  Allergies:  Allergies  Allergen Reactions  . Codeine Itching    REACTION: itch  . Lipitor [Atorvastatin] Other (See Comments)    myalgias    Medications Prior to Admission  Medication Sig Dispense Refill  . amLODipine (NORVASC) 5 MG tablet Take 5 mg by mouth daily.      Marland Kitchen buPROPion (WELLBUTRIN XL) 150 MG 24 hr tablet Take 150 mg by mouth 2 (two) times daily.     . diphenhydrAMINE (BENADRYL) 25 mg capsule Take 25 mg by mouth every 6 (six) hours as needed.    . furosemide (LASIX) 40 MG  tablet Take 40-80 mg by mouth daily.     Marland Kitchen gabapentin (NEURONTIN) 300 MG capsule Take 600 mg by mouth 3 (three) times daily.     . isosorbide mononitrate (IMDUR) 30 MG 24 hr tablet Take 30 mg by mouth daily.    . Melatonin 10 MG TABS Take 10 mg by mouth at bedtime.    . metoprolol tartrate (LOPRESSOR) 50 MG tablet Take 50 mg by mouth 2 (two) times daily.     . pantoprazole (PROTONIX) 40 MG tablet Take 40 mg by mouth 2 (two) times daily.     . polyethylene glycol powder (GLYCOLAX/MIRALAX) powder Take 17 g by mouth daily as needed for moderate constipation.     . rosuvastatin (CRESTOR) 10 MG tablet Take 10 mg by mouth at bedtime.    . clopidogrel (PLAVIX) 75 MG tablet Take 1 tablet (75 mg total) by mouth daily. 90 tablet 3  . nitroGLYCERIN (NITROSTAT) 0.4 MG SL tablet Place 0.4 mg under the tongue every 5 (five) minutes as needed for chest pain (TAKE UP TO 3 DOSES BEFORE CALLING 911).      Results for orders placed or performed during the hospital encounter of 04/05/18 (from the past 48 hour(s))  Basic metabolic panel     Status: Abnormal   Collection Time: 04/03/18  2:21 PM  Result Value Ref Range   Sodium 141 135 - 145 mmol/L   Potassium 4.7 3.5 - 5.1 mmol/L   Chloride 104 98 - 111 mmol/L   CO2 30 22 - 32 mmol/L   Glucose, Bld 100 (H) 70 - 99 mg/dL   BUN 12 8 - 23 mg/dL   Creatinine, Ser 1.02 (H) 0.44 - 1.00 mg/dL   Calcium 10.0 8.9 - 10.3 mg/dL   GFR calc non Af Amer 51 (L) >60 mL/min   GFR calc Af Amer 59 (L) >60 mL/min    Comment: (NOTE) The eGFR has been calculated using the CKD EPI equation. This calculation has not been validated in all clinical situations. eGFR's persistently <60 mL/min signify possible Chronic Kidney Disease.    Anion gap 7 5 - 15    Comment: Performed at [facility] [address] [place], [place] [address]   No results found.  ROS no recent fever, chills, nausea, vomiting or changes in her appetite  Blood pressure 136/71, pulse 64,  temperature 97.9 F (36.6 C), temperature source Oral, resp. rate 18, height 5' 7" (1.702 m), weight 94 kg, SpO2 98 %. Physical Exam  Well-nourished  well-developed woman in no apparent distress.  Alert and oriented x4.  Mood and affect are normal.  Extraocular motions are intact.  Respirations are unlabored.  Gait is nonweightbearing on the right.  Skin is healthy and intact.  Pulses are palpable.  No lymphadenopathy.  Positive Thompson test Assessment/Plan Right Achilles tendon chronic rupture, Haglund deformity and short Achilles.  To the operating room today for gastrocnemius recession and Achilles tendon reconstruction with excision of the Haglund deformity.  The risks and benefits of the alternative treatment options have been discussed in detail.  The patient wishes to proceed with surgery and specifically understands risks of bleeding, infection, nerve damage, blood clots, need for additional surgery, amputation and death.   Wylene Simmer, MD April 30, 2018, 1:22 PM

## 2018-04-05 NOTE — Progress Notes (Signed)
Assisted Dr. Oddono with right, ultrasound guided, popliteal block. Side rails up, monitors on throughout procedure. See vital signs in flow sheet. Tolerated Procedure well. 

## 2018-04-05 NOTE — Anesthesia Procedure Notes (Signed)
Anesthesia Regional Block: Popliteal block   Pre-Anesthetic Checklist: ,, timeout performed, Correct Patient, Correct Site, Correct Laterality, Correct Procedure, Correct Position, site marked, Risks and benefits discussed,  Surgical consent,  Pre-op evaluation,  At surgeon's request and post-op pain management  Laterality: Right  Prep: chloraprep       Needles:  Injection technique: Single-shot  Needle Type: Echogenic Stimulator Needle     Needle Length: 5cm  Needle Gauge: 22     Additional Needles:   Procedures:, nerve stimulator,,, ultrasound used (permanent image in chart),,,,   Nerve Stimulator or Paresthesia:  Response: quadraceps contraction, 0.45 mA,   Additional Responses:   Narrative:  Start time: 04/05/2018 1:33 PM End time: 04/05/2018 1:38 PM Injection made incrementally with aspirations every 5 mL.  Performed by: Personally  Anesthesiologist: Janeece Riggers, MD  Additional Notes: Functioning IV was confirmed and monitors were applied.  A 44mm 22ga Arrow echogenic stimulator needle was used. Sterile prep and drape,hand hygiene and sterile gloves were used. Ultrasound guidance: relevant anatomy identified, needle position confirmed, local anesthetic spread visualized around nerve(s)., vascular puncture avoided.  Image printed for medical record. Negative aspiration and negative test dose prior to incremental administration of local anesthetic. The patient tolerated the procedure well.

## 2018-04-05 NOTE — Anesthesia Preprocedure Evaluation (Addendum)
Anesthesia Evaluation  Patient identified by MRN, date of birth, ID band Patient awake    Reviewed: Allergy & Precautions, H&P , NPO status , Patient's Chart, lab work & pertinent test results  Airway Mallampati: II  TM Distance: >3 FB Neck ROM: Full    Dental no notable dental hx.    Pulmonary neg pulmonary ROS, sleep apnea , former smoker,    Pulmonary exam normal breath sounds clear to auscultation       Cardiovascular hypertension, Pt. on medications + CAD, + Cardiac Stents and + Peripheral Vascular Disease  negative cardio ROS Normal cardiovascular exam Rhythm:Regular Rate:Normal  Cath 10 /19  Widely patent stent in the mid RCA.  60% ostial stenosis in the continuation of the RCA beyond the PDA.  Patent LAD stent with distal third 60-70% in-stent restenosis.  90% first septal perforator.  30-40% mid circumflex  Low normal left ventricular systolic function with estimated EF 50%.  Normal LV hemodynamics.  Normal right heart pressures with mean pulmonary capillary wedge pressure of 10 mmHg.   Neuro/Psych CVA negative neurological ROS  negative psych ROS   GI/Hepatic Neg liver ROS, GERD  ,  Endo/Other  negative endocrine ROS  Renal/GU negative Renal ROS  negative genitourinary   Musculoskeletal negative musculoskeletal ROS (+)   Abdominal   Peds negative pediatric ROS (+)  Hematology negative hematology ROS (+)   Anesthesia Other Findings   Reproductive/Obstetrics negative OB ROS                            Anesthesia Physical  Anesthesia Plan  ASA: III  Anesthesia Plan: General   Post-op Pain Management: GA combined w/ Regional for post-op pain   Induction: Intravenous  PONV Risk Score and Plan: 2 and Ondansetron, Dexamethasone and Treatment may vary due to age or medical condition  Airway Management Planned: Oral ETT and LMA  Additional Equipment:   Intra-op Plan:    Post-operative Plan: Extubation in OR  Informed Consent: I have reviewed the patients History and Physical, chart, labs and discussed the procedure including the risks, benefits and alternatives for the proposed anesthesia with the patient or authorized representative who has indicated his/her understanding and acceptance.   Dental advisory given  Plan Discussed with: CRNA and Surgeon  Anesthesia Plan Comments: (Discussed both nerve block for pain relief post-op and GA; including NV, sore throat, dental injury, and pulmonary complications)        Anesthesia Quick Evaluation

## 2018-04-05 NOTE — Discharge Instructions (Signed)
Stephanie Simmer, MD [place]  Please read the following information regarding your care after surgery.  Medications  You only need a prescription for the narcotic pain medicine (ex. oxycodone, Percocet, Norco).  All of the other medicines listed below are available over the counter. X Aleve 1 pill twice a day for the first 3 days after surgery. X acetominophen (Tylenol) 650 mg every 4-6 hours as you need for minor to moderate pain X Norco as prescribed for severe pain  Narcotic pain medicine (ex. oxycodone, Percocet, Vicodin) will cause constipation.  To prevent this problem, take the following medicines while you are taking any pain medicine. X docusate sodium (Colace) 100 mg twice a day X senna (Senokot) 2 tablets twice a day  X To help prevent blood clots, resume your plavix.  You should also get up every hour while you are awake to move around.    Weight Bearing ? Bear weight when you are able on your operated leg or foot. ? Bear weight only on your operated foot in the post-op shoe. X Do not bear any weight on the operated leg or foot.  Cast / Splint / Dressing X Keep your splint, cast or dressing clean and dry.  Dont put anything (coat hanger, pencil, etc) down inside of it.  If it gets damp, use a hair dryer on the cool setting to dry it.  If it gets soaked, call the office to schedule an appointment for a cast change. ? Remove your dressing 3 days after surgery and cover the incisions with dry dressings.    After your dressing, cast or splint is removed; you may shower, but do not soak or scrub the wound.  Allow the water to run over it, and then gently pat it dry.  Swelling It is normal for you to have swelling where you had surgery.  To reduce swelling and pain, keep your toes above your nose for at least 3 days after surgery.  It may be necessary to keep your foot or leg elevated for several weeks.  If it hurts, it should be elevated.  Follow Up Call my office at  [phone] when you are discharged from the hospital or surgery center to schedule an appointment to be seen two weeks after surgery.  Call my office at [phone] if you develop a fever >101.5 F, nausea, vomiting, bleeding from the surgical site or severe pain.      Post Anesthesia Home Care Instructions  Activity: Get plenty of rest for the remainder of the day. A responsible individual must stay with you for 24 hours following the procedure.  For the next 24 hours, DO NOT: -Drive a car -Paediatric nurse -Drink alcoholic beverages -Take any medication unless instructed by your physician -Make any legal decisions or sign important papers.  Meals: Start with liquid foods such as gelatin or soup. Progress to regular foods as tolerated. Avoid greasy, spicy, heavy foods. If nausea and/or vomiting occur, drink only clear liquids until the nausea and/or vomiting subsides. Call your physician if vomiting continues.  Special Instructions/Symptoms: Your throat may feel dry or sore from the anesthesia or the breathing tube placed in your throat during surgery. If this causes discomfort, gargle with warm salt water. The discomfort should disappear within 24 hours.  If you had a scopolamine patch placed behind your ear for the management of post- operative nausea and/or vomiting:  1. The medication in the patch is effective for 72 hours, after which it should be  removed.  Wrap patch in a tissue and discard in the trash. Wash hands thoroughly with soap and water. 2. You may remove the patch earlier than 72 hours if you experience unpleasant side effects which may include dry mouth, dizziness or visual disturbances. 3. Avoid touching the patch. Wash your hands with soap and water after contact with the patch.     Regional Anesthesia Blocks  1. Numbness or the inability to move the "blocked" extremity may last from 3-48 hours after placement. The length of time depends on the medication  injected and your individual response to the medication. If the numbness is not going away after 48 hours, call your surgeon.  2. The extremity that is blocked will need to be protected until the numbness is gone and the  Strength has returned. Because you cannot feel it, you will need to take extra care to avoid injury. Because it may be weak, you may have difficulty moving it or using it. You may not know what position it is in without looking at it while the block is in effect.  3. For blocks in the legs and feet, returning to weight bearing and walking needs to be done carefully. You will need to wait until the numbness is entirely gone and the strength has returned. You should be able to move your leg and foot normally before you try and bear weight or walk. You will need someone to be with you when you first try to ensure you do not fall and possibly risk injury.  4. Bruising and tenderness at the needle site are common side effects and will resolve in a few days.  5. Persistent numbness or new problems with movement should be communicated to the surgeon or the [place] [phone] [place] [phone]).

## 2018-04-05 NOTE — Transfer of Care (Signed)
Immediate Anesthesia Transfer of Care Note  Patient: Stephanie Alvarado  Procedure(s) Performed: Right achilles tendon reconstruction (Right Leg Lower) Gastroc recession (Right Leg Lower)  Patient Location: PACU  Anesthesia Type:GA combined with regional for post-op pain  Level of Consciousness: awake, alert  and oriented  Airway & Oxygen Therapy: Patient Spontanous Breathing and Patient connected to face mask oxygen  Post-op Assessment: Report given to RN and Post -op Vital signs reviewed and stable  Post vital signs: Reviewed and stable  Last Vitals:  Vitals Value Taken Time  BP 135/70 04/05/2018  3:09 PM  Temp    Pulse 79 04/05/2018  3:11 PM  Resp 20 04/05/2018  3:11 PM  SpO2 100 % 04/05/2018  3:11 PM  Vitals shown include unvalidated device data.  Last Pain:  Vitals:   04/05/18 1255  TempSrc: Oral  PainSc: 8       Patients Stated Pain Goal: 4 (62/44/69 5072)  Complications: No apparent anesthesia complications

## 2018-04-06 ENCOUNTER — Encounter (HOSPITAL_BASED_OUTPATIENT_CLINIC_OR_DEPARTMENT_OTHER): Payer: Self-pay | Admitting: Orthopedic Surgery

## 2018-04-11 DIAGNOSIS — N183 Chronic kidney disease, stage 3 (moderate): Secondary | ICD-10-CM | POA: Diagnosis not present

## 2018-04-11 DIAGNOSIS — K219 Gastro-esophageal reflux disease without esophagitis: Secondary | ICD-10-CM | POA: Diagnosis not present

## 2018-04-11 DIAGNOSIS — E782 Mixed hyperlipidemia: Secondary | ICD-10-CM | POA: Diagnosis not present

## 2018-04-11 DIAGNOSIS — E039 Hypothyroidism, unspecified: Secondary | ICD-10-CM | POA: Diagnosis not present

## 2018-04-11 DIAGNOSIS — I10 Essential (primary) hypertension: Secondary | ICD-10-CM | POA: Diagnosis not present

## 2018-04-13 DIAGNOSIS — Q7649 Other congenital malformations of spine, not associated with scoliosis: Secondary | ICD-10-CM | POA: Diagnosis not present

## 2018-04-13 DIAGNOSIS — M66861 Spontaneous rupture of other tendons, right lower leg: Secondary | ICD-10-CM | POA: Diagnosis not present

## 2018-04-13 DIAGNOSIS — M419 Scoliosis, unspecified: Secondary | ICD-10-CM | POA: Diagnosis not present

## 2018-04-13 DIAGNOSIS — R269 Unspecified abnormalities of gait and mobility: Secondary | ICD-10-CM | POA: Diagnosis not present

## 2018-04-26 DIAGNOSIS — R269 Unspecified abnormalities of gait and mobility: Secondary | ICD-10-CM | POA: Diagnosis not present

## 2018-04-26 DIAGNOSIS — M419 Scoliosis, unspecified: Secondary | ICD-10-CM | POA: Diagnosis not present

## 2018-04-26 DIAGNOSIS — M66861 Spontaneous rupture of other tendons, right lower leg: Secondary | ICD-10-CM | POA: Diagnosis not present

## 2018-04-26 DIAGNOSIS — Q7649 Other congenital malformations of spine, not associated with scoliosis: Secondary | ICD-10-CM | POA: Diagnosis not present

## 2018-05-12 IMAGING — NM NM MISC PROCEDURE
6 series · 36 of 36 positions shown · non-contrast
Comparison: none

[Series 1: wbr_s-proj_st stress-sum-em · 6.40mm/px · 6 of 64 frames shown]
[frame 6/64]
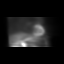
[frame 16/64]
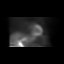
[frame 27/64]
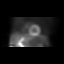
[frame 38/64]
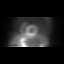
[frame 48/64]
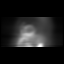
[frame 59/64]
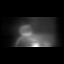

[Series 1: wbr_r-proj_st rest · 6.40mm/px · 6 of 64 frames shown]
[frame 6/64]
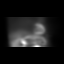
[frame 16/64]
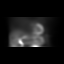
[frame 27/64]
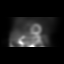
[frame 38/64]
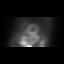
[frame 48/64]
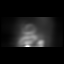
[frame 59/64]
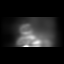

[Series 1: wbr_s-proj_st stress-gsp · 6.40mm/px · 6 of 512 frames shown]
[frame 43/512]
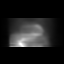
[frame 128/512]
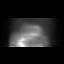
[frame 214/512]
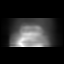
[frame 299/512]
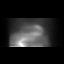
[frame 384/512]
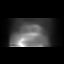
[frame 470/512]
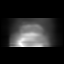

[Series 1: rest · 6.40mm/px · 6 of 64 frames shown]
[frame 6/64]
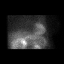
[frame 16/64]
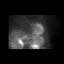
[frame 27/64]
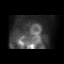
[frame 38/64]
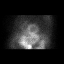
[frame 48/64]
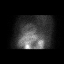
[frame 59/64]
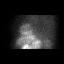

[Series 1: stress-gsp · 6.40mm/px · 6 of 512 frames shown]
[frame 43/512]
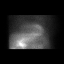
[frame 128/512]
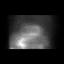
[frame 214/512]
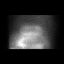
[frame 299/512]
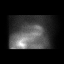
[frame 384/512]
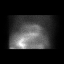
[frame 470/512]
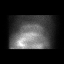

[Series 1: stress-sum-em · 6.40mm/px · 6 of 64 frames shown]
[frame 6/64]
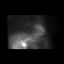
[frame 16/64]
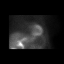
[frame 27/64]
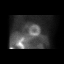
[frame 38/64]
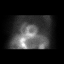
[frame 48/64]
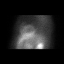
[frame 59/64]
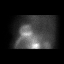

[36 of 36 positions shown; findings below may reference images not displayed]

Canned report from images found in remote index.

Refer to host system for actual result text.

## 2018-05-26 DIAGNOSIS — M66861 Spontaneous rupture of other tendons, right lower leg: Secondary | ICD-10-CM | POA: Diagnosis not present

## 2018-05-26 DIAGNOSIS — Q7649 Other congenital malformations of spine, not associated with scoliosis: Secondary | ICD-10-CM | POA: Diagnosis not present

## 2018-05-26 DIAGNOSIS — M419 Scoliosis, unspecified: Secondary | ICD-10-CM | POA: Diagnosis not present

## 2018-05-26 DIAGNOSIS — R269 Unspecified abnormalities of gait and mobility: Secondary | ICD-10-CM | POA: Diagnosis not present

## 2018-05-28 DIAGNOSIS — M5416 Radiculopathy, lumbar region: Secondary | ICD-10-CM | POA: Diagnosis not present

## 2018-05-29 DIAGNOSIS — Z6830 Body mass index (BMI) 30.0-30.9, adult: Secondary | ICD-10-CM | POA: Diagnosis not present

## 2018-05-29 DIAGNOSIS — G8929 Other chronic pain: Secondary | ICD-10-CM | POA: Diagnosis not present

## 2018-05-29 DIAGNOSIS — E669 Obesity, unspecified: Secondary | ICD-10-CM | POA: Diagnosis not present

## 2018-05-29 DIAGNOSIS — I700 Atherosclerosis of aorta: Secondary | ICD-10-CM | POA: Diagnosis not present

## 2018-05-29 DIAGNOSIS — M25561 Pain in right knee: Secondary | ICD-10-CM | POA: Diagnosis not present

## 2018-05-29 DIAGNOSIS — I10 Essential (primary) hypertension: Secondary | ICD-10-CM | POA: Diagnosis not present

## 2018-05-29 DIAGNOSIS — Z7902 Long term (current) use of antithrombotics/antiplatelets: Secondary | ICD-10-CM | POA: Diagnosis not present

## 2018-05-29 DIAGNOSIS — M797 Fibromyalgia: Secondary | ICD-10-CM | POA: Diagnosis not present

## 2018-05-29 DIAGNOSIS — M4186 Other forms of scoliosis, lumbar region: Secondary | ICD-10-CM | POA: Diagnosis not present

## 2018-05-29 DIAGNOSIS — Z86010 Personal history of colonic polyps: Secondary | ICD-10-CM | POA: Diagnosis not present

## 2018-05-29 DIAGNOSIS — K219 Gastro-esophageal reflux disease without esophagitis: Secondary | ICD-10-CM | POA: Diagnosis not present

## 2018-05-29 DIAGNOSIS — F419 Anxiety disorder, unspecified: Secondary | ICD-10-CM | POA: Diagnosis not present

## 2018-05-29 DIAGNOSIS — I2510 Atherosclerotic heart disease of native coronary artery without angina pectoris: Secondary | ICD-10-CM | POA: Diagnosis not present

## 2018-05-29 DIAGNOSIS — M1991 Primary osteoarthritis, unspecified site: Secondary | ICD-10-CM | POA: Diagnosis not present

## 2018-05-29 DIAGNOSIS — K5900 Constipation, unspecified: Secondary | ICD-10-CM | POA: Diagnosis not present

## 2018-05-29 DIAGNOSIS — M25562 Pain in left knee: Secondary | ICD-10-CM | POA: Diagnosis not present

## 2018-05-29 DIAGNOSIS — I8390 Asymptomatic varicose veins of unspecified lower extremity: Secondary | ICD-10-CM | POA: Diagnosis not present

## 2018-05-29 DIAGNOSIS — M66861 Spontaneous rupture of other tendons, right lower leg: Secondary | ICD-10-CM | POA: Diagnosis not present

## 2018-05-29 DIAGNOSIS — M810 Age-related osteoporosis without current pathological fracture: Secondary | ICD-10-CM | POA: Diagnosis not present

## 2018-05-29 DIAGNOSIS — Z8673 Personal history of transient ischemic attack (TIA), and cerebral infarction without residual deficits: Secondary | ICD-10-CM | POA: Diagnosis not present

## 2018-05-29 DIAGNOSIS — Z87891 Personal history of nicotine dependence: Secondary | ICD-10-CM | POA: Diagnosis not present

## 2018-05-29 DIAGNOSIS — Z9181 History of falling: Secondary | ICD-10-CM | POA: Diagnosis not present

## 2018-05-29 DIAGNOSIS — G4730 Sleep apnea, unspecified: Secondary | ICD-10-CM | POA: Diagnosis not present

## 2018-05-31 DIAGNOSIS — Z01419 Encounter for gynecological examination (general) (routine) without abnormal findings: Secondary | ICD-10-CM | POA: Diagnosis not present

## 2018-05-31 DIAGNOSIS — Z1231 Encounter for screening mammogram for malignant neoplasm of breast: Secondary | ICD-10-CM | POA: Diagnosis not present

## 2018-05-31 DIAGNOSIS — Z6833 Body mass index (BMI) 33.0-33.9, adult: Secondary | ICD-10-CM | POA: Diagnosis not present

## 2018-06-04 DIAGNOSIS — K5900 Constipation, unspecified: Secondary | ICD-10-CM | POA: Diagnosis not present

## 2018-06-04 DIAGNOSIS — I8390 Asymptomatic varicose veins of unspecified lower extremity: Secondary | ICD-10-CM | POA: Diagnosis not present

## 2018-06-04 DIAGNOSIS — M25562 Pain in left knee: Secondary | ICD-10-CM | POA: Diagnosis not present

## 2018-06-04 DIAGNOSIS — G4730 Sleep apnea, unspecified: Secondary | ICD-10-CM | POA: Diagnosis not present

## 2018-06-04 DIAGNOSIS — Z8673 Personal history of transient ischemic attack (TIA), and cerebral infarction without residual deficits: Secondary | ICD-10-CM | POA: Diagnosis not present

## 2018-06-04 DIAGNOSIS — M66861 Spontaneous rupture of other tendons, right lower leg: Secondary | ICD-10-CM | POA: Diagnosis not present

## 2018-06-04 DIAGNOSIS — M810 Age-related osteoporosis without current pathological fracture: Secondary | ICD-10-CM | POA: Diagnosis not present

## 2018-06-04 DIAGNOSIS — M25561 Pain in right knee: Secondary | ICD-10-CM | POA: Diagnosis not present

## 2018-06-04 DIAGNOSIS — K219 Gastro-esophageal reflux disease without esophagitis: Secondary | ICD-10-CM | POA: Diagnosis not present

## 2018-06-04 DIAGNOSIS — I10 Essential (primary) hypertension: Secondary | ICD-10-CM | POA: Diagnosis not present

## 2018-06-04 DIAGNOSIS — Z6830 Body mass index (BMI) 30.0-30.9, adult: Secondary | ICD-10-CM | POA: Diagnosis not present

## 2018-06-04 DIAGNOSIS — G8929 Other chronic pain: Secondary | ICD-10-CM | POA: Diagnosis not present

## 2018-06-04 DIAGNOSIS — I2510 Atherosclerotic heart disease of native coronary artery without angina pectoris: Secondary | ICD-10-CM | POA: Diagnosis not present

## 2018-06-04 DIAGNOSIS — Z9181 History of falling: Secondary | ICD-10-CM | POA: Diagnosis not present

## 2018-06-04 DIAGNOSIS — Z86010 Personal history of colonic polyps: Secondary | ICD-10-CM | POA: Diagnosis not present

## 2018-06-04 DIAGNOSIS — M797 Fibromyalgia: Secondary | ICD-10-CM | POA: Diagnosis not present

## 2018-06-04 DIAGNOSIS — M4186 Other forms of scoliosis, lumbar region: Secondary | ICD-10-CM | POA: Diagnosis not present

## 2018-06-04 DIAGNOSIS — Z7902 Long term (current) use of antithrombotics/antiplatelets: Secondary | ICD-10-CM | POA: Diagnosis not present

## 2018-06-04 DIAGNOSIS — E669 Obesity, unspecified: Secondary | ICD-10-CM | POA: Diagnosis not present

## 2018-06-04 DIAGNOSIS — M1991 Primary osteoarthritis, unspecified site: Secondary | ICD-10-CM | POA: Diagnosis not present

## 2018-06-04 DIAGNOSIS — Z87891 Personal history of nicotine dependence: Secondary | ICD-10-CM | POA: Diagnosis not present

## 2018-06-04 DIAGNOSIS — F419 Anxiety disorder, unspecified: Secondary | ICD-10-CM | POA: Diagnosis not present

## 2018-06-04 DIAGNOSIS — I700 Atherosclerosis of aorta: Secondary | ICD-10-CM | POA: Diagnosis not present

## 2018-06-11 DIAGNOSIS — G8929 Other chronic pain: Secondary | ICD-10-CM | POA: Diagnosis not present

## 2018-06-11 DIAGNOSIS — E669 Obesity, unspecified: Secondary | ICD-10-CM | POA: Diagnosis not present

## 2018-06-11 DIAGNOSIS — G4730 Sleep apnea, unspecified: Secondary | ICD-10-CM | POA: Diagnosis not present

## 2018-06-11 DIAGNOSIS — Z8673 Personal history of transient ischemic attack (TIA), and cerebral infarction without residual deficits: Secondary | ICD-10-CM | POA: Diagnosis not present

## 2018-06-11 DIAGNOSIS — Z7902 Long term (current) use of antithrombotics/antiplatelets: Secondary | ICD-10-CM | POA: Diagnosis not present

## 2018-06-11 DIAGNOSIS — F419 Anxiety disorder, unspecified: Secondary | ICD-10-CM | POA: Diagnosis not present

## 2018-06-11 DIAGNOSIS — I10 Essential (primary) hypertension: Secondary | ICD-10-CM | POA: Diagnosis not present

## 2018-06-11 DIAGNOSIS — M1991 Primary osteoarthritis, unspecified site: Secondary | ICD-10-CM | POA: Diagnosis not present

## 2018-06-11 DIAGNOSIS — M66861 Spontaneous rupture of other tendons, right lower leg: Secondary | ICD-10-CM | POA: Diagnosis not present

## 2018-06-11 DIAGNOSIS — Z6830 Body mass index (BMI) 30.0-30.9, adult: Secondary | ICD-10-CM | POA: Diagnosis not present

## 2018-06-11 DIAGNOSIS — M797 Fibromyalgia: Secondary | ICD-10-CM | POA: Diagnosis not present

## 2018-06-11 DIAGNOSIS — M25562 Pain in left knee: Secondary | ICD-10-CM | POA: Diagnosis not present

## 2018-06-11 DIAGNOSIS — M810 Age-related osteoporosis without current pathological fracture: Secondary | ICD-10-CM | POA: Diagnosis not present

## 2018-06-11 DIAGNOSIS — Z86010 Personal history of colonic polyps: Secondary | ICD-10-CM | POA: Diagnosis not present

## 2018-06-11 DIAGNOSIS — I700 Atherosclerosis of aorta: Secondary | ICD-10-CM | POA: Diagnosis not present

## 2018-06-11 DIAGNOSIS — Z87891 Personal history of nicotine dependence: Secondary | ICD-10-CM | POA: Diagnosis not present

## 2018-06-11 DIAGNOSIS — K219 Gastro-esophageal reflux disease without esophagitis: Secondary | ICD-10-CM | POA: Diagnosis not present

## 2018-06-11 DIAGNOSIS — I2510 Atherosclerotic heart disease of native coronary artery without angina pectoris: Secondary | ICD-10-CM | POA: Diagnosis not present

## 2018-06-11 DIAGNOSIS — M4186 Other forms of scoliosis, lumbar region: Secondary | ICD-10-CM | POA: Diagnosis not present

## 2018-06-11 DIAGNOSIS — K5900 Constipation, unspecified: Secondary | ICD-10-CM | POA: Diagnosis not present

## 2018-06-11 DIAGNOSIS — M25561 Pain in right knee: Secondary | ICD-10-CM | POA: Diagnosis not present

## 2018-06-11 DIAGNOSIS — Z9181 History of falling: Secondary | ICD-10-CM | POA: Diagnosis not present

## 2018-06-11 DIAGNOSIS — I8390 Asymptomatic varicose veins of unspecified lower extremity: Secondary | ICD-10-CM | POA: Diagnosis not present

## 2018-06-18 DIAGNOSIS — M25561 Pain in right knee: Secondary | ICD-10-CM | POA: Diagnosis not present

## 2018-06-18 DIAGNOSIS — F419 Anxiety disorder, unspecified: Secondary | ICD-10-CM | POA: Diagnosis not present

## 2018-06-18 DIAGNOSIS — K219 Gastro-esophageal reflux disease without esophagitis: Secondary | ICD-10-CM | POA: Diagnosis not present

## 2018-06-18 DIAGNOSIS — Z9181 History of falling: Secondary | ICD-10-CM | POA: Diagnosis not present

## 2018-06-18 DIAGNOSIS — K5900 Constipation, unspecified: Secondary | ICD-10-CM | POA: Diagnosis not present

## 2018-06-18 DIAGNOSIS — M25562 Pain in left knee: Secondary | ICD-10-CM | POA: Diagnosis not present

## 2018-06-18 DIAGNOSIS — G4730 Sleep apnea, unspecified: Secondary | ICD-10-CM | POA: Diagnosis not present

## 2018-06-18 DIAGNOSIS — M1991 Primary osteoarthritis, unspecified site: Secondary | ICD-10-CM | POA: Diagnosis not present

## 2018-06-18 DIAGNOSIS — I10 Essential (primary) hypertension: Secondary | ICD-10-CM | POA: Diagnosis not present

## 2018-06-18 DIAGNOSIS — M4186 Other forms of scoliosis, lumbar region: Secondary | ICD-10-CM | POA: Diagnosis not present

## 2018-06-18 DIAGNOSIS — Z6830 Body mass index (BMI) 30.0-30.9, adult: Secondary | ICD-10-CM | POA: Diagnosis not present

## 2018-06-18 DIAGNOSIS — Z86010 Personal history of colonic polyps: Secondary | ICD-10-CM | POA: Diagnosis not present

## 2018-06-18 DIAGNOSIS — M797 Fibromyalgia: Secondary | ICD-10-CM | POA: Diagnosis not present

## 2018-06-18 DIAGNOSIS — I2510 Atherosclerotic heart disease of native coronary artery without angina pectoris: Secondary | ICD-10-CM | POA: Diagnosis not present

## 2018-06-18 DIAGNOSIS — I700 Atherosclerosis of aorta: Secondary | ICD-10-CM | POA: Diagnosis not present

## 2018-06-18 DIAGNOSIS — Z87891 Personal history of nicotine dependence: Secondary | ICD-10-CM | POA: Diagnosis not present

## 2018-06-18 DIAGNOSIS — G8929 Other chronic pain: Secondary | ICD-10-CM | POA: Diagnosis not present

## 2018-06-18 DIAGNOSIS — Z8673 Personal history of transient ischemic attack (TIA), and cerebral infarction without residual deficits: Secondary | ICD-10-CM | POA: Diagnosis not present

## 2018-06-18 DIAGNOSIS — E669 Obesity, unspecified: Secondary | ICD-10-CM | POA: Diagnosis not present

## 2018-06-18 DIAGNOSIS — M810 Age-related osteoporosis without current pathological fracture: Secondary | ICD-10-CM | POA: Diagnosis not present

## 2018-06-18 DIAGNOSIS — I8390 Asymptomatic varicose veins of unspecified lower extremity: Secondary | ICD-10-CM | POA: Diagnosis not present

## 2018-06-18 DIAGNOSIS — M66861 Spontaneous rupture of other tendons, right lower leg: Secondary | ICD-10-CM | POA: Diagnosis not present

## 2018-06-18 DIAGNOSIS — Z7902 Long term (current) use of antithrombotics/antiplatelets: Secondary | ICD-10-CM | POA: Diagnosis not present

## 2018-06-25 DIAGNOSIS — M797 Fibromyalgia: Secondary | ICD-10-CM | POA: Diagnosis not present

## 2018-06-25 DIAGNOSIS — E669 Obesity, unspecified: Secondary | ICD-10-CM | POA: Diagnosis not present

## 2018-06-25 DIAGNOSIS — Z87891 Personal history of nicotine dependence: Secondary | ICD-10-CM | POA: Diagnosis not present

## 2018-06-25 DIAGNOSIS — I8390 Asymptomatic varicose veins of unspecified lower extremity: Secondary | ICD-10-CM | POA: Diagnosis not present

## 2018-06-25 DIAGNOSIS — Z9181 History of falling: Secondary | ICD-10-CM | POA: Diagnosis not present

## 2018-06-25 DIAGNOSIS — M1991 Primary osteoarthritis, unspecified site: Secondary | ICD-10-CM | POA: Diagnosis not present

## 2018-06-25 DIAGNOSIS — Z7902 Long term (current) use of antithrombotics/antiplatelets: Secondary | ICD-10-CM | POA: Diagnosis not present

## 2018-06-25 DIAGNOSIS — I700 Atherosclerosis of aorta: Secondary | ICD-10-CM | POA: Diagnosis not present

## 2018-06-25 DIAGNOSIS — M810 Age-related osteoporosis without current pathological fracture: Secondary | ICD-10-CM | POA: Diagnosis not present

## 2018-06-25 DIAGNOSIS — M25561 Pain in right knee: Secondary | ICD-10-CM | POA: Diagnosis not present

## 2018-06-25 DIAGNOSIS — M4186 Other forms of scoliosis, lumbar region: Secondary | ICD-10-CM | POA: Diagnosis not present

## 2018-06-25 DIAGNOSIS — I10 Essential (primary) hypertension: Secondary | ICD-10-CM | POA: Diagnosis not present

## 2018-06-25 DIAGNOSIS — K5900 Constipation, unspecified: Secondary | ICD-10-CM | POA: Diagnosis not present

## 2018-06-25 DIAGNOSIS — M25562 Pain in left knee: Secondary | ICD-10-CM | POA: Diagnosis not present

## 2018-06-25 DIAGNOSIS — F419 Anxiety disorder, unspecified: Secondary | ICD-10-CM | POA: Diagnosis not present

## 2018-06-25 DIAGNOSIS — G4730 Sleep apnea, unspecified: Secondary | ICD-10-CM | POA: Diagnosis not present

## 2018-06-25 DIAGNOSIS — Z8673 Personal history of transient ischemic attack (TIA), and cerebral infarction without residual deficits: Secondary | ICD-10-CM | POA: Diagnosis not present

## 2018-06-25 DIAGNOSIS — G8929 Other chronic pain: Secondary | ICD-10-CM | POA: Diagnosis not present

## 2018-06-25 DIAGNOSIS — Z86010 Personal history of colonic polyps: Secondary | ICD-10-CM | POA: Diagnosis not present

## 2018-06-25 DIAGNOSIS — I2510 Atherosclerotic heart disease of native coronary artery without angina pectoris: Secondary | ICD-10-CM | POA: Diagnosis not present

## 2018-06-25 DIAGNOSIS — K219 Gastro-esophageal reflux disease without esophagitis: Secondary | ICD-10-CM | POA: Diagnosis not present

## 2018-06-25 DIAGNOSIS — M66861 Spontaneous rupture of other tendons, right lower leg: Secondary | ICD-10-CM | POA: Diagnosis not present

## 2018-06-25 DIAGNOSIS — Z6830 Body mass index (BMI) 30.0-30.9, adult: Secondary | ICD-10-CM | POA: Diagnosis not present

## 2018-06-26 DIAGNOSIS — M1712 Unilateral primary osteoarthritis, left knee: Secondary | ICD-10-CM | POA: Diagnosis not present

## 2018-06-26 DIAGNOSIS — M1711 Unilateral primary osteoarthritis, right knee: Secondary | ICD-10-CM | POA: Diagnosis not present

## 2018-06-26 DIAGNOSIS — Q7649 Other congenital malformations of spine, not associated with scoliosis: Secondary | ICD-10-CM | POA: Diagnosis not present

## 2018-06-26 DIAGNOSIS — R269 Unspecified abnormalities of gait and mobility: Secondary | ICD-10-CM | POA: Diagnosis not present

## 2018-06-26 DIAGNOSIS — M419 Scoliosis, unspecified: Secondary | ICD-10-CM | POA: Diagnosis not present

## 2018-06-26 DIAGNOSIS — M66861 Spontaneous rupture of other tendons, right lower leg: Secondary | ICD-10-CM | POA: Diagnosis not present

## 2018-06-26 DIAGNOSIS — M170 Bilateral primary osteoarthritis of knee: Secondary | ICD-10-CM | POA: Diagnosis not present

## 2018-07-12 DIAGNOSIS — I10 Essential (primary) hypertension: Secondary | ICD-10-CM | POA: Diagnosis not present

## 2018-07-12 DIAGNOSIS — E782 Mixed hyperlipidemia: Secondary | ICD-10-CM | POA: Diagnosis not present

## 2018-07-27 DIAGNOSIS — Q7649 Other congenital malformations of spine, not associated with scoliosis: Secondary | ICD-10-CM | POA: Diagnosis not present

## 2018-07-27 DIAGNOSIS — M66861 Spontaneous rupture of other tendons, right lower leg: Secondary | ICD-10-CM | POA: Diagnosis not present

## 2018-07-27 DIAGNOSIS — R269 Unspecified abnormalities of gait and mobility: Secondary | ICD-10-CM | POA: Diagnosis not present

## 2018-07-27 DIAGNOSIS — M419 Scoliosis, unspecified: Secondary | ICD-10-CM | POA: Diagnosis not present

## 2018-08-08 ENCOUNTER — Ambulatory Visit: Payer: PPO | Admitting: Cardiovascular Disease

## 2018-08-08 ENCOUNTER — Encounter: Payer: Self-pay | Admitting: Cardiovascular Disease

## 2018-08-08 VITALS — BP 116/64 | HR 63 | Ht 67.0 in | Wt 203.1 lb

## 2018-08-08 DIAGNOSIS — R0602 Shortness of breath: Secondary | ICD-10-CM | POA: Diagnosis not present

## 2018-08-08 DIAGNOSIS — I2510 Atherosclerotic heart disease of native coronary artery without angina pectoris: Secondary | ICD-10-CM

## 2018-08-08 DIAGNOSIS — I10 Essential (primary) hypertension: Secondary | ICD-10-CM

## 2018-08-08 MED ORDER — ASPIRIN EC 81 MG PO TBEC: 81.0000 mg | DELAYED_RELEASE_TABLET | Freq: Every day | ORAL | 3 refills | Status: DC

## 2018-08-08 NOTE — Progress Notes (Signed)
Cardiology Office Note:    Date:  08/08/2018   ID:  Stephanie Alvarado, DOB May 09, 1941, MRN [id number]  PCP:  Caryl Bis, MD  Cardiologist:  Sherren Mocha, MD  Electrophysiologist:  None   Referring MD: Caryl Bis, MD   Chief Complaint  Patient presents with  . Shortness of Breath    History of Present Illness:    Stephanie Alvarado is a 78 y.o. female with a hx of coronary artery disease, presenting for follow-up evaluation.  The patient CAD dates back to 2006 when she underwent PCI of the right coronary artery with a Zomax study stent.  In 2009 she developed recurrent symptoms and was found to have progressive stenosis in the proximal LAD, treated with a Promus drug-eluting stent at that time.  A nuclear stress scan in 2017 showed no significant ischemia.  Her last echocardiogram demonstrated normal LV systolic function with mild diastolic dysfunction.  She is having problems with advanced arthritis. States her knees are 'bone on bone.' She complains of bilateral leg pain from the knees down. Also has had a right achilles injury after a fall. Has been told it will take her over a year to recover from this. Her dyspnea is unchanged. She uses oxygen periodically. She denies chest pain or pressure.  She complains of easy bruising and bleeding. Currently take plavix alone - no aspirin.   Past Medical History:  Diagnosis Date  . Achilles tendon rupture, right, initial encounter   . Allergic rhinitis, cause unspecified   . Anemia, unspecified   . Anxiety state, unspecified   . Atherosclerosis of aorta ([place])    TEE, June, 2010, grade 4 aortic arch atherosclerosis , Dr. Aundra Dubin  . CAD (coronary artery disease)    DES and circumflex 2006 /  DES to LAD 2011  . Carotid artery disease ([place])    Doppler, November, 2011, stable, 0-39% bilateral, turbulent flow left subclavian  . Chronic diastolic heart failure ([place])   . Cyst of thyroid   . Diverticulosis of colon (without mention of  hemorrhage)   . Dysphasia    Possibly esophageal stricture  . Ejection fraction     EF 60%, Echo, November 12, 2008, /  EF 55%, TEE, November 17, 2008  . Esophageal reflux   . Hiatal hernia   . Hyperlipemia   . Hypertension   . Lumbago   . Lumbar disc disease   . MVP (mitral valve prolapse)   . Nonspecific abnormal finding in stool contents   . Obesity, unspecified   . Osteoarthrosis, unspecified whether generalized or localized, unspecified site   . Osteopenia   . Other diseases of lung, not elsewhere classified   . Palpitations    October, 2012  . Peripheral vascular disease, unspecified ([place])   . Personal history of colonic polyps    ADENOMATOUS POLYP  . PFO (patent foramen ovale)    Patient had a very small PFO by TEE 2010.  This was seen only by bubble analysis during Valsalva  . Shortness of breath   . Sleep apnea    uses CPAP nightly  . Sleep related hypoventilation/hypoxemia in conditions classifiable elsewhere   . Stroke [facility]) 11/2008   Treated with TPA? /     2010, TEE no left atrial clot, probably from atherosclerosis of the aortic arch  . 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 Victorino Dike.  Marland Kitchen Unspecified cerebral artery occlusion with cerebral infarction   . Unspecified venous (  peripheral) insufficiency     Past Surgical History:  Procedure Laterality Date  . ACHILLES TENDON SURGERY Right 04/05/2018   Procedure: Right achilles tendon reconstruction;  Surgeon: Wylene Simmer, MD;  Location: [facility];  Service: Orthopedics;  Laterality: Right;  72min  . BACK SURGERY    . cataracts     both eyes  . CHOLECYSTECTOMY    . CORONARY ANGIOPLASTY WITH STENT PLACEMENT  1990's  . GASTROCNEMIUS RECESSION Right 04/05/2018   Procedure: Gastroc recession;  Surgeon: Wylene Simmer, MD;  Location: [place];  Service: Orthopedics;  Laterality: Right;  . LAPAROSCOPIC HYSTERECTOMY    . LUMBAR DISC SURGERY     X 3  . RIGHT/LEFT HEART CATH AND  CORONARY ANGIOGRAPHY N/A 04/10/2017   Procedure: RIGHT/LEFT HEART CATH AND CORONARY ANGIOGRAPHY;  Surgeon: Belva Crome, MD;  Location: [place] [facility];  Service: Cardiovascular;  Laterality: N/A;  . urethral suspension  1993   Dr. Nori Riis  . VARICOSE VEIN SURGERY     x 2    Current Medications: Current Meds  Medication Sig  . amLODipine (NORVASC) 5 MG tablet Take 5 mg by mouth daily.    Marland Kitchen buPROPion (WELLBUTRIN XL) 150 MG 24 hr tablet Take 150 mg by mouth 2 (two) times daily.   . diphenhydrAMINE (BENADRYL) 25 mg capsule Take 25 mg by mouth every 6 (six) hours as needed.  . furosemide (LASIX) 40 MG tablet Take 40-80 mg by mouth daily.   Marland Kitchen gabapentin (NEURONTIN) 300 MG capsule Take 600 mg by mouth 3 (three) times daily.   . isosorbide mononitrate (IMDUR) 30 MG 24 hr tablet Take 30 mg by mouth daily.  . Melatonin 10 MG TABS Take 10 mg by mouth at bedtime.  . metoprolol tartrate (LOPRESSOR) 50 MG tablet Take 50 mg by mouth 2 (two) times daily.   . nitroGLYCERIN (NITROSTAT) 0.4 MG SL tablet Place 0.4 mg under the tongue every 5 (five) minutes as needed for chest pain (TAKE UP TO 3 DOSES BEFORE CALLING 911).  . OXYGEN Inhale 2 L into the lungs as needed.  . pantoprazole (PROTONIX) 40 MG tablet Take 40 mg by mouth 2 (two) times daily.   . polyethylene glycol powder (GLYCOLAX/MIRALAX) powder Take 17 g by mouth daily as needed for moderate constipation.   . rosuvastatin (CRESTOR) 10 MG tablet Take 10 mg by mouth at bedtime.  . [DISCONTINUED] clopidogrel (PLAVIX) 75 MG tablet Take 1 tablet (75 mg total) by mouth daily.     Allergies:   Codeine and Lipitor [atorvastatin]   Social History   Socioeconomic History  . Marital status: Widowed    Spouse name: Not on file  . Number of children: 4  . Years of education: Not on file  . Highest education level: Not on file  Occupational History  . Occupation: Retired    Fish farm manager: RETIRED  Social Needs  . Financial resource strain: Not on file    . Food insecurity:    Worry: Not on file    Inability: Not on file  . Transportation needs:    Medical: Not on file    Non-medical: Not on file  Tobacco Use  . Smoking status: Former Smoker    Packs/day: 1.00    Years: 40.00    Pack years: 40.00    Types: Cigarettes    Last attempt to quit: 06/13/1998    Years since quitting: 20.1  . Smokeless tobacco: Never Used  Substance and Sexual Activity  .  Alcohol use: No    Alcohol/week: 0.0 standard drinks  . Drug use: No  . Sexual activity: Not on file  Lifestyle  . Physical activity:    Days per week: Not on file    Minutes per session: Not on file  . Stress: Not on file  Relationships  . Social connections:    Talks on phone: Not on file    Gets together: Not on file    Attends religious service: Not on file    Active member of club or organization: Not on file    Attends meetings of clubs or organizations: Not on file    Relationship status: Not on file  Other Topics Concern  . Not on file  Social History Narrative  . Not on file     Family History: The patient's family history includes Alzheimer's disease in her mother; Breast cancer in her unknown relative; Colon cancer in her paternal aunt; Heart attack in her father; Heart disease in her father. There is no history of Esophageal cancer, Rectal cancer, or Stomach cancer.  ROS:   Please see the history of present illness.    Positive for leg swelling, hearing loss, visual disturbance, abdominal pain, back pain, muscle pain, easy bruising, excessive fatigue, leg pain, snoring, wheezing, constipation, anxiety, joint swelling, balance problems.  All other systems reviewed and are negative.  EKGs/Labs/Other Studies Reviewed:    The following studies were reviewed today: Cardiac catheterization 04/10/2017: Conclusion    Widely patent stent in the mid RCA.  60% ostial stenosis in the continuation of the RCA beyond the PDA.  Patent LAD stent with distal third 60-70%  in-stent restenosis.  90% first septal perforator.  30-40% mid circumflex  Low normal left ventricular systolic function with estimated EF 50%.  Normal LV hemodynamics.  Normal right heart pressures with mean pulmonary capillary wedge pressure of 10 mmHg.  RECOMMENDATIONS:   No high-grade coronary obstruction or abnormal hemodynamics to explain the severity of dyspnea.  The distal margin of the LAD stent is bordering on significant.  Needs to be followed over time for progression.   Coronary Findings   Diagnostic  Dominance: Co-dominant  Left Anterior Descending  Vessel is small.  Mid LAD lesion 60% stenosed  Mid LAD lesion.  Mid LAD to Dist LAD lesion 20% stenosed  The lesion was previously treated.  First Diagonal Branch  Vessel is moderate in size.  First Septal Branch  1st Sept lesion 90% stenosed  1st Sept lesion.  Left Circumflex  Mid Cx to Dist Cx lesion 50% stenosed  Mid Cx to Dist Cx lesion.  Second Obtuse Marginal Branch  Vessel is small in size.  Right Coronary Artery  Prox RCA lesion 30% stenosed  Prox RCA lesion.  Mid RCA lesion 20% stenosed  The lesion was previously treated.  Right Posterior Atrioventricular Branch  Post Atrio lesion 60% stenosed  Post Atrio lesion.  Intervention   No interventions have been documented.  Right Heart   Right Heart Pressures Hemodynamic findings consistent with pulmonary hypertension.  Wall Motion   Resting               Left Heart   Left Ventricle The left ventricular size is normal. The left ventricular systolic function is normal. LV end diastolic pressure is normal. The left ventricular ejection fraction is 50-55% by visual estimate.  Coronary Diagrams   Diagnostic  Dominance: Co-dominant      EKG:  EKG is not ordered today.  Recent Labs: 09/06/2017: Hemoglobin 13.3; Platelets 188.0 04/03/2018: BUN 12; Creatinine, Ser 1.02; Potassium 4.7; Sodium 141  Recent Lipid Panel    Component Value  Date/Time   CHOL 160 05/31/2016 0641   TRIG 144 05/31/2016 0641   TRIG 135 03/28/2006 0936   HDL 73 05/31/2016 0641   CHOLHDL 2.2 05/31/2016 0641   VLDL 29 05/31/2016 0641   LDLCALC 58 05/31/2016 0641   LDLDIRECT 123.3 04/29/2010 0000    Physical Exam:    VS:  BP 116/64   Pulse 63   Ht 5\' 7"  (1.702 m)   Wt 203 lb 1.9 oz (92.1 kg)   SpO2 (!) 88%   BMI 31.81 kg/m     Wt Readings from Last 3 Encounters:  08/08/18 203 lb 1.9 oz (92.1 kg)  04/05/18 207 lb 3.7 oz (94 kg)  03/26/18 195 lb (88.5 kg)     GEN:  Well nourished, well developed in no acute distress HEENT: Normal NECK: No JVD; No carotid bruits LYMPHATICS: No lymphadenopathy CARDIAC: RRR, no murmurs, rubs, gallops RESPIRATORY:  Clear to auscultation without rales, wheezing or rhonchi  ABDOMEN: Soft, non-tender, non-distended MUSCULOSKELETAL:  No edema; No deformity  SKIN: Warm and dry NEUROLOGIC:  Alert and oriented x 3 PSYCHIATRIC:  Normal affect   ASSESSMENT:    1. Essential hypertension   2. CAD in native artery   3. Shortness of breath    PLAN:    In order of problems listed above:  1. Blood pressure is well controlled on amlodipine and isosorbide. 2. The patient is having no angina.  I reviewed her last cardiac catheterization note where she had moderate in-stent restenosis but no lesions that were felt to be flow-limiting.  I advised her to discontinue clopidogrel and start aspirin 81 mg daily.  She is really bothered by the severe bruising she is been experiencing.  Her stents were placed remotely. 3. Appears to be multifactorial.  She uses oxygen as needed.  No evidence of heart failure on her exam.   Medication Adjustments/Labs and Tests Ordered: Current medicines are reviewed at length with the patient today.  Concerns regarding medicines are outlined above.  No orders of the defined types were placed in this encounter.  Meds ordered this encounter  Medications  . aspirin EC 81 MG tablet     Sig: Take 1 tablet (81 mg total) by mouth daily.    Dispense:  90 tablet    Refill:  3    Patient Instructions  Medication Instructions:  1) STOP PLAVIX 2) START ASPIRIN 81 mg daily  Labwork: None  Testing/Procedures: None  Follow-Up: Your provider wants you to follow-up in: 1 year with Dr. Burt Knack or one of his assistants. You will receive a reminder letter in the mail two months in advance. If you don't receive a letter, please call our office to schedule the follow-up appointment.      Signed, Sherren Mocha, MD  08/08/2018 5:33 PM    [place]

## 2018-08-08 NOTE — Patient Instructions (Signed)
Medication Instructions:  1) STOP PLAVIX 2) START ASPIRIN 81 mg daily  Labwork: None  Testing/Procedures: None  Follow-Up: Your provider wants you to follow-up in: 1 year with Dr. Burt Knack or one of his assistants. You will receive a reminder letter in the mail two months in advance. If you don't receive a letter, please call our office to schedule the follow-up appointment.

## 2018-08-09 DIAGNOSIS — M25561 Pain in right knee: Secondary | ICD-10-CM | POA: Diagnosis not present

## 2018-08-09 DIAGNOSIS — M25562 Pain in left knee: Secondary | ICD-10-CM | POA: Diagnosis not present

## 2018-08-09 DIAGNOSIS — M1711 Unilateral primary osteoarthritis, right knee: Secondary | ICD-10-CM | POA: Diagnosis not present

## 2018-08-25 DIAGNOSIS — M419 Scoliosis, unspecified: Secondary | ICD-10-CM | POA: Diagnosis not present

## 2018-08-25 DIAGNOSIS — M66861 Spontaneous rupture of other tendons, right lower leg: Secondary | ICD-10-CM | POA: Diagnosis not present

## 2018-08-25 DIAGNOSIS — Q7649 Other congenital malformations of spine, not associated with scoliosis: Secondary | ICD-10-CM | POA: Diagnosis not present

## 2018-08-25 DIAGNOSIS — R269 Unspecified abnormalities of gait and mobility: Secondary | ICD-10-CM | POA: Diagnosis not present

## 2018-09-02 DIAGNOSIS — S61200A Unspecified open wound of right index finger without damage to nail, initial encounter: Secondary | ICD-10-CM | POA: Diagnosis not present

## 2018-09-13 DIAGNOSIS — L720 Epidermal cyst: Secondary | ICD-10-CM | POA: Diagnosis not present

## 2018-09-13 DIAGNOSIS — Z4802 Encounter for removal of sutures: Secondary | ICD-10-CM | POA: Diagnosis not present

## 2018-09-25 DIAGNOSIS — M66861 Spontaneous rupture of other tendons, right lower leg: Secondary | ICD-10-CM | POA: Diagnosis not present

## 2018-09-25 DIAGNOSIS — M419 Scoliosis, unspecified: Secondary | ICD-10-CM | POA: Diagnosis not present

## 2018-09-25 DIAGNOSIS — Q7649 Other congenital malformations of spine, not associated with scoliosis: Secondary | ICD-10-CM | POA: Diagnosis not present

## 2018-09-25 DIAGNOSIS — R269 Unspecified abnormalities of gait and mobility: Secondary | ICD-10-CM | POA: Diagnosis not present

## 2018-10-11 DIAGNOSIS — E782 Mixed hyperlipidemia: Secondary | ICD-10-CM | POA: Diagnosis not present

## 2018-10-11 DIAGNOSIS — I10 Essential (primary) hypertension: Secondary | ICD-10-CM | POA: Diagnosis not present

## 2018-10-15 DIAGNOSIS — B9689 Other specified bacterial agents as the cause of diseases classified elsewhere: Secondary | ICD-10-CM | POA: Diagnosis not present

## 2018-10-15 DIAGNOSIS — K5900 Constipation, unspecified: Secondary | ICD-10-CM | POA: Diagnosis not present

## 2018-10-15 DIAGNOSIS — F419 Anxiety disorder, unspecified: Secondary | ICD-10-CM | POA: Diagnosis not present

## 2018-10-15 DIAGNOSIS — R1031 Right lower quadrant pain: Secondary | ICD-10-CM | POA: Diagnosis not present

## 2018-10-15 DIAGNOSIS — K219 Gastro-esophageal reflux disease without esophagitis: Secondary | ICD-10-CM | POA: Diagnosis not present

## 2018-10-15 DIAGNOSIS — Z79899 Other long term (current) drug therapy: Secondary | ICD-10-CM | POA: Diagnosis not present

## 2018-10-15 DIAGNOSIS — N200 Calculus of kidney: Secondary | ICD-10-CM | POA: Diagnosis not present

## 2018-10-15 DIAGNOSIS — K5730 Diverticulosis of large intestine without perforation or abscess without bleeding: Secondary | ICD-10-CM | POA: Diagnosis not present

## 2018-10-15 DIAGNOSIS — R109 Unspecified abdominal pain: Secondary | ICD-10-CM | POA: Diagnosis not present

## 2018-10-15 DIAGNOSIS — I10 Essential (primary) hypertension: Secondary | ICD-10-CM | POA: Diagnosis not present

## 2018-10-15 DIAGNOSIS — Z87891 Personal history of nicotine dependence: Secondary | ICD-10-CM | POA: Diagnosis not present

## 2018-10-15 DIAGNOSIS — Z885 Allergy status to narcotic agent status: Secondary | ICD-10-CM | POA: Diagnosis not present

## 2018-10-15 DIAGNOSIS — N390 Urinary tract infection, site not specified: Secondary | ICD-10-CM | POA: Diagnosis not present

## 2018-10-15 DIAGNOSIS — Z7902 Long term (current) use of antithrombotics/antiplatelets: Secondary | ICD-10-CM | POA: Diagnosis not present

## 2018-10-25 DIAGNOSIS — M66861 Spontaneous rupture of other tendons, right lower leg: Secondary | ICD-10-CM | POA: Diagnosis not present

## 2018-10-25 DIAGNOSIS — Q7649 Other congenital malformations of spine, not associated with scoliosis: Secondary | ICD-10-CM | POA: Diagnosis not present

## 2018-10-25 DIAGNOSIS — M419 Scoliosis, unspecified: Secondary | ICD-10-CM | POA: Diagnosis not present

## 2018-10-25 DIAGNOSIS — R269 Unspecified abnormalities of gait and mobility: Secondary | ICD-10-CM | POA: Diagnosis not present

## 2018-11-22 ENCOUNTER — Telehealth: Payer: Self-pay | Admitting: *Deleted

## 2018-11-22 NOTE — Telephone Encounter (Signed)
   [facility] Pre-operative Risk Assessment    Request for surgical clearance:  1. What type of surgery is being performed? RIGHT TOTAL KNEE  2. When is this surgery scheduled? 01/14/19  3. What type of clearance is required (medical clearance vs. Pharmacy clearance to hold med vs. Both)? MEDICAL  4. Are there any medications that need to be held prior to surgery and how long? ASA  5. Practice name and name of physician performing surgery? [facility]; DR. Wynelle Link  6. What is your office phone number [phone]   7.   What is your office fax number [phone]  8.   Anesthesia type (None, local, MAC, general) ? CHOICE   Julaine Hua 11/22/2018, 3:01 PM  _________________________________________________________________   (provider comments below)

## 2018-11-23 NOTE — Telephone Encounter (Signed)
Tried a 5th time this afternoon and went straight to VM.

## 2018-11-23 NOTE — Telephone Encounter (Signed)
New Message   Patient returning your call and I let her know you will call her back as instructed.

## 2018-11-23 NOTE — Telephone Encounter (Signed)
   Primary Cardiologist: Sherren Mocha, MD  Chart reviewed as part of pre-operative protocol coverage. Patient was contacted 11/23/2018 in reference to pre-operative risk assessment for pending surgery as outlined below.  Stephanie Alvarado was last seen on 07/2018 by Dr. Burt Knack. She has h/o CAD s/p PCI (last in 4492), diastolic dsyfunction, carotid disease, HTN, HLD, MVP, obesity, stroke, atherosclerosis of aorta, subclavian artery disease. At that visit, Dr. Burt Knack had cleared her to stop Plavix. She is now on aspirin monotherapy. At last cath 03/2017, "No high-grade coronary obstruction or abnormal hemodynamics to explain the severity of dyspnea. The distal margin of the LAD stent is bordering on significant.  Needs to be followed over time for progression." RCRI >11% given CAD, stroke, CHF. I reached out to patient to see how she is doing, got VM. LMOM to call us back.  In meantime will reach out to Dr. Burt Knack for input on holding ASA as requested for procedure, and whether he feels any functional testing is needed prior to surgery given historical cath results. Dr. Burt Knack - Please route response to P CV DIV PREOP (the pre-op pool). Thank you.    Charlie Pitter, PA-C 11/23/2018, 10:54 AM

## 2018-11-23 NOTE — Telephone Encounter (Signed)
Tried to call pt back 4x. Rang and went to VM each time. LMOM to call us back perhaps with a different number.

## 2018-11-25 DIAGNOSIS — M419 Scoliosis, unspecified: Secondary | ICD-10-CM | POA: Diagnosis not present

## 2018-11-25 DIAGNOSIS — M66861 Spontaneous rupture of other tendons, right lower leg: Secondary | ICD-10-CM | POA: Diagnosis not present

## 2018-11-25 DIAGNOSIS — R269 Unspecified abnormalities of gait and mobility: Secondary | ICD-10-CM | POA: Diagnosis not present

## 2018-11-25 DIAGNOSIS — Q7649 Other congenital malformations of spine, not associated with scoliosis: Secondary | ICD-10-CM | POA: Diagnosis not present

## 2018-11-26 NOTE — Telephone Encounter (Signed)
Ok to hold ASA for surgery as requested

## 2018-11-26 NOTE — Telephone Encounter (Signed)
  Chart reviewed as part of pre-operative protocol coverage. Unable to get any exercise due to knee issue but getting at least 4 mets of activity.   Given past medical history and time since last visit, based on ACC/AHA guidelines, Stephanie Alvarado would be at acceptable risk for the planned procedure without further cardiovascular testing.   Dr. Burt Knack, is it okay to hold ASA for 5-7 days prior to surgery? Please forward your response to P CV DIV PREOP.   Thank you  Leanor Kail, PA 11/26/2018, 12:23 PM

## 2018-12-17 DIAGNOSIS — I25111 Atherosclerotic heart disease of native coronary artery with angina pectoris with documented spasm: Secondary | ICD-10-CM | POA: Diagnosis not present

## 2018-12-17 DIAGNOSIS — M545 Low back pain: Secondary | ICD-10-CM | POA: Diagnosis not present

## 2018-12-17 DIAGNOSIS — K219 Gastro-esophageal reflux disease without esophagitis: Secondary | ICD-10-CM | POA: Diagnosis not present

## 2018-12-17 DIAGNOSIS — F331 Major depressive disorder, recurrent, moderate: Secondary | ICD-10-CM | POA: Diagnosis not present

## 2018-12-17 DIAGNOSIS — E6609 Other obesity due to excess calories: Secondary | ICD-10-CM | POA: Diagnosis not present

## 2018-12-17 DIAGNOSIS — E782 Mixed hyperlipidemia: Secondary | ICD-10-CM | POA: Diagnosis not present

## 2018-12-17 DIAGNOSIS — J9611 Chronic respiratory failure with hypoxia: Secondary | ICD-10-CM | POA: Diagnosis not present

## 2018-12-17 DIAGNOSIS — N183 Chronic kidney disease, stage 3 (moderate): Secondary | ICD-10-CM | POA: Diagnosis not present

## 2018-12-17 DIAGNOSIS — Z6830 Body mass index (BMI) 30.0-30.9, adult: Secondary | ICD-10-CM | POA: Diagnosis not present

## 2018-12-17 DIAGNOSIS — I8391 Asymptomatic varicose veins of right lower extremity: Secondary | ICD-10-CM | POA: Diagnosis not present

## 2018-12-17 DIAGNOSIS — I10 Essential (primary) hypertension: Secondary | ICD-10-CM | POA: Diagnosis not present

## 2018-12-17 DIAGNOSIS — Z23 Encounter for immunization: Secondary | ICD-10-CM | POA: Diagnosis not present

## 2018-12-17 DIAGNOSIS — E039 Hypothyroidism, unspecified: Secondary | ICD-10-CM | POA: Diagnosis not present

## 2018-12-17 DIAGNOSIS — J449 Chronic obstructive pulmonary disease, unspecified: Secondary | ICD-10-CM | POA: Diagnosis not present

## 2018-12-24 NOTE — H&P (Signed)
TOTAL KNEE ADMISSION H&P  Patient is being admitted for right total knee arthroplasty.  Subjective:  Chief Complaint:right knee pain.  HPI: Stephanie Alvarado, 78 y.o. female, has a history of pain and functional disability in the right knee due to arthritis and has failed non-surgical conservative treatments for greater than 12 weeks to includeNSAID's and/or analgesics, corticosteriod injections and activity modification.  Onset of symptoms was gradual, starting 3 years ago with gradually worsening course since that time. The patient noted no past surgery on the right knee(s).  Patient currently rates pain in the right knee(s) at 8 out of 10 with activity. Patient has night pain, worsening of pain with activity and weight bearing, pain that interferes with activities of daily living and joint swelling.  Patient has evidence of bone-on-bone in the medial and patellofemoral compartments of the right knee with varus deformity and tibial subluxation by imaging studies. There is no active infection.  Patient Active Problem List   Diagnosis Date Noted   Diarrhea 09/06/2017   RLQ abdominal pain 05/26/2017   Lightheadedness    Ataxia 05/30/2016   Dizziness 05/30/2016   Preoperative clearance 11/10/2015   Varicose veins of left lower extremity with complications 96/75/9163   Varicose veins of leg with complications 84/66/5993   Lumbar scoliosis 11/21/2013   Preop cardiovascular exam 10/29/2013   LLQ abdominal pain 04/05/2013   Diverticulitis 04/05/2013   Palpitations    Subclavian artery disease (HCC)    PFO (patent foramen ovale)    CAD (coronary artery disease)    Ejection fraction    Atherosclerosis of aorta (HCC)    Carotid artery disease ([place])    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   CONSTIPATION 06/11/2010   ABDOMINAL PAIN-LLQ 06/11/2010   History of colonic polyps 06/11/2010   SLEEP RELATED HYPOVENTILATION/HYPOXEMIA CCE 07/17/2009   Stroke ([place]) 11/11/2008     COLONIC POLYPS 08/31/2008   THYROID CYST 08/31/2008   VENOUS INSUFFICIENCY 08/31/2008   DIVERTICULOSIS OF COLON 08/31/2008   LOW BACK PAIN, CHRONIC 08/31/2008   FIBROMYALGIA 08/31/2008   Dyspnea 08/08/2008   HYPERCHOLESTEROLEMIA 04/19/2007   OBESITY 04/19/2007   ANEMIA 04/19/2007   ANXIETY 04/19/2007   Essential hypertension 04/19/2007   ALLERGIC RHINITIS 04/19/2007   PULMONARY NODULE, RIGHT LOWER LOBE 04/19/2007   GERD 04/19/2007   DEGENERATIVE JOINT DISEASE 04/19/2007   Past Medical History:  Diagnosis Date   Achilles tendon rupture, right, initial encounter    Allergic rhinitis, cause unspecified    Anemia, unspecified    Anxiety state, unspecified    Atherosclerosis of aorta ([place])    TEE, June, 2010, grade 4 aortic arch atherosclerosis , Dr. Aundra Dubin   CAD (coronary artery disease)    DES and circumflex 2006 /  DES to LAD 2011   Carotid artery disease ([place])    Doppler, November, 2011, stable, 0-39% bilateral, turbulent flow left subclavian   Chronic diastolic heart failure ([place])    Cyst of thyroid    Diverticulosis of colon (without mention of hemorrhage)    Dysphasia    Possibly esophageal stricture   Ejection fraction     EF 60%, Echo, November 12, 2008, /  EF 55%, TEE, November 17, 2008   Esophageal reflux    Hiatal hernia    Hyperlipemia    Hypertension    Lumbago    Lumbar disc disease    MVP (mitral valve prolapse)    Nonspecific abnormal finding in stool contents    Obesity, unspecified    Osteoarthrosis, unspecified  whether generalized or localized, unspecified site    Osteopenia    Other diseases of lung, not elsewhere classified    Palpitations    October, 2012   Peripheral vascular disease, unspecified ([place])    Personal history of colonic polyps    ADENOMATOUS POLYP   PFO (patent foramen ovale)    Patient had a very small PFO by TEE 2010.  This was seen only by bubble analysis during Valsalva   Shortness of  breath    Sleep apnea    uses CPAP nightly   Sleep related hypoventilation/hypoxemia in conditions classifiable elsewhere    Stroke [facility]) 11/2008   Treated with TPA? /     2010, TEE no left atrial clot, probably from atherosclerosis of the aortic arch   Subclavian artery disease ([place])    Remote surgery by Dr. Victorino Dike.   Unspecified cerebral artery occlusion with cerebral infarction    Unspecified venous (peripheral) insufficiency     Past Surgical History:  Procedure Laterality Date   ACHILLES TENDON SURGERY Right 04/05/2018   Procedure: Right achilles tendon reconstruction;  Surgeon: Wylene Simmer, MD;  Location: [facility];  Service: Orthopedics;  Laterality: Right;  85min   BACK SURGERY     cataracts     both eyes   CHOLECYSTECTOMY     CORONARY ANGIOPLASTY WITH STENT PLACEMENT  1990's   GASTROCNEMIUS RECESSION Right 04/05/2018   Procedure: Gastroc recession;  Surgeon: Wylene Simmer, MD;  Location: [facility];  Service: Orthopedics;  Laterality: Right;   LAPAROSCOPIC HYSTERECTOMY     LUMBAR DISC SURGERY     X 3   RIGHT/LEFT HEART CATH AND CORONARY ANGIOGRAPHY N/A 04/10/2017   Procedure: RIGHT/LEFT HEART CATH AND CORONARY ANGIOGRAPHY;  Surgeon: Belva Crome, MD;  Location: [facility];  Service: Cardiovascular;  Laterality: N/A;   urethral suspension  1993   Dr. Nori Riis   VARICOSE VEIN SURGERY     x 2    No current facility-administered medications for this encounter.    Current Outpatient Medications  Medication Sig Dispense Refill Last Dose   amLODipine (NORVASC) 5 MG tablet Take 5 mg by mouth daily.     Taking   aspirin EC 81 MG tablet Take 1 tablet (81 mg total) by mouth daily. 90 tablet 3    buPROPion (WELLBUTRIN XL) 150 MG 24 hr tablet Take 150 mg by mouth 2 (two) times daily.    Taking   diphenhydrAMINE (BENADRYL) 25 mg capsule Take 25 mg by mouth every 6 (six) hours as needed.   Taking   furosemide (LASIX) 40 MG  tablet Take 40-80 mg by mouth daily.    Taking   gabapentin (NEURONTIN) 300 MG capsule Take 600 mg by mouth 3 (three) times daily.    Taking   isosorbide mononitrate (IMDUR) 30 MG 24 hr tablet Take 30 mg by mouth daily.   Taking   Melatonin 10 MG TABS Take 10 mg by mouth at bedtime.   Taking   metoprolol tartrate (LOPRESSOR) 50 MG tablet Take 50 mg by mouth 2 (two) times daily.    Taking   nitroGLYCERIN (NITROSTAT) 0.4 MG SL tablet Place 0.4 mg under the tongue every 5 (five) minutes as needed for chest pain (TAKE UP TO 3 DOSES BEFORE CALLING 911).   Taking   OXYGEN Inhale 2 L into the lungs as needed.   Taking   pantoprazole (PROTONIX) 40 MG tablet Take 40 mg by mouth 2 (two)  times daily.    Taking   polyethylene glycol powder (GLYCOLAX/MIRALAX) powder Take 17 g by mouth daily as needed for moderate constipation.    Taking   rosuvastatin (CRESTOR) 10 MG tablet Take 10 mg by mouth at bedtime.   Taking   Allergies  Allergen Reactions   Codeine Itching    REACTION: itch   Lipitor [Atorvastatin] Other (See Comments)    myalgias    Social History   Tobacco Use   Smoking status: Former Smoker    Packs/day: 1.00    Years: 40.00    Pack years: 40.00    Types: Cigarettes    Quit date: 06/13/1998    Years since quitting: 20.5   Smokeless tobacco: Never Used  Substance Use Topics   Alcohol use: No    Alcohol/week: 0.0 standard drinks    Family History  Problem Relation Age of Onset   Heart disease Father    Heart attack Father    Alzheimer's disease Mother    Colon cancer Paternal Aunt    Breast cancer Unknown        cousions   Esophageal cancer Neg Hx    Rectal cancer Neg Hx    Stomach cancer Neg Hx      Review of Systems  Constitutional: Negative for chills and fever.  Respiratory: Negative for cough and shortness of breath.   Cardiovascular: Negative for chest pain and palpitations.  Gastrointestinal: Negative for nausea and vomiting.  Musculoskeletal:  Positive for joint pain.  Neurological: Negative for tingling and sensory change.    Objective:  Physical Exam Patient is a 78 year old female.  Well nourished and well developed. General: Alert and oriented x3, cooperative and pleasant, no acute distress. Head: normocephalic, atraumatic, neck supple. Eyes: EOMI. Respiratory: breath sounds clear in all fields, no wheezing, rales, or rhonchi. Cardiovascular: Regular rate and rhythm, no murmurs, gallops or rubs. Abdomen: non-tender to palpation and soft, normoactive bowel sounds. Musculoskeletal: Right Knee Exam: No effusion. Mild to moderate swelling. Range of motion is 5-125 degrees. Moderate crepitus on range of motion of the knee. Signficant medial joint line tenderness No lateral joint line tenderness. Stable knee.  Calves soft and nontender. Motor function intact in LE. Strength 5/5 LE bilaterally. Neuro: Distal pulses 2+. Sensation to light touch intact in LE.  Vital signs in last 24 hours: BP: 124/70 mmHg  Labs:   Estimated body mass index is 31.81 kg/m as calculated from the following:   Height as of 08/08/18: 5\' 7"  (1.702 m).   Weight as of 08/08/18: 92.1 kg.   Imaging Review Plain radiographs demonstrate severe degenerative joint disease of the right knee(s). The overall alignment ismild varus. The bone quality appears to be adequate for age and reported activity level.  Assessment/Plan:  End stage arthritis, right knee   The patient history, physical examination, clinical judgment of the provider and imaging studies are consistent with end stage degenerative joint disease of the right knee(s) and total knee arthroplasty is deemed medically necessary. The treatment options including medical management, injection therapy arthroscopy and arthroplasty were discussed at length. The risks and benefits of total knee arthroplasty were presented and reviewed. The risks due to aseptic loosening, infection, stiffness,  patella tracking problems, thromboembolic complications and other imponderables were discussed. The patient acknowledged the explanation, agreed to proceed with the plan and consent was signed. Patient is being admitted for inpatient treatment for surgery, pain control, PT, OT, prophylactic antibiotics, VTE prophylaxis, progressive ambulation and ADL's and discharge planning.  The patient is planning to be discharged home with HHPT vs OPPT.  Therapy Plans: HHPT outpatient therapy [facility] Disposition: Home alone, son and daughter live near by Planned DVT Prophylaxis: Xarelto 10mg  daily (hx of cardiac stents and CVA) DME needed: walker PCP: Dr. Quillian Quince Cardiologist: Dr. Burt Knack TXA: IV Allergies: Codeine - itching Anesthesia Concerns: nausea/vomiting BMI: 31.7  Other: Hx of cardiac stents, mitral valve prolapse. Hx of CVA in June 2010, on ASA 81 mg daily. Itching with Percocet, but took with Benadryl and did okay.   Patient's anticipated LOS is less than 2 midnights, meeting these requirements: - Lives within 1 hour of care - Has a competent adult at home to recover with post-op recover - NO history of  - Chronic pain requiring opiods  - Diabetes  - Heart failure  - Heart attack  - Stroke  - DVT/VTE  - Cardiac arrhythmia  - Respiratory Failure/COPD  - Renal failure  - Anemia  - Advanced Liver disease  - Patient was instructed on what medications to stop prior to surgery. - Follow-up visit in 2 weeks with Dr. Wynelle Link - Begin physical therapy following surgery - Pre-operative lab work as pre-surgical testing - Prescriptions will be provided in hospital at time of discharge  Griffith Citron, PA-C Orthopedic Surgery [facility] [place]

## 2018-12-25 ENCOUNTER — Telehealth: Payer: Self-pay | Admitting: Cardiovascular Disease

## 2018-12-25 DIAGNOSIS — M419 Scoliosis, unspecified: Secondary | ICD-10-CM | POA: Diagnosis not present

## 2018-12-25 DIAGNOSIS — Q7649 Other congenital malformations of spine, not associated with scoliosis: Secondary | ICD-10-CM | POA: Diagnosis not present

## 2018-12-25 DIAGNOSIS — R269 Unspecified abnormalities of gait and mobility: Secondary | ICD-10-CM | POA: Diagnosis not present

## 2018-12-25 DIAGNOSIS — M66861 Spontaneous rupture of other tendons, right lower leg: Secondary | ICD-10-CM | POA: Diagnosis not present

## 2018-12-25 NOTE — Telephone Encounter (Signed)
New Message    Patient states she need the clearance faxed over to office performing surgery.  Please call patient back.

## 2018-12-25 NOTE — Telephone Encounter (Signed)
Surgical clearance notes have been faxed over to requesting surgeon's office

## 2019-01-08 ENCOUNTER — Encounter (HOSPITAL_COMMUNITY)
Admission: RE | Admit: 2019-01-08 | Discharge: 2019-01-08 | Disposition: A | Discharge status: Home / Self Care | Payer: PPO | Source: Ambulatory Visit | Attending: Orthopedic Surgery | Admitting: Orthopedic Surgery

## 2019-01-08 ENCOUNTER — Other Ambulatory Visit: Payer: Self-pay

## 2019-01-08 ENCOUNTER — Encounter (HOSPITAL_COMMUNITY): Payer: Self-pay

## 2019-01-08 DIAGNOSIS — Z01812 Encounter for preprocedural laboratory examination: Secondary | ICD-10-CM | POA: Diagnosis not present

## 2019-01-08 DIAGNOSIS — M1711 Unilateral primary osteoarthritis, right knee: Secondary | ICD-10-CM | POA: Insufficient documentation

## 2019-01-08 DIAGNOSIS — Z20828 Contact with and (suspected) exposure to other viral communicable diseases: Secondary | ICD-10-CM | POA: Insufficient documentation

## 2019-01-08 LAB — COMPREHENSIVE METABOLIC PANEL
ALT: 12 U/L (ref 0–44)
AST: 17 U/L (ref 15–41)
Albumin: 4.1 g/dL (ref 3.5–5.0)
Alkaline Phosphatase: 93 U/L (ref 38–126)
Anion gap: 9 (ref 5–15)
BUN: 17 mg/dL (ref 8–23)
CO2: 28 mmol/L (ref 22–32)
Calcium: 10.2 mg/dL (ref 8.9–10.3)
Chloride: 105 mmol/L (ref 98–111)
Creatinine, Ser: 0.9 mg/dL (ref 0.44–1.00)
GFR calc Af Amer: >60 mL/min (ref >60)
GFR calc non Af Amer: >60 mL/min (ref >60)
Glucose, Bld: 104 mg/dL — ABNORMAL HIGH (ref 70–99)
Potassium: 4.3 mmol/L (ref 3.5–5.1)
Sodium: 142 mmol/L (ref 135–145)
Total Bilirubin: 0.9 mg/dL (ref 0.3–1.2)
Total Protein: 7.5 g/dL (ref 6.5–8.1)

## 2019-01-08 LAB — CBC
HCT: 43.1 % (ref 36.0–46.0)
Hemoglobin: 13.3 g/dL (ref 12.0–15.0)
MCH: 28.9 pg (ref 26.0–34.0)
MCHC: 30.9 g/dL (ref 30.0–36.0)
MCV: 93.7 fL (ref 80.0–100.0)
Platelets: 170 10*3/uL (ref 150–400)
RBC: 4.6 MIL/uL (ref 3.87–5.11)
RDW: 16.2 % — ABNORMAL HIGH (ref 11.5–15.5)
WBC: 6.4 10*3/uL (ref 4.0–10.5)
nRBC: 0 % (ref 0.0–0.2)

## 2019-01-08 LAB — SURGICAL PCR SCREEN
MRSA, PCR: NEGATIVE
Staphylococcus aureus: NEGATIVE

## 2019-01-08 LAB — PROTIME-INR
INR: 1 (ref 0.8–1.2)
Prothrombin Time: 13 seconds (ref 11.4–15.2)

## 2019-01-08 LAB — APTT: aPTT: 31 seconds (ref 24–36)

## 2019-01-08 NOTE — Patient Instructions (Addendum)
YOU NEED TO HAVE A COVID 19 TEST ON_______ Thursday, July 30TH  @ _10:00______, THIS TEST MUST BE DONE BEFORE SURGERY, COME  [place], [place] , [address]. ONCE YOUR COVID TEST IS COMPLETED, PLEASE BEGIN THE QUARANTINE INSTRUCTIONS AS OUTLINED IN YOUR HANDOUT.                 Bonnielee Haff    Your procedure is scheduled on: 01-14-2019   Report to [facility] Main  Entrance     Report to admitting at 6:55AM  PLEASE BRING CPAP [place]. DEVICE WILL BE PROVIDED!   1 VISITOR IS ALLOWED TO WAIT IN WAITING ROOM  ONLY DAY OF YOUR SURGERY.    Call this number if you have problems the morning of surgery [phone]     Remember: Do not eat food After Midnight. YOU MAY HAVE CLEAR LIQUIDS FROM MIDNIGHT UNTIL 6:25AM. At 6:25AM Please finish the prescribed Pre-Surgery ENSURE drink. Nothing by mouth after you finish the ENSURE drink !     CLEAR LIQUID DIET   Foods Allowed                                                                     Foods Excluded  Coffee and tea, regular and decaf                             liquids that you cannot  Plain Jell-O any favor except red or purple                                           see through such as: Fruit ices (not with fruit pulp)                                     milk, soups, orange juice  Iced Popsicles                                    All solid food Carbonated beverages, regular and diet                                    Cranberry, grape and apple juices Sports drinks like Gatorade Lightly seasoned clear broth or consume(fat free) Sugar, honey syrup  Sample Menu Breakfast                                Lunch                                     Supper Cranberry juice                    Beef broth  Chicken broth Jell-O                                     Grape juice                           Apple juice Coffee or tea                        Jell-O                                       Popsicle                                                Coffee or tea                        Coffee or tea  _____________________________________________________________________   BRUSH YOUR TEETH MORNING OF SURGERY AND RINSE YOUR MOUTH OUT, NO CHEWING GUM CANDY OR MINTS.     Take these medicines the morning of surgery with A SIP OF WATER: AMLODIPINE, WELLBUTRIN, GABPENTIN, ISOSORBIDE, METOPROLOL, PROTONIX                                   You may not have any metal on your body including hair pins and              piercings  Do not wear jewelry, make-up, lotions, powders or perfumes, deodorant             Do not wear nail polish.  Do not shave  48 hours prior to surgery.              Men may shave face and neck.   Do not bring valuables to the hospital. [place].  Contacts, dentures or bridgework may not be worn into surgery.                     Please read over the following fact sheets you were given: _____________________________________________________________________             [facility] - Preparing for Surgery Before surgery, you can play an important role.  Because skin is not sterile, your skin needs to be as free of germs as possible.  You can reduce the number of germs on your skin by washing with CHG (chlorahexidine gluconate) soap before surgery.  CHG is an antiseptic cleaner which kills germs and bonds with the skin to continue killing germs even after washing. Please DO NOT use if you have an allergy to CHG or antibacterial soaps.  If your skin becomes reddened/irritated stop using the CHG and inform your nurse when you arrive at Short Stay. Do not shave (including legs and underarms) for at least 48 hours prior to the first CHG shower.  You may shave your face/neck. Please follow these instructions carefully:  1.  Shower with CHG Soap the night  before surgery and the  morning of Surgery.  2.  If you  choose to wash your hair, wash your hair first as usual with your  normal  shampoo.  3.  After you shampoo, rinse your hair and body thoroughly to remove the  shampoo.                           4.  Use CHG as you would any other liquid soap.  You can apply chg directly  to the skin and wash                       Gently with a scrungie or clean washcloth.  5.  Apply the CHG Soap to your body ONLY FROM THE NECK DOWN.   Do not use on face/ open                           Wound or open sores. Avoid contact with eyes, ears mouth and genitals (private parts).                       Wash face,  Genitals (private parts) with your normal soap.             6.  Wash thoroughly, paying special attention to the area where your surgery  will be performed.  7.  Thoroughly rinse your body with warm water from the neck down.  8.  DO NOT shower/wash with your normal soap after using and rinsing off  the CHG Soap.                9.  Pat yourself dry with a clean towel.            10.  Wear clean pajamas.            11.  Place clean sheets on your bed the night of your first shower and do not  sleep with pets. Day of Surgery : Do not apply any lotions/deodorants the morning of surgery.  Please wear clean clothes to the hospital/surgery center.  FAILURE TO FOLLOW THESE INSTRUCTIONS MAY RESULT IN THE CANCELLATION OF YOUR SURGERY PATIENT SIGNATURE_________________________________  NURSE SIGNATURE__________________________________  ________________________________________________________________________   Adam Phenix  An incentive spirometer is a tool that can help keep your lungs clear and active. This tool measures how well you are filling your lungs with each breath. Taking long deep breaths may help reverse or decrease the chance of developing breathing (pulmonary) problems (especially infection) following:  A long period of time when you are unable to move or be active. BEFORE THE PROCEDURE   If  the spirometer includes an indicator to show your best effort, your nurse or respiratory therapist will set it to a desired goal.  If possible, sit up straight or lean slightly forward. Try not to slouch.  Hold the incentive spirometer in an upright position. INSTRUCTIONS FOR USE  1. Sit on the edge of your bed if possible, or sit up as far as you can in bed or on a chair. 2. Hold the incentive spirometer in an upright position. 3. Breathe out normally. 4. Place the mouthpiece in your mouth and seal your lips tightly around it. 5. Breathe in slowly and as deeply as possible, raising the piston or the ball toward the top of the column. 6. Hold your breath for 3-5 seconds  or for as long as possible. Allow the piston or ball to fall to the bottom of the column. 7. Remove the mouthpiece from your mouth and breathe out normally. 8. Rest for a few seconds and repeat Steps 1 through 7 at least 10 times every 1-2 hours when you are awake. Take your time and take a few normal breaths between deep breaths. 9. The spirometer may include an indicator to show your best effort. Use the indicator as a goal to work toward during each repetition. 10. After each set of 10 deep breaths, practice coughing to be sure your lungs are clear. If you have an incision (the cut made at the time of surgery), support your incision when coughing by placing a pillow or rolled up towels firmly against it. Once you are able to get out of bed, walk around indoors and cough well. You may stop using the incentive spirometer when instructed by your caregiver.  RISKS AND COMPLICATIONS  Take your time so you do not get dizzy or light-headed.  If you are in pain, you may need to take or ask for pain medication before doing incentive spirometry. It is harder to take a deep breath if you are having pain. AFTER USE  Rest and breathe slowly and easily.  It can be helpful to keep track of a log of your progress. Your caregiver can  provide you with a simple table to help with this. If you are using the spirometer at home, follow these instructions: [place] IF:   You are having difficultly using the spirometer.  You have trouble using the spirometer as often as instructed.  Your pain medication is not giving enough relief while using the spirometer.  You develop fever of 100.5 F (38.1 C) or higher. SEEK IMMEDIATE MEDICAL CARE IF:   You cough up bloody sputum that had not been present before.  You develop fever of 102 F (38.9 C) or greater.  You develop worsening pain at or near the incision site. MAKE SURE YOU:   Understand these instructions.  Will watch your condition.  Will get help right away if you are not doing well or get worse. Document Released: 10/10/2006 Document Revised: 08/22/2011 Document Reviewed: 12/11/2006 ExitCare Patient Information 2014 [employer], [place].   ________________________________________________________________________  WHAT IS A BLOOD TRANSFUSION? Blood Transfusion Information  A transfusion is the replacement of blood or some of its parts. Blood is made up of multiple cells which provide different functions.  Red blood cells carry oxygen and are used for blood loss replacement.  White blood cells fight against infection.  Platelets control bleeding.  Plasma helps clot blood.  Other blood products are available for specialized needs, such as hemophilia or other clotting disorders. BEFORE THE TRANSFUSION  Who gives blood for transfusions?   Healthy volunteers who are fully evaluated to make sure their blood is safe. This is blood bank blood. Transfusion therapy is the safest it has ever been in the practice of medicine. Before blood is taken from a donor, a complete history is taken to make sure that person has no history of diseases nor engages in risky social behavior (examples are intravenous drug use or sexual activity with multiple partners). The  donor's travel history is screened to minimize risk of transmitting infections, such as malaria. The donated blood is tested for signs of infectious diseases, such as HIV and hepatitis. The blood is then tested to be sure it is compatible with you in order to  minimize the chance of a transfusion reaction. If you or a relative donates blood, this is often done in anticipation of surgery and is not appropriate for emergency situations. It takes many days to process the donated blood. RISKS AND COMPLICATIONS Although transfusion therapy is very safe and saves many lives, the main dangers of transfusion include:   Getting an infectious disease.  Developing a transfusion reaction. This is an allergic reaction to something in the blood you were given. Every precaution is taken to prevent this. The decision to have a blood transfusion has been considered carefully by your caregiver before blood is given. Blood is not given unless the benefits outweigh the risks. AFTER THE TRANSFUSION  Right after receiving a blood transfusion, you will usually feel much better and more energetic. This is especially true if your red blood cells have gotten low (anemic). The transfusion raises the level of the red blood cells which carry oxygen, and this usually causes an energy increase.  The nurse administering the transfusion will monitor you carefully for complications. HOME CARE INSTRUCTIONS  No special instructions are needed after a transfusion. You may find your energy is better. Speak with your caregiver about any limitations on activity for underlying diseases you may have. SEEK MEDICAL CARE IF:   Your condition is not improving after your transfusion.  You develop redness or irritation at the intravenous (IV) site. SEEK IMMEDIATE MEDICAL CARE IF:  Any of the following symptoms occur over the next 12 hours:  Shaking chills.  You have a temperature by mouth above 102 F (38.9 C), not controlled by  medicine.  Chest, back, or muscle pain.  People around you feel you are not acting correctly or are confused.  Shortness of breath or difficulty breathing.  Dizziness and fainting.  You get a rash or develop hives.  You have a decrease in urine output.  Your urine turns a dark color or changes to pink, red, or brown. Any of the following symptoms occur over the next 10 days:  You have a temperature by mouth above 102 F (38.9 C), not controlled by medicine.  Shortness of breath.  Weakness after normal activity.  The white part of the eye turns yellow (jaundice).  You have a decrease in the amount of urine or are urinating less often.  Your urine turns a dark color or changes to pink, red, or brown. Document Released: 05/27/2000 Document Revised: 08/22/2011 Document Reviewed: 01/14/2008 [facility] Patient Information 2014 [place], [place].  _______________________________________________________________________

## 2019-01-08 NOTE — Progress Notes (Signed)
CARDIAC CLEARANCE , BHAGAT, PA AND MICHAEL COOPER 11-22-2018 Epic   LOV CARDS 08-08-2018 Epic   EKG 04-03-18 Epic   STRESS/ ECHO 2017 Epic   CARDIAC CATH 2018 Epic

## 2019-01-09 LAB — ABO/RH: ABO/RH(D): O POS

## 2019-01-10 ENCOUNTER — Other Ambulatory Visit (HOSPITAL_COMMUNITY)
Admission: RE | Admit: 2019-01-10 | Discharge: 2019-01-10 | Disposition: A | Discharge status: Home / Self Care | Payer: PPO | Source: Ambulatory Visit | Attending: Orthopedic Surgery | Admitting: Orthopedic Surgery

## 2019-01-10 DIAGNOSIS — Z01812 Encounter for preprocedural laboratory examination: Secondary | ICD-10-CM | POA: Diagnosis not present

## 2019-01-10 NOTE — Anesthesia Preprocedure Evaluation (Addendum)
Anesthesia Evaluation  Patient identified by MRN, date of birth, ID band Patient awake    Reviewed: Allergy & Precautions, NPO status , Patient's Chart, lab work & pertinent test results  Airway Mallampati: II  TM Distance: >3 FB Neck ROM: Full    Dental no notable dental hx.    Pulmonary sleep apnea and Continuous Positive Airway Pressure Ventilation , former smoker,    Pulmonary exam normal breath sounds clear to auscultation       Cardiovascular hypertension, Pt. on medications and Pt. on home beta blockers + CAD, + Cardiac Stents and + Peripheral Vascular Disease  Normal cardiovascular exam Rhythm:Regular Rate:Normal     Neuro/Psych negative neurological ROS  negative psych ROS   GI/Hepatic negative GI ROS, Neg liver ROS,   Endo/Other  negative endocrine ROS  Renal/GU negative Renal ROS  negative genitourinary   Musculoskeletal negative musculoskeletal ROS (+)   Abdominal   Peds negative pediatric ROS (+)  Hematology negative hematology ROS (+)   Anesthesia Other Findings   Reproductive/Obstetrics negative OB ROS                            Anesthesia Physical Anesthesia Plan  ASA: III  Anesthesia Plan: Spinal   Post-op Pain Management:  Regional for Post-op pain   Induction: Intravenous  PONV Risk Score and Plan: 2 and Ondansetron, Dexamethasone and Treatment may vary due to age or medical condition  Airway Management Planned: Simple Face Mask  Additional Equipment:   Intra-op Plan:   Post-operative Plan:   Informed Consent: I have reviewed the patients History and Physical, chart, labs and discussed the procedure including the risks, benefits and alternatives for the proposed anesthesia with the patient or authorized representative who has indicated his/her understanding and acceptance.     Dental advisory given  Plan Discussed with: CRNA and Surgeon  Anesthesia  Plan Comments: (See PAT note 01/08/2019, Konrad Felix, PA-C)       Anesthesia Quick Evaluation

## 2019-01-10 NOTE — Progress Notes (Signed)
Anesthesia Chart Review   Case: [id number] Date/Time: 01/14/19 0910   Procedure: TOTAL KNEE ARTHROPLASTY (Right ) - 37min   Anesthesia type: Choice   Pre-op diagnosis: right knee osteoarthritis   Location: [place] [facility] / [facility]   Surgeon: Gaynelle Arabian, MD      DISCUSSION:78 y.o. former smoker (40 pack years, quit 06/13/98) with h/o HTN, CAD (DES 2006, 2011), chronic diastolic heart failure, PFO, MVP, sleep apnea w/CPAP, hiatal hernia, PVD, Stroke 2010, right knee OA scheduled for above procedure 01/14/2019 with Dr. Gaynelle Arabian.   Cardiac clearance received 11/26/2018.  Per [employer], PA-C, "Unable to get any exercise due to knee issue but getting at least 4 mets of activity. Given past medical history and time since last visit, based on ACC/AHA guidelines, JOHARI BENNETTS would be at acceptable risk for the planned procedure without further cardiovascular testing."  Per Dr. Sherren Mocha, "OK to hold ASA for surgery as requested."  Anticipate pt can proceed with planned procedure barring acute status change.   VS: BP 123/65   Pulse (!) 57   Resp 16   Ht 5\' 7"  (1.702 m)   Wt 88.5 kg   SpO2 96%   BMI 30.54 kg/m   PROVIDERS: Caryl Bis, MD is PCP   Sherren Mocha, MD is Cardiologist  LABS: Labs reviewed: Acceptable for surgery. (all labs ordered are listed, but only abnormal results are displayed)  Labs Reviewed  CBC - Abnormal; Notable for the following components:      Result Value   RDW 16.2 (*)    All other components within normal limits  COMPREHENSIVE METABOLIC PANEL - Abnormal; Notable for the following components:   Glucose, Bld 104 (*)    All other components within normal limits  SURGICAL PCR SCREEN  APTT  PROTIME-INR  TYPE AND SCREEN  ABO/RH     IMAGES: VAS US Carotid 12/19/2016 Heterogeneous plaque, bilaterally. Stable 1-39% RICA stenosis. Essentially stable 9-93% LICA stenosis. >50% LECA stenosis.  Normal right subclavian artery. Patent left  CCA to subclavian artery bypass graft. Patent vertebral arteries with antegrade flow. Abnormal thyroid appearance (see above) - suggest dedicated thyroid US if clinically indicated. F/u 1 year  EKG: 04/03/18 Rate 59 bpm Sinus bradycardia  Otherwise normal ECG   CV: Cardiac Cath 04/10/2017  Widely patent stent in the mid RCA.  60% ostial stenosis in the continuation of the RCA beyond the PDA.  Patent LAD stent with distal third 60-70% in-stent restenosis.  90% first septal perforator.  30-40% mid circumflex  Low normal left ventricular systolic function with estimated EF 50%.  Normal LV hemodynamics.  Normal right heart pressures with mean pulmonary capillary wedge pressure of 10 mmHg.  RECOMMENDATIONS:   No high-grade coronary obstruction or abnormal hemodynamics to explain the severity of dyspnea.  The distal margin of the LAD stent is bordering on significant.  Needs to be followed over time for progression.  Myocardial Perfusion Imaging 12/01/15  Nuclear stress EF: 54%.  The left ventricular ejection fraction is mildly decreased (45-54%).  There was no ST segment deviation noted during stress.  No T wave inversion was noted during stress.  Defect 1: There is a small defect of moderate severity present in the apex location. Prior infarct vs. apical thinning. No ischemia.  The study is normal.    Echo 12/01/2015 Study Conclusions  - Left ventricle: The cavity size was normal. Wall thickness was   increased in a pattern of mild LVH. Systolic function  was normal.   The estimated ejection fraction was in the range of 55% to 60%.   Basal inferior hypokinesis. Doppler parameters are consistent   with abnormal left ventricular relaxation (grade 1 diastolic   dysfunction). - Aortic valve: There was no stenosis. - Mitral valve: Mildly calcified annulus. There was trivial   regurgitation. - Left atrium: The atrium was mildly dilated. - Right ventricle: The cavity  size was normal. Systolic function   was normal. - Right atrium: The atrium was mildly dilated. - Atrial septum: Possible PFO but not fully visualized. - Tricuspid valve: Peak RV-RA gradient (S): 27 mm Hg. - Pulmonary arteries: PA peak pressure: 30 mm Hg (S). - Inferior vena cava: The vessel was normal in size. The   respirophasic diameter changes were in the normal range (= 50%),   consistent with normal central venous pressure.  Impressions:  - Normal LV size with mild LV hypertrophy. EF 55-60%. Basal   inferior hypokinesis. Normal RV size and systolic function. No   significant valvular abnormalities. Possible PFO present but not   fully visualized. Past Medical History:  Diagnosis Date  . Achilles tendon rupture, right, initial encounter   . Allergic rhinitis, cause unspecified   . Anemia, unspecified   . Anxiety state, unspecified   . Atherosclerosis of aorta ([place])    TEE, June, 2010, grade 4 aortic arch atherosclerosis , Dr. Aundra Dubin  . CAD (coronary artery disease)    DES and circumflex 2006 /  DES to LAD 2011  . Carotid artery disease ([place])    Doppler, November, 2011, stable, 0-39% bilateral, turbulent flow left subclavian  . Chronic diastolic heart failure ([place])   . Cyst of thyroid   . Diverticulosis of colon (without mention of hemorrhage)   . Dysphasia    Possibly esophageal stricture  . Ejection fraction     EF 60%, Echo, November 12, 2008, /  EF 55%, TEE, November 17, 2008  . Esophageal reflux   . Hiatal hernia   . Hyperlipemia   . Hypertension   . Lumbago   . Lumbar disc disease   . MVP (mitral valve prolapse)   . Nonspecific abnormal finding in stool contents   . Obesity, unspecified   . Osteoarthrosis, unspecified whether generalized or localized, unspecified site   . Osteopenia   . Other diseases of lung, not elsewhere classified   . Palpitations    October, 2012  . Peripheral vascular disease, unspecified ([place])   . Personal history of colonic polyps     ADENOMATOUS POLYP  . PFO (patent foramen ovale)    Patient had a very small PFO by TEE 2010.  This was seen only by bubble analysis during Valsalva  . Shortness of breath   . Sleep apnea    uses CPAP nightly  . Sleep related hypoventilation/hypoxemia in conditions classifiable elsewhere   . Stroke [facility]) 11/2008   Treated with TPA? /     2010, TEE no left atrial clot, probably from atherosclerosis of the aortic arch  . 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 Victorino Dike.  Marland Kitchen Unspecified cerebral artery occlusion with cerebral infarction   . Unspecified venous (peripheral) insufficiency     Past Surgical History:  Procedure Laterality Date  . ACHILLES TENDON SURGERY Right 04/05/2018   Procedure: Right achilles tendon reconstruction;  Surgeon: Wylene Simmer, MD;  Location: [place];  Service: Orthopedics;  Laterality: Right;  16min  . BACK SURGERY    .  cataracts     both eyes  . CHOLECYSTECTOMY    . CORONARY ANGIOPLASTY WITH STENT PLACEMENT  1990's  . GASTROCNEMIUS RECESSION Right 04/05/2018   Procedure: Gastroc recession;  Surgeon: Wylene Simmer, MD;  Location: [place];  Service: Orthopedics;  Laterality: Right;  . LAPAROSCOPIC HYSTERECTOMY    . LUMBAR DISC SURGERY     X 3  . RIGHT/LEFT HEART CATH AND CORONARY ANGIOGRAPHY N/A 04/10/2017   Procedure: RIGHT/LEFT HEART CATH AND CORONARY ANGIOGRAPHY;  Surgeon: Belva Crome, MD;  Location: [place] [facility];  Service: Cardiovascular;  Laterality: N/A;  . urethral suspension  1993   Dr. Nori Riis  . VARICOSE VEIN SURGERY     x 2    MEDICATIONS: . amLODipine (NORVASC) 5 MG tablet  . aspirin EC 81 MG tablet  . buPROPion (WELLBUTRIN XL) 150 MG 24 hr tablet  . furosemide (LASIX) 40 MG tablet  . gabapentin (NEURONTIN) 300 MG capsule  . isosorbide mononitrate (IMDUR) 30 MG 24 hr tablet  . Melatonin 3 MG TABS  . metoprolol tartrate (LOPRESSOR) 50 MG tablet  . nitroGLYCERIN (NITROSTAT) 0.4 MG  SL tablet  . OXYGEN  . pantoprazole (PROTONIX) 40 MG tablet  . polyethylene glycol powder (GLYCOLAX/MIRALAX) powder  . polyvinyl alcohol (ARTIFICIAL TEARS) 1.4 % ophthalmic solution  . rosuvastatin (CRESTOR) 10 MG tablet   No current facility-administered medications for this encounter.

## 2019-01-11 DIAGNOSIS — I10 Essential (primary) hypertension: Secondary | ICD-10-CM | POA: Diagnosis not present

## 2019-01-11 DIAGNOSIS — E782 Mixed hyperlipidemia: Secondary | ICD-10-CM | POA: Diagnosis not present

## 2019-01-11 LAB — SARS CORONAVIRUS 2 (TAT 6-24 HRS): SARS Coronavirus 2: NEGATIVE

## 2019-01-13 MED ORDER — BUPIVACAINE LIPOSOME 1.3 % IJ SUSP
20.0000 mL | Freq: Once | INTRAMUSCULAR | Status: DC
  Filled 2019-01-13: qty 20

## 2019-01-14 ENCOUNTER — Inpatient Hospital Stay (HOSPITAL_COMMUNITY)
Admission: RE | Admit: 2019-01-14 | Discharge: 2019-01-16 | DRG: 470 | Disposition: A | Discharge status: Home / Self Care | Payer: PPO | Attending: Orthopedic Surgery | Admitting: Orthopedic Surgery

## 2019-01-14 ENCOUNTER — Encounter (HOSPITAL_COMMUNITY)
Admission: RE | Disposition: A | Discharge status: Home / Self Care | Payer: Self-pay | Source: Home / Self Care | Attending: Orthopedic Surgery

## 2019-01-14 ENCOUNTER — Encounter (HOSPITAL_COMMUNITY): Payer: Self-pay | Admitting: *Deleted

## 2019-01-14 ENCOUNTER — Inpatient Hospital Stay (HOSPITAL_COMMUNITY): Payer: PPO | Admitting: Physician Assistant

## 2019-01-14 ENCOUNTER — Inpatient Hospital Stay (HOSPITAL_COMMUNITY): Payer: PPO | Admitting: Anesthesiology

## 2019-01-14 ENCOUNTER — Other Ambulatory Visit: Payer: Self-pay

## 2019-01-14 DIAGNOSIS — I700 Atherosclerosis of aorta: Secondary | ICD-10-CM | POA: Diagnosis present

## 2019-01-14 DIAGNOSIS — Z955 Presence of coronary angioplasty implant and graft: Secondary | ICD-10-CM

## 2019-01-14 DIAGNOSIS — I739 Peripheral vascular disease, unspecified: Secondary | ICD-10-CM | POA: Diagnosis present

## 2019-01-14 DIAGNOSIS — Z9981 Dependence on supplemental oxygen: Secondary | ICD-10-CM | POA: Diagnosis not present

## 2019-01-14 DIAGNOSIS — Z90710 Acquired absence of both cervix and uterus: Secondary | ICD-10-CM

## 2019-01-14 DIAGNOSIS — E669 Obesity, unspecified: Secondary | ICD-10-CM | POA: Diagnosis not present

## 2019-01-14 DIAGNOSIS — Z79890 Hormone replacement therapy (postmenopausal): Secondary | ICD-10-CM | POA: Diagnosis not present

## 2019-01-14 DIAGNOSIS — M8580 Other specified disorders of bone density and structure, unspecified site: Secondary | ICD-10-CM | POA: Diagnosis not present

## 2019-01-14 DIAGNOSIS — Z79899 Other long term (current) drug therapy: Secondary | ICD-10-CM

## 2019-01-14 DIAGNOSIS — Z8249 Family history of ischemic heart disease and other diseases of the circulatory system: Secondary | ICD-10-CM

## 2019-01-14 DIAGNOSIS — K219 Gastro-esophageal reflux disease without esophagitis: Secondary | ICD-10-CM | POA: Diagnosis present

## 2019-01-14 DIAGNOSIS — Z803 Family history of malignant neoplasm of breast: Secondary | ICD-10-CM

## 2019-01-14 DIAGNOSIS — G4730 Sleep apnea, unspecified: Secondary | ICD-10-CM | POA: Diagnosis not present

## 2019-01-14 DIAGNOSIS — M25761 Osteophyte, right knee: Secondary | ICD-10-CM | POA: Diagnosis present

## 2019-01-14 DIAGNOSIS — I110 Hypertensive heart disease with heart failure: Secondary | ICD-10-CM | POA: Diagnosis present

## 2019-01-14 DIAGNOSIS — I2510 Atherosclerotic heart disease of native coronary artery without angina pectoris: Secondary | ICD-10-CM | POA: Diagnosis present

## 2019-01-14 DIAGNOSIS — Z87891 Personal history of nicotine dependence: Secondary | ICD-10-CM

## 2019-01-14 DIAGNOSIS — I10 Essential (primary) hypertension: Secondary | ICD-10-CM | POA: Diagnosis not present

## 2019-01-14 DIAGNOSIS — M797 Fibromyalgia: Secondary | ICD-10-CM | POA: Diagnosis not present

## 2019-01-14 DIAGNOSIS — M1711 Unilateral primary osteoarthritis, right knee: Secondary | ICD-10-CM | POA: Diagnosis not present

## 2019-01-14 DIAGNOSIS — E785 Hyperlipidemia, unspecified: Secondary | ICD-10-CM | POA: Diagnosis present

## 2019-01-14 DIAGNOSIS — F411 Generalized anxiety disorder: Secondary | ICD-10-CM | POA: Diagnosis present

## 2019-01-14 DIAGNOSIS — M25561 Pain in right knee: Secondary | ICD-10-CM | POA: Diagnosis not present

## 2019-01-14 DIAGNOSIS — I341 Nonrheumatic mitral (valve) prolapse: Secondary | ICD-10-CM | POA: Diagnosis present

## 2019-01-14 DIAGNOSIS — K5730 Diverticulosis of large intestine without perforation or abscess without bleeding: Secondary | ICD-10-CM | POA: Diagnosis present

## 2019-01-14 DIAGNOSIS — Z8673 Personal history of transient ischemic attack (TIA), and cerebral infarction without residual deficits: Secondary | ICD-10-CM

## 2019-01-14 DIAGNOSIS — Z888 Allergy status to other drugs, medicaments and biological substances status: Secondary | ICD-10-CM

## 2019-01-14 DIAGNOSIS — M1710 Unilateral primary osteoarthritis, unspecified knee: Secondary | ICD-10-CM | POA: Diagnosis present

## 2019-01-14 DIAGNOSIS — M179 Osteoarthritis of knee, unspecified: Secondary | ICD-10-CM

## 2019-01-14 DIAGNOSIS — Z6832 Body mass index (BMI) 32.0-32.9, adult: Secondary | ICD-10-CM | POA: Diagnosis not present

## 2019-01-14 DIAGNOSIS — J309 Allergic rhinitis, unspecified: Secondary | ICD-10-CM | POA: Diagnosis present

## 2019-01-14 DIAGNOSIS — Z7982 Long term (current) use of aspirin: Secondary | ICD-10-CM | POA: Diagnosis not present

## 2019-01-14 DIAGNOSIS — Z96651 Presence of right artificial knee joint: Secondary | ICD-10-CM | POA: Diagnosis not present

## 2019-01-14 DIAGNOSIS — I5032 Chronic diastolic (congestive) heart failure: Secondary | ICD-10-CM | POA: Diagnosis present

## 2019-01-14 DIAGNOSIS — Z885 Allergy status to narcotic agent status: Secondary | ICD-10-CM

## 2019-01-14 DIAGNOSIS — M419 Scoliosis, unspecified: Secondary | ICD-10-CM | POA: Diagnosis not present

## 2019-01-14 DIAGNOSIS — G8918 Other acute postprocedural pain: Secondary | ICD-10-CM | POA: Diagnosis not present

## 2019-01-14 DIAGNOSIS — Z9049 Acquired absence of other specified parts of digestive tract: Secondary | ICD-10-CM

## 2019-01-14 DIAGNOSIS — Z800 Family history of malignant neoplasm of digestive organs: Secondary | ICD-10-CM

## 2019-01-14 DIAGNOSIS — E7800 Pure hypercholesterolemia, unspecified: Secondary | ICD-10-CM | POA: Diagnosis not present

## 2019-01-14 HISTORY — PX: TOTAL KNEE ARTHROPLASTY: SHX125

## 2019-01-14 LAB — TYPE AND SCREEN
ABO/RH(D): O POS
Antibody Screen: NEGATIVE

## 2019-01-14 SURGERY — ARTHROPLASTY, KNEE, TOTAL
Anesthesia: Spinal | Laterality: Right

## 2019-01-14 MED ORDER — BUPIVACAINE IN DEXTROSE 0.75-8.25 % IT SOLN
INTRATHECAL | Status: DC | PRN
  Administered 2019-01-14: 1.6 mL via INTRATHECAL

## 2019-01-14 MED ORDER — FENTANYL CITRATE (PF) 100 MCG/2ML IJ SOLN
50.0000 ug | Freq: Once | INTRAMUSCULAR | Status: AC
  Administered 2019-01-14: 75 ug via INTRAVENOUS
  Filled 2019-01-14: qty 2

## 2019-01-14 MED ORDER — RIVAROXABAN 10 MG PO TABS
10.0000 mg | ORAL_TABLET | Freq: Every day | ORAL | Status: DC
  Administered 2019-01-15 – 2019-01-16 (×2): 10 mg via ORAL
  Filled 2019-01-14 (×2): qty 1

## 2019-01-14 MED ORDER — PHENYLEPHRINE HCL (PRESSORS) 10 MG/ML IV SOLN
INTRAVENOUS | Status: AC
  Filled 2019-01-14: qty 1

## 2019-01-14 MED ORDER — OXYCODONE HCL 5 MG PO TABS
ORAL_TABLET | ORAL | Status: AC
  Administered 2019-01-14: 5 mg via ORAL
  Filled 2019-01-14: qty 1

## 2019-01-14 MED ORDER — BUPIVACAINE LIPOSOME 1.3 % IJ SUSP
INTRAMUSCULAR | Status: DC | PRN
  Administered 2019-01-14: 20 mL

## 2019-01-14 MED ORDER — DIPHENHYDRAMINE HCL 12.5 MG/5ML PO ELIX
12.5000 mg | ORAL_SOLUTION | ORAL | Status: DC | PRN
  Administered 2019-01-14 – 2019-01-16 (×5): 25 mg via ORAL
  Filled 2019-01-14 (×5): qty 10

## 2019-01-14 MED ORDER — POVIDONE-IODINE 10 % EX SWAB
2.0000 "application " | Freq: Once | CUTANEOUS | Status: AC
  Administered 2019-01-14: 2 via TOPICAL

## 2019-01-14 MED ORDER — FLEET ENEMA 7-19 GM/118ML RE ENEM: 1.0000 | ENEMA | Freq: Once | RECTAL | Status: DC | PRN

## 2019-01-14 MED ORDER — ONDANSETRON HCL 4 MG/2ML IJ SOLN: 4.0000 mg | Freq: Four times a day (QID) | INTRAMUSCULAR | Status: DC | PRN

## 2019-01-14 MED ORDER — POLYETHYLENE GLYCOL 3350 17 G PO PACK: 17.0000 g | PACK | Freq: Every day | ORAL | Status: DC | PRN

## 2019-01-14 MED ORDER — SODIUM CHLORIDE 0.9 % IV SOLN
INTRAVENOUS | Status: DC
  Administered 2019-01-14: 14:00:00 via INTRAVENOUS

## 2019-01-14 MED ORDER — METOCLOPRAMIDE HCL 5 MG PO TABS: 5.0000 mg | ORAL_TABLET | Freq: Three times a day (TID) | ORAL | Status: DC | PRN

## 2019-01-14 MED ORDER — CHLORHEXIDINE GLUCONATE 4 % EX LIQD: 60.0000 mL | Freq: Once | CUTANEOUS | Status: DC

## 2019-01-14 MED ORDER — MORPHINE SULFATE (PF) 4 MG/ML IV SOLN
INTRAVENOUS | Status: AC
  Filled 2019-01-14: qty 1

## 2019-01-14 MED ORDER — METHOCARBAMOL 500 MG PO TABS
500.0000 mg | ORAL_TABLET | Freq: Four times a day (QID) | ORAL | Status: DC | PRN
  Administered 2019-01-14 – 2019-01-16 (×3): 500 mg via ORAL
  Filled 2019-01-14 (×3): qty 1

## 2019-01-14 MED ORDER — DEXAMETHASONE SODIUM PHOSPHATE 10 MG/ML IJ SOLN
8.0000 mg | Freq: Once | INTRAMUSCULAR | Status: AC
  Administered 2019-01-14: 8 mg via INTRAVENOUS

## 2019-01-14 MED ORDER — SODIUM CHLORIDE 0.9 % IR SOLN
Status: DC | PRN
  Administered 2019-01-14 (×2): 1000 mL

## 2019-01-14 MED ORDER — EPHEDRINE SULFATE-NACL 50-0.9 MG/10ML-% IV SOSY
PREFILLED_SYRINGE | INTRAVENOUS | Status: DC | PRN
  Administered 2019-01-14 (×3): 10 mg via INTRAVENOUS

## 2019-01-14 MED ORDER — ONDANSETRON HCL 4 MG/2ML IJ SOLN
INTRAMUSCULAR | Status: AC
  Filled 2019-01-14: qty 2

## 2019-01-14 MED ORDER — MENTHOL 3 MG MT LOZG: 1.0000 | LOZENGE | OROMUCOSAL | Status: DC | PRN

## 2019-01-14 MED ORDER — NITROGLYCERIN 0.4 MG SL SUBL: 0.4000 mg | SUBLINGUAL_TABLET | SUBLINGUAL | Status: DC | PRN

## 2019-01-14 MED ORDER — DEXAMETHASONE SODIUM PHOSPHATE 10 MG/ML IJ SOLN
INTRAMUSCULAR | Status: AC
  Filled 2019-01-14: qty 1

## 2019-01-14 MED ORDER — ACETAMINOPHEN 10 MG/ML IV SOLN
1000.0000 mg | Freq: Four times a day (QID) | INTRAVENOUS | Status: DC
  Administered 2019-01-14: 1000 mg via INTRAVENOUS
  Filled 2019-01-14: qty 100

## 2019-01-14 MED ORDER — PROPOFOL 10 MG/ML IV BOLUS
INTRAVENOUS | Status: AC
  Filled 2019-01-14: qty 60

## 2019-01-14 MED ORDER — METHOCARBAMOL 500 MG IVPB - SIMPLE MED
500.0000 mg | Freq: Four times a day (QID) | INTRAVENOUS | Status: DC | PRN
  Administered 2019-01-14: 500 mg via INTRAVENOUS
  Filled 2019-01-14: qty 50

## 2019-01-14 MED ORDER — CEFAZOLIN SODIUM-DEXTROSE 2-4 GM/100ML-% IV SOLN
2.0000 g | Freq: Four times a day (QID) | INTRAVENOUS | Status: AC
  Administered 2019-01-14 (×2): 2 g via INTRAVENOUS
  Filled 2019-01-14 (×2): qty 100

## 2019-01-14 MED ORDER — CLONIDINE HCL (ANALGESIA) 100 MCG/ML EP SOLN
EPIDURAL | Status: DC | PRN
  Administered 2019-01-14: 70 ug

## 2019-01-14 MED ORDER — METOCLOPRAMIDE HCL 5 MG/ML IJ SOLN
5.0000 mg | Freq: Three times a day (TID) | INTRAMUSCULAR | Status: DC | PRN
  Administered 2019-01-14: 10 mg via INTRAVENOUS
  Filled 2019-01-14: qty 2

## 2019-01-14 MED ORDER — SODIUM CHLORIDE (PF) 0.9 % IJ SOLN
INTRAMUSCULAR | Status: DC | PRN
  Administered 2019-01-14: 60 mL

## 2019-01-14 MED ORDER — PROPOFOL 500 MG/50ML IV EMUL
INTRAVENOUS | Status: DC | PRN
  Administered 2019-01-14: 80 ug/kg/min via INTRAVENOUS

## 2019-01-14 MED ORDER — HYDROMORPHONE HCL 1 MG/ML IJ SOLN: 0.2500 mg | INTRAMUSCULAR | Status: DC | PRN

## 2019-01-14 MED ORDER — METOPROLOL TARTRATE 50 MG PO TABS
50.0000 mg | ORAL_TABLET | Freq: Two times a day (BID) | ORAL | Status: DC
  Administered 2019-01-14 – 2019-01-15 (×3): 50 mg via ORAL
  Filled 2019-01-14 (×5): qty 1

## 2019-01-14 MED ORDER — BUPROPION HCL ER (XL) 300 MG PO TB24
300.0000 mg | ORAL_TABLET | Freq: Every day | ORAL | Status: DC
  Administered 2019-01-15 – 2019-01-16 (×2): 300 mg via ORAL
  Filled 2019-01-14 (×2): qty 1

## 2019-01-14 MED ORDER — ONDANSETRON HCL 4 MG/2ML IJ SOLN
INTRAMUSCULAR | Status: DC | PRN
  Administered 2019-01-14: 4 mg via INTRAVENOUS

## 2019-01-14 MED ORDER — CEFAZOLIN SODIUM-DEXTROSE 2-4 GM/100ML-% IV SOLN
2.0000 g | INTRAVENOUS | Status: AC
  Administered 2019-01-14: 2 g via INTRAVENOUS
  Filled 2019-01-14: qty 100

## 2019-01-14 MED ORDER — FUROSEMIDE 40 MG PO TABS
40.0000 mg | ORAL_TABLET | Freq: Every day | ORAL | Status: DC
  Administered 2019-01-15 – 2019-01-16 (×2): 40 mg via ORAL
  Filled 2019-01-14 (×2): qty 1

## 2019-01-14 MED ORDER — ROSUVASTATIN CALCIUM 10 MG PO TABS
10.0000 mg | ORAL_TABLET | Freq: Every day | ORAL | Status: DC
  Administered 2019-01-14 – 2019-01-15 (×2): 10 mg via ORAL
  Filled 2019-01-14 (×2): qty 1

## 2019-01-14 MED ORDER — MORPHINE SULFATE (PF) 4 MG/ML IV SOLN
1.0000 mg | INTRAVENOUS | Status: DC | PRN
  Administered 2019-01-14 (×2): 1 mg via INTRAVENOUS
  Filled 2019-01-14: qty 1

## 2019-01-14 MED ORDER — PROMETHAZINE HCL 25 MG/ML IJ SOLN: 6.2500 mg | INTRAMUSCULAR | Status: DC | PRN

## 2019-01-14 MED ORDER — DEXAMETHASONE SODIUM PHOSPHATE 10 MG/ML IJ SOLN
10.0000 mg | Freq: Once | INTRAMUSCULAR | Status: AC
  Administered 2019-01-15: 10 mg via INTRAVENOUS
  Filled 2019-01-14: qty 1

## 2019-01-14 MED ORDER — SODIUM CHLORIDE (PF) 0.9 % IJ SOLN
INTRAMUSCULAR | Status: AC
  Filled 2019-01-14: qty 10

## 2019-01-14 MED ORDER — ROPIVACAINE HCL 5 MG/ML IJ SOLN
INTRAMUSCULAR | Status: DC | PRN
  Administered 2019-01-14: 20 mL via PERINEURAL

## 2019-01-14 MED ORDER — SODIUM CHLORIDE (PF) 0.9 % IJ SOLN
INTRAMUSCULAR | Status: AC
  Filled 2019-01-14: qty 50

## 2019-01-14 MED ORDER — PANTOPRAZOLE SODIUM 40 MG PO TBEC
40.0000 mg | DELAYED_RELEASE_TABLET | Freq: Two times a day (BID) | ORAL | Status: DC
  Administered 2019-01-14 – 2019-01-16 (×4): 40 mg via ORAL
  Filled 2019-01-14 (×4): qty 1

## 2019-01-14 MED ORDER — METHOCARBAMOL 500 MG IVPB - SIMPLE MED
INTRAVENOUS | Status: AC
  Filled 2019-01-14: qty 50

## 2019-01-14 MED ORDER — ISOSORBIDE MONONITRATE ER 30 MG PO TB24
30.0000 mg | ORAL_TABLET | Freq: Every day | ORAL | Status: DC
  Administered 2019-01-14 – 2019-01-15 (×2): 30 mg via ORAL
  Filled 2019-01-14 (×2): qty 1

## 2019-01-14 MED ORDER — DOCUSATE SODIUM 100 MG PO CAPS
100.0000 mg | ORAL_CAPSULE | Freq: Two times a day (BID) | ORAL | Status: DC
  Administered 2019-01-14 – 2019-01-16 (×4): 100 mg via ORAL
  Filled 2019-01-14 (×4): qty 1

## 2019-01-14 MED ORDER — ACETAMINOPHEN 500 MG PO TABS
1000.0000 mg | ORAL_TABLET | Freq: Four times a day (QID) | ORAL | Status: AC
  Administered 2019-01-14 – 2019-01-15 (×4): 1000 mg via ORAL
  Filled 2019-01-14 (×5): qty 2

## 2019-01-14 MED ORDER — LACTATED RINGERS IV SOLN
INTRAVENOUS | Status: DC
  Administered 2019-01-14 (×2): via INTRAVENOUS

## 2019-01-14 MED ORDER — OXYCODONE HCL 5 MG PO TABS
10.0000 mg | ORAL_TABLET | ORAL | Status: DC | PRN
  Administered 2019-01-14 – 2019-01-16 (×6): 10 mg via ORAL
  Filled 2019-01-14 (×7): qty 2

## 2019-01-14 MED ORDER — BISACODYL 10 MG RE SUPP: 10.0000 mg | Freq: Every day | RECTAL | Status: DC | PRN

## 2019-01-14 MED ORDER — OXYCODONE HCL 5 MG PO TABS
5.0000 mg | ORAL_TABLET | ORAL | Status: DC | PRN
  Administered 2019-01-14 – 2019-01-16 (×3): 5 mg via ORAL
  Filled 2019-01-14 (×2): qty 1

## 2019-01-14 MED ORDER — ONDANSETRON HCL 4 MG PO TABS: 4.0000 mg | ORAL_TABLET | Freq: Four times a day (QID) | ORAL | Status: DC | PRN

## 2019-01-14 MED ORDER — PHENOL 1.4 % MT LIQD
1.0000 | OROMUCOSAL | Status: DC | PRN
  Filled 2019-01-14: qty 177

## 2019-01-14 MED ORDER — TRANEXAMIC ACID-NACL 1000-0.7 MG/100ML-% IV SOLN
1000.0000 mg | INTRAVENOUS | Status: AC
  Administered 2019-01-14: 1000 mg via INTRAVENOUS
  Filled 2019-01-14: qty 100

## 2019-01-14 MED ORDER — AMLODIPINE BESYLATE 5 MG PO TABS
5.0000 mg | ORAL_TABLET | Freq: Every day | ORAL | Status: DC
  Administered 2019-01-15: 5 mg via ORAL
  Filled 2019-01-14 (×2): qty 1

## 2019-01-14 MED ORDER — GABAPENTIN 300 MG PO CAPS
600.0000 mg | ORAL_CAPSULE | Freq: Three times a day (TID) | ORAL | Status: DC
  Administered 2019-01-14 – 2019-01-16 (×7): 600 mg via ORAL
  Filled 2019-01-14 (×7): qty 2

## 2019-01-14 MED ORDER — MIDAZOLAM HCL 2 MG/2ML IJ SOLN
1.0000 mg | Freq: Once | INTRAMUSCULAR | Status: DC
  Filled 2019-01-14: qty 2

## 2019-01-14 SURGICAL SUPPLY — 59 items
BAG SPEC THK2 15X12 ZIP CLS (MISCELLANEOUS) ×1
BAG ZIPLOCK 12X15 (MISCELLANEOUS) ×3 IMPLANT
BLADE SAG 18X100X1.27 (BLADE) ×3 IMPLANT
BLADE SAW SGTL 11.0X1.19X90.0M (BLADE) ×3 IMPLANT
BLADE SURG SZ10 CARB STEEL (BLADE) ×6 IMPLANT
BNDG ELASTIC 6X5.8 VLCR STR LF (GAUZE/BANDAGES/DRESSINGS) ×3 IMPLANT
BOWL SMART MIX CTS (DISPOSABLE) ×3 IMPLANT
CEMENT HV SMART SET (Cement) ×6 IMPLANT
CEMENT TIBIA MBT (Knees) IMPLANT
CLOSURE WOUND 1/2 X4 (GAUZE/BANDAGES/DRESSINGS) ×2
COVER SURGICAL LIGHT HANDLE (MISCELLANEOUS) ×3 IMPLANT
COVER WAND RF STERILE (DRAPES) IMPLANT
CUFF TOURN SGL QUICK 34 (TOURNIQUET CUFF) ×3
CUFF TRNQT CYL 34X4.125X (TOURNIQUET CUFF) ×1 IMPLANT
DECANTER SPIKE VIAL GLASS SM (MISCELLANEOUS) ×5 IMPLANT
DRAPE U-SHAPE 47X51 STRL (DRAPES) ×3 IMPLANT
DRSG ADAPTIC 3X8 NADH LF (GAUZE/BANDAGES/DRESSINGS) ×3 IMPLANT
DRSG PAD ABDOMINAL 8X10 ST (GAUZE/BANDAGES/DRESSINGS) ×3 IMPLANT
DURAPREP 26ML APPLICATOR (WOUND CARE) ×3 IMPLANT
ELECT REM PT RETURN 15FT ADLT (MISCELLANEOUS) ×3 IMPLANT
EVACUATOR 1/8 PVC DRAIN (DRAIN) ×3 IMPLANT
FEMUR SIGMA PS SZ 3.0 R (Femur) ×2 IMPLANT
GAUZE SPONGE 4X4 12PLY STRL (GAUZE/BANDAGES/DRESSINGS) ×3 IMPLANT
GLOVE BIO SURGEON STRL SZ7 (GLOVE) ×3 IMPLANT
GLOVE BIO SURGEON STRL SZ8 (GLOVE) ×3 IMPLANT
GLOVE BIOGEL PI IND STRL 6.5 (GLOVE) ×1 IMPLANT
GLOVE BIOGEL PI IND STRL 7.0 (GLOVE) ×1 IMPLANT
GLOVE BIOGEL PI IND STRL 8 (GLOVE) ×1 IMPLANT
GLOVE BIOGEL PI INDICATOR 6.5 (GLOVE) ×2
GLOVE BIOGEL PI INDICATOR 7.0 (GLOVE) ×2
GLOVE BIOGEL PI INDICATOR 8 (GLOVE) ×2
GLOVE SURG SS PI 6.5 STRL IVOR (GLOVE) ×3 IMPLANT
GOWN STRL REUS W/TWL LRG LVL3 (GOWN DISPOSABLE) ×9 IMPLANT
HANDPIECE INTERPULSE COAX TIP (DISPOSABLE) ×3
HOLDER FOLEY CATH W/STRAP (MISCELLANEOUS) ×2 IMPLANT
IMMOBILIZER KNEE 20 (SOFTGOODS) ×3
IMMOBILIZER KNEE 20 THIGH 36 (SOFTGOODS) ×1 IMPLANT
INSERT TIBIAL PFC SIG SZ3 10MM (Knees) ×2 IMPLANT
KIT TURNOVER KIT A (KITS) IMPLANT
MANIFOLD NEPTUNE II (INSTRUMENTS) ×3 IMPLANT
NS IRRIG 1000ML POUR BTL (IV SOLUTION) ×3 IMPLANT
PACK TOTAL KNEE CUSTOM (KITS) ×3 IMPLANT
PADDING CAST COTTON 6X4 STRL (CAST SUPPLIES) ×7 IMPLANT
PATELLA DOME PFC 35MM (Knees) ×2 IMPLANT
PIN STEINMAN FIXATION KNEE (PIN) ×2 IMPLANT
PROTECTOR NERVE ULNAR (MISCELLANEOUS) ×3 IMPLANT
SET HNDPC FAN SPRY TIP SCT (DISPOSABLE) ×1 IMPLANT
STRIP CLOSURE SKIN 1/2X4 (GAUZE/BANDAGES/DRESSINGS) ×4 IMPLANT
SUT MNCRL AB 4-0 PS2 18 (SUTURE) ×3 IMPLANT
SUT STRATAFIX 0 PDS 27 VIOLET (SUTURE) ×3
SUT VIC AB 2-0 CT1 27 (SUTURE) ×9
SUT VIC AB 2-0 CT1 TAPERPNT 27 (SUTURE) ×3 IMPLANT
SUTURE STRATFX 0 PDS 27 VIOLET (SUTURE) ×1 IMPLANT
TIBIA MBT CEMENT (Knees) ×3 IMPLANT
TRAY FOLEY MTR SLVR 14FR STAT (SET/KITS/TRAYS/PACK) ×2 IMPLANT
TRAY FOLEY MTR SLVR 16FR STAT (SET/KITS/TRAYS/PACK) ×1 IMPLANT
WATER STERILE IRR 1000ML POUR (IV SOLUTION) ×6 IMPLANT
WRAP KNEE MAXI GEL POST OP (GAUZE/BANDAGES/DRESSINGS) ×3 IMPLANT
YANKAUER SUCT BULB TIP 10FT TU (MISCELLANEOUS) ×3 IMPLANT

## 2019-01-14 NOTE — Interval H&P Note (Signed)
History and Physical Interval Note:  01/14/2019 7:12 AM  Stephanie Alvarado  has presented today for surgery, with the diagnosis of right knee osteoarthritis.  The various methods of treatment have been discussed with the patient and family. After consideration of risks, benefits and other options for treatment, the patient has consented to  Procedure(s) with comments: TOTAL KNEE ARTHROPLASTY (Right) - 83min as a surgical intervention.  The patient's history has been reviewed, patient examined, no change in status, stable for surgery.  I have reviewed the patient's chart and labs.  Questions were answered to the patient's satisfaction.     Pilar Plate Eyad Rochford

## 2019-01-14 NOTE — Anesthesia Postprocedure Evaluation (Signed)
Anesthesia Post Note  Patient: Stephanie Alvarado  Procedure(s) Performed: TOTAL KNEE ARTHROPLASTY (Right )     Patient location during evaluation: PACU Anesthesia Type: Spinal Level of consciousness: oriented and awake and alert Pain management: pain level controlled Vital Signs Assessment: post-procedure vital signs reviewed and stable Respiratory status: spontaneous breathing, respiratory function stable and patient connected to nasal cannula oxygen Cardiovascular status: blood pressure returned to baseline and stable Postop Assessment: no headache, no backache and no apparent nausea or vomiting Anesthetic complications: no    Last Vitals:  Vitals:   01/14/19 1145 01/14/19 1204  BP: (!) 111/56 117/80  Pulse: 60 64  Resp: 20 15  Temp: (!) 36.4 C   SpO2: 100% 92%    Last Pain:  Vitals:   01/14/19 0704  TempSrc: Oral    LLE Motor Response: Responds to commands (01/14/19 1204) LLE Sensation: Numbness (01/14/19 1204) RLE Motor Response: Responds to sound (01/14/19 1204) RLE Sensation: Numbness (01/14/19 1204) L Sensory Level: L4-Anterior knee, lower leg (01/14/19 1204) R Sensory Level: L4-Anterior knee, lower leg (01/14/19 1204)  Yadir Zentner S

## 2019-01-14 NOTE — Anesthesia Procedure Notes (Signed)
Spinal  Patient location during procedure: OR Start time: 01/14/2019 9:23 AM Staffing Anesthesiologist: Myrtie Soman, MD Resident/CRNA: [place] Kristyne Woodring C, CRNA Performed: anesthesiologist  Preanesthetic Checklist Completed: patient identified, site marked, surgical consent, pre-op evaluation, timeout performed, IV checked, risks and benefits discussed and monitors and equipment checked Spinal Block Patient position: sitting Prep: Betadine Patient monitoring: heart rate, continuous pulse ox and blood pressure Approach: midline Location: L2-3 Injection technique: single-shot Needle Needle type: Pencan  Needle gauge: 24 G Needle length: 9 cm Assessment Sensory level: T6 Additional Notes Expiration date of kit checked and confirmed. Patient tolerated procedure well, without complications. CSF Aspiration before and after injection.  Attempt x 1 by S [place] Attempt x 1 by Rodell Perna successful

## 2019-01-14 NOTE — Evaluation (Signed)
Physical Therapy Evaluation Patient Details Name: NILSA MACHT MRN: [id number] DOB: [birth year] Today's Date: 01/14/2019   History of Present Illness  s/p R TKA; PMH: HTM, CHF, anxiety, CVA without sequelae  Clinical Impression  Pt is s/p TKA resulting in the deficits listed below (see PT Problem List).  Pt amb 40' with RW and min assist. Will continue to follow in acute setting. Pt will benefit from skilled PT to increase their independence and safety with mobility to allow discharge to the venue listed below.      Follow Up Recommendations  follow surgeon's rec    Equipment Recommendations  Rolling walker with 5" wheels    Recommendations for Other Services       Precautions / Restrictions Precautions Precautions: Fall;Knee Required Braces or Orthoses: Knee Immobilizer - Right Knee Immobilizer - Right: Discontinue once straight leg raise with < 10 degree lag Restrictions Weight Bearing Restrictions: No Other Position/Activity Restrictions: WBAT      Mobility  Bed Mobility Overal bed mobility: Needs Assistance Bed Mobility: Supine to Sit     Supine to sit: Min assist     General bed mobility comments: assist with  RLE  Transfers Overall transfer level: Needs assistance Equipment used: Rolling walker (2 wheeled) Transfers: Sit to/from Stand Sit to Stand: Min assist         General transfer comment: assist to rise and transition to RW, cues for hand placement  Ambulation/Gait Ambulation/Gait assistance: Min assist Gait Distance (Feet): 40 Feet Assistive device: Rolling walker (2 wheeled) Gait Pattern/deviations: Step-to pattern     General Gait Details: cues for sequence and RW position  Stairs            Wheelchair Mobility    Modified Rankin (Stroke Patients Only)       Balance                                             Pertinent Vitals/Pain Pain Assessment: 0-10 Pain Score: 4  Pain Location: right knee Pain  Descriptors / Indicators: Aching;Grimacing;Sore Pain Intervention(s): Limited activity within patient's tolerance;Monitored during session;Repositioned;Ice applied    Home Living Family/patient expects to be discharged to:: Private residence     Type of Home: House(townhouse) Home Access: Stairs to enter     Home Layout: One level Home Equipment: Cane - single point;Bedside commode      Prior Function Level of Independence: Independent;Independent with assistive device(s)         Comments: endorses falls in March and April; ambulates with cane     Hand Dominance        Extremity/Trunk Assessment   Upper Extremity Assessment Upper Extremity Assessment: Overall WFL for tasks assessed    Lower Extremity Assessment Lower Extremity Assessment: RLE deficits/detail RLE Deficits / Details: ankle WFL, knee extension and hip flexion  2+/5; knee flexion grossly 6 to 55 degrees flexion       Communication   Communication: No difficulties  Cognition Arousal/Alertness: Awake/alert Behavior During Therapy: WFL for tasks assessed/performed Overall Cognitive Status: Within Functional Limits for tasks assessed                                        General Comments      Exercises Total Joint Exercises Ankle Circles/Pumps:  AROM;Both;10 reps Quad Sets: Both;5 reps   Assessment/Plan    PT Assessment Patient needs continued PT services  PT Problem List Decreased strength;Decreased mobility;Decreased range of motion;Decreased activity tolerance;Pain;Decreased knowledge of use of DME       PT Treatment Interventions Stair training;Gait training;DME instruction;Therapeutic exercise;Functional mobility training;Therapeutic activities;Patient/family education    PT Goals (Current goals can be found in the Care Plan section)  Acute Rehab PT Goals PT Goal Formulation: With patient Time For Goal Achievement: 01/21/19 Potential to Achieve Goals: Good     Frequency 7X/week   Barriers to discharge        Co-evaluation               AM-PAC PT "6 Clicks" Mobility  Outcome Measure Help needed turning from your back to your side while in a flat bed without using bedrails?: A Little Help needed moving from lying on your back to sitting on the side of a flat bed without using bedrails?: A Little Help needed moving to and from a bed to a chair (including a wheelchair)?: A Little Help needed standing up from a chair using your arms (e.g., wheelchair or bedside chair)?: A Little Help needed to walk in hospital room?: A Little Help needed climbing 3-5 steps with a railing? : A Lot 6 Click Score: 17    End of Session Equipment Utilized During Treatment: Gait belt;Right knee immobilizer Activity Tolerance: Patient tolerated treatment well Patient left: in chair;with chair alarm set;with call bell/phone within reach   PT Visit Diagnosis: Difficulty in walking, not elsewhere classified (R26.2)    Time: 2956-2130 PT Time Calculation (min) (ACUTE ONLY): 24 min   Charges:   PT Evaluation $PT Eval Low Complexity: 1 Low PT Treatments $Gait Training: 8-22 mins        Kenyon Ana, PT  Pager: [phone] Acute Rehab Dept [facility]): [phone]   01/14/2019   [facility] 01/14/2019, 6:16 PM

## 2019-01-14 NOTE — Op Note (Signed)
OPERATIVE REPORT-TOTAL KNEE ARTHROPLASTY   Pre-operative diagnosis- Osteoarthritis  Right knee(s)  Post-operative diagnosis- Osteoarthritis Right knee(s)  Procedure-  Right  Total Knee Arthroplasty  Surgeon- Stephanie Plover. Aryssa Rosamond, MD  Assistant- Ardeen Jourdain, PA-C   Anesthesia-  Adductor canal block and spinal  EBL-50 mL   Drains Hemovac  Tourniquet time-  Total Tourniquet Time Documented: Thigh (Right) - 32 minutes Total: Thigh (Right) - 32 minutes     Complications- None  Condition-PACU - hemodynamically stable.   Brief Clinical Note  Stephanie Alvarado is a 78 y.o. year old female with end stage OA of her right knee with progressively worsening pain and dysfunction. She has constant pain, with activity and at rest and significant functional deficits with difficulties even with ADLs. She has had extensive non-op management including analgesics, injections of cortisone and viscosupplements, and home exercise program, but remains in significant pain with significant dysfunction.Radiographs show bone on bone arthritis medial and patellofemoral. She presents now for right Total Knee Arthroplasty.    Procedure in detail---   The patient is brought into the operating room and positioned supine on the operating table. After successful administration of  Adductor canal block and spinal,   a tourniquet is placed high on the  Right thigh(s) and the lower extremity is prepped and draped in the usual sterile fashion. Time out is performed by the operating team and then the  Right lower extremity is wrapped in Esmarch, knee flexed and the tourniquet inflated to 300 mmHg.       A midline incision is made with a ten blade through the subcutaneous tissue to the level of the extensor mechanism. A fresh blade is used to make a medial parapatellar arthrotomy. Soft tissue over the proximal medial tibia is subperiosteally elevated to the joint line with a knife and into the semimembranosus bursa with a  Cobb elevator. Soft tissue over the proximal lateral tibia is elevated with attention being paid to avoiding the patellar tendon on the tibial tubercle. The patella is everted, knee flexed 90 degrees and the ACL and PCL are removed. Findings are bone on bone medial and patellofemoral with large global osteophytes.        The drill is used to create a starting hole in the distal femur and the canal is thoroughly irrigated with sterile saline to remove the fatty contents. The 5 degree Right  valgus alignment guide is placed into the femoral canal and the distal femoral cutting block is pinned to remove 10 mm off the distal femur. Resection is made with an oscillating saw.      The tibia is subluxed forward and the menisci are removed. The extramedullary alignment guide is placed referencing proximally at the medial aspect of the tibial tubercle and distally along the second metatarsal axis and tibial crest. The block is pinned to remove 77mm off the more deficient medial  side. Resection is made with an oscillating saw. Size 3is the most appropriate size for the tibia and the proximal tibia is prepared with the modular drill and keel punch for that size.      The femoral sizing guide is placed and size 3 is most appropriate. Rotation is marked off the epicondylar axis and confirmed by creating a rectangular flexion gap at 90 degrees. The size 3 cutting block is pinned in this rotation and the anterior, posterior and chamfer cuts are made with the oscillating saw. The intercondylar block is then placed and that cut is made.  Trial size 3 tibial component, trial size 3 posterior stabilized femur and a 10  mm posterior stabilized rotating platform insert trial is placed. Full extension is achieved with excellent varus/valgus and anterior/posterior balance throughout full range of motion. The patella is everted and thickness measured to be 22  mm. Free hand resection is taken to 12 mm, a 35 template is placed, lug  holes are drilled, trial patella is placed, and it tracks normally. Osteophytes are removed off the posterior femur with the trial in place. All trials are removed and the cut bone surfaces prepared with pulsatile lavage. Cement is mixed and once ready for implantation, the size 3 tibial implant, size  3 posterior stabilized femoral component, and the size 35 patella are cemented in place and the patella is held with the clamp. The trial insert is placed and the knee held in full extension. The Exparel (20 ml mixed with 60 ml saline) is injected into the extensor mechanism, posterior capsule, medial and lateral gutters and subcutaneous tissues.  All extruded cement is removed and once the cement is hard the permanent 10 mm posterior stabilized rotating platform insert is placed into the tibial tray.      The wound is copiously irrigated with saline solution and the extensor mechanism closed over a hemovac drain with #1 V-loc suture. The tourniquet is released for a total tourniquet time of 32  minutes. Flexion against gravity is 140 degrees and the patella tracks normally. Subcutaneous tissue is closed with 2.0 vicryl and subcuticular with running 4.0 Monocryl. The incision is cleaned and dried and steri-strips and a bulky sterile dressing are applied. The limb is placed into a knee immobilizer and the patient is awakened and transported to recovery in stable condition.      Please note that a surgical assistant was a medical necessity for this procedure in order to perform it in a safe and expeditious manner. Surgical assistant was necessary to retract the ligaments and vital neurovascular structures to prevent injury to them and also necessary for proper positioning of the limb to allow for anatomic placement of the prosthesis.   Stephanie Plover Laurian Edrington, MD    01/14/2019, 10:23 AM

## 2019-01-14 NOTE — Discharge Instructions (Addendum)
° °Dr. Frank Aluisio °Total Joint Specialist °[facility] °[address] °[place], [place] [address] °[phone] ° °TOTAL KNEE REPLACEMENT POSTOPERATIVE DIRECTIONS ° °Knee Rehabilitation, Guidelines Following Surgery  °Results after knee surgery are often greatly improved when you follow the exercise, range of motion and muscle strengthening exercises prescribed by your doctor. Safety measures are also important to protect the knee from further injury. Any time any of these exercises cause you to have increased pain or swelling in your knee joint, decrease the amount until you are comfortable again and slowly increase them. If you have problems or questions, call your caregiver or physical therapist for advice.  ° °HOME CARE INSTRUCTIONS  °• Remove items at home which could result in a fall. This includes throw rugs or furniture in walking pathways.  °· ICE to the affected knee every three hours for 30 minutes at a time and then as needed for pain and swelling.  Continue to use ice on the knee for pain and swelling from surgery. You may notice swelling that will progress down to the foot and ankle.  This is normal after surgery.  Elevate the leg when you are not up walking on it.   °· Continue to use the breathing machine which will help keep your temperature down.  It is common for your temperature to cycle up and down following surgery, especially at night when you are not up moving around and exerting yourself.  The breathing machine keeps your lungs expanded and your temperature down. °· Do not place pillow under knee, focus on keeping the knee straight while resting ° °DIET °You may resume your previous home diet once your are discharged from the hospital. ° °DRESSING / WOUND CARE / SHOWERING °You may change your dressing 3-5 days after surgery.  Then change the dressing every day with sterile gauze.  Please use good hand washing techniques before changing the dressing.  Do not use any lotions  or creams on the incision until instructed by your surgeon. °You may start showering once you are discharged home but do not submerge the incision under water. Just pat the incision dry and apply a dry gauze dressing on daily. °Change the surgical dressing daily and reapply a dry dressing each time. ° °ACTIVITY °Walk with your walker as instructed. °Use walker as long as suggested by your caregivers. °Avoid periods of inactivity such as sitting longer than an hour when not asleep. This helps prevent blood clots.  °You may resume a sexual relationship in one month or when given the OK by your doctor.  °You may return to work once you are cleared by your doctor.  °Do not drive a car for 6 weeks or until released by you surgeon.  °Do not drive while taking narcotics. ° °WEIGHT BEARING °Weight bearing as tolerated with assist device (walker, cane, etc) as directed, use it as long as suggested by your surgeon or therapist, typically at least 4-6 weeks. ° °POSTOPERATIVE CONSTIPATION PROTOCOL °Constipation - defined medically as fewer than three stools per week and severe constipation as less than one stool per week. ° °One of the most common issues patients have following surgery is constipation.  Even if you have a regular bowel pattern at home, your normal regimen is likely to be disrupted due to multiple reasons following surgery.  Combination of anesthesia, postoperative narcotics, change in appetite and fluid intake all can affect your bowels.  In order to avoid complications following surgery, here are some   recommendations in order to help you during your recovery period. ° °Colace (docusate) - Pick up an over-the-counter form of Colace or another stool softener and take twice a day as long as you are requiring postoperative pain medications.  Take with a full glass of water daily.  If you experience loose stools or diarrhea, hold the colace until you stool forms back up.  If your symptoms do not get better within 1  week or if they get worse, check with your doctor. ° °Dulcolax (bisacodyl) - Pick up over-the-counter and take as directed by the product packaging as needed to assist with the movement of your bowels.  Take with a full glass of water.  Use this product as needed if not relieved by Colace only.  ° °MiraLax (polyethylene glycol) - Pick up over-the-counter to have on hand.  MiraLax is a solution that will increase the amount of water in your bowels to assist with bowel movements.  Take as directed and can mix with a glass of water, juice, soda, coffee, or tea.  Take if you go more than two days without a movement. °Do not use MiraLax more than once per day. Call your doctor if you are still constipated or irregular after using this medication for 7 days in a row. ° °If you continue to have problems with postoperative constipation, please contact the office for further assistance and recommendations.  If you experience "the worst abdominal pain ever" or develop nausea or vomiting, please contact the office immediatly for further recommendations for treatment. ° °ITCHING °If you experience itching with your medications, try taking only a single pain pill, or even half a pain pill at a time.  You can also use Benadryl over the counter for itching or also to help with sleep.  ° °TED HOSE STOCKINGS °Wear the elastic stockings on both legs for three weeks following surgery during the day but you may remove then at night for sleeping. ° °MEDICATIONS °See your medication summary on the “After Visit Summary” that the nursing staff will review with you prior to discharge.  You may have some home medications which will be placed on hold until you complete the course of blood thinner medication.  It is important for you to complete the blood thinner medication as prescribed by your surgeon.  Continue your approved medications as instructed at time of discharge. ° °PRECAUTIONS °If you experience chest pain or shortness of breath -  call 911 immediately for transfer to the hospital emergency department.  °If you develop a fever greater that 101 F, purulent drainage from wound, increased redness or drainage from wound, foul odor from the wound/dressing, or calf pain - CONTACT YOUR SURGEON.   °                                                °FOLLOW-UP APPOINTMENTS °Make sure you keep all of your appointments after your operation with your surgeon and caregivers. You should call the office at the above phone number and make an appointment for approximately two weeks after the date of your surgery or on the date instructed by your surgeon outlined in the "After Visit Summary". ° °RANGE OF MOTION AND STRENGTHENING EXERCISES  °Rehabilitation of the knee is important following a knee injury or an operation. After just a few days of immobilization, the muscles of   the thigh which control the knee become weakened and shrink (atrophy). Knee exercises are designed to build up the tone and strength of the thigh muscles and to improve knee motion. Often times heat used for twenty to thirty minutes before working out will loosen up your tissues and help with improving the range of motion but do not use heat for the first two weeks following surgery. These exercises can be done on a training (exercise) mat, on the floor, on a table or on a bed. Use what ever works the best and is most comfortable for you Knee exercises include:   Leg Lifts - While your knee is still immobilized in a splint or cast, you can do straight leg raises. Lift the leg to 60 degrees, hold for 3 sec, and slowly lower the leg. Repeat 10-20 times 2-3 times daily. Perform this exercise against resistance later as your knee gets better.   Quad and Hamstring Sets - Tighten up the muscle on the front of the thigh (Quad) and hold for 5-10 sec. Repeat this 10-20 times hourly. Hamstring sets are done by pushing the foot backward against an object and holding for 5-10 sec. Repeat as with quad  sets.   Leg Slides: Lying on your back, slowly slide your foot toward your buttocks, bending your knee up off the floor (only go as far as is comfortable). Then slowly slide your foot back down until your leg is flat on the floor again.  Angel Wings: Lying on your back spread your legs to the side as far apart as you can without causing discomfort.  A rehabilitation program following serious knee injuries can speed recovery and prevent re-injury in the future due to weakened muscles. Contact your doctor or a physical therapist for more information on knee rehabilitation.   IF YOU ARE TRANSFERRED TO A SKILLED REHAB FACILITY If the patient is transferred to a skilled rehab facility following release from the hospital, a list of the current medications will be sent to the facility for the patient to continue.  When discharged from the skilled rehab facility, please have the facility set up the patient's [place] prior to being released. Also, the skilled facility will be responsible for providing the patient with their medications at time of release from the facility to include their pain medication, the muscle relaxants, and their blood thinner medication. If the patient is still at the rehab facility at time of the two week follow up appointment, the skilled rehab facility will also need to assist the patient in arranging follow up appointment in our office and any transportation needs.  MAKE SURE YOU:   Understand these instructions.   Get help right away if you are not doing well or get worse.    Pick up stool softner and laxative for home use following surgery while on pain medications. Do not submerge incision under water. Please use good hand washing techniques while changing dressing each day. May shower starting three days after surgery. Please use a clean towel to pat the incision dry following showers. Continue to use ice for pain and swelling after surgery. Do not  use any lotions or creams on the incision until instructed by your surgeon.   Information on my medicine - XARELTO (Rivaroxaban)  This medication education was reviewed with me or my healthcare representative as part of my discharge preparation.  The pharmacist that spoke with me during my hospital stay was:  Why was Xarelto prescribed for  you? Xarelto was prescribed for you to reduce the risk of blood clots forming after orthopedic surgery. The medical term for these abnormal blood clots is venous thromboembolism (VTE).  What do you need to know about xarelto ? Take your Xarelto ONCE DAILY at the same time every day. You may take it either with or without food.  If you have difficulty swallowing the tablet whole, you may crush it and mix in applesauce just prior to taking your dose.  Take Xarelto exactly as prescribed by your doctor and DO NOT stop taking Xarelto without talking to the doctor who prescribed the medication.  Stopping without other VTE prevention medication to take the place of Xarelto may increase your risk of developing a clot.  After discharge, you should have regular check-up appointments with your healthcare provider that is prescribing your Xarelto.    What do you do if you miss a dose? If you miss a dose, take it as soon as you remember on the same day then continue your regularly scheduled once daily regimen the next day. Do not take two doses of Xarelto on the same day.   Important Safety Information A possible side effect of Xarelto is bleeding. You should call your healthcare provider right away if you experience any of the following: ? Bleeding from an injury or your nose that does not stop. ? Unusual colored urine (red or dark brown) or unusual colored stools (red or black). ? Unusual bruising for unknown reasons. ? A serious fall or if you hit your head (even if there is no bleeding).  Some medicines may interact with Xarelto and might increase  your risk of bleeding while on Xarelto. To help avoid this, consult your healthcare provider or pharmacist prior to using any new prescription or non-prescription medications, including herbals, vitamins, non-steroidal anti-inflammatory drugs (NSAIDs) and supplements.  This website has more information on Xarelto: [email or web address]

## 2019-01-14 NOTE — Progress Notes (Signed)
Assisted Dr. Rose with right, ultrasound guided, adductor canal block. Side rails up, monitors on throughout procedure. See vital signs in flow sheet. Tolerated Procedure well.  

## 2019-01-14 NOTE — Anesthesia Procedure Notes (Signed)
Anesthesia Procedure Image    

## 2019-01-14 NOTE — Transfer of Care (Signed)
Immediate Anesthesia Transfer of Care Note  Patient: Stephanie Alvarado  Procedure(s) Performed: TOTAL KNEE ARTHROPLASTY (Right )  Patient Location: PACU  Anesthesia Type:Spinal  Level of Consciousness: awake, alert  and oriented  Airway & Oxygen Therapy: Patient Spontanous Breathing and Patient connected to face mask oxygen  Post-op Assessment: Report given to RN and Post -op Vital signs reviewed and stable  Post vital signs: Reviewed and stable  Last Vitals:  Vitals Value Taken Time  BP 103/52 01/14/19 1045  Temp    Pulse 65 01/14/19 1048  Resp 15 01/14/19 1048  SpO2 97 % 01/14/19 1048  Vitals shown include unvalidated device data.  Last Pain:  Vitals:   01/14/19 0704  TempSrc: Oral         Complications: No apparent anesthesia complications

## 2019-01-14 NOTE — Anesthesia Procedure Notes (Signed)
Anesthesia Regional Block: Adductor canal block   Pre-Anesthetic Checklist: ,, timeout performed, Correct Patient, Correct Site, Correct Laterality, Correct Procedure, Correct Position, site marked, Risks and benefits discussed,  Surgical consent,  Pre-op evaluation,  At surgeon's request and post-op pain management  Laterality: Right  Prep: chloraprep       Needles:  Injection technique: Single-shot  Needle Type: Echogenic Needle     Needle Length: 9cm      Additional Needles:   Procedures:,,,, ultrasound used (permanent image in chart),,,,  Narrative:  Start time: 01/14/2019 8:31 AM End time: 01/14/2019 8:38 AM Injection made incrementally with aspirations every 5 mL.  Performed by: Personally  Anesthesiologist: Myrtie Soman, MD  Additional Notes: Patient tolerated the procedure well without complications

## 2019-01-15 ENCOUNTER — Encounter (HOSPITAL_COMMUNITY): Payer: Self-pay | Admitting: Orthopedic Surgery

## 2019-01-15 LAB — BASIC METABOLIC PANEL
Anion gap: 7 (ref 5–15)
BUN: 12 mg/dL (ref 8–23)
CO2: 24 mmol/L (ref 22–32)
Calcium: 9.2 mg/dL (ref 8.9–10.3)
Chloride: 106 mmol/L (ref 98–111)
Creatinine, Ser: 0.76 mg/dL (ref 0.44–1.00)
GFR calc Af Amer: >60 mL/min (ref >60)
GFR calc non Af Amer: >60 mL/min (ref >60)
Glucose, Bld: 159 mg/dL — ABNORMAL HIGH (ref 70–99)
Potassium: 4.6 mmol/L (ref 3.5–5.1)
Sodium: 137 mmol/L (ref 135–145)

## 2019-01-15 LAB — CBC
HCT: 37 % (ref 36.0–46.0)
Hemoglobin: 11.5 g/dL — ABNORMAL LOW (ref 12.0–15.0)
MCH: 29.2 pg (ref 26.0–34.0)
MCHC: 31.1 g/dL (ref 30.0–36.0)
MCV: 93.9 fL (ref 80.0–100.0)
Platelets: 144 10*3/uL — ABNORMAL LOW (ref 150–400)
RBC: 3.94 MIL/uL (ref 3.87–5.11)
RDW: 15.7 % — ABNORMAL HIGH (ref 11.5–15.5)
WBC: 13.8 10*3/uL — ABNORMAL HIGH (ref 4.0–10.5)
nRBC: 0 % (ref 0.0–0.2)

## 2019-01-15 NOTE — Progress Notes (Signed)
   01/15/19 1400  PT Visit Information  Last PT Received On 01/15/19---pt progressing toward goals; exercise focused session as pt felt her pain was just now under control and declined OOB.   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  Precautions  Precautions Fall;Knee  Precaution Comments IND SLRs  Required Braces or Orthoses Knee Immobilizer - Right  Knee Immobilizer - Right Discontinue once straight leg raise with < 10 degree lag  Restrictions  RLE Weight Bearing WBAT  Pain Assessment  Pain Assessment 0-10  Pain Score 5  Pain Location right knee  Pain Descriptors / Indicators Aching;Grimacing;Sore  Pain Intervention(s) Limited activity within patient's tolerance;Monitored during session;Premedicated before session;Ice applied  Cognition  Arousal/Alertness Awake/alert  Behavior During Therapy WFL for tasks assessed/performed  Overall Cognitive Status Within Functional Limits for tasks assessed  Ambulation/Gait  Gait velocity declined amb d/t fatigue and pain, just feels like her pain is now under control  Total Joint Exercises  Ankle Circles/Pumps AROM;Both;10 reps  Quad Sets AROM;Both;10 reps  Heel Slides AROM;Strengthening;Right;10 reps  Hip ABduction/ADduction AROM;Strengthening;AAROM;Right  Straight Leg Raises AROM;AAROM;Right;10 reps  Goniometric ROM grossly 8 to 65 degrees flexion AAROM  Short Arc Quad AROM;Right;10 reps  PT - End of Session  Activity Tolerance Patient tolerated treatment well;Patient limited by pain  Patient left in bed;with call bell/phone within reach;with bed alarm set   PT - Assessment/Plan  PT Plan Current plan remains appropriate  PT Visit Diagnosis Difficulty in walking, not elsewhere classified (R26.2)  PT Frequency (ACUTE ONLY) 7X/week  Follow Up Recommendations Follow surgeon's recommendation for DC plan and follow-up therapies  PT equipment Rolling walker with 5" wheels  AM-PAC  PT "6 Clicks" Mobility Outcome Measure (Version 2)  Help needed turning from your back to your side while in a flat bed without using bedrails? 3  Help needed moving from lying on your back to sitting on the side of a flat bed without using bedrails? 3  Help needed moving to and from a bed to a chair (including a wheelchair)? 3  Help needed standing up from a chair using your arms (e.g., wheelchair or bedside chair)? 3  Help needed to walk in hospital room? 3  Help needed climbing 3-5 steps with a railing?  2  6 Click Score 17  Consider Recommendation of Discharge To: Home with [facility]  PT Goal Progression  Progress towards PT goals Progressing toward goals  Acute Rehab PT Goals  PT Goal Formulation With patient  Time For Goal Achievement 01/21/19  Potential to Achieve Goals Good  PT Time Calculation  PT Start Time (ACUTE ONLY) 1446  PT Stop Time (ACUTE ONLY) 1458  PT Time Calculation (min) (ACUTE ONLY) 12 min  PT General Charges  $$ ACUTE PT VISIT 1 Visit  PT Treatments  $Therapeutic Exercise 8-22 mins

## 2019-01-15 NOTE — Plan of Care (Signed)

## 2019-01-15 NOTE — Progress Notes (Signed)
Subjective: 1 Day Post-Op Procedure(s) (LRB): TOTAL KNEE ARTHROPLASTY (Right) Patient reports pain as moderate.   Patient seen in rounds by Dr. Wynelle Link. Patient is well, and has had no acute complaints or problems other than pain in the right knee. No issues overnight. Denies chest pain, SOB, or calf pain. Foley catheter removed this AM.  We will continue therapy today.   Objective: Vital signs in last 24 hours: Temp:  [97.5 F (36.4 C)-98 F (36.7 C)] 97.5 F (36.4 C) (08/04 0545) Pulse Rate:  [54-69] 58 (08/04 0545) Resp:  [13-25] 16 (08/04 0545) BP: (103-155)/(52-89) 114/62 (08/04 0545) SpO2:  [92 %-100 %] 100 % (08/04 0545) Weight:  [94.9 kg] 94.9 kg (08/03 1930)  Intake/Output from previous day:  Intake/Output Summary (Last 24 hours) at 01/15/2019 0729 Last data filed at 01/15/2019 0531 Gross per 24 hour  Intake 4226.83 ml  Output 4640 ml  Net -413.17 ml    Labs: Recent Labs    01/15/19 0313  HGB 11.5*   Recent Labs    01/15/19 0313  WBC 13.8*  RBC 3.94  HCT 37.0  PLT 144*   Recent Labs    01/15/19 0313  NA 137  K 4.6  CL 106  CO2 24  BUN 12  CREATININE 0.76  GLUCOSE 159*  CALCIUM 9.2   Exam: General - Patient is Alert and Oriented Extremity - Neurologically intact Neurovascular intact Sensation intact distally Dorsiflexion/Plantar flexion intact Dressing - dressing C/D/I Motor Function - intact, moving foot and toes well on exam.   Past Medical History:  Diagnosis Date  . Achilles tendon rupture, right, initial encounter   . Allergic rhinitis, cause unspecified   . Anemia, unspecified   . Anxiety state, unspecified   . Atherosclerosis of aorta ([place])    TEE, June, 2010, grade 4 aortic arch atherosclerosis , Dr. Aundra Dubin  . CAD (coronary artery disease)    DES and circumflex 2006 /  DES to LAD 2011  . Carotid artery disease ([place])    Doppler, November, 2011, stable, 0-39% bilateral, turbulent flow left subclavian  . Chronic diastolic heart  failure ([place])   . Cyst of thyroid   . Diverticulosis of colon (without mention of hemorrhage)   . Dysphasia    Possibly esophageal stricture  . Ejection fraction     EF 60%, Echo, November 12, 2008, /  EF 55%, TEE, November 17, 2008  . Esophageal reflux   . Hiatal hernia   . Hyperlipemia   . Hypertension   . Lumbago   . Lumbar disc disease   . MVP (mitral valve prolapse)   . Nonspecific abnormal finding in stool contents   . Obesity, unspecified   . Osteoarthrosis, unspecified whether generalized or localized, unspecified site   . Osteopenia   . Other diseases of lung, not elsewhere classified   . Palpitations    October, 2012  . Peripheral vascular disease, unspecified ([place])   . Personal history of colonic polyps    ADENOMATOUS POLYP  . PFO (patent foramen ovale)    Patient had a very small PFO by TEE 2010.  This was seen only by bubble analysis during Valsalva  . Shortness of breath   . Sleep apnea    uses CPAP nightly  . Sleep related hypoventilation/hypoxemia in conditions classifiable elsewhere   . Stroke [facility]) 11/2008   Treated with TPA? /     2010, TEE no left atrial clot, probably from atherosclerosis of the aortic arch  . 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 Victorino Dike.  Marland Kitchen Unspecified cerebral artery occlusion with cerebral infarction   . Unspecified venous (peripheral) insufficiency     Assessment/Plan: 1 Day Post-Op Procedure(s) (LRB): TOTAL KNEE ARTHROPLASTY (Right) Principal Problem:   OA (osteoarthritis) of knee  Estimated body mass index is 32.77 kg/m as calculated from the following:   Height as of this encounter: 5\' 7"  (1.702 m).   Weight as of this encounter: 94.9 kg. Advance diet Up with therapy  Anticipated LOS equal to or greater than 2 midnights due to - Age 80 and older with one or more of the following:  - Obesity  - Expected need for hospital services (PT, OT, Nursing) required for safe  discharge  - Anticipated need for  postoperative skilled nursing care or inpatient rehab  - Active co-morbidities: Coronary Artery Disease and Stroke OR   - Unanticipated findings during/Post Surgery: None  - Patient is a high risk of re-admission due to: None    DVT Prophylaxis - Xarelto Weight bearing as tolerated. D/C O2 and pulse ox and try on room air. Hemovac pulled without difficulty, will continue therapy today.  Plan is to go Home after hospital stay. Due to medical comorbidities and pain levels, patient will require another night stay for observation. Plan for discharge tomorrow pending progress with therapy.  Theresa Duty, PA-C Orthopedic Surgery 01/15/2019, 7:29 AM

## 2019-01-15 NOTE — Progress Notes (Signed)
Physical Therapy Treatment Patient Details Name: Stephanie Alvarado MRN: [id number] DOB: 1941/04/01 Today's Date: 01/15/2019    History of Present Illness s/p R TKA; PMH: HTM, CHF, anxiety, CVA without sequelae    PT Comments    Pt progressing but having some issues with pain. Will likely benefit from another day to maximize independence.   Follow Up Recommendations  Follow surgeon's recommendation for DC plan and follow-up therapies     Equipment Recommendations  Rolling walker with 5" wheels    Recommendations for Other Services       Precautions / Restrictions Precautions Precautions: Fall;Knee Precaution Comments: IND SLRs Required Braces or Orthoses: Knee Immobilizer - Right Knee Immobilizer - Right: Discontinue once straight leg raise with < 10 degree lag Restrictions Weight Bearing Restrictions: No RLE Weight Bearing: Weight bearing as tolerated    Mobility  Bed Mobility Overal bed mobility: Needs Assistance Bed Mobility: Supine to Sit     Supine to sit: Min guard     General bed mobility comments: with RLE, incr time  Transfers Overall transfer level: Needs assistance Equipment used: Rolling walker (2 wheeled) Transfers: Sit to/from Stand Sit to Stand: Min assist         General transfer comment: assist to rise and transition to RW, cues for hand placement  Ambulation/Gait Ambulation/Gait assistance: Min assist;Min guard Gait Distance (Feet): 80 Feet Assistive device: Rolling walker (2 wheeled) Gait Pattern/deviations: Step-to pattern;Decreased weight shift to right     General Gait Details: cues for sequence and RW position   Stairs             Wheelchair Mobility    Modified Rankin (Stroke Patients Only)       Balance                                            Cognition Arousal/Alertness: Awake/alert Behavior During Therapy: WFL for tasks assessed/performed Overall Cognitive Status: Within Functional Limits  for tasks assessed                                        Exercises Total Joint Exercises Ankle Circles/Pumps: AROM;Both;10 reps    General Comments        Pertinent Vitals/Pain Pain Assessment: 0-10 Pain Score: 7  Pain Location: right knee Pain Descriptors / Indicators: Aching;Grimacing;Sore Pain Intervention(s): Limited activity within patient's tolerance;Monitored during session;Premedicated before session;Repositioned;RN gave pain meds during session    Home Living                      Prior Function            PT Goals (current goals can now be found in the care plan section) Acute Rehab PT Goals PT Goal Formulation: With patient Time For Goal Achievement: 01/21/19 Potential to Achieve Goals: Good Progress towards PT goals: Progressing toward goals    Frequency    7X/week      PT Plan Current plan remains appropriate    Co-evaluation              AM-PAC PT "6 Clicks" Mobility   Outcome Measure  Help needed turning from your back to your side while in a flat bed without using bedrails?: A Little Help needed moving from lying on  your back to sitting on the side of a flat bed without using bedrails?: A Little Help needed moving to and from a bed to a chair (including a wheelchair)?: A Little Help needed standing up from a chair using your arms (e.g., wheelchair or bedside chair)?: A Little Help needed to walk in hospital room?: A Little Help needed climbing 3-5 steps with a railing? : A Lot 6 Click Score: 17    End of Session Equipment Utilized During Treatment: Gait belt Activity Tolerance: Patient tolerated treatment well;Patient limited by pain Patient left: with call bell/phone within reach;in chair;with chair alarm set   PT Visit Diagnosis: Difficulty in walking, not elsewhere classified (R26.2)     Time: 1212-1225 PT Time Calculation (min) (ACUTE ONLY): 13 min  Charges:  $Gait Training: 8-22 mins                      Kenyon Ana, PT  Pager: [phone] Acute Rehab Dept [facility]): [phone]   01/15/2019    [facility] 01/15/2019, 12:35 PM

## 2019-01-16 ENCOUNTER — Other Ambulatory Visit (HOSPITAL_COMMUNITY): Payer: PPO

## 2019-01-16 LAB — CBC
HCT: 34.8 % — ABNORMAL LOW (ref 36.0–46.0)
Hemoglobin: 10.7 g/dL — ABNORMAL LOW (ref 12.0–15.0)
MCH: 29.1 pg (ref 26.0–34.0)
MCHC: 30.7 g/dL (ref 30.0–36.0)
MCV: 94.6 fL (ref 80.0–100.0)
Platelets: 159 10*3/uL (ref 150–400)
RBC: 3.68 MIL/uL — ABNORMAL LOW (ref 3.87–5.11)
RDW: 16 % — ABNORMAL HIGH (ref 11.5–15.5)
WBC: 15.2 10*3/uL — ABNORMAL HIGH (ref 4.0–10.5)
nRBC: 0 % (ref 0.0–0.2)

## 2019-01-16 LAB — BASIC METABOLIC PANEL
Anion gap: 8 (ref 5–15)
BUN: 16 mg/dL (ref 8–23)
CO2: 26 mmol/L (ref 22–32)
Calcium: 9.1 mg/dL (ref 8.9–10.3)
Chloride: 106 mmol/L (ref 98–111)
Creatinine, Ser: 0.78 mg/dL (ref 0.44–1.00)
GFR calc Af Amer: >60 mL/min (ref >60)
GFR calc non Af Amer: >60 mL/min (ref >60)
Glucose, Bld: 116 mg/dL — ABNORMAL HIGH (ref 70–99)
Potassium: 4 mmol/L (ref 3.5–5.1)
Sodium: 140 mmol/L (ref 135–145)

## 2019-01-16 MED ORDER — OXYCODONE HCL 5 MG PO TABS: 5.0000 mg | ORAL_TABLET | Freq: Four times a day (QID) | ORAL | 0 refills | Status: DC | PRN

## 2019-01-16 MED ORDER — METHOCARBAMOL 500 MG PO TABS: 500.0000 mg | ORAL_TABLET | Freq: Four times a day (QID) | ORAL | 0 refills | Status: DC | PRN

## 2019-01-16 MED ORDER — RIVAROXABAN 10 MG PO TABS: 10.0000 mg | ORAL_TABLET | Freq: Every day | ORAL | 0 refills | Status: DC

## 2019-01-16 NOTE — Progress Notes (Signed)
The pt was provided with d/c instructions. After discussing the pt's plan of care upon d/c home, the pt reported no further questions or concerns. The pt has one more session of PT prior to d/c home today.

## 2019-01-16 NOTE — Progress Notes (Signed)
Subjective: 2 Days Post-Op Procedure(s) (LRB): TOTAL KNEE ARTHROPLASTY (Right) Patient reports pain as mild.   Patient seen in rounds by Dr. Wynelle Link. Patient is well, and has had no acute complaints or problems other than pain in the right hip. No acute events overnight. Patient states she is ready to go home. Denies CP, SHOB, N/V.  Plan is to go Home after hospital stay.  Objective: Vital signs in last 24 hours: Temp:  [97.5 F (36.4 C)-98 F (36.7 C)] 98 F (36.7 C) (08/05 0505) Pulse Rate:  [54-64] 58 (08/05 0505) Resp:  [15-17] 17 (08/05 0505) BP: (107-119)/(54-58) 107/54 (08/05 0505) SpO2:  [90 %-98 %] 90 % (08/05 0505)  Intake/Output from previous day:  Intake/Output Summary (Last 24 hours) at 01/16/2019 0717 Last data filed at 01/16/2019 0600 Gross per 24 hour  Intake 2469.95 ml  Output 1000 ml  Net 1469.95 ml    Intake/Output this shift: No intake/output data recorded.  Labs: Recent Labs    01/15/19 0313 01/16/19 0324  HGB 11.5* 10.7*   Recent Labs    01/15/19 0313 01/16/19 0324  WBC 13.8* 15.2*  RBC 3.94 3.68*  HCT 37.0 34.8*  PLT 144* 159   Recent Labs    01/15/19 0313 01/16/19 0324  NA 137 140  K 4.6 4.0  CL 106 106  CO2 24 26  BUN 12 16  CREATININE 0.76 0.78  GLUCOSE 159* 116*  CALCIUM 9.2 9.1   No results for input(s): LABPT, INR in the last 72 hours.  Exam: General - Patient is Alert and Oriented Extremity - Neurologically intact Sensation intact distally Intact pulses distally Dorsiflexion/Plantar flexion intact Dressing/Incision - clean, no drainage Motor Function - intact, moving foot and toes well on exam.   Past Medical History:  Diagnosis Date  . Achilles tendon rupture, right, initial encounter   . Allergic rhinitis, cause unspecified   . Anemia, unspecified   . Anxiety state, unspecified   . Atherosclerosis of aorta ([place])    TEE, June, 2010, grade 4 aortic arch atherosclerosis , Dr. Aundra Dubin  . CAD (coronary artery  disease)    DES and circumflex 2006 /  DES to LAD 2011  . Carotid artery disease ([place])    Doppler, November, 2011, stable, 0-39% bilateral, turbulent flow left subclavian  . Chronic diastolic heart failure ([place])   . Cyst of thyroid   . Diverticulosis of colon (without mention of hemorrhage)   . Dysphasia    Possibly esophageal stricture  . Ejection fraction     EF 60%, Echo, November 12, 2008, /  EF 55%, TEE, November 17, 2008  . Esophageal reflux   . Hiatal hernia   . Hyperlipemia   . Hypertension   . Lumbago   . Lumbar disc disease   . MVP (mitral valve prolapse)   . Nonspecific abnormal finding in stool contents   . Obesity, unspecified   . Osteoarthrosis, unspecified whether generalized or localized, unspecified site   . Osteopenia   . Other diseases of lung, not elsewhere classified   . Palpitations    October, 2012  . Peripheral vascular disease, unspecified ([place])   . Personal history of colonic polyps    ADENOMATOUS POLYP  . PFO (patent foramen ovale)    Patient had a very small PFO by TEE 2010.  This was seen only by bubble analysis during Valsalva  . Shortness of breath   . Sleep apnea    uses CPAP nightly  . Sleep related hypoventilation/hypoxemia  in conditions classifiable elsewhere   . Stroke [facility]) 11/2008   Treated with TPA? /     2010, TEE no left atrial clot, probably from atherosclerosis of the aortic arch  . 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)    Remote surgery by Dr. Victorino Dike.  Marland Kitchen Unspecified cerebral artery occlusion with cerebral infarction   . Unspecified venous (peripheral) insufficiency     Assessment/Plan: 2 Days Post-Op Procedure(s) (LRB): TOTAL KNEE ARTHROPLASTY (Right) Principal Problem:   OA (osteoarthritis) of knee  Estimated body mass index is 32.77 kg/m as calculated from the following:   Height as of this encounter: 5\' 7"  (1.702 m).   Weight as of this encounter: 94.9 kg. Advance diet Up with therapy D/C IV fluids  DVT Prophylaxis - Xarelto  Weight-bearing as tolerated  Plan for discharge home today after 2 sessions of physical therapy. HHPT ordered. Will do outpatient PT at [facility] following this. Follow up in the office in 2 weeks.   Griffith Citron, PA-C Orthopedic Surgery 01/16/2019, 7:17 AM

## 2019-01-16 NOTE — Progress Notes (Signed)
   01/16/19 1600  PT Visit Information  Last PT Received On 01/16/19 Exercise focused session,  Pt tolerated well and wanted to finish her supper. Rn aware pt ready for d/c.    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  Precautions  Precautions Fall;Knee  Precaution Comments IND SLRs  Required Braces or Orthoses Knee Immobilizer - Right  Knee Immobilizer - Right Discontinue once straight leg raise with < 10 degree lag  Restrictions  Weight Bearing Restrictions No  RLE Weight Bearing WBAT  Pain Assessment  Pain Assessment 0-10  Pain Score 4  Pain Location right knee  Pain Descriptors / Indicators Aching;Grimacing;Sore  Pain Intervention(s) Limited activity within patient's tolerance;Monitored during session;RN gave pain meds during session  Cognition  Arousal/Alertness Awake/alert  Behavior During Therapy [facility] for tasks assessed/performed  Overall Cognitive Status Within Functional Limits for tasks assessed  Total Joint Exercises  Ankle Circles/Pumps AROM;Both;10 reps  Quad Sets AROM;Both;10 reps  Heel Slides AROM;Strengthening;Right;10 reps  Hip ABduction/ADduction AROM;Strengthening;AAROM;Right  Straight Leg Raises AROM;AAROM;Right;10 reps  Goniometric ROM grossly 5* to 65* after prolonged extension stretch  Short Arc Quad AROM;Right;10 reps  PT - End of Session  Activity Tolerance Patient tolerated treatment well  Patient left in bed;with call bell/phone within reach;with bed alarm set   PT - Assessment/Plan  PT Plan Current plan remains appropriate  PT Visit Diagnosis Difficulty in walking, not elsewhere classified (R26.2)  PT Frequency (ACUTE ONLY) 7X/week  Follow Up Recommendations Follow surgeon's recommendation for DC plan and follow-up therapies  PT equipment Rolling walker with 5" wheels  AM-PAC PT "6 Clicks" Mobility Outcome Measure (Version 2)  Help needed turning from your back to your side while in a  flat bed without using bedrails? 3  Help needed moving from lying on your back to sitting on the side of a flat bed without using bedrails? 3  Help needed moving to and from a bed to a chair (including a wheelchair)? 3  Help needed standing up from a chair using your arms (e.g., wheelchair or bedside chair)? 3  Help needed to walk in hospital room? 3  Help needed climbing 3-5 steps with a railing?  3  6 Click Score 18  Consider Recommendation of Discharge To: Home with [facility]  PT Goal Progression  Progress towards PT goals Progressing toward goals  Acute Rehab PT Goals  PT Goal Formulation With patient  Time For Goal Achievement 01/21/19  Potential to Achieve Goals Good  PT Time Calculation  PT Start Time (ACUTE ONLY) 1608  PT Stop Time (ACUTE ONLY) 1624  PT Time Calculation (min) (ACUTE ONLY) 16 min  PT General Charges  $$ ACUTE PT VISIT 1 Visit  PT Treatments  $Therapeutic Exercise 8-22 mins

## 2019-01-16 NOTE — Progress Notes (Signed)
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    PT Comments    Pt progressing, this was 3rd attempt to see pt for first session, pt requests 2 sessions of PT as she stated this is what the PA told her would happen; pt wants to nap at this time; will return later this pm to review full HEP   Follow Up Recommendations  Follow surgeon's recommendation for DC plan and follow-up therapies     Equipment Recommendations  Rolling walker with 5" wheels    Recommendations for Other Services       Precautions / Restrictions Precautions Precautions: Fall;Knee Precaution Comments: IND SLRs Required Braces or Orthoses: Knee Immobilizer - Right Knee Immobilizer - Right: Discontinue once straight leg raise with < 10 degree lag Restrictions Weight Bearing Restrictions: No RLE Weight Bearing: Weight bearing as tolerated    Mobility  Bed Mobility Overal bed mobility: Needs Assistance Bed Mobility: Sit to Supine       Sit to supine: Supervision   General bed mobility comments: incr time. no physical assist   Transfers Overall transfer level: Needs assistance Equipment used: Rolling walker (2 wheeled) Transfers: Sit to/from Stand Sit to Stand: Min guard;Supervision         General transfer comment: cues for hand placement, and overall safety  Ambulation/Gait Ambulation/Gait assistance: Supervision;Min guard Gait Distance (Feet): 75 Feet Assistive device: Rolling walker (2 wheeled) Gait Pattern/deviations: Step-to pattern;Decreased weight shift to right     General Gait Details: cues for sequence and RW position, trunk extension    Stairs             Wheelchair Mobility    Modified Rankin (Stroke Patients Only)       Balance                                            Cognition  Arousal/Alertness: Awake/alert Behavior During Therapy: WFL for tasks assessed/performed Overall Cognitive Status: Within Functional Limits for tasks assessed                                        Exercises      General Comments        Pertinent Vitals/Pain Pain Assessment: 0-10 Pain Score: 4  Pain Location: right knee Pain Descriptors / Indicators: Aching;Grimacing;Sore Pain Intervention(s): Limited activity within patient's tolerance;Monitored during session;Premedicated before session;Repositioned;Ice applied    Home Living                      Prior Function            PT Goals (current goals can now be found in the care plan section) Acute Rehab PT Goals PT Goal Formulation: With patient Time For Goal Achievement: 01/21/19 Potential to Achieve Goals: Good Progress towards PT goals: Progressing toward goals    Frequency    7X/week      PT Plan Current plan remains appropriate    Co-evaluation              AM-PAC PT "6 Clicks" Mobility   Outcome Measure  Help needed turning from your back  to your side while in a flat bed without using bedrails?: A Little Help needed moving from lying on your back to sitting on the side of a flat bed without using bedrails?: None Help needed moving to and from a bed to a chair (including a wheelchair)?: A Little Help needed standing up from a chair using your arms (e.g., wheelchair or bedside chair)?: A Little Help needed to walk in hospital room?: A Little Help needed climbing 3-5 steps with a railing? : A Little 6 Click Score: 19    End of Session Equipment Utilized During Treatment: Gait belt Activity Tolerance: Patient tolerated treatment well;Patient limited by pain Patient left: in bed;with call bell/phone within reach;with bed alarm set   PT Visit Diagnosis: Difficulty in walking, not elsewhere classified (R26.2)     Time: 1335-1406 PT Time Calculation (min) (ACUTE ONLY): 31  min  Charges:  $Gait Training: 23-37 mins                     Kenyon Ana, PT  Pager: [phone] Acute Rehab Dept [facility]): [phone]   01/16/2019    [facility] 01/16/2019, 2:34 PM

## 2019-01-21 NOTE — Discharge Summary (Signed)
Physician Discharge Summary   Patient ID: Stephanie Alvarado MRN: [id number] DOB/AGE: [birth year] 80 y.o.  Admit date: 01/14/2019 Discharge date: 01/16/2019  Primary Diagnosis: Osteoarthritis, right knee   Admission Diagnoses:  Past Medical History:  Diagnosis Date  . Achilles tendon rupture, right, initial encounter   . Allergic rhinitis, cause unspecified   . Anemia, unspecified   . Anxiety state, unspecified   . Atherosclerosis of aorta ([place])    TEE, June, 2010, grade 4 aortic arch atherosclerosis , Dr. Aundra Dubin  . CAD (coronary artery disease)    DES and circumflex 2006 /  DES to LAD 2011  . Carotid artery disease ([place])    Doppler, November, 2011, stable, 0-39% bilateral, turbulent flow left subclavian  . Chronic diastolic heart failure ([place])   . Cyst of thyroid   . Diverticulosis of colon (without mention of hemorrhage)   . Dysphasia    Possibly esophageal stricture  . Ejection fraction     EF 60%, Echo, November 12, 2008, /  EF 55%, TEE, November 17, 2008  . Esophageal reflux   . Hiatal hernia   . Hyperlipemia   . Hypertension   . Lumbago   . Lumbar disc disease   . MVP (mitral valve prolapse)   . Nonspecific abnormal finding in stool contents   . Obesity, unspecified   . Osteoarthrosis, unspecified whether generalized or localized, unspecified site   . Osteopenia   . Other diseases of lung, not elsewhere classified   . Palpitations    October, 2012  . Peripheral vascular disease, unspecified ([place])   . Personal history of colonic polyps    ADENOMATOUS POLYP  . PFO (patent foramen ovale)    Patient had a very small PFO by TEE 2010.  This was seen only by bubble analysis during Valsalva  . Shortness of breath   . Sleep apnea    uses CPAP nightly  . Sleep related hypoventilation/hypoxemia in conditions classifiable elsewhere   . Stroke [facility]) 11/2008   Treated with TPA? /     2010, TEE no left atrial clot, probably from atherosclerosis of the aortic arch  . Subclavian artery disease  [facility])    Remote surgery by Dr. Victorino Dike.  Marland Kitchen Unspecified cerebral artery occlusion with cerebral infarction   . Unspecified venous (peripheral) insufficiency    Discharge Diagnoses:   Principal Problem:   OA (osteoarthritis) of knee  Estimated body mass index is 32.77 kg/m as calculated from the following:   Height as of this encounter: 5\' 7"  (1.702 m).   Weight as of this encounter: 94.9 kg.  Procedure:  Procedure(s) (LRB): TOTAL KNEE ARTHROPLASTY (Right)   Consults: None  HPI: Stephanie Alvarado is a 78 y.o. year old female with end stage OA of her right knee with progressively worsening pain and dysfunction. She has constant pain, with activity and at rest and significant functional deficits with difficulties even with ADLs. She has had extensive non-op management including analgesics, injections of cortisone and viscosupplements, and home exercise program, but remains in significant pain with significant dysfunction.Radiographs show bone on bone arthritis medial and patellofemoral. She presents now for right Total Knee Arthroplasty.    Laboratory Data: Admission on 01/14/2019, Discharged on 01/16/2019  Component Date Value Ref Range Status  . WBC 01/15/2019 13.8* 4.0 - 10.5 K/uL Final  . RBC 01/15/2019 3.94  3.87 - 5.11 MIL/uL Final  . Hemoglobin 01/15/2019 11.5* 12.0 - 15.0 g/dL Final  . HCT 01/15/2019 37.0  36.0 - 46.0 %  Final  . MCV 01/15/2019 93.9  80.0 - 100.0 fL Final  . MCH 01/15/2019 29.2  26.0 - 34.0 pg Final  . MCHC 01/15/2019 31.1  30.0 - 36.0 g/dL Final  . RDW 01/15/2019 15.7* 11.5 - 15.5 % Final  . Platelets 01/15/2019 144* 150 - 400 K/uL Final  . nRBC 01/15/2019 0.0  0.0 - 0.2 % Final   Performed at [facility]: [place] [address], [place] [address], [place], [place] [address]  . Sodium 01/15/2019 137  135 - 145 mmol/L Final  . Potassium 01/15/2019 4.6  3.5 - 5.1 mmol/L Final  . Chloride 01/15/2019 106  98 - 111 mmol/L Final  . CO2 01/15/2019 24  22 - 32 mmol/L Final   . Glucose, Bld 01/15/2019 159* 70 - 99 mg/dL Final  . BUN 01/15/2019 12  8 - 23 mg/dL Final  . Creatinine, Ser 01/15/2019 0.76  0.44 - 1.00 mg/dL Final  . Calcium 01/15/2019 9.2  8.9 - 10.3 mg/dL Final  . GFR calc non Af Amer 01/15/2019 >60  >60 mL/min Final  . GFR calc Af Amer 01/15/2019 >60  >60 mL/min Final  . Anion gap 01/15/2019 7  5 - 15 Final   Performed at [facility], [place] [address], [place], [place] [address]  . WBC 01/16/2019 15.2* 4.0 - 10.5 K/uL Final  . RBC 01/16/2019 3.68* 3.87 - 5.11 MIL/uL Final  . Hemoglobin 01/16/2019 10.7* 12.0 - 15.0 g/dL Final  . HCT 01/16/2019 34.8* 36.0 - 46.0 % Final  . MCV 01/16/2019 94.6  80.0 - 100.0 fL Final  . MCH 01/16/2019 29.1  26.0 - 34.0 pg Final  . MCHC 01/16/2019 30.7  30.0 - 36.0 g/dL Final  . RDW 01/16/2019 16.0* 11.5 - 15.5 % Final  . Platelets 01/16/2019 159  150 - 400 K/uL Final  . nRBC 01/16/2019 0.0  0.0 - 0.2 % Final   Performed at [facility], [place] [address], [place], [place] [address]  . Sodium 01/16/2019 140  135 - 145 mmol/L Final  . Potassium 01/16/2019 4.0  3.5 - 5.1 mmol/L Final  . Chloride 01/16/2019 106  98 - 111 mmol/L Final  . CO2 01/16/2019 26  22 - 32 mmol/L Final  . Glucose, Bld 01/16/2019 116* 70 - 99 mg/dL Final  . BUN 01/16/2019 16  8 - 23 mg/dL Final  . Creatinine, Ser 01/16/2019 0.78  0.44 - 1.00 mg/dL Final  . Calcium 01/16/2019 9.1  8.9 - 10.3 mg/dL Final  . GFR calc non Af Amer 01/16/2019 >60  >60 mL/min Final  . GFR calc Af Amer 01/16/2019 >60  >60 mL/min Final  . Anion gap 01/16/2019 8  5 - 15 Final   Performed at [facility], [place] [address] [place], [place] [address]  Hospital Outpatient Visit on 01/10/2019  Component Date Value Ref Range Status  . SARS Coronavirus 2 01/10/2019 NEGATIVE  NEGATIVE Final   Comment: (NOTE) SARS-CoV-2 target nucleic acids are NOT DETECTED. The SARS-CoV-2 RNA is generally detectable in upper and lower  respiratory specimens during the acute phase of infection. Negative results do not preclude SARS-CoV-2 infection, do not rule out co-infections with other pathogens, and should not be used as the sole basis for treatment or other patient management decisions. Negative results must be combined with clinical observations, patient history, and epidemiological information. The expected result is Negative. Fact Sheet for Patients: SugarRoll.be Fact Sheet for Healthcare Providers: https://www.woods-mathews.com/ This test is not yet approved  or cleared by the [place] and  has been authorized for detection and/or diagnosis of SARS-CoV-2 by FDA under an Emergency Use Authorization (EUA). This EUA will remain  in effect (meaning this test can be used) for the duration of the COVID-19 declaration under Section 56                          4(b)(1) of the Act, 21 U.S.C. section 360bbb-3(b)(1), unless the authorization is terminated or revoked sooner. Performed at [facility], [place] [address] [place], [place] [address]   Hospital Outpatient Visit on 01/08/2019  Component Date Value Ref Range Status  . MRSA, PCR 01/08/2019 NEGATIVE  NEGATIVE Final  . Staphylococcus aureus 01/08/2019 NEGATIVE  NEGATIVE Final   Comment: (NOTE) The Xpert SA Assay (FDA approved for NASAL specimens in patients 48 years of age and older), is one component of a comprehensive surveillance program. It is not intended to diagnose infection nor to guide or monitor treatment. Performed at [facility], [place] [address], [place], [place] [address]   . aPTT 01/08/2019 31  24 - 36 seconds Final   Performed at [facility], [place] [address], [place], [place] [address]  . WBC 01/08/2019 6.4  4.0 - 10.5 K/uL Final  . RBC 01/08/2019 4.60  3.87 - 5.11 MIL/uL Final  . Hemoglobin 01/08/2019 13.3  12.0 - 15.0 g/dL Final  . HCT 01/08/2019 43.1   36.0 - 46.0 % Final  . MCV 01/08/2019 93.7  80.0 - 100.0 fL Final  . MCH 01/08/2019 28.9  26.0 - 34.0 pg Final  . MCHC 01/08/2019 30.9  30.0 - 36.0 g/dL Final  . RDW 01/08/2019 16.2* 11.5 - 15.5 % Final  . Platelets 01/08/2019 170  150 - 400 K/uL Final  . nRBC 01/08/2019 0.0  0.0 - 0.2 % Final   Performed at [facility], [place] [address], [place], [place] [address]  . Sodium 01/08/2019 142  135 - 145 mmol/L Final  . Potassium 01/08/2019 4.3  3.5 - 5.1 mmol/L Final  . Chloride 01/08/2019 105  98 - 111 mmol/L Final  . CO2 01/08/2019 28  22 - 32 mmol/L Final  . Glucose, Bld 01/08/2019 104* 70 - 99 mg/dL Final  . BUN 01/08/2019 17  8 - 23 mg/dL Final  . Creatinine, Ser 01/08/2019 0.90  0.44 - 1.00 mg/dL Final  . Calcium 01/08/2019 10.2  8.9 - 10.3 mg/dL Final  . Total Protein 01/08/2019 7.5  6.5 - 8.1 g/dL Final  . Albumin 01/08/2019 4.1  3.5 - 5.0 g/dL Final  . AST 01/08/2019 17  15 - 41 U/L Final  . ALT 01/08/2019 12  0 - 44 U/L Final  . Alkaline Phosphatase 01/08/2019 93  38 - 126 U/L Final  . Total Bilirubin 01/08/2019 0.9  0.3 - 1.2 mg/dL Final  . GFR calc non Af Amer 01/08/2019 >60  >60 mL/min Final  . GFR calc Af Amer 01/08/2019 >60  >60 mL/min Final  . Anion gap 01/08/2019 9  5 - 15 Final   Performed at [facility], [place] [address] [place], [place] [address]  . Prothrombin Time 01/08/2019 13.0  11.4 - 15.2 seconds Final  . INR 01/08/2019 1.0  0.8 - 1.2 Final   Comment: (NOTE) INR goal varies based on device and disease states. Performed at [facility], [place] [address] [place], [place] [address]   .  ABO/RH(D) 01/08/2019 O POS   Final  . Antibody Screen 01/08/2019 NEG   Final  . Sample Expiration 01/08/2019 01/17/2019,2359   Final  . Extend sample reason 01/08/2019    Final                   Value:NO TRANSFUSIONS OR PREGNANCY IN THE PAST 3 MONTHS Performed at [place] [address],  [place], [place] [address]   . ABO/RH(D) 01/08/2019    Final                   Value:O POS Performed at [facility], [place] [address] [place], [place] [address]      X-Rays:No results found.  EKG: Orders placed or performed during the hospital encounter of 04/05/18  . EKG 12 lead  . EKG 12 lead     Hospital Course: Stephanie Alvarado is a 83 y.o. who was admitted to [facility]. They were brought to the operating room on 01/14/2019 and underwent Procedure(s): TOTAL KNEE ARTHROPLASTY.  Patient tolerated the procedure well and was later transferred to the recovery room and then to the orthopaedic floor for postoperative care. They were given PO and IV analgesics for pain control following their surgery. They were given 24 hours of postoperative antibiotics of  Anti-infectives (From admission, onward)   Start     Dose/Rate Route Frequency Ordered Stop   01/14/19 1600  ceFAZolin (ANCEF) IVPB 2g/100 mL premix     2 g 200 mL/hr over 30 Minutes Intravenous Every 6 hours 01/14/19 1203 01/14/19 2140   01/14/19 0700  ceFAZolin (ANCEF) IVPB 2g/100 mL premix     2 g 200 mL/hr over 30 Minutes Intravenous On call to O.R. 01/14/19 0981 01/14/19 0924     and started on DVT prophylaxis in the form of Xarelto.   PT and OT were ordered for total joint protocol. Discharge planning consulted to help with postop disposition and equipment needs. Patient had a good night on the evening of surgery. They started to get up OOB with therapy on POD #0. Hemovac drain was pulled without difficulty on day one. Continued to work with therapy into POD #2. Pt was seen during rounds on day two and was ready to go home pending progress with therapy. Dressing was changed and the incision was clean, dry, and intact with no drainage. Pt worked with therapy for two additional sessions and was meeting their goals. She was discharged to home later that day in stable condition.  Diet: Cardiac diet Activity: WBAT  Follow-up: in 2 weeks Disposition: Home with HHPT Discharged Condition: stable   Discharge Instructions    Call MD / Call 911   Complete by: As directed    If you experience chest pain or shortness of breath, CALL 911 and be transported to the hospital emergency room.  If you develope a fever above 101 F, pus (white drainage) or increased drainage or redness at the wound, or calf pain, call your surgeon's office.   Change dressing   Complete by: As directed    Change the dressing daily with sterile 4 x 4 inch gauze dressing and apply TED hose.   Constipation Prevention   Complete by: As directed    Drink plenty of fluids.  Prune juice may be helpful.  You may use a stool softener, such as Colace (over the counter) 100 mg twice a day.  Use MiraLax (over the counter) for constipation as  needed.   Diet - low sodium heart healthy   Complete by: As directed    Discharge instructions   Complete by: As directed    Dr. Gaynelle Arabian Total Joint Specialist [facility] [address], [place], [place] [address] [phone]  TOTAL KNEE REPLACEMENT POSTOPERATIVE DIRECTIONS  Knee Rehabilitation, Guidelines Following Surgery  Results after knee surgery are often greatly improved when you follow the exercise, range of motion and muscle strengthening exercises prescribed by your doctor. Safety measures are also important to protect the knee from further injury. Any time any of these exercises cause you to have increased pain or swelling in your knee joint, decrease the amount until you are comfortable again and slowly increase them. If you have problems or questions, call your caregiver or physical therapist for advice.   HOME CARE INSTRUCTIONS  Remove items at home which could result in a fall. This includes throw rugs or furniture in walking pathways.  ICE to the affected knee every three hours for 30 minutes at a time and then as needed for pain and swelling.  Continue to use ice on  the knee for pain and swelling from surgery. You may notice swelling that will progress down to the foot and ankle.  This is normal after surgery.  Elevate the leg when you are not up walking on it.   Continue to use the breathing machine which will help keep your temperature down.  It is common for your temperature to cycle up and down following surgery, especially at night when you are not up moving around and exerting yourself.  The breathing machine keeps your lungs expanded and your temperature down. Do not place pillow under knee, focus on keeping the knee straight while resting   DIET You may resume your previous home diet once your are discharged from the hospital.  DRESSING / WOUND CARE / SHOWERING You may change your dressing 3-5 days after surgery.  Then change the dressing every day with sterile gauze.  Please use good hand washing techniques before changing the dressing.  Do not use any lotions or creams on the incision until instructed by your surgeon. You may start showering once you are discharged home but do not submerge the incision under water. Just pat the incision dry and apply a dry gauze dressing on daily. Change the surgical dressing daily and reapply a dry dressing each time.  ACTIVITY Walk with your walker as instructed. Use walker as long as suggested by your caregivers. Avoid periods of inactivity such as sitting longer than an hour when not asleep. This helps prevent blood clots.  You may resume a sexual relationship in one month or when given the OK by your doctor.  You may return to work once you are cleared by your doctor.  Do not drive a car for 6 weeks or until released by you surgeon.  Do not drive while taking narcotics.  WEIGHT BEARING Weight bearing as tolerated with assist device (walker, cane, etc) as directed, use it as long as suggested by your surgeon or therapist, typically at least 4-6 weeks.  POSTOPERATIVE CONSTIPATION PROTOCOL Constipation -  defined medically as fewer than three stools per week and severe constipation as less than one stool per week.  One of the most common issues patients have following surgery is constipation.  Even if you have a regular bowel pattern at home, your normal regimen is likely to be disrupted due to multiple reasons following surgery.  Combination  of anesthesia, postoperative narcotics, change in appetite and fluid intake all can affect your bowels.  In order to avoid complications following surgery, here are some recommendations in order to help you during your recovery period.  Colace (docusate) - Pick up an over-the-counter form of Colace or another stool softener and take twice a day as long as you are requiring postoperative pain medications.  Take with a full glass of water daily.  If you experience loose stools or diarrhea, hold the colace until you stool forms back up.  If your symptoms do not get better within 1 week or if they get worse, check with your doctor.  Dulcolax (bisacodyl) - Pick up over-the-counter and take as directed by the product packaging as needed to assist with the movement of your bowels.  Take with a full glass of water.  Use this product as needed if not relieved by Colace only.   MiraLax (polyethylene glycol) - Pick up over-the-counter to have on hand.  MiraLax is a solution that will increase the amount of water in your bowels to assist with bowel movements.  Take as directed and can mix with a glass of water, juice, soda, coffee, or tea.  Take if you go more than two days without a movement. Do not use MiraLax more than once per day. Call your doctor if you are still constipated or irregular after using this medication for 7 days in a row.  If you continue to have problems with postoperative constipation, please contact the office for further assistance and recommendations.  If you experience "the worst abdominal pain ever" or develop nausea or vomiting, please contact the  office immediatly for further recommendations for treatment.  ITCHING  If you experience itching with your medications, try taking only a single pain pill, or even half a pain pill at a time.  You can also use Benadryl over the counter for itching or also to help with sleep.   TED HOSE STOCKINGS Wear the elastic stockings on both legs for three weeks following surgery during the day but you may remove then at night for sleeping.  MEDICATIONS See your medication summary on the "After Visit Summary" that the nursing staff will review with you prior to discharge.  You may have some home medications which will be placed on hold until you complete the course of blood thinner medication.  It is important for you to complete the blood thinner medication as prescribed by your surgeon.  Continue your approved medications as instructed at time of discharge.  PRECAUTIONS If you experience chest pain or shortness of breath - call 911 immediately for transfer to the hospital emergency department.  If you develop a fever greater that 101 F, purulent drainage from wound, increased redness or drainage from wound, foul odor from the wound/dressing, or calf pain - CONTACT YOUR SURGEON.                                                   FOLLOW-UP APPOINTMENTS Make sure you keep all of your appointments after your operation with your surgeon and caregivers. You should call the office at the above phone number and make an appointment for approximately two weeks after the date of your surgery or on the date instructed by your surgeon outlined in the "After Visit Summary".   RANGE OF MOTION  AND STRENGTHENING EXERCISES  Rehabilitation of the knee is important following a knee injury or an operation. After just a few days of immobilization, the muscles of the thigh which control the knee become weakened and shrink (atrophy). Knee exercises are designed to build up the tone and strength of the thigh muscles and to improve  knee motion. Often times heat used for twenty to thirty minutes before working out will loosen up your tissues and help with improving the range of motion but do not use heat for the first two weeks following surgery. These exercises can be done on a training (exercise) mat, on the floor, on a table or on a bed. Use what ever works the best and is most comfortable for you Knee exercises include:  Leg Lifts - While your knee is still immobilized in a splint or cast, you can do straight leg raises. Lift the leg to 60 degrees, hold for 3 sec, and slowly lower the leg. Repeat 10-20 times 2-3 times daily. Perform this exercise against resistance later as your knee gets better.  Quad and Hamstring Sets - Tighten up the muscle on the front of the thigh (Quad) and hold for 5-10 sec. Repeat this 10-20 times hourly. Hamstring sets are done by pushing the foot backward against an object and holding for 5-10 sec. Repeat as with quad sets.  Leg Slides: Lying on your back, slowly slide your foot toward your buttocks, bending your knee up off the floor (only go as far as is comfortable). Then slowly slide your foot back down until your leg is flat on the floor again. Angel Wings: Lying on your back spread your legs to the side as far apart as you can without causing discomfort.  A rehabilitation program following serious knee injuries can speed recovery and prevent re-injury in the future due to weakened muscles. Contact your doctor or a physical therapist for more information on knee rehabilitation.   IF YOU ARE TRANSFERRED TO A SKILLED REHAB FACILITY If the patient is transferred to a skilled rehab facility following release from the hospital, a list of the current medications will be sent to the facility for the patient to continue.  When discharged from the skilled rehab facility, please have the facility set up the patient's [place] prior to being released. Also, the skilled facility will be  responsible for providing the patient with their medications at time of release from the facility to include their pain medication, the muscle relaxants, and their blood thinner medication. If the patient is still at the rehab facility at time of the two week follow up appointment, the skilled rehab facility will also need to assist the patient in arranging follow up appointment in our office and any transportation needs.  MAKE SURE YOU:  Understand these instructions.  Get help right away if you are not doing well or get worse.    Pick up stool softner and laxative for home use following surgery while on pain medications. Do not submerge incision under water. Please use good hand washing techniques while changing dressing each day. May shower starting three days after surgery. Please use a clean towel to pat the incision dry following showers. Continue to use ice for pain and swelling after surgery. Do not use any lotions or creams on the incision until instructed by your surgeon.   Do not put a pillow under the knee. Place it under the heel.   Complete by: As directed  Driving restrictions   Complete by: As directed    No driving for two weeks   TED hose   Complete by: As directed    Use stockings (TED hose) for three weeks on both leg(s).  You may remove them at night for sleeping.   Weight bearing as tolerated   Complete by: As directed      Allergies as of 01/16/2019      Reactions   Codeine Itching   REACTION: itch      Medication List    STOP taking these medications   aspirin EC 81 MG tablet   Melatonin 3 MG Tabs     TAKE these medications   amLODipine 5 MG tablet Commonly known as: NORVASC Take 5 mg by mouth daily.   Artificial Tears 1.4 % ophthalmic solution Generic drug: polyvinyl alcohol Place 1 drop into both eyes as needed for dry eyes.   buPROPion 150 MG 24 hr tablet Commonly known as: WELLBUTRIN XL Take 300 mg by mouth daily.   furosemide 40 MG  tablet Commonly known as: LASIX Take 40 mg by mouth daily.   gabapentin 300 MG capsule Commonly known as: NEURONTIN Take 600 mg by mouth 3 (three) times daily.   isosorbide mononitrate 30 MG 24 hr tablet Commonly known as: IMDUR Take 30 mg by mouth at bedtime.   methocarbamol 500 MG tablet Commonly known as: ROBAXIN Take 1 tablet (500 mg total) by mouth every 6 (six) hours as needed for muscle spasms.   metoprolol tartrate 50 MG tablet Commonly known as: LOPRESSOR Take 50 mg by mouth 2 (two) times daily.   nitroGLYCERIN 0.4 MG SL tablet Commonly known as: NITROSTAT Place 0.4 mg under the tongue every 5 (five) minutes as needed for chest pain (TAKE UP TO 3 DOSES BEFORE CALLING 911).   oxyCODONE 5 MG immediate release tablet Commonly known as: Oxy IR/ROXICODONE Take 1-2 tablets (5-10 mg total) by mouth every 6 (six) hours as needed for moderate pain.   OXYGEN Inhale 2 L into the lungs at bedtime as needed.   pantoprazole 40 MG tablet Commonly known as: PROTONIX Take 40 mg by mouth 2 (two) times daily.   polyethylene glycol powder 17 GM/SCOOP powder Commonly known as: GLYCOLAX/MIRALAX Take 17 g by mouth daily as needed for moderate constipation.   rivaroxaban 10 MG Tabs tablet Commonly known as: XARELTO Take 1 tablet (10 mg total) by mouth daily with breakfast. Then resume home dose of Aspirin 81 mg daily.   rosuvastatin 10 MG tablet Commonly known as: CRESTOR Take 10 mg by mouth at bedtime.            Discharge Care Instructions  (From admission, onward)         Start     Ordered   01/15/19 0000  Weight bearing as tolerated     01/15/19 0731   01/15/19 0000  Change dressing    Comments: Change the dressing daily with sterile 4 x 4 inch gauze dressing and apply TED hose.   01/15/19 0731         Follow-up Information    Gaynelle Arabian, MD. Schedule an appointment as soon as possible for a visit on 01/29/2019.   Specialty: Orthopedic Surgery Contact  information: [address] [place] [address] [phone]           Signed: Theresa Duty, PA-C Orthopedic Surgery 01/21/2019, 7:28 AM

## 2019-01-24 DIAGNOSIS — E669 Obesity, unspecified: Secondary | ICD-10-CM | POA: Diagnosis not present

## 2019-01-24 DIAGNOSIS — Z9981 Dependence on supplemental oxygen: Secondary | ICD-10-CM | POA: Diagnosis not present

## 2019-01-24 DIAGNOSIS — Z6830 Body mass index (BMI) 30.0-30.9, adult: Secondary | ICD-10-CM | POA: Diagnosis not present

## 2019-01-24 DIAGNOSIS — F419 Anxiety disorder, unspecified: Secondary | ICD-10-CM | POA: Diagnosis not present

## 2019-01-24 DIAGNOSIS — Z79891 Long term (current) use of opiate analgesic: Secondary | ICD-10-CM | POA: Diagnosis not present

## 2019-01-24 DIAGNOSIS — G4733 Obstructive sleep apnea (adult) (pediatric): Secondary | ICD-10-CM | POA: Diagnosis not present

## 2019-01-24 DIAGNOSIS — E039 Hypothyroidism, unspecified: Secondary | ICD-10-CM | POA: Diagnosis not present

## 2019-01-24 DIAGNOSIS — N183 Chronic kidney disease, stage 3 (moderate): Secondary | ICD-10-CM | POA: Diagnosis not present

## 2019-01-24 DIAGNOSIS — I129 Hypertensive chronic kidney disease with stage 1 through stage 4 chronic kidney disease, or unspecified chronic kidney disease: Secondary | ICD-10-CM | POA: Diagnosis not present

## 2019-01-24 DIAGNOSIS — Z7982 Long term (current) use of aspirin: Secondary | ICD-10-CM | POA: Diagnosis not present

## 2019-01-24 DIAGNOSIS — I499 Cardiac arrhythmia, unspecified: Secondary | ICD-10-CM | POA: Diagnosis not present

## 2019-01-24 DIAGNOSIS — Z471 Aftercare following joint replacement surgery: Secondary | ICD-10-CM | POA: Diagnosis not present

## 2019-01-24 DIAGNOSIS — J9611 Chronic respiratory failure with hypoxia: Secondary | ICD-10-CM | POA: Diagnosis not present

## 2019-01-24 DIAGNOSIS — I700 Atherosclerosis of aorta: Secondary | ICD-10-CM | POA: Diagnosis not present

## 2019-01-24 DIAGNOSIS — M1712 Unilateral primary osteoarthritis, left knee: Secondary | ICD-10-CM | POA: Diagnosis not present

## 2019-01-24 DIAGNOSIS — F331 Major depressive disorder, recurrent, moderate: Secondary | ICD-10-CM | POA: Diagnosis not present

## 2019-01-24 DIAGNOSIS — J449 Chronic obstructive pulmonary disease, unspecified: Secondary | ICD-10-CM | POA: Diagnosis not present

## 2019-01-24 DIAGNOSIS — Z9049 Acquired absence of other specified parts of digestive tract: Secondary | ICD-10-CM | POA: Diagnosis not present

## 2019-01-24 DIAGNOSIS — E782 Mixed hyperlipidemia: Secondary | ICD-10-CM | POA: Diagnosis not present

## 2019-01-24 DIAGNOSIS — Z8673 Personal history of transient ischemic attack (TIA), and cerebral infarction without residual deficits: Secondary | ICD-10-CM | POA: Diagnosis not present

## 2019-01-24 DIAGNOSIS — I2510 Atherosclerotic heart disease of native coronary artery without angina pectoris: Secondary | ICD-10-CM | POA: Diagnosis not present

## 2019-01-24 DIAGNOSIS — Z96651 Presence of right artificial knee joint: Secondary | ICD-10-CM | POA: Diagnosis not present

## 2019-01-24 DIAGNOSIS — K219 Gastro-esophageal reflux disease without esophagitis: Secondary | ICD-10-CM | POA: Diagnosis not present

## 2019-01-24 DIAGNOSIS — E349 Endocrine disorder, unspecified: Secondary | ICD-10-CM | POA: Diagnosis not present

## 2019-01-24 DIAGNOSIS — Z9861 Coronary angioplasty status: Secondary | ICD-10-CM | POA: Diagnosis not present

## 2019-01-25 DIAGNOSIS — M66861 Spontaneous rupture of other tendons, right lower leg: Secondary | ICD-10-CM | POA: Diagnosis not present

## 2019-01-25 DIAGNOSIS — M419 Scoliosis, unspecified: Secondary | ICD-10-CM | POA: Diagnosis not present

## 2019-01-25 DIAGNOSIS — R269 Unspecified abnormalities of gait and mobility: Secondary | ICD-10-CM | POA: Diagnosis not present

## 2019-01-25 DIAGNOSIS — Q7649 Other congenital malformations of spine, not associated with scoliosis: Secondary | ICD-10-CM | POA: Diagnosis not present

## 2019-01-28 DIAGNOSIS — F419 Anxiety disorder, unspecified: Secondary | ICD-10-CM | POA: Diagnosis not present

## 2019-01-28 DIAGNOSIS — I25111 Atherosclerotic heart disease of native coronary artery with angina pectoris with documented spasm: Secondary | ICD-10-CM | POA: Diagnosis not present

## 2019-01-28 DIAGNOSIS — R7301 Impaired fasting glucose: Secondary | ICD-10-CM | POA: Diagnosis not present

## 2019-01-28 DIAGNOSIS — Z7901 Long term (current) use of anticoagulants: Secondary | ICD-10-CM | POA: Diagnosis not present

## 2019-01-28 DIAGNOSIS — I700 Atherosclerosis of aorta: Secondary | ICD-10-CM | POA: Diagnosis not present

## 2019-01-28 DIAGNOSIS — G4733 Obstructive sleep apnea (adult) (pediatric): Secondary | ICD-10-CM | POA: Diagnosis not present

## 2019-01-28 DIAGNOSIS — J9611 Chronic respiratory failure with hypoxia: Secondary | ICD-10-CM | POA: Diagnosis not present

## 2019-01-28 DIAGNOSIS — M1712 Unilateral primary osteoarthritis, left knee: Secondary | ICD-10-CM | POA: Diagnosis not present

## 2019-01-28 DIAGNOSIS — M545 Low back pain: Secondary | ICD-10-CM | POA: Diagnosis not present

## 2019-01-28 DIAGNOSIS — Z471 Aftercare following joint replacement surgery: Secondary | ICD-10-CM | POA: Diagnosis not present

## 2019-01-28 DIAGNOSIS — I8391 Asymptomatic varicose veins of right lower extremity: Secondary | ICD-10-CM | POA: Diagnosis not present

## 2019-01-28 DIAGNOSIS — K219 Gastro-esophageal reflux disease without esophagitis: Secondary | ICD-10-CM | POA: Diagnosis not present

## 2019-01-28 DIAGNOSIS — Z9981 Dependence on supplemental oxygen: Secondary | ICD-10-CM | POA: Diagnosis not present

## 2019-01-28 DIAGNOSIS — J441 Chronic obstructive pulmonary disease with (acute) exacerbation: Secondary | ICD-10-CM | POA: Diagnosis not present

## 2019-01-28 DIAGNOSIS — Z7982 Long term (current) use of aspirin: Secondary | ICD-10-CM | POA: Diagnosis not present

## 2019-01-28 DIAGNOSIS — N183 Chronic kidney disease, stage 3 (moderate): Secondary | ICD-10-CM | POA: Diagnosis not present

## 2019-01-28 DIAGNOSIS — E782 Mixed hyperlipidemia: Secondary | ICD-10-CM | POA: Diagnosis not present

## 2019-01-28 DIAGNOSIS — I1310 Hypertensive heart and chronic kidney disease without heart failure, with stage 1 through stage 4 chronic kidney disease, or unspecified chronic kidney disease: Secondary | ICD-10-CM | POA: Diagnosis not present

## 2019-01-28 DIAGNOSIS — E669 Obesity, unspecified: Secondary | ICD-10-CM | POA: Diagnosis not present

## 2019-01-28 DIAGNOSIS — I499 Cardiac arrhythmia, unspecified: Secondary | ICD-10-CM | POA: Diagnosis not present

## 2019-01-28 DIAGNOSIS — Z79891 Long term (current) use of opiate analgesic: Secondary | ICD-10-CM | POA: Diagnosis not present

## 2019-01-28 DIAGNOSIS — Z6830 Body mass index (BMI) 30.0-30.9, adult: Secondary | ICD-10-CM | POA: Diagnosis not present

## 2019-01-28 DIAGNOSIS — F331 Major depressive disorder, recurrent, moderate: Secondary | ICD-10-CM | POA: Diagnosis not present

## 2019-01-28 DIAGNOSIS — E349 Endocrine disorder, unspecified: Secondary | ICD-10-CM | POA: Diagnosis not present

## 2019-01-28 DIAGNOSIS — E039 Hypothyroidism, unspecified: Secondary | ICD-10-CM | POA: Diagnosis not present

## 2019-02-11 DIAGNOSIS — E782 Mixed hyperlipidemia: Secondary | ICD-10-CM | POA: Diagnosis not present

## 2019-02-11 DIAGNOSIS — E039 Hypothyroidism, unspecified: Secondary | ICD-10-CM | POA: Diagnosis not present

## 2019-02-11 DIAGNOSIS — I10 Essential (primary) hypertension: Secondary | ICD-10-CM | POA: Diagnosis not present

## 2019-02-12 DIAGNOSIS — M25661 Stiffness of right knee, not elsewhere classified: Secondary | ICD-10-CM | POA: Diagnosis not present

## 2019-02-15 DIAGNOSIS — M25661 Stiffness of right knee, not elsewhere classified: Secondary | ICD-10-CM | POA: Diagnosis not present

## 2019-02-19 DIAGNOSIS — Z471 Aftercare following joint replacement surgery: Secondary | ICD-10-CM | POA: Diagnosis not present

## 2019-02-19 DIAGNOSIS — Z96651 Presence of right artificial knee joint: Secondary | ICD-10-CM | POA: Diagnosis not present

## 2019-02-20 DIAGNOSIS — M25661 Stiffness of right knee, not elsewhere classified: Secondary | ICD-10-CM | POA: Diagnosis not present

## 2019-02-22 DIAGNOSIS — M25661 Stiffness of right knee, not elsewhere classified: Secondary | ICD-10-CM | POA: Diagnosis not present

## 2019-02-25 ENCOUNTER — Encounter: Payer: Self-pay | Admitting: Physician Assistant

## 2019-02-25 DIAGNOSIS — M419 Scoliosis, unspecified: Secondary | ICD-10-CM | POA: Diagnosis not present

## 2019-02-25 DIAGNOSIS — M25661 Stiffness of right knee, not elsewhere classified: Secondary | ICD-10-CM | POA: Diagnosis not present

## 2019-02-25 DIAGNOSIS — Q7649 Other congenital malformations of spine, not associated with scoliosis: Secondary | ICD-10-CM | POA: Diagnosis not present

## 2019-02-25 DIAGNOSIS — M66861 Spontaneous rupture of other tendons, right lower leg: Secondary | ICD-10-CM | POA: Diagnosis not present

## 2019-02-25 DIAGNOSIS — N183 Chronic kidney disease, stage 3 (moderate): Secondary | ICD-10-CM | POA: Diagnosis not present

## 2019-02-25 DIAGNOSIS — R269 Unspecified abnormalities of gait and mobility: Secondary | ICD-10-CM | POA: Diagnosis not present

## 2019-02-25 DIAGNOSIS — E782 Mixed hyperlipidemia: Secondary | ICD-10-CM | POA: Diagnosis not present

## 2019-02-25 DIAGNOSIS — R5383 Other fatigue: Secondary | ICD-10-CM | POA: Diagnosis not present

## 2019-02-25 DIAGNOSIS — K210 Gastro-esophageal reflux disease with esophagitis: Secondary | ICD-10-CM | POA: Diagnosis not present

## 2019-02-25 DIAGNOSIS — I10 Essential (primary) hypertension: Secondary | ICD-10-CM | POA: Diagnosis not present

## 2019-02-25 DIAGNOSIS — E039 Hypothyroidism, unspecified: Secondary | ICD-10-CM | POA: Diagnosis not present

## 2019-02-25 DIAGNOSIS — R7301 Impaired fasting glucose: Secondary | ICD-10-CM | POA: Diagnosis not present

## 2019-02-25 DIAGNOSIS — J449 Chronic obstructive pulmonary disease, unspecified: Secondary | ICD-10-CM | POA: Diagnosis not present

## 2019-02-27 DIAGNOSIS — M25661 Stiffness of right knee, not elsewhere classified: Secondary | ICD-10-CM | POA: Diagnosis not present

## 2019-02-28 DIAGNOSIS — I25111 Atherosclerotic heart disease of native coronary artery with angina pectoris with documented spasm: Secondary | ICD-10-CM | POA: Diagnosis not present

## 2019-02-28 DIAGNOSIS — Z1212 Encounter for screening for malignant neoplasm of rectum: Secondary | ICD-10-CM | POA: Diagnosis not present

## 2019-02-28 DIAGNOSIS — Z6830 Body mass index (BMI) 30.0-30.9, adult: Secondary | ICD-10-CM | POA: Diagnosis not present

## 2019-02-28 DIAGNOSIS — I8391 Asymptomatic varicose veins of right lower extremity: Secondary | ICD-10-CM | POA: Diagnosis not present

## 2019-02-28 DIAGNOSIS — J449 Chronic obstructive pulmonary disease, unspecified: Secondary | ICD-10-CM | POA: Diagnosis not present

## 2019-02-28 DIAGNOSIS — I10 Essential (primary) hypertension: Secondary | ICD-10-CM | POA: Diagnosis not present

## 2019-02-28 DIAGNOSIS — J9611 Chronic respiratory failure with hypoxia: Secondary | ICD-10-CM | POA: Diagnosis not present

## 2019-02-28 DIAGNOSIS — Z0001 Encounter for general adult medical examination with abnormal findings: Secondary | ICD-10-CM | POA: Diagnosis not present

## 2019-03-01 DIAGNOSIS — M25661 Stiffness of right knee, not elsewhere classified: Secondary | ICD-10-CM | POA: Diagnosis not present

## 2019-03-04 DIAGNOSIS — M25661 Stiffness of right knee, not elsewhere classified: Secondary | ICD-10-CM | POA: Diagnosis not present

## 2019-03-06 ENCOUNTER — Telehealth: Payer: Self-pay | Admitting: Cardiovascular Disease

## 2019-03-06 DIAGNOSIS — M25661 Stiffness of right knee, not elsewhere classified: Secondary | ICD-10-CM | POA: Diagnosis not present

## 2019-03-06 NOTE — Telephone Encounter (Signed)
New Message  Pt c/o Shortness Of Breath: STAT if SOB developed within the last 24 hours or pt is noticeably SOB on the phone  1. Are you currently SOB (can you hear that pt is SOB on the phone)? Yes  2. How long have you been experiencing SOB? Last Couple Weeks  3. Are you SOB when sitting or when up moving around? Up and moving around  4. Are you currently experiencing any other symptoms? Patient stated she is also fatigued, states that she was told that she has 2 blockages in her carotid by the [employer] nurse.    Appointment is scheduled for 03/08/19 with Richardson Dopp PA-C at 8:45 am.

## 2019-03-06 NOTE — Telephone Encounter (Signed)
LM for pt to call back and to keep her appt 03/07/19 with Daune Perch NP.

## 2019-03-07 ENCOUNTER — Ambulatory Visit: Payer: PPO | Admitting: Cardiology

## 2019-03-07 ENCOUNTER — Encounter: Payer: Self-pay | Admitting: Cardiology

## 2019-03-07 ENCOUNTER — Other Ambulatory Visit: Payer: Self-pay

## 2019-03-07 VITALS — BP 138/68 | HR 59 | Ht 67.0 in | Wt 198.8 lb

## 2019-03-07 DIAGNOSIS — Z79899 Other long term (current) drug therapy: Secondary | ICD-10-CM | POA: Diagnosis not present

## 2019-03-07 DIAGNOSIS — E785 Hyperlipidemia, unspecified: Secondary | ICD-10-CM

## 2019-03-07 DIAGNOSIS — I6529 Occlusion and stenosis of unspecified carotid artery: Secondary | ICD-10-CM | POA: Diagnosis not present

## 2019-03-07 DIAGNOSIS — R0602 Shortness of breath: Secondary | ICD-10-CM

## 2019-03-07 DIAGNOSIS — I2510 Atherosclerotic heart disease of native coronary artery without angina pectoris: Secondary | ICD-10-CM

## 2019-03-07 DIAGNOSIS — R0989 Other specified symptoms and signs involving the circulatory and respiratory systems: Secondary | ICD-10-CM | POA: Diagnosis not present

## 2019-03-07 DIAGNOSIS — I10 Essential (primary) hypertension: Secondary | ICD-10-CM

## 2019-03-07 NOTE — Progress Notes (Addendum)
Cardiology Office Note:    Date:  03/07/2019   ID:  [name], DOB [birth year], MRN [id number]  PCP:  Caryl Bis, MD  Cardiologist:  Sherren Mocha, MD  Referring MD: Caryl Bis, MD   Chief Complaint  Patient presents with  . Shortness of Breath  . Coronary Artery Disease    History of Present Illness:    Stephanie Alvarado is a 78 y.o. female with a past medical history significant for CAD s/p PCI (last in 123XX123), diastolic dsyfunction, carotid disease, HTN, HLD, MVP, obesity, stroke, atherosclerosis of aorta, subclavian artery disease, mild carotid artery disease and OSA on CPAP.  Patient had stroke in 2010 and found to have very mild PFO on TEE. Dr. Burt Knack had cleared her to stop Plavix. She is now on aspirin monotherapy. At last cath 03/2017, "No high-grade coronary obstruction or abnormal hemodynamics to explain the severity of dyspnea. The distal margin of the LAD stent is bordering on significant.  Needs to be followed over time for progression."  S/P knee replacement 01/14/2019. Was on xarelto post op for 19 days.   Patient is being seen for complaints of shortness of breath.  She has a history LVH and chronic diastolic dysfunction, but EF 55-60% by echo in 11/2015.  Stephanie Alvarado is here today for evaluation of shortness of breath. She uses oxygen as needed for COPD. She uses CPAP every night. Over the last 2 weeks her shortness of breath is much more pronounced. No chest pain or pressure. She gets short of breath walking for one room to the other and has no energy. She has trouble doing light house work like washing a few dishes, has to stop and rest. She has mild edema up to sock line, left slightly more than right. No orthopnea, she sleeps on a flat bed with 1 pillow.   She also has left lower quadrant abdominal pain intermittently.   Patient had a recent visit by a home health assessment nurse practitioner and was told that she has blockages in her neck arteries.  Lipid  panel obtained from PCP office from 02/25/2019: Total cholesterol 145, triglycerides 104, HDL 56, LDL 70   Cardiac studies   Cardiac catheterization 04/10/2017:  Widely patent stent in the mid RCA.60% ostial stenosis in the continuation of the RCA beyond the PDA.  Patent LAD stent with distal third 60-70% in-stent restenosis. 90% first septal perforator.  30-40% mid circumflex  Low normal left ventricular systolic function with estimated EF 50%. Normal LV hemodynamics.  Normal right heart pressures with mean pulmonary capillary wedge pressure of 10 mmHg.  RECOMMENDATIONS:  No high-grade coronary obstruction or abnormal hemodynamics to explain the severity of dyspnea.  The distal margin of the LAD stent is bordering on significant. Needs to be followed over time for progression.  Diagnostic Dominance: Co-dominant   Carotid ultrasound 12/19/2016 Heterogeneous plaque, bilaterally. Stable 1-39% RICA stenosis. Essentially stable 123456 LICA stenosis. >50% LECA stenosis.  Normal right subclavian artery. Patent left CCA to subclavian artery bypass graft. Patent vertebral arteries with antegrade flow. Abnormal thyroid appearance (see above) - suggest dedicated thyroid US if clinically indicated. Recommend follow up 1 year  Past Medical History:  Diagnosis Date  . Achilles tendon rupture, right, initial encounter   . Allergic rhinitis, cause unspecified   . Anemia, unspecified   . Anxiety state, unspecified   . Atherosclerosis of aorta ([place])    TEE, June, 2010, grade 4 aortic arch atherosclerosis , Dr. Aundra Dubin  .  CAD (coronary artery disease)    DES and circumflex 2006 /  DES to LAD 2011  . Carotid artery disease ([place])    Doppler, November, 2011, stable, 0-39% bilateral, turbulent flow left subclavian  . Chronic diastolic heart failure ([place])   . Cyst of thyroid   . Diverticulosis of colon (without mention of hemorrhage)   . Dysphasia    Possibly esophageal stricture  .  Ejection fraction     EF 60%, Echo, November 12, 2008, /  EF 55%, TEE, November 17, 2008  . Esophageal reflux   . Hiatal hernia   . Hyperlipemia   . Hypertension   . Lumbago   . Lumbar disc disease   . MVP (mitral valve prolapse)   . Nonspecific abnormal finding in stool contents   . Obesity, unspecified   . Osteoarthrosis, unspecified whether generalized or localized, unspecified site   . Osteopenia   . Other diseases of lung, not elsewhere classified   . Palpitations    October, 2012  . Peripheral vascular disease, unspecified ([place])   . Personal history of colonic polyps    ADENOMATOUS POLYP  . PFO (patent foramen ovale)    Patient had a very small PFO by TEE 2010.  This was seen only by bubble analysis during Valsalva  . Shortness of breath   . Sleep apnea    uses CPAP nightly  . Sleep related hypoventilation/hypoxemia in conditions classifiable elsewhere   . Stroke [facility]) 11/2008   Treated with TPA? /     2010, TEE no left atrial clot, probably from atherosclerosis of the aortic arch  . Subclavian artery disease [facility])    Remote surgery by Dr. Victorino Dike.  Marland Kitchen Unspecified cerebral artery occlusion with cerebral infarction   . Unspecified venous (peripheral) insufficiency     Past Surgical History:  Procedure Laterality Date  . ACHILLES TENDON SURGERY Right 04/05/2018   Procedure: Right achilles tendon reconstruction;  Surgeon: Wylene Simmer, MD;  Location: [facility];  Service: Orthopedics;  Laterality: Right;  69min  . BACK SURGERY    . cataracts     both eyes  . CHOLECYSTECTOMY    . CORONARY ANGIOPLASTY WITH STENT PLACEMENT  1990's  . GASTROCNEMIUS RECESSION Right 04/05/2018   Procedure: Gastroc recession;  Surgeon: Wylene Simmer, MD;  Location: [facility];  Service: Orthopedics;  Laterality: Right;  . LAPAROSCOPIC HYSTERECTOMY    . LUMBAR DISC SURGERY     X 3  . RIGHT/LEFT HEART CATH AND CORONARY ANGIOGRAPHY N/A 04/10/2017   Procedure: RIGHT/LEFT  HEART CATH AND CORONARY ANGIOGRAPHY;  Surgeon: Belva Crome, MD;  Location: [place] [facility];  Service: Cardiovascular;  Laterality: N/A;  . TOTAL KNEE ARTHROPLASTY Right 01/14/2019   Procedure: TOTAL KNEE ARTHROPLASTY;  Surgeon: Gaynelle Arabian, MD;  Location: [facility];  Service: Orthopedics;  Laterality: Right;  23min  . urethral suspension  1993   Dr. Nori Riis  . VARICOSE VEIN SURGERY     x 2    Current Medications: Current Meds  Medication Sig  . buPROPion (WELLBUTRIN XL) 150 MG 24 hr tablet Take 300 mg by mouth daily.   . furosemide (LASIX) 40 MG tablet Take 40 mg by mouth daily.   Marland Kitchen gabapentin (NEURONTIN) 300 MG capsule Take 600 mg by mouth 3 (three) times daily.   . isosorbide mononitrate (IMDUR) 30 MG 24 hr tablet Take 30 mg by mouth at bedtime.   . metoprolol tartrate (LOPRESSOR) 50 MG tablet Take 50  mg by mouth 2 (two) times daily.   . nitroGLYCERIN (NITROSTAT) 0.4 MG SL tablet Place 0.4 mg under the tongue every 5 (five) minutes as needed for chest pain (TAKE UP TO 3 DOSES BEFORE CALLING 911).  . OXYGEN Inhale 2 L into the lungs at bedtime as needed.   . pantoprazole (PROTONIX) 40 MG tablet Take 40 mg by mouth 2 (two) times daily.   . polyethylene glycol powder (GLYCOLAX/MIRALAX) powder Take 17 g by mouth daily as needed for moderate constipation.   . polyvinyl alcohol (ARTIFICIAL TEARS) 1.4 % ophthalmic solution Place 1 drop into both eyes as needed for dry eyes.  . rosuvastatin (CRESTOR) 10 MG tablet Take 10 mg by mouth at bedtime.     Allergies:   Codeine   Social History   Socioeconomic History  . Marital status: Widowed    Spouse name: Not on file  . Number of children: 4  . Years of education: Not on file  . Highest education level: Not on file  Occupational History  . Occupation: Retired    Fish farm manager: RETIRED  Social Needs  . Financial resource strain: Not on file  . Food insecurity    Worry: Not on file    Inability: Not on file  . Transportation needs     Medical: Not on file    Non-medical: Not on file  Tobacco Use  . Smoking status: Former Smoker    Packs/day: 1.00    Years: 40.00    Pack years: 40.00    Types: Cigarettes    Quit date: 06/13/1998    Years since quitting: 20.7  . Smokeless tobacco: Never Used  Substance and Sexual Activity  . Alcohol use: No    Alcohol/week: 0.0 standard drinks  . Drug use: No  . Sexual activity: Not on file  Lifestyle  . Physical activity    Days per week: Not on file    Minutes per session: Not on file  . Stress: Not on file  Relationships  . Social Herbalist on phone: Not on file    Gets together: Not on file    Attends religious service: Not on file    Active member of club or organization: Not on file    Attends meetings of clubs or organizations: Not on file    Relationship status: Not on file  Other Topics Concern  . Not on file  Social History Narrative  . Not on file     Family History: The patient's family history includes Alzheimer's disease in her mother; Breast cancer in an other family member; Colon cancer in her paternal aunt; Heart attack in her father; Heart disease in her father. There is no history of Esophageal cancer, Rectal cancer, or Stomach cancer. ROS:   Please see the history of present illness.     All other systems reviewed and are negative.   EKG:  EKG is ordered today.  The ekg ordered today demonstrates sinus bradycardia, 59 bpm, no ischemic changes.  Recent Labs: 01/08/2019: ALT 12 01/16/2019: BUN 16; Creatinine, Ser 0.78; Hemoglobin 10.7; Platelets 159; Potassium 4.0; Sodium 140   Recent Lipid Panel    Component Value Date/Time   CHOL 160 05/31/2016 0641   TRIG 144 05/31/2016 0641   TRIG 135 03/28/2006 0936   HDL 73 05/31/2016 0641   CHOLHDL 2.2 05/31/2016 0641   VLDL 29 05/31/2016 0641   LDLCALC 58 05/31/2016 0641   LDLDIRECT 123.3 04/29/2010 0000  Physical Exam:    VS:  BP 138/68   Pulse (!) 59   Ht 5\' 7"  (1.702 m)   Wt 198  lb 12.8 oz (90.2 kg)   SpO2 99%   BMI 31.14 kg/m     Wt Readings from Last 6 Encounters:  03/07/19 198 lb 12.8 oz (90.2 kg)  01/14/19 209 lb 3.5 oz (94.9 kg)  01/08/19 195 lb (88.5 kg)  08/08/18 203 lb 1.9 oz (92.1 kg)  04/05/18 207 lb 3.7 oz (94 kg)  03/26/18 195 lb (88.5 kg)     Physical Exam  Constitutional: She is oriented to person, place, and time. She appears well-developed and well-nourished. No distress.  HENT:  Head: Normocephalic and atraumatic.  Neck: Normal range of motion. Neck supple. No JVD present. Carotid bruit is present.  Cardiovascular: Normal rate, regular rhythm, normal heart sounds and intact distal pulses. Exam reveals no gallop and no friction rub.  No murmur heard. Pulmonary/Chest: Effort normal and breath sounds normal. No respiratory distress. She has no wheezes. She has no rales.  Abdominal: Soft. Bowel sounds are normal.  Musculoskeletal: Normal range of motion.        General: Edema present.     Comments: Bilateral trace pitting edema up to the sock line, L>R  Neurological: She is alert and oriented to person, place, and time.  Skin: Skin is warm and dry.  Psychiatric: She has a normal mood and affect. Her behavior is normal. Judgment and thought content normal.  Vitals reviewed.    ASSESSMENT:    1. SOB (shortness of breath)   2. Coronary artery disease involving native coronary artery of native heart without angina pectoris   3. Essential (primary) hypertension   4. Hyperlipidemia, unspecified hyperlipidemia type   5. Stenosis of carotid artery, unspecified laterality   6. Left carotid bruit   7. Medication management    PLAN:    In order of problems listed above:  Shortness of breath -Has chronic shortness of breath due to COPD, uses oxygen as needed.  She has had increased shortness of breath and fatigue over the last 2 weeks.  No chest pain/pressure.  She has sock line edema but no orthopnea. -We will check BNP today.  If BNP is  elevated I will double her Lasix dose to 80 mg x 3 days then back to 40 mg.  We will also check an echocardiogram for changes in LV function and valves. -If her BNP is normal and echocardiogram is normal.  I will order a nuclear stress test to rule out anginal equivalent.  CAD -Now on aspirin, without plavix. On statin  Essential hypertension -On lasix 40 mg daily, Imdur, Lopressor 50 mg BID. Amlodipine was stopped on 9/14 due to soft BP, per pt.  -Blood pressure was elevated on presentation but improved after sitting.  Patient advised to continue to monitor blood pressure with goal less than 130/80.  Hyperlpidemia -On rosuvastatin 10 mg daily -Lipid panel recently drawn per PCP on 02/25/2019: TC 145, triglycerides 104, HDL 56, LDL 70. -Well-controlled, continue current therapy.  Carotid artery disease -Bilateral 1-39% in 12/2016.  -Left carotid bruit present. -Repeat carotid U/S  Medication Adjustments/Labs and Tests Ordered: Current medicines are reviewed at length with the patient today.  Concerns regarding medicines are outlined above. Labs and tests ordered and medication changes are outlined in the patient instructions below:  Patient Instructions  Medication Instructions:  Your physician recommends that you continue on your current medications as directed. Please  refer to the Current Medication list given to you today.  If you need a refill on your cardiac medications before your next appointment, please call your pharmacy.   Lab work: TODAY: BMET & BNP   If you have labs (blood work) drawn today and your tests are completely normal, you will receive your results only by: Marland Kitchen MyChart Message (if you have MyChart) OR . A paper copy in the mail If you have any lab test that is abnormal or we need to change your treatment, we will call you to review the results.  Testing/Procedures: Your physician has requested that you have an echocardiogram. Echocardiography is a painless test  that uses sound waves to create images of your heart. It provides your doctor with information about the size and shape of your heart and how well your heart's chambers and valves are working. This procedure takes approximately one hour. There are no restrictions for this procedure.  Your physician has requested that you have a carotid duplex. This test is an ultrasound of the carotid arteries in your neck. It looks at blood flow through these arteries that supply the brain with blood. Allow one hour for this exam. There are no restrictions or special instructions.    Follow-Up: Your physician wants you to follow-up in: in 6 months with Dr. Burt Knack. You will receive a reminder letter in the mail two months in advance. If you don't receive a letter, please call our office to schedule the follow-up appointment.   Any Other Special Instructions Will Be Listed Below (If Applicable).   Lifestyle Modifications to Prevent and Treat Heart Disease -Recommend heart healthy/Mediterranean diet, with whole grains, fruits, vegetables, fish, lean meats, nuts, olive oil and avocado oil.  -Limit salt intake to less than 2000 mg per day.  -Recommend moderate walking, starting slowly with a few minutes and working up to 3-5 times/week for 30-50 minutes each session. Aim for at least 150 minutes.week. Goal should be pace of 3 miles/hours, or walking 1.5 miles in 30 minutes -Recommend avoidance of tobacco products. Avoid excess alcohol. -Keep blood pressure well controlled, ideally less than 130/80.      Signed, Daune Perch, NP  03/07/2019 4:21 PM    [facility]

## 2019-03-07 NOTE — Patient Instructions (Addendum)
Medication Instructions:  Your physician recommends that you continue on your current medications as directed. Please refer to the Current Medication list given to you today.  If you need a refill on your cardiac medications before your next appointment, please call your pharmacy.   Lab work: TODAY: BMET & BNP   If you have labs (blood work) drawn today and your tests are completely normal, you will receive your results only by: Marland Kitchen MyChart Message (if you have MyChart) OR . A paper copy in the mail If you have any lab test that is abnormal or we need to change your treatment, we will call you to review the results.  Testing/Procedures: Your physician has requested that you have an echocardiogram. Echocardiography is a painless test that uses sound waves to create images of your heart. It provides your doctor with information about the size and shape of your heart and how well your heart's chambers and valves are working. This procedure takes approximately one hour. There are no restrictions for this procedure.  Your physician has requested that you have a carotid duplex. This test is an ultrasound of the carotid arteries in your neck. It looks at blood flow through these arteries that supply the brain with blood. Allow one hour for this exam. There are no restrictions or special instructions.    Follow-Up: Your physician wants you to follow-up in: in 6 months with Dr. Burt Knack. You will receive a reminder letter in the mail two months in advance. If you don't receive a letter, please call our office to schedule the follow-up appointment.   Any Other Special Instructions Will Be Listed Below (If Applicable).   Lifestyle Modifications to Prevent and Treat Heart Disease -Recommend heart healthy/Mediterranean diet, with whole grains, fruits, vegetables, fish, lean meats, nuts, olive oil and avocado oil.  -Limit salt intake to less than 2000 mg per day.  -Recommend moderate walking, starting  slowly with a few minutes and working up to 3-5 times/week for 30-50 minutes each session. Aim for at least 150 minutes.week. Goal should be pace of 3 miles/hours, or walking 1.5 miles in 30 minutes -Recommend avoidance of tobacco products. Avoid excess alcohol. -Keep blood pressure well controlled, ideally less than 130/80.

## 2019-03-08 ENCOUNTER — Ambulatory Visit: Payer: PPO | Admitting: Physician Assistant

## 2019-03-08 DIAGNOSIS — M25661 Stiffness of right knee, not elsewhere classified: Secondary | ICD-10-CM | POA: Diagnosis not present

## 2019-03-08 LAB — PRO B NATRIURETIC PEPTIDE: NT-Pro BNP: 261 pg/mL (ref 0–738)

## 2019-03-08 LAB — BASIC METABOLIC PANEL
BUN/Creatinine Ratio: 15 (ref 12–28)
BUN: 13 mg/dL (ref 8–27)
CO2: 23 mmol/L (ref 20–29)
Calcium: 9.6 mg/dL (ref 8.7–10.3)
Chloride: 104 mmol/L (ref 96–106)
Creatinine, Ser: 0.85 mg/dL (ref 0.57–1.00)
GFR calc Af Amer: 75 mL/min/{1.73_m2} (ref >59)
GFR calc non Af Amer: 65 mL/min/{1.73_m2} (ref >59)
Glucose: 85 mg/dL (ref 65–99)
Potassium: 4.6 mmol/L (ref 3.5–5.2)
Sodium: 143 mmol/L (ref 134–144)

## 2019-03-11 ENCOUNTER — Telehealth: Payer: Self-pay

## 2019-03-11 DIAGNOSIS — R0609 Other forms of dyspnea: Secondary | ICD-10-CM

## 2019-03-11 DIAGNOSIS — I2510 Atherosclerotic heart disease of native coronary artery without angina pectoris: Secondary | ICD-10-CM

## 2019-03-11 DIAGNOSIS — M25661 Stiffness of right knee, not elsewhere classified: Secondary | ICD-10-CM | POA: Diagnosis not present

## 2019-03-11 NOTE — Telephone Encounter (Addendum)
Pt made aware of results. Pt verbalized understanding. Orders for stress test have been placed.   ----- Message from Daune Perch, NP sent at 03/08/2019  7:35 AM EDT ----- BNP was not elevated indicating no significant volume overload, however with her lower leg edema I think she would benefit from 2 days of doubling her Lasix.  Then she can go back to her usual dosing.  Kidney function and potassium are normal. -Due to no significant evidence of volume overload I would order a Lexiscan Myoview to evaluate for myocardial ischemia as the cause of her shortness of breath.  Daune Perch, NP

## 2019-03-12 ENCOUNTER — Ambulatory Visit (HOSPITAL_COMMUNITY): Payer: PPO | Attending: Cardiology

## 2019-03-12 ENCOUNTER — Ambulatory Visit (HOSPITAL_BASED_OUTPATIENT_CLINIC_OR_DEPARTMENT_OTHER): Payer: PPO

## 2019-03-12 ENCOUNTER — Other Ambulatory Visit: Payer: Self-pay

## 2019-03-12 DIAGNOSIS — R0602 Shortness of breath: Secondary | ICD-10-CM

## 2019-03-12 DIAGNOSIS — R0609 Other forms of dyspnea: Secondary | ICD-10-CM

## 2019-03-12 DIAGNOSIS — I2510 Atherosclerotic heart disease of native coronary artery without angina pectoris: Secondary | ICD-10-CM

## 2019-03-12 DIAGNOSIS — I1310 Hypertensive heart and chronic kidney disease without heart failure, with stage 1 through stage 4 chronic kidney disease, or unspecified chronic kidney disease: Secondary | ICD-10-CM | POA: Diagnosis not present

## 2019-03-12 DIAGNOSIS — I700 Atherosclerosis of aorta: Secondary | ICD-10-CM | POA: Diagnosis not present

## 2019-03-12 DIAGNOSIS — I25111 Atherosclerotic heart disease of native coronary artery with angina pectoris with documented spasm: Secondary | ICD-10-CM | POA: Diagnosis not present

## 2019-03-12 DIAGNOSIS — Z471 Aftercare following joint replacement surgery: Secondary | ICD-10-CM | POA: Diagnosis not present

## 2019-03-12 DIAGNOSIS — N183 Chronic kidney disease, stage 3 (moderate): Secondary | ICD-10-CM | POA: Diagnosis not present

## 2019-03-12 DIAGNOSIS — M1712 Unilateral primary osteoarthritis, left knee: Secondary | ICD-10-CM | POA: Diagnosis not present

## 2019-03-12 DIAGNOSIS — J9611 Chronic respiratory failure with hypoxia: Secondary | ICD-10-CM | POA: Diagnosis not present

## 2019-03-12 DIAGNOSIS — J441 Chronic obstructive pulmonary disease with (acute) exacerbation: Secondary | ICD-10-CM | POA: Diagnosis not present

## 2019-03-12 LAB — MYOCARDIAL PERFUSION IMAGING
LV dias vol: 97 mL (ref 46–106)
LV sys vol: 33 mL
Peak HR: 77 {beats}/min
Rest HR: 56 {beats}/min
SDS: 2
SRS: 0
SSS: 2
TID: 1.04

## 2019-03-12 LAB — ECHOCARDIOGRAM COMPLETE
Height: 67 in
Weight: 3168 oz

## 2019-03-12 MED ORDER — TECHNETIUM TC 99M TETROFOSMIN IV KIT
31.9000 | PACK | Freq: Once | INTRAVENOUS | Status: AC | PRN
  Administered 2019-03-12: 31.9 via INTRAVENOUS
  Filled 2019-03-12: qty 32

## 2019-03-12 MED ORDER — REGADENOSON 0.4 MG/5ML IV SOLN
0.4000 mg | Freq: Once | INTRAVENOUS | Status: AC
  Administered 2019-03-12: 0.4 mg via INTRAVENOUS

## 2019-03-12 MED ORDER — TECHNETIUM TC 99M TETROFOSMIN IV KIT
10.2000 | PACK | Freq: Once | INTRAVENOUS | Status: AC | PRN
  Administered 2019-03-12: 10.2 via INTRAVENOUS
  Filled 2019-03-12: qty 11

## 2019-03-13 ENCOUNTER — Telehealth: Payer: Self-pay

## 2019-03-13 DIAGNOSIS — R0602 Shortness of breath: Secondary | ICD-10-CM

## 2019-03-13 DIAGNOSIS — E782 Mixed hyperlipidemia: Secondary | ICD-10-CM | POA: Diagnosis not present

## 2019-03-13 DIAGNOSIS — M25661 Stiffness of right knee, not elsewhere classified: Secondary | ICD-10-CM | POA: Diagnosis not present

## 2019-03-13 DIAGNOSIS — E039 Hypothyroidism, unspecified: Secondary | ICD-10-CM | POA: Diagnosis not present

## 2019-03-13 NOTE — Telephone Encounter (Signed)
Reviewed results with patient who verbalized understanding. Pt would like me to refer her to a pulmonologist. Orders have been placed in Epic and a copy has been sent to Dr. Quillian Quince.

## 2019-03-19 ENCOUNTER — Ambulatory Visit (HOSPITAL_COMMUNITY)
Admission: RE | Admit: 2019-03-19 | Discharge: 2019-03-19 | Disposition: A | Discharge status: Home / Self Care | Payer: PPO | Source: Ambulatory Visit | Attending: Cardiovascular Disease | Admitting: Cardiovascular Disease

## 2019-03-19 ENCOUNTER — Other Ambulatory Visit: Payer: Self-pay

## 2019-03-19 DIAGNOSIS — R0989 Other specified symptoms and signs involving the circulatory and respiratory systems: Secondary | ICD-10-CM

## 2019-03-21 ENCOUNTER — Telehealth: Payer: Self-pay | Admitting: Cardiology

## 2019-03-21 NOTE — Telephone Encounter (Signed)
New message   Patient is returning call about carotid artery test results. Please call.

## 2019-03-21 NOTE — Telephone Encounter (Signed)
Pt aware of results 

## 2019-04-12 DIAGNOSIS — E782 Mixed hyperlipidemia: Secondary | ICD-10-CM | POA: Diagnosis not present

## 2019-04-12 DIAGNOSIS — I10 Essential (primary) hypertension: Secondary | ICD-10-CM | POA: Diagnosis not present

## 2019-05-13 DIAGNOSIS — I10 Essential (primary) hypertension: Secondary | ICD-10-CM | POA: Diagnosis not present

## 2019-05-13 DIAGNOSIS — E782 Mixed hyperlipidemia: Secondary | ICD-10-CM | POA: Diagnosis not present

## 2019-07-02 DIAGNOSIS — H348312 Tributary (branch) retinal vein occlusion, right eye, stable: Secondary | ICD-10-CM | POA: Diagnosis not present

## 2019-07-02 DIAGNOSIS — H40013 Open angle with borderline findings, low risk, bilateral: Secondary | ICD-10-CM | POA: Diagnosis not present

## 2019-07-02 DIAGNOSIS — H35363 Drusen (degenerative) of macula, bilateral: Secondary | ICD-10-CM | POA: Diagnosis not present

## 2019-07-02 DIAGNOSIS — H43813 Vitreous degeneration, bilateral: Secondary | ICD-10-CM | POA: Diagnosis not present

## 2019-07-04 DIAGNOSIS — M179 Osteoarthritis of knee, unspecified: Secondary | ICD-10-CM | POA: Diagnosis not present

## 2019-07-04 DIAGNOSIS — Z471 Aftercare following joint replacement surgery: Secondary | ICD-10-CM | POA: Diagnosis not present

## 2019-07-04 DIAGNOSIS — Z96651 Presence of right artificial knee joint: Secondary | ICD-10-CM | POA: Diagnosis not present

## 2019-07-04 DIAGNOSIS — M1712 Unilateral primary osteoarthritis, left knee: Secondary | ICD-10-CM | POA: Diagnosis not present

## 2019-07-12 DIAGNOSIS — E7849 Other hyperlipidemia: Secondary | ICD-10-CM | POA: Diagnosis not present

## 2019-07-12 DIAGNOSIS — I10 Essential (primary) hypertension: Secondary | ICD-10-CM | POA: Diagnosis not present

## 2019-07-26 DIAGNOSIS — E782 Mixed hyperlipidemia: Secondary | ICD-10-CM | POA: Diagnosis not present

## 2019-07-26 DIAGNOSIS — R7301 Impaired fasting glucose: Secondary | ICD-10-CM | POA: Diagnosis not present

## 2019-07-31 DIAGNOSIS — Z6829 Body mass index (BMI) 29.0-29.9, adult: Secondary | ICD-10-CM | POA: Diagnosis not present

## 2019-07-31 DIAGNOSIS — I700 Atherosclerosis of aorta: Secondary | ICD-10-CM | POA: Diagnosis not present

## 2019-07-31 DIAGNOSIS — R7301 Impaired fasting glucose: Secondary | ICD-10-CM | POA: Diagnosis not present

## 2019-07-31 DIAGNOSIS — I10 Essential (primary) hypertension: Secondary | ICD-10-CM | POA: Diagnosis not present

## 2019-07-31 DIAGNOSIS — J9611 Chronic respiratory failure with hypoxia: Secondary | ICD-10-CM | POA: Diagnosis not present

## 2019-07-31 DIAGNOSIS — I8391 Asymptomatic varicose veins of right lower extremity: Secondary | ICD-10-CM | POA: Diagnosis not present

## 2019-08-06 DIAGNOSIS — H40013 Open angle with borderline findings, low risk, bilateral: Secondary | ICD-10-CM | POA: Diagnosis not present

## 2019-08-06 DIAGNOSIS — H16223 Keratoconjunctivitis sicca, not specified as Sjogren's, bilateral: Secondary | ICD-10-CM | POA: Diagnosis not present

## 2019-08-06 DIAGNOSIS — H04123 Dry eye syndrome of bilateral lacrimal glands: Secondary | ICD-10-CM | POA: Diagnosis not present

## 2019-08-06 DIAGNOSIS — H43813 Vitreous degeneration, bilateral: Secondary | ICD-10-CM | POA: Diagnosis not present

## 2019-08-06 DIAGNOSIS — H35033 Hypertensive retinopathy, bilateral: Secondary | ICD-10-CM | POA: Diagnosis not present

## 2019-08-09 DIAGNOSIS — I10 Essential (primary) hypertension: Secondary | ICD-10-CM | POA: Diagnosis not present

## 2019-08-09 DIAGNOSIS — E7849 Other hyperlipidemia: Secondary | ICD-10-CM | POA: Diagnosis not present

## 2019-08-10 ENCOUNTER — Ambulatory Visit: Payer: PPO | Attending: Internal Medicine

## 2019-08-10 DIAGNOSIS — Z23 Encounter for immunization: Secondary | ICD-10-CM | POA: Insufficient documentation

## 2019-08-10 NOTE — Progress Notes (Signed)
   Covid-19 Vaccination Clinic  Name:  Stephanie Alvarado    MRN: [id number] DOB: 28-Sep-1940  08/10/2019  [name] was observed post Covid-19 immunization for 15 minutes without incidence. She was provided with Vaccine Information Sheet and instruction to access the V-Safe system.   [name] was instructed to call 911 with any severe reactions post vaccine: Marland Kitchen Difficulty breathing  . Swelling of your face and throat  . A fast heartbeat  . A bad rash all over your body  . Dizziness and weakness    Immunizations Administered    Name Date Dose VIS Date Route   Pfizer COVID-19 Vaccine 08/10/2019  5:16 PM 0.3 mL 05/24/2019 Intramuscular   Manufacturer: [place]   Lot: [id number]   [place]: [id number]

## 2019-08-22 DIAGNOSIS — I8391 Asymptomatic varicose veins of right lower extremity: Secondary | ICD-10-CM | POA: Diagnosis not present

## 2019-08-22 DIAGNOSIS — I10 Essential (primary) hypertension: Secondary | ICD-10-CM | POA: Diagnosis not present

## 2019-08-22 DIAGNOSIS — I25111 Atherosclerotic heart disease of native coronary artery with angina pectoris with documented spasm: Secondary | ICD-10-CM | POA: Diagnosis not present

## 2019-08-22 DIAGNOSIS — E782 Mixed hyperlipidemia: Secondary | ICD-10-CM | POA: Diagnosis not present

## 2019-08-22 DIAGNOSIS — J449 Chronic obstructive pulmonary disease, unspecified: Secondary | ICD-10-CM | POA: Diagnosis not present

## 2019-08-22 DIAGNOSIS — I700 Atherosclerosis of aorta: Secondary | ICD-10-CM | POA: Diagnosis not present

## 2019-08-22 DIAGNOSIS — J9611 Chronic respiratory failure with hypoxia: Secondary | ICD-10-CM | POA: Diagnosis not present

## 2019-09-06 ENCOUNTER — Ambulatory Visit: Payer: PPO | Admitting: Cardiovascular Disease

## 2019-09-06 ENCOUNTER — Other Ambulatory Visit: Payer: Self-pay

## 2019-09-06 ENCOUNTER — Encounter: Payer: Self-pay | Admitting: Cardiovascular Disease

## 2019-09-06 VITALS — BP 136/64 | HR 64 | Resp 15 | Ht 68.0 in | Wt 198.2 lb

## 2019-09-06 DIAGNOSIS — I2510 Atherosclerotic heart disease of native coronary artery without angina pectoris: Secondary | ICD-10-CM

## 2019-09-06 DIAGNOSIS — E782 Mixed hyperlipidemia: Secondary | ICD-10-CM | POA: Diagnosis not present

## 2019-09-06 NOTE — Progress Notes (Signed)
Cardiology Office Note:    Date:  09/06/2019   ID:  Stephanie Alvarado, DOB May 20, 1941, MRN [id number]  PCP:  Caryl Bis, MD  Cardiologist:  Sherren Mocha, MD  Electrophysiologist:  None   Referring MD: Caryl Bis, MD   Chief Complaint  Patient presents with  . Coronary Artery Disease    History of Present Illness:    Stephanie Alvarado is a 79 y.o. female with a hx of coronary artery disease, presenting for follow-up evaluation.  The patient CAD dates back to 2006 when she underwent PCI of the right coronary artery with a Zomax study stent.  In 2009 she developed recurrent symptoms and was found to have progressive stenosis in the proximal LAD, treated with a Promus drug-eluting stent at that time.  A nuclear stress scan in 2017 showed no significant ischemia.  Her last echocardiogram demonstrated normal LV systolic function with mild diastolic dysfunction.  The patient is here alone today.  She been doing pretty well from a cardiac perspective.  She denies any chest pain, chest pressure, heart palpitations, leg swelling, orthopnea, or PND.  She has had a lot of trouble with dizziness and gait instability recently.  Dr. Quillian Quince is working her up and she has had several tests done.  She is waiting approval for an MRI of the brain.  Past Medical History:  Diagnosis Date  . Achilles tendon rupture, right, initial encounter   . Allergic rhinitis, cause unspecified   . Anemia, unspecified   . Anxiety state, unspecified   . Atherosclerosis of aorta ([place])    TEE, June, 2010, grade 4 aortic arch atherosclerosis , Dr. Aundra Dubin  . CAD (coronary artery disease)    DES and circumflex 2006 /  DES to LAD 2011  . Carotid artery disease ([place])    Doppler, November, 2011, stable, 0-39% bilateral, turbulent flow left subclavian  . Chronic diastolic heart failure ([place])   . Cyst of thyroid   . Diverticulosis of colon (without mention of hemorrhage)   . Dysphasia    Possibly esophageal stricture  .  Ejection fraction     EF 60%, Echo, November 12, 2008, /  EF 55%, TEE, November 17, 2008  . Esophageal reflux   . Hiatal hernia   . Hyperlipemia   . Hypertension   . Lumbago   . Lumbar disc disease   . MVP (mitral valve prolapse)   . Nonspecific abnormal finding in stool contents   . Obesity, unspecified   . Osteoarthrosis, unspecified whether generalized or localized, unspecified site   . Osteopenia   . Other diseases of lung, not elsewhere classified   . Palpitations    October, 2012  . Peripheral vascular disease, unspecified ([place])   . Personal history of colonic polyps    ADENOMATOUS POLYP  . PFO (patent foramen ovale)    Patient had a very small PFO by TEE 2010.  This was seen only by bubble analysis during Valsalva  . Shortness of breath   . Sleep apnea    uses CPAP nightly  . Sleep related hypoventilation/hypoxemia in conditions classifiable elsewhere   . Stroke [facility]) 11/2008   Treated with TPA? /     2010, TEE no left atrial clot, probably from atherosclerosis of the aortic arch  . 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 Victorino Dike.  Marland Kitchen Unspecified cerebral artery occlusion with cerebral infarction   . Unspecified venous (peripheral) insufficiency     Past Surgical  History:  Procedure Laterality Date  . ACHILLES TENDON SURGERY Right 04/05/2018   Procedure: Right achilles tendon reconstruction;  Surgeon: Wylene Simmer, MD;  Location: [place];  Service: Orthopedics;  Laterality: Right;  73min  . BACK SURGERY    . cataracts     both eyes  . CHOLECYSTECTOMY    . CORONARY ANGIOPLASTY WITH STENT PLACEMENT  1990's  . GASTROCNEMIUS RECESSION Right 04/05/2018   Procedure: Gastroc recession;  Surgeon: Wylene Simmer, MD;  Location: [place];  Service: Orthopedics;  Laterality: Right;  . LAPAROSCOPIC HYSTERECTOMY    . LUMBAR DISC SURGERY     X 3  . RIGHT/LEFT HEART CATH AND CORONARY ANGIOGRAPHY N/A 04/10/2017   Procedure: RIGHT/LEFT  HEART CATH AND CORONARY ANGIOGRAPHY;  Surgeon: Belva Crome, MD;  Location: [place] [facility];  Service: Cardiovascular;  Laterality: N/A;  . TOTAL KNEE ARTHROPLASTY Right 01/14/2019   Procedure: TOTAL KNEE ARTHROPLASTY;  Surgeon: Gaynelle Arabian, MD;  Location: [facility];  Service: Orthopedics;  Laterality: Right;  36min  . urethral suspension  1993   Dr. Nori Riis  . VARICOSE VEIN SURGERY     x 2    Current Medications: Current Meds  Medication Sig  . aspirin EC 81 MG tablet Take 81 mg by mouth daily.  Marland Kitchen buPROPion (WELLBUTRIN XL) 150 MG 24 hr tablet Take 150 mg by mouth in the morning and at bedtime.   . furosemide (LASIX) 40 MG tablet Take 40 mg by mouth daily.   Marland Kitchen gabapentin (NEURONTIN) 300 MG capsule Take 600 mg by mouth 3 (three) times daily.   . isosorbide mononitrate (IMDUR) 30 MG 24 hr tablet Take 30 mg by mouth at bedtime.   . metoprolol tartrate (LOPRESSOR) 50 MG tablet Take 50 mg by mouth 2 (two) times daily.   . nitroGLYCERIN (NITROSTAT) 0.4 MG SL tablet Place 0.4 mg under the tongue every 5 (five) minutes as needed for chest pain (TAKE UP TO 3 DOSES BEFORE CALLING 911).  . OXYGEN Inhale 2 L into the lungs at bedtime as needed.   . pantoprazole (PROTONIX) 40 MG tablet Take 40 mg by mouth 2 (two) times daily.   . polyethylene glycol powder (GLYCOLAX/MIRALAX) powder Take 17 g by mouth daily as needed for moderate constipation.   . polyvinyl alcohol (ARTIFICIAL TEARS) 1.4 % ophthalmic solution Place 1 drop into both eyes as needed for dry eyes.  . rosuvastatin (CRESTOR) 10 MG tablet Take 10 mg by mouth at bedtime.     Allergies:   Codeine   Social History   Socioeconomic History  . Marital status: Widowed    Spouse name: Not on file  . Number of children: 4  . Years of education: Not on file  . Highest education level: Not on file  Occupational History  . Occupation: Retired    Fish farm manager: RETIRED  Tobacco Use  . Smoking status: Former Smoker    Packs/day: 1.00    Years:  40.00    Pack years: 40.00    Types: Cigarettes    Quit date: 06/13/1998    Years since quitting: 21.2  . Smokeless tobacco: Never Used  Substance and Sexual Activity  . Alcohol use: No    Alcohol/week: 0.0 standard drinks  . Drug use: No  . Sexual activity: Not on file  Other Topics Concern  . Not on file  Social History Narrative  . Not on file   Social Determinants of Health   Financial Resource Strain:   .  Difficulty of Paying Living Expenses:   Food Insecurity:   . Worried About Charity fundraiser in the Last Year:   . Arboriculturist in the Last Year:   Transportation Needs:   . Film/video editor (Medical):   Marland Kitchen Lack of Transportation (Non-Medical):   Physical Activity:   . Days of Exercise per Week:   . Minutes of Exercise per Session:   Stress:   . Feeling of Stress :   Social Connections:   . Frequency of Communication with Friends and Family:   . Frequency of Social Gatherings with Friends and Family:   . Attends Religious Services:   . Active Member of Clubs or Organizations:   . Attends Archivist Meetings:   Marland Kitchen Marital Status:      Family History: The patient's family history includes Alzheimer's disease in her mother; Breast cancer in an other family member; Colon cancer in her paternal aunt; Heart attack in her father; Heart disease in her father. There is no history of Esophageal cancer, Rectal cancer, or Stomach cancer.  ROS:   Please see the history of present illness.    All other systems reviewed and are negative.  EKGs/Labs/Other Studies Reviewed:    The following studies were reviewed today: Myoview Stress Test 03-12-2019: Study Highlights   Nuclear stress EF: 65%.  There was no ST segment deviation noted during stress.  No T wave inversion was noted during stress.  The study is normal.  This is a low risk study.  The left ventricular ejection fraction is normal (55-65%).    Echo 03/12/2019: IMPRESSIONS    1. Left  ventricular ejection fraction, by visual estimation, is 60 to  65%. The left ventricle has normal function. Normal left ventricular size.  There is no left ventricular hypertrophy.  2. Left ventricular diastolic Doppler parameters are indeterminate  pattern of LV diastolic filling.  3. Global right ventricle has normal systolic function.The right  ventricular size is mildly enlarged. No increase in right ventricular wall  thickness.  4. Left atrial size was moderately dilated.  5. Right atrial size was normal.  6. The mitral valve is normal in structure. Mild mitral valve  regurgitation. No evidence of mitral stenosis.  7. The tricuspid valve is normal in structure. Tricuspid valve  regurgitation is mild.  8. The aortic valve is normal in structure. Aortic valve regurgitation  was not visualized by color flow Doppler. Structurally normal aortic  valve, with no evidence of sclerosis or stenosis.  9. The pulmonic valve was normal in structure. Pulmonic valve  regurgitation is trivial by color flow Doppler.  10. Normal pulmonary artery systolic pressure.  11. The inferior vena cava is normal in size with greater than 50%  respiratory variability, suggesting right atrial pressure of 3 mmHg.  12. There is right bowing of the interatrial septum, suggestive of  elevated left atrial pressure.   Carotid US 03-19-2019: Summary:  Right Carotid: Velocities in the right ICA are consistent with a 1-39%  stenosis.         Non-hemodynamically significant plaque <50% noted in the  CCA.         Incidental right thyroid nodule seen. See above for  measurements.   Left Carotid: Velocities in the left ICA are consistent with a 1-39%  stenosis.        Non-hemodynamically significant plaque <50% noted in the  CCA. The        ECA appears >50% stenosed. The  velocity measurements in the        proximal CCA and subclavian arteries aren't reproducible  from          prior exam.   Vertebrals: Bilateral vertebral arteries demonstrate antegrade flow.  Subclavians: Normal flow hemodynamics were seen in bilateral subclavian        arteries.   EKG:  EKG is not ordered today.    Recent Labs: 01/08/2019: ALT 12 01/16/2019: Hemoglobin 10.7; Platelets 159 03/07/2019: BUN 13; Creatinine, Ser 0.85; NT-Pro BNP 261; Potassium 4.6; Sodium 143  Recent Lipid Panel    Component Value Date/Time   CHOL 160 05/31/2016 0641   TRIG 144 05/31/2016 0641   TRIG 135 03/28/2006 0936   HDL 73 05/31/2016 0641   CHOLHDL 2.2 05/31/2016 0641   VLDL 29 05/31/2016 0641   LDLCALC 58 05/31/2016 0641   LDLDIRECT 123.3 04/29/2010 0000    Physical Exam:    VS:  BP 136/64   Pulse 64   Resp 15   Ht 5\' 8"  (1.727 m)   Wt 198 lb 3.2 oz (89.9 kg)   SpO2 95%   BMI 30.14 kg/m     Wt Readings from Last 3 Encounters:  09/06/19 198 lb 3.2 oz (89.9 kg)  03/12/19 198 lb (89.8 kg)  03/07/19 198 lb 12.8 oz (90.2 kg)     GEN:  Well nourished, well developed in no acute distress HEENT: Normal NECK: No JVD; BL carotid bruits LYMPHATICS: No lymphadenopathy CARDIAC: RRR, no murmurs, rubs, gallops RESPIRATORY:  Clear to auscultation without rales, wheezing or rhonchi  ABDOMEN: Soft, non-tender, non-distended MUSCULOSKELETAL:  No edema; No deformity  SKIN: Warm and dry NEUROLOGIC:  Alert and oriented x 3 PSYCHIATRIC:  Normal affect   ASSESSMENT:    1. Coronary artery disease involving native coronary artery of native heart without angina pectoris   2. Mixed hyperlipidemia    PLAN:    In order of problems listed above:  1. Appears stable with no symptoms of angina on a medical regimen of isosorbide and metoprolol as well as aspirin 81 mg.  Recent testing reviewed as outlined above with normal perfusion scan and normal LV function by echo. 2. Lipids are excellent on rosuvastatin.  LDL cholesterol is 43 mg/dL.  Continue the same.   Medication  Adjustments/Labs and Tests Ordered: Current medicines are reviewed at length with the patient today.  Concerns regarding medicines are outlined above.  No orders of the defined types were placed in this encounter.  No orders of the defined types were placed in this encounter.   Patient Instructions  Medication Instructions:  Your provider recommends that you continue on your current medications as directed. Please refer to the Current Medication list given to you today.   *If you need a refill on your cardiac medications before your next appointment, please call your pharmacy*  Follow-Up: At [facility], you and your health needs are our priority.  As part of our continuing mission to provide you with exceptional heart care, we have created designated Provider Care Teams.  These Care Teams include your primary Cardiologist (physician) and Advanced Practice Providers (APPs -  Physician Assistants and Nurse Practitioners) who all work together to provide you with the care you need, when you need it. Your next appointment:   12 month(s) The format for your next appointment:   In Person Provider:   You may see Sherren Mocha, MD or one of the following Advanced Practice Providers on your designated Care Team:  Richardson Dopp, PA-C  [place], PA-C    Signed, Sherren Mocha, MD  09/06/2019 2:26 PM    [place]

## 2019-09-06 NOTE — Patient Instructions (Signed)

## 2019-09-11 DIAGNOSIS — I10 Essential (primary) hypertension: Secondary | ICD-10-CM | POA: Diagnosis not present

## 2019-09-11 DIAGNOSIS — E7849 Other hyperlipidemia: Secondary | ICD-10-CM | POA: Diagnosis not present

## 2019-09-13 ENCOUNTER — Other Ambulatory Visit: Payer: Self-pay | Admitting: Family Medicine

## 2019-09-13 DIAGNOSIS — R42 Dizziness and giddiness: Secondary | ICD-10-CM

## 2019-09-24 ENCOUNTER — Ambulatory Visit
Admission: RE | Admit: 2019-09-24 | Discharge: 2019-09-24 | Disposition: A | Discharge status: Home / Self Care | Payer: PPO | Source: Ambulatory Visit | Attending: Family Medicine | Admitting: Family Medicine

## 2019-09-24 ENCOUNTER — Other Ambulatory Visit: Payer: PPO

## 2019-09-24 DIAGNOSIS — R413 Other amnesia: Secondary | ICD-10-CM | POA: Diagnosis not present

## 2019-09-24 DIAGNOSIS — R42 Dizziness and giddiness: Secondary | ICD-10-CM

## 2019-10-11 DIAGNOSIS — J449 Chronic obstructive pulmonary disease, unspecified: Secondary | ICD-10-CM | POA: Diagnosis not present

## 2019-10-11 DIAGNOSIS — E7849 Other hyperlipidemia: Secondary | ICD-10-CM | POA: Diagnosis not present

## 2019-10-15 DIAGNOSIS — Z1231 Encounter for screening mammogram for malignant neoplasm of breast: Secondary | ICD-10-CM | POA: Diagnosis not present

## 2019-10-15 DIAGNOSIS — Z124 Encounter for screening for malignant neoplasm of cervix: Secondary | ICD-10-CM | POA: Diagnosis not present

## 2019-10-15 DIAGNOSIS — Z6831 Body mass index (BMI) 31.0-31.9, adult: Secondary | ICD-10-CM | POA: Diagnosis not present

## 2019-10-28 DIAGNOSIS — S22000A Wedge compression fracture of unspecified thoracic vertebra, initial encounter for closed fracture: Secondary | ICD-10-CM | POA: Diagnosis not present

## 2019-10-28 DIAGNOSIS — M25512 Pain in left shoulder: Secondary | ICD-10-CM | POA: Diagnosis not present

## 2019-10-28 DIAGNOSIS — M546 Pain in thoracic spine: Secondary | ICD-10-CM | POA: Diagnosis not present

## 2019-10-30 DIAGNOSIS — M5126 Other intervertebral disc displacement, lumbar region: Secondary | ICD-10-CM | POA: Diagnosis not present

## 2019-10-30 DIAGNOSIS — M4120 Other idiopathic scoliosis, site unspecified: Secondary | ICD-10-CM | POA: Diagnosis not present

## 2019-10-30 DIAGNOSIS — M545 Low back pain: Secondary | ICD-10-CM | POA: Diagnosis not present

## 2019-10-30 DIAGNOSIS — M75102 Unspecified rotator cuff tear or rupture of left shoulder, not specified as traumatic: Secondary | ICD-10-CM | POA: Diagnosis not present

## 2019-11-01 DIAGNOSIS — M1712 Unilateral primary osteoarthritis, left knee: Secondary | ICD-10-CM | POA: Diagnosis not present

## 2019-11-01 DIAGNOSIS — Z96651 Presence of right artificial knee joint: Secondary | ICD-10-CM | POA: Diagnosis not present

## 2019-11-05 DIAGNOSIS — H348312 Tributary (branch) retinal vein occlusion, right eye, stable: Secondary | ICD-10-CM | POA: Diagnosis not present

## 2019-11-05 DIAGNOSIS — H04123 Dry eye syndrome of bilateral lacrimal glands: Secondary | ICD-10-CM | POA: Diagnosis not present

## 2019-11-05 DIAGNOSIS — H40013 Open angle with borderline findings, low risk, bilateral: Secondary | ICD-10-CM | POA: Diagnosis not present

## 2019-11-05 DIAGNOSIS — H16223 Keratoconjunctivitis sicca, not specified as Sjogren's, bilateral: Secondary | ICD-10-CM | POA: Diagnosis not present

## 2019-11-06 ENCOUNTER — Telehealth: Payer: Self-pay | Admitting: *Deleted

## 2019-11-06 NOTE — Telephone Encounter (Signed)
   [facility] Pre-operative Risk Assessment    HEARTCARE STAFF: - Please ensure there is not already an duplicate clearance open for this procedure. - Under Visit Info/Reason for Call, type in Other and utilize the format Clearance MM/DD/YY or Clearance TBD. Do not use dashes or single digits. - If request is for dental extraction, please clarify the # of teeth to be extracted.  Request for surgical clearance:  1. What type of surgery is being performed? LEFT TOTAL KNEE ARTHROPLASTY   2. When is this surgery scheduled? 12/23/19   3. What type of clearance is required (medical clearance vs. Pharmacy clearance to hold med vs. Both)? MEDICAL  4. Are there any medications that need to be held prior to surgery and how long? ASA    5. Practice name and name of physician performing surgery? [facility]; DR. FRANK ALUISIO   6. What is the office phone number? [phone]   7.   What is the office fax number?  [phone] ATTN: Lydia  8.   Anesthesia type (None, local, MAC, general) ? CHOICE   Julaine Hua 11/06/2019, 9:40 AM  _________________________________________________________________   (provider comments below)

## 2019-11-06 NOTE — Telephone Encounter (Signed)
Ok from my standpoint to hold Aspirin for 7 days for surgery if needed. thanks

## 2019-11-06 NOTE — Telephone Encounter (Signed)
   Primary Cardiologist: Sherren Mocha, MD  Chart reviewed as part of pre-operative protocol coverage. Given past medical history and time since last visit, based on ACC/AHA guidelines, Stephanie Alvarado would be at acceptable risk for the planned procedure without further cardiovascular testing.   She may hold her aspirin for 7 days prior to her surgery. Please resume as soon as hemostasis is achieved  I will route this recommendation to the requesting party via Epic fax function and remove from pre-op pool.  Please call with questions.  Jossie Ng. Syvanna Ciolino NP-C    11/06/2019, 11:01 AM [address] Office [phone] Fax [phone]

## 2019-11-11 DIAGNOSIS — I129 Hypertensive chronic kidney disease with stage 1 through stage 4 chronic kidney disease, or unspecified chronic kidney disease: Secondary | ICD-10-CM | POA: Diagnosis not present

## 2019-11-11 DIAGNOSIS — E7849 Other hyperlipidemia: Secondary | ICD-10-CM | POA: Diagnosis not present

## 2019-11-11 DIAGNOSIS — Z87891 Personal history of nicotine dependence: Secondary | ICD-10-CM | POA: Diagnosis not present

## 2019-11-11 DIAGNOSIS — J441 Chronic obstructive pulmonary disease with (acute) exacerbation: Secondary | ICD-10-CM | POA: Diagnosis not present

## 2019-11-11 DIAGNOSIS — N183 Chronic kidney disease, stage 3 unspecified: Secondary | ICD-10-CM | POA: Diagnosis not present

## 2019-11-26 DIAGNOSIS — G4733 Obstructive sleep apnea (adult) (pediatric): Secondary | ICD-10-CM | POA: Diagnosis not present

## 2019-12-09 ENCOUNTER — Encounter (HOSPITAL_COMMUNITY): Payer: Self-pay

## 2019-12-09 NOTE — Progress Notes (Addendum)
COVID Vaccine Completed: Yes Date COVID Vaccine completed: 08/10/19  COVID vaccine manufacturer: Pfizer      PCP - Dr. Dub Amis surgical clearance 11/06/2019 in chart Cardiologist - Dr. Ezzie Dural 09/06/2019 last office visit 09/06/2019 in epic cardiac clearance note 11/06/2019 telephone encounter in epic  Chest x-ray - greater than 1 year EKG - 03/07/2019 in epic Stress Test - 03/12/2019 in epic ECHO - 03/12/2019 in epic Cardiac Cath -greater than 2 years   Sleep Study - Yes CPAP - Yes  Fasting Blood Sugar - N/A Checks Blood Sugar __N/A___ times a day  Blood Thinner Instructions: Aspirin Instructions: Yes Last Dose: 12/18/2019  Anesthesia review: CHF, OSA, CAD, Stroke, PFO, carotid and subclavian artery disease  Patient denies shortness of breath, fever, cough and chest pain at PAT appointment   Patient verbalized understanding of instructions that were given to them at the PAT appointment. Patient was also instructed that they will need to review over the PAT instructions again at home before surgery.

## 2019-12-09 NOTE — Patient Instructions (Addendum)
DUE TO COVID-19 ONLY ONE VISITOR ARE ALLOWED TO COME WITH YOU AND STAY IN THE WAITING ROOM ONLY DURING PRE OP AND PROCEDURE. THEN TWO VISITORS MAY VISIT WITH YOU IN YOUR PRIVATE ROOM DURING VISITING HOURS ONLY!!   COVID SWAB TESTING MUST BE COMPLETED ON:   Thursday December 19, 2019 at 3:10 PM [address], [place] -[facility] enter pre surgical testing line (Must self quarantine after testing. Follow instructions on handout.)         Your procedure is scheduled on: Monday, December 23, 2019   Report to [facility] Main  Entrance    Report to admitting at 8:00 AM   Call this number if you have problems the morning of surgery [phone]   Do not eat food:After Midnight.   May have liquids until 7:30 AM day of surgery   CLEAR LIQUID DIET  Foods Allowed                                                                     Foods Excluded  Water, Black Coffee and tea, regular and decaf                             liquids that you cannot  Plain Jell-O in any flavor  (No red)                                           see through such as: Fruit ices (not with fruit pulp)                                     milk, soups, orange juice  Iced Popsicles (No red)                                    All solid food                                   Apple juices Sports drinks like Gatorade (No red) Lightly seasoned clear broth or consume(fat free) Sugar, honey syrup  Sample Menu Breakfast                                Lunch                                     Supper Cranberry juice                    Beef broth                            Chicken broth Jell-O  Grape juice                           Apple juice Coffee or tea                        Jell-O                                      Popsicle                                                Coffee or tea                        Coffee or tea   Complete one Ensure drink the morning of  surgery at 7:30 AM  the day of surgery.   Oral Hygiene is also important to reduce your risk of infection.                                    Remember - BRUSH YOUR TEETH THE MORNING OF SURGERY WITH YOUR REGULAR TOOTHPASTE   Do NOT smoke after Midnight   Take these medicines the morning of surgery with A SIP OF WATER: Amlodipine, Bupropion, Gabapentin, Metoprolol, Pantoprazole  Bring CPAP mask and tubing day of surgery                               You may not have any metal on your body including hair pins, jewelry, and body piercings             Do not wear make-up, lotions, powders, perfumes/cologne, or deodorant             Do not wear nail polish.  Do not shave  48 hours prior to surgery.               Do not bring valuables to the hospital. [place].   Contacts, dentures or bridgework may not be worn into surgery.   Bring small overnight bag day of surgery.    Patients discharged the day of surgery will not be allowed to drive home.   Special Instructions: Bring a copy of your healthcare power of attorney and living will documents         the day of surgery if you haven't scanned them in before.              Please read over the following fact sheets you were given: IF YOU HAVE QUESTIONS ABOUT YOUR PRE OP INSTRUCTIONS PLEASE CALL [phone]   [facility] - Preparing for Surgery Before surgery, you can play an important role.  Because skin is not sterile, your skin needs to be as free of germs as possible.  You can reduce the number of germs on your skin by washing with CHG (chlorahexidine gluconate) soap before surgery.  CHG is an antiseptic cleaner which kills germs and bonds with the skin to continue killing  germs even after washing. Please DO NOT use if you have an allergy to CHG or antibacterial soaps.  If your skin becomes reddened/irritated stop using the CHG and inform your nurse when you arrive at Short Stay. Do not  shave (including legs and underarms) for at least 48 hours prior to the first CHG shower.  You may shave your face/neck.  Please follow these instructions carefully:  1.  Shower with CHG Soap the night before surgery and the  morning of surgery.  2.  If you choose to wash your hair, wash your hair first as usual with your normal  shampoo.  3.  After you shampoo, rinse your hair and body thoroughly to remove the shampoo.                             4.  Use CHG as you would any other liquid soap.  You can apply chg directly to the skin and wash.  Gently with a scrungie or clean washcloth.  5.  Apply the CHG Soap to your body ONLY FROM THE NECK DOWN.   Do   not use on face/ open                           Wound or open sores. Avoid contact with eyes, ears mouth and   genitals (private parts).                       Wash face,  Genitals (private parts) with your normal soap.             6.  Wash thoroughly, paying special attention to the area where your    surgery  will be performed.  7.  Thoroughly rinse your body with warm water from the neck down.  8.  DO NOT shower/wash with your normal soap after using and rinsing off the CHG Soap.                9.  Pat yourself dry with a clean towel.            10.  Wear clean pajamas.            11.  Place clean sheets on your bed the night of your first shower and do not  sleep with pets. Day of Surgery : Do not apply any lotions/deodorants the morning of surgery.  Please wear clean clothes to the hospital/surgery center.  FAILURE TO FOLLOW THESE INSTRUCTIONS MAY RESULT IN THE CANCELLATION OF YOUR SURGERY  PATIENT SIGNATURE_________________________________  NURSE SIGNATURE__________________________________  ________________________________________________________________________   Stephanie Alvarado  An incentive spirometer is a tool that can help keep your lungs clear and active. This tool measures how well you are filling your lungs with each  breath. Taking long deep breaths may help reverse or decrease the chance of developing breathing (pulmonary) problems (especially infection) following:  A long period of time when you are unable to move or be active. BEFORE THE PROCEDURE   If the spirometer includes an indicator to show your best effort, your nurse or respiratory therapist will set it to a desired goal.  If possible, sit up straight or lean slightly forward. Try not to slouch.  Hold the incentive spirometer in an upright position. INSTRUCTIONS FOR USE  1. Sit on the edge of your bed if possible, or sit up as far as you  can in bed or on a chair. 2. Hold the incentive spirometer in an upright position. 3. Breathe out normally. 4. Place the mouthpiece in your mouth and seal your lips tightly around it. 5. Breathe in slowly and as deeply as possible, raising the piston or the ball toward the top of the column. 6. Hold your breath for 3-5 seconds or for as long as possible. Allow the piston or ball to fall to the bottom of the column. 7. Remove the mouthpiece from your mouth and breathe out normally. 8. Rest for a few seconds and repeat Steps 1 through 7 at least 10 times every 1-2 hours when you are awake. Take your time and take a few normal breaths between deep breaths. 9. The spirometer may include an indicator to show your best effort. Use the indicator as a goal to work toward during each repetition. 10. After each set of 10 deep breaths, practice coughing to be sure your lungs are clear. If you have an incision (the cut made at the time of surgery), support your incision when coughing by placing a pillow or rolled up towels firmly against it. Once you are able to get out of bed, walk around indoors and cough well. You may stop using the incentive spirometer when instructed by your caregiver.  RISKS AND COMPLICATIONS  Take your time so you do not get dizzy or light-headed.  If you are in pain, you may need to take or ask  for pain medication before doing incentive spirometry. It is harder to take a deep breath if you are having pain. AFTER USE  Rest and breathe slowly and easily.  It can be helpful to keep track of a log of your progress. Your caregiver can provide you with a simple table to help with this. If you are using the spirometer at home, follow these instructions: [place] IF:   You are having difficultly using the spirometer.  You have trouble using the spirometer as often as instructed.  Your pain medication is not giving enough relief while using the spirometer.  You develop fever of 100.5 F (38.1 C) or higher. SEEK IMMEDIATE MEDICAL CARE IF:   You cough up bloody sputum that had not been present before.  You develop fever of 102 F (38.9 C) or greater.  You develop worsening pain at or near the incision site. MAKE SURE YOU:   Understand these instructions.  Will watch your condition.  Will get help right away if you are not doing well or get worse. Document Released: 10/10/2006 Document Revised: 08/22/2011 Document Reviewed: 12/11/2006 ExitCare Patient Information 2014 ExitCare, [place].   ________________________________________________________________________  WHAT IS A BLOOD TRANSFUSION? Blood Transfusion Information  A transfusion is the replacement of blood or some of its parts. Blood is made up of multiple cells which provide different functions.  Red blood cells carry oxygen and are used for blood loss replacement.  White blood cells fight against infection.  Platelets control bleeding.  Plasma helps clot blood.  Other blood products are available for specialized needs, such as hemophilia or other clotting disorders. BEFORE THE TRANSFUSION  Who gives blood for transfusions?   Healthy volunteers who are fully evaluated to make sure their blood is safe. This is blood bank blood. Transfusion therapy is the safest it has ever been in the practice of  medicine. Before blood is taken from a donor, a complete history is taken to make sure that person has no history of diseases nor  engages in risky social behavior (examples are intravenous drug use or sexual activity with multiple partners). The donor's travel history is screened to minimize risk of transmitting infections, such as malaria. The donated blood is tested for signs of infectious diseases, such as HIV and hepatitis. The blood is then tested to be sure it is compatible with you in order to minimize the chance of a transfusion reaction. If you or a relative donates blood, this is often done in anticipation of surgery and is not appropriate for emergency situations. It takes many days to process the donated blood. RISKS AND COMPLICATIONS Although transfusion therapy is very safe and saves many lives, the main dangers of transfusion include:   Getting an infectious disease.  Developing a transfusion reaction. This is an allergic reaction to something in the blood you were given. Every precaution is taken to prevent this. The decision to have a blood transfusion has been considered carefully by your caregiver before blood is given. Blood is not given unless the benefits outweigh the risks. AFTER THE TRANSFUSION  Right after receiving a blood transfusion, you will usually feel much better and more energetic. This is especially true if your red blood cells have gotten low (anemic). The transfusion raises the level of the red blood cells which carry oxygen, and this usually causes an energy increase.  The nurse administering the transfusion will monitor you carefully for complications. HOME CARE INSTRUCTIONS  No special instructions are needed after a transfusion. You may find your energy is better. Speak with your caregiver about any limitations on activity for underlying diseases you may have. SEEK MEDICAL CARE IF:   Your condition is not improving after your transfusion.  You develop  redness or irritation at the intravenous (IV) site. SEEK IMMEDIATE MEDICAL CARE IF:  Any of the following symptoms occur over the next 12 hours:  Shaking chills.  You have a temperature by mouth above 102 F (38.9 C), not controlled by medicine.  Chest, back, or muscle pain.  People around you feel you are not acting correctly or are confused.  Shortness of breath or difficulty breathing.  Dizziness and fainting.  You get a rash or develop hives.  You have a decrease in urine output.  Your urine turns a dark color or changes to pink, red, or brown. Any of the following symptoms occur over the next 10 days:  You have a temperature by mouth above 102 F (38.9 C), not controlled by medicine.  Shortness of breath.  Weakness after normal activity.  The white part of the eye turns yellow (jaundice).  You have a decrease in the amount of urine or are urinating less often.  Your urine turns a dark color or changes to pink, red, or brown. Document Released: 05/27/2000 Document Revised: 08/22/2011 Document Reviewed: 01/14/2008 [facility] Patient Information 2014 [place], [place].  _______________________________________________________________________

## 2019-12-10 ENCOUNTER — Other Ambulatory Visit: Payer: Self-pay

## 2019-12-10 ENCOUNTER — Encounter (HOSPITAL_COMMUNITY)
Admission: RE | Admit: 2019-12-10 | Discharge: 2019-12-10 | Disposition: A | Discharge status: Home / Self Care | Payer: PPO | Source: Ambulatory Visit | Attending: Orthopedic Surgery | Admitting: Orthopedic Surgery

## 2019-12-10 ENCOUNTER — Encounter (HOSPITAL_COMMUNITY): Payer: Self-pay

## 2019-12-10 DIAGNOSIS — Z01812 Encounter for preprocedural laboratory examination: Secondary | ICD-10-CM | POA: Insufficient documentation

## 2019-12-10 HISTORY — DX: Polyneuropathy, unspecified: G62.9

## 2019-12-10 HISTORY — DX: Deficiency of other specified B group vitamins: E53.8

## 2019-12-10 HISTORY — DX: Other specified postprocedural states: R11.2

## 2019-12-10 HISTORY — DX: Other specified postprocedural states: Z98.890

## 2019-12-10 LAB — SURGICAL PCR SCREEN
MRSA, PCR: NEGATIVE
Staphylococcus aureus: NEGATIVE

## 2019-12-11 ENCOUNTER — Encounter (HOSPITAL_COMMUNITY)
Admission: RE | Admit: 2019-12-11 | Discharge: 2019-12-11 | Disposition: A | Discharge status: Home / Self Care | Payer: PPO | Source: Ambulatory Visit | Attending: Orthopedic Surgery | Admitting: Orthopedic Surgery

## 2019-12-11 DIAGNOSIS — I10 Essential (primary) hypertension: Secondary | ICD-10-CM | POA: Diagnosis not present

## 2019-12-11 DIAGNOSIS — Z87891 Personal history of nicotine dependence: Secondary | ICD-10-CM | POA: Diagnosis not present

## 2019-12-11 DIAGNOSIS — R270 Ataxia, unspecified: Secondary | ICD-10-CM | POA: Diagnosis not present

## 2019-12-11 DIAGNOSIS — J441 Chronic obstructive pulmonary disease with (acute) exacerbation: Secondary | ICD-10-CM | POA: Diagnosis not present

## 2019-12-11 DIAGNOSIS — R7301 Impaired fasting glucose: Secondary | ICD-10-CM | POA: Diagnosis not present

## 2019-12-11 DIAGNOSIS — E782 Mixed hyperlipidemia: Secondary | ICD-10-CM | POA: Diagnosis not present

## 2019-12-11 DIAGNOSIS — E7849 Other hyperlipidemia: Secondary | ICD-10-CM | POA: Diagnosis not present

## 2019-12-11 DIAGNOSIS — I129 Hypertensive chronic kidney disease with stage 1 through stage 4 chronic kidney disease, or unspecified chronic kidney disease: Secondary | ICD-10-CM | POA: Diagnosis not present

## 2019-12-11 DIAGNOSIS — I700 Atherosclerosis of aorta: Secondary | ICD-10-CM | POA: Diagnosis not present

## 2019-12-11 DIAGNOSIS — Z01812 Encounter for preprocedural laboratory examination: Secondary | ICD-10-CM | POA: Diagnosis not present

## 2019-12-11 DIAGNOSIS — N183 Chronic kidney disease, stage 3 unspecified: Secondary | ICD-10-CM | POA: Diagnosis not present

## 2019-12-11 DIAGNOSIS — I25111 Atherosclerotic heart disease of native coronary artery with angina pectoris with documented spasm: Secondary | ICD-10-CM | POA: Diagnosis not present

## 2019-12-11 DIAGNOSIS — J9611 Chronic respiratory failure with hypoxia: Secondary | ICD-10-CM | POA: Diagnosis not present

## 2019-12-11 LAB — TYPE AND SCREEN
ABO/RH(D): O POS
Antibody Screen: NEGATIVE

## 2019-12-11 LAB — COMPREHENSIVE METABOLIC PANEL
ALT: 13 U/L (ref 0–44)
AST: 16 U/L (ref 15–41)
Albumin: 4 g/dL (ref 3.5–5.0)
Alkaline Phosphatase: 101 U/L (ref 38–126)
Anion gap: 8 (ref 5–15)
BUN: 16 mg/dL (ref 8–23)
CO2: 27 mmol/L (ref 22–32)
Calcium: 9.6 mg/dL (ref 8.9–10.3)
Chloride: 105 mmol/L (ref 98–111)
Creatinine, Ser: 0.9 mg/dL (ref 0.44–1.00)
GFR calc Af Amer: >60 mL/min (ref >60)
GFR calc non Af Amer: >60 mL/min (ref >60)
Glucose, Bld: 149 mg/dL — ABNORMAL HIGH (ref 70–99)
Potassium: 4.7 mmol/L (ref 3.5–5.1)
Sodium: 140 mmol/L (ref 135–145)
Total Bilirubin: 0.4 mg/dL (ref 0.3–1.2)
Total Protein: 7.1 g/dL (ref 6.5–8.1)

## 2019-12-11 LAB — CBC
HCT: 44.2 % (ref 36.0–46.0)
Hemoglobin: 13.3 g/dL (ref 12.0–15.0)
MCH: 28.1 pg (ref 26.0–34.0)
MCHC: 30.1 g/dL (ref 30.0–36.0)
MCV: 93.4 fL (ref 80.0–100.0)
Platelets: 164 10*3/uL (ref 150–400)
RBC: 4.73 MIL/uL (ref 3.87–5.11)
RDW: 17.1 % — ABNORMAL HIGH (ref 11.5–15.5)
WBC: 6.4 10*3/uL (ref 4.0–10.5)
nRBC: 0 % (ref 0.0–0.2)

## 2019-12-11 LAB — APTT: aPTT: 28 seconds (ref 24–36)

## 2019-12-11 LAB — PROTIME-INR
INR: 1 (ref 0.8–1.2)
Prothrombin Time: 13.1 seconds (ref 11.4–15.2)

## 2019-12-12 NOTE — Progress Notes (Signed)
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 HR 57. RR 16. SpO2 94%. T 36.6   PROVIDERS: - PCP is Caryl Bis, MD  - Cardiologist is Sherren Mocha, MD. Last office visit 09/06/19. Pt cleared for surgery at acceptable risk by Coletta Memos, NP.    LABS: Labs reviewed: Acceptable for surgery. (all labs ordered are listed, but only abnormal results are displayed)  Labs Reviewed  CBC - Abnormal; Notable for the following components:      Result Value   RDW 17.1 (*)    All other components within normal limits  COMPREHENSIVE METABOLIC PANEL - Abnormal; Notable for the following components:   Glucose, Bld 149 (*)    All other components within normal limits  APTT  PROTIME-INR  TYPE AND SCREEN     IMAGES: CT angio chest 05/31/16:  - Study is negative for pulmonary emboli. - Aortic atherosclerosis with left main and 3 vessel coronary artery disease. Referral for office based assessment of cardiac risk factors may be considered if not already performed. - Right-sided thyroid nodule measures 2.9 cm, poorly characterized by CT. If warranted, referral for ultrasound evaluation may be considered. - Small hiatal hernia   EKG 03/07/19: sinus bradycardia    CV: Carotid duplex 03/19/19:  - Right Carotid: Velocities in the right ICA are consistent with a 1-39%  stenosis. Non-hemodynamically significant plaque <50% noted in the  CCA. Incidental right thyroid nodule seen. See above for  measurements.  - Left  Carotid: Velocities in the left ICA are consistent with a 1-39% stenosis. Non-hemodynamically significant plaque <50% noted in the CCA. The ECA appears >50% stenosed. The velocity measurements in the proximal CCA and subclavian arteries aren't reproducible from prior exam.  - Vertebrals: Bilateral vertebral arteries demonstrate antegrade flow.  - Subclavians: Normal flow hemodynamics were seen in bilateral subclavian arteries.    Nuclear stress test 03/12/19:   Nuclear stress EF: 65%.  There was no ST segment deviation noted during stress.  No T wave inversion was noted during stress.  The study is normal.  This is a low risk study.  The left ventricular ejection fraction is normal (55-65%).   Echo 03/12/19:  1. Left ventricular ejection fraction, by visual estimation, is 60 to 65%. The left ventricle has normal function. Normal left ventricular size. There is no left ventricular hypertrophy.  2. Left ventricular diastolic Doppler parameters are indeterminate pattern of LV diastolic filling.  3. Global right ventricle has normal systolic function.The right ventricular size is mildly enlarged. No increase in right ventricular wall thickness.  4. Left atrial size was moderately dilated.  5. Right atrial size was normal.  6. The mitral valve is normal in structure. Mild mitral valve regurgitation. No evidence of mitral stenosis.  7. The tricuspid valve is normal in structure. Tricuspid valve regurgitation is mild.  8. The aortic valve is normal in structure. Aortic valve regurgitation was not visualized by color flow Doppler. Structurally  normal aortic valve, with no evidence of sclerosis or stenosis.  9. The pulmonic valve was normal in structure. Pulmonic valve regurgitation is trivial by color flow Doppler.  10. Normal pulmonary artery systolic pressure.  11. The inferior vena cava is normal in size with greater than 50% respiratory variability, suggesting right atrial pressure of 3  mmHg.  12. There is right bowing of the interatrial septum, suggestive of elevated left atrial pressure.   R/L cardiac cath 04/10/17:   Widely patent stent in the mid RCA.  60% ostial stenosis in the continuation of the RCA beyond the PDA.  Patent LAD stent with distal third 60-70% in-stent restenosis.  90% first septal perforator.  30-40% mid circumflex  Low normal left ventricular systolic function with estimated EF 50%.  Normal LV hemodynamics.  Normal right heart pressures with mean pulmonary capillary wedge pressure of 10 mmHg. RECOMMENDATIONS:  No high-grade coronary obstruction or abnormal hemodynamics to explain the severity of dyspnea.  The distal margin of the LAD stent is bordering on significant.  Needs to be followed over time for progression.   Past Medical History:  Diagnosis Date  . Achilles tendon rupture, right, initial encounter   . Allergic rhinitis, cause unspecified   . Anemia, unspecified   . Anxiety state, unspecified   . Atherosclerosis of aorta ([place])    TEE, June, 2010, grade 4 aortic arch atherosclerosis , Dr. Aundra Dubin  . CAD (coronary artery disease)    DES and circumflex 2006 /  DES to LAD 2011  . Carotid artery disease ([place])    Doppler, November, 2011, stable, 0-39% bilateral, turbulent flow left subclavian  . Chronic diastolic heart failure ([place])   . Cyst of thyroid   . Diverticulosis of colon (without mention of hemorrhage)   . Dysphasia    Possibly esophageal stricture  . Ejection fraction     EF 60%, Echo, November 12, 2008, /  EF 55%, TEE, November 17, 2008  . Esophageal reflux   . Hiatal hernia   . Hyperlipemia   . Hypertension   . Lumbago   . Lumbar disc disease   . MVP (mitral valve prolapse)   . Neuropathy   . Nonspecific abnormal finding in stool contents   . Obesity, unspecified   . Osteoarthrosis, unspecified whether generalized or localized, unspecified site   . Osteopenia   . Other diseases of lung, not elsewhere classified   .  Palpitations    October, 2012  . Peripheral vascular disease, unspecified ([place])   . Personal history of colonic polyps    ADENOMATOUS POLYP  . PFO (patent foramen ovale)    Patient had a very small PFO by TEE 2010.  This was seen only by bubble analysis during Valsalva  . PONV (postoperative nausea and vomiting)   . Shortness of breath   . Sleep apnea    uses CPAP nightly  . Sleep related hypoventilation/hypoxemia in conditions classifiable elsewhere   . Stroke [facility]) 11/2008   Treated with TPA? /     2010, TEE no left atrial clot, probably from atherosclerosis of the aortic arch  . 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 Victorino Dike.  Marland Kitchen Unspecified cerebral artery occlusion with cerebral infarction   . Unspecified venous (peripheral) insufficiency   . Vitamin B 12 deficiency     Past Surgical History:  Procedure Laterality Date  . ACHILLES TENDON SURGERY Right 04/05/2018   Procedure: Right achilles tendon reconstruction;  Surgeon: Wylene Simmer, MD;  Location: [place];  Service: Orthopedics;  Laterality: Right;  68min  . BACK SURGERY    . cataracts     both eyes  . CHOLECYSTECTOMY    . CORONARY ANGIOPLASTY WITH STENT PLACEMENT  1990's  . GASTROCNEMIUS RECESSION Right 04/05/2018   Procedure: Gastroc recession;  Surgeon: Wylene Simmer, MD;  Location: [place];  Service: Orthopedics;  Laterality: Right;  . LAPAROSCOPIC HYSTERECTOMY    . LUMBAR DISC SURGERY     X 3  . RIGHT/LEFT HEART CATH AND CORONARY ANGIOGRAPHY N/A 04/10/2017   Procedure: RIGHT/LEFT HEART CATH AND CORONARY ANGIOGRAPHY;  Surgeon: Belva Crome, MD;  Location: [place] CV LAB;  Service: Cardiovascular;  Laterality: N/A;  . TOTAL KNEE ARTHROPLASTY Right 01/14/2019   Procedure: TOTAL KNEE ARTHROPLASTY;  Surgeon: Gaynelle Arabian, MD;  [facility];  Service: Orthopedics;  Laterality: Right;  50min  . urethral suspension  1993   Dr. Nori Riis  . VARICOSE VEIN SURGERY      x 2    MEDICATIONS: . amLODipine (NORVASC) 5 MG tablet  . Apoaequorin (PREVAGEN) 10 MG CAPS  . aspirin EC 81 MG tablet  . buPROPion (WELLBUTRIN XL) 150 MG 24 hr tablet  . docusate sodium (COLACE) 100 MG capsule  . furosemide (LASIX) 40 MG tablet  . gabapentin (NEURONTIN) 300 MG capsule  . isosorbide mononitrate (IMDUR) 30 MG 24 hr tablet  . metoprolol tartrate (LOPRESSOR) 50 MG tablet  . nitroGLYCERIN (NITROSTAT) 0.4 MG SL tablet  . pantoprazole (PROTONIX) 40 MG tablet  . polyethylene glycol powder (GLYCOLAX/MIRALAX) powder  . polyvinyl alcohol (ARTIFICIAL TEARS) 1.4 % ophthalmic solution  . rosuvastatin (CRESTOR) 10 MG tablet   No current facility-administered medications for this encounter.    If no changes, I anticipate pt can proceed with surgery as scheduled.   Willeen Cass, FNP-BC [facility] Short Stay Surgical Center/Anesthesiology Phone: [phone] 12/12/2019 1:23 PM

## 2019-12-12 NOTE — Anesthesia Preprocedure Evaluation (Addendum)
Anesthesia Evaluation  Patient identified by MRN, date of birth, ID band Patient awake    Reviewed: Allergy & Precautions, NPO status , Patient's Chart, lab work & pertinent test results, reviewed documented beta blocker date and time   History of Anesthesia Complications Negative for: history of anesthetic complications  Airway Mallampati: II  TM Distance: >3 FB Neck ROM: Full    Dental no notable dental hx. (+) Teeth Intact, Dental Advisory Given, Missing,    Pulmonary shortness of breath and with exertion, sleep apnea and Continuous Positive Airway Pressure Ventilation , former smoker,    Pulmonary exam normal breath sounds clear to auscultation       Cardiovascular hypertension, Pt. on medications and Pt. on home beta blockers + CAD, + Cardiac Stents, + Peripheral Vascular Disease and +CHF  Normal cardiovascular exam+ Valvular Problems/Murmurs (mild MR, mild TR) MR  Rhythm:Regular Rate:Normal  DES to RCA 2006, DES to LAD 2009  Small PFO noted on echo in 2010, not reported on most recent echo (2020)  Echo 02/2019: 1. Left ventricular ejection fraction, by visual estimation, is 60 to  65%. The left ventricle has normal function. Normal left ventricular size.  There is no left ventricular hypertrophy.  2. Left ventricular diastolic Doppler parameters are indeterminate  pattern of LV diastolic filling.  3. Global right ventricle has normal systolic function.The right  ventricular size is mildly enlarged. No increase in right ventricular wall  thickness.  4. Left atrial size was moderately dilated.  5. Right atrial size was normal.  6. The mitral valve is normal in structure. Mild mitral valve  regurgitation. No evidence of mitral stenosis.  7. The tricuspid valve is normal in structure. Tricuspid valve  regurgitation is mild.  8. The aortic valve is normal in structure. Aortic valve regurgitation  was not visualized  by color flow Doppler. Structurally normal aortic  valve, with no evidence of sclerosis or stenosis.  9. The pulmonic valve was normal in structure. Pulmonic valve  regurgitation is trivial by color flow Doppler.  10. Normal pulmonary artery systolic pressure.  11. The inferior vena cava is normal in size with greater than 50%  respiratory variability, suggesting right atrial pressure of 3 mmHg.  12. There is right bowing of the interatrial septum, suggestive of  elevated left atrial pressure.   Stress test 2020: neg  R/L cardiac cath 04/10/17:   Widely patent stent in the mid RCA.60% ostial stenosis in the continuation of the RCA beyond the PDA.  Patent LAD stent with distal third 60-70% in-stent restenosis. 90% first septal perforator.  30-40% mid circumflex  Low normal left ventricular systolic function with estimated EF 50%. Normal LV hemodynamics.  Normal right heart pressures with mean pulmonary capillary wedge pressure of 10 mmHg. RECOMMENDATIONS:  No high-grade coronary obstruction or abnormal hemodynamics to explain the severity of dyspnea.  The distal margin of the LAD stent is bordering on significant. Needs to be followed over time for progression.   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 (s/p L CCA to SCA bypass date unknown, documented in hx on cath from 2006) CVA, No Residual Symptoms    GI/Hepatic Neg liver ROS, hiatal hernia, GERD  Medicated and Controlled,  Endo/Other  Obesity BMI 31  Renal/GU negative Renal ROS  negative genitourinary   Musculoskeletal  (+) Arthritis , Osteoarthritis,    Abdominal Normal abdominal exam  (+)   Peds  Hematology negative hematology  ROS (+) hct 44.2, plt 164   Anesthesia Other Findings Multiple back surgeries in the past (last one 2015) but successful spinal for last knee surgery (01/2019)  Reproductive/Obstetrics negative OB  ROS                           Anesthesia Physical Anesthesia Plan  ASA: III  Anesthesia Plan: Spinal, Regional and MAC   Post-op Pain Management:  Regional for Post-op pain   Induction:   PONV Risk Score and Plan: 2 and Propofol infusion and TIVA  Airway Management Planned: Natural Airway and Nasal Cannula  Additional Equipment: None  Intra-op Plan:   Post-operative Plan:   Informed Consent: I have reviewed the patients History and Physical, chart, labs and discussed the procedure including the risks, benefits and alternatives for the proposed anesthesia with the patient or authorized representative who has indicated his/her understanding and acceptance.       Plan Discussed with: CRNA  Anesthesia Plan Comments:       Anesthesia Quick Evaluation

## 2019-12-19 ENCOUNTER — Other Ambulatory Visit (HOSPITAL_COMMUNITY)
Admission: RE | Admit: 2019-12-19 | Discharge: 2019-12-19 | Disposition: A | Discharge status: Home / Self Care | Payer: PPO | Source: Ambulatory Visit | Attending: Orthopedic Surgery | Admitting: Orthopedic Surgery

## 2019-12-19 DIAGNOSIS — Z20822 Contact with and (suspected) exposure to covid-19: Secondary | ICD-10-CM | POA: Diagnosis not present

## 2019-12-19 DIAGNOSIS — Z01812 Encounter for preprocedural laboratory examination: Secondary | ICD-10-CM | POA: Diagnosis not present

## 2019-12-19 LAB — SARS CORONAVIRUS 2 (TAT 6-24 HRS): SARS Coronavirus 2: NEGATIVE

## 2019-12-20 NOTE — H&P (Signed)
TOTAL KNEE ADMISSION H&P  Patient is being admitted for left total knee arthroplasty.  Subjective:  Chief Complaint:left knee pain.  HPI: Stephanie Alvarado, 79 y.o. female, has a history of pain and functional disability in the left knee due to arthritis and has failed non-surgical conservative treatments for greater than 12 weeks to includecorticosteriod injections and activity modification.  Onset of symptoms was gradual, starting 3 years ago with gradually worsening course since that time. The patient noted no past surgery on the left knee(s).  Patient currently rates pain in the left knee(s) at 7 out of 10 with activity. Patient has worsening of pain with activity and weight bearing and pain that interferes with activities of daily living.  Patient has evidence of joint space narrowing by imaging studies.There is no active infection.  Patient Active Problem List   Diagnosis Date Noted  . Diarrhea 09/06/2017  . RLQ abdominal pain 05/26/2017  . Lightheadedness   . Ataxia 05/30/2016  . Dizziness 05/30/2016  . Preoperative clearance 11/10/2015  . Varicose veins of left lower extremity with complications 66/11/3014  . Varicose veins of leg with complications 06/21/3233  . Lumbar scoliosis 11/21/2013  . Preop cardiovascular exam 10/29/2013  . LLQ abdominal pain 04/05/2013  . Diverticulitis 04/05/2013  . Palpitations   . Subclavian artery disease ([place])   . PFO (patent foramen ovale)   . CAD (coronary artery disease)   . Ejection fraction   . Atherosclerosis of aorta ([place])   . Carotid artery disease ([place])   . 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 CONSTIPATION 06/11/2010  . ABDOMINAL PAIN-LLQ 06/11/2010  . History of colonic polyps 06/11/2010  . SLEEP RELATED HYPOVENTILATION/HYPOXEMIA CCE 07/17/2009  . Stroke ([place]) 11/11/2008  . COLONIC POLYPS 08/31/2008  . THYROID CYST 08/31/2008  . VENOUS INSUFFICIENCY 08/31/2008  . DIVERTICULOSIS OF COLON 08/31/2008  . LOW BACK PAIN, CHRONIC  08/31/2008  . 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 Dyspnea 08/08/2008  . HYPERCHOLESTEROLEMIA 04/19/2007  . OBESITY 04/19/2007  . ANEMIA 04/19/2007  . ANXIETY 04/19/2007  . Essential hypertension 04/19/2007  . ALLERGIC RHINITIS 04/19/2007  . PULMONARY NODULE, RIGHT LOWER LOBE 04/19/2007  . GERD 04/19/2007  . OA (osteoarthritis) of knee 04/19/2007   Past Medical History:  Diagnosis Date  . Achilles tendon rupture, right, initial encounter   . Allergic rhinitis, cause unspecified   . Anemia, unspecified   . Anxiety state, unspecified   . Atherosclerosis of aorta ([place])    TEE, June, 2010, grade 4 aortic arch atherosclerosis , Dr. Aundra Dubin  . CAD (coronary artery disease)    DES and circumflex 2006 /  DES to LAD 2011  . Carotid artery disease ([place])    Doppler, November, 2011, stable, 0-39% bilateral, turbulent flow left subclavian  . Chronic diastolic heart failure ([place])   . Cyst of thyroid   . Diverticulosis of colon (without mention of hemorrhage)   . Dysphasia    Possibly esophageal stricture  . Ejection fraction     EF 60%, Echo, November 12, 2008, /  EF 55%, TEE, November 17, 2008  . Esophageal reflux   . Hiatal hernia   . Hyperlipemia   . Hypertension   . Lumbago   . Lumbar disc disease   . MVP (mitral valve prolapse)   . Neuropathy   . Nonspecific abnormal finding in stool contents   . Obesity, unspecified   . Osteoarthrosis, unspecified whether generalized or localized, unspecified site   . Osteopenia   . Other diseases of lung, not  elsewhere classified   . Palpitations    October, 2012  . Peripheral vascular disease, unspecified ([place])   . Personal history of colonic polyps    ADENOMATOUS POLYP  . PFO (patent foramen ovale)    Patient had a very small PFO by TEE 2010.  This was seen only by bubble analysis during Valsalva  . PONV (postoperative nausea and vomiting)   . Shortness of breath   . Sleep apnea    uses CPAP nightly  . Sleep related hypoventilation/hypoxemia in  conditions classifiable elsewhere   . Stroke [facility]) 11/2008   Treated with TPA? /     2010, TEE no left atrial clot, probably from atherosclerosis of the aortic arch  . Subclavian artery disease [facility])    Remote surgery by Dr. Victorino Dike.  Marland Kitchen Unspecified cerebral artery occlusion with cerebral infarction   . Unspecified venous (peripheral) insufficiency   . Vitamin B 12 deficiency     Past Surgical History:  Procedure Laterality Date  . ACHILLES TENDON SURGERY Right 04/05/2018   Procedure: Right achilles tendon reconstruction;  Surgeon: Wylene Simmer, MD;  Location: [place];  Service: Orthopedics;  Laterality: Right;  73min  . BACK SURGERY    . cataracts     both eyes  . CHOLECYSTECTOMY    . CORONARY ANGIOPLASTY WITH STENT PLACEMENT  1990's  . GASTROCNEMIUS RECESSION Right 04/05/2018   Procedure: Gastroc recession;  Surgeon: Wylene Simmer, MD;  Location: [place];  Service: Orthopedics;  Laterality: Right;  . LAPAROSCOPIC HYSTERECTOMY    . LUMBAR DISC SURGERY     X 3  . RIGHT/LEFT HEART CATH AND CORONARY ANGIOGRAPHY N/A 04/10/2017   Procedure: RIGHT/LEFT HEART CATH AND CORONARY ANGIOGRAPHY;  Surgeon: Belva Crome, MD;  Location: [place] [facility];  Service: Cardiovascular;  Laterality: N/A;  . TOTAL KNEE ARTHROPLASTY Right 01/14/2019   Procedure: TOTAL KNEE ARTHROPLASTY;  Surgeon: Gaynelle Arabian, MD;  Location: [facility];  Service: Orthopedics;  Laterality: Right;  58min  . urethral suspension  1993   Dr. Nori Riis  . VARICOSE VEIN SURGERY     x 2    No current facility-administered medications for this encounter.   Current Outpatient Medications  Medication Sig Dispense Refill Last Dose  . amLODipine (NORVASC) 5 MG tablet Take 5 mg by mouth every morning.     Marland Kitchen Apoaequorin (PREVAGEN) 10 MG CAPS Take 10 mg by mouth daily.     Marland Kitchen aspirin EC 81 MG tablet Take 81 mg by mouth daily.     Marland Kitchen buPROPion (WELLBUTRIN XL) 150 MG 24 hr tablet Take 150 mg by mouth in the  morning and at bedtime.      . docusate sodium (COLACE) 100 MG capsule Take 100 mg by mouth daily as needed for mild constipation.     . furosemide (LASIX) 40 MG tablet Take 40 mg by mouth daily.      Marland Kitchen gabapentin (NEURONTIN) 300 MG capsule Take 600 mg by mouth 3 (three) times daily.      . isosorbide mononitrate (IMDUR) 30 MG 24 hr tablet Take 30 mg by mouth at bedtime.      . metoprolol tartrate (LOPRESSOR) 50 MG tablet Take 50 mg by mouth 2 (two) times daily.      . nitroGLYCERIN (NITROSTAT) 0.4 MG SL tablet Place 0.4 mg under the tongue every 5 (five) minutes as needed for chest pain (TAKE UP TO 3 DOSES BEFORE CALLING 911).     Marland Kitchen  pantoprazole (PROTONIX) 40 MG tablet Take 40 mg by mouth 2 (two) times daily.      . polyethylene glycol powder (GLYCOLAX/MIRALAX) powder Take 17 g by mouth daily as needed for moderate constipation.      . polyvinyl alcohol (ARTIFICIAL TEARS) 1.4 % ophthalmic solution Place 1 drop into both eyes as needed for dry eyes.     . rosuvastatin (CRESTOR) 10 MG tablet Take 10 mg by mouth at bedtime.      Allergies  Allergen Reactions  . Codeine Itching    REACTION: itch    Social History   Tobacco Use  . Smoking status: Former Smoker    Packs/day: 1.00    Years: 40.00    Pack years: 40.00    Types: Cigarettes    Quit date: 06/13/1998    Years since quitting: 21.5  . Smokeless tobacco: Never Used  Substance Use Topics  . Alcohol use: No    Alcohol/week: 0.0 standard drinks    Family History  Problem Relation Age of Onset  . Heart disease Father   . Heart attack Father   . Alzheimer's disease Mother   . Colon cancer Paternal Aunt   . Breast cancer Other        cousions  . Esophageal cancer Neg Hx   . Rectal cancer Neg Hx   . Stomach cancer Neg Hx      Review of Systems  Constitutional: Negative for chills and fatigue.  Respiratory: Negative for cough and shortness of breath.   Cardiovascular: Negative for chest pain.  Musculoskeletal: Positive for  arthralgias.    Objective:  Physical Exam Patient is an 79 year old female.  Well nourished and well developed. General: Alert and oriented x3, cooperative and pleasant, no acute distress. Head: normocephalic, atraumatic, neck supple. Eyes: EOMI. Respiratory: breath sounds clear in all fields, no wheezing, rales, or rhonchi. Cardiovascular: Regular rate and rhythm, no murmurs, gallops or rubs. Abdomen: non-tender to palpation and soft, normoactive bowel sounds.  Musculoskeletal: Left Knee Exam: No effusion present. No swelling present. The range of motion is: 5 to 125 degrees. Marked crepitus on range of motion of the knee. Significant medial joint line tenderness. Slight lateral joint line tenderness. The knee is stable.  Calves soft and nontender. Motor function intact in LE. Strength 5/5 LE bilaterally. Neuro: Distal pulses 2+. Sensation to light touch intact in LE.  Vital signs in last 24 hours:    Labs:   Estimated body mass index is 31.05 kg/m as calculated from the following:   Height as of 12/10/19: 5' 7.5" (1.715 m).   Weight as of 12/10/19: 91.3 kg.   Imaging Review Plain radiographs demonstrate severe degenerative joint disease of the left knee(s). The overall alignment isneutral. The bone quality appears to be adequate for age and reported activity level.  Assessment/Plan:  End stage arthritis, left knee   The patient history, physical examination, clinical judgment of the provider and imaging studies are consistent with end stage degenerative joint disease of the left knee(s) and total knee arthroplasty is deemed medically necessary. The treatment options including medical management, injection therapy arthroscopy and arthroplasty were discussed at length. The risks and benefits of total knee arthroplasty were presented and reviewed. The risks due to aseptic loosening, infection, stiffness, patella tracking problems, thromboembolic complications and other  imponderables were discussed. The patient acknowledged the explanation, agreed to proceed with the plan and consent was signed. Patient is being admitted for inpatient treatment for surgery, pain control, PT, OT,  prophylactic antibiotics, VTE prophylaxis, progressive ambulation and ADL's and discharge planning. The patient is planning to be discharged home.  Therapy Plans: HHPT vs SNF (daugther unable to stay with her, no one at home, multiple falls recently) Disposition: Lives alone, daughter unable to stay Planned DVT Prophylaxis: Xarelto 10mg  daily DME needed: none PCP: Dr. Gar Ponto, clearance received Cardiologist: Dr. Sherren Mocha, clearance received TXA: IV Allergies: codeine - itching Anesthesia Concerns: none BMI: 31.5 Not diabetic.  Other: Oxycodone with benadryl last TKA. Hx of stroke in 2010, not on anticoagulants.  Patient's anticipated LOS is less than 2 midnights, meeting these requirements: - Lives within 1 hour of care - Has a competent adult at home to recover with post-op recover - NO history of  - Chronic pain requiring opiods  - Diabetes  - Coronary Artery Disease  - Heart failure  - Heart attack  - Stroke  - DVT/VTE  - Cardiac arrhythmia  - Respiratory Failure/COPD  - Renal failure  - Anemia  - Advanced Liver disease  Griffith Citron, PA-C Orthopedic Surgery [facility] [place] [phone]

## 2019-12-22 MED ORDER — BUPIVACAINE LIPOSOME 1.3 % IJ SUSP
20.0000 mL | Freq: Once | INTRAMUSCULAR | Status: DC
  Filled 2019-12-22: qty 20

## 2019-12-23 ENCOUNTER — Other Ambulatory Visit: Payer: Self-pay

## 2019-12-23 ENCOUNTER — Inpatient Hospital Stay (HOSPITAL_COMMUNITY): Payer: PPO | Admitting: Anesthesiology

## 2019-12-23 ENCOUNTER — Encounter (HOSPITAL_COMMUNITY): Payer: Self-pay | Admitting: Orthopedic Surgery

## 2019-12-23 ENCOUNTER — Inpatient Hospital Stay (HOSPITAL_COMMUNITY)
Admission: RE | Admit: 2019-12-23 | Discharge: 2019-12-25 | DRG: 470 | Disposition: A | Discharge status: Home / Self Care | Payer: PPO | Attending: Orthopedic Surgery | Admitting: Orthopedic Surgery

## 2019-12-23 ENCOUNTER — Encounter (HOSPITAL_COMMUNITY)
Admission: RE | Disposition: A | Discharge status: Home / Self Care | Payer: Self-pay | Source: Home / Self Care | Attending: Orthopedic Surgery

## 2019-12-23 ENCOUNTER — Inpatient Hospital Stay (HOSPITAL_COMMUNITY): Payer: PPO | Admitting: Emergency Medicine

## 2019-12-23 DIAGNOSIS — I10 Essential (primary) hypertension: Secondary | ICD-10-CM | POA: Diagnosis not present

## 2019-12-23 DIAGNOSIS — F411 Generalized anxiety disorder: Secondary | ICD-10-CM | POA: Diagnosis not present

## 2019-12-23 DIAGNOSIS — I739 Peripheral vascular disease, unspecified: Secondary | ICD-10-CM | POA: Diagnosis not present

## 2019-12-23 DIAGNOSIS — K5730 Diverticulosis of large intestine without perforation or abscess without bleeding: Secondary | ICD-10-CM | POA: Diagnosis present

## 2019-12-23 DIAGNOSIS — Z96652 Presence of left artificial knee joint: Secondary | ICD-10-CM | POA: Diagnosis not present

## 2019-12-23 DIAGNOSIS — Z6831 Body mass index (BMI) 31.0-31.9, adult: Secondary | ICD-10-CM

## 2019-12-23 DIAGNOSIS — R609 Edema, unspecified: Secondary | ICD-10-CM | POA: Diagnosis not present

## 2019-12-23 DIAGNOSIS — M1712 Unilateral primary osteoarthritis, left knee: Principal | ICD-10-CM | POA: Diagnosis present

## 2019-12-23 DIAGNOSIS — M6281 Muscle weakness (generalized): Secondary | ICD-10-CM | POA: Diagnosis not present

## 2019-12-23 DIAGNOSIS — Q211 Atrial septal defect: Secondary | ICD-10-CM

## 2019-12-23 DIAGNOSIS — M8580 Other specified disorders of bone density and structure, unspecified site: Secondary | ICD-10-CM | POA: Diagnosis present

## 2019-12-23 DIAGNOSIS — Z4789 Encounter for other orthopedic aftercare: Secondary | ICD-10-CM | POA: Diagnosis not present

## 2019-12-23 DIAGNOSIS — I5032 Chronic diastolic (congestive) heart failure: Secondary | ICD-10-CM | POA: Diagnosis present

## 2019-12-23 DIAGNOSIS — M797 Fibromyalgia: Secondary | ICD-10-CM | POA: Diagnosis not present

## 2019-12-23 DIAGNOSIS — J309 Allergic rhinitis, unspecified: Secondary | ICD-10-CM | POA: Diagnosis not present

## 2019-12-23 DIAGNOSIS — F419 Anxiety disorder, unspecified: Secondary | ICD-10-CM | POA: Diagnosis not present

## 2019-12-23 DIAGNOSIS — Z471 Aftercare following joint replacement surgery: Secondary | ICD-10-CM | POA: Diagnosis not present

## 2019-12-23 DIAGNOSIS — R2681 Unsteadiness on feet: Secondary | ICD-10-CM | POA: Diagnosis not present

## 2019-12-23 DIAGNOSIS — E669 Obesity, unspecified: Secondary | ICD-10-CM | POA: Diagnosis present

## 2019-12-23 DIAGNOSIS — G4733 Obstructive sleep apnea (adult) (pediatric): Secondary | ICD-10-CM | POA: Diagnosis not present

## 2019-12-23 DIAGNOSIS — I872 Venous insufficiency (chronic) (peripheral): Secondary | ICD-10-CM | POA: Diagnosis present

## 2019-12-23 DIAGNOSIS — Z79899 Other long term (current) drug therapy: Secondary | ICD-10-CM

## 2019-12-23 DIAGNOSIS — I700 Atherosclerosis of aorta: Secondary | ICD-10-CM | POA: Diagnosis not present

## 2019-12-23 DIAGNOSIS — Z87891 Personal history of nicotine dependence: Secondary | ICD-10-CM

## 2019-12-23 DIAGNOSIS — I2510 Atherosclerotic heart disease of native coronary artery without angina pectoris: Secondary | ICD-10-CM | POA: Diagnosis present

## 2019-12-23 DIAGNOSIS — Z820 Family history of epilepsy and other diseases of the nervous system: Secondary | ICD-10-CM

## 2019-12-23 DIAGNOSIS — Z8673 Personal history of transient ischemic attack (TIA), and cerebral infarction without residual deficits: Secondary | ICD-10-CM | POA: Diagnosis not present

## 2019-12-23 DIAGNOSIS — G8918 Other acute postprocedural pain: Secondary | ICD-10-CM | POA: Diagnosis not present

## 2019-12-23 DIAGNOSIS — Z8249 Family history of ischemic heart disease and other diseases of the circulatory system: Secondary | ICD-10-CM

## 2019-12-23 DIAGNOSIS — Z9049 Acquired absence of other specified parts of digestive tract: Secondary | ICD-10-CM

## 2019-12-23 DIAGNOSIS — G4730 Sleep apnea, unspecified: Secondary | ICD-10-CM | POA: Diagnosis present

## 2019-12-23 DIAGNOSIS — E785 Hyperlipidemia, unspecified: Secondary | ICD-10-CM | POA: Diagnosis present

## 2019-12-23 DIAGNOSIS — M519 Unspecified thoracic, thoracolumbar and lumbosacral intervertebral disc disorder: Secondary | ICD-10-CM | POA: Diagnosis not present

## 2019-12-23 DIAGNOSIS — Z803 Family history of malignant neoplasm of breast: Secondary | ICD-10-CM

## 2019-12-23 DIAGNOSIS — Z9181 History of falling: Secondary | ICD-10-CM | POA: Diagnosis not present

## 2019-12-23 DIAGNOSIS — I110 Hypertensive heart disease with heart failure: Secondary | ICD-10-CM | POA: Diagnosis present

## 2019-12-23 DIAGNOSIS — K219 Gastro-esophageal reflux disease without esophagitis: Secondary | ICD-10-CM | POA: Diagnosis present

## 2019-12-23 DIAGNOSIS — G4734 Idiopathic sleep related nonobstructive alveolar hypoventilation: Secondary | ICD-10-CM | POA: Diagnosis not present

## 2019-12-23 DIAGNOSIS — Z8719 Personal history of other diseases of the digestive system: Secondary | ICD-10-CM

## 2019-12-23 DIAGNOSIS — I341 Nonrheumatic mitral (valve) prolapse: Secondary | ICD-10-CM | POA: Diagnosis present

## 2019-12-23 DIAGNOSIS — Z885 Allergy status to narcotic agent status: Secondary | ICD-10-CM

## 2019-12-23 DIAGNOSIS — E538 Deficiency of other specified B group vitamins: Secondary | ICD-10-CM | POA: Diagnosis not present

## 2019-12-23 DIAGNOSIS — Z20822 Contact with and (suspected) exposure to covid-19: Secondary | ICD-10-CM | POA: Diagnosis present

## 2019-12-23 DIAGNOSIS — Z96651 Presence of right artificial knee joint: Secondary | ICD-10-CM | POA: Diagnosis not present

## 2019-12-23 DIAGNOSIS — R2689 Other abnormalities of gait and mobility: Secondary | ICD-10-CM | POA: Diagnosis not present

## 2019-12-23 DIAGNOSIS — Z800 Family history of malignant neoplasm of digestive organs: Secondary | ICD-10-CM

## 2019-12-23 DIAGNOSIS — Z95818 Presence of other cardiac implants and grafts: Secondary | ICD-10-CM | POA: Diagnosis not present

## 2019-12-23 DIAGNOSIS — G629 Polyneuropathy, unspecified: Secondary | ICD-10-CM | POA: Diagnosis not present

## 2019-12-23 DIAGNOSIS — Z90710 Acquired absence of both cervix and uterus: Secondary | ICD-10-CM

## 2019-12-23 HISTORY — PX: TOTAL KNEE ARTHROPLASTY: SHX125

## 2019-12-23 SURGERY — ARTHROPLASTY, KNEE, TOTAL
Anesthesia: Monitor Anesthesia Care | Site: Knee | Laterality: Left

## 2019-12-23 MED ORDER — POVIDONE-IODINE 10 % EX SWAB
2.0000 "application " | Freq: Once | CUTANEOUS | Status: AC
  Administered 2019-12-23: 2 via TOPICAL

## 2019-12-23 MED ORDER — DIPHENHYDRAMINE HCL 50 MG/ML IJ SOLN
6.2500 mg | Freq: Once | INTRAMUSCULAR | Status: AC
  Administered 2019-12-23: 6.25 mg via INTRAVENOUS

## 2019-12-23 MED ORDER — POLYVINYL ALCOHOL 1.4 % OP SOLN
1.0000 [drp] | OPHTHALMIC | Status: DC | PRN
  Filled 2019-12-23: qty 15

## 2019-12-23 MED ORDER — HYDROMORPHONE HCL 1 MG/ML IJ SOLN
0.2500 mg | INTRAMUSCULAR | Status: DC | PRN
  Administered 2019-12-23 (×2): 0.5 mg via INTRAVENOUS

## 2019-12-23 MED ORDER — SODIUM CHLORIDE 0.9 % IV SOLN: INTRAVENOUS | Status: DC

## 2019-12-23 MED ORDER — METHOCARBAMOL 500 MG IVPB - SIMPLE MED
INTRAVENOUS | Status: AC
  Filled 2019-12-23: qty 50

## 2019-12-23 MED ORDER — STERILE WATER FOR INJECTION IJ SOLN
INTRAMUSCULAR | Status: AC
  Filled 2019-12-23: qty 10

## 2019-12-23 MED ORDER — MIDAZOLAM HCL 2 MG/2ML IJ SOLN
INTRAMUSCULAR | Status: AC
  Filled 2019-12-23: qty 2

## 2019-12-23 MED ORDER — CEFAZOLIN SODIUM-DEXTROSE 2-4 GM/100ML-% IV SOLN
2.0000 g | Freq: Four times a day (QID) | INTRAVENOUS | Status: AC
  Administered 2019-12-23 (×2): 2 g via INTRAVENOUS
  Filled 2019-12-23 (×2): qty 100

## 2019-12-23 MED ORDER — STERILE WATER FOR IRRIGATION IR SOLN
Status: DC | PRN
  Administered 2019-12-23: 2000 mL

## 2019-12-23 MED ORDER — ROPIVACAINE HCL 5 MG/ML IJ SOLN
INTRAMUSCULAR | Status: DC | PRN
Start: 2019-12-23 — End: 2019-12-23
  Administered 2019-12-23: 30 mL via PERINEURAL

## 2019-12-23 MED ORDER — RIVAROXABAN 10 MG PO TABS
10.0000 mg | ORAL_TABLET | Freq: Every day | ORAL | Status: DC
  Administered 2019-12-24 – 2019-12-25 (×2): 10 mg via ORAL
  Filled 2019-12-23 (×2): qty 1

## 2019-12-23 MED ORDER — DEXAMETHASONE SODIUM PHOSPHATE 10 MG/ML IJ SOLN
INTRAMUSCULAR | Status: DC | PRN
  Administered 2019-12-23: 10 mg

## 2019-12-23 MED ORDER — DEXAMETHASONE SODIUM PHOSPHATE 10 MG/ML IJ SOLN: 8.0000 mg | Freq: Once | INTRAMUSCULAR | Status: DC

## 2019-12-23 MED ORDER — NITROGLYCERIN 0.4 MG SL SUBL: 0.4000 mg | SUBLINGUAL_TABLET | SUBLINGUAL | Status: DC | PRN

## 2019-12-23 MED ORDER — SODIUM CHLORIDE (PF) 0.9 % IJ SOLN
INTRAMUSCULAR | Status: AC
  Filled 2019-12-23: qty 10

## 2019-12-23 MED ORDER — BUPROPION HCL ER (XL) 150 MG PO TB24
150.0000 mg | ORAL_TABLET | Freq: Two times a day (BID) | ORAL | Status: DC
  Administered 2019-12-23 – 2019-12-25 (×4): 150 mg via ORAL
  Filled 2019-12-23 (×4): qty 1

## 2019-12-23 MED ORDER — METOCLOPRAMIDE HCL 5 MG/ML IJ SOLN: 5.0000 mg | Freq: Three times a day (TID) | INTRAMUSCULAR | Status: DC | PRN

## 2019-12-23 MED ORDER — METHOCARBAMOL 500 MG PO TABS
500.0000 mg | ORAL_TABLET | Freq: Four times a day (QID) | ORAL | Status: DC | PRN
  Administered 2019-12-24 (×2): 500 mg via ORAL
  Filled 2019-12-23 (×2): qty 1

## 2019-12-23 MED ORDER — LACTATED RINGERS IV SOLN: INTRAVENOUS | Status: DC

## 2019-12-23 MED ORDER — POLYETHYLENE GLYCOL 3350 17 G PO PACK: 17.0000 g | PACK | Freq: Every day | ORAL | Status: DC | PRN

## 2019-12-23 MED ORDER — SODIUM CHLORIDE 0.9 % IR SOLN
Status: DC | PRN
  Administered 2019-12-23: 1000 mL

## 2019-12-23 MED ORDER — CHLORHEXIDINE GLUCONATE 0.12 % MT SOLN
15.0000 mL | Freq: Once | OROMUCOSAL | Status: AC
  Administered 2019-12-23: 15 mL via OROMUCOSAL

## 2019-12-23 MED ORDER — GABAPENTIN 300 MG PO CAPS
600.0000 mg | ORAL_CAPSULE | Freq: Three times a day (TID) | ORAL | Status: DC
  Administered 2019-12-23 – 2019-12-25 (×6): 600 mg via ORAL
  Filled 2019-12-23 (×6): qty 2

## 2019-12-23 MED ORDER — PROPOFOL 10 MG/ML IV BOLUS
INTRAVENOUS | Status: DC | PRN
  Administered 2019-12-23 (×2): 20 mg via INTRAVENOUS

## 2019-12-23 MED ORDER — BISACODYL 10 MG RE SUPP: 10.0000 mg | Freq: Every day | RECTAL | Status: DC | PRN

## 2019-12-23 MED ORDER — ONDANSETRON HCL 4 MG/2ML IJ SOLN
INTRAMUSCULAR | Status: AC
  Filled 2019-12-23: qty 8

## 2019-12-23 MED ORDER — ACETAMINOPHEN 10 MG/ML IV SOLN
1000.0000 mg | Freq: Four times a day (QID) | INTRAVENOUS | Status: DC
  Administered 2019-12-23: 1000 mg via INTRAVENOUS
  Filled 2019-12-23: qty 100

## 2019-12-23 MED ORDER — 0.9 % SODIUM CHLORIDE (POUR BTL) OPTIME: TOPICAL | Status: DC | PRN

## 2019-12-23 MED ORDER — ISOSORBIDE MONONITRATE ER 30 MG PO TB24
30.0000 mg | ORAL_TABLET | Freq: Every day | ORAL | Status: DC
  Administered 2019-12-23 – 2019-12-24 (×2): 30 mg via ORAL
  Filled 2019-12-23 (×2): qty 1

## 2019-12-23 MED ORDER — PROPOFOL 500 MG/50ML IV EMUL
INTRAVENOUS | Status: AC
  Filled 2019-12-23: qty 100

## 2019-12-23 MED ORDER — OXYCODONE HCL 5 MG/5ML PO SOLN: 5.0000 mg | Freq: Once | ORAL | Status: DC | PRN

## 2019-12-23 MED ORDER — PROPOFOL 1000 MG/100ML IV EMUL
INTRAVENOUS | Status: AC
  Filled 2019-12-23: qty 300

## 2019-12-23 MED ORDER — PHENYLEPHRINE HCL-NACL 10-0.9 MG/250ML-% IV SOLN
INTRAVENOUS | Status: DC | PRN
Start: 2019-12-23 — End: 2019-12-23
  Administered 2019-12-23: 25 ug/min via INTRAVENOUS

## 2019-12-23 MED ORDER — SODIUM CHLORIDE (PF) 0.9 % IJ SOLN
INTRAMUSCULAR | Status: AC
  Filled 2019-12-23: qty 50

## 2019-12-23 MED ORDER — OXYCODONE HCL 5 MG PO TABS: 5.0000 mg | ORAL_TABLET | Freq: Once | ORAL | Status: DC | PRN

## 2019-12-23 MED ORDER — DEXAMETHASONE SODIUM PHOSPHATE 10 MG/ML IJ SOLN
10.0000 mg | Freq: Once | INTRAMUSCULAR | Status: AC
  Administered 2019-12-24: 10 mg via INTRAVENOUS
  Filled 2019-12-23: qty 1

## 2019-12-23 MED ORDER — DOCUSATE SODIUM 100 MG PO CAPS
100.0000 mg | ORAL_CAPSULE | Freq: Two times a day (BID) | ORAL | Status: DC
  Administered 2019-12-23 – 2019-12-25 (×4): 100 mg via ORAL
  Filled 2019-12-23 (×4): qty 1

## 2019-12-23 MED ORDER — OXYCODONE HCL 5 MG PO TABS
5.0000 mg | ORAL_TABLET | ORAL | Status: DC | PRN
  Administered 2019-12-23 – 2019-12-24 (×2): 5 mg via ORAL
  Filled 2019-12-23 (×2): qty 1

## 2019-12-23 MED ORDER — ONDANSETRON HCL 4 MG/2ML IJ SOLN
INTRAMUSCULAR | Status: DC | PRN
  Administered 2019-12-23: 4 mg via INTRAVENOUS

## 2019-12-23 MED ORDER — BUPIVACAINE LIPOSOME 1.3 % IJ SUSP
INTRAMUSCULAR | Status: DC | PRN
  Administered 2019-12-23: 20 mL

## 2019-12-23 MED ORDER — ACETAMINOPHEN 500 MG PO TABS
1000.0000 mg | ORAL_TABLET | Freq: Four times a day (QID) | ORAL | Status: AC
  Administered 2019-12-23 – 2019-12-24 (×3): 1000 mg via ORAL
  Filled 2019-12-23 (×3): qty 2

## 2019-12-23 MED ORDER — MENTHOL 3 MG MT LOZG
1.0000 | LOZENGE | OROMUCOSAL | Status: DC | PRN
  Filled 2019-12-23: qty 9

## 2019-12-23 MED ORDER — DIPHENHYDRAMINE HCL 50 MG/ML IJ SOLN: 6.2500 mg | Freq: Four times a day (QID) | INTRAMUSCULAR | Status: DC | PRN

## 2019-12-23 MED ORDER — OXYCODONE HCL 5 MG PO TABS
10.0000 mg | ORAL_TABLET | ORAL | Status: DC | PRN
  Administered 2019-12-24 – 2019-12-25 (×3): 10 mg via ORAL
  Filled 2019-12-23 (×3): qty 2

## 2019-12-23 MED ORDER — FENTANYL CITRATE (PF) 100 MCG/2ML IJ SOLN
INTRAMUSCULAR | Status: AC
  Filled 2019-12-23: qty 2

## 2019-12-23 MED ORDER — ONDANSETRON HCL 4 MG PO TABS: 4.0000 mg | ORAL_TABLET | Freq: Four times a day (QID) | ORAL | Status: DC | PRN

## 2019-12-23 MED ORDER — LIDOCAINE 2% (20 MG/ML) 5 ML SYRINGE
INTRAMUSCULAR | Status: AC
  Filled 2019-12-23: qty 20

## 2019-12-23 MED ORDER — CEFAZOLIN SODIUM-DEXTROSE 2-4 GM/100ML-% IV SOLN
2.0000 g | INTRAVENOUS | Status: AC
  Administered 2019-12-23: 2 g via INTRAVENOUS
  Filled 2019-12-23: qty 100

## 2019-12-23 MED ORDER — PHENYLEPHRINE HCL (PRESSORS) 10 MG/ML IV SOLN
INTRAVENOUS | Status: AC
  Filled 2019-12-23: qty 4

## 2019-12-23 MED ORDER — METOCLOPRAMIDE HCL 5 MG PO TABS: 5.0000 mg | ORAL_TABLET | Freq: Three times a day (TID) | ORAL | Status: DC | PRN

## 2019-12-23 MED ORDER — MEPIVACAINE HCL (PF) 2 % IJ SOLN
INTRAMUSCULAR | Status: DC | PRN
  Administered 2019-12-23: 3 mL via INTRATHECAL

## 2019-12-23 MED ORDER — HYDROMORPHONE HCL 1 MG/ML IJ SOLN
INTRAMUSCULAR | Status: AC
  Filled 2019-12-23: qty 1

## 2019-12-23 MED ORDER — ROSUVASTATIN CALCIUM 10 MG PO TABS
10.0000 mg | ORAL_TABLET | Freq: Every day | ORAL | Status: DC
  Administered 2019-12-23 – 2019-12-24 (×2): 10 mg via ORAL
  Filled 2019-12-23 (×2): qty 1

## 2019-12-23 MED ORDER — MORPHINE SULFATE (PF) 2 MG/ML IV SOLN
0.5000 mg | INTRAVENOUS | Status: DC | PRN
  Administered 2019-12-23 – 2019-12-25 (×2): 1 mg via INTRAVENOUS
  Filled 2019-12-23 (×2): qty 1

## 2019-12-23 MED ORDER — ONDANSETRON HCL 4 MG/2ML IJ SOLN
4.0000 mg | Freq: Four times a day (QID) | INTRAMUSCULAR | Status: DC | PRN
  Administered 2019-12-23: 4 mg via INTRAVENOUS
  Filled 2019-12-23: qty 2

## 2019-12-23 MED ORDER — PROPOFOL 500 MG/50ML IV EMUL
INTRAVENOUS | Status: DC | PRN
  Administered 2019-12-23: 50 ug/kg/min via INTRAVENOUS

## 2019-12-23 MED ORDER — DIPHENHYDRAMINE HCL 50 MG/ML IJ SOLN
INTRAMUSCULAR | Status: AC
  Filled 2019-12-23: qty 1

## 2019-12-23 MED ORDER — TRANEXAMIC ACID-NACL 1000-0.7 MG/100ML-% IV SOLN
1000.0000 mg | INTRAVENOUS | Status: AC
  Administered 2019-12-23: 1000 mg via INTRAVENOUS
  Filled 2019-12-23: qty 100

## 2019-12-23 MED ORDER — DEXAMETHASONE SODIUM PHOSPHATE 10 MG/ML IJ SOLN
INTRAMUSCULAR | Status: DC | PRN
  Administered 2019-12-23: 8 mg via INTRAVENOUS

## 2019-12-23 MED ORDER — FENTANYL CITRATE (PF) 100 MCG/2ML IJ SOLN
25.0000 ug | INTRAMUSCULAR | Status: DC | PRN
  Administered 2019-12-23 (×2): 50 ug via INTRAVENOUS

## 2019-12-23 MED ORDER — AMLODIPINE BESYLATE 5 MG PO TABS
5.0000 mg | ORAL_TABLET | Freq: Every morning | ORAL | Status: DC
  Filled 2019-12-23: qty 1

## 2019-12-23 MED ORDER — METOPROLOL TARTRATE 50 MG PO TABS
50.0000 mg | ORAL_TABLET | Freq: Two times a day (BID) | ORAL | Status: DC
  Administered 2019-12-23 – 2019-12-25 (×4): 50 mg via ORAL
  Filled 2019-12-23 (×4): qty 1

## 2019-12-23 MED ORDER — FLEET ENEMA 7-19 GM/118ML RE ENEM: 1.0000 | ENEMA | Freq: Once | RECTAL | Status: DC | PRN

## 2019-12-23 MED ORDER — DIPHENHYDRAMINE HCL 12.5 MG/5ML PO ELIX
12.5000 mg | ORAL_SOLUTION | ORAL | Status: DC | PRN
  Administered 2019-12-23 – 2019-12-25 (×3): 12.5 mg via ORAL
  Filled 2019-12-23 (×3): qty 5

## 2019-12-23 MED ORDER — SODIUM CHLORIDE (PF) 0.9 % IJ SOLN
INTRAMUSCULAR | Status: DC | PRN
  Administered 2019-12-23: 60 mL via INTRAVENOUS

## 2019-12-23 MED ORDER — FENTANYL CITRATE (PF) 100 MCG/2ML IJ SOLN
50.0000 ug | Freq: Once | INTRAMUSCULAR | Status: AC
  Administered 2019-12-23: 50 ug via INTRAVENOUS
  Filled 2019-12-23: qty 2

## 2019-12-23 MED ORDER — ORAL CARE MOUTH RINSE: 15.0000 mL | Freq: Once | OROMUCOSAL | Status: AC

## 2019-12-23 MED ORDER — DEXAMETHASONE SODIUM PHOSPHATE 10 MG/ML IJ SOLN
INTRAMUSCULAR | Status: AC
  Filled 2019-12-23: qty 4

## 2019-12-23 MED ORDER — METHOCARBAMOL 500 MG IVPB - SIMPLE MED
500.0000 mg | Freq: Four times a day (QID) | INTRAVENOUS | Status: DC | PRN
  Administered 2019-12-23: 500 mg via INTRAVENOUS
  Filled 2019-12-23: qty 50

## 2019-12-23 MED ORDER — LIDOCAINE 2% (20 MG/ML) 5 ML SYRINGE
INTRAMUSCULAR | Status: DC | PRN
  Administered 2019-12-23: 20 mg via INTRAVENOUS

## 2019-12-23 MED ORDER — ONDANSETRON HCL 4 MG/2ML IJ SOLN: 4.0000 mg | Freq: Once | INTRAMUSCULAR | Status: DC | PRN

## 2019-12-23 MED ORDER — PHENOL 1.4 % MT LIQD: 1.0000 | OROMUCOSAL | Status: DC | PRN

## 2019-12-23 MED ORDER — 0.9 % SODIUM CHLORIDE (POUR BTL) OPTIME
TOPICAL | Status: DC | PRN
  Administered 2019-12-23: 1000 mL

## 2019-12-23 MED ORDER — PANTOPRAZOLE SODIUM 40 MG PO TBEC
40.0000 mg | DELAYED_RELEASE_TABLET | Freq: Two times a day (BID) | ORAL | Status: DC
  Administered 2019-12-23 – 2019-12-25 (×5): 40 mg via ORAL
  Filled 2019-12-23 (×5): qty 1

## 2019-12-23 SURGICAL SUPPLY — 54 items
BAG SPEC THK2 15X12 ZIP CLS (MISCELLANEOUS) ×1
BAG ZIPLOCK 12X15 (MISCELLANEOUS) ×3 IMPLANT
BLADE SAG 18X100X1.27 (BLADE) ×3 IMPLANT
BLADE SAW SGTL 11.0X1.19X90.0M (BLADE) ×3 IMPLANT
BLADE SURG SZ10 CARB STEEL (BLADE) ×6 IMPLANT
BNDG ELASTIC 6X5.8 VLCR STR LF (GAUZE/BANDAGES/DRESSINGS) ×3 IMPLANT
BOWL SMART MIX CTS (DISPOSABLE) ×3 IMPLANT
CEMENT HV SMART SET (Cement) ×6 IMPLANT
CEMENT TIBIA MBT (Knees) IMPLANT
CLOSURE WOUND 1/2 X4 (GAUZE/BANDAGES/DRESSINGS) ×1
COVER SURGICAL LIGHT HANDLE (MISCELLANEOUS) ×3 IMPLANT
COVER WAND RF STERILE (DRAPES) IMPLANT
CUFF TOURN SGL QUICK 34 (TOURNIQUET CUFF) ×3
CUFF TRNQT CYL 34X4.125X (TOURNIQUET CUFF) ×1 IMPLANT
DECANTER SPIKE VIAL GLASS SM (MISCELLANEOUS) ×3 IMPLANT
DRAPE U-SHAPE 47X51 STRL (DRAPES) ×3 IMPLANT
DRSG AQUACEL AG ADV 3.5X10 (GAUZE/BANDAGES/DRESSINGS) ×3 IMPLANT
DURAPREP 26ML APPLICATOR (WOUND CARE) ×3 IMPLANT
ELECT REM PT RETURN 15FT ADLT (MISCELLANEOUS) ×3 IMPLANT
FEMUR SIGMA PS KNEE SZ 4.0N L (Femur) ×2 IMPLANT
GLOVE BIO SURGEON STRL SZ7 (GLOVE) ×3 IMPLANT
GLOVE BIO SURGEON STRL SZ8 (GLOVE) ×3 IMPLANT
GLOVE BIOGEL PI IND STRL 7.0 (GLOVE) ×1 IMPLANT
GLOVE BIOGEL PI IND STRL 8 (GLOVE) ×1 IMPLANT
GLOVE BIOGEL PI INDICATOR 7.0 (GLOVE) ×2
GLOVE BIOGEL PI INDICATOR 8 (GLOVE) ×2
GOWN STRL REUS W/TWL LRG LVL3 (GOWN DISPOSABLE) ×6 IMPLANT
HANDPIECE INTERPULSE COAX TIP (DISPOSABLE) ×3
HOLDER FOLEY CATH W/STRAP (MISCELLANEOUS) ×2 IMPLANT
IMMOBILIZER KNEE 20 (SOFTGOODS) ×3
IMMOBILIZER KNEE 20 THIGH 36 (SOFTGOODS) ×1 IMPLANT
IMMOBILIZER KNEE 22 UNIV (SOFTGOODS) ×2 IMPLANT
KIT TURNOVER KIT A (KITS) IMPLANT
MANIFOLD NEPTUNE II (INSTRUMENTS) ×3 IMPLANT
NS IRRIG 1000ML POUR BTL (IV SOLUTION) ×3 IMPLANT
PACK TOTAL KNEE CUSTOM (KITS) ×3 IMPLANT
PADDING CAST COTTON 6X4 STRL (CAST SUPPLIES) ×6 IMPLANT
PATELLA DOME PFC 38MM (Knees) ×2 IMPLANT
PENCIL SMOKE EVACUATOR (MISCELLANEOUS) IMPLANT
PIN STEINMAN FIXATION KNEE (PIN) ×2 IMPLANT
PLATE ROT INSERT 10MM SIZE 4 (Plate) ×2 IMPLANT
PROTECTOR NERVE ULNAR (MISCELLANEOUS) ×3 IMPLANT
SET HNDPC FAN SPRY TIP SCT (DISPOSABLE) ×1 IMPLANT
STRIP CLOSURE SKIN 1/2X4 (GAUZE/BANDAGES/DRESSINGS) ×3 IMPLANT
SUT MNCRL AB 4-0 PS2 18 (SUTURE) ×3 IMPLANT
SUT STRATAFIX 0 PDS 27 VIOLET (SUTURE) ×3
SUT VIC AB 2-0 CT1 27 (SUTURE) ×9
SUT VIC AB 2-0 CT1 TAPERPNT 27 (SUTURE) ×3 IMPLANT
SUTURE STRATFX 0 PDS 27 VIOLET (SUTURE) ×1 IMPLANT
TIBIA MBT CEMENT (Knees) ×3 IMPLANT
TRAY FOLEY MTR SLVR 14FR STAT (SET/KITS/TRAYS/PACK) ×2 IMPLANT
WATER STERILE IRR 1000ML POUR (IV SOLUTION) ×6 IMPLANT
WRAP KNEE MAXI GEL POST OP (GAUZE/BANDAGES/DRESSINGS) ×3 IMPLANT
YANKAUER SUCT BULB TIP 10FT TU (MISCELLANEOUS) ×3 IMPLANT

## 2019-12-23 NOTE — Anesthesia Procedure Notes (Signed)
Anesthesia Regional Block: Adductor canal block   Pre-Anesthetic Checklist: ,, timeout performed, Correct Patient, Correct Site, Correct Laterality, Correct Procedure, Correct Position, site marked, Risks and benefits discussed,  Surgical consent,  Pre-op evaluation,  At surgeon's request and post-op pain management  Laterality: Left  Prep: Maximum Sterile Barrier Precautions used, chloraprep       Needles:  Injection technique: Single-shot  Needle Type: Echogenic Stimulator Needle     Needle Length: 9cm  Needle Gauge: 22     Additional Needles:   Procedures:,,,, ultrasound used (permanent image in chart),,,,  Narrative:  Start time: 12/23/2019 9:30 AM End time: 12/23/2019 9:36 AM Injection made incrementally with aspirations every 5 mL.  Performed by: Personally  Anesthesiologist: Pervis Hocking, DO  Additional Notes: Monitors applied. No increased pain on injection. No increased resistance to injection. Injection made in 5cc increments. Good needle visualization. Patient tolerated procedure well.

## 2019-12-23 NOTE — Plan of Care (Signed)
Plan of care 

## 2019-12-23 NOTE — Anesthesia Procedure Notes (Signed)
Date/Time: 12/23/2019 10:10 AM Performed by: Cynda Familia, CRNA Pre-anesthesia Checklist: Patient identified, Emergency Drugs available, Suction available, Patient being monitored and Timeout performed Patient Re-evaluated:Patient Re-evaluated prior to induction Oxygen Delivery Method: Simple face mask Placement Confirmation: positive ETCO2 and breath sounds checked- equal and bilateral Dental Injury: Teeth and Oropharynx as per pre-operative assessment

## 2019-12-23 NOTE — Op Note (Signed)
OPERATIVE REPORT-TOTAL KNEE ARTHROPLASTY   Pre-operative diagnosis- Osteoarthritis  Left knee(s)  Post-operative diagnosis- Osteoarthritis Left knee(s)  Procedure-  Left  Total Knee Arthroplasty  Surgeon- Dione Plover. Shuntae Herzig, MD  Assistant- Molli Barrows, PA-C   Anesthesia-  Adductor canal block and spinal  EBL-50 mL   Drains Hemovac  Tourniquet time-  Total Tourniquet Time Documented: Calf (Left) - 35 minutes Total: Calf (Left) - 35 minutes     Complications- None  Condition-PACU - hemodynamically stable.   Brief Clinical Note  Stephanie Alvarado is a 79 y.o. year old female with end stage OA of her left knee with progressively worsening pain and dysfunction. She has constant pain, with activity and at rest and significant functional deficits with difficulties even with ADLs. She has had extensive non-op management including analgesics, injections of cortisone and viscosupplements, and home exercise program, but remains in significant pain with significant dysfunction. Radiographs show bone on bone arthritis medial and patellofemoral. She presents now for left Total Knee Arthroplasty.    Procedure in detail---   The patient is brought into the operating room and positioned supine on the operating table. After successful administration of  Adductor canal block and spinal,   a tourniquet is placed high on the  Left thigh(s) and the lower extremity is prepped and draped in the usual sterile fashion. Time out is performed by the operating team and then the  Left lower extremity is wrapped in Esmarch, knee flexed and the tourniquet inflated to 300 mmHg.       A midline incision is made with a ten blade through the subcutaneous tissue to the level of the extensor mechanism. A fresh blade is used to make a medial parapatellar arthrotomy. Soft tissue over the proximal medial tibia is subperiosteally elevated to the joint line with a knife and into the semimembranosus bursa with a Cobb elevator.  Soft tissue over the proximal lateral tibia is elevated with attention being paid to avoiding the patellar tendon on the tibial tubercle. The patella is everted, knee flexed 90 degrees and the ACL and PCL are removed. Findings are bone on bone medial and patellofemoral with large global osteophytes        The drill is used to create a starting hole in the distal femur and the canal is thoroughly irrigated with sterile saline to remove the fatty contents. The 5 degree Left  valgus alignment guide is placed into the femoral canal and the distal femoral cutting block is pinned to remove 10 mm off the distal femur. Resection is made with an oscillating saw.      The tibia is subluxed forward and the menisci are removed. The extramedullary alignment guide is placed referencing proximally at the medial aspect of the tibial tubercle and distally along the second metatarsal axis and tibial crest. The block is pinned to remove 34mm off the more deficient medial  side. Resection is made with an oscillating saw. Size 3is the most appropriate size for the tibia and the proximal tibia is prepared with the modular drill and keel punch for that size.      The femoral sizing guide is placed and size 4 is most appropriate. Rotation is marked off the epicondylar axis and confirmed by creating a rectangular flexion gap at 90 degrees. The size 4 cutting block is pinned in this rotation and the anterior, posterior and chamfer cuts are made with the oscillating saw. The intercondylar block is then placed and that cut is made.  Trial size 3 tibial component, trial size 4 narrow posterior stabilized femur and a 10  mm posterior stabilized rotating platform insert trial is placed. Full extension is achieved with excellent varus/valgus and anterior/posterior balance throughout full range of motion. The patella is everted and thickness measured to be 22  mm. Free hand resection is taken to 12 mm, a 38 template is placed, lug holes are  drilled, trial patella is placed, and it tracks normally. Osteophytes are removed off the posterior femur with the trial in place. All trials are removed and the cut bone surfaces prepared with pulsatile lavage. Cement is mixed and once ready for implantation, the size 3 tibial implant, size  4 narrow posterior stabilized femoral component, and the size 38 patella are cemented in place and the patella is held with the clamp. The trial insert is placed and the knee held in full extension. The Exparel (20 ml mixed with 60 ml saline) is injected into the extensor mechanism, posterior capsule, medial and lateral gutters and subcutaneous tissues.  All extruded cement is removed and once the cement is hard the permanent 10 mm posterior stabilized rotating platform insert is placed into the tibial tray.      The wound is copiously irrigated with saline solution and the extensor mechanism closed with #1 V-loc suture. The tourniquet is released for a total tourniquet time of 35  minutes. Flexion against gravity is 140 degrees and the patella tracks normally. Subcutaneous tissue is closed with 2.0 vicryl and subcuticular with running 4.0 Monocryl. The incision is cleaned and dried and steri-strips and a bulky sterile dressing are applied. The limb is placed into a knee immobilizer and the patient is awakened and transported to recovery in stable condition.      Please note that a surgical assistant was a medical necessity for this procedure in order to perform it in a safe and expeditious manner. Surgical assistant was necessary to retract the ligaments and vital neurovascular structures to prevent injury to them and also necessary for proper positioning of the limb to allow for anatomic placement of the prosthesis.   Dione Plover Montarius Kitagawa, MD    12/23/2019, 11:21 AM

## 2019-12-23 NOTE — Interval H&P Note (Signed)
History and Physical Interval Note:  12/23/2019 8:19 AM  Stephanie Alvarado  has presented today for surgery, with the diagnosis of left knee osteoarthritis.  The various methods of treatment have been discussed with the patient and family. After consideration of risks, benefits and other options for treatment, the patient has consented to  Procedure(s) with comments: TOTAL KNEE ARTHROPLASTY (Left) - 89min as a surgical intervention.  The patient's history has been reviewed, patient examined, no change in status, stable for surgery.  I have reviewed the patient's chart and labs.  Questions were answered to the patient's satisfaction.     Stephanie Alvarado

## 2019-12-23 NOTE — Discharge Instructions (Addendum)
 Frank Aluisio, MD Total Joint Specialist [facility] [address] [place], [place] [address] [phone]  TOTAL KNEE REPLACEMENT POSTOPERATIVE DIRECTIONS    Knee Rehabilitation, Guidelines Following Surgery  Results after knee surgery are often greatly improved when you follow the exercise, range of motion and muscle strengthening exercises prescribed by your doctor. Safety measures are also important to protect the knee from further injury. If any of these exercises cause you to have increased pain or swelling in your knee joint, decrease the amount until you are comfortable again and slowly increase them. If you have problems or questions, call your caregiver or physical therapist for advice.   BLOOD CLOT PREVENTION . Take a 10 mg Xarelto once a day for three weeks following surgery. Then resume one 81 mg Aspirin once a day. . You may resume your vitamins/supplements once you have discontinued the Xarelto. . Do not take any NSAIDs (Advil, Aleve, Ibuprofen, Meloxicam, etc.) until you have discontinued the Xarelto.   HOME CARE INSTRUCTIONS  . Remove items at home which could result in a fall. This includes throw rugs or furniture in walking pathways.  . ICE to the affected knee as much as tolerated. Icing helps control swelling. If the swelling is well controlled you will be more comfortable and rehab easier. Continue to use ice on the knee for pain and swelling from surgery. You may notice swelling that will progress down to the foot and ankle. This is normal after surgery. Elevate the leg when you are not up walking on it.    . Continue to use the breathing machine which will help keep your temperature down. It is common for your temperature to cycle up and down following surgery, especially at night when you are not up moving around and exerting yourself. The breathing machine keeps your lungs expanded and your temperature down. . Do not place pillow under  the operative knee, focus on keeping the knee straight while resting  DIET You may resume your previous home diet once you are discharged from the hospital.  DRESSING / WOUND CARE / SHOWERING . Keep your bulky bandage on for 2 days. On the third post-operative day you may remove the Ace bandage and gauze. There is a waterproof adhesive bandage on your skin which will stay in place until your first follow-up appointment. Once you remove this you will not need to place another bandage . You may begin showering 3 days following surgery, but do not submerge the incision under water.  ACTIVITY For the first 5 days, the key is rest and control of pain and swelling . Do your home exercises twice a day starting on post-operative day 3. On the days you go to physical therapy, just do the home exercises once that day. . You should rest, ice and elevate the leg for 50 minutes out of every hour. Get up and walk/stretch for 10 minutes per hour. After 5 days you can increase your activity slowly as tolerated. . Walk with your walker as instructed. Use the walker until you are comfortable transitioning to a cane. Walk with the cane in the opposite hand of the operative leg. You may discontinue the cane once you are comfortable and walking steadily. . Avoid periods of inactivity such as sitting longer than an hour when not asleep. This helps prevent blood clots.  . You may discontinue the knee immobilizer once you are able to perform a straight leg raise while lying down. . You   may resume a sexual relationship in one month or when given the OK by your doctor.   You may return to work once you are cleared by your doctor.   Do not drive a car for 6 weeks or until released by your surgeon.   Do not drive while taking narcotics.  TED HOSE STOCKINGS Wear the elastic stockings on both legs for three weeks following surgery during the day. You may remove them at night for sleeping.  WEIGHT BEARING Weight  bearing as tolerated with assist device (walker, cane, etc) as directed, use it as long as suggested by your surgeon or therapist, typically at least 4-6 weeks.  POSTOPERATIVE CONSTIPATION PROTOCOL Constipation - defined medically as fewer than three stools per week and severe constipation as less than one stool per week.  One of the most common issues patients have following surgery is constipation.  Even if you have a regular bowel pattern at home, your normal regimen is likely to be disrupted due to multiple reasons following surgery.  Combination of anesthesia, postoperative narcotics, change in appetite and fluid intake all can affect your bowels.  In order to avoid complications following surgery, here are some recommendations in order to help you during your recovery period.   Colace (docusate) - Pick up an over-the-counter form of Colace or another stool softener and take twice a day as long as you are requiring postoperative pain medications.  Take with a full glass of water daily.  If you experience loose stools or diarrhea, hold the colace until you stool forms back up. If your symptoms do not get better within 1 week or if they get worse, check with your doctor.  Dulcolax (bisacodyl) - Pick up over-the-counter and take as directed by the product packaging as needed to assist with the movement of your bowels.  Take with a full glass of water.  Use this product as needed if not relieved by Colace only.   MiraLax (polyethylene glycol) - Pick up over-the-counter to have on hand. MiraLax is a solution that will increase the amount of water in your bowels to assist with bowel movements.  Take as directed and can mix with a glass of water, juice, soda, coffee, or tea. Take if you go more than two days without a movement. Do not use MiraLax more than once per day. Call your doctor if you are still constipated or irregular after using this medication for 7 days in a row.  If you continue to have  problems with postoperative constipation, please contact the office for further assistance and recommendations.  If you experience "the worst abdominal pain ever" or develop nausea or vomiting, please contact the office immediatly for further recommendations for treatment.  ITCHING If you experience itching with your medications, try taking only a single pain pill, or even half a pain pill at a time.  You can also use Benadryl over the counter for itching or also to help with sleep.   MEDICATIONS See your medication summary on the After Visit Summary that the nursing staff will review with you prior to discharge.  You may have some home medications which will be placed on hold until you complete the course of blood thinner medication.  It is important for you to complete the blood thinner medication as prescribed by your surgeon.  Continue your approved medications as instructed at time of discharge.  PRECAUTIONS  If you experience chest pain or shortness of breath - call 911 immediately for transfer  to the hospital emergency department.  . If you develop a fever greater that 101 F, purulent drainage from wound, increased redness or drainage from wound, foul odor from the wound/dressing, or calf pain - CONTACT YOUR SURGEON.                                                   FOLLOW-UP APPOINTMENTS Make sure you keep all of your appointments after your operation with your surgeon and caregivers. You should call the office at the above phone number and make an appointment for approximately two weeks after the date of your surgery or on the date instructed by your surgeon outlined in the "After Visit Summary".  RANGE OF MOTION AND STRENGTHENING EXERCISES  Rehabilitation of the knee is important following a knee injury or an operation. After just a few days of immobilization, the muscles of the thigh which control the knee become weakened and shrink (atrophy). Knee exercises are designed to build up the  tone and strength of the thigh muscles and to improve knee motion. Often times heat used for twenty to thirty minutes before working out will loosen up your tissues and help with improving the range of motion but do not use heat for the first two weeks following surgery. These exercises can be done on a training (exercise) mat, on the floor, on a table or on a bed. Use what ever works the best and is most comfortable for you Knee exercises include:  . Leg Lifts - While your knee is still immobilized in a splint or cast, you can do straight leg raises. Lift the leg to 60 degrees, hold for 3 sec, and slowly lower the leg. Repeat 10-20 times 2-3 times daily. Perform this exercise against resistance later as your knee gets better.  . Quad and Hamstring Sets - Tighten up the muscle on the front of the thigh (Quad) and hold for 5-10 sec. Repeat this 10-20 times hourly. Hamstring sets are done by pushing the foot backward against an object and holding for 5-10 sec. Repeat as with quad sets.   Leg Slides: Lying on your back, slowly slide your foot toward your buttocks, bending your knee up off the floor (only go as far as is comfortable). Then slowly slide your foot back down until your leg is flat on the floor again.  Angel Wings: Lying on your back spread your legs to the side as far apart as you can without causing discomfort.  A rehabilitation program following serious knee injuries can speed recovery and prevent re-injury in the future due to weakened muscles. Contact your doctor or a physical therapist for more information on knee rehabilitation.   IF YOU ARE TRANSFERRED TO A SKILLED REHAB FACILITY If the patient is transferred to a skilled rehab facility following release from the hospital, a list of the current medications will be sent to the facility for the patient to continue.  When discharged from the skilled rehab facility, please have the facility set up the patient's Home Health Physical Therapy  prior to being released. Also, the skilled facility will be responsible for providing the patient with their medications at time of release from the facility to include their pain medication, the muscle relaxants, and their blood thinner medication. If the patient is still at the rehab facility at time of the two   week follow up appointment, the skilled rehab facility will also need to assist the patient in arranging follow up appointment in our office and any transportation needs.  MAKE SURE YOU:   Understand these instructions.   Get help right away if you are not doing well or get worse.   DENTAL ANTIBIOTICS:  In most cases prophylactic antibiotics for Dental procdeures after total joint surgery are not necessary.  Exceptions are as follows:  1. History of prior total joint infection  2. Severely immunocompromised (Organ Transplant, cancer chemotherapy, Rheumatoid biologic meds such as Mathews)  3. Poorly controlled diabetes (A1C &gt; 8.0, blood glucose over 200)  If you have one of these conditions, contact your surgeon for an antibiotic prescription, prior to your dental procedure.    Pick up stool softner and laxative for home use following surgery while on pain medications. Do not submerge incision under water. Please use good hand washing techniques while changing dressing each day. May shower starting three days after surgery. Please use a clean towel to pat the incision dry following showers. Continue to use ice for pain and swelling after surgery. Do not use any lotions or creams on the incision until instructed by your surgeon.  Information on my medicine - XARELTO (Rivaroxaban)  This medication education was reviewed with me or my healthcare representative as part of my discharge preparation.  The pharmacist that spoke with me during my hospital stay was:    Why was Xarelto prescribed for you? Xarelto was prescribed for you to reduce the risk of blood clots forming  after orthopedic surgery. The medical term for these abnormal blood clots is venous thromboembolism (VTE).  What do you need to know about xarelto ? Take your Xarelto ONCE DAILY at the same time every day. You may take it either with or without food.  If you have difficulty swallowing the tablet whole, you may crush it and mix in applesauce just prior to taking your dose.  Take Xarelto exactly as prescribed by your doctor and DO NOT stop taking Xarelto without talking to the doctor who prescribed the medication.  Stopping without other VTE prevention medication to take the place of Xarelto may increase your risk of developing a clot.  After discharge, you should have regular check-up appointments with your healthcare provider that is prescribing your Xarelto.    What do you do if you miss a dose? If you miss a dose, take it as soon as you remember on the same day then continue your regularly scheduled once daily regimen the next day. Do not take two doses of Xarelto on the same day.   Important Safety Information A possible side effect of Xarelto is bleeding. You should call your healthcare provider right away if you experience any of the following: ? Bleeding from an injury or your nose that does not stop. ? Unusual colored urine (red or dark brown) or unusual colored stools (red or black). ? Unusual bruising for unknown reasons. ? A serious fall or if you hit your head (even if there is no bleeding).  Some medicines may interact with Xarelto and might increase your risk of bleeding while on Xarelto. To help avoid this, consult your healthcare provider or pharmacist prior to using any new prescription or non-prescription medications, including herbals, vitamins, non-steroidal anti-inflammatory drugs (NSAIDs) and supplements.  This website has more information on Xarelto: [email or web address]

## 2019-12-23 NOTE — Progress Notes (Signed)
Orthopedic Tech Progress Note Patient Details:  Stephanie Alvarado 1941/05/05 [id number]  Patient ID: Stephanie Alvarado, female   DOB: 10/02/1940, 79 y.o.   MRN: [id number]   [name] 12/23/2019, 3:53 PM cpm removed @1553 . Pt very uncomfortable

## 2019-12-23 NOTE — Anesthesia Postprocedure Evaluation (Signed)
Anesthesia Post Note  Patient: AVALIN BRILEY  Procedure(s) Performed: TOTAL KNEE ARTHROPLASTY (Left Knee)     Patient location during evaluation: PACU Anesthesia Type: Regional, MAC and Spinal Level of consciousness: awake and alert Pain management: pain level controlled Vital Signs Assessment: post-procedure vital signs reviewed and stable Respiratory status: spontaneous breathing, nonlabored ventilation, respiratory function stable and patient connected to nasal cannula oxygen Cardiovascular status: stable and blood pressure returned to baseline Postop Assessment: no apparent nausea or vomiting, patient able to bend at knees, spinal receding, no headache and no backache Anesthetic complications: no   No complications documented.  Last Vitals:  Vitals:   12/23/19 1315 12/23/19 1330  BP: 121/66 135/64  Pulse: (!) 53 61  Resp: 10 15  Temp:  (!) 35.6 C  SpO2: 97% 95%    Last Pain:  Vitals:   12/23/19 1330  TempSrc:   PainSc: 10-Worst pain ever                 Pervis Hocking

## 2019-12-23 NOTE — Anesthesia Procedure Notes (Signed)
Spinal  Patient location during procedure: OR Start time: 12/23/2019 10:15 AM End time: 12/23/2019 10:20 AM Staffing Performed: anesthesiologist  Anesthesiologist: Pervis Hocking, DO Preanesthetic Checklist Completed: patient identified, IV checked, risks and benefits discussed, surgical consent, monitors and equipment checked, pre-op evaluation and timeout performed Spinal Block Patient position: sitting Prep: DuraPrep and site prepped and draped Patient monitoring: cardiac monitor, continuous pulse ox and blood pressure Approach: midline Location: L3-4 Injection technique: single-shot Needle Needle type: Pencan  Needle gauge: 24 G Needle length: 9 cm Assessment Sensory level: T6 Additional Notes Functioning IV was confirmed and monitors were applied. Sterile prep and drape, including hand hygiene and sterile gloves were used. The patient was positioned and the spine was prepped. The skin was anesthetized with lidocaine.  Free flow of clear CSF was obtained prior to injecting local anesthetic into the CSF.  The spinal needle aspirated freely following injection.  The needle was carefully withdrawn.  The patient tolerated the procedure well.

## 2019-12-23 NOTE — Progress Notes (Signed)
Assisted Dr. Beth Finucane with left, ultrasound guided, adductor canal block. Side rails up, monitors on throughout procedure. See vital signs in flow sheet. Tolerated Procedure well. ° °

## 2019-12-23 NOTE — Transfer of Care (Signed)
Immediate Anesthesia Transfer of Care Note  Patient: Stephanie Alvarado  Procedure(s) Performed: TOTAL KNEE ARTHROPLASTY (Left Knee)  Patient Location: PACU  Anesthesia Type:MAC and Spinal  Level of Consciousness: awake, alert , oriented and patient cooperative  Airway & Oxygen Therapy: Patient Spontanous Breathing and Patient connected to face mask oxygen  Post-op Assessment: Report given to RN and Post -op Vital signs reviewed and stable  Post vital signs: Reviewed and stable  Last Vitals:  Vitals Value Taken Time  BP 100/57 12/23/19 1145  Temp    Pulse 55 12/23/19 1146  Resp 16 12/23/19 1146  SpO2 100 % 12/23/19 1146  Vitals shown include unvalidated device data.  Last Pain:  Vitals:   12/23/19 0844  TempSrc:   PainSc: 4       Patients Stated Pain Goal: 6 (74/08/14 4818)  Complications: No complications documented.

## 2019-12-23 NOTE — Plan of Care (Signed)

## 2019-12-23 NOTE — Evaluation (Signed)
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 Pt has been using a cane since her last surgery in 2020  Clinical Impression  Pt is s/p L TKA resulting in the deficits listed below (see PT Problem List).  Pt will benefit from skilled PT to increase their independence and safety with mobility to allow discharge to the venue listed below. Feel pt would benefit from ST-SNF due to being alone, reporting multiple falls to this PT during assessment over the past few months and year. Pt also expressed not doing great after her last TKA. Will continue to follow her and update her progress here on acute as well.      Follow Up Recommendations Follow surgeon's recommendation for DC plan and follow-up therapies (pt mentioned Dr discussion SNF with her)    Equipment Recommendations  Rolling walker with 5" wheels    Recommendations for Other Services       Precautions / Restrictions Precautions Precautions: Knee Precaution Booklet Issued: No Precaution Comments: educated pt about not having pillow under knee. she likes this because it eases the pain, however we reviewed why she should not rest this way. Restrictions Weight Bearing Restrictions: No      Mobility  Bed Mobility Overal bed mobility: Needs Assistance Bed Mobility: Supine to Sit;Sit to Supine     Supine to sit: Min assist        Transfers Overall transfer level: Needs assistance Equipment used: Rolling walker (2 wheeled) Transfers: Sit to/from Stand Sit to Stand: Mod assist;From elevated surface         General transfer comment: difficulty with lift off and needed boost of assistance and cues for safety . pt with difficluty focusing with pain and inching  Ambulation/Gait Ambulation/Gait  assistance: Mod assist;+2 safety/equipment Gait Distance (Feet): 3 Feet (few steps with pivot to chair . didn't ambulate due to pt's pain level and feeling very " weak") Assistive device: Rolling walker (2 wheeled) Gait Pattern/deviations: Step-to pattern        Stairs            Wheelchair Mobility    Modified Rankin (Stroke Patients Only)       Balance Overall balance assessment: Needs assistance Sitting-balance support: Bilateral upper extremity supported Sitting balance-Leahy Scale: Good     Standing balance support: Bilateral upper extremity supported;During functional activity Standing balance-Leahy Scale: Fair                               Pertinent Vitals/Pain Pain Assessment: 0-10 Pain Score: 10-Worst pain ever Pain Location: front of L knee and all the way down Pain Descriptors / Indicators: Burning;Throbbing;Aching Pain Intervention(s): Limited activity within patient's tolerance;Monitored during session;Ice applied    Home Living Family/patient expects to be discharged to:: Private residence (pt states she has been talking about SNF at [facility] with MD) Living Arrangements: Alone Available Help at Discharge: Other (Comment) (pt's daughter will not be able to assist pt at discharge) Type of Home: House Home Access: Level entry     Home Layout: One level Home Equipment: Cane - single point;Bedside commode;Walker - 2 wheels      Prior Function Level of Independence: Independent with assistive device(s)  Comments: uses cane since last knee surgery in 2020     Hand Dominance        Extremity/Trunk Assessment        Lower Extremity Assessment Lower Extremity Assessment: Generalized weakness (L LE weakness not assessed fully due to TKA)       Communication   Communication: No difficulties (little " goofy feeling" per patient)  Cognition Arousal/Alertness: Awake/alert Behavior During Therapy: WFL for tasks  assessed/performed Overall Cognitive Status: Within Functional Limits for tasks assessed                                        General Comments      Exercises Total Joint Exercises Ankle Circles/Pumps: AROM;10 reps;Supine Quad Sets: 5 reps;Left;AROM;Supine Heel Slides: AAROM;Left;5 reps;Supine Goniometric ROM: 0-50 ( limited with pain )   Assessment/Plan    PT Assessment Patient needs continued PT services  PT Problem List Decreased strength;Decreased range of motion;Decreased activity tolerance;Decreased mobility       PT Treatment Interventions DME instruction;Gait training;Functional mobility training;Therapeutic activities;Therapeutic exercise;Patient/family education    PT Goals (Current goals can be found in the Care Plan section)  Acute Rehab PT Goals Patient Stated Goal: I want to be able to walk again PT Goal Formulation: With patient Time For Goal Achievement: 01/06/20 Potential to Achieve Goals: Good    Frequency 7X/week   Barriers to discharge Decreased caregiver support      Co-evaluation               AM-PAC PT "6 Clicks" Mobility  Outcome Measure Help needed turning from your back to your side while in a flat bed without using bedrails?: A Lot Help needed moving from lying on your back to sitting on the side of a flat bed without using bedrails?: A Lot Help needed moving to and from a bed to a chair (including a wheelchair)?: A Lot Help needed standing up from a chair using your arms (e.g., wheelchair or bedside chair)?: A Lot Help needed to walk in hospital room?: A Lot Help needed climbing 3-5 steps with a railing? : A Lot 6 Click Score: 12    End of Session Equipment Utilized During Treatment: Gait belt Activity Tolerance: Patient limited by pain Patient left: in chair;with call bell/phone within reach;with chair alarm set Nurse Communication: Mobility status PT Visit Diagnosis: Other abnormalities of gait and mobility  (R26.89);Repeated falls (R29.6);Muscle weakness (generalized) (M62.81)    Time: 6967-8938 PT Time Calculation (min) (ACUTE ONLY): 32 min   Charges:   PT Evaluation $PT Eval Low Complexity: 1 Low PT Treatments $Therapeutic Exercise: 8-22 mins        Shauntavia Brackin, PT, MPT Acute Rehabilitation Services Office: [phone] Pager: [phone] 12/23/2019   Clide Dales 12/23/2019, 4:40 PM

## 2019-12-24 ENCOUNTER — Encounter (HOSPITAL_COMMUNITY): Payer: Self-pay | Admitting: Orthopedic Surgery

## 2019-12-24 LAB — BASIC METABOLIC PANEL
Anion gap: 10 (ref 5–15)
BUN: 10 mg/dL (ref 8–23)
CO2: 26 mmol/L (ref 22–32)
Calcium: 9.4 mg/dL (ref 8.9–10.3)
Chloride: 103 mmol/L (ref 98–111)
Creatinine, Ser: 0.75 mg/dL (ref 0.44–1.00)
GFR calc Af Amer: >60 mL/min (ref >60)
GFR calc non Af Amer: >60 mL/min (ref >60)
Glucose, Bld: 142 mg/dL — ABNORMAL HIGH (ref 70–99)
Potassium: 4.4 mmol/L (ref 3.5–5.1)
Sodium: 139 mmol/L (ref 135–145)

## 2019-12-24 LAB — CBC
HCT: 36.6 % (ref 36.0–46.0)
Hemoglobin: 11.5 g/dL — ABNORMAL LOW (ref 12.0–15.0)
MCH: 28.2 pg (ref 26.0–34.0)
MCHC: 31.4 g/dL (ref 30.0–36.0)
MCV: 89.7 fL (ref 80.0–100.0)
Platelets: 144 10*3/uL — ABNORMAL LOW (ref 150–400)
RBC: 4.08 MIL/uL (ref 3.87–5.11)
RDW: 16.8 % — ABNORMAL HIGH (ref 11.5–15.5)
WBC: 11.9 10*3/uL — ABNORMAL HIGH (ref 4.0–10.5)
nRBC: 0 % (ref 0.0–0.2)

## 2019-12-24 NOTE — NC FL2 (Signed)
[place] LEVEL OF CARE SCREENING TOOL     IDENTIFICATION  Patient Name: Stephanie Alvarado Birthdate: [birth year] Sex: female Admission Date (Current Location): 12/23/2019  [facility] and [place] Number:  Herbalist and Address:  [facility],  [place] [address] [place]      Provider Number: [id number]  Attending Physician Name and Address:  Gaynelle Arabian, MD  Relative Name and Phone Number:       Current Level of Care: Hospital Recommended Level of Care: [place] Prior Approval Number:    Date Approved/Denied:   PASRR Number: [id number]  Discharge Plan: SNF    Current Diagnoses: Patient Active Problem List   Diagnosis Date Noted  . Primary osteoarthritis of left knee 12/23/2019  . Diarrhea 09/06/2017  . RLQ abdominal pain 05/26/2017  . Lightheadedness   . Ataxia 05/30/2016  . Dizziness 05/30/2016  . Preoperative clearance 11/10/2015  . Varicose veins of left lower extremity with complications 13/01/6577  . Varicose veins of leg with complications 46/96/2952  . Lumbar scoliosis 11/21/2013  . Preop cardiovascular exam 10/29/2013  . LLQ abdominal pain 04/05/2013  . Diverticulitis 04/05/2013  . Palpitations   . Subclavian artery disease ([place])   . PFO (patent foramen ovale)   . CAD (coronary artery disease)   . Ejection fraction   . Atherosclerosis of aorta ([place])   . Carotid artery disease ([place])   . Ischemic bowel disease ([place]) 09/10/2010  . CONSTIPATION 06/11/2010  . ABDOMINAL PAIN-LLQ 06/11/2010  . History of colonic polyps 06/11/2010  . SLEEP RELATED HYPOVENTILATION/HYPOXEMIA CCE 07/17/2009  . Stroke ([place]) 11/11/2008  . COLONIC POLYPS 08/31/2008  . THYROID CYST 08/31/2008  . VENOUS INSUFFICIENCY 08/31/2008  . DIVERTICULOSIS OF COLON 08/31/2008  . LOW BACK PAIN, CHRONIC 08/31/2008  . 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 Dyspnea 08/08/2008  . HYPERCHOLESTEROLEMIA 04/19/2007  . OBESITY 04/19/2007  . ANEMIA  04/19/2007  . ANXIETY 04/19/2007  . Essential hypertension 04/19/2007  . ALLERGIC RHINITIS 04/19/2007  . PULMONARY NODULE, RIGHT LOWER LOBE 04/19/2007  . GERD 04/19/2007  . OA (osteoarthritis) of knee 04/19/2007    Orientation RESPIRATION BLADDER Height & Weight     Self, Time, Situation, Place  Normal Continent Weight: 201 lb 4.5 oz (91.3 kg) Height:  5\' 7"  (170.2 cm)  BEHAVIORAL SYMPTOMS/MOOD NEUROLOGICAL BOWEL NUTRITION STATUS      Continent Diet (See dc summary)  AMBULATORY STATUS COMMUNICATION OF NEEDS Skin   Extensive Assist Verbally Surgical wounds (Left knee)                       Personal Care Assistance Level of Assistance  Bathing, Dressing, Feeding Bathing Assistance: Limited assistance Feeding assistance: Independent Dressing Assistance: Limited assistance     Functional Limitations Info  Sight, Hearing, Speech Sight Info: Adequate Hearing Info: Adequate Speech Info: Adequate    SPECIAL CARE FACTORS FREQUENCY  PT (By licensed PT)     PT Frequency: 5x/week              Contractures Contractures Info: Not present    Additional Factors Info  Code Status, Allergies Code Status Info: Full Allergies Info: Codeine           Current Medications (12/24/2019):  This is the current hospital active medication list Current Facility-Administered Medications  Medication Dose Route Frequency Provider Last Rate Last Admin  . 0.9 %  sodium chloride infusion   Intravenous Continuous Edmisten, Kristie L, PA 75 mL/hr at 12/23/19  [place] at 12/23/19 1538  . acetaminophen (TYLENOL) tablet 1,000 mg  1,000 mg Oral Q6H Edmisten, Kristie L, PA   1,000 mg at 12/24/19 0609  . amLODipine (NORVASC) tablet 5 mg  5 mg Oral q morning - 10a Edmisten, Kristie L, PA      . bisacodyl (DULCOLAX) suppository 10 mg  10 mg Rectal Daily PRN Edmisten, Kristie L, PA      . buPROPion (WELLBUTRIN XL) 24 hr tablet 150 mg  150 mg Oral BID Edmisten, Kristie L, PA   150 mg at  12/24/19 0850  . diphenhydrAMINE (BENADRYL) 12.5 MG/5ML elixir 12.5-25 mg  12.5-25 mg Oral Q4H PRN Edmisten, Kristie L, PA   12.5 mg at 12/23/19 1901  . docusate sodium (COLACE) capsule 100 mg  100 mg Oral BID Edmisten, Kristie L, PA   100 mg at 12/24/19 0850  . gabapentin (NEURONTIN) capsule 600 mg  600 mg Oral TID Edmisten, Kristie L, PA   600 mg at 12/24/19 0850  . isosorbide mononitrate (IMDUR) 24 hr tablet 30 mg  30 mg Oral QHS Edmisten, Kristie L, PA   30 mg at 12/23/19 2145  . menthol-cetylpyridinium (CEPACOL) lozenge 3 mg  1 lozenge Oral PRN Edmisten, Kristie L, PA       Or  . phenol (CHLORASEPTIC) mouth spray 1 spray  1 spray Mouth/Throat PRN Edmisten, Kristie L, PA      . methocarbamol (ROBAXIN) tablet 500 mg  500 mg Oral Q6H PRN Edmisten, Kristie L, PA   500 mg at 12/24/19 0326   Or  . methocarbamol (ROBAXIN) 500 mg in dextrose 5 % 50 mL IVPB  500 mg Intravenous Q6H PRN Edmisten, Kristie L, PA 100 mL/hr at 12/23/19 1227 500 mg at 12/23/19 1227  . metoCLOPramide (REGLAN) tablet 5-10 mg  5-10 mg Oral Q8H PRN Edmisten, Kristie L, PA       Or  . metoCLOPramide (REGLAN) injection 5-10 mg  5-10 mg Intravenous Q8H PRN Edmisten, Kristie L, PA      . metoprolol tartrate (LOPRESSOR) tablet 50 mg  50 mg Oral BID Edmisten, Kristie L, PA   50 mg at 12/24/19 0850  . morphine 2 MG/ML injection 0.5-1 mg  0.5-1 mg Intravenous Q2H PRN Edmisten, Kristie L, PA   1 mg at 12/23/19 2145  . nitroGLYCERIN (NITROSTAT) SL tablet 0.4 mg  0.4 mg Sublingual Q5 min PRN Edmisten, Kristie L, PA      . ondansetron (ZOFRAN) tablet 4 mg  4 mg Oral Q6H PRN Edmisten, Kristie L, PA       Or  . ondansetron (ZOFRAN) injection 4 mg  4 mg Intravenous Q6H PRN Edmisten, Kristie L, PA   4 mg at 12/23/19 1828  . oxyCODONE (Oxy IR/ROXICODONE) immediate release tablet 10 mg  10 mg Oral Q4H PRN Edmisten, Kristie L, PA   10 mg at 12/24/19 0327  . oxyCODONE (Oxy IR/ROXICODONE) immediate release tablet 5 mg  5 mg Oral Q4H PRN Edmisten,  Kristie L, PA   5 mg at 12/24/19 0849  . pantoprazole (PROTONIX) EC tablet 40 mg  40 mg Oral BID Edmisten, Kristie L, PA   40 mg at 12/24/19 0850  . polyethylene glycol (MIRALAX / GLYCOLAX) packet 17 g  17 g Oral Daily PRN Edmisten, Kristie L, PA      . polyvinyl alcohol (LIQUIFILM TEARS) 1.4 % ophthalmic solution 1 drop  1 drop Both Eyes PRN Edmisten, Kristie L, PA      . rivaroxaban (  XARELTO) tablet 10 mg  10 mg Oral Q breakfast Edmisten, Kristie L, PA   10 mg at 12/24/19 0851  . rosuvastatin (CRESTOR) tablet 10 mg  10 mg Oral QHS Edmisten, Kristie L, PA   10 mg at 12/23/19 2145  . sodium phosphate (FLEET) 7-19 GM/118ML enema 1 enema  1 enema Rectal Once PRN Edmisten, Ok Anis, PA         Discharge Medications: Please see discharge summary for a list of discharge medications.  Relevant Imaging Results:  Relevant Lab Results:   Additional Information ssn: [id number]  Servando Snare, LCSW

## 2019-12-24 NOTE — Progress Notes (Signed)
Physical Therapy Treatment Patient Details Name: Stephanie Alvarado MRN: [id number] DOB: [birth year] Today's Date: 12/24/2019    History of Present Illness pt s/p LTKA 12/23/2019 with history of RTKA Aug 2020 , and back surgerys, and reports of more frequent and recent falls . Pt has been using a cane since her last surgery in 2020    PT Comments    POD # 1 am session Assisted OOB.  General bed mobility comments: assist with L LE and increased time.  General transfer comment: 25% VC's on proper hand placement and increased effort to rise from elevated bed.  Initial posterior lean.  Slightly unsteady.General Gait Details: 25% VC's on proper walker to self distance esp with turns.  Unsteady.  Poor forward flex posture (Hx 3 back surgeries).  Then returned to room to perform some TE's following HEP handout.  Instructed on proper tech, freq as well as use of ICE.     Follow Up Recommendations  Follow surgeon's recommendation for DC plan and follow-up therapies;SNF     Equipment Recommendations       Recommendations for Other Services       Precautions / Restrictions Precautions Precautions: Knee Precaution Comments: reviewed NO pillow left under knee Restrictions Weight Bearing Restrictions: No Other Position/Activity Restrictions: WBAT    Mobility  Bed Mobility Overal bed mobility: Needs Assistance Bed Mobility: Supine to Sit     Supine to sit: Min assist     General bed mobility comments: assist with L LE and increased time  Transfers Overall transfer level: Needs assistance Equipment used: Rolling walker (2 wheeled) Transfers: Sit to/from Stand Sit to Stand: Mod assist;Min assist         General transfer comment: 25% VC's on proper hand placement and increased effort to rise from elevated bed.  Initial posterior lean.  Slightly unsteady.  Ambulation/Gait Ambulation/Gait assistance: Min assist;Mod assist Gait Distance (Feet): 24 Feet Assistive device: Rolling walker (2  wheeled) Gait Pattern/deviations: Step-to pattern;Decreased stance time - left;Trunk flexed Gait velocity: decreased   General Gait Details: 25% VC's on proper walker to self distance esp with turns.  Unsteady.  Poor forward flex posture (Hx 3 back surgeries)   Stairs             Wheelchair Mobility    Modified Rankin (Stroke Patients Only)       Balance                                            Cognition Arousal/Alertness: Awake/alert Behavior During Therapy: WFL for tasks assessed/performed Overall Cognitive Status: Within Functional Limits for tasks assessed                                        Exercises   Total Knee Replacement TE's following HEP handout 10 reps B LE ankle pumps 05 reps towel squeezes 05 reps knee presses 05 reps heel slides  05 reps SAQ's 05 reps SLR's 05 reps ABD Educated on use of gait belt to assist with TE's Followed by ICE     General Comments        Pertinent Vitals/Pain Pain Assessment: 0-10 Pain Score: 5  Pain Location: L knee Pain Descriptors / Indicators: Grimacing;Operative site guarding;Tender Pain Intervention(s): Monitored during session;Premedicated before session;Repositioned;Ice applied  Home Living                      Prior Function            PT Goals (current goals can now be found in the care plan section)      Frequency    7X/week      PT Plan Current plan remains appropriate    Co-evaluation              AM-PAC PT "6 Clicks" Mobility   Outcome Measure  Help needed turning from your back to your side while in a flat bed without using bedrails?: A Lot Help needed moving from lying on your back to sitting on the side of a flat bed without using bedrails?: A Lot Help needed moving to and from a bed to a chair (including a wheelchair)?: A Lot Help needed standing up from a chair using your arms (e.g., wheelchair or bedside chair)?: A  Lot Help needed to walk in hospital room?: A Lot Help needed climbing 3-5 steps with a railing? : Total 6 Click Score: 11    End of Session Equipment Utilized During Treatment: Gait belt Activity Tolerance: Patient limited by fatigue;Patient limited by pain Patient left: in chair;with call bell/phone within reach;with chair alarm set Nurse Communication: Mobility status PT Visit Diagnosis: Other abnormalities of gait and mobility (R26.89);Repeated falls (R29.6);Muscle weakness (generalized) (M62.81)     Time: 7096-2836 PT Time Calculation (min) (ACUTE ONLY): 24 min  Charges:  $Gait Training: 8-22 mins $Therapeutic Exercise: 8-22 mins                     Rica Koyanagi  PTA Acute  Rehabilitation Services Pager      [phone] Office      [phone]

## 2019-12-24 NOTE — Progress Notes (Signed)
Physical Therapy Treatment Patient Details Name: Stephanie Alvarado MRN: [id number] DOB: 1941/01/12 Today's Date: 12/24/2019    History of Present Illness pt s/p LTKA 12/23/2019 with history of RTKA Aug 2020 , and back surgerys, and reports of more frequent and recent falls . Pt has been using a cane since her last surgery in 2020    PT Comments    POD # 1 pm session Pt OOB in recliner.  Assisted with amb an increased distance.  Then returned to room to perform some TE's following HEP handout.  Instructed on proper tech, freq as well as use of ICE.   Pt lives home alone and will need ST Rehab prior to safe D/C to home.   Follow Up Recommendations  Follow surgeon's recommendation for DC plan and follow-up therapies;SNF     Equipment Recommendations       Recommendations for Other Services       Precautions / Restrictions Precautions Precautions: Knee Precaution Comments: reviewed NO pillow left under knee Restrictions Weight Bearing Restrictions: No Other Position/Activity Restrictions: WBAT    Mobility  Bed Mobility Overal bed mobility: Needs Assistance Bed Mobility: Supine to Sit     Supine to sit: Min assist     General bed mobility comments: OOB in recliner  Transfers Overall transfer level: Needs assistance Equipment used: Rolling walker (2 wheeled) Transfers: Sit to/from Stand Sit to Stand: Mod assist;Min assist         General transfer comment: 25% VC's on proper hand placement and increased effort to rise from recliner.   Required increased effort.  Initial posterior lean.  Slightly unsteady.  Ambulation/Gait Ambulation/Gait assistance: Min assist;Mod assist Gait Distance (Feet): 32 Feet Assistive device: Rolling walker (2 wheeled) Gait Pattern/deviations: Step-to pattern;Decreased stance time - left;Trunk flexed Gait velocity: decreased   General Gait Details: 25% VC's on proper walker to self distance esp with turns.  Unsteady.  Poor forward flex  posture (Hx 3 back surgeries)   Stairs             Wheelchair Mobility    Modified Rankin (Stroke Patients Only)       Balance                                            Cognition Arousal/Alertness: Awake/alert Behavior During Therapy: WFL for tasks assessed/performed Overall Cognitive Status: Within Functional Limits for tasks assessed                                        Exercises   Total Knee Replacement TE's following HEP handout 10 reps B LE ankle pumps 05 reps towel squeezes 05 reps knee presses 05 reps heel slides  05 reps SAQ's 05 reps SLR's 05 reps ABD Educated on use of gait belt to assist with TE's Followed by ICE     General Comments        Pertinent Vitals/Pain Pain Assessment: 0-10 Pain Score: 5  Pain Location: L knee Pain Descriptors / Indicators: Grimacing;Operative site guarding;Tender Pain Intervention(s): Monitored during session;Premedicated before session;Repositioned;Ice applied    Home Living                      Prior Function  PT Goals (current goals can now be found in the care plan section)      Frequency    7X/week      PT Plan Current plan remains appropriate    Co-evaluation              AM-PAC PT "6 Clicks" Mobility   Outcome Measure  Help needed turning from your back to your side while in a flat bed without using bedrails?: A Lot Help needed moving from lying on your back to sitting on the side of a flat bed without using bedrails?: A Lot Help needed moving to and from a bed to a chair (including a wheelchair)?: A Lot Help needed standing up from a chair using your arms (e.g., wheelchair or bedside chair)?: A Lot Help needed to walk in hospital room?: A Lot Help needed climbing 3-5 steps with a railing? : Total 6 Click Score: 11    End of Session Equipment Utilized During Treatment: Gait belt Activity Tolerance: Patient limited by  fatigue;Patient limited by pain Patient left: in chair;with call bell/phone within reach;with chair alarm set Nurse Communication: Mobility status PT Visit Diagnosis: Other abnormalities of gait and mobility (R26.89);Repeated falls (R29.6);Muscle weakness (generalized) (M62.81)     Time: 1445-1510 PT Time Calculation (min) (ACUTE ONLY): 25 min  Charges:  $Gait Training: 8-22 mins $Therapeutic Exercise: 8-22 mins                     {Raeli Wiens  PTA Acute  Rehabilitation Services Pager      [phone] Office      [phone]

## 2019-12-24 NOTE — Progress Notes (Signed)
Subjective: 1 Day Post-Op Procedure(s) (LRB): TOTAL KNEE ARTHROPLASTY (Left) Patient reports pain as moderate.   Patient seen in rounds by Dr. Wynelle Link. Patient is well, and has had no acute complaints or problems other than pain in the left knee. Denies chest pain, SOB, or calf pain. No issues overnight. Foley catheter to be removed this AM. We will continue therapy today.   Objective: Vital signs in last 24 hours: Temp:  [96 F (35.6 C)-98.7 F (37.1 C)] 97.6 F (36.4 C) (07/13 0547) Pulse Rate:  [52-68] 67 (07/13 0547) Resp:  [10-22] 15 (07/13 0547) BP: (100-147)/(56-71) 122/70 (07/13 0547) SpO2:  [88 %-100 %] 96 % (07/13 0547) Weight:  [91.3 kg] 91.3 kg (07/12 1411)  Intake/Output from previous day:  Intake/Output Summary (Last 24 hours) at 12/24/2019 0710 Last data filed at 12/24/2019 0600 Gross per 24 hour  Intake 3582.3 ml  Output 3800 ml  Net -217.7 ml     Intake/Output this shift: No intake/output data recorded.  Labs: Recent Labs    12/24/19 0256  HGB 11.5*   Recent Labs    12/24/19 0256  WBC 11.9*  RBC 4.08  HCT 36.6  PLT 144*   Recent Labs    12/24/19 0256  NA 139  K 4.4  CL 103  CO2 26  BUN 10  CREATININE 0.75  GLUCOSE 142*  CALCIUM 9.4   No results for input(s): LABPT, INR in the last 72 hours.  Exam: General - Patient is Alert and Oriented Extremity - Neurologically intact Neurovascular intact Sensation intact distally Dorsiflexion/Plantar flexion intact Dressing - dressing C/D/I Motor Function - intact, moving foot and toes well on exam.   Past Medical History:  Diagnosis Date  . Achilles tendon rupture, right, initial encounter   . Allergic rhinitis, cause unspecified   . Anemia, unspecified   . Anxiety state, unspecified   . Atherosclerosis of aorta ([place])    TEE, June, 2010, grade 4 aortic arch atherosclerosis , Dr. Aundra Dubin  . CAD (coronary artery disease)    DES and circumflex 2006 /  DES to LAD 2011  . Carotid artery  disease ([place])    Doppler, November, 2011, stable, 0-39% bilateral, turbulent flow left subclavian  . Chronic diastolic heart failure ([place])   . Cyst of thyroid   . Diverticulosis of colon (without mention of hemorrhage)   . Dysphasia    Possibly esophageal stricture  . Ejection fraction     EF 60%, Echo, November 12, 2008, /  EF 55%, TEE, November 17, 2008  . Esophageal reflux   . Hiatal hernia   . Hyperlipemia   . Hypertension   . Lumbago   . Lumbar disc disease   . MVP (mitral valve prolapse)   . Neuropathy   . Nonspecific abnormal finding in stool contents   . Obesity, unspecified   . Osteoarthrosis, unspecified whether generalized or localized, unspecified site   . Osteopenia   . Other diseases of lung, not elsewhere classified   . Palpitations    October, 2012  . Peripheral vascular disease, unspecified ([place])   . Personal history of colonic polyps    ADENOMATOUS POLYP  . PFO (patent foramen ovale)    Patient had a very small PFO by TEE 2010.  This was seen only by bubble analysis during Valsalva  . PONV (postoperative nausea and vomiting)   . Shortness of breath   . Sleep apnea    uses CPAP nightly  . Sleep related hypoventilation/hypoxemia in conditions  classifiable elsewhere   . Stroke [facility]) 11/2008   Treated with TPA? /     2010, TEE no left atrial clot, probably from atherosclerosis of the aortic arch  . Subclavian artery disease [facility])    Remote surgery by Dr. Victorino Dike.  Marland Kitchen Unspecified cerebral artery occlusion with cerebral infarction   . Unspecified venous (peripheral) insufficiency   . Vitamin B 12 deficiency     Assessment/Plan: 1 Day Post-Op Procedure(s) (LRB): TOTAL KNEE ARTHROPLASTY (Left) Active Problems:   Primary osteoarthritis of left knee  Estimated body mass index is 31.52 kg/m as calculated from the following:   Height as of this encounter: 5\' 7"  (1.702 m).   Weight as of this encounter: 91.3 kg. Advance diet Up with therapy  Anticipated LOS equal to  or greater than 2 midnights due to - Age 81 and older with one or more of the following:  - Obesity  - Expected need for hospital services (PT, OT, Nursing) required for safe  discharge  - Anticipated need for postoperative skilled nursing care or inpatient rehab  - Active co-morbidities: Coronary Artery Disease and Stroke OR   - Unanticipated findings during/Post Surgery: None  - Patient is a high risk of re-admission due to: Barriers to post-acute care (logistical, no family support in home)    DVT Prophylaxis - Xarelto Weight bearing as tolerated. Continue therapy.  Plan is to go to Skilled nursing facility after hospital stay (lives at home alone). Discharge tomorrow if bed available and arrangements have been finalized.   Theresa Duty, PA-C Orthopedic Surgery [phone] 12/24/2019, 7:10 AM

## 2019-12-24 NOTE — Plan of Care (Signed)
  Problem: Education: Goal: Knowledge of General Education information will improve Description: Including pain rating scale, medication(s)/side effects and non-pharmacologic comfort measures Outcome: Progressing   Problem: Activity: Goal: Risk for activity intolerance will decrease Outcome: Progressing   Problem: Nutrition: Goal: Adequate nutrition will be maintained Outcome: Progressing   Problem: Elimination: Goal: Will not experience complications related to urinary retention Outcome: Progressing   Problem: Pain Managment: Goal: General experience of comfort will improve Outcome: Progressing   

## 2019-12-24 NOTE — TOC Initial Note (Signed)
Transition of Care [facility]) - Initial/Assessment Note    Patient Details  Name: Stephanie Alvarado MRN: [id number] Date of Birth: [birth year]  Transition of Care [facility]) CM/SW Contact:    Servando Snare, LCSW Phone Number: 12/24/2019, 10:07 AM  Clinical Narrative:  Patient from home alone. Uses can to ambulate. Patient reports they still drive. Limited cooking and cleaning independently. Patient agreeable to SNF. No family at bedside.   PLAN: SNF  [place] CSW [phone]                  Expected Discharge Plan: Skilled Nursing Facility Barriers to Discharge: Continued Medical Work up   Patient Goals and CMS Choice Patient states their goals for this hospitalization and ongoing recovery are:: Go to rehab to get stronger. CMS Medicare.gov Compare Post Acute Care list provided to:: Patient Choice offered to / list presented to : Patient  Expected Discharge Plan and Services Expected Discharge Plan: [place] In-house Referral: NA Discharge Planning Services: NA Post Acute Care Choice: [place] Living arrangements for the past 2 months: Single Family Home                 DME Arranged: N/A DME Agency: NA       HH Arranged: NA HH Agency: NA        Prior Living Arrangements/Services Living arrangements for the past 2 months: Single Family Home Lives with:: Self Patient language and need for interpreter reviewed:: Yes Do you feel safe going back to the place where you live?: Yes      Need for Family Participation in Patient Care: Yes (Comment) Care giver support system in place?: No (comment) Current home services: DME Criminal Activity/Legal Involvement Pertinent to Current Situation/Hospitalization: No - Comment as needed  Activities of Daily Living Home Assistive Devices/Equipment: Cane (specify quad or straight), Eyeglasses, Bedside commode/3-in-1, Grab bars around toilet, Shower chair with back ADL Screening  (condition at time of admission) Patient's cognitive ability adequate to safely complete daily activities?: Yes Is the patient deaf or have difficulty hearing?: No Does the patient have difficulty seeing, even when wearing glasses/contacts?: No Does the patient have difficulty concentrating, remembering, or making decisions?: No Patient able to express need for assistance with ADLs?: Yes Does the patient have difficulty dressing or bathing?: No Independently performs ADLs?: Yes (appropriate for developmental age) Does the patient have difficulty walking or climbing stairs?: Yes Weakness of Legs: Both Weakness of Arms/Hands: Both  Permission Sought/Granted Permission sought to share information with : Facility Sport and exercise psychologist, Family Supports Permission granted to share information with : Yes, Verbal Permission Granted  Share Information with NAME: Jenny Reichmann and Octavia Bruckner     Permission granted to share info w Relationship: SO and Son     Emotional Assessment Appearance:: Appears stated age Attitude/Demeanor/Rapport: Engaged Affect (typically observed): Pleasant Orientation: : Oriented to Self, Oriented to Place, Oriented to  Time, Oriented to Situation Alcohol / Substance Use: Not Applicable Psych Involvement: No (comment)  Admission diagnosis:  Primary osteoarthritis of left knee [M17.12] Patient Active Problem List   Diagnosis Date Noted  . Primary osteoarthritis of left knee 12/23/2019  . Diarrhea 09/06/2017  . RLQ abdominal pain 05/26/2017  . Lightheadedness   . Ataxia 05/30/2016  . Dizziness 05/30/2016  . Preoperative clearance 11/10/2015  . Varicose veins of left lower extremity with complications 03/29/5101  . Varicose veins of leg with complications 58/52/7782  . Lumbar scoliosis 11/21/2013  . Preop cardiovascular exam 10/29/2013  .  LLQ abdominal pain 04/05/2013  . Diverticulitis 04/05/2013  . Palpitations   . 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 PFO (patent foramen  ovale)   . CAD (coronary artery disease)   . Ejection fraction   . Atherosclerosis of aorta ([place])   . Carotid artery disease ([place])   . 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 CONSTIPATION 06/11/2010  . ABDOMINAL PAIN-LLQ 06/11/2010  . History of colonic polyps 06/11/2010  . SLEEP RELATED HYPOVENTILATION/HYPOXEMIA CCE 07/17/2009  . Stroke ([place]) 11/11/2008  . COLONIC POLYPS 08/31/2008  . THYROID CYST 08/31/2008  . VENOUS INSUFFICIENCY 08/31/2008  . DIVERTICULOSIS OF COLON 08/31/2008  . LOW BACK PAIN, CHRONIC 08/31/2008  . 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 Dyspnea 08/08/2008  . HYPERCHOLESTEROLEMIA 04/19/2007  . OBESITY 04/19/2007  . ANEMIA 04/19/2007  . ANXIETY 04/19/2007  . Essential hypertension 04/19/2007  . ALLERGIC RHINITIS 04/19/2007  . PULMONARY NODULE, RIGHT LOWER LOBE 04/19/2007  . GERD 04/19/2007  . OA (osteoarthritis) of knee 04/19/2007   PCP:  Caryl Bis, MD Pharmacy:   [facility] [phone] [address], [address] AT [facility] OF [address] [place] [address] Phone: [phone] Fax: [phone]     Social Determinants of Health ([place]) Interventions    Readmission Risk Interventions Readmission Risk Prevention Plan 12/24/2019  Transportation Screening Complete  Some recent data might be hidden

## 2019-12-25 DIAGNOSIS — M25562 Pain in left knee: Secondary | ICD-10-CM | POA: Diagnosis not present

## 2019-12-25 DIAGNOSIS — Z471 Aftercare following joint replacement surgery: Secondary | ICD-10-CM | POA: Diagnosis not present

## 2019-12-25 DIAGNOSIS — I10 Essential (primary) hypertension: Secondary | ICD-10-CM | POA: Diagnosis not present

## 2019-12-25 DIAGNOSIS — Z7409 Other reduced mobility: Secondary | ICD-10-CM | POA: Diagnosis not present

## 2019-12-25 DIAGNOSIS — Z95818 Presence of other cardiac implants and grafts: Secondary | ICD-10-CM | POA: Diagnosis not present

## 2019-12-25 DIAGNOSIS — R600 Localized edema: Secondary | ICD-10-CM | POA: Diagnosis not present

## 2019-12-25 DIAGNOSIS — Z96651 Presence of right artificial knee joint: Secondary | ICD-10-CM | POA: Diagnosis not present

## 2019-12-25 DIAGNOSIS — Z96652 Presence of left artificial knee joint: Secondary | ICD-10-CM | POA: Diagnosis not present

## 2019-12-25 DIAGNOSIS — F339 Major depressive disorder, recurrent, unspecified: Secondary | ICD-10-CM | POA: Diagnosis not present

## 2019-12-25 DIAGNOSIS — I872 Venous insufficiency (chronic) (peripheral): Secondary | ICD-10-CM | POA: Diagnosis not present

## 2019-12-25 DIAGNOSIS — R4182 Altered mental status, unspecified: Secondary | ICD-10-CM | POA: Diagnosis not present

## 2019-12-25 DIAGNOSIS — G629 Polyneuropathy, unspecified: Secondary | ICD-10-CM | POA: Diagnosis not present

## 2019-12-25 DIAGNOSIS — M797 Fibromyalgia: Secondary | ICD-10-CM | POA: Diagnosis not present

## 2019-12-25 DIAGNOSIS — G4733 Obstructive sleep apnea (adult) (pediatric): Secondary | ICD-10-CM | POA: Diagnosis not present

## 2019-12-25 DIAGNOSIS — M25569 Pain in unspecified knee: Secondary | ICD-10-CM | POA: Diagnosis not present

## 2019-12-25 DIAGNOSIS — R2689 Other abnormalities of gait and mobility: Secondary | ICD-10-CM | POA: Diagnosis not present

## 2019-12-25 DIAGNOSIS — R2681 Unsteadiness on feet: Secondary | ICD-10-CM | POA: Diagnosis not present

## 2019-12-25 DIAGNOSIS — K219 Gastro-esophageal reflux disease without esophagitis: Secondary | ICD-10-CM | POA: Diagnosis not present

## 2019-12-25 DIAGNOSIS — R609 Edema, unspecified: Secondary | ICD-10-CM | POA: Diagnosis not present

## 2019-12-25 DIAGNOSIS — L03116 Cellulitis of left lower limb: Secondary | ICD-10-CM | POA: Diagnosis not present

## 2019-12-25 DIAGNOSIS — F4320 Adjustment disorder, unspecified: Secondary | ICD-10-CM | POA: Diagnosis not present

## 2019-12-25 DIAGNOSIS — F419 Anxiety disorder, unspecified: Secondary | ICD-10-CM | POA: Diagnosis not present

## 2019-12-25 DIAGNOSIS — Z9181 History of falling: Secondary | ICD-10-CM | POA: Diagnosis not present

## 2019-12-25 DIAGNOSIS — I2510 Atherosclerotic heart disease of native coronary artery without angina pectoris: Secondary | ICD-10-CM | POA: Diagnosis not present

## 2019-12-25 DIAGNOSIS — M6281 Muscle weakness (generalized): Secondary | ICD-10-CM | POA: Diagnosis not present

## 2019-12-25 DIAGNOSIS — Z96659 Presence of unspecified artificial knee joint: Secondary | ICD-10-CM | POA: Diagnosis not present

## 2019-12-25 DIAGNOSIS — M1712 Unilateral primary osteoarthritis, left knee: Secondary | ICD-10-CM | POA: Diagnosis not present

## 2019-12-25 DIAGNOSIS — Z789 Other specified health status: Secondary | ICD-10-CM | POA: Diagnosis not present

## 2019-12-25 DIAGNOSIS — Z4789 Encounter for other orthopedic aftercare: Secondary | ICD-10-CM | POA: Diagnosis not present

## 2019-12-25 DIAGNOSIS — G4734 Idiopathic sleep related nonobstructive alveolar hypoventilation: Secondary | ICD-10-CM | POA: Diagnosis not present

## 2019-12-25 LAB — BASIC METABOLIC PANEL
Anion gap: 6 (ref 5–15)
BUN: 19 mg/dL (ref 8–23)
CO2: 26 mmol/L (ref 22–32)
Calcium: 8.9 mg/dL (ref 8.9–10.3)
Chloride: 105 mmol/L (ref 98–111)
Creatinine, Ser: 0.84 mg/dL (ref 0.44–1.00)
GFR calc Af Amer: >60 mL/min (ref >60)
GFR calc non Af Amer: >60 mL/min (ref >60)
Glucose, Bld: 129 mg/dL — ABNORMAL HIGH (ref 70–99)
Potassium: 4.2 mmol/L (ref 3.5–5.1)
Sodium: 137 mmol/L (ref 135–145)

## 2019-12-25 LAB — CBC
HCT: 32.3 % — ABNORMAL LOW (ref 36.0–46.0)
Hemoglobin: 9.9 g/dL — ABNORMAL LOW (ref 12.0–15.0)
MCH: 28 pg (ref 26.0–34.0)
MCHC: 30.7 g/dL (ref 30.0–36.0)
MCV: 91.5 fL (ref 80.0–100.0)
Platelets: 138 10*3/uL — ABNORMAL LOW (ref 150–400)
RBC: 3.53 MIL/uL — ABNORMAL LOW (ref 3.87–5.11)
RDW: 17.2 % — ABNORMAL HIGH (ref 11.5–15.5)
WBC: 14.9 10*3/uL — ABNORMAL HIGH (ref 4.0–10.5)
nRBC: 0 % (ref 0.0–0.2)

## 2019-12-25 MED ORDER — METHOCARBAMOL 500 MG PO TABS: 500.0000 mg | ORAL_TABLET | Freq: Four times a day (QID) | ORAL | 0 refills | Status: DC | PRN

## 2019-12-25 MED ORDER — OXYCODONE HCL 5 MG PO TABS: 5.0000 mg | ORAL_TABLET | Freq: Four times a day (QID) | ORAL | 0 refills | Status: DC | PRN

## 2019-12-25 MED ORDER — BISACODYL 10 MG RE SUPP: 10.0000 mg | Freq: Every day | RECTAL | 0 refills | Status: DC | PRN

## 2019-12-25 MED ORDER — ONDANSETRON HCL 4 MG PO TABS: 4.0000 mg | ORAL_TABLET | Freq: Four times a day (QID) | ORAL | 0 refills | Status: DC | PRN

## 2019-12-25 MED ORDER — RIVAROXABAN 10 MG PO TABS: 10.0000 mg | ORAL_TABLET | Freq: Every day | ORAL | 0 refills | Status: DC

## 2019-12-25 NOTE — Discharge Summary (Signed)
Physician Discharge Summary   Patient ID: Stephanie Alvarado MRN: [id number] DOB/AGE: 1940/10/03 79 y.o.  Admit date: 12/23/2019 Discharge date: 12/25/2019  Primary Diagnosis: Primary osteoarthritis of the left knee   Admission Diagnoses:  Past Medical History:  Diagnosis Date  . Achilles tendon rupture, right, initial encounter   . Allergic rhinitis, cause unspecified   . Anemia, unspecified   . Anxiety state, unspecified   . Atherosclerosis of aorta ([place])    TEE, June, 2010, grade 4 aortic arch atherosclerosis , Dr. Aundra Dubin  . CAD (coronary artery disease)    DES and circumflex 2006 /  DES to LAD 2011  . Carotid artery disease ([place])    Doppler, November, 2011, stable, 0-39% bilateral, turbulent flow left subclavian  . Chronic diastolic heart failure ([place])   . Cyst of thyroid   . Diverticulosis of colon (without mention of hemorrhage)   . Dysphasia    Possibly esophageal stricture  . Ejection fraction     EF 60%, Echo, November 12, 2008, /  EF 55%, TEE, November 17, 2008  . Esophageal reflux   . Hiatal hernia   . Hyperlipemia   . Hypertension   . Lumbago   . Lumbar disc disease   . MVP (mitral valve prolapse)   . Neuropathy   . Nonspecific abnormal finding in stool contents   . Obesity, unspecified   . Osteoarthrosis, unspecified whether generalized or localized, unspecified site   . Osteopenia   . Other diseases of lung, not elsewhere classified   . Palpitations    October, 2012  . Peripheral vascular disease, unspecified ([place])   . Personal history of colonic polyps    ADENOMATOUS POLYP  . PFO (patent foramen ovale)    Patient had a very small PFO by TEE 2010.  This was seen only by bubble analysis during Valsalva  . PONV (postoperative nausea and vomiting)   . Shortness of breath   . Sleep apnea    uses CPAP nightly  . Sleep related hypoventilation/hypoxemia in conditions classifiable elsewhere   . Stroke [facility]) 11/2008   Treated with TPA? /     2010, TEE no left atrial clot,  probably from atherosclerosis of the aortic arch  . Subclavian artery disease [facility])    Remote surgery by Dr. Victorino Dike.  Marland Kitchen Unspecified cerebral artery occlusion with cerebral infarction   . Unspecified venous (peripheral) insufficiency   . Vitamin B 12 deficiency    Discharge Diagnoses:   Active Problems:   Primary osteoarthritis of left knee  Estimated body mass index is 31.52 kg/m as calculated from the following:   Height as of this encounter: 5\' 7"  (1.702 m).   Weight as of this encounter: 91.3 kg.  Procedure:  Procedure(s) (LRB): TOTAL KNEE ARTHROPLASTY (Left)   Consults: None  HPI: CLARETTA Alvarado is a 79 y.o. year old female with end stage OA of her left knee with progressively worsening pain and dysfunction. She has constant pain, with activity and at rest and significant functional deficits with difficulties even with ADLs. She has had extensive non-op management including analgesics, injections of cortisone and viscosupplements, and home exercise program, but remains in significant pain with significant dysfunction. Radiographs show bone on bone arthritis medial and patellofemoral. She presents now for left Total Knee Arthroplasty.    Laboratory Data: Admission on 12/23/2019  Component Date Value Ref Range Status  . WBC 12/24/2019 11.9* 4.0 - 10.5 K/uL Final  . RBC 12/24/2019 4.08  3.87 - 5.11  MIL/uL Final  . Hemoglobin 12/24/2019 11.5* 12.0 - 15.0 g/dL Final  . HCT 12/24/2019 36.6  36 - 46 % Final  . MCV 12/24/2019 89.7  80.0 - 100.0 fL Final  . MCH 12/24/2019 28.2  26.0 - 34.0 pg Final  . MCHC 12/24/2019 31.4  30.0 - 36.0 g/dL Final  . RDW 12/24/2019 16.8* 11.5 - 15.5 % Final  . Platelets 12/24/2019 144* 150 - 400 K/uL Final  . nRBC 12/24/2019 0.0  0.0 - 0.2 % Final   Performed at [facility], [place] [address] [place], [place] [address]  . Sodium 12/24/2019 139  135 - 145 mmol/L Final  . Potassium 12/24/2019 4.4  3.5 - 5.1 mmol/L Final  . Chloride  12/24/2019 103  98 - 111 mmol/L Final  . CO2 12/24/2019 26  22 - 32 mmol/L Final  . Glucose, Bld 12/24/2019 142* 70 - 99 mg/dL Final   Glucose reference range applies only to samples taken after fasting for at least 8 hours.  . BUN 12/24/2019 10  8 - 23 mg/dL Final  . Creatinine, Ser 12/24/2019 0.75  0.44 - 1.00 mg/dL Final  . Calcium 12/24/2019 9.4  8.9 - 10.3 mg/dL Final  . GFR calc non Af Amer 12/24/2019 >60  >60 mL/min Final  . GFR calc Af Amer 12/24/2019 >60  >60 mL/min Final  . Anion gap 12/24/2019 10  5 - 15 Final   Performed at [facility], [place] [address], [place], [place] [address]  . WBC 12/25/2019 14.9* 4.0 - 10.5 K/uL Final  . RBC 12/25/2019 3.53* 3.87 - 5.11 MIL/uL Final  . Hemoglobin 12/25/2019 9.9* 12.0 - 15.0 g/dL Final  . HCT 12/25/2019 32.3* 36 - 46 % Final  . MCV 12/25/2019 91.5  80.0 - 100.0 fL Final  . MCH 12/25/2019 28.0  26.0 - 34.0 pg Final  . MCHC 12/25/2019 30.7  30.0 - 36.0 g/dL Final  . RDW 12/25/2019 17.2* 11.5 - 15.5 % Final  . Platelets 12/25/2019 138* 150 - 400 K/uL Final  . nRBC 12/25/2019 0.0  0.0 - 0.2 % Final   Performed at [facility], [place] [address] [place], [place] [address]  . Sodium 12/25/2019 137  135 - 145 mmol/L Final  . Potassium 12/25/2019 4.2  3.5 - 5.1 mmol/L Final  . Chloride 12/25/2019 105  98 - 111 mmol/L Final  . CO2 12/25/2019 26  22 - 32 mmol/L Final  . Glucose, Bld 12/25/2019 129* 70 - 99 mg/dL Final   Glucose reference range applies only to samples taken after fasting for at least 8 hours.  . BUN 12/25/2019 19  8 - 23 mg/dL Final  . Creatinine, Ser 12/25/2019 0.84  0.44 - 1.00 mg/dL Final  . Calcium 12/25/2019 8.9  8.9 - 10.3 mg/dL Final  . GFR calc non Af Amer 12/25/2019 >60  >60 mL/min Final  . GFR calc Af Amer 12/25/2019 >60  >60 mL/min Final  . Anion gap 12/25/2019 6  5 - 15 Final   Performed at [facility], [place] [address] [place], [place] [address]  Hospital  Outpatient Visit on 12/19/2019  Component Date Value Ref Range Status  . SARS Coronavirus 2 12/19/2019 NEGATIVE  NEGATIVE Final   Comment: (NOTE) SARS-CoV-2 target nucleic acids are NOT DETECTED.  The SARS-CoV-2 RNA is generally detectable in upper and lower respiratory specimens during the acute phase of infection. Negative results do not preclude SARS-CoV-2 infection, do not rule  out co-infections with other pathogens, and should not be used as the sole basis for treatment or other patient management decisions. Negative results must be combined with clinical observations, patient history, and epidemiological information. The expected result is Negative.  Fact Sheet for Patients: SugarRoll.be  Fact Sheet for Healthcare Providers: https://www.woods-mathews.com/  This test is not yet approved or cleared by the [place] FDA and  has been authorized for detection and/or diagnosis of SARS-CoV-2 by FDA under an Emergency Use Authorization (EUA). This EUA will remain  in effect (meaning this test can be used) for the duration of the COVID-19 declaration under Se                          ction 564(b)(1) of the Act, 21 U.S.C. section 360bbb-3(b)(1), unless the authorization is terminated or revoked sooner.  Performed at [facility], [place] [address] [place], [place] [address]   Hospital Outpatient Visit on 12/11/2019  Component Date Value Ref Range Status  . aPTT 12/11/2019 28  24 - 36 seconds Final   Performed at [facility], [place] [address] [place], [place] [address]  . WBC 12/11/2019 6.4  4.0 - 10.5 K/uL Final  . RBC 12/11/2019 4.73  3.87 - 5.11 MIL/uL Final  . Hemoglobin 12/11/2019 13.3  12.0 - 15.0 g/dL Final  . HCT 12/11/2019 44.2  36 - 46 % Final  . MCV 12/11/2019 93.4  80.0 - 100.0 fL Final  . MCH 12/11/2019 28.1  26.0 - 34.0 pg Final  . MCHC 12/11/2019 30.1  30.0 - 36.0 g/dL Final  . RDW 12/11/2019  17.1* 11.5 - 15.5 % Final  . Platelets 12/11/2019 164  150 - 400 K/uL Final  . nRBC 12/11/2019 0.0  0.0 - 0.2 % Final   Performed at [facility], [place] [address] [place], [place] [address]  . Sodium 12/11/2019 140  135 - 145 mmol/L Final  . Potassium 12/11/2019 4.7  3.5 - 5.1 mmol/L Final  . Chloride 12/11/2019 105  98 - 111 mmol/L Final  . CO2 12/11/2019 27  22 - 32 mmol/L Final  . Glucose, Bld 12/11/2019 149* 70 - 99 mg/dL Final   Glucose reference range applies only to samples taken after fasting for at least 8 hours.  . BUN 12/11/2019 16  8 - 23 mg/dL Final  . Creatinine, Ser 12/11/2019 0.90  0.44 - 1.00 mg/dL Final  . Calcium 12/11/2019 9.6  8.9 - 10.3 mg/dL Final  . Total Protein 12/11/2019 7.1  6.5 - 8.1 g/dL Final  . Albumin 12/11/2019 4.0  3.5 - 5.0 g/dL Final  . AST 12/11/2019 16  15 - 41 U/L Final  . ALT 12/11/2019 13  0 - 44 U/L Final  . Alkaline Phosphatase 12/11/2019 101  38 - 126 U/L Final  . Total Bilirubin 12/11/2019 0.4  0.3 - 1.2 mg/dL Final  . GFR calc non Af Amer 12/11/2019 >60  >60 mL/min Final  . GFR calc Af Amer 12/11/2019 >60  >60 mL/min Final  . Anion gap 12/11/2019 8  5 - 15 Final   Performed at [facility], [place] [address] [place], [place] [address]  . Prothrombin Time 12/11/2019 13.1  11.4 - 15.2 seconds Final  . INR 12/11/2019 1.0  0.8 - 1.2 Final   Comment: (NOTE) INR goal varies based on device and disease states. Performed at [facility], [place] [address] [place],  [place] [address]   . ABO/RH(D) 12/11/2019 O POS   Final  . Antibody Screen 12/11/2019 NEG   Final  . Sample Expiration 12/11/2019 12/25/2019,2359   Final  . Extend sample reason 12/11/2019    Final                   Value:NO TRANSFUSIONS OR PREGNANCY IN THE PAST 3 MONTHS Performed at [place] [address], [place], [place] [address]   Hospital Outpatient Visit on 12/10/2019  Component Date Value Ref  Range Status  . MRSA, PCR 12/10/2019 NEGATIVE  NEGATIVE Final  . Staphylococcus aureus 12/10/2019 NEGATIVE  NEGATIVE Final   Comment: (NOTE) The Xpert SA Assay (FDA approved for NASAL specimens in patients 71 years of age and older), is one component of a comprehensive surveillance program. It is not intended to diagnose infection nor to guide or monitor treatment. Performed at [facility], [place] [address], [place], [place] [address]      X-Rays:No results found.  EKG: Orders placed or performed in visit on 03/07/19  . EKG 12-Lead     Hospital Course: Stephanie Alvarado is a 49 y.o. who was admitted to [facility]. They were brought to the operating room on 12/23/2019 and underwent Procedure(s): TOTAL KNEE ARTHROPLASTY.  Patient tolerated the procedure well and was later transferred to the recovery room and then to the orthopaedic floor for postoperative care. They were given PO and IV analgesics for pain control following their surgery. They were given 24 hours of postoperative antibiotics of  Anti-infectives (From admission, onward)   Start     Dose/Rate Route Frequency Ordered Stop   12/23/19 1630  ceFAZolin (ANCEF) IVPB 2g/100 mL premix        2 g 200 mL/hr over 30 Minutes Intravenous Every 6 hours 12/23/19 1420 12/23/19 2214   12/23/19 0815  ceFAZolin (ANCEF) IVPB 2g/100 mL premix        2 g 200 mL/hr over 30 Minutes Intravenous On call to O.R. 12/23/19 0272 12/23/19 1047     and started on DVT prophylaxis in the form of Xarelto.   PT and OT were ordered for total joint protocol. Discharge planning consulted to help with postop disposition and equipment needs. Patient had a good night on the evening of surgery. They started to get up OOB with therapy on POD #0. Continued to work with therapy into POD #2. Pt was seen during rounds on day two and was ready to go home pending progress with therapy. Dressing was changed and the incision was clean, dry, and intact  with no drainage. Arrangements were made for patient by social worker for discharge to SNF. Patient lives at home alone and does not have the resources or help for a safe recovery. She worked with therapy on day two, and was later discharged to SNF in stable condition.  Diet: Cardiac diet Activity: WBAT Follow-up: in 2 weeks Disposition: Skilled nursing facility Discharged Condition: stable   Discharge Instructions    Call MD / Call 911   Complete by: As directed    If you experience chest pain or shortness of breath, CALL 911 and be transported to the hospital emergency room.  If you develope a fever above 101 F, pus (white drainage) or increased drainage or redness at the wound, or calf pain, call your surgeon's office.   Change dressing   Complete by: As directed    You may remove the bulky bandage (  ACE wrap and gauze) two days after surgery. You will have an adhesive waterproof bandage underneath. Leave this in place until your first follow-up appointment.   Constipation Prevention   Complete by: As directed    Drink plenty of fluids.  Prune juice may be helpful.  You may use a stool softener, such as Colace (over the counter) 100 mg twice a day.  Use MiraLax (over the counter) for constipation as needed.   Diet - low sodium heart healthy   Complete by: As directed    Do not put a pillow under the knee. Place it under the heel.   Complete by: As directed    Driving restrictions   Complete by: As directed    No driving for two weeks   TED hose   Complete by: As directed    Use stockings (TED hose) for three weeks on both leg(s).  You may remove them at night for sleeping.   Weight bearing as tolerated   Complete by: As directed      Allergies as of 12/25/2019      Reactions   Codeine Itching   REACTION: itch      Medication List    STOP taking these medications   aspirin EC 81 MG tablet   Prevagen 10 MG Caps Generic drug: Apoaequorin     TAKE these medications     amLODipine 5 MG tablet Commonly known as: NORVASC Take 5 mg by mouth every morning.   Artificial Tears 1.4 % ophthalmic solution Generic drug: polyvinyl alcohol Place 1 drop into both eyes as needed for dry eyes.   bisacodyl 10 MG suppository Commonly known as: DULCOLAX Place 1 suppository (10 mg total) rectally daily as needed for moderate constipation.   buPROPion 150 MG 24 hr tablet Commonly known as: WELLBUTRIN XL Take 150 mg by mouth in the morning and at bedtime.   docusate sodium 100 MG capsule Commonly known as: COLACE Take 100 mg by mouth daily as needed for mild constipation.   furosemide 40 MG tablet Commonly known as: LASIX Take 40 mg by mouth daily.   gabapentin 300 MG capsule Commonly known as: NEURONTIN Take 600 mg by mouth 3 (three) times daily.   isosorbide mononitrate 30 MG 24 hr tablet Commonly known as: IMDUR Take 30 mg by mouth at bedtime.   methocarbamol 500 MG tablet Commonly known as: ROBAXIN Take 1 tablet (500 mg total) by mouth every 6 (six) hours as needed for muscle spasms.   metoprolol tartrate 50 MG tablet Commonly known as: LOPRESSOR Take 50 mg by mouth 2 (two) times daily.   nitroGLYCERIN 0.4 MG SL tablet Commonly known as: NITROSTAT Place 0.4 mg under the tongue every 5 (five) minutes as needed for chest pain (TAKE UP TO 3 DOSES BEFORE CALLING 911).   ondansetron 4 MG tablet Commonly known as: ZOFRAN Take 1 tablet (4 mg total) by mouth every 6 (six) hours as needed for nausea.   oxyCODONE 5 MG immediate release tablet Commonly known as: Oxy IR/ROXICODONE Take 1-2 tablets (5-10 mg total) by mouth every 6 (six) hours as needed for severe pain.   pantoprazole 40 MG tablet Commonly known as: PROTONIX Take 40 mg by mouth 2 (two) times daily.   polyethylene glycol powder 17 GM/SCOOP powder Commonly known as: GLYCOLAX/MIRALAX Take 17 g by mouth daily as needed for moderate constipation.   rivaroxaban 10 MG Tabs tablet Commonly  known as: XARELTO Take 1 tablet (10 mg total) by mouth  daily with breakfast for 19 days. Then resume one 81 mg aspirin once a day.   rosuvastatin 10 MG tablet Commonly known as: CRESTOR Take 10 mg by mouth at bedtime.            Discharge Care Instructions  (From admission, onward)         Start     Ordered   12/25/19 0000  Weight bearing as tolerated        12/25/19 0753   12/25/19 0000  Change dressing       Comments: You may remove the bulky bandage (ACE wrap and gauze) two days after surgery. You will have an adhesive waterproof bandage underneath. Leave this in place until your first follow-up appointment.   12/25/19 0753          Follow-up Information    Gaynelle Arabian, MD. Schedule an appointment as soon as possible for a visit on 01/07/2020.   Specialty: Orthopedic Surgery Contact information: [address] [place] [address] [phone]               Signed: Theresa Duty, PA-C Orthopedic Surgery 12/25/2019, 7:54 AM

## 2019-12-25 NOTE — Progress Notes (Signed)
Attempted report CN at SNF, left voicemail and call back number.

## 2019-12-25 NOTE — TOC Transition Note (Addendum)
Transition of Care [facility]) - CM/SW Discharge Note   Patient Details  Name: Stephanie Alvarado MRN: [id number] Date of Birth: 20-May-1941  [facility]) CM/SW Contact:  Lia Hopping, [place] Phone Number: 12/25/2019, 1:28 PM   Clinical Narrative:    HTA approval Auth. Rehab. [phone] 7 days. PTAR transport authorization denied. CSW notified the patient,she will call her fiance to transport her to SNF.  [facility] ready to accept the patient. CSW notified the staff pt. Would arrive after 4:00pm today.  Nurse call report to: [phone] Room; 7001-a.  Final next level of care: Skilled Nursing Facility Barriers to Discharge: Barriers Resolved   Patient Goals and CMS Choice Patient states their goals for this hospitalization and ongoing recovery are:: Go to rehab to get stronger. CMS Medicare.gov Compare Post Acute Care list provided to:: Patient Choice offered to / list presented to : Patient  Discharge Placement PASRR number recieved: 12/24/19            Patient chooses bed at: [facility] Patient to be transferred to facility by: Fiance to transport by car/ HTA denied PTAR transport. Name of family member notified: Patient to notify her fiance Patient and family notified of of transfer: 12/25/19  Discharge Plan and Services In-house Referral: NA Discharge Planning Services: NA Post Acute Care Choice: [place]          DME Arranged: N/A DME Agency: NA       HH Arranged: NA HH Agency: NA        Social Determinants of Health ([place]) Interventions     Readmission Risk Interventions Readmission Risk Prevention Plan 12/24/2019  Transportation Screening Complete  Some recent data might be hidden

## 2019-12-25 NOTE — Progress Notes (Signed)
Physical Therapy Treatment Patient Details Name: Stephanie Alvarado MRN: [id number] DOB: October 02, 1940 Today's Date: 12/25/2019    History of Present Illness pt s/p LTKA 12/23/2019 with history of RTKA Aug 2020 , and back surgerys, and reports of more frequent and recent falls . Pt has been using a cane since her last surgery in 2020    PT Comments    POD # 2 am session Pt OOB in recliner.  Assisted with amb and Then returned to room to perform some TE's following HEP handout.  Instructed on proper tech, freq as well as use of ICE.   Pt plans to D/C to SNF for ST Rehab.   Follow Up Recommendations  Follow surgeon's recommendation for DC plan and follow-up therapies;SNF     Equipment Recommendations       Recommendations for Other Services       Precautions / Restrictions Precautions Precautions: Knee Precaution Comments: reviewed NO pillow left under knee Restrictions Weight Bearing Restrictions: No Other Position/Activity Restrictions: WBAT    Mobility  Bed Mobility               General bed mobility comments: OOB in recliner  Transfers Overall transfer level: Needs assistance Equipment used: Rolling walker (2 wheeled) Transfers: Sit to/from Stand Sit to Stand: Mod assist;Min assist         General transfer comment: 25% VC's on proper hand placement and increased effort to rise from recliner.   Required increased effort.  Initial posterior lean.  Slightly unsteady.  Ambulation/Gait Ambulation/Gait assistance: Min assist;Mod assist Gait Distance (Feet): 42 Feet Assistive device: Rolling walker (2 wheeled) Gait Pattern/deviations: Step-to pattern;Decreased stance time - left;Trunk flexed Gait velocity: decreased   General Gait Details: 25% VC's on proper walker to self distance esp with turns.  Unsteady.  Poor forward flex posture (Hx 3 back surgeries)   Stairs             Wheelchair Mobility    Modified Rankin (Stroke Patients Only)        Balance                                            Cognition Arousal/Alertness: Awake/alert   Overall Cognitive Status: Within Functional Limits for tasks assessed                                 General Comments: AxO x 3 very pleasant      Exercises      General Comments        Pertinent Vitals/Pain Pain Assessment: 0-10 Pain Score: 7  Pain Location: L knee Pain Descriptors / Indicators: Grimacing;Operative site guarding;Tender Pain Intervention(s): Monitored during session;Premedicated before session;Repositioned;Ice applied    Home Living                      Prior Function            PT Goals (current goals can now be found in the care plan section) Progress towards PT goals: Progressing toward goals    Frequency    7X/week      PT Plan Current plan remains appropriate    Co-evaluation              AM-PAC PT "6 Clicks" Mobility   Outcome Measure  Help needed turning from your back to your side while in a flat bed without using bedrails?: A Lot Help needed moving from lying on your back to sitting on the side of a flat bed without using bedrails?: A Lot Help needed moving to and from a bed to a chair (including a wheelchair)?: A Lot Help needed standing up from a chair using your arms (e.g., wheelchair or bedside chair)?: A Lot Help needed to walk in hospital room?: A Lot Help needed climbing 3-5 steps with a railing? : Total 6 Click Score: 11    End of Session Equipment Utilized During Treatment: Gait belt Activity Tolerance: Patient limited by fatigue;Patient limited by pain Patient left: in chair;with call bell/phone within reach;with chair alarm set Nurse Communication: Mobility status PT Visit Diagnosis: Other abnormalities of gait and mobility (R26.89);Repeated falls (R29.6);Muscle weakness (generalized) (M62.81)     Time: 1610-9604 PT Time Calculation (min) (ACUTE ONLY): 27 min  Charges:   $Gait Training: 8-22 mins $Therapeutic Exercise: 8-22 mins                     Rica Koyanagi  PTA Acute  Rehabilitation Services Pager      [phone] Office      [phone]

## 2019-12-26 DIAGNOSIS — Z96659 Presence of unspecified artificial knee joint: Secondary | ICD-10-CM | POA: Diagnosis not present

## 2019-12-26 DIAGNOSIS — Z7409 Other reduced mobility: Secondary | ICD-10-CM | POA: Diagnosis not present

## 2019-12-26 DIAGNOSIS — M25569 Pain in unspecified knee: Secondary | ICD-10-CM | POA: Diagnosis not present

## 2019-12-26 DIAGNOSIS — Z789 Other specified health status: Secondary | ICD-10-CM | POA: Diagnosis not present

## 2019-12-26 DIAGNOSIS — R4182 Altered mental status, unspecified: Secondary | ICD-10-CM | POA: Diagnosis not present

## 2019-12-27 DIAGNOSIS — Z471 Aftercare following joint replacement surgery: Secondary | ICD-10-CM | POA: Diagnosis not present

## 2019-12-27 DIAGNOSIS — I2510 Atherosclerotic heart disease of native coronary artery without angina pectoris: Secondary | ICD-10-CM | POA: Diagnosis not present

## 2019-12-27 DIAGNOSIS — R600 Localized edema: Secondary | ICD-10-CM | POA: Diagnosis not present

## 2019-12-27 DIAGNOSIS — Z96652 Presence of left artificial knee joint: Secondary | ICD-10-CM | POA: Diagnosis not present

## 2019-12-27 DIAGNOSIS — I10 Essential (primary) hypertension: Secondary | ICD-10-CM | POA: Diagnosis not present

## 2019-12-27 DIAGNOSIS — M1712 Unilateral primary osteoarthritis, left knee: Secondary | ICD-10-CM | POA: Diagnosis not present

## 2020-01-06 DIAGNOSIS — R609 Edema, unspecified: Secondary | ICD-10-CM | POA: Diagnosis not present

## 2020-01-06 DIAGNOSIS — M25562 Pain in left knee: Secondary | ICD-10-CM | POA: Diagnosis not present

## 2020-01-06 DIAGNOSIS — L03116 Cellulitis of left lower limb: Secondary | ICD-10-CM | POA: Diagnosis not present

## 2020-01-06 DIAGNOSIS — Z96652 Presence of left artificial knee joint: Secondary | ICD-10-CM | POA: Diagnosis not present

## 2020-01-08 DIAGNOSIS — Z471 Aftercare following joint replacement surgery: Secondary | ICD-10-CM | POA: Diagnosis not present

## 2020-01-08 DIAGNOSIS — Z96652 Presence of left artificial knee joint: Secondary | ICD-10-CM | POA: Diagnosis not present

## 2020-01-10 DIAGNOSIS — I129 Hypertensive chronic kidney disease with stage 1 through stage 4 chronic kidney disease, or unspecified chronic kidney disease: Secondary | ICD-10-CM | POA: Diagnosis not present

## 2020-01-10 DIAGNOSIS — N183 Chronic kidney disease, stage 3 unspecified: Secondary | ICD-10-CM | POA: Diagnosis not present

## 2020-01-10 DIAGNOSIS — Z87891 Personal history of nicotine dependence: Secondary | ICD-10-CM | POA: Diagnosis not present

## 2020-01-10 DIAGNOSIS — E7849 Other hyperlipidemia: Secondary | ICD-10-CM | POA: Diagnosis not present

## 2020-01-10 DIAGNOSIS — J441 Chronic obstructive pulmonary disease with (acute) exacerbation: Secondary | ICD-10-CM | POA: Diagnosis not present

## 2020-01-18 DIAGNOSIS — G4733 Obstructive sleep apnea (adult) (pediatric): Secondary | ICD-10-CM | POA: Diagnosis not present

## 2020-01-18 DIAGNOSIS — I83812 Varicose veins of left lower extremities with pain: Secondary | ICD-10-CM | POA: Diagnosis not present

## 2020-01-18 DIAGNOSIS — G629 Polyneuropathy, unspecified: Secondary | ICD-10-CM | POA: Diagnosis not present

## 2020-01-18 DIAGNOSIS — F419 Anxiety disorder, unspecified: Secondary | ICD-10-CM | POA: Diagnosis not present

## 2020-01-18 DIAGNOSIS — I2510 Atherosclerotic heart disease of native coronary artery without angina pectoris: Secondary | ICD-10-CM | POA: Diagnosis not present

## 2020-01-18 DIAGNOSIS — F329 Major depressive disorder, single episode, unspecified: Secondary | ICD-10-CM | POA: Diagnosis not present

## 2020-01-18 DIAGNOSIS — D649 Anemia, unspecified: Secondary | ICD-10-CM | POA: Diagnosis not present

## 2020-01-18 DIAGNOSIS — I872 Venous insufficiency (chronic) (peripheral): Secondary | ICD-10-CM | POA: Diagnosis not present

## 2020-01-18 DIAGNOSIS — I5032 Chronic diastolic (congestive) heart failure: Secondary | ICD-10-CM | POA: Diagnosis not present

## 2020-01-18 DIAGNOSIS — I739 Peripheral vascular disease, unspecified: Secondary | ICD-10-CM | POA: Diagnosis not present

## 2020-01-18 DIAGNOSIS — I110 Hypertensive heart disease with heart failure: Secondary | ICD-10-CM | POA: Diagnosis not present

## 2020-01-18 DIAGNOSIS — Z471 Aftercare following joint replacement surgery: Secondary | ICD-10-CM | POA: Diagnosis not present

## 2020-01-18 DIAGNOSIS — I700 Atherosclerosis of aorta: Secondary | ICD-10-CM | POA: Diagnosis not present

## 2020-01-28 DIAGNOSIS — Z96652 Presence of left artificial knee joint: Secondary | ICD-10-CM | POA: Diagnosis not present

## 2020-02-11 DIAGNOSIS — Z87891 Personal history of nicotine dependence: Secondary | ICD-10-CM | POA: Diagnosis not present

## 2020-02-11 DIAGNOSIS — M25562 Pain in left knee: Secondary | ICD-10-CM | POA: Diagnosis not present

## 2020-02-11 DIAGNOSIS — I129 Hypertensive chronic kidney disease with stage 1 through stage 4 chronic kidney disease, or unspecified chronic kidney disease: Secondary | ICD-10-CM | POA: Diagnosis not present

## 2020-02-11 DIAGNOSIS — J441 Chronic obstructive pulmonary disease with (acute) exacerbation: Secondary | ICD-10-CM | POA: Diagnosis not present

## 2020-02-11 DIAGNOSIS — N183 Chronic kidney disease, stage 3 unspecified: Secondary | ICD-10-CM | POA: Diagnosis not present

## 2020-02-14 DIAGNOSIS — M25562 Pain in left knee: Secondary | ICD-10-CM | POA: Diagnosis not present

## 2020-02-18 DIAGNOSIS — M25562 Pain in left knee: Secondary | ICD-10-CM | POA: Diagnosis not present

## 2020-02-20 DIAGNOSIS — M25562 Pain in left knee: Secondary | ICD-10-CM | POA: Diagnosis not present

## 2020-02-26 DIAGNOSIS — M25562 Pain in left knee: Secondary | ICD-10-CM | POA: Diagnosis not present

## 2020-02-28 DIAGNOSIS — M419 Scoliosis, unspecified: Secondary | ICD-10-CM | POA: Diagnosis not present

## 2020-03-02 DIAGNOSIS — M25562 Pain in left knee: Secondary | ICD-10-CM | POA: Diagnosis not present

## 2020-03-04 DIAGNOSIS — M25562 Pain in left knee: Secondary | ICD-10-CM | POA: Diagnosis not present

## 2020-03-09 DIAGNOSIS — M25562 Pain in left knee: Secondary | ICD-10-CM | POA: Diagnosis not present

## 2020-03-12 DIAGNOSIS — I129 Hypertensive chronic kidney disease with stage 1 through stage 4 chronic kidney disease, or unspecified chronic kidney disease: Secondary | ICD-10-CM | POA: Diagnosis not present

## 2020-03-12 DIAGNOSIS — Z87891 Personal history of nicotine dependence: Secondary | ICD-10-CM | POA: Diagnosis not present

## 2020-03-12 DIAGNOSIS — J441 Chronic obstructive pulmonary disease with (acute) exacerbation: Secondary | ICD-10-CM | POA: Diagnosis not present

## 2020-03-12 DIAGNOSIS — N183 Chronic kidney disease, stage 3 unspecified: Secondary | ICD-10-CM | POA: Diagnosis not present

## 2020-04-11 DIAGNOSIS — J441 Chronic obstructive pulmonary disease with (acute) exacerbation: Secondary | ICD-10-CM | POA: Diagnosis not present

## 2020-04-11 DIAGNOSIS — N183 Chronic kidney disease, stage 3 unspecified: Secondary | ICD-10-CM | POA: Diagnosis not present

## 2020-04-11 DIAGNOSIS — Z87891 Personal history of nicotine dependence: Secondary | ICD-10-CM | POA: Diagnosis not present

## 2020-04-11 DIAGNOSIS — I129 Hypertensive chronic kidney disease with stage 1 through stage 4 chronic kidney disease, or unspecified chronic kidney disease: Secondary | ICD-10-CM | POA: Diagnosis not present

## 2020-05-12 DIAGNOSIS — Z87891 Personal history of nicotine dependence: Secondary | ICD-10-CM | POA: Diagnosis not present

## 2020-05-12 DIAGNOSIS — N183 Chronic kidney disease, stage 3 unspecified: Secondary | ICD-10-CM | POA: Diagnosis not present

## 2020-05-12 DIAGNOSIS — J441 Chronic obstructive pulmonary disease with (acute) exacerbation: Secondary | ICD-10-CM | POA: Diagnosis not present

## 2020-05-12 DIAGNOSIS — I129 Hypertensive chronic kidney disease with stage 1 through stage 4 chronic kidney disease, or unspecified chronic kidney disease: Secondary | ICD-10-CM | POA: Diagnosis not present

## 2020-05-27 DIAGNOSIS — N8110 Cystocele, unspecified: Secondary | ICD-10-CM | POA: Diagnosis not present

## 2020-06-09 DIAGNOSIS — H40013 Open angle with borderline findings, low risk, bilateral: Secondary | ICD-10-CM | POA: Diagnosis not present

## 2020-06-09 DIAGNOSIS — H35033 Hypertensive retinopathy, bilateral: Secondary | ICD-10-CM | POA: Diagnosis not present

## 2020-06-09 DIAGNOSIS — H524 Presbyopia: Secondary | ICD-10-CM | POA: Diagnosis not present

## 2020-06-09 DIAGNOSIS — H04123 Dry eye syndrome of bilateral lacrimal glands: Secondary | ICD-10-CM | POA: Diagnosis not present

## 2020-06-09 DIAGNOSIS — H43813 Vitreous degeneration, bilateral: Secondary | ICD-10-CM | POA: Diagnosis not present

## 2020-06-09 DIAGNOSIS — H16223 Keratoconjunctivitis sicca, not specified as Sjogren's, bilateral: Secondary | ICD-10-CM | POA: Diagnosis not present

## 2020-06-12 DIAGNOSIS — Z87891 Personal history of nicotine dependence: Secondary | ICD-10-CM | POA: Diagnosis not present

## 2020-06-12 DIAGNOSIS — J441 Chronic obstructive pulmonary disease with (acute) exacerbation: Secondary | ICD-10-CM | POA: Diagnosis not present

## 2020-06-12 DIAGNOSIS — N183 Chronic kidney disease, stage 3 unspecified: Secondary | ICD-10-CM | POA: Diagnosis not present

## 2020-06-12 DIAGNOSIS — I129 Hypertensive chronic kidney disease with stage 1 through stage 4 chronic kidney disease, or unspecified chronic kidney disease: Secondary | ICD-10-CM | POA: Diagnosis not present

## 2020-06-19 DIAGNOSIS — Z96651 Presence of right artificial knee joint: Secondary | ICD-10-CM | POA: Diagnosis not present

## 2020-06-19 DIAGNOSIS — M25561 Pain in right knee: Secondary | ICD-10-CM | POA: Diagnosis not present

## 2020-06-25 ENCOUNTER — Telehealth: Payer: Self-pay

## 2020-06-25 ENCOUNTER — Ambulatory Visit: Payer: PPO | Admitting: Gastroenterology

## 2020-06-25 ENCOUNTER — Encounter: Payer: Self-pay | Admitting: Gastroenterology

## 2020-06-25 VITALS — BP 120/60 | HR 60 | Ht 67.0 in | Wt 198.8 lb

## 2020-06-25 DIAGNOSIS — R1031 Right lower quadrant pain: Secondary | ICD-10-CM

## 2020-06-25 DIAGNOSIS — K5909 Other constipation: Secondary | ICD-10-CM | POA: Diagnosis not present

## 2020-06-25 DIAGNOSIS — G8929 Other chronic pain: Secondary | ICD-10-CM | POA: Diagnosis not present

## 2020-06-25 DIAGNOSIS — Z86010 Personal history of colonic polyps: Secondary | ICD-10-CM

## 2020-06-25 MED ORDER — LINACLOTIDE 145 MCG PO CAPS: 145.0000 ug | ORAL_CAPSULE | Freq: Every day | ORAL | 3 refills | Status: DC

## 2020-06-25 MED ORDER — SUTAB 1479-225-188 MG PO TABS
1.0000 | ORAL_TABLET | ORAL | 0 refills | Status: DC
Start: 2020-06-25 — End: 2021-07-07

## 2020-06-25 NOTE — Progress Notes (Signed)
06/25/2020 Stephanie Alvarado [id number] 1941-04-29   HISTORY OF PRESENT ILLNESS: This is an 80 year old female who is a patient of Dr. Blanch Media.  She has past medical history of anxiety, coronary artery disease with stents last placed in 2011 no longer on antiplatelet therapy, hypertension, hyperlipidemia, sleep-related hypoventilation for which he uses CPAP and intermittent oxygen.  She has been seen here by me on a few occasions for complaints of right lower quadrant abdominal pain dating back at least to 2018.  She was last seen here, which was by me in March 2019 with the same complaint.  She has had a few CT scans performed over the last few years that have not shown any cause of this.  She says it is most bothersome at nighttime when she turns over in the bed.  She says that it is very painful, knifelike pain.  She says sometimes it is so painful she screams out in pain.  She would like to have a colonoscopy.  She also reports constipation.  She is currently using MiraLAX daily and Colace stool softeners without results.  She denies seeing any blood in her stool.  Her last colonoscopy was in November 2013 at which time she was found to have 2 flat polyps were removed and were tubular adenomas on pathology.  She also was found to have severe diverticulosis in the descending and sigmoid colon   Past Medical History:  Diagnosis Date  . Achilles tendon rupture, right, initial encounter   . Allergic rhinitis, cause unspecified   . Anemia, unspecified   . Anxiety state, unspecified   . Atherosclerosis of aorta ([place])    TEE, June, 2010, grade 4 aortic arch atherosclerosis , Dr. Aundra Dubin  . CAD (coronary artery disease)    DES and circumflex 2006 /  DES to LAD 2011  . Carotid artery disease ([place])    Doppler, November, 2011, stable, 0-39% bilateral, turbulent flow left subclavian  . Chronic diastolic heart failure ([place])   . Cyst of thyroid   . Diverticulosis of colon (without mention of  hemorrhage)   . Dysphasia    Possibly esophageal stricture  . Ejection fraction     EF 60%, Echo, November 12, 2008, /  EF 55%, TEE, November 17, 2008  . Esophageal reflux   . Hiatal hernia   . Hyperlipemia   . Hypertension   . Lumbago   . Lumbar disc disease   . MVP (mitral valve prolapse)   . Neuropathy   . Nonspecific abnormal finding in stool contents   . Obesity, unspecified   . Osteoarthrosis, unspecified whether generalized or localized, unspecified site   . Osteopenia   . Other diseases of lung, not elsewhere classified   . Palpitations    October, 2012  . Peripheral vascular disease, unspecified ([place])   . Personal history of colonic polyps    ADENOMATOUS POLYP  . PFO (patent foramen ovale)    Patient had a very small PFO by TEE 2010.  This was seen only by bubble analysis during Valsalva  . PONV (postoperative nausea and vomiting)   . Shortness of breath   . Sleep apnea    uses CPAP nightly  . Sleep related hypoventilation/hypoxemia in conditions classifiable elsewhere   . Stroke [facility]) 11/2008   Treated with TPA? /     2010, TEE no left atrial clot, probably from atherosclerosis of the aortic arch  . 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  JD Kellie Simmering.  Marland Kitchen Unspecified cerebral artery occlusion with cerebral infarction   . Unspecified venous (peripheral) insufficiency   . Vitamin B 12 deficiency    Past Surgical History:  Procedure Laterality Date  . ACHILLES TENDON SURGERY Right 04/05/2018   Procedure: Right achilles tendon reconstruction;  Surgeon: Wylene Simmer, MD;  Location: [place];  Service: Orthopedics;  Laterality: Right;  46min  . BACK SURGERY    . cataracts     both eyes  . CHOLECYSTECTOMY    . CORONARY ANGIOPLASTY WITH STENT PLACEMENT  1990's  . GASTROCNEMIUS RECESSION Right 04/05/2018   Procedure: Gastroc recession;  Surgeon: Wylene Simmer, MD;  Location: [place];  Service: Orthopedics;  Laterality: Right;  .  LAPAROSCOPIC HYSTERECTOMY    . LUMBAR DISC SURGERY     X 3  . RIGHT/LEFT HEART CATH AND CORONARY ANGIOGRAPHY N/A 04/10/2017   Procedure: RIGHT/LEFT HEART CATH AND CORONARY ANGIOGRAPHY;  Surgeon: Belva Crome, MD;  Location: [place];  Service: Cardiovascular;  Laterality: N/A;  . TOTAL KNEE ARTHROPLASTY Right 01/14/2019   Procedure: TOTAL KNEE ARTHROPLASTY;  Surgeon: Gaynelle Arabian, MD;  Location: [facility];  Service: Orthopedics;  Laterality: Right;  50min  . TOTAL KNEE ARTHROPLASTY Left 12/23/2019   Procedure: TOTAL KNEE ARTHROPLASTY;  Surgeon: Gaynelle Arabian, MD;  Location: [facility];  Service: Orthopedics;  Laterality: Left;  28min  . urethral suspension  1993   Dr. Nori Riis  . VARICOSE VEIN SURGERY     x 2    reports that she quit smoking about 22 years ago. Her smoking use included cigarettes. She has a 40.00 pack-year smoking history. She has never used smokeless tobacco. She reports that she does not drink alcohol and does not use drugs. family history includes Alzheimer's disease in her mother; Breast cancer in an other family member; Colon cancer in her paternal aunt; Heart attack in her father; Heart disease in her father. Allergies  Allergen Reactions  . Codeine Itching    REACTION: itch      Outpatient Encounter Medications as of 06/25/2020  Medication Sig  . amLODipine (NORVASC) 5 MG tablet Take 5 mg by mouth every morning.  . bisacodyl (DULCOLAX) 10 MG suppository Place 1 suppository (10 mg total) rectally daily as needed for moderate constipation.  Marland Kitchen buPROPion (WELLBUTRIN XL) 150 MG 24 hr tablet Take 300 mg by mouth daily.  Marland Kitchen docusate sodium (COLACE) 100 MG capsule Take 100 mg by mouth daily as needed for mild constipation.  . furosemide (LASIX) 40 MG tablet Take 40 mg by mouth daily as needed for edema.  . gabapentin (NEURONTIN) 300 MG capsule Take 600 mg by mouth 3 (three) times daily.   . isosorbide mononitrate (IMDUR) 30 MG 24 hr tablet Take 30 mg by mouth at bedtime.    . metoprolol tartrate (LOPRESSOR) 50 MG tablet Take 50 mg by mouth 2 (two) times daily.   . nitroGLYCERIN (NITROSTAT) 0.4 MG SL tablet Place 0.4 mg under the tongue every 5 (five) minutes as needed for chest pain (TAKE UP TO 3 DOSES BEFORE CALLING 911).  . pantoprazole (PROTONIX) 40 MG tablet Take 40 mg by mouth 2 (two) times daily.  . polyethylene glycol powder (GLYCOLAX/MIRALAX) powder Take 17 g by mouth daily as needed for moderate constipation.   . polyvinyl alcohol (LIQUIFILM TEARS) 1.4 % ophthalmic solution Place 1 drop into both eyes as needed for dry eyes.  . rosuvastatin (CRESTOR) 10 MG tablet Take 10 mg by  mouth at bedtime.  . [DISCONTINUED] methocarbamol (ROBAXIN) 500 MG tablet Take 1 tablet (500 mg total) by mouth every 6 (six) hours as needed for muscle spasms.  . [DISCONTINUED] ondansetron (ZOFRAN) 4 MG tablet Take 1 tablet (4 mg total) by mouth every 6 (six) hours as needed for nausea.  . [DISCONTINUED] oxyCODONE (OXY IR/ROXICODONE) 5 MG immediate release tablet Take 1-2 tablets (5-10 mg total) by mouth every 6 (six) hours as needed for severe pain.  . [DISCONTINUED] rivaroxaban (XARELTO) 10 MG TABS tablet Take 1 tablet (10 mg total) by mouth daily with breakfast for 19 days. Then resume one 81 mg aspirin once a day.   No facility-administered encounter medications on file as of 06/25/2020.     REVIEW OF SYSTEMS  : All other systems reviewed and negative except where noted in the History of Present Illness.   PHYSICAL EXAM: BP 120/60   Pulse 60   Ht 5\' 7"  (1.702 m)   Wt 198 lb 12.8 oz (90.2 kg)   SpO2 96%   BMI 31.14 kg/m  General: Well developed white female in no acute distress Head: Normocephalic and atraumatic Eyes:  Sclerae anicteric, conjunctiva pink. Ears: Normal auditory acuity Lungs: Clear throughout to auscultation; no W/R/R. Heart: Regular rate and rhythm; no M/R/G. Abdomen: Soft, non-distended.  BS present.  Mild RLQ TTP. Rectal:  Will be done at the time  of colonoscopy. Musculoskeletal: Symmetrical with no gross deformities  Skin: No lesions on visible extremities Extremities: No edema  Neurological: Alert oriented x 4, grossly non-focal Psychological:  Alert and cooperative. Normal mood and affect  ASSESSMENT AND PLAN: *Chronic right lower quadrant abdominal pain: I do not think that she has a GI source that is causing this.  She has had this since 2018 or 2019 at least and she has had 2 or 3 CT scans that have not shown any cause of her pain.  She would like to have a colonoscopy for reassurance.  This needs to be done at [facility] due to her intermittent oxygen use.  We will schedule Dr. Henrene Pastor.  Will give 2 day bowel prep due to her chronic constipation.  The risks, benefits, and alternatives to colonoscopy were discussed with the patient and she consents to proceed.  *Chronic constipation: Taking MiraLAX daily and Colace stool softeners without results.  She would like to try Linzess.  We will start at 145 mcg daily.  Samples were given and prescription was sent to pharmacy.  She will call us with an update *Personal history of adenomatous colon polyps on colonoscopy in November 2013.  Colonoscopy as above.   CC:  Caryl Bis, MD

## 2020-06-25 NOTE — Telephone Encounter (Signed)
The pt has been advised and is ok with cancelling the colon and proceeding with BE. The pt has been advised and a copy of the instructions have been mailed to her home.   You are scheduled for a Barium enema x-ray examination on 07/02/20 at [facility] at 10:30 am.   Be prepared to spend 2 to 2 1/2 hours at the Radiology Department  It is very important that you follow the instructions below.  Failure to follow these instructions may result in a study that is less than optimal and may lead to the necessity of repeating the examination.   Barium Enema Prep  Please purchase the following items from the laxative section of your drug store 10 oz.  Magnesium Citrate Bottle 3 Bisacodyl Tablets-Enteric coated 1 Bisacodyl Suppository   Day before exam  12:00 Lunch. This meal may include clear broth, white chicken meat sandwich (no butter, lettuce or other additives), or two hard boiled eggs, strained fruit juices, jello or gelatin ( not containing fruits or nuts).  Coffee or tea (without milk or cream), carbonated beverages.  1:00 pm Drink one full glass or more of water   3:00 pm Drink one full glass or more of water  5:00 pm  Supper.  It is very important that supper be limited to the items described here Broth, Clear jello/gelatin with no whole foods added (no red), soft drinks, gatorade, water black coffee or tea (no milk or cream), popsicles.   No red jello or popsicles.   7:00 pm drink one full glass of water  8:00 pm Drink the bottle of Magnesium Citrate (drink cold if preferred)  10:00 pm  Take three (3) Bisacodyl tablets with one full glass or more of water.   Day of exam  NO breakfast, you may have coffee, tea (without milk or cream), or strained fruit juice 7:00 am unwrap the foil wrapper from bisacodyl suppository and discard the foil.  While lying on your side with thigh elevated, insert the suppository into the rectum and gently push in as far as possible.  Retain the  suppository for at least 15 minutes, if possible, before evacuating, even if the urge is strong. Bowel evacuation usually occurs within 15 to 60 minutes.  Patients requiring assistance should have a bed pan, commode or help readily available.

## 2020-06-25 NOTE — Patient Instructions (Signed)
If you are age 82 or older, your body mass index should be between 23-30. Your Body mass index is 31.14 kg/m. If this is out of the aforementioned range listed, please consider follow up with your Primary Care Provider.  If you are age 40 or younger, your body mass index should be between 19-25. Your Body mass index is 31.14 kg/m. If this is out of the aformentioned range listed, please consider follow up with your Primary Care Provider.   You have been scheduled for a colonoscopy. Please follow written instructions given to you at your visit today.  Please pick up your prep supplies at the pharmacy within the next 1-3 days. If you use inhalers (even only as needed), please bring them with you on the day of your procedure.  START Linzess 145 mcg daily. We have given you samples today in the office and sent a prescription to the pharmacy.  Follow up pending the results of your Colonoscopy or as needed.  Thank you for entrusting me with your care and choosing [facility].  Alonza Bogus, PA_C

## 2020-06-25 NOTE — Progress Notes (Signed)
Stephanie Alvarado, Assessment and plan reviewed.  As it is unlikely that this patient's pain is related to her colon (she has had prior colonoscopies, longstanding nature, negative CT scans) and she is greater than 80 years old, with significant comorbidities on oxygen-- I would not plan on invasive colonoscopy as a next step.  You should set her up for air-contrast barium enema to rule out colonic mass.  This should be sufficient.  You can explain to her that this was my recommendation after reviewing her record.  The barium enema accomplishes the same (rule out colonic mass or colon cause for pain) without the risk.  Thank you. Dr. Henrene Pastor

## 2020-06-25 NOTE — Telephone Encounter (Signed)
-----   Message from Loralie Champagne, PA-C sent at 06/25/2020  1:56 PM EST ----- I saw this patient in clinic today.  Please contact her and let her know that Dr. Henrene Pastor reviewed my note after I saw her today and he does not think that we should proceed with colonoscopy due to her other medical issues.  He suggested that rather she have a barium enema to further look at her colon and rule out masses, etc.  Colonoscopy will need to be canceled.    Thank you,  Jess  ----- Message ----- From: Irene Shipper, MD Sent: 06/25/2020   1:53 PM EST To: Loralie Champagne, PA-C    ----- Message ----- From: Loralie Champagne, PA-C Sent: 06/25/2020   1:45 PM EST To: Irene Shipper, MD

## 2020-07-02 ENCOUNTER — Ambulatory Visit (HOSPITAL_COMMUNITY): Admission: RE | Admit: 2020-07-02 | Payer: PPO | Source: Ambulatory Visit

## 2020-07-03 DIAGNOSIS — N183 Chronic kidney disease, stage 3 unspecified: Secondary | ICD-10-CM | POA: Diagnosis not present

## 2020-07-03 DIAGNOSIS — I10 Essential (primary) hypertension: Secondary | ICD-10-CM | POA: Diagnosis not present

## 2020-07-03 DIAGNOSIS — Z0001 Encounter for general adult medical examination with abnormal findings: Secondary | ICD-10-CM | POA: Diagnosis not present

## 2020-07-03 DIAGNOSIS — E039 Hypothyroidism, unspecified: Secondary | ICD-10-CM | POA: Diagnosis not present

## 2020-07-03 DIAGNOSIS — G4733 Obstructive sleep apnea (adult) (pediatric): Secondary | ICD-10-CM | POA: Diagnosis not present

## 2020-07-03 DIAGNOSIS — R2689 Other abnormalities of gait and mobility: Secondary | ICD-10-CM | POA: Diagnosis not present

## 2020-07-03 DIAGNOSIS — E7849 Other hyperlipidemia: Secondary | ICD-10-CM | POA: Diagnosis not present

## 2020-07-07 DIAGNOSIS — M67261 Synovial hypertrophy, not elsewhere classified, right lower leg: Secondary | ICD-10-CM | POA: Diagnosis not present

## 2020-07-07 DIAGNOSIS — M25561 Pain in right knee: Secondary | ICD-10-CM | POA: Diagnosis not present

## 2020-07-07 DIAGNOSIS — Z96651 Presence of right artificial knee joint: Secondary | ICD-10-CM | POA: Diagnosis not present

## 2020-07-07 DIAGNOSIS — M659 Synovitis and tenosynovitis, unspecified: Secondary | ICD-10-CM | POA: Diagnosis not present

## 2020-07-10 ENCOUNTER — Telehealth: Payer: Self-pay | Admitting: Gastroenterology

## 2020-07-10 ENCOUNTER — Other Ambulatory Visit (HOSPITAL_COMMUNITY): Payer: PPO

## 2020-07-10 NOTE — Telephone Encounter (Signed)
Called patient advised that we currently did not have any samples, but we had sent in a prescription for her when she was in the office. She let me know that her pharmacy had advised her that it would be $180 and she had discussed this with Patty when she talked to her about having some imaging done. She had spoke with her insurance company that advised her that if we sent in a 90 day supply it would only be $90. I went back and checked the prescription that was sent in and let her know that we did send in a 90 day. She stated that she would be contacting the pharmacy and if they gave her any problems about the cost or what the quantity that was sent in that she would call us and have it sent to another pharmacy.

## 2020-07-10 NOTE — Telephone Encounter (Signed)
Pt is requesting more samples of Linzess. Pls call her.

## 2020-07-11 DIAGNOSIS — J441 Chronic obstructive pulmonary disease with (acute) exacerbation: Secondary | ICD-10-CM | POA: Diagnosis not present

## 2020-07-11 DIAGNOSIS — N183 Chronic kidney disease, stage 3 unspecified: Secondary | ICD-10-CM | POA: Diagnosis not present

## 2020-07-11 DIAGNOSIS — E7849 Other hyperlipidemia: Secondary | ICD-10-CM | POA: Diagnosis not present

## 2020-07-11 DIAGNOSIS — I129 Hypertensive chronic kidney disease with stage 1 through stage 4 chronic kidney disease, or unspecified chronic kidney disease: Secondary | ICD-10-CM | POA: Diagnosis not present

## 2020-07-11 DIAGNOSIS — Z87891 Personal history of nicotine dependence: Secondary | ICD-10-CM | POA: Diagnosis not present

## 2020-07-14 ENCOUNTER — Encounter (HOSPITAL_COMMUNITY): Payer: Self-pay

## 2020-07-14 ENCOUNTER — Ambulatory Visit (HOSPITAL_COMMUNITY): Admit: 2020-07-14 | Payer: PPO | Admitting: Internal Medicine

## 2020-07-14 SURGERY — COLONOSCOPY WITH PROPOFOL
Anesthesia: Monitor Anesthesia Care

## 2020-07-22 ENCOUNTER — Telehealth: Payer: Self-pay | Admitting: Cardiovascular Disease

## 2020-07-22 NOTE — Telephone Encounter (Signed)
Patient has called 5x to schedule her recall appointment with Dr. Burt Knack but has not had any success. She is due to see Dr. Burt Knack in March 2022 and would only like to see him. Please let her know if there is any way she can get an appointment with Dr. Burt Knack

## 2020-07-23 NOTE — Telephone Encounter (Signed)
Scheduled the patient 09/11/20 with Dr. Burt Knack. She was grateful for call and agrees with plan.

## 2020-07-30 DIAGNOSIS — R351 Nocturia: Secondary | ICD-10-CM | POA: Diagnosis not present

## 2020-07-30 DIAGNOSIS — R82998 Other abnormal findings in urine: Secondary | ICD-10-CM | POA: Diagnosis not present

## 2020-07-30 DIAGNOSIS — K469 Unspecified abdominal hernia without obstruction or gangrene: Secondary | ICD-10-CM | POA: Diagnosis not present

## 2020-07-30 DIAGNOSIS — N3941 Urge incontinence: Secondary | ICD-10-CM | POA: Diagnosis not present

## 2020-07-30 DIAGNOSIS — N952 Postmenopausal atrophic vaginitis: Secondary | ICD-10-CM | POA: Diagnosis not present

## 2020-07-30 DIAGNOSIS — N3281 Overactive bladder: Secondary | ICD-10-CM | POA: Diagnosis not present

## 2020-08-03 DIAGNOSIS — R351 Nocturia: Secondary | ICD-10-CM | POA: Diagnosis not present

## 2020-08-10 DIAGNOSIS — Z87891 Personal history of nicotine dependence: Secondary | ICD-10-CM | POA: Diagnosis not present

## 2020-08-10 DIAGNOSIS — J441 Chronic obstructive pulmonary disease with (acute) exacerbation: Secondary | ICD-10-CM | POA: Diagnosis not present

## 2020-08-10 DIAGNOSIS — I129 Hypertensive chronic kidney disease with stage 1 through stage 4 chronic kidney disease, or unspecified chronic kidney disease: Secondary | ICD-10-CM | POA: Diagnosis not present

## 2020-08-10 DIAGNOSIS — N183 Chronic kidney disease, stage 3 unspecified: Secondary | ICD-10-CM | POA: Diagnosis not present

## 2020-08-10 DIAGNOSIS — E7849 Other hyperlipidemia: Secondary | ICD-10-CM | POA: Diagnosis not present

## 2020-09-02 DIAGNOSIS — K5901 Slow transit constipation: Secondary | ICD-10-CM | POA: Diagnosis not present

## 2020-09-02 DIAGNOSIS — N3281 Overactive bladder: Secondary | ICD-10-CM | POA: Diagnosis not present

## 2020-09-02 DIAGNOSIS — N815 Vaginal enterocele: Secondary | ICD-10-CM | POA: Diagnosis not present

## 2020-09-02 DIAGNOSIS — R351 Nocturia: Secondary | ICD-10-CM | POA: Diagnosis not present

## 2020-09-09 DIAGNOSIS — J441 Chronic obstructive pulmonary disease with (acute) exacerbation: Secondary | ICD-10-CM | POA: Diagnosis not present

## 2020-09-09 DIAGNOSIS — I129 Hypertensive chronic kidney disease with stage 1 through stage 4 chronic kidney disease, or unspecified chronic kidney disease: Secondary | ICD-10-CM | POA: Diagnosis not present

## 2020-09-09 DIAGNOSIS — Z87891 Personal history of nicotine dependence: Secondary | ICD-10-CM | POA: Diagnosis not present

## 2020-09-09 DIAGNOSIS — E7849 Other hyperlipidemia: Secondary | ICD-10-CM | POA: Diagnosis not present

## 2020-09-09 DIAGNOSIS — N183 Chronic kidney disease, stage 3 unspecified: Secondary | ICD-10-CM | POA: Diagnosis not present

## 2020-09-11 ENCOUNTER — Other Ambulatory Visit: Payer: Self-pay

## 2020-09-11 ENCOUNTER — Ambulatory Visit: Payer: PPO | Admitting: Cardiovascular Disease

## 2020-09-11 ENCOUNTER — Encounter: Payer: Self-pay | Admitting: Cardiovascular Disease

## 2020-09-11 VITALS — BP 110/73 | HR 66 | Ht 68.0 in | Wt 200.2 lb

## 2020-09-11 DIAGNOSIS — R0602 Shortness of breath: Secondary | ICD-10-CM | POA: Diagnosis not present

## 2020-09-11 DIAGNOSIS — E782 Mixed hyperlipidemia: Secondary | ICD-10-CM | POA: Diagnosis not present

## 2020-09-11 DIAGNOSIS — I2510 Atherosclerotic heart disease of native coronary artery without angina pectoris: Secondary | ICD-10-CM | POA: Diagnosis not present

## 2020-09-11 NOTE — Progress Notes (Signed)
Cardiology Office Note:    Date:  09/11/2020   ID:  Stephanie Alvarado, DOB 1940/12/11, MRN [id number]  PCP:  Caryl Bis, [place]  Cardiologist:  Sherren Mocha, MD  Advanced Practice Provider:  No care team member to display Electrophysiologist:  None       Referring MD: Caryl Bis, MD   Chief Complaint  Patient presents with  . Shortness of Breath    History of Present Illness:    Stephanie Alvarado is a 80 y.o. female with a hx of coronary artery disease, presenting for follow-up evaluation.  The patient CAD dates back to 2006 when she underwent PCI of the right coronary artery with a Zomax study stent. In 2009 she developed recurrent symptoms and was found to have progressive stenosis in the proximal LAD, treated with a Promus drug-eluting stent at that time. A nuclear stress scan in 2017 showed no significant ischemia. Her last echocardiogram demonstrated normal LV systolic function with mild diastolic dysfunction.  She is here alone today. She continues to have problems with fatigue and shortness of breath. States she was short of breath just walking from the front door into our office. No chest pain or tightness. No orthopnea or PND. She has some sharp pains below her left rib cage. She complains of bilateral ankle swelling.   Past Medical History:  Diagnosis Date  . Achilles tendon rupture, right, initial encounter   . Allergic rhinitis, cause unspecified   . Anemia, unspecified   . Anxiety state, unspecified   . Atherosclerosis of aorta ([place])    TEE, June, 2010, grade 4 aortic arch atherosclerosis , Dr. Aundra Dubin  . CAD (coronary artery disease)    DES and circumflex 2006 /  DES to LAD 2011  . Carotid artery disease ([place])    Doppler, November, 2011, stable, 0-39% bilateral, turbulent flow left subclavian  . Chronic diastolic heart failure ([place])   . Cyst of thyroid   . Diverticulosis of colon (without mention of hemorrhage)   .  Dysphasia    Possibly esophageal stricture  . Ejection fraction     EF 60%, Echo, November 12, 2008, /  EF 55%, TEE, November 17, 2008  . Esophageal reflux   . Hiatal hernia   . Hyperlipemia   . Hypertension   . Lumbago   . Lumbar disc disease   . MVP (mitral valve prolapse)   . Neuropathy   . Nonspecific abnormal finding in stool contents   . Obesity, unspecified   . Osteoarthrosis, unspecified whether generalized or localized, unspecified site   . Osteopenia   . Other diseases of lung, not elsewhere classified   . Palpitations    October, 2012  . Peripheral vascular disease, unspecified ([place])   . Personal history of colonic polyps    ADENOMATOUS POLYP  . PFO (patent foramen ovale)    Patient had a very small PFO by TEE 2010.  This was seen only by bubble analysis during Valsalva  . PONV (postoperative nausea and vomiting)   . Shortness of breath   . Sleep apnea    uses CPAP nightly  . Sleep related hypoventilation/hypoxemia in conditions classifiable elsewhere   . Stroke [facility]) 11/2008   Treated with TPA? /     2010, TEE no left atrial clot, probably from atherosclerosis of the aortic arch  . 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 Victorino Dike.  Marland Kitchen Unspecified cerebral artery  occlusion with cerebral infarction   . Unspecified venous (peripheral) insufficiency   . Vitamin B 12 deficiency     Past Surgical History:  Procedure Laterality Date  . ACHILLES TENDON SURGERY Right 04/05/2018   Procedure: Right achilles tendon reconstruction;  Surgeon: Wylene Simmer, MD;  Location: [place];  Service: Orthopedics;  Laterality: Right;  64mn  . BACK SURGERY    . cataracts     both eyes  . CHOLECYSTECTOMY    . CORONARY ANGIOPLASTY WITH STENT PLACEMENT  1990's  . GASTROCNEMIUS RECESSION Right 04/05/2018   Procedure: Gastroc recession;  Surgeon: HWylene Simmer MD;  [facility]  Service: Orthopedics;  Laterality: Right;  . LAPAROSCOPIC  HYSTERECTOMY    . LUMBAR DISC SURGERY     X 3  . RIGHT/LEFT HEART CATH AND CORONARY ANGIOGRAPHY N/A 04/10/2017   Procedure: RIGHT/LEFT HEART CATH AND CORONARY ANGIOGRAPHY;  Surgeon: SBelva Crome MD;  [facility];  Service: Cardiovascular;  Laterality: N/A;  . TOTAL KNEE ARTHROPLASTY Right 01/14/2019   Procedure: TOTAL KNEE ARTHROPLASTY;  Surgeon: AGaynelle Arabian MD;  [facility];  Service: Orthopedics;  Laterality: Right;  513m  . TOTAL KNEE ARTHROPLASTY Left 12/23/2019   Procedure: TOTAL KNEE ARTHROPLASTY;  Surgeon: AlGaynelle ArabianMD;  Location: [facility];  Service: Orthopedics;  Laterality: Left;  5058m . urethral suspension  1993   Dr. NeaNori Riis VARICOSE VEIN SURGERY     x 2    Current Medications: Current Meds  Medication Sig  . bisacodyl (DULCOLAX) 10 MG suppository Place 1 suppository (10 mg total) rectally daily as needed for moderate constipation.  . bMarland KitchenPROPion (WELLBUTRIN XL) 150 MG 24 hr tablet Take 300 mg by mouth daily.  . dMarland Kitchencusate sodium (COLACE) 100 MG capsule Take 100 mg by mouth daily as needed for mild constipation.  . eMarland Kitchentradiol (ESTRACE) 0.1 MG/GM vaginal cream Place vaginally.  . furosemide (LASIX) 40 MG tablet Take 40 mg by mouth daily as needed for edema.  . gabapentin (NEURONTIN) 300 MG capsule Take 600 mg by mouth 3 (three) times daily.   . isosorbide mononitrate (IMDUR) 30 MG 24 hr tablet Take 30 mg by mouth at bedtime.   . lMarland Kitchennaclotide (LINZESS) 145 MCG CAPS capsule Take 1 capsule (145 mcg total) by mouth daily before breakfast.  . Melatonin 10 MG TABS Take by mouth.  . metoprolol tartrate (LOPRESSOR) 50 MG tablet Take 50 mg by mouth 2 (two) times daily.   . Multiple Vitamin (MULTIVITAMIN) capsule Take 1 capsule by mouth daily.  . nitroGLYCERIN (NITROSTAT) 0.4 MG SL tablet Place 0.4 mg under the tongue every 5 (five) minutes as needed for chest pain (TAKE UP TO 3 DOSES BEFORE CALLING 911).  . pantoprazole (PROTONIX) 40 MG tablet Take 40 mg by  mouth 2 (two) times daily.  . polyethylene glycol powder (GLYCOLAX/MIRALAX) powder Take 17 g by mouth daily as needed for moderate constipation.   . polyvinyl alcohol (LIQUIFILM TEARS) 1.4 % ophthalmic solution Place 1 drop into both eyes as needed for dry eyes.  . rosuvastatin (CRESTOR) 10 MG tablet Take 10 mg by mouth at bedtime.  . Sodium Sulfate-Mag Sulfate-KCl (SUTAB) [phone] TABS Take 1 kit by mouth as directed. MANUFACTURER CODES!! BIN: [id number]: CN GROUP: [id number] ID: [id number];[employer] SECONDARY INSURANCE ;NO PRIOR AUTHORIZATION  . [DISCONTINUED] amLODipine (NORVASC) 5 MG tablet Take 5 mg by mouth every morning.     Allergies:   Codeine  Social History   Socioeconomic History  . Marital status: Widowed    Spouse name: Not on file  . Number of children: 4  . Years of education: Not on file  . Highest education level: Not on file  Occupational History  . Occupation: Retired    Fish farm manager: RETIRED  Tobacco Use  . Smoking status: Former Smoker    Packs/day: 1.00    Years: 40.00    Pack years: 40.00    Types: Cigarettes    Quit date: 06/13/1998    Years since quitting: 22.2  . Smokeless tobacco: Never Used  Vaping Use  . Vaping Use: Never used  Substance and Sexual Activity  . Alcohol use: No    Alcohol/week: 0.0 standard drinks  . Drug use: No  . Sexual activity: Not on file  Other Topics Concern  . Not on file  Social History Narrative  . Not on file   Social Determinants of Health   Financial Resource Strain: Not on file  Food Insecurity: Not on file  Transportation Needs: Not on file  Physical Activity: Not on file  Stress: Not on file  Social Connections: Not on file     Family History: The patient's family history includes Alzheimer's disease in her mother; Breast cancer in an other family member; Colon cancer in her paternal aunt; Heart attack in her father; Heart disease in her father. There is no history of Esophageal cancer, Rectal  cancer, or Stomach cancer.  ROS:   Please see the history of present illness.    All other systems reviewed and are negative.  EKGs/Labs/Other Studies Reviewed:    The following studies were reviewed today: Echo 03/12/2019: IMPRESSIONS    1. Left ventricular ejection fraction, by visual estimation, is 60 to  65%. The left ventricle has normal function. Normal left ventricular size.  There is no left ventricular hypertrophy.  2. Left ventricular diastolic Doppler parameters are indeterminate  pattern of LV diastolic filling.  3. Global right ventricle has normal systolic function.The right  ventricular size is mildly enlarged. No increase in right ventricular wall  thickness.  4. Left atrial size was moderately dilated.  5. Right atrial size was normal.  6. The mitral valve is normal in structure. Mild mitral valve  regurgitation. No evidence of mitral stenosis.  7. The tricuspid valve is normal in structure. Tricuspid valve  regurgitation is mild.  8. The aortic valve is normal in structure. Aortic valve regurgitation  was not visualized by color flow Doppler. Structurally normal aortic  valve, with no evidence of sclerosis or stenosis.  9. The pulmonic valve was normal in structure. Pulmonic valve  regurgitation is trivial by color flow Doppler.  10. Normal pulmonary artery systolic pressure.  11. The inferior vena cava is normal in size with greater than 50%  respiratory variability, suggesting right atrial pressure of 3 mmHg.  12. There is right bowing of the interatrial septum, suggestive of  elevated left atrial pressure.   Myoview 03/12/2019: Study Highlights   Nuclear stress EF: 65%.  There was no ST segment deviation noted during stress.  No T wave inversion was noted during stress.  The study is normal.  This is a low risk study.  The left ventricular ejection fraction is normal (55-65%).    EKG:  EKG is ordered today.  The ekg ordered today  demonstrates NSR 66 bpm, within normal limits  Recent Labs: 12/11/2019: ALT 13 12/25/2019: BUN 19; Creatinine, Ser 0.84; Hemoglobin 9.9; Platelets 138;  Potassium 4.2; Sodium 137  Recent Lipid Panel    Component Value Date/Time   CHOL 160 05/31/2016 0641   TRIG 144 05/31/2016 0641   TRIG 135 03/28/2006 0936   HDL 73 05/31/2016 0641   CHOLHDL 2.2 05/31/2016 0641   VLDL 29 05/31/2016 0641   LDLCALC 58 05/31/2016 0641   LDLDIRECT 123.3 04/29/2010 0000     Risk Assessment/Calculations:       Physical Exam:    VS:  BP 110/73   Pulse 66   Ht 5' 8"  (1.727 m)   Wt 200 lb 3.2 oz (90.8 kg)   SpO2 93%   BMI 30.44 kg/m     Wt Readings from Last 3 Encounters:  09/11/20 200 lb 3.2 oz (90.8 kg)  06/25/20 198 lb 12.8 oz (90.2 kg)  12/23/19 201 lb 4.5 oz (91.3 kg)     GEN:  Well nourished, well developed in no acute distress HEENT: Normal NECK: No JVD; No carotid bruits LYMPHATICS: No lymphadenopathy CARDIAC: RRR, no murmurs, rubs, gallops RESPIRATORY:  Clear to auscultation without rales, wheezing or rhonchi  ABDOMEN: Soft, non-tender, non-distended MUSCULOSKELETAL:  Trace bilateral ankle/pretibial edema; No deformity  SKIN: Warm and dry NEUROLOGIC:  Alert and oriented x 3 PSYCHIATRIC:  Normal affect   ASSESSMENT:    1. Coronary artery disease involving native coronary artery of native heart without angina pectoris   2. Mixed hyperlipidemia   3. SOB (shortness of breath)    PLAN:    In order of problems listed above:  1. No new anginal symptoms.  Stress Myoview last year showed no ischemia.  Overall the patient appears stable from a coronary standpoint and she will continue on rosuvastatin for lipid lowering and aspirin for antiplatelet treatment. 2. Treated with rosuvastatin, cholesterol 119, HDL 64, LDL 43, triglycerides 58, ALT 13.  Continue same therapy. 3. Will check 2D echo to reassess LV systolic and diastolic function.  Mild edema noted on her exam and this is a  prominent complaint for her.  Will discontinue amlodipine.  Have asked her to check her blood pressure a few days per week.  She will call us if her blood pressure is running any higher than 140/90.    Medication Adjustments/Labs and Tests Ordered: Current medicines are reviewed at length with the patient today.  Concerns regarding medicines are outlined above.  Orders Placed This Encounter  Procedures  . EKG 12-Lead  . ECHOCARDIOGRAM COMPLETE   No orders of the defined types were placed in this encounter.   Patient Instructions  Medication Instructions:  1) STOP AMLODIPINE. Check your blood pressure a couple time a week (make sure you take your BP 1-2 hours after you taking your morning medications and when you are nice and calm). Call us if your BP is consistently over 140/90. *If you need a refill on your cardiac medications before your next appointment, please call your pharmacy*  Testing/Procedures: Your provider has requested that you have an echocardiogram. Echocardiography is a painless test that uses sound waves to create images of your heart. It provides your doctor with information about the size and shape of your heart and how well your heart's chambers and valves are working. This procedure takes approximately one hour. There are no restrictions for this procedure.  Follow-Up: At [facility], you and your health needs are our priority.  As part of our continuing mission to provide you with exceptional heart care, we have created designated Provider Care Teams.  These Care Teams include  your primary Cardiologist (physician) and Advanced Practice Providers (APPs -  Physician Assistants and Nurse Practitioners) who all work together to provide you with the care you need, when you need it. Your next appointment:   12 month(s) The format for your next appointment:   In Person Provider:   You may see Sherren Mocha, MD or one of the following Advanced Practice Providers on your  designated Care Team:    Richardson Dopp, PA-C  Robbie Lis, [place]      Signed, Sherren Mocha, MD  09/11/2020 10:31 PM    [facility]

## 2020-09-11 NOTE — Patient Instructions (Signed)
Medication Instructions:  1) STOP AMLODIPINE. Check your blood pressure a couple time a week (make sure you take your BP 1-2 hours after you taking your morning medications and when you are nice and calm). Call us if your BP is consistently over 140/90. *If you need a refill on your cardiac medications before your next appointment, please call your pharmacy*  Testing/Procedures: Your provider has requested that you have an echocardiogram. Echocardiography is a painless test that uses sound waves to create images of your heart. It provides your doctor with information about the size and shape of your heart and how well your heart's chambers and valves are working. This procedure takes approximately one hour. There are no restrictions for this procedure.  Follow-Up: At [facility], you and your health needs are our priority.  As part of our continuing mission to provide you with exceptional heart care, we have created designated Provider Care Teams.  These Care Teams include your primary Cardiologist (physician) and Advanced Practice Providers (APPs -  Physician Assistants and Nurse Practitioners) who all work together to provide you with the care you need, when you need it. Your next appointment:   12 month(s) The format for your next appointment:   In Person Provider:   You may see Sherren Mocha, MD or one of the following Advanced Practice Providers on your designated Care Team:    Richardson Dopp, PA-C  [place], [place]

## 2020-09-29 ENCOUNTER — Other Ambulatory Visit (HOSPITAL_BASED_OUTPATIENT_CLINIC_OR_DEPARTMENT_OTHER): Payer: Self-pay

## 2020-10-10 DIAGNOSIS — I129 Hypertensive chronic kidney disease with stage 1 through stage 4 chronic kidney disease, or unspecified chronic kidney disease: Secondary | ICD-10-CM | POA: Diagnosis not present

## 2020-10-10 DIAGNOSIS — N183 Chronic kidney disease, stage 3 unspecified: Secondary | ICD-10-CM | POA: Diagnosis not present

## 2020-10-10 DIAGNOSIS — Z87891 Personal history of nicotine dependence: Secondary | ICD-10-CM | POA: Diagnosis not present

## 2020-10-10 DIAGNOSIS — J441 Chronic obstructive pulmonary disease with (acute) exacerbation: Secondary | ICD-10-CM | POA: Diagnosis not present

## 2020-10-10 DIAGNOSIS — E7849 Other hyperlipidemia: Secondary | ICD-10-CM | POA: Diagnosis not present

## 2020-10-28 DIAGNOSIS — R609 Edema, unspecified: Secondary | ICD-10-CM | POA: Diagnosis not present

## 2020-10-28 DIAGNOSIS — Z6831 Body mass index (BMI) 31.0-31.9, adult: Secondary | ICD-10-CM | POA: Diagnosis not present

## 2020-10-28 DIAGNOSIS — R0789 Other chest pain: Secondary | ICD-10-CM | POA: Diagnosis not present

## 2020-10-28 DIAGNOSIS — Z23 Encounter for immunization: Secondary | ICD-10-CM | POA: Diagnosis not present

## 2020-11-09 DIAGNOSIS — N183 Chronic kidney disease, stage 3 unspecified: Secondary | ICD-10-CM | POA: Diagnosis not present

## 2020-11-09 DIAGNOSIS — I129 Hypertensive chronic kidney disease with stage 1 through stage 4 chronic kidney disease, or unspecified chronic kidney disease: Secondary | ICD-10-CM | POA: Diagnosis not present

## 2020-11-09 DIAGNOSIS — Z87891 Personal history of nicotine dependence: Secondary | ICD-10-CM | POA: Diagnosis not present

## 2020-11-09 DIAGNOSIS — E7849 Other hyperlipidemia: Secondary | ICD-10-CM | POA: Diagnosis not present

## 2020-11-09 DIAGNOSIS — J441 Chronic obstructive pulmonary disease with (acute) exacerbation: Secondary | ICD-10-CM | POA: Diagnosis not present

## 2020-11-16 ENCOUNTER — Ambulatory Visit (HOSPITAL_COMMUNITY): Payer: PPO | Attending: Cardiovascular Disease

## 2020-11-16 ENCOUNTER — Other Ambulatory Visit: Payer: Self-pay

## 2020-11-16 DIAGNOSIS — R0602 Shortness of breath: Secondary | ICD-10-CM | POA: Diagnosis not present

## 2020-11-16 LAB — ECHOCARDIOGRAM COMPLETE
Area-P 1/2: 3.44 cm2
S' Lateral: 3.4 cm

## 2020-11-26 DIAGNOSIS — G4733 Obstructive sleep apnea (adult) (pediatric): Secondary | ICD-10-CM | POA: Diagnosis not present

## 2020-11-26 DIAGNOSIS — R0902 Hypoxemia: Secondary | ICD-10-CM | POA: Diagnosis not present

## 2020-12-02 DIAGNOSIS — M5432 Sciatica, left side: Secondary | ICD-10-CM | POA: Diagnosis not present

## 2020-12-02 DIAGNOSIS — Z96653 Presence of artificial knee joint, bilateral: Secondary | ICD-10-CM | POA: Diagnosis not present

## 2020-12-02 DIAGNOSIS — M25552 Pain in left hip: Secondary | ICD-10-CM | POA: Diagnosis not present

## 2020-12-10 DIAGNOSIS — Z87891 Personal history of nicotine dependence: Secondary | ICD-10-CM | POA: Diagnosis not present

## 2020-12-10 DIAGNOSIS — J441 Chronic obstructive pulmonary disease with (acute) exacerbation: Secondary | ICD-10-CM | POA: Diagnosis not present

## 2020-12-10 DIAGNOSIS — I129 Hypertensive chronic kidney disease with stage 1 through stage 4 chronic kidney disease, or unspecified chronic kidney disease: Secondary | ICD-10-CM | POA: Diagnosis not present

## 2020-12-10 DIAGNOSIS — N183 Chronic kidney disease, stage 3 unspecified: Secondary | ICD-10-CM | POA: Diagnosis not present

## 2020-12-10 DIAGNOSIS — E7849 Other hyperlipidemia: Secondary | ICD-10-CM | POA: Diagnosis not present

## 2020-12-22 DIAGNOSIS — H348312 Tributary (branch) retinal vein occlusion, right eye, stable: Secondary | ICD-10-CM | POA: Diagnosis not present

## 2020-12-22 DIAGNOSIS — H35033 Hypertensive retinopathy, bilateral: Secondary | ICD-10-CM | POA: Diagnosis not present

## 2020-12-22 DIAGNOSIS — H524 Presbyopia: Secondary | ICD-10-CM | POA: Diagnosis not present

## 2020-12-22 DIAGNOSIS — H35363 Drusen (degenerative) of macula, bilateral: Secondary | ICD-10-CM | POA: Diagnosis not present

## 2020-12-22 DIAGNOSIS — H40013 Open angle with borderline findings, low risk, bilateral: Secondary | ICD-10-CM | POA: Diagnosis not present

## 2020-12-22 DIAGNOSIS — H52223 Regular astigmatism, bilateral: Secondary | ICD-10-CM | POA: Diagnosis not present

## 2020-12-26 DIAGNOSIS — R0902 Hypoxemia: Secondary | ICD-10-CM | POA: Diagnosis not present

## 2020-12-26 DIAGNOSIS — G4733 Obstructive sleep apnea (adult) (pediatric): Secondary | ICD-10-CM | POA: Diagnosis not present

## 2021-01-06 DIAGNOSIS — M5416 Radiculopathy, lumbar region: Secondary | ICD-10-CM | POA: Diagnosis not present

## 2021-01-07 ENCOUNTER — Other Ambulatory Visit: Payer: Self-pay | Admitting: Physical Medicine and Rehabilitation

## 2021-01-07 DIAGNOSIS — M79659 Pain in unspecified thigh: Secondary | ICD-10-CM

## 2021-01-07 DIAGNOSIS — R531 Weakness: Secondary | ICD-10-CM

## 2021-01-12 ENCOUNTER — Other Ambulatory Visit: Payer: Self-pay

## 2021-01-12 ENCOUNTER — Ambulatory Visit
Admission: RE | Admit: 2021-01-12 | Discharge: 2021-01-12 | Disposition: A | Discharge status: Home / Self Care | Payer: PPO | Source: Ambulatory Visit | Attending: Physical Medicine and Rehabilitation | Admitting: Physical Medicine and Rehabilitation

## 2021-01-12 DIAGNOSIS — R531 Weakness: Secondary | ICD-10-CM

## 2021-01-12 DIAGNOSIS — M79659 Pain in unspecified thigh: Secondary | ICD-10-CM

## 2021-01-12 DIAGNOSIS — M48061 Spinal stenosis, lumbar region without neurogenic claudication: Secondary | ICD-10-CM | POA: Diagnosis not present

## 2021-01-12 DIAGNOSIS — M545 Low back pain, unspecified: Secondary | ICD-10-CM | POA: Diagnosis not present

## 2021-01-14 DIAGNOSIS — M545 Low back pain, unspecified: Secondary | ICD-10-CM | POA: Diagnosis not present

## 2021-01-14 DIAGNOSIS — M461 Sacroiliitis, not elsewhere classified: Secondary | ICD-10-CM | POA: Diagnosis not present

## 2021-01-20 DIAGNOSIS — F339 Major depressive disorder, recurrent, unspecified: Secondary | ICD-10-CM | POA: Diagnosis not present

## 2021-01-20 DIAGNOSIS — Z7982 Long term (current) use of aspirin: Secondary | ICD-10-CM | POA: Diagnosis not present

## 2021-01-20 DIAGNOSIS — Z7901 Long term (current) use of anticoagulants: Secondary | ICD-10-CM | POA: Diagnosis not present

## 2021-01-20 DIAGNOSIS — I739 Peripheral vascular disease, unspecified: Secondary | ICD-10-CM | POA: Diagnosis not present

## 2021-01-20 DIAGNOSIS — E261 Secondary hyperaldosteronism: Secondary | ICD-10-CM | POA: Diagnosis not present

## 2021-01-20 DIAGNOSIS — Z6828 Body mass index (BMI) 28.0-28.9, adult: Secondary | ICD-10-CM | POA: Diagnosis not present

## 2021-01-20 DIAGNOSIS — D692 Other nonthrombocytopenic purpura: Secondary | ICD-10-CM | POA: Diagnosis not present

## 2021-01-20 DIAGNOSIS — I5030 Unspecified diastolic (congestive) heart failure: Secondary | ICD-10-CM | POA: Diagnosis not present

## 2021-01-20 DIAGNOSIS — Z955 Presence of coronary angioplasty implant and graft: Secondary | ICD-10-CM | POA: Diagnosis not present

## 2021-01-20 DIAGNOSIS — I25118 Atherosclerotic heart disease of native coronary artery with other forms of angina pectoris: Secondary | ICD-10-CM | POA: Diagnosis not present

## 2021-01-22 DIAGNOSIS — M461 Sacroiliitis, not elsewhere classified: Secondary | ICD-10-CM | POA: Diagnosis not present

## 2021-01-26 DIAGNOSIS — G4733 Obstructive sleep apnea (adult) (pediatric): Secondary | ICD-10-CM | POA: Diagnosis not present

## 2021-01-26 DIAGNOSIS — R0902 Hypoxemia: Secondary | ICD-10-CM | POA: Diagnosis not present

## 2021-02-10 DIAGNOSIS — I129 Hypertensive chronic kidney disease with stage 1 through stage 4 chronic kidney disease, or unspecified chronic kidney disease: Secondary | ICD-10-CM | POA: Diagnosis not present

## 2021-02-10 DIAGNOSIS — Z87891 Personal history of nicotine dependence: Secondary | ICD-10-CM | POA: Diagnosis not present

## 2021-02-10 DIAGNOSIS — J441 Chronic obstructive pulmonary disease with (acute) exacerbation: Secondary | ICD-10-CM | POA: Diagnosis not present

## 2021-02-10 DIAGNOSIS — E7849 Other hyperlipidemia: Secondary | ICD-10-CM | POA: Diagnosis not present

## 2021-02-10 DIAGNOSIS — N183 Chronic kidney disease, stage 3 unspecified: Secondary | ICD-10-CM | POA: Diagnosis not present

## 2021-02-26 DIAGNOSIS — N183 Chronic kidney disease, stage 3 unspecified: Secondary | ICD-10-CM | POA: Diagnosis not present

## 2021-02-26 DIAGNOSIS — K219 Gastro-esophageal reflux disease without esophagitis: Secondary | ICD-10-CM | POA: Diagnosis not present

## 2021-02-26 DIAGNOSIS — R0902 Hypoxemia: Secondary | ICD-10-CM | POA: Diagnosis not present

## 2021-02-26 DIAGNOSIS — S39012A Strain of muscle, fascia and tendon of lower back, initial encounter: Secondary | ICD-10-CM | POA: Diagnosis not present

## 2021-02-26 DIAGNOSIS — J449 Chronic obstructive pulmonary disease, unspecified: Secondary | ICD-10-CM | POA: Diagnosis not present

## 2021-02-26 DIAGNOSIS — E782 Mixed hyperlipidemia: Secondary | ICD-10-CM | POA: Diagnosis not present

## 2021-02-26 DIAGNOSIS — I10 Essential (primary) hypertension: Secondary | ICD-10-CM | POA: Diagnosis not present

## 2021-02-26 DIAGNOSIS — E7849 Other hyperlipidemia: Secondary | ICD-10-CM | POA: Diagnosis not present

## 2021-02-26 DIAGNOSIS — N289 Disorder of kidney and ureter, unspecified: Secondary | ICD-10-CM | POA: Diagnosis not present

## 2021-02-26 DIAGNOSIS — E039 Hypothyroidism, unspecified: Secondary | ICD-10-CM | POA: Diagnosis not present

## 2021-02-26 DIAGNOSIS — Z1159 Encounter for screening for other viral diseases: Secondary | ICD-10-CM | POA: Diagnosis not present

## 2021-02-26 DIAGNOSIS — G4733 Obstructive sleep apnea (adult) (pediatric): Secondary | ICD-10-CM | POA: Diagnosis not present

## 2021-03-01 DIAGNOSIS — I700 Atherosclerosis of aorta: Secondary | ICD-10-CM | POA: Diagnosis not present

## 2021-03-01 DIAGNOSIS — I10 Essential (primary) hypertension: Secondary | ICD-10-CM | POA: Diagnosis not present

## 2021-03-01 DIAGNOSIS — E7849 Other hyperlipidemia: Secondary | ICD-10-CM | POA: Diagnosis not present

## 2021-03-01 DIAGNOSIS — M25552 Pain in left hip: Secondary | ICD-10-CM | POA: Diagnosis not present

## 2021-03-01 DIAGNOSIS — I25111 Atherosclerotic heart disease of native coronary artery with angina pectoris with documented spasm: Secondary | ICD-10-CM | POA: Diagnosis not present

## 2021-03-01 DIAGNOSIS — Z23 Encounter for immunization: Secondary | ICD-10-CM | POA: Diagnosis not present

## 2021-03-01 DIAGNOSIS — Z0001 Encounter for general adult medical examination with abnormal findings: Secondary | ICD-10-CM | POA: Diagnosis not present

## 2021-03-01 DIAGNOSIS — R270 Ataxia, unspecified: Secondary | ICD-10-CM | POA: Diagnosis not present

## 2021-03-05 DIAGNOSIS — M7062 Trochanteric bursitis, left hip: Secondary | ICD-10-CM | POA: Diagnosis not present

## 2021-03-12 DIAGNOSIS — E7849 Other hyperlipidemia: Secondary | ICD-10-CM | POA: Diagnosis not present

## 2021-03-12 DIAGNOSIS — J441 Chronic obstructive pulmonary disease with (acute) exacerbation: Secondary | ICD-10-CM | POA: Diagnosis not present

## 2021-03-12 DIAGNOSIS — N183 Chronic kidney disease, stage 3 unspecified: Secondary | ICD-10-CM | POA: Diagnosis not present

## 2021-03-12 DIAGNOSIS — Z87891 Personal history of nicotine dependence: Secondary | ICD-10-CM | POA: Diagnosis not present

## 2021-03-12 DIAGNOSIS — I129 Hypertensive chronic kidney disease with stage 1 through stage 4 chronic kidney disease, or unspecified chronic kidney disease: Secondary | ICD-10-CM | POA: Diagnosis not present

## 2021-03-14 DIAGNOSIS — L089 Local infection of the skin and subcutaneous tissue, unspecified: Secondary | ICD-10-CM | POA: Diagnosis not present

## 2021-03-14 DIAGNOSIS — S92416A Nondisplaced fracture of proximal phalanx of unspecified great toe, initial encounter for closed fracture: Secondary | ICD-10-CM | POA: Diagnosis not present

## 2021-03-28 DIAGNOSIS — G4733 Obstructive sleep apnea (adult) (pediatric): Secondary | ICD-10-CM | POA: Diagnosis not present

## 2021-03-28 DIAGNOSIS — R0902 Hypoxemia: Secondary | ICD-10-CM | POA: Diagnosis not present

## 2021-04-15 DIAGNOSIS — M545 Low back pain, unspecified: Secondary | ICD-10-CM | POA: Diagnosis not present

## 2021-04-16 ENCOUNTER — Other Ambulatory Visit: Payer: Self-pay

## 2021-04-16 ENCOUNTER — Encounter: Payer: Self-pay | Admitting: Podiatry

## 2021-04-16 ENCOUNTER — Ambulatory Visit: Payer: PPO | Admitting: Podiatry

## 2021-04-16 DIAGNOSIS — M79675 Pain in left toe(s): Secondary | ICD-10-CM | POA: Diagnosis not present

## 2021-04-16 DIAGNOSIS — M79674 Pain in right toe(s): Secondary | ICD-10-CM | POA: Diagnosis not present

## 2021-04-16 DIAGNOSIS — B351 Tinea unguium: Secondary | ICD-10-CM | POA: Diagnosis not present

## 2021-04-21 ENCOUNTER — Encounter: Payer: Self-pay | Admitting: Podiatry

## 2021-04-21 NOTE — Progress Notes (Signed)
  Subjective:  Patient ID: Stephanie Alvarado, female    DOB: 04-Aug-1940,  MRN: [id number]  Chief Complaint  Patient presents with   Nail Problem    Nail trim    80 y.o. female returns for the above complaint.  Patient presents with thickened elongated dystrophic toenails x10.  Pain on palpation.  Patient states that it is painful to walk but she would like for her to debride out she is noted to do her self.  She denies any other acute issues.  Objective:  There were no vitals filed for this visit. Podiatric Exam: Vascular: dorsalis pedis and posterior tibial pulses are palpable bilateral. Capillary return is immediate. Temperature gradient is WNL. Skin turgor WNL  Sensorium: Normal Semmes Weinstein monofilament test. Normal tactile sensation bilaterally. Nail Exam: Pt has thick disfigured discolored nails with subungual debris noted bilateral entire nail hallux through fifth toenails.  Pain on palpation to the nails. Ulcer Exam: There is no evidence of ulcer or pre-ulcerative changes or infection. Orthopedic Exam: Muscle tone and strength are WNL. No limitations in general ROM. No crepitus or effusions noted.  Skin: No Porokeratosis. No infection or ulcers    Assessment & Plan:   1. Pain due to onychomycosis of toenails of both feet     Patient was evaluated and treated and all questions answered.  Onychomycosis with pain  -Nails palliatively debrided as below. -Educated on self-care  Procedure: Nail Debridement Rationale: pain  Type of Debridement: manual, sharp debridement. Instrumentation: Nail nipper, rotary burr. Number of Nails: 10  Procedures and Treatment: Consent by patient was obtained for treatment procedures. The patient understood the discussion of treatment and procedures well. All questions were answered thoroughly reviewed. Debridement of mycotic and hypertrophic toenails, 1 through 5 bilateral and clearing of subungual debris. No ulceration, no infection noted.   Return Visit-Office Procedure: Patient instructed to return to the office for a follow up visit 3 months for continued evaluation and treatment.  Boneta Lucks, DPM    Return in about 3 months (around 07/17/2021) for GALAWAY .

## 2021-04-28 DIAGNOSIS — R0902 Hypoxemia: Secondary | ICD-10-CM | POA: Diagnosis not present

## 2021-04-28 DIAGNOSIS — G4733 Obstructive sleep apnea (adult) (pediatric): Secondary | ICD-10-CM | POA: Diagnosis not present

## 2021-05-12 DIAGNOSIS — J441 Chronic obstructive pulmonary disease with (acute) exacerbation: Secondary | ICD-10-CM | POA: Diagnosis not present

## 2021-05-12 DIAGNOSIS — N183 Chronic kidney disease, stage 3 unspecified: Secondary | ICD-10-CM | POA: Diagnosis not present

## 2021-05-12 DIAGNOSIS — Z87891 Personal history of nicotine dependence: Secondary | ICD-10-CM | POA: Diagnosis not present

## 2021-05-12 DIAGNOSIS — I129 Hypertensive chronic kidney disease with stage 1 through stage 4 chronic kidney disease, or unspecified chronic kidney disease: Secondary | ICD-10-CM | POA: Diagnosis not present

## 2021-05-12 DIAGNOSIS — E7849 Other hyperlipidemia: Secondary | ICD-10-CM | POA: Diagnosis not present

## 2021-05-22 DIAGNOSIS — R600 Localized edema: Secondary | ICD-10-CM | POA: Diagnosis not present

## 2021-05-22 DIAGNOSIS — M79674 Pain in right toe(s): Secondary | ICD-10-CM | POA: Diagnosis not present

## 2021-05-26 DIAGNOSIS — M79674 Pain in right toe(s): Secondary | ICD-10-CM | POA: Diagnosis not present

## 2021-05-28 DIAGNOSIS — G4733 Obstructive sleep apnea (adult) (pediatric): Secondary | ICD-10-CM | POA: Diagnosis not present

## 2021-05-28 DIAGNOSIS — R0902 Hypoxemia: Secondary | ICD-10-CM | POA: Diagnosis not present

## 2021-06-24 ENCOUNTER — Encounter: Payer: Self-pay | Admitting: Sports Medicine

## 2021-06-24 ENCOUNTER — Ambulatory Visit: Payer: PPO | Admitting: Sports Medicine

## 2021-06-24 ENCOUNTER — Other Ambulatory Visit: Payer: Self-pay

## 2021-06-24 DIAGNOSIS — L97511 Non-pressure chronic ulcer of other part of right foot limited to breakdown of skin: Secondary | ICD-10-CM

## 2021-06-24 DIAGNOSIS — M2041 Other hammer toe(s) (acquired), right foot: Secondary | ICD-10-CM | POA: Diagnosis not present

## 2021-06-24 MED ORDER — BACTROBAN NASAL 2 % NA OINT: TOPICAL_OINTMENT | NASAL | 0 refills | Status: DC

## 2021-06-24 MED ORDER — CEPHALEXIN 500 MG PO CAPS: 500.0000 mg | ORAL_CAPSULE | Freq: Three times a day (TID) | ORAL | 0 refills | Status: DC

## 2021-06-24 NOTE — Progress Notes (Addendum)
Subjective: Stephanie Alvarado is a 81 y.o. female patient seen in office for concerning blister to the right third toe.  Patient reports that she had minor drainage and swelling from the toe and a lot of pain states that she started soaking with Epson salt and this is helped since Saturday.  Patient denies night sweats/shortness of breath/fever or any other constitutional symptoms at this time.  Patient has no other pedal complaints at this time.  Patient Active Problem List   Diagnosis Date Noted   Primary osteoarthritis of left knee 12/23/2019   Diarrhea 09/06/2017   Chronic RLQ pain 05/26/2017   Lightheadedness    Ataxia 05/30/2016   Dizziness 05/30/2016   Preoperative clearance 11/10/2015   Varicose veins of left lower extremity with complications 62/22/9798   Varicose veins of leg with complications 92/04/9416   Lumbar scoliosis 11/21/2013   Preop cardiovascular exam 10/29/2013   LLQ abdominal pain 04/05/2013   Diverticulitis 04/05/2013   Palpitations    Subclavian artery disease (HCC)    PFO (patent foramen ovale)    CAD (coronary artery disease)    Ejection fraction    Atherosclerosis of aorta (HCC)    Carotid artery disease (HCC)    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   Chronic constipation 06/11/2010   ABDOMINAL PAIN-LLQ 06/11/2010   History of adenomatous polyp of colon 06/11/2010   SLEEP RELATED HYPOVENTILATION/HYPOXEMIA CCE 07/17/2009   Stroke ([place]) 11/11/2008   COLONIC POLYPS 08/31/2008   THYROID CYST 08/31/2008   VENOUS INSUFFICIENCY 08/31/2008   DIVERTICULOSIS OF COLON 08/31/2008   LOW BACK PAIN, CHRONIC 08/31/2008   FIBROMYALGIA 08/31/2008   Dyspnea 08/08/2008   HYPERCHOLESTEROLEMIA 04/19/2007   OBESITY 04/19/2007   ANEMIA 04/19/2007   ANXIETY 04/19/2007   Essential hypertension 04/19/2007   ALLERGIC RHINITIS 04/19/2007   PULMONARY NODULE, RIGHT LOWER LOBE 04/19/2007   GERD 04/19/2007   OA (osteoarthritis) of knee 04/19/2007   Current Outpatient  Medications on File Prior to Visit  Medication Sig Dispense Refill   amLODipine (NORVASC) 5 MG tablet Take 5 mg by mouth every morning.     bisacodyl (DULCOLAX) 10 MG suppository Place 1 suppository (10 mg total) rectally daily as needed for moderate constipation. 12 suppository 0   buPROPion (WELLBUTRIN XL) 150 MG 24 hr tablet Take 300 mg by mouth daily.     docusate sodium (COLACE) 100 MG capsule Take 100 mg by mouth daily as needed for mild constipation.     estradiol (ESTRACE) 0.1 MG/GM vaginal cream Place vaginally.     furosemide (LASIX) 40 MG tablet Take 40 mg by mouth daily as needed for edema.     gabapentin (NEURONTIN) 300 MG capsule Take 600 mg by mouth 3 (three) times daily.      isosorbide mononitrate (IMDUR) 30 MG 24 hr tablet Take 30 mg by mouth at bedtime.      linaclotide (LINZESS) 145 MCG CAPS capsule Take 1 capsule (145 mcg total) by mouth daily before breakfast. 90 capsule 3   Melatonin 10 MG TABS Take by mouth.     metoprolol tartrate (LOPRESSOR) 50 MG tablet Take 50 mg by mouth 2 (two) times daily.      Multiple Vitamin (MULTIVITAMIN) capsule Take 1 capsule by mouth daily.     nitroGLYCERIN (NITROSTAT) 0.4 MG SL tablet Place 0.4 mg under the tongue every 5 (five) minutes as needed for chest pain (TAKE UP TO 3 DOSES BEFORE CALLING 911).     pantoprazole (PROTONIX) 40 MG tablet Take 40  mg by mouth 2 (two) times daily.     PFIZER COVID-19 VAC BIVALENT injection      polyethylene glycol powder (GLYCOLAX/MIRALAX) powder Take 17 g by mouth daily as needed for moderate constipation.      polyvinyl alcohol (LIQUIFILM TEARS) 1.4 % ophthalmic solution Place 1 drop into both eyes as needed for dry eyes.     rosuvastatin (CRESTOR) 10 MG tablet Take 10 mg by mouth at bedtime.     Sodium Sulfate-Mag Sulfate-KCl (SUTAB) [id number] MG TABS Take 1 kit by mouth as directed. MANUFACTURER CODES!! BIN: [id number] PCN: CN GROUP: [id number] MEMBER ID: [id number];[employer] SECONDARY INSURANCE ;NO PRIOR  AUTHORIZATION 24 tablet 0   No current facility-administered medications on file prior to visit.   Allergies  Allergen Reactions   Codeine Itching    REACTION: itch    No results found for this or any previous visit (from the past 2160 hour(s)).  Objective: There were no vitals filed for this visit.  General: Patient is awake, alert, oriented x 3 and in no acute distress.  Dermatology: Skin is warm and dry bilateral with a partial thickness ulceration present distal tuft of the right third toe. Ulceration measures 0.3 cm x 0.2 cm x 0.1 cm. There is a keratotic border with a granular base. The ulceration does not probe to bone. There is no malodor, no active drainage, blanchable erythema, minimal edema. No acute signs of infection.   Vascular: Dorsalis Pedis pulse = 1/4 Bilateral,  Posterior Tibial pulse = 1/4 Bilateral,  Capillary Fill Time < 5 seconds  Neurologic: Patient is hypersensitive to light touch bilateral pain is worse at the right third toe.  Musculosketal: There is pain to ulcerated area at right third toe.  Hammertoe deformity noted.    No results for input(s): GRAMSTAIN, LABORGA in the last 8760 hours.  Assessment and Plan:  Problem List Items Addressed This Visit   None Visit Diagnoses     Toe ulcer, right, limited to breakdown of skin ([place])    -  Primary   Hammer toe of right foot            -Examined patient and discussed the progression of the wound and treatment alternatives. - Excisionally dedbrided ulceration at right third toe to healthy bleeding borders removing nonviable tissue using a sterile chisel blade. Wound measures post debridement as above. Wound was debrided to the level of the dermis with viable wound base exposed to promote healing. Hemostasis was achieved with manuel pressure. Patient tolerated procedure well without any discomfort or anesthesia necessary for this wound debridement.  -Applied antibiotic cream and bandaid/dry sterile  dressing and advised patient to do the same daily.  Prescribed Bactroban cream for patient to use to the area -May continue with Epson salt soaking to help with pain and to help the wound dry up and heal as directed for the next week. -Prescribed Keflex for preventative measures due to pain, swelling, and blanchable erythema at the right third toe -Advised patient to avoid shoes that could rub toe - Advised patient to go to the ER or return to office if the wound worsens or if constitutional symptoms are present. -Patient to return to office in 2 to 3 weeks for follow up care and evaluation or sooner if problems arise.  Landis Martins, DPM

## 2021-06-28 DIAGNOSIS — R0902 Hypoxemia: Secondary | ICD-10-CM | POA: Diagnosis not present

## 2021-06-28 DIAGNOSIS — G4733 Obstructive sleep apnea (adult) (pediatric): Secondary | ICD-10-CM | POA: Diagnosis not present

## 2021-07-01 DIAGNOSIS — E7849 Other hyperlipidemia: Secondary | ICD-10-CM | POA: Diagnosis not present

## 2021-07-01 DIAGNOSIS — R5383 Other fatigue: Secondary | ICD-10-CM | POA: Diagnosis not present

## 2021-07-01 DIAGNOSIS — J9611 Chronic respiratory failure with hypoxia: Secondary | ICD-10-CM | POA: Diagnosis not present

## 2021-07-01 DIAGNOSIS — D696 Thrombocytopenia, unspecified: Secondary | ICD-10-CM | POA: Diagnosis not present

## 2021-07-01 DIAGNOSIS — Z0001 Encounter for general adult medical examination with abnormal findings: Secondary | ICD-10-CM | POA: Diagnosis not present

## 2021-07-01 DIAGNOSIS — E782 Mixed hyperlipidemia: Secondary | ICD-10-CM | POA: Diagnosis not present

## 2021-07-01 DIAGNOSIS — I25111 Atherosclerotic heart disease of native coronary artery with angina pectoris with documented spasm: Secondary | ICD-10-CM | POA: Diagnosis not present

## 2021-07-01 DIAGNOSIS — Z23 Encounter for immunization: Secondary | ICD-10-CM | POA: Diagnosis not present

## 2021-07-01 DIAGNOSIS — J449 Chronic obstructive pulmonary disease, unspecified: Secondary | ICD-10-CM | POA: Diagnosis not present

## 2021-07-01 DIAGNOSIS — E039 Hypothyroidism, unspecified: Secondary | ICD-10-CM | POA: Diagnosis not present

## 2021-07-01 DIAGNOSIS — I10 Essential (primary) hypertension: Secondary | ICD-10-CM | POA: Diagnosis not present

## 2021-07-01 DIAGNOSIS — I8391 Asymptomatic varicose veins of right lower extremity: Secondary | ICD-10-CM | POA: Diagnosis not present

## 2021-07-01 DIAGNOSIS — N183 Chronic kidney disease, stage 3 unspecified: Secondary | ICD-10-CM | POA: Diagnosis not present

## 2021-07-02 DIAGNOSIS — H0102A Squamous blepharitis right eye, upper and lower eyelids: Secondary | ICD-10-CM | POA: Diagnosis not present

## 2021-07-02 DIAGNOSIS — H16223 Keratoconjunctivitis sicca, not specified as Sjogren's, bilateral: Secondary | ICD-10-CM | POA: Diagnosis not present

## 2021-07-02 DIAGNOSIS — H40023 Open angle with borderline findings, high risk, bilateral: Secondary | ICD-10-CM | POA: Diagnosis not present

## 2021-07-02 DIAGNOSIS — H52223 Regular astigmatism, bilateral: Secondary | ICD-10-CM | POA: Diagnosis not present

## 2021-07-02 DIAGNOSIS — H43813 Vitreous degeneration, bilateral: Secondary | ICD-10-CM | POA: Diagnosis not present

## 2021-07-06 ENCOUNTER — Other Ambulatory Visit: Payer: Self-pay

## 2021-07-06 ENCOUNTER — Emergency Department (HOSPITAL_COMMUNITY): Payer: PPO

## 2021-07-06 ENCOUNTER — Inpatient Hospital Stay (HOSPITAL_COMMUNITY)
Admission: EM | Admit: 2021-07-06 | Discharge: 2021-07-19 | DRG: 391 | Disposition: A | Discharge status: Home Health | Payer: PPO | Attending: Internal Medicine | Admitting: Internal Medicine

## 2021-07-06 ENCOUNTER — Encounter (HOSPITAL_COMMUNITY): Payer: Self-pay | Admitting: Emergency Medicine

## 2021-07-06 DIAGNOSIS — R0902 Hypoxemia: Secondary | ICD-10-CM | POA: Diagnosis not present

## 2021-07-06 DIAGNOSIS — R079 Chest pain, unspecified: Secondary | ICD-10-CM | POA: Diagnosis not present

## 2021-07-06 DIAGNOSIS — Z9981 Dependence on supplemental oxygen: Secondary | ICD-10-CM | POA: Diagnosis not present

## 2021-07-06 DIAGNOSIS — K5712 Diverticulitis of small intestine without perforation or abscess without bleeding: Secondary | ICD-10-CM | POA: Diagnosis not present

## 2021-07-06 DIAGNOSIS — I10 Essential (primary) hypertension: Secondary | ICD-10-CM | POA: Diagnosis not present

## 2021-07-06 DIAGNOSIS — I2510 Atherosclerotic heart disease of native coronary artery without angina pectoris: Secondary | ICD-10-CM | POA: Diagnosis present

## 2021-07-06 DIAGNOSIS — Z20822 Contact with and (suspected) exposure to covid-19: Secondary | ICD-10-CM | POA: Diagnosis not present

## 2021-07-06 DIAGNOSIS — E041 Nontoxic single thyroid nodule: Secondary | ICD-10-CM | POA: Diagnosis not present

## 2021-07-06 DIAGNOSIS — I5032 Chronic diastolic (congestive) heart failure: Secondary | ICD-10-CM

## 2021-07-06 DIAGNOSIS — R109 Unspecified abdominal pain: Secondary | ICD-10-CM | POA: Diagnosis not present

## 2021-07-06 DIAGNOSIS — Z79899 Other long term (current) drug therapy: Secondary | ICD-10-CM

## 2021-07-06 DIAGNOSIS — R0602 Shortness of breath: Secondary | ICD-10-CM | POA: Diagnosis not present

## 2021-07-06 DIAGNOSIS — Z79890 Hormone replacement therapy (postmenopausal): Secondary | ICD-10-CM

## 2021-07-06 DIAGNOSIS — E876 Hypokalemia: Secondary | ICD-10-CM | POA: Diagnosis not present

## 2021-07-06 DIAGNOSIS — I110 Hypertensive heart disease with heart failure: Secondary | ICD-10-CM | POA: Diagnosis present

## 2021-07-06 DIAGNOSIS — E871 Hypo-osmolality and hyponatremia: Secondary | ICD-10-CM | POA: Diagnosis not present

## 2021-07-06 DIAGNOSIS — Z955 Presence of coronary angioplasty implant and graft: Secondary | ICD-10-CM

## 2021-07-06 DIAGNOSIS — Z820 Family history of epilepsy and other diseases of the nervous system: Secondary | ICD-10-CM

## 2021-07-06 DIAGNOSIS — R400 Somnolence: Secondary | ICD-10-CM | POA: Diagnosis not present

## 2021-07-06 DIAGNOSIS — K449 Diaphragmatic hernia without obstruction or gangrene: Secondary | ICD-10-CM | POA: Diagnosis not present

## 2021-07-06 DIAGNOSIS — N179 Acute kidney failure, unspecified: Secondary | ICD-10-CM | POA: Diagnosis not present

## 2021-07-06 DIAGNOSIS — I739 Peripheral vascular disease, unspecified: Secondary | ICD-10-CM | POA: Diagnosis not present

## 2021-07-06 DIAGNOSIS — J449 Chronic obstructive pulmonary disease, unspecified: Secondary | ICD-10-CM | POA: Diagnosis not present

## 2021-07-06 DIAGNOSIS — I2692 Saddle embolus of pulmonary artery without acute cor pulmonale: Secondary | ICD-10-CM

## 2021-07-06 DIAGNOSIS — R1031 Right lower quadrant pain: Secondary | ICD-10-CM | POA: Diagnosis not present

## 2021-07-06 DIAGNOSIS — K5732 Diverticulitis of large intestine without perforation or abscess without bleeding: Secondary | ICD-10-CM | POA: Diagnosis not present

## 2021-07-06 DIAGNOSIS — J90 Pleural effusion, not elsewhere classified: Secondary | ICD-10-CM | POA: Diagnosis not present

## 2021-07-06 DIAGNOSIS — I82441 Acute embolism and thrombosis of right tibial vein: Secondary | ICD-10-CM | POA: Diagnosis not present

## 2021-07-06 DIAGNOSIS — Z96653 Presence of artificial knee joint, bilateral: Secondary | ICD-10-CM | POA: Diagnosis present

## 2021-07-06 DIAGNOSIS — I2699 Other pulmonary embolism without acute cor pulmonale: Secondary | ICD-10-CM | POA: Diagnosis not present

## 2021-07-06 DIAGNOSIS — J9621 Acute and chronic respiratory failure with hypoxia: Secondary | ICD-10-CM | POA: Diagnosis not present

## 2021-07-06 DIAGNOSIS — E538 Deficiency of other specified B group vitamins: Secondary | ICD-10-CM | POA: Diagnosis present

## 2021-07-06 DIAGNOSIS — D696 Thrombocytopenia, unspecified: Secondary | ICD-10-CM | POA: Diagnosis not present

## 2021-07-06 DIAGNOSIS — E8729 Other acidosis: Secondary | ICD-10-CM | POA: Diagnosis not present

## 2021-07-06 DIAGNOSIS — J9611 Chronic respiratory failure with hypoxia: Secondary | ICD-10-CM | POA: Diagnosis not present

## 2021-07-06 DIAGNOSIS — E785 Hyperlipidemia, unspecified: Secondary | ICD-10-CM | POA: Diagnosis present

## 2021-07-06 DIAGNOSIS — Z87891 Personal history of nicotine dependence: Secondary | ICD-10-CM | POA: Diagnosis not present

## 2021-07-06 DIAGNOSIS — I2609 Other pulmonary embolism with acute cor pulmonale: Secondary | ICD-10-CM | POA: Diagnosis not present

## 2021-07-06 DIAGNOSIS — G4733 Obstructive sleep apnea (adult) (pediatric): Secondary | ICD-10-CM | POA: Diagnosis not present

## 2021-07-06 DIAGNOSIS — Z8249 Family history of ischemic heart disease and other diseases of the circulatory system: Secondary | ICD-10-CM

## 2021-07-06 DIAGNOSIS — R1084 Generalized abdominal pain: Secondary | ICD-10-CM | POA: Diagnosis not present

## 2021-07-06 DIAGNOSIS — R918 Other nonspecific abnormal finding of lung field: Secondary | ICD-10-CM

## 2021-07-06 DIAGNOSIS — N281 Cyst of kidney, acquired: Secondary | ICD-10-CM | POA: Diagnosis not present

## 2021-07-06 DIAGNOSIS — K5792 Diverticulitis of intestine, part unspecified, without perforation or abscess without bleeding: Secondary | ICD-10-CM | POA: Diagnosis not present

## 2021-07-06 DIAGNOSIS — Z800 Family history of malignant neoplasm of digestive organs: Secondary | ICD-10-CM

## 2021-07-06 DIAGNOSIS — R000 Tachycardia, unspecified: Secondary | ICD-10-CM | POA: Diagnosis not present

## 2021-07-06 DIAGNOSIS — Z8673 Personal history of transient ischemic attack (TIA), and cerebral infarction without residual deficits: Secondary | ICD-10-CM

## 2021-07-06 DIAGNOSIS — I700 Atherosclerosis of aorta: Secondary | ICD-10-CM | POA: Diagnosis not present

## 2021-07-06 LAB — COMPREHENSIVE METABOLIC PANEL
ALT: 14 U/L (ref 0–44)
AST: 17 U/L (ref 15–41)
Albumin: 4.1 g/dL (ref 3.5–5.0)
Alkaline Phosphatase: 75 U/L (ref 38–126)
Anion gap: 6 (ref 5–15)
BUN: 14 mg/dL (ref 8–23)
CO2: 28 mmol/L (ref 22–32)
Calcium: 9.4 mg/dL (ref 8.9–10.3)
Chloride: 103 mmol/L (ref 98–111)
Creatinine, Ser: 0.71 mg/dL (ref 0.44–1.00)
GFR, Estimated: >60 mL/min (ref >60)
Glucose, Bld: 135 mg/dL — ABNORMAL HIGH (ref 70–99)
Potassium: 3.2 mmol/L — ABNORMAL LOW (ref 3.5–5.1)
Sodium: 137 mmol/L (ref 135–145)
Total Bilirubin: 0.6 mg/dL (ref 0.3–1.2)
Total Protein: 7.8 g/dL (ref 6.5–8.1)

## 2021-07-06 LAB — URINALYSIS, ROUTINE W REFLEX MICROSCOPIC
Bacteria, UA: NONE SEEN
Bilirubin Urine: NEGATIVE
Glucose, UA: NEGATIVE mg/dL
Ketones, ur: 5 mg/dL — AB
Leukocytes,Ua: NEGATIVE
Nitrite: NEGATIVE
Protein, ur: NEGATIVE mg/dL
Specific Gravity, Urine: 1.008 (ref 1.005–1.030)
pH: 8 (ref 5.0–8.0)

## 2021-07-06 LAB — CBC
HCT: 42.3 % (ref 36.0–46.0)
Hemoglobin: 13 g/dL (ref 12.0–15.0)
MCH: 27.8 pg (ref 26.0–34.0)
MCHC: 30.7 g/dL (ref 30.0–36.0)
MCV: 90.4 fL (ref 80.0–100.0)
Platelets: 130 10*3/uL — ABNORMAL LOW (ref 150–400)
RBC: 4.68 MIL/uL (ref 3.87–5.11)
RDW: 17.9 % — ABNORMAL HIGH (ref 11.5–15.5)
WBC: 7.6 10*3/uL (ref 4.0–10.5)
nRBC: 0 % (ref 0.0–0.2)

## 2021-07-06 LAB — I-STAT BETA HCG BLOOD, ED (MC, WL, AP ONLY): I-stat hCG, quantitative: <5 m[IU]/mL (ref <5)

## 2021-07-06 LAB — LIPASE, BLOOD: Lipase: 25 U/L (ref 11–51)

## 2021-07-06 MED ORDER — MORPHINE SULFATE (PF) 4 MG/ML IV SOLN
4.0000 mg | Freq: Once | INTRAVENOUS | Status: AC
  Administered 2021-07-06: 22:00:00 4 mg via INTRAVENOUS
  Filled 2021-07-06: qty 1

## 2021-07-06 MED ORDER — DROPERIDOL 2.5 MG/ML IJ SOLN
1.2500 mg | Freq: Once | INTRAMUSCULAR | Status: AC
  Administered 2021-07-07: 1.25 mg via INTRAVENOUS
  Filled 2021-07-06: qty 2

## 2021-07-06 MED ORDER — DIPHENHYDRAMINE HCL 50 MG/ML IJ SOLN
12.5000 mg | Freq: Once | INTRAMUSCULAR | Status: AC
  Administered 2021-07-07: 12.5 mg via INTRAVENOUS
  Filled 2021-07-06: qty 1

## 2021-07-06 MED ORDER — IOHEXOL 300 MG/ML  SOLN
100.0000 mL | Freq: Once | INTRAMUSCULAR | Status: AC | PRN
  Administered 2021-07-06: 23:00:00 100 mL via INTRAVENOUS

## 2021-07-06 MED ORDER — ONDANSETRON HCL 4 MG/2ML IJ SOLN
4.0000 mg | Freq: Once | INTRAMUSCULAR | Status: AC
  Administered 2021-07-06: 22:00:00 4 mg via INTRAVENOUS
  Filled 2021-07-06: qty 2

## 2021-07-06 MED ORDER — AMOXICILLIN-POT CLAVULANATE 875-125 MG PO TABS
1.0000 | ORAL_TABLET | Freq: Once | ORAL | Status: AC
  Administered 2021-07-07: 1 via ORAL
  Filled 2021-07-06: qty 1

## 2021-07-06 NOTE — ED Provider Notes (Signed)
°  Provider Note MRN:  [id number]  Arrival date & time: 07/07/21    ED Course and Medical Decision Making  Assumed care from Dr. Tinnie Gens at shift change.  Acute lower abdominal pain awaiting CT imaging.  Hemodynamically stable, labs reassuring thus far.  CT revealing diverticulitis without complicating features.  Despite droperidol and multiple doses of morphine patient has continued severe pain.  Will admit to medicine.  Procedures  Final Clinical Impressions(s) / ED Diagnoses     ICD-10-CM   1. Diverticulitis  K57.92       ED Discharge Orders     None       Discharge Instructions   None     Barth Kirks. [place]onia Small, [place] [email or web address], MD 07/07/21 [phone]

## 2021-07-06 NOTE — ED Notes (Signed)
Pt placed on 2L oxygen by nasal cannula due to oxygen dropping to 88. Pt has hx of COPD and has PRN oxygen for home use

## 2021-07-06 NOTE — ED Triage Notes (Signed)
Pt arrived via EMS from home. Pt has right lower quadrant pain which started around 2pm today. Pt has hx of hernia. Pt has had 5mcg of fentanyl. Pt reports two emesis events before EMS arrival. Pt has hx of COPD and wears oxygen as needed at home.

## 2021-07-06 NOTE — ED Provider Notes (Signed)
[facility] DEPT Provider Note  CSN: [id number] Arrival date & time: 07/06/21 2119  Chief Complaint(s) Abdominal Pain  HPI Stephanie Alvarado is a 81 y.o. female who presents emergency department for evaluation of abdominal pain.  Patient states that she had sudden onset right lower quadrant abdominal pain today with associated nausea and vomiting.  She states that her pain is 10 out of 10 and nonradiating.  Denies chest pain, shortness of breath, headache, cough, fever or other systemic symptoms.  Denies dysuria or diarrhea.   Abdominal Pain Associated symptoms: nausea and vomiting    Past Medical History Past Medical History:  Diagnosis Date   Achilles tendon rupture, right, initial encounter    Allergic rhinitis, cause unspecified    Anemia, unspecified    Anxiety state, unspecified    Atherosclerosis of aorta ([place])    TEE, June, 2010, grade 4 aortic arch atherosclerosis , Dr. Aundra Dubin   CAD (coronary artery disease)    DES and circumflex 2006 /  DES to LAD 2011   Carotid artery disease ([place])    Doppler, November, 2011, stable, 0-39% bilateral, turbulent flow left subclavian   Chronic diastolic heart failure (HCC)    Cyst of thyroid    Diverticulosis of colon (without mention of hemorrhage)    Dysphasia    Possibly esophageal stricture   Ejection fraction     EF 60%, Echo, November 12, 2008, /  EF 55%, TEE, November 17, 2008   Esophageal reflux    Hiatal hernia    Hyperlipemia    Hypertension    Lumbago    Lumbar disc disease    MVP (mitral valve prolapse)    Neuropathy    Nonspecific abnormal finding in stool contents    Obesity, unspecified    Osteoarthrosis, unspecified whether generalized or localized, unspecified site    Osteopenia    Other diseases of lung, not elsewhere classified    Palpitations    October, 2012   Peripheral vascular disease, unspecified ([place])    Personal history of colonic polyps    ADENOMATOUS POLYP   PFO (patent foramen  ovale)    Patient had a very small PFO by TEE 2010.  This was seen only by bubble analysis during Valsalva   PONV (postoperative nausea and vomiting)    Shortness of breath    Sleep apnea    uses CPAP nightly   Sleep related hypoventilation/hypoxemia in conditions classifiable elsewhere    Stroke [facility]) 11/2008   Treated with TPA? 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 Victorino Dike.   Unspecified cerebral artery occlusion with cerebral infarction    Unspecified venous (peripheral) insufficiency    Vitamin B 12 deficiency    Patient Active Problem List   Diagnosis Date Noted   Primary osteoarthritis of left knee 12/23/2019   Diarrhea 09/06/2017   Chronic RLQ pain 05/26/2017   Lightheadedness    Ataxia 05/30/2016   Dizziness 05/30/2016   Preoperative clearance 11/10/2015   Varicose veins of left lower extremity with complications 55/37/4827   Varicose veins of leg with complications 07/86/7544   Lumbar scoliosis 11/21/2013   Preop cardiovascular exam 10/29/2013   LLQ abdominal pain 04/05/2013   Diverticulitis 04/05/2013   Palpitations    Subclavian artery disease (HCC)    PFO (patent foramen ovale)    CAD (coronary artery disease)  Ejection fraction    Atherosclerosis of aorta (HCC)    Carotid artery disease ([place])    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   Chronic constipation 06/11/2010   ABDOMINAL PAIN-LLQ 06/11/2010   History of adenomatous polyp of colon 06/11/2010   SLEEP RELATED HYPOVENTILATION/HYPOXEMIA CCE 07/17/2009   Stroke ([place]) 11/11/2008   COLONIC POLYPS 08/31/2008   THYROID CYST 08/31/2008   VENOUS INSUFFICIENCY 08/31/2008   DIVERTICULOSIS OF COLON 08/31/2008   LOW BACK PAIN, CHRONIC 08/31/2008   FIBROMYALGIA 08/31/2008   Dyspnea 08/08/2008   HYPERCHOLESTEROLEMIA 04/19/2007   OBESITY 04/19/2007   ANEMIA 04/19/2007   ANXIETY 04/19/2007   Essential  hypertension 04/19/2007   ALLERGIC RHINITIS 04/19/2007   PULMONARY NODULE, RIGHT LOWER LOBE 04/19/2007   GERD 04/19/2007   OA (osteoarthritis) of knee 04/19/2007   Home Medication(s) Prior to Admission medications   Medication Sig Start Date End Date Taking? Authorizing Provider  amLODipine (NORVASC) 5 MG tablet Take 5 mg by mouth every morning. 05/17/21  Yes [provider]  buPROPion (WELLBUTRIN XL) 150 MG 24 hr tablet Take 300 mg by mouth daily. 10/31/15  Yes [provider]  docusate sodium (COLACE) 100 MG capsule Take 100 mg by mouth daily as needed for mild constipation.   Yes [provider]  gabapentin (NEURONTIN) 300 MG capsule Take 600 mg by mouth 3 (three) times daily.  10/31/15  Yes [provider]  isosorbide mononitrate (IMDUR) 30 MG 24 hr tablet Take 30 mg by mouth at bedtime.    Yes [provider]  metoprolol tartrate (LOPRESSOR) 50 MG tablet Take 50 mg by mouth 2 (two) times daily.    Yes [provider]  Multiple Vitamin (MULTIVITAMIN) capsule Take 1 capsule by mouth daily.   Yes [provider]  pantoprazole (PROTONIX) 40 MG tablet Take 40 mg by mouth 2 (two) times daily.   Yes [provider]  polyethylene glycol powder (GLYCOLAX/MIRALAX) powder Take 17 g by mouth daily as needed for moderate constipation.    Yes Sable Feil, MD  polyvinyl alcohol (LIQUIFILM TEARS) 1.4 % ophthalmic solution Place 1 drop into both eyes as needed for dry eyes.   Yes [provider]  rosuvastatin (CRESTOR) 10 MG tablet Take 10 mg by mouth at bedtime.   Yes [provider]  bisacodyl (DULCOLAX) 10 MG suppository Place 1 suppository (10 mg total) rectally daily as needed for moderate constipation. Patient not taking: Reported on 07/06/2021 12/25/19   Edmisten, Drue Dun L, PA  cephALEXin (KEFLEX) 500 MG capsule Take 1 capsule (500 mg total) by mouth 3 (three) times daily. Patient not taking: Reported on  07/06/2021 06/24/21   Landis Martins, DPM  estradiol (ESTRACE) 0.1 MG/GM vaginal cream Place vaginally. 07/30/20   [provider]  furosemide (LASIX) 40 MG tablet Take 40 mg by mouth daily as needed for edema.    [provider]  linaclotide Rolan Lipa) 145 MCG CAPS capsule Take 1 capsule (145 mcg total) by mouth daily before breakfast. Patient not taking: Reported on 07/06/2021 06/25/20   Zehr, Laban Emperor, PA-C  mupirocin nasal ointment (BACTROBAN NASAL) 2 % Apply a small amount once daily for wound care at right 3rd toe Patient not taking: Reported on 07/06/2021 06/24/21   Landis Martins, DPM  nitroGLYCERIN (NITROSTAT) 0.4 MG SL tablet Place 0.4 mg under the tongue every 5 (five) minutes as needed for chest pain (TAKE UP TO 3 DOSES BEFORE CALLING 911). Patient not taking: Reported on 07/06/2021    [provider]  PFIZER COVID-19 VAC BIVALENT injection  04/01/21   [provider]  Sodium Sulfate-Mag Sulfate-KCl (SUTAB) [phone] MG TABS Take 1 kit by mouth as directed. [employer]!! BIN: [id number] PCN: CN GROUP: [id number] MEMBER ID: [id number];[employer] AS SECONDARY INSURANCE ;NO PRIOR AUTHORIZATION 06/25/20   Zehr, Laban Emperor, PA-C                                                                                                                                    Past Surgical History Past Surgical History:  Procedure Laterality Date   ACHILLES TENDON SURGERY Right 04/05/2018   Procedure: Right achilles tendon reconstruction;  Surgeon: Wylene Simmer, MD;  Location: [facility];  Service: Orthopedics;  Laterality: Right;  91mn   BACK SURGERY     cataracts     both eyes   CHOLECYSTECTOMY     CORONARY ANGIOPLASTY WITH STENT PLACEMENT  1990's   GASTROCNEMIUS RECESSION Right 04/05/2018   Procedure: Gastroc recession;  Surgeon: HWylene Simmer MD;  [place]  Service: Orthopedics;  Laterality: Right;   LAPAROSCOPIC HYSTERECTOMY      LUMBAR DISC SURGERY     X 3   RIGHT/LEFT HEART CATH AND CORONARY ANGIOGRAPHY N/A 04/10/2017   Procedure: RIGHT/LEFT HEART CATH AND CORONARY ANGIOGRAPHY;  Surgeon: SBelva Crome MD;  [facility];  Service: Cardiovascular;  Laterality: N/A;   TOTAL KNEE ARTHROPLASTY Right 01/14/2019   Procedure: TOTAL KNEE ARTHROPLASTY;  Surgeon: AGaynelle Arabian MD;  Location: [facility];  Service: Orthopedics;  Laterality: Right;  531m   TOTAL KNEE ARTHROPLASTY Left 12/23/2019   Procedure: TOTAL KNEE ARTHROPLASTY;  Surgeon: AlGaynelle ArabianMD;  Location: [facility];  Service: Orthopedics;  Laterality: Left;  5052m  urethral suspension  1993   Dr. NeaNori RiisVARICOSE VEIN SURGERY     x 2   Family History Family History  Problem Relation Age of Onset   Heart disease Father    Heart attack Father    Alzheimer's disease Mother    Colon cancer Paternal Aunt    Breast cancer Other        cousions   Esophageal cancer Neg Hx    Rectal cancer Neg Hx    Stomach cancer Neg Hx     Social History Social History   Tobacco Use   Smoking status: Former    Packs/day: 1.00    Years: 40.00    Pack years: 40.00    Types: Cigarettes    Quit date: 06/13/1998    Years since quitting: 23.0   Smokeless tobacco: Never  Vaping Use   Vaping Use: Never used  Substance Use Topics   Alcohol use: No    Alcohol/week: 0.0 standard drinks   Drug use: No   Allergies Codeine  Review of Systems Review of Systems  Gastrointestinal:  Positive for abdominal pain, nausea and vomiting.  Physical Exam Vital Signs  I have reviewed the triage vital signs BP (!) 130/115    Pulse 77    Temp 97.7 F (36.5 C) (Oral)    Resp (!) 21    Ht 5' 8"  (1.727 m)    Wt 90.8 kg    SpO2 90%    BMI 30.44 kg/m   Physical Exam Vitals and nursing note reviewed.  Constitutional:      General: She is not in acute distress.    Appearance: She is well-developed. She is ill-appearing.  HENT:     Head: Normocephalic and atraumatic.   Eyes:     Conjunctiva/sclera: Conjunctivae normal.  Cardiovascular:     Rate and Rhythm: Normal rate and regular rhythm.     Heart sounds: No murmur heard. Pulmonary:     Effort: Pulmonary effort is normal. No respiratory distress.     Breath sounds: Normal breath sounds.  Abdominal:     Palpations: Abdomen is soft.     Tenderness: There is abdominal tenderness in the right lower quadrant.  Musculoskeletal:        General: No swelling.     Cervical back: Neck supple.  Skin:    General: Skin is warm and dry.     Capillary Refill: Capillary refill takes less than 2 seconds.  Neurological:     Mental Status: She is alert.  Psychiatric:        Mood and Affect: Mood normal.    ED Results and Treatments Labs (all labs ordered are listed, but only abnormal results are displayed) Labs Reviewed  COMPREHENSIVE METABOLIC PANEL - Abnormal; Notable for the following components:      Result Value   Potassium 3.2 (*)    Glucose, Bld 135 (*)    All other components within normal limits  CBC - Abnormal; Notable for the following components:   RDW 17.9 (*)    Platelets 130 (*)    All other components within normal limits  URINALYSIS, ROUTINE W REFLEX MICROSCOPIC - Abnormal; Notable for the following components:   Color, Urine STRAW (*)    Hgb urine dipstick SMALL (*)    Ketones, ur 5 (*)    All other components within normal limits  LIPASE, BLOOD  I-STAT CHEM 8, ED  I-STAT BETA HCG BLOOD, ED ([facility], [facility], [facility] ONLY)                                                                                                                          Radiology CT ABDOMEN PELVIS W CONTRAST  Result Date: 07/06/2021 CLINICAL DATA:  Right lower quadrant abdominal pain. EXAM: CT ABDOMEN AND PELVIS WITH CONTRAST TECHNIQUE: Multidetector CT imaging of the abdomen and pelvis was performed using the standard protocol following bolus administration of intravenous contrast. RADIATION DOSE REDUCTION: This exam was  performed according to the departmental dose-optimization program which includes automated exposure control, adjustment of the mA and/or kV according to patient size and/or use of  iterative reconstruction technique. CONTRAST:  167m OMNIPAQUE IOHEXOL 300 MG/ML  SOLN COMPARISON:  CT abdomen pelvis dated 09/15/2017. FINDINGS: Lower chest: The visualized lung bases are clear. Advanced 3 vessel coronary vascular calcification. No intra-abdominal free air or free fluid. Hepatobiliary: The liver is unremarkable. There is mild intrahepatic biliary ductal dilatation, likely post cholecystectomy. Pancreas: Unremarkable. No pancreatic ductal dilatation or surrounding inflammatory changes. Spleen: Normal in size without focal abnormality. Adrenals/Urinary Tract: The adrenal glands unremarkable there is a punctate nonobstructing left renal upper pole calculus. There is mild parenchyma atrophy and diffuse cortical irregularity and scarring. Small bilateral renal hypodense lesions are not characterized. There is no hydronephrosis on either side. There is symmetric enhancement and excretion of contrast by both kidneys. The visualized ureters and urinary bladder appear unremarkable. Stomach/Bowel: Severe sigmoid diverticulosis without active inflammatory changes. Several diverticula in the small bowel in the mid abdomen measuring up to approximately 2 cm (45/2 and 50/5). There is mild haziness of the surrounding fat concerning for diverticulitis. No diverticular abscess or perforation. There is no bowel obstruction. The appendix is not visualized with certainty. No inflammatory changes identified in the right lower quadrant. Vascular/Lymphatic: Advanced aortoiliac atherosclerotic disease. The IVC is unremarkable. No portal venous gas. There is no adenopathy. Reproductive: Hysterectomy. No adnexal masses. A 2 cm right ovarian cyst. No follow-up imaging recommended. Note: This recommendation does not apply to premenarchal patients  and to those with increased risk (genetic, family history, elevated tumor markers or other high-risk factors) of ovarian cancer. Reference: JACR 2020 Feb; 17(2):248-254 Other: None Musculoskeletal: Degenerative changes of the spine. Lower lumbar fusion screws. No acute osseous pathology. IMPRESSION: 1. Findings concerning for diverticulitis of the small bowel in the mid abdomen. No diverticular abscess or perforation. 2. Aortic Atherosclerosis (ICD10-I70.0). Electronically Signed   By: AAnner CreteM.D.   On: 07/06/2021 23:30    Pertinent labs & imaging results that were available during my care of the patient were reviewed by me and considered in my medical decision making (see MDM for details).  Medications Ordered in ED Medications  morphine 4 MG/ML injection 4 mg (4 mg Intravenous Given 07/06/21 2155)  ondansetron (ZOFRAN) injection 4 mg (4 mg Intravenous Given 07/06/21 2154)  iohexol (OMNIPAQUE) 300 MG/ML solution 100 mL (100 mLs Intravenous Contrast Given 07/06/21 2311)                                                                                                                                     Procedures Procedures  (including critical care time)  Medical Decision Making / ED Course   This patient presents to the ED for concern of abdominal pain, this involves an extensive number of treatment options, and is a complaint that carries with it a high risk of complications and morbidity.  The differential diagnosis includes appendicitis, intra-abdominal infection, SBO, nephrolithiasis  MDM: Seen emergency department for evaluation of abdominal pain.  Physical exam  reveals an ill-appearing patient with tenderness in the right lower quadrant but is otherwise unremarkable.  Laboratory evaluation with a mild hypokalemia to 3.2, no significant leukocytosis, urinalysis unremarkable, lipase negative.  CT abdomen pelvis concerning for diverticulitis.  Patient initially given morphine for pain  control and on reevaluation, patient continually with pain.  Thus droperidol ordered.  Patient then signed out to oncoming provider.  Please see provider signout for continuation of work-up.  Anticipate discharge on Augmentin.   Additional history obtained: -Additional history obtained from fiance -External records from outside source obtained and reviewed including: Chart review including previous notes, labs, imaging, consultation notes   Lab Tests: -I ordered, reviewed, and interpreted labs.   The pertinent results include:   Labs Reviewed  COMPREHENSIVE METABOLIC PANEL - Abnormal; Notable for the following components:      Result Value   Potassium 3.2 (*)    Glucose, Bld 135 (*)    All other components within normal limits  CBC - Abnormal; Notable for the following components:   RDW 17.9 (*)    Platelets 130 (*)    All other components within normal limits  URINALYSIS, ROUTINE W REFLEX MICROSCOPIC - Abnormal; Notable for the following components:   Color, Urine STRAW (*)    Hgb urine dipstick SMALL (*)    Ketones, ur 5 (*)    All other components within normal limits  LIPASE, BLOOD  I-STAT CHEM 8, ED  I-STAT BETA HCG BLOOD, ED ([facility], [facility], [facility] ONLY)      EKG   EKG Interpretation  Date/Time:  Tuesday July 06 2021 21:40:41 EST Ventricular Rate:  75 PR Interval:  171 QRS Duration: 118 QT Interval:  435 QTC Calculation: 486 R Axis:   38 Text Interpretation: Sinus rhythm Incomplete left bundle branch block Confirmed by [place] ([id number]) on 07/07/2021 12:00:41 AM         Imaging Studies ordered: I ordered imaging studies including CT abdomen pelvis I independently visualized and interpreted imaging. I agree with the radiologist interpretation   Medicines ordered and prescription drug management: Meds ordered this encounter  Medications   morphine 4 MG/ML injection 4 mg   ondansetron (ZOFRAN) injection 4 mg   iohexol (OMNIPAQUE) 300 MG/ML solution 100 mL     -I have reviewed the patients home medicines and have made adjustments as needed  Critical interventions none    Cardiac Monitoring: The patient was maintained on a cardiac monitor.  I personally viewed and interpreted the cardiac monitored which showed an underlying rhythm of: Normal sinus rhythm  Social Determinants of Health:  Factors impacting patients care include: none   Reevaluation: After the interventions noted above, I reevaluated the patient and found that they have :stayed the same  Co morbidities that complicate the patient evaluation  Past Medical History:  Diagnosis Date   Achilles tendon rupture, right, initial encounter    Allergic rhinitis, cause unspecified    Anemia, unspecified    Anxiety state, unspecified    Atherosclerosis of aorta ([place])    TEE, June, 2010, grade 4 aortic arch atherosclerosis , Dr. Aundra Dubin   CAD (coronary artery disease)    DES and circumflex 2006 /  DES to LAD 2011   Carotid artery disease ([place])    Doppler, November, 2011, stable, 0-39% bilateral, turbulent flow left subclavian   Chronic diastolic heart failure ([place])    Cyst of thyroid    Diverticulosis of colon (without mention of hemorrhage)    Dysphasia  Possibly esophageal stricture   Ejection fraction     EF 60%, Echo, November 12, 2008, /  EF 55%, TEE, November 17, 2008   Esophageal reflux    Hiatal hernia    Hyperlipemia    Hypertension    Lumbago    Lumbar disc disease    MVP (mitral valve prolapse)    Neuropathy    Nonspecific abnormal finding in stool contents    Obesity, unspecified    Osteoarthrosis, unspecified whether generalized or localized, unspecified site    Osteopenia    Other diseases of lung, not elsewhere classified    Palpitations    October, 2012   Peripheral vascular disease, unspecified ([place])    Personal history of colonic polyps    ADENOMATOUS POLYP   PFO (patent foramen ovale)    Patient had a very small PFO by TEE 2010.  This was seen only by  bubble analysis during Valsalva   PONV (postoperative nausea and vomiting)    Shortness of breath    Sleep apnea    uses CPAP nightly   Sleep related hypoventilation/hypoxemia in conditions classifiable elsewhere    Stroke [facility]) 11/2008   Treated with TPA? 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 Victorino Dike.   Unspecified cerebral artery occlusion with cerebral infarction    Unspecified venous (peripheral) insufficiency    Vitamin B 12 deficiency       Dispostion: I considered admission for this patient, and her disposition will be pending pain control.  Please see provider signout note for continuation of work-up and dispo plan     Final Clinical Impression(s) / ED Diagnoses Final diagnoses:  None     @PCDICTATION @    Dawanda Mapel, Debe Coder, MD 07/07/21 0001

## 2021-07-07 ENCOUNTER — Encounter (HOSPITAL_COMMUNITY): Payer: Self-pay | Admitting: Internal Medicine

## 2021-07-07 DIAGNOSIS — I10 Essential (primary) hypertension: Secondary | ICD-10-CM | POA: Diagnosis not present

## 2021-07-07 DIAGNOSIS — J9621 Acute and chronic respiratory failure with hypoxia: Secondary | ICD-10-CM | POA: Diagnosis present

## 2021-07-07 DIAGNOSIS — J90 Pleural effusion, not elsewhere classified: Secondary | ICD-10-CM | POA: Diagnosis not present

## 2021-07-07 DIAGNOSIS — N179 Acute kidney failure, unspecified: Secondary | ICD-10-CM | POA: Diagnosis not present

## 2021-07-07 DIAGNOSIS — R0602 Shortness of breath: Secondary | ICD-10-CM | POA: Diagnosis not present

## 2021-07-07 DIAGNOSIS — I2699 Other pulmonary embolism without acute cor pulmonale: Secondary | ICD-10-CM | POA: Diagnosis not present

## 2021-07-07 DIAGNOSIS — E871 Hypo-osmolality and hyponatremia: Secondary | ICD-10-CM | POA: Diagnosis not present

## 2021-07-07 DIAGNOSIS — E538 Deficiency of other specified B group vitamins: Secondary | ICD-10-CM | POA: Diagnosis present

## 2021-07-07 DIAGNOSIS — K449 Diaphragmatic hernia without obstruction or gangrene: Secondary | ICD-10-CM | POA: Diagnosis not present

## 2021-07-07 DIAGNOSIS — N281 Cyst of kidney, acquired: Secondary | ICD-10-CM | POA: Diagnosis not present

## 2021-07-07 DIAGNOSIS — I739 Peripheral vascular disease, unspecified: Secondary | ICD-10-CM | POA: Diagnosis present

## 2021-07-07 DIAGNOSIS — K5732 Diverticulitis of large intestine without perforation or abscess without bleeding: Secondary | ICD-10-CM | POA: Diagnosis not present

## 2021-07-07 DIAGNOSIS — I5032 Chronic diastolic (congestive) heart failure: Secondary | ICD-10-CM

## 2021-07-07 DIAGNOSIS — E876 Hypokalemia: Secondary | ICD-10-CM | POA: Diagnosis present

## 2021-07-07 DIAGNOSIS — D696 Thrombocytopenia, unspecified: Secondary | ICD-10-CM

## 2021-07-07 DIAGNOSIS — G4733 Obstructive sleep apnea (adult) (pediatric): Secondary | ICD-10-CM | POA: Diagnosis present

## 2021-07-07 DIAGNOSIS — R0902 Hypoxemia: Secondary | ICD-10-CM | POA: Diagnosis not present

## 2021-07-07 DIAGNOSIS — K5792 Diverticulitis of intestine, part unspecified, without perforation or abscess without bleeding: Secondary | ICD-10-CM | POA: Diagnosis present

## 2021-07-07 DIAGNOSIS — I110 Hypertensive heart disease with heart failure: Secondary | ICD-10-CM | POA: Diagnosis present

## 2021-07-07 DIAGNOSIS — I2510 Atherosclerotic heart disease of native coronary artery without angina pectoris: Secondary | ICD-10-CM | POA: Diagnosis present

## 2021-07-07 DIAGNOSIS — Z79899 Other long term (current) drug therapy: Secondary | ICD-10-CM | POA: Diagnosis not present

## 2021-07-07 DIAGNOSIS — Z955 Presence of coronary angioplasty implant and graft: Secondary | ICD-10-CM | POA: Diagnosis not present

## 2021-07-07 DIAGNOSIS — Z20822 Contact with and (suspected) exposure to covid-19: Secondary | ICD-10-CM | POA: Diagnosis present

## 2021-07-07 DIAGNOSIS — Z87891 Personal history of nicotine dependence: Secondary | ICD-10-CM | POA: Diagnosis not present

## 2021-07-07 DIAGNOSIS — Z96653 Presence of artificial knee joint, bilateral: Secondary | ICD-10-CM | POA: Diagnosis present

## 2021-07-07 DIAGNOSIS — I2609 Other pulmonary embolism with acute cor pulmonale: Secondary | ICD-10-CM | POA: Diagnosis not present

## 2021-07-07 DIAGNOSIS — J9611 Chronic respiratory failure with hypoxia: Secondary | ICD-10-CM

## 2021-07-07 DIAGNOSIS — J449 Chronic obstructive pulmonary disease, unspecified: Secondary | ICD-10-CM | POA: Diagnosis present

## 2021-07-07 DIAGNOSIS — K5712 Diverticulitis of small intestine without perforation or abscess without bleeding: Secondary | ICD-10-CM | POA: Diagnosis present

## 2021-07-07 DIAGNOSIS — R400 Somnolence: Secondary | ICD-10-CM | POA: Diagnosis not present

## 2021-07-07 DIAGNOSIS — E8729 Other acidosis: Secondary | ICD-10-CM | POA: Diagnosis present

## 2021-07-07 DIAGNOSIS — E041 Nontoxic single thyroid nodule: Secondary | ICD-10-CM | POA: Diagnosis present

## 2021-07-07 DIAGNOSIS — R918 Other nonspecific abnormal finding of lung field: Secondary | ICD-10-CM | POA: Diagnosis not present

## 2021-07-07 DIAGNOSIS — Z79890 Hormone replacement therapy (postmenopausal): Secondary | ICD-10-CM | POA: Diagnosis not present

## 2021-07-07 DIAGNOSIS — R079 Chest pain, unspecified: Secondary | ICD-10-CM | POA: Diagnosis not present

## 2021-07-07 DIAGNOSIS — Z9981 Dependence on supplemental oxygen: Secondary | ICD-10-CM | POA: Diagnosis not present

## 2021-07-07 DIAGNOSIS — I82441 Acute embolism and thrombosis of right tibial vein: Secondary | ICD-10-CM | POA: Diagnosis not present

## 2021-07-07 LAB — CBC WITH DIFFERENTIAL/PLATELET
Abs Immature Granulocytes: 0.04 10*3/uL (ref 0.00–0.07)
Basophils Absolute: 0.1 10*3/uL (ref 0.0–0.1)
Basophils Relative: 1 %
Eosinophils Absolute: 0 10*3/uL (ref 0.0–0.5)
Eosinophils Relative: 1 %
HCT: 43.5 % (ref 36.0–46.0)
Hemoglobin: 13.7 g/dL (ref 12.0–15.0)
Immature Granulocytes: 1 %
Lymphocytes Relative: 17 %
Lymphs Abs: 1.2 10*3/uL (ref 0.7–4.0)
MCH: 28.4 pg (ref 26.0–34.0)
MCHC: 31.5 g/dL (ref 30.0–36.0)
MCV: 90.1 fL (ref 80.0–100.0)
Monocytes Absolute: 0.8 10*3/uL (ref 0.1–1.0)
Monocytes Relative: 11 %
Neutro Abs: 4.9 10*3/uL (ref 1.7–7.7)
Neutrophils Relative %: 69 %
Platelets: 125 10*3/uL — ABNORMAL LOW (ref 150–400)
RBC: 4.83 MIL/uL (ref 3.87–5.11)
RDW: 17.8 % — ABNORMAL HIGH (ref 11.5–15.5)
WBC: 7 10*3/uL (ref 4.0–10.5)
nRBC: 0 % (ref 0.0–0.2)

## 2021-07-07 LAB — HEPATIC FUNCTION PANEL
ALT: 12 U/L (ref 0–44)
AST: 17 U/L (ref 15–41)
Albumin: 3.5 g/dL (ref 3.5–5.0)
Alkaline Phosphatase: 65 U/L (ref 38–126)
Bilirubin, Direct: 0.1 mg/dL (ref 0.0–0.2)
Indirect Bilirubin: 0.4 mg/dL (ref 0.3–0.9)
Total Bilirubin: 0.5 mg/dL (ref 0.3–1.2)
Total Protein: 6.6 g/dL (ref 6.5–8.1)

## 2021-07-07 LAB — BASIC METABOLIC PANEL
Anion gap: 5 (ref 5–15)
BUN: 10 mg/dL (ref 8–23)
CO2: 28 mmol/L (ref 22–32)
Calcium: 8.6 mg/dL — ABNORMAL LOW (ref 8.9–10.3)
Chloride: 103 mmol/L (ref 98–111)
Creatinine, Ser: 0.63 mg/dL (ref 0.44–1.00)
GFR, Estimated: >60 mL/min (ref >60)
Glucose, Bld: 108 mg/dL — ABNORMAL HIGH (ref 70–99)
Potassium: 3.2 mmol/L — ABNORMAL LOW (ref 3.5–5.1)
Sodium: 136 mmol/L (ref 135–145)

## 2021-07-07 LAB — LACTIC ACID, PLASMA
Lactic Acid, Venous: 0.9 mmol/L (ref 0.5–1.9)
Lactic Acid, Venous: 0.9 mmol/L (ref 0.5–1.9)

## 2021-07-07 LAB — RESP PANEL BY RT-PCR (FLU A&B, COVID) ARPGX2
Influenza A by PCR: NEGATIVE
Influenza B by PCR: NEGATIVE
SARS Coronavirus 2 by RT PCR: NEGATIVE

## 2021-07-07 MED ORDER — PIPERACILLIN-TAZOBACTAM 3.375 G IVPB 30 MIN
3.3750 g | Freq: Once | INTRAVENOUS | Status: AC
  Administered 2021-07-07: 05:00:00 3.375 g via INTRAVENOUS
  Filled 2021-07-07: qty 50

## 2021-07-07 MED ORDER — MORPHINE SULFATE (PF) 2 MG/ML IV SOLN
2.0000 mg | INTRAVENOUS | Status: AC | PRN
  Administered 2021-07-07 – 2021-07-09 (×3): 2 mg via INTRAVENOUS
  Filled 2021-07-07 (×3): qty 1

## 2021-07-07 MED ORDER — PANTOPRAZOLE SODIUM 40 MG PO TBEC
40.0000 mg | DELAYED_RELEASE_TABLET | Freq: Two times a day (BID) | ORAL | Status: DC
  Administered 2021-07-07 – 2021-07-19 (×24): 40 mg via ORAL
  Filled 2021-07-07 (×24): qty 1

## 2021-07-07 MED ORDER — MORPHINE SULFATE (PF) 4 MG/ML IV SOLN
4.0000 mg | Freq: Once | INTRAVENOUS | Status: AC
Start: 2021-07-07 — End: 2021-07-07
  Administered 2021-07-07: 02:00:00 4 mg via INTRAVENOUS
  Filled 2021-07-07: qty 1

## 2021-07-07 MED ORDER — FENTANYL CITRATE PF 50 MCG/ML IJ SOSY
25.0000 ug | PREFILLED_SYRINGE | INTRAMUSCULAR | Status: DC | PRN
  Administered 2021-07-07 (×4): 25 ug via INTRAVENOUS
  Filled 2021-07-07 (×4): qty 1

## 2021-07-07 MED ORDER — GABAPENTIN 300 MG PO CAPS
600.0000 mg | ORAL_CAPSULE | Freq: Three times a day (TID) | ORAL | Status: DC
  Administered 2021-07-07 – 2021-07-19 (×35): 600 mg via ORAL
  Filled 2021-07-07 (×36): qty 2

## 2021-07-07 MED ORDER — ISOSORBIDE MONONITRATE ER 30 MG PO TB24
30.0000 mg | ORAL_TABLET | Freq: Every day | ORAL | Status: DC
  Administered 2021-07-07 – 2021-07-13 (×7): 30 mg via ORAL
  Filled 2021-07-07 (×7): qty 1

## 2021-07-07 MED ORDER — PIPERACILLIN-TAZOBACTAM 3.375 G IVPB
3.3750 g | Freq: Three times a day (TID) | INTRAVENOUS | Status: DC
  Administered 2021-07-07 – 2021-07-12 (×15): 3.375 g via INTRAVENOUS
  Filled 2021-07-07 (×17): qty 50

## 2021-07-07 MED ORDER — METOPROLOL TARTRATE 50 MG PO TABS
50.0000 mg | ORAL_TABLET | Freq: Two times a day (BID) | ORAL | Status: DC
  Administered 2021-07-07 – 2021-07-13 (×11): 50 mg via ORAL
  Filled 2021-07-07 (×13): qty 1

## 2021-07-07 MED ORDER — BUPROPION HCL ER (XL) 300 MG PO TB24
300.0000 mg | ORAL_TABLET | Freq: Every day | ORAL | Status: DC
  Administered 2021-07-08 – 2021-07-19 (×12): 300 mg via ORAL
  Filled 2021-07-07 (×12): qty 1

## 2021-07-07 MED ORDER — LACTATED RINGERS IV SOLN: INTRAVENOUS | Status: AC

## 2021-07-07 MED ORDER — ONDANSETRON 4 MG PO TBDP
4.0000 mg | ORAL_TABLET | Freq: Once | ORAL | Status: AC
  Administered 2021-07-07: 22:00:00 4 mg via ORAL
  Filled 2021-07-07: qty 1

## 2021-07-07 MED ORDER — POTASSIUM CHLORIDE CRYS ER 20 MEQ PO TBCR
40.0000 meq | EXTENDED_RELEASE_TABLET | ORAL | Status: AC
  Administered 2021-07-07 – 2021-07-08 (×2): 40 meq via ORAL
  Filled 2021-07-07 (×2): qty 2

## 2021-07-07 MED ORDER — ROSUVASTATIN CALCIUM 10 MG PO TABS
10.0000 mg | ORAL_TABLET | Freq: Every day | ORAL | Status: DC
  Administered 2021-07-07 – 2021-07-18 (×12): 10 mg via ORAL
  Filled 2021-07-07 (×12): qty 1

## 2021-07-07 MED ORDER — AMLODIPINE BESYLATE 5 MG PO TABS
5.0000 mg | ORAL_TABLET | Freq: Every morning | ORAL | Status: DC
  Administered 2021-07-08 – 2021-07-13 (×6): 5 mg via ORAL
  Filled 2021-07-07 (×6): qty 1

## 2021-07-07 MED ORDER — ACETAMINOPHEN 325 MG PO TABS
650.0000 mg | ORAL_TABLET | Freq: Four times a day (QID) | ORAL | Status: AC | PRN
  Administered 2021-07-09 – 2021-07-10 (×2): 650 mg via ORAL
  Filled 2021-07-07 (×2): qty 2

## 2021-07-07 MED ORDER — LABETALOL HCL 5 MG/ML IV SOLN: 10.0000 mg | INTRAVENOUS | Status: DC | PRN

## 2021-07-07 NOTE — Hospital Course (Addendum)
Stephanie Alvarado is [name] 81 y.o. female with history of CAD status post stenting, chronic diastolic CHF, hypertension, sleep apnea uses oxygen at home presents to the ER after patient started having severe abdominal pain involving the mid abdomen and right lower quadrant since 2 PM yesterday.  Pain is constant had some episodes of vomiting no blood in the vomitus denies any diarrhea fever or chills.  She's been found to have small bowel diverticulitis.  She'd improved on IV antibiotics.  Plan was for potential discharge on 1/30, but she was hypoxic with ambulation and CT PE showed PE with right heart strain.  She's been started on heparin.  Pulmonary was consulted due to concern for submassive PE.  Currently receiving anticoagulation, expect she'll need 2-3 days anticoagulation with heparin, then can consider transition to doac and discharge.    See below for additional details

## 2021-07-07 NOTE — Assessment & Plan Note (Addendum)
Chronic, follow  Relatively stable in 90's over past few days, follow Watch closely with need for anticoagulation given PE

## 2021-07-07 NOTE — Consult Note (Signed)
[facility] Surgery Consult Note  Stephanie Alvarado 28-Apr-1941  [id number].    Requesting MD: Gean Birchwood Chief Complaint/Reason for Consult: small bowel diverticulitis  HPI:  Stephanie Alvarado is an 81yo female PMH CAD status post stenting, chronic diastolic CHF, HTN, HLD, sleep apnea uses oxygen 2-3L O2 PRN at home who presented to [facility] last night complaining of acute onset abdominal pain. States that the pain started about 1430 yesterday. It is mostly in the right lower quadrant and radiates into her back. Associated with nausea and vomiting. Denies diarrhea, fever, or chills. States that she has a history of diverticulosis, but has never had any pain like this before. In the ED patient was found to be hypertensive, otherwise afebrile and vital signs stable. Lab work fairly unremarkable with a normal WBC and a normal lactic acid. CT scan showed diverticulitis of the small bowel in the mid abdomen with no diverticular abscess or perforation. Patient was started on IV zosyn and admitted to the medical service. General surgery asked to see.  Abdominal surgical history: open cholecystectomy/ appendectomy, laparoscopic hysterectomy Last colonoscopy: 04/2012 Anticoagulants: none Former smoker, quit ~20 years ago Denies alcohol or illicit drug use Lives at home alone Ambulates with a walker  Review of Systems  Constitutional: Negative.   Respiratory: Negative.    Cardiovascular: Negative.   Gastrointestinal:  Positive for abdominal pain, nausea and vomiting.   All systems reviewed and otherwise negative except for as above  Family History  Problem Relation Age of Onset   Heart disease Father    Heart attack Father    Alzheimer's disease Mother    Colon cancer Paternal Aunt    Breast cancer Other        cousions   Esophageal cancer Neg Hx    Rectal cancer Neg Hx    Stomach cancer Neg Hx     Past Medical History:  Diagnosis Date   Achilles tendon rupture, right, initial  encounter    Allergic rhinitis, cause unspecified    Anemia, unspecified    Anxiety state, unspecified    Atherosclerosis of aorta ([place])    TEE, June, 2010, grade 4 aortic arch atherosclerosis , Dr. Aundra Dubin   CAD (coronary artery disease)    DES and circumflex 2006 /  DES to LAD 2011   Carotid artery disease ([place])    Doppler, November, 2011, stable, 0-39% bilateral, turbulent flow left subclavian   Chronic diastolic heart failure ([place])    Cyst of thyroid    Diverticulosis of colon (without mention of hemorrhage)    Dysphasia    Possibly esophageal stricture   Ejection fraction     EF 60%, Echo, November 12, 2008, /  EF 55%, TEE, November 17, 2008   Esophageal reflux    Hiatal hernia    Hyperlipemia    Hypertension    Lumbago    Lumbar disc disease    MVP (mitral valve prolapse)    Neuropathy    Nonspecific abnormal finding in stool contents    Obesity, unspecified    Osteoarthrosis, unspecified whether generalized or localized, unspecified site    Osteopenia    Other diseases of lung, not elsewhere classified    Palpitations    October, 2012   Peripheral vascular disease, unspecified ([place])    Personal history of colonic polyps    ADENOMATOUS POLYP   PFO (patent foramen ovale)    Patient had a very small PFO by TEE 2010.  This was seen only by bubble  analysis during Valsalva   PONV (postoperative nausea and vomiting)    Shortness of breath    Sleep apnea    uses CPAP nightly   Sleep related hypoventilation/hypoxemia in conditions classifiable elsewhere    Stroke [facility]) 11/2008   Treated with TPA? 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 Victorino Dike.   Unspecified cerebral artery occlusion with cerebral infarction    Unspecified venous (peripheral) insufficiency    Vitamin B 12 deficiency     Past Surgical History:  Procedure Laterality Date   ACHILLES TENDON SURGERY Right  04/05/2018   Procedure: Right achilles tendon reconstruction;  Surgeon: Wylene Simmer, MD;  Location: [place];  Service: Orthopedics;  Laterality: Right;  55min   BACK SURGERY     cataracts     both eyes   CHOLECYSTECTOMY     CORONARY ANGIOPLASTY WITH STENT PLACEMENT  1990's   GASTROCNEMIUS RECESSION Right 04/05/2018   Procedure: Gastroc recession;  Surgeon: Wylene Simmer, MD;  Location: [place];  Service: Orthopedics;  Laterality: Right;   LAPAROSCOPIC HYSTERECTOMY     LUMBAR DISC SURGERY     X 3   RIGHT/LEFT HEART CATH AND CORONARY ANGIOGRAPHY N/A 04/10/2017   Procedure: RIGHT/LEFT HEART CATH AND CORONARY ANGIOGRAPHY;  Surgeon: Belva Crome, MD;  Location: [place] [facility];  Service: Cardiovascular;  Laterality: N/A;   TOTAL KNEE ARTHROPLASTY Right 01/14/2019   Procedure: TOTAL KNEE ARTHROPLASTY;  Surgeon: Gaynelle Arabian, MD;  [facility];  Service: Orthopedics;  Laterality: Right;  58min   TOTAL KNEE ARTHROPLASTY Left 12/23/2019   Procedure: TOTAL KNEE ARTHROPLASTY;  Surgeon: Gaynelle Arabian, MD;  Location: [facility];  Service: Orthopedics;  Laterality: Left;  79min   urethral suspension  1993   Dr. Nori Riis   VARICOSE VEIN SURGERY     x 2    Social History:  reports that she quit smoking about 23 years ago. Her smoking use included cigarettes. She has a 40.00 pack-year smoking history. She has never used smokeless tobacco. She reports that she does not drink alcohol and does not use drugs.  Allergies:  Allergies  Allergen Reactions   Codeine Itching    REACTION: itch    (Not in a hospital admission)   Prior to Admission medications   Medication Sig Start Date End Date Taking? Authorizing Provider  amLODipine (NORVASC) 5 MG tablet Take 5 mg by mouth every morning. 05/17/21  Yes [provider]  buPROPion (WELLBUTRIN XL) 150 MG 24 hr tablet Take 300 mg by mouth daily. 10/31/15  Yes [provider]  docusate sodium (COLACE) 100  MG capsule Take 100 mg by mouth daily as needed for mild constipation.   Yes [provider]  furosemide (LASIX) 40 MG tablet Take 40 mg by mouth daily as needed for edema.   Yes [provider]  gabapentin (NEURONTIN) 300 MG capsule Take 600 mg by mouth 3 (three) times daily.  10/31/15  Yes [provider]  isosorbide mononitrate (IMDUR) 30 MG 24 hr tablet Take 30 mg by mouth at bedtime.    Yes [provider]  metoprolol tartrate (LOPRESSOR) 50 MG tablet Take 50 mg by mouth 2 (two) times daily.    Yes [provider]  Multiple Vitamin (MULTIVITAMIN) capsule Take 1 capsule by mouth daily.   Yes [provider]  mupirocin ointment (BACTROBAN) 2 % Apply 1 application topically daily. 06/24/21  Yes [provider]  pantoprazole (PROTONIX) 40 MG tablet Take 40 mg by mouth 2 (two) times daily.   Yes [provider]  polyethylene glycol powder (GLYCOLAX/MIRALAX) powder Take 17 g by mouth daily as needed for moderate constipation.    Yes Sable Feil, MD  polyvinyl alcohol (LIQUIFILM TEARS) 1.4 % ophthalmic solution Place 1 drop into both eyes as needed for dry eyes.   Yes [provider]  rosuvastatin (CRESTOR) 10 MG tablet Take 10 mg by mouth at bedtime.   Yes [provider]  bisacodyl (DULCOLAX) 10 MG suppository Place 1 suppository (10 mg total) rectally daily as needed for moderate constipation. Patient not taking: Reported on 07/06/2021 12/25/19   Edmisten, Drue Dun L, PA  cephALEXin (KEFLEX) 500 MG capsule Take 1 capsule (500 mg total) by mouth 3 (three) times daily. Patient not taking: Reported on 07/06/2021 06/24/21   Landis Martins, DPM  linaclotide Rolan Lipa) 145 MCG CAPS capsule Take 1 capsule (145 mcg total) by mouth daily before breakfast. Patient not taking: Reported on 07/06/2021 06/25/20   Zehr, Laban Emperor, PA-C  nitroGLYCERIN (NITROSTAT) 0.4 MG SL tablet Place 0.4 mg under the tongue every 5 (five)  minutes as needed for chest pain (TAKE UP TO 3 DOSES BEFORE CALLING 911). Patient not taking: Reported on 07/06/2021    [provider]  PFIZER COVID-19 VAC BIVALENT injection  04/01/21   [provider]    Blood pressure (!) 154/88, pulse 60, temperature 97.7 F (36.5 C), temperature source Oral, resp. rate 19, height 5\' 8"  (1.727 m), weight 90.8 kg, SpO2 97 %. Physical Exam: General: pleasant, WD/WN female who is laying in bed in NAD HEENT: head is normocephalic, atraumatic.  Sclera are noninjected.  Pupils equal and round.  Ears and nose without any masses or lesions.  Mouth is pink and moist. Dentition fair Heart: regular, rate, and rhythm.  Normal s1,s2. No obvious murmurs, gallops, or rubs noted.  Palpable pedal pulses bilaterally  Lungs: CTAB, no wheezes, rhonchi, or rales noted.  Respiratory effort nonlabored Abd: soft, ND, +BS, no masses, hernias, or organomegaly. Mild RLQ TTP without rebound or guarding MS: no BUE/BLE edema, calves soft and nontender Skin: warm and dry with no masses, lesions, or rashes Psych: A&Ox4 with an appropriate affect Neuro: cranial nerves grossly intact, equal strength in BUE/BLE bilaterally, normal speech, thought process intact  Results for orders placed or performed during the hospital encounter of 07/06/21 (from the past 48 hour(s))  Lipase, blood     Status: None   Collection Time: 07/06/21  9:39 PM  Result Value Ref Range   Lipase 25 11 - 51 U/L    Comment: Performed at [facility], [place] [address] [place], [place] [address]  Comprehensive metabolic panel     Status: Abnormal   Collection Time: 07/06/21  9:39 PM  Result Value Ref Range   Sodium 137 135 - 145 mmol/L   Potassium 3.2 (L) 3.5 - 5.1 mmol/L   Chloride 103 98 - 111 mmol/L   CO2 28 22 - 32 mmol/L   Glucose, Bld 135 (H) 70 - 99 mg/dL    Comment: Glucose reference range applies only to samples taken after fasting for at least 8 hours.   BUN 14 8 -  23 mg/dL   Creatinine, Ser 0.71 0.44 - 1.00 mg/dL   Calcium 9.4 8.9 - 10.3 mg/dL   Total Protein 7.8  6.5 - 8.1 g/dL   Albumin 4.1 3.5 - 5.0 g/dL   AST 17 15 - 41 U/L   ALT 14 0 - 44 U/L   Alkaline Phosphatase 75 38 - 126 U/L   Total Bilirubin 0.6 0.3 - 1.2 mg/dL   GFR, Estimated >60 >60 mL/min    Comment: (NOTE) Calculated using the CKD-EPI Creatinine Equation (2021)    Anion gap 6 5 - 15    Comment: Performed at [facility], [place] [address] [place], [place] [address]  CBC     Status: Abnormal   Collection Time: 07/06/21  9:39 PM  Result Value Ref Range   WBC 7.6 4.0 - 10.5 K/uL   RBC 4.68 3.87 - 5.11 MIL/uL   Hemoglobin 13.0 12.0 - 15.0 g/dL   HCT 42.3 36.0 - 46.0 %   MCV 90.4 80.0 - 100.0 fL   MCH 27.8 26.0 - 34.0 pg   MCHC 30.7 30.0 - 36.0 g/dL   RDW 17.9 (H) 11.5 - 15.5 %   Platelets 130 (L) 150 - 400 K/uL    Comment: CONSISTENT WITH PREVIOUS RESULT REPEATED TO VERIFY    nRBC 0.0 0.0 - 0.2 %    Comment: Performed at [facility], [place] [address] [place], [place] [address]  Urinalysis, Routine w reflex microscopic Urine, Clean Catch     Status: Abnormal   Collection Time: 07/06/21  9:55 PM  Result Value Ref Range   Color, Urine STRAW (A) YELLOW   APPearance CLEAR CLEAR   Specific Gravity, Urine 1.008 1.005 - 1.030   pH 8.0 5.0 - 8.0   Glucose, UA NEGATIVE NEGATIVE mg/dL   Hgb urine dipstick SMALL (A) NEGATIVE   Bilirubin Urine NEGATIVE NEGATIVE   Ketones, ur 5 (A) NEGATIVE mg/dL   Protein, ur NEGATIVE NEGATIVE mg/dL   Nitrite NEGATIVE NEGATIVE   Leukocytes,Ua NEGATIVE NEGATIVE   RBC / HPF 0-5 0 - 5 RBC/hpf   Bacteria, UA NONE SEEN NONE SEEN   Squamous Epithelial / LPF 0-5 0 - 5   Mucus PRESENT     Comment: Performed at [facility], [place] [address] [place],  [address]  I-Stat beta hCG blood, ED ([facility], [facility], [facility] only)     Status: None   Collection Time: 07/06/21 10:01 PM  Result Value Ref Range    I-stat hCG, quantitative <5.0 <5 mIU/mL   Comment 3            Comment:   GEST. AGE      CONC.  (mIU/mL)   <=1 WEEK        5 - 50     2 WEEKS       50 - 500     3 WEEKS       100 - 10,000     4 WEEKS     1,000 - 30,000        FEMALE AND NON-PREGNANT FEMALE:     LESS THAN 5 mIU/mL   Resp Panel by RT-PCR (Flu A&B, Covid) Nasopharyngeal Swab     Status: None   Collection Time: 07/07/21  2:10 AM   Specimen: Nasopharyngeal Swab; Nasopharyngeal(NP) swabs in vial transport medium  Result Value Ref Range   SARS Coronavirus 2 by RT PCR NEGATIVE NEGATIVE    Comment: (NOTE) SARS-CoV-2 target nucleic acids are NOT DETECTED.  The SARS-CoV-2 RNA is generally detectable in upper respiratory specimens during the acute phase of infection. The lowest concentration of  SARS-CoV-2 viral copies this assay can detect is 138 copies/mL. A negative result does not preclude SARS-Cov-2 infection and should not be used as the sole basis for treatment or other patient management decisions. A negative result may occur with  improper specimen collection/handling, submission of specimen other than nasopharyngeal swab, presence of viral mutation(s) within the areas targeted by this assay, and inadequate number of viral copies(<138 copies/mL). A negative result must be combined with clinical observations, patient history, and epidemiological information. The expected result is Negative.  Fact Sheet for Patients:  EntrepreneurPulse.com.au  Fact Sheet for Healthcare Providers:  IncredibleEmployment.be  This test is no t yet approved or cleared by the [place] FDA and  has been authorized for detection and/or diagnosis of SARS-CoV-2 by FDA under an Emergency Use Authorization (EUA). This EUA will remain  in effect (meaning this test can be used) for the duration of the COVID-19 declaration under Section 564(b)(1) of the Act, 21 U.S.C.section 360bbb-3(b)(1), unless the  authorization is terminated  or revoked sooner.       Influenza A by PCR NEGATIVE NEGATIVE   Influenza B by PCR NEGATIVE NEGATIVE    Comment: (NOTE) The Xpert Xpress SARS-CoV-2/FLU/RSV plus assay is intended as an aid in the diagnosis of influenza from Nasopharyngeal swab specimens and should not be used as a sole basis for treatment. Nasal washings and aspirates are unacceptable for Xpert Xpress SARS-CoV-2/FLU/RSV testing.  Fact Sheet for Patients: EntrepreneurPulse.com.au  Fact Sheet for Healthcare Providers: IncredibleEmployment.be  This test is not yet approved or cleared by the [place] FDA and has been authorized for detection and/or diagnosis of SARS-CoV-2 by FDA under an Emergency Use Authorization (EUA). This EUA will remain in effect (meaning this test can be used) for the duration of the COVID-19 declaration under Section 564(b)(1) of the Act, 21 U.S.C. section 360bbb-3(b)(1), unless the authorization is terminated or revoked.  Performed at [facility], [place] [address] [place], [place] [address]   Basic metabolic panel     Status: Abnormal   Collection Time: 07/07/21  4:30 AM  Result Value Ref Range   Sodium 136 135 - 145 mmol/L   Potassium 3.2 (L) 3.5 - 5.1 mmol/L   Chloride 103 98 - 111 mmol/L   CO2 28 22 - 32 mmol/L   Glucose, Bld 108 (H) 70 - 99 mg/dL    Comment: Glucose reference range applies only to samples taken after fasting for at least 8 hours.   BUN 10 8 - 23 mg/dL   Creatinine, Ser 0.63 0.44 - 1.00 mg/dL   Calcium 8.6 (L) 8.9 - 10.3 mg/dL   GFR, Estimated >60 >60 mL/min    Comment: (NOTE) Calculated using the CKD-EPI Creatinine Equation (2021)    Anion gap 5 5 - 15    Comment: Performed at [facility], [place] [address] [place], [place] [address]  Hepatic function panel     Status: None   Collection Time: 07/07/21  4:30 AM  Result Value Ref Range   Total Protein  6.6 6.5 - 8.1 g/dL   Albumin 3.5 3.5 - 5.0 g/dL   AST 17 15 - 41 U/L   ALT 12 0 - 44 U/L   Alkaline Phosphatase 65 38 - 126 U/L   Total Bilirubin 0.5 0.3 - 1.2 mg/dL   Bilirubin, Direct 0.1 0.0 - 0.2 mg/dL   Indirect Bilirubin 0.4 0.3 - 0.9 mg/dL    Comment: Performed at [facility], [place]  [address], [place], [place] [address]  CBC WITH DIFFERENTIAL     Status: Abnormal   Collection Time: 07/07/21  4:30 AM  Result Value Ref Range   WBC 7.0 4.0 - 10.5 K/uL   RBC 4.83 3.87 - 5.11 MIL/uL   Hemoglobin 13.7 12.0 - 15.0 g/dL   HCT 43.5 36.0 - 46.0 %   MCV 90.1 80.0 - 100.0 fL   MCH 28.4 26.0 - 34.0 pg   MCHC 31.5 30.0 - 36.0 g/dL   RDW 17.8 (H) 11.5 - 15.5 %   Platelets 125 (L) 150 - 400 K/uL    Comment: Immature Platelet Fraction may be clinically indicated, consider ordering this additional test [id number] CONSISTENT WITH PREVIOUS RESULT REPEATED TO VERIFY    nRBC 0.0 0.0 - 0.2 %   Neutrophils Relative % 69 %   Neutro Abs 4.9 1.7 - 7.7 K/uL   Lymphocytes Relative 17 %   Lymphs Abs 1.2 0.7 - 4.0 K/uL   Monocytes Relative 11 %   Monocytes Absolute 0.8 0.1 - 1.0 K/uL   Eosinophils Relative 1 %   Eosinophils Absolute 0.0 0.0 - 0.5 K/uL   Basophils Relative 1 %   Basophils Absolute 0.1 0.0 - 0.1 K/uL   Immature Granulocytes 1 %   Abs Immature Granulocytes 0.04 0.00 - 0.07 K/uL    Comment: Performed at [facility], [place] [address], [place], [place] [address]  Lactic acid, plasma     Status: None   Collection Time: 07/07/21  4:30 AM  Result Value Ref Range   Lactic Acid, Venous 0.9 0.5 - 1.9 mmol/L    Comment: Performed at [facility], [place] [address], [place], [place] [address]   CT ABDOMEN PELVIS W CONTRAST  Result Date: 07/06/2021 CLINICAL DATA:  Right lower quadrant abdominal pain. EXAM: CT ABDOMEN AND PELVIS WITH CONTRAST TECHNIQUE: Multidetector CT imaging of the abdomen and pelvis was performed using the standard  protocol following bolus administration of intravenous contrast. RADIATION DOSE REDUCTION: This exam was performed according to the departmental dose-optimization program which includes automated exposure control, adjustment of the mA and/or kV according to patient size and/or use of iterative reconstruction technique. CONTRAST:  166mL OMNIPAQUE IOHEXOL 300 MG/ML  SOLN COMPARISON:  CT abdomen pelvis dated 09/15/2017. FINDINGS: Lower chest: The visualized lung bases are clear. Advanced 3 vessel coronary vascular calcification. No intra-abdominal free air or free fluid. Hepatobiliary: The liver is unremarkable. There is mild intrahepatic biliary ductal dilatation, likely post cholecystectomy. Pancreas: Unremarkable. No pancreatic ductal dilatation or surrounding inflammatory changes. Spleen: Normal in size without focal abnormality. Adrenals/Urinary Tract: The adrenal glands unremarkable there is a punctate nonobstructing left renal upper pole calculus. There is mild parenchyma atrophy and diffuse cortical irregularity and scarring. Small bilateral renal hypodense lesions are not characterized. There is no hydronephrosis on either side. There is symmetric enhancement and excretion of contrast by both kidneys. The visualized ureters and urinary bladder appear unremarkable. Stomach/Bowel: Severe sigmoid diverticulosis without active inflammatory changes. Several diverticula in the small bowel in the mid abdomen measuring up to approximately 2 cm (45/2 and 50/5). There is mild haziness of the surrounding fat concerning for diverticulitis. No diverticular abscess or perforation. There is no bowel obstruction. The appendix is not visualized with certainty. No inflammatory changes identified in the right lower quadrant. Vascular/Lymphatic: Advanced aortoiliac atherosclerotic disease. The IVC is unremarkable. No portal venous gas. There is no adenopathy. Reproductive: Hysterectomy. No adnexal masses. A 2 cm right ovarian  cyst.  No follow-up imaging recommended. Note: This recommendation does not apply to premenarchal patients and to those with increased risk (genetic, family history, elevated tumor markers or other high-risk factors) of ovarian cancer. Reference: JACR 2020 Feb; 17(2):248-254 Other: None Musculoskeletal: Degenerative changes of the spine. Lower lumbar fusion screws. No acute osseous pathology. IMPRESSION: 1. Findings concerning for diverticulitis of the small bowel in the mid abdomen. No diverticular abscess or perforation. 2. Aortic Atherosclerosis (ICD10-I70.0). Electronically Signed   By: Anner Crete M.D.   On: 07/06/2021 23:30    Anti-infectives (From admission, onward)    Start     Dose/Rate Route Frequency Ordered Stop   07/07/21 1200  piperacillin-tazobactam (ZOSYN) IVPB 3.375 g        3.375 g 12.5 mL/hr over 240 Minutes Intravenous Every 8 hours 07/07/21 0439     07/07/21 0415  piperacillin-tazobactam (ZOSYN) IVPB 3.375 g        3.375 g 100 mL/hr over 30 Minutes Intravenous  Once 07/07/21 0413 07/07/21 0505   07/07/21 0000  amoxicillin-clavulanate (AUGMENTIN) 875-125 MG per tablet 1 tablet        1 tablet Oral  Once 07/06/21 2353 07/07/21 0003        Assessment/Plan Small bowel diverticulitis - CT scan 1/24 showed diverticulitis of the small bowel in the mid abdomen with no diverticular abscess or perforation - WBC and lactic acid WNL, afebrile, VSS - No role for acute surgical intervention. Agree with admission for IV antibiotics. She is not very tender on exam and she does not have a leukocytosis so I think she's ok for clear liquids. We will follow.   ID - zosyn 1/25>> VTE - SCDs, ok for chemical dvt ppx from surgical standpoint FEN - IVF, CLD Foley - none  CAD status post stenting Chronic diastolic CHF HTN HLD Sleep apnea, uses home oxygen 2-3L PRN  Moderate Medical Decision Making  Wellington Hampshire, PA-C [facility] 07/07/2021, 10:15 AM Please see  Amion for pager number during day hours 7:00am-4:30pm

## 2021-07-07 NOTE — Assessment & Plan Note (Addendum)
CT with small bowel diverticulitis Continue abx -> narrow to unasyn (plan for 10 days abx) Appreciate surgery assistance Advance to soft diet per surgery Mobilize This has improved

## 2021-07-07 NOTE — Assessment & Plan Note (Addendum)
On oxygen at night for sleep apnea Says she uses as needed generally

## 2021-07-07 NOTE — Progress Notes (Signed)
Pharmacy Antibiotic Note  Stephanie Alvarado is a 81 y.o. female admitted on 07/06/2021 with abdominal pain.  Pharmacy has been consulted for zosyn dosing.  Plan: Zosyn 3.375g IV q8h (4 hour infusion). Pharmacy will sign off and follow peripherally  Height: 5\' 8"  (172.7 cm) Weight: 90.8 kg (200 lb 2.8 oz) IBW/kg (Calculated) : 63.9  Temp (24hrs), Avg:97.7 F (36.5 C), Min:97.7 F (36.5 C), Max:97.7 F (36.5 C)  Recent Labs  Lab 07/06/21 2139  WBC 7.6  CREATININE 0.71    Estimated Creatinine Clearance: 63.9 mL/min (by C-G formula based on SCr of 0.71 mg/dL).    Allergies  Allergen Reactions   Codeine Itching    REACTION: itch     Thank you for allowing pharmacy to be a part of this patients care.  Dolly Rias RPh 07/07/2021, 4:38 AM

## 2021-07-07 NOTE — Assessment & Plan Note (Signed)
Amlodipine, imdur, metoprolol

## 2021-07-07 NOTE — Assessment & Plan Note (Addendum)
No sx ACS

## 2021-07-07 NOTE — H&P (Signed)
History and Physical    Stephanie Alvarado XLK:[id number] DOB: 12/03/1940 DOA: 07/06/2021  PCP: Caryl Bis, MD  Patient coming from: Home.  Chief Complaint: Abdominal pain.  HPI: Stephanie Alvarado is a 81 y.o. female with history of CAD status post stenting, chronic diastolic CHF, hypertension, sleep apnea uses oxygen at home presents to the ER after patient started having severe abdominal pain involving the mid abdomen and right lower quadrant since 2 PM yesterday.  Pain is constant had some episodes of vomiting no blood in the vomitus denies any diarrhea fever or chills.  ED Course: In the ER patient had CT abdomen pelvis which shows diverticulitis involving the small bowel.  Patient was started on empiric antibiotics admitted for further management.  COVID test is pending.  Review of Systems: As per HPI, rest all negative.   Past Medical History:  Diagnosis Date   Achilles tendon rupture, right, initial encounter    Allergic rhinitis, cause unspecified    Anemia, unspecified    Anxiety state, unspecified    Atherosclerosis of aorta ([place])    TEE, June, 2010, grade 4 aortic arch atherosclerosis , Dr. Aundra Dubin   CAD (coronary artery disease)    DES and circumflex 2006 /  DES to LAD 2011   Carotid artery disease ([place])    Doppler, November, 2011, stable, 0-39% bilateral, turbulent flow left subclavian   Chronic diastolic heart failure ([place])    Cyst of thyroid    Diverticulosis of colon (without mention of hemorrhage)    Dysphasia    Possibly esophageal stricture   Ejection fraction     EF 60%, Echo, November 12, 2008, /  EF 55%, TEE, November 17, 2008   Esophageal reflux    Hiatal hernia    Hyperlipemia    Hypertension    Lumbago    Lumbar disc disease    MVP (mitral valve prolapse)    Neuropathy    Nonspecific abnormal finding in stool contents    Obesity, unspecified    Osteoarthrosis, unspecified whether generalized or localized, unspecified site    Osteopenia    Other diseases of  lung, not elsewhere classified    Palpitations    October, 2012   Peripheral vascular disease, unspecified ([place])    Personal history of colonic polyps    ADENOMATOUS POLYP   PFO (patent foramen ovale)    Patient had a very small PFO by TEE 2010.  This was seen only by bubble analysis during Valsalva   PONV (postoperative nausea and vomiting)    Shortness of breath    Sleep apnea    uses CPAP nightly   Sleep related hypoventilation/hypoxemia in conditions classifiable elsewhere    Stroke [facility]) 11/2008   Treated with TPA? 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 Victorino Dike.   Unspecified cerebral artery occlusion with cerebral infarction    Unspecified venous (peripheral) insufficiency    Vitamin B 12 deficiency     Past Surgical History:  Procedure Laterality Date   ACHILLES TENDON SURGERY Right 04/05/2018   Procedure: Right achilles tendon reconstruction;  Surgeon: Wylene Simmer, MD;  Location: [place];  Service: Orthopedics;  Laterality: Right;  28min   BACK SURGERY     cataracts     both eyes   CHOLECYSTECTOMY     CORONARY ANGIOPLASTY WITH STENT PLACEMENT  1990's   GASTROCNEMIUS RECESSION Right 04/05/2018   Procedure: Gastroc recession;  Surgeon: Wylene Simmer, MD;  Location: [place];  Service: Orthopedics;  Laterality: Right;   LAPAROSCOPIC HYSTERECTOMY     LUMBAR DISC SURGERY     X 3   RIGHT/LEFT HEART CATH AND CORONARY ANGIOGRAPHY N/A 04/10/2017   Procedure: RIGHT/LEFT HEART CATH AND CORONARY ANGIOGRAPHY;  Surgeon: Belva Crome, MD;  Location: [place] [facility];  Service: Cardiovascular;  Laterality: N/A;   TOTAL KNEE ARTHROPLASTY Right 01/14/2019   Procedure: TOTAL KNEE ARTHROPLASTY;  Surgeon: Gaynelle Arabian, MD;  Location: [facility];  Service: Orthopedics;  Laterality: Right;  60min   TOTAL KNEE ARTHROPLASTY Left 12/23/2019   Procedure:  TOTAL KNEE ARTHROPLASTY;  Surgeon: Gaynelle Arabian, MD;  Location: [facility];  Service: Orthopedics;  Laterality: Left;  79min   urethral suspension  1993   Dr. Nori Riis   VARICOSE VEIN SURGERY     x 2     reports that she quit smoking about 23 years ago. Her smoking use included cigarettes. She has a 40.00 pack-year smoking history. She has never used smokeless tobacco. She reports that she does not drink alcohol and does not use drugs.  Allergies  Allergen Reactions   Codeine Itching    REACTION: itch    Family History  Problem Relation Age of Onset   Heart disease Father    Heart attack Father    Alzheimer's disease Mother    Colon cancer Paternal Aunt    Breast cancer Other        cousions   Esophageal cancer Neg Hx    Rectal cancer Neg Hx    Stomach cancer Neg Hx     Prior to Admission medications   Medication Sig Start Date End Date Taking? Authorizing Provider  amLODipine (NORVASC) 5 MG tablet Take 5 mg by mouth every morning. 05/17/21  Yes [provider]  buPROPion (WELLBUTRIN XL) 150 MG 24 hr tablet Take 300 mg by mouth daily. 10/31/15  Yes [provider]  docusate sodium (COLACE) 100 MG capsule Take 100 mg by mouth daily as needed for mild constipation.   Yes [provider]  furosemide (LASIX) 40 MG tablet Take 40 mg by mouth daily as needed for edema.   Yes [provider]  gabapentin (NEURONTIN) 300 MG capsule Take 600 mg by mouth 3 (three) times daily.  10/31/15  Yes [provider]  isosorbide mononitrate (IMDUR) 30 MG 24 hr tablet Take 30 mg by mouth at bedtime.    Yes [provider]  metoprolol tartrate (LOPRESSOR) 50 MG tablet Take 50 mg by mouth 2 (two) times daily.    Yes [provider]  Multiple Vitamin (MULTIVITAMIN) capsule Take 1 capsule by mouth daily.   Yes [provider]  mupirocin ointment (BACTROBAN) 2 % Apply 1 application topically daily. 06/24/21  Yes [provider]   pantoprazole (PROTONIX) 40 MG tablet Take 40 mg by mouth 2 (two) times daily.   Yes [provider]  polyethylene glycol powder (GLYCOLAX/MIRALAX) powder Take 17 g by mouth daily as needed for moderate constipation.    Yes Sable Feil, MD  polyvinyl alcohol (LIQUIFILM TEARS) 1.4 % ophthalmic solution Place 1 drop into both eyes as needed for dry eyes.   Yes [provider]  rosuvastatin (CRESTOR) 10 MG tablet Take 10 mg by mouth at bedtime.   Yes [provider]  bisacodyl (DULCOLAX) 10 MG suppository Place 1 suppository (  10 mg total) rectally daily as needed for moderate constipation. Patient not taking: Reported on 07/06/2021 12/25/19   Edmisten, Drue Dun L, PA  cephALEXin (KEFLEX) 500 MG capsule Take 1 capsule (500 mg total) by mouth 3 (three) times daily. Patient not taking: Reported on 07/06/2021 06/24/21   Landis Martins, DPM  linaclotide Rolan Lipa) 145 MCG CAPS capsule Take 1 capsule (145 mcg total) by mouth daily before breakfast. Patient not taking: Reported on 07/06/2021 06/25/20   Zehr, Laban Emperor, PA-C  nitroGLYCERIN (NITROSTAT) 0.4 MG SL tablet Place 0.4 mg under the tongue every 5 (five) minutes as needed for chest pain (TAKE UP TO 3 DOSES BEFORE CALLING 911). Patient not taking: Reported on 07/06/2021    [provider]  PFIZER COVID-19 [place] injection  04/01/21   [provider]    Physical Exam: Constitutional: Moderately built and nourished. Vitals:   07/07/21 0045 07/07/21 0100 07/07/21 0115 07/07/21 0130  BP:  (!) 151/67  (!) 152/72  Pulse: 68 71 72 72  Resp: 19 17 20  (!) 24  Temp:      TempSrc:      SpO2: 98% 96% 99% 98%  Weight:      Height:       Eyes: Anicteric no pallor. ENMT: No discharge from the ears eyes nose and mouth. Neck: No mass felt.  No neck rigidity. Respiratory: No rhonchi or crepitations. Cardiovascular: S1-S2 heard. Abdomen: Soft nontender bowel sound present. Musculoskeletal: No  edema. Skin: No rash. Neurologic: Alert awake oriented time place and person.  Moves all extremities. Psychiatric: Appears normal.  Normal affect.   Labs on Admission: I have personally reviewed following labs and imaging studies  CBC: Recent Labs  Lab 07/06/21 2139  WBC 7.6  HGB 13.0  HCT 42.3  MCV 90.4  PLT 016*   Basic Metabolic Panel: Recent Labs  Lab 07/06/21 2139  NA 137  K 3.2*  CL 103  CO2 28  GLUCOSE 135*  BUN 14  CREATININE 0.71  CALCIUM 9.4   GFR: Estimated Creatinine Clearance: 63.9 mL/min (by C-G formula based on SCr of 0.71 mg/dL). Liver Function Tests: Recent Labs  Lab 07/06/21 2139  AST 17  ALT 14  ALKPHOS 75  BILITOT 0.6  PROT 7.8  ALBUMIN 4.1   Recent Labs  Lab 07/06/21 2139  LIPASE 25   No results for input(s): AMMONIA in the last 168 hours. Coagulation Profile: No results for input(s): INR, PROTIME in the last 168 hours. Cardiac Enzymes: No results for input(s): CKTOTAL, CKMB, CKMBINDEX, TROPONINI in the last 168 hours. BNP (last 3 results) No results for input(s): PROBNP in the last 8760 hours. HbA1C: No results for input(s): HGBA1C in the last 72 hours. CBG: No results for input(s): GLUCAP in the last 168 hours. Lipid Profile: No results for input(s): CHOL, HDL, LDLCALC, TRIG, CHOLHDL, LDLDIRECT in the last 72 hours. Thyroid Function Tests: No results for input(s): TSH, T4TOTAL, FREET4, T3FREE, THYROIDAB in the last 72 hours. Anemia Panel: No results for input(s): VITAMINB12, FOLATE, FERRITIN, TIBC, IRON, RETICCTPCT in the last 72 hours. Urine analysis:    Component Value Date/Time   COLORURINE STRAW (A) 07/06/2021 2155   APPEARANCEUR CLEAR 07/06/2021 2155   LABSPEC 1.008 07/06/2021 2155   PHURINE 8.0 07/06/2021 2155   GLUCOSEU NEGATIVE 07/06/2021 2155   GLUCOSEU NEGATIVE 09/21/2009 1513   HGBUR SMALL (A) 07/06/2021 2155   BILIRUBINUR NEGATIVE 07/06/2021 2155   KETONESUR 5 (A) 07/06/2021 2155   PROTEINUR NEGATIVE  07/06/2021 2155  UROBILINOGEN 0.2 09/21/2009 1513   NITRITE NEGATIVE 07/06/2021 2155   LEUKOCYTESUR NEGATIVE 07/06/2021 2155   Sepsis Labs: @LABRCNTIP (procalcitonin:4,lacticidven:4) )No results found for this or any previous visit (from the past 240 hour(s)).   Radiological Exams on Admission: CT ABDOMEN PELVIS W CONTRAST  Result Date: 07/06/2021 CLINICAL DATA:  Right lower quadrant abdominal pain. EXAM: CT ABDOMEN AND PELVIS WITH CONTRAST TECHNIQUE: Multidetector CT imaging of the abdomen and pelvis was performed using the standard protocol following bolus administration of intravenous contrast. RADIATION DOSE REDUCTION: This exam was performed according to the departmental dose-optimization program which includes automated exposure control, adjustment of the mA and/or kV according to patient size and/or use of iterative reconstruction technique. CONTRAST:  15mL OMNIPAQUE IOHEXOL 300 MG/ML  SOLN COMPARISON:  CT abdomen pelvis dated 09/15/2017. FINDINGS: Lower chest: The visualized lung bases are clear. Advanced 3 vessel coronary vascular calcification. No intra-abdominal free air or free fluid. Hepatobiliary: The liver is unremarkable. There is mild intrahepatic biliary ductal dilatation, likely post cholecystectomy. Pancreas: Unremarkable. No pancreatic ductal dilatation or surrounding inflammatory changes. Spleen: Normal in size without focal abnormality. Adrenals/Urinary Tract: The adrenal glands unremarkable there is a punctate nonobstructing left renal upper pole calculus. There is mild parenchyma atrophy and diffuse cortical irregularity and scarring. Small bilateral renal hypodense lesions are not characterized. There is no hydronephrosis on either side. There is symmetric enhancement and excretion of contrast by both kidneys. The visualized ureters and urinary bladder appear unremarkable. Stomach/Bowel: Severe sigmoid diverticulosis without active inflammatory changes. Several diverticula in  the small bowel in the mid abdomen measuring up to approximately 2 cm (45/2 and 50/5). There is mild haziness of the surrounding fat concerning for diverticulitis. No diverticular abscess or perforation. There is no bowel obstruction. The appendix is not visualized with certainty. No inflammatory changes identified in the right lower quadrant. Vascular/Lymphatic: Advanced aortoiliac atherosclerotic disease. The IVC is unremarkable. No portal venous gas. There is no adenopathy. Reproductive: Hysterectomy. No adnexal masses. A 2 cm right ovarian cyst. No follow-up imaging recommended. Note: This recommendation does not apply to premenarchal patients and to those with increased risk (genetic, family history, elevated tumor markers or other high-risk factors) of ovarian cancer. Reference: JACR 2020 Feb; 17(2):248-254 Other: None Musculoskeletal: Degenerative changes of the spine. Lower lumbar fusion screws. No acute osseous pathology. IMPRESSION: 1. Findings concerning for diverticulitis of the small bowel in the mid abdomen. No diverticular abscess or perforation. 2. Aortic Atherosclerosis (ICD10-I70.0). Electronically Signed   By: Anner Crete M.D.   On: 07/06/2021 23:30    EKG: Independently reviewed.  Normal sinus rhythm incomplete right bundle branch block.  Assessment/Plan Principal Problem:   Diverticulitis Active Problems:   Essential hypertension   CAD (coronary artery disease)    Diverticulitis involving the small bowel for which at this time we will keep patient on empiric antibiotics n.p.o. IV fluids pain medication we will consult general surgery. History of CAD status post tenting presently NPO.  Denies any chest pain. Hypertension we will keep patient n.p.o. and IV labetalol since patient is n.p.o. History of chronic diastolic CHF presently appears compensated and receiving fluids for abdominal pain with diverticulitis. Thrombocytopenia appears to be chronic. Hypokalemia replace and  recheck. Patient uses oxygen at home for sleep apnea related hypoventilation.  Since patient has significant pain and is involving the diverticulitis of the small bowel will need close monitoring and inpatient status.   DVT prophylaxis: SCDs.  Avoiding anticoagulation for now in case patient needs procedure. Code Status: Full  code. Family Communication: Discussed with patient. Disposition Plan: Home. Consults called: We will consult general surgery. Admission status: Inpatient.   Rise Patience MD [facility] Pager [phone].  If 7PM-7AM, please contact night-coverage [email or web address] Password [email or web address]  07/07/2021, 4:08 AM

## 2021-07-07 NOTE — Assessment & Plan Note (Signed)
follow

## 2021-07-07 NOTE — Assessment & Plan Note (Signed)
Appears euvolemic, follow volume status closely

## 2021-07-07 NOTE — Progress Notes (Signed)
PROGRESS NOTE    [name]  AST:[id number] DOB: 11-24-1940 DOA: 07/06/2021 PCP: Caryl Bis, MD  Chief Complaint  Patient presents with   Abdominal Pain    Brief Narrative:  Stephanie Alvarado is Stephanie Alvarado 81 y.o. female with history of CAD status post stenting, chronic diastolic CHF, hypertension, sleep apnea uses oxygen at home presents to the ER after patient started having severe abdominal pain involving the mid abdomen and right lower quadrant since 2 PM yesterday.  Pain is constant had some episodes of vomiting no blood in the vomitus denies any diarrhea fever or chills.  She's been found to have small bowel diverticulitis.    Assessment & Plan:   Principal Problem:   Diverticulitis Active Problems:   CAD (coronary artery disease)   Essential hypertension   Chronic diastolic CHF (congestive heart failure) (HCC)   Hypokalemia   Thrombocytopenia (HCC)   Chronic respiratory failure with hypoxia, on home oxygen therapy (HCC)   * Diverticulitis- (present on admission) CT with small bowel diverticulitis Continue abx Appreciate surgery   CAD (coronary artery disease)- (present on admission) No CP, follow  Essential hypertension- (present on admission) Amlodipine, imdur, metoprolol  Hypokalemia follow  Chronic diastolic CHF (congestive heart failure) ([place]) Appears euvolemic, follow volume status closely  Thrombocytopenia (HCC) Chronic, follow   Chronic respiratory failure with hypoxia, on home oxygen therapy ([place]) On oxygen at night for sleep apnea   DVT prophylaxis: SCD Code Status: full Family Communication: none Disposition:   Status is: Inpatient  Remains inpatient appropriate because: need for IV abx, surgery c/s       Consultants:  surgery  Procedures:  none  Antimicrobials:  Anti-infectives (From admission, onward)    Start     Dose/Rate Route Frequency Ordered Stop   07/07/21 1200  piperacillin-tazobactam (ZOSYN) IVPB 3.375 g        3.375  g 12.5 mL/hr over 240 Minutes Intravenous Every 8 hours 07/07/21 0439     07/07/21 0415  piperacillin-tazobactam (ZOSYN) IVPB 3.375 g        3.375 g 100 mL/hr over 30 Minutes Intravenous  Once 07/07/21 0413 07/07/21 0505   07/07/21 0000  amoxicillin-clavulanate (AUGMENTIN) 875-125 MG per tablet 1 tablet        1 tablet Oral  Once 07/06/21 2353 07/07/21 0003       Subjective: C/o abdominal pain  Objective: Vitals:   07/07/21 0900 07/07/21 1104 07/07/21 1400 07/07/21 1724  BP: (!) 154/88 (!) 154/81 (!) 171/80 (!) 142/66  Pulse: 60 77 71 75  Resp: 19 (!) 25 17 17   Temp:    98.4 F (36.9 C)  TempSrc:    Oral  SpO2: 97% 92% 100% 99%  Weight:      Height:        Intake/Output Summary (Last 24 hours) at 07/07/2021 1853 Last data filed at 07/07/2021 1741 Gross per 24 hour  Intake 856.23 ml  Output 1000 ml  Net -143.77 ml   Filed Weights   07/06/21 2142  Weight: 90.8 kg    Examination:  General exam: Appears calm and comfortable  Respiratory system: unlabored Cardiovascular system: RRR Gastrointestinal system: Abdomen is nondistended, soft and nontender.  Central nervous system: Alert and oriented. No focal neurological deficits. Extremities: no LEE Skin: No rashes, lesions or ulcers Psychiatry: Judgement and insight appear normal. Mood & affect appropriate.     Data Reviewed: I have personally reviewed following labs and imaging studies  CBC: Recent Labs  Lab 07/06/21  2139 07/07/21 0430  WBC 7.6 7.0  NEUTROABS  --  4.9  HGB 13.0 13.7  HCT 42.3 43.5  MCV 90.4 90.1  PLT 130* 125*    Basic Metabolic Panel: Recent Labs  Lab 07/06/21 2139 07/07/21 0430  NA 137 136  K 3.2* 3.2*  CL 103 103  CO2 28 28  GLUCOSE 135* 108*  BUN 14 10  CREATININE 0.71 0.63  CALCIUM 9.4 8.6*    GFR: Estimated Creatinine Clearance: 63.9 mL/min (by C-G formula based on SCr of 0.63 mg/dL).  Liver Function Tests: Recent Labs  Lab 07/06/21 2139 07/07/21 0430  AST 17 17   ALT 14 12  ALKPHOS 75 65  BILITOT 0.6 0.5  PROT 7.8 6.6  ALBUMIN 4.1 3.5    CBG: No results for input(s): GLUCAP in the last 168 hours.   Recent Results (from the past 240 hour(s))  Resp Panel by RT-PCR (Flu Gino Garrabrant&B, Covid) Nasopharyngeal Swab     Status: None   Collection Time: 07/07/21  2:10 AM   Specimen: Nasopharyngeal Swab; Nasopharyngeal(NP) swabs in vial transport medium  Result Value Ref Range Status   SARS Coronavirus 2 by RT PCR NEGATIVE NEGATIVE Final    Comment: (NOTE) SARS-CoV-2 target nucleic acids are NOT DETECTED.  The SARS-CoV-2 RNA is generally detectable in upper respiratory specimens during the acute phase of infection. The lowest concentration of SARS-CoV-2 viral copies this assay can detect is 138 copies/mL. Hennessey Cantrell negative result does not preclude SARS-Cov-2 infection and should not be used as the sole basis for treatment or other patient management decisions. [place] Folden negative result may occur with  improper specimen collection/handling, submission of specimen other than nasopharyngeal swab, presence of viral mutation(s) within the areas targeted by this assay, and inadequate number of viral copies(<138 copies/mL). Trevaun Rendleman negative result must be combined with clinical observations, patient history, and epidemiological information. The expected result is Negative.  Fact Sheet for Patients:  EntrepreneurPulse.com.au  Fact Sheet for Healthcare Providers:  IncredibleEmployment.be  This test is no t yet approved or cleared by the [place] FDA and  has been authorized for detection and/or diagnosis of SARS-CoV-2 by FDA under an Emergency Use Authorization (EUA). This EUA will remain  in effect (meaning this test can be used) for the duration of the COVID-19 declaration under Section 564(b)(1) of the Act, 21 U.S.C.section 360bbb-3(b)(1), unless the authorization is terminated  or revoked sooner.       Influenza Faven Watterson by PCR NEGATIVE  NEGATIVE Final   Influenza B by PCR NEGATIVE NEGATIVE Final    Comment: (NOTE) The Xpert Xpress SARS-CoV-2/FLU/RSV plus assay is intended as an aid in the diagnosis of influenza from Nasopharyngeal swab specimens and should not be used as Alonda Weaber sole basis for treatment. Nasal washings and aspirates are unacceptable for Xpert Xpress SARS-CoV-2/FLU/RSV testing.  Fact Sheet for Patients: EntrepreneurPulse.com.au  Fact Sheet for Healthcare Providers: IncredibleEmployment.be  This test is not yet approved or cleared by the [place] FDA and has been authorized for detection and/or diagnosis of SARS-CoV-2 by FDA under an Emergency Use Authorization (EUA). This EUA will remain in effect (meaning this test can be used) for the duration of the COVID-19 declaration under Section 564(b)(1) of the Act, 21 U.S.C. section 360bbb-3(b)(1), unless the authorization is terminated or revoked.  Performed at [facility], [place] [address], [place], [place] [address]          Radiology Studies: CT ABDOMEN PELVIS W CONTRAST  Result Date: 07/06/2021 CLINICAL DATA:  Right lower quadrant abdominal pain. EXAM: CT ABDOMEN AND PELVIS WITH CONTRAST TECHNIQUE: Multidetector CT imaging of the abdomen and pelvis was performed using the standard protocol following bolus administration of intravenous contrast. RADIATION DOSE REDUCTION: This exam was performed according to the departmental dose-optimization program which includes automated exposure control, adjustment of the mA and/or kV according to patient size and/or use of iterative reconstruction technique. CONTRAST:  172mL OMNIPAQUE IOHEXOL 300 MG/ML  SOLN COMPARISON:  CT abdomen pelvis dated 09/15/2017. FINDINGS: Lower chest: The visualized lung bases are clear. Advanced 3 vessel coronary vascular calcification. No intra-abdominal free air or free fluid. Hepatobiliary: The liver is unremarkable. There is mild  intrahepatic biliary ductal dilatation, likely post cholecystectomy. Pancreas: Unremarkable. No pancreatic ductal dilatation or surrounding inflammatory changes. Spleen: Normal in size without focal abnormality. Adrenals/Urinary Tract: The adrenal glands unremarkable there is Avenell Sellers punctate nonobstructing left renal upper pole calculus. There is mild parenchyma atrophy and diffuse cortical irregularity and scarring. Small bilateral renal hypodense lesions are not characterized. There is no hydronephrosis on either side. There is symmetric enhancement and excretion of contrast by both kidneys. The visualized ureters and urinary bladder appear unremarkable. Stomach/Bowel: Severe sigmoid diverticulosis without active inflammatory changes. Several diverticula in the small bowel in the mid abdomen measuring up to approximately 2 cm (45/2 and 50/5). There is mild haziness of the surrounding fat concerning for diverticulitis. No diverticular abscess or perforation. There is no bowel obstruction. The appendix is not visualized with certainty. No inflammatory changes identified in the right lower quadrant. Vascular/Lymphatic: Advanced aortoiliac atherosclerotic disease. The IVC is unremarkable. No portal venous gas. There is no adenopathy. Reproductive: Hysterectomy. No adnexal masses. Mataeo Ingwersen 2 cm right ovarian cyst. No follow-up imaging recommended. Note: This recommendation does not apply to premenarchal patients and to those with increased risk (genetic, family history, elevated tumor markers or other high-risk factors) of ovarian cancer. Reference: JACR 2020 Feb; 17(2):248-254 Other: None Musculoskeletal: Degenerative changes of the spine. Lower lumbar fusion screws. No acute osseous pathology. IMPRESSION: 1. Findings concerning for diverticulitis of the small bowel in the mid abdomen. No diverticular abscess or perforation. 2. Aortic Atherosclerosis (ICD10-I70.0). Electronically Signed   By: Anner Crete M.D.   On:  07/06/2021 23:30        Scheduled Meds:  [START ON 07/08/2021] amLODipine  5 mg Oral q morning   [START ON 07/08/2021] buPROPion  300 mg Oral Daily   gabapentin  600 mg Oral TID   isosorbide mononitrate  30 mg Oral QHS   metoprolol tartrate  50 mg Oral BID   pantoprazole  40 mg Oral BID   potassium chloride  40 mEq Oral Q4H   rosuvastatin  10 mg Oral QHS   Continuous Infusions:  lactated ringers 100 mL/hr at 07/07/21 1741   piperacillin-tazobactam (ZOSYN)  IV Stopped (07/07/21 1627)     LOS: 0 days    Time spent: over 65 min    Fayrene Helper, MD [facility]   To contact the attending provider between 7A-7P or the covering provider during after hours 7P-7A, please log into the web site [email or web address] and access using universal [place] password for that web site. If you do not have the password, please call the hospital operator.  07/07/2021, 6:53 PM

## 2021-07-08 ENCOUNTER — Inpatient Hospital Stay (HOSPITAL_COMMUNITY): Payer: PPO

## 2021-07-08 DIAGNOSIS — N179 Acute kidney failure, unspecified: Secondary | ICD-10-CM

## 2021-07-08 LAB — CBC WITH DIFFERENTIAL/PLATELET
Abs Immature Granulocytes: 0.06 10*3/uL (ref 0.00–0.07)
Basophils Absolute: 0.1 10*3/uL (ref 0.0–0.1)
Basophils Relative: 1 %
Eosinophils Absolute: 0.2 10*3/uL (ref 0.0–0.5)
Eosinophils Relative: 4 %
HCT: 44 % (ref 36.0–46.0)
Hemoglobin: 13.6 g/dL (ref 12.0–15.0)
Immature Granulocytes: 1 %
Lymphocytes Relative: 18 %
Lymphs Abs: 1.1 10*3/uL (ref 0.7–4.0)
MCH: 27.7 pg (ref 26.0–34.0)
MCHC: 30.9 g/dL (ref 30.0–36.0)
MCV: 89.6 fL (ref 80.0–100.0)
Monocytes Absolute: 0.9 10*3/uL (ref 0.1–1.0)
Monocytes Relative: 14 %
Neutro Abs: 3.8 10*3/uL (ref 1.7–7.7)
Neutrophils Relative %: 62 %
Platelets: 139 10*3/uL — ABNORMAL LOW (ref 150–400)
RBC: 4.91 MIL/uL (ref 3.87–5.11)
RDW: 17.7 % — ABNORMAL HIGH (ref 11.5–15.5)
WBC: 6.2 10*3/uL (ref 4.0–10.5)
nRBC: 0 % (ref 0.0–0.2)

## 2021-07-08 LAB — COMPREHENSIVE METABOLIC PANEL
ALT: 14 U/L (ref 0–44)
AST: 20 U/L (ref 15–41)
Albumin: 3.3 g/dL — ABNORMAL LOW (ref 3.5–5.0)
Alkaline Phosphatase: 58 U/L (ref 38–126)
Anion gap: 7 (ref 5–15)
BUN: 10 mg/dL (ref 8–23)
CO2: 26 mmol/L (ref 22–32)
Calcium: 9.3 mg/dL (ref 8.9–10.3)
Chloride: 101 mmol/L (ref 98–111)
Creatinine, Ser: 1.28 mg/dL — ABNORMAL HIGH (ref 0.44–1.00)
GFR, Estimated: 42 mL/min — ABNORMAL LOW (ref >60)
Glucose, Bld: 95 mg/dL (ref 70–99)
Potassium: 5.2 mmol/L — ABNORMAL HIGH (ref 3.5–5.1)
Sodium: 134 mmol/L — ABNORMAL LOW (ref 135–145)
Total Bilirubin: 0.8 mg/dL (ref 0.3–1.2)
Total Protein: 6.4 g/dL — ABNORMAL LOW (ref 6.5–8.1)

## 2021-07-08 LAB — BASIC METABOLIC PANEL
Anion gap: 5 (ref 5–15)
BUN: 14 mg/dL (ref 8–23)
CO2: 27 mmol/L (ref 22–32)
Calcium: 9.4 mg/dL (ref 8.9–10.3)
Chloride: 103 mmol/L (ref 98–111)
Creatinine, Ser: 1.43 mg/dL — ABNORMAL HIGH (ref 0.44–1.00)
GFR, Estimated: 37 mL/min — ABNORMAL LOW (ref >60)
Glucose, Bld: 91 mg/dL (ref 70–99)
Potassium: 5.1 mmol/L (ref 3.5–5.1)
Sodium: 135 mmol/L (ref 135–145)

## 2021-07-08 LAB — PHOSPHORUS: Phosphorus: 3.8 mg/dL (ref 2.5–4.6)

## 2021-07-08 LAB — GLUCOSE, CAPILLARY
Glucose-Capillary: 103 mg/dL — ABNORMAL HIGH (ref 70–99)
Glucose-Capillary: 107 mg/dL — ABNORMAL HIGH (ref 70–99)
Glucose-Capillary: 114 mg/dL — ABNORMAL HIGH (ref 70–99)
Glucose-Capillary: 86 mg/dL (ref 70–99)

## 2021-07-08 LAB — MAGNESIUM: Magnesium: 2.2 mg/dL (ref 1.7–2.4)

## 2021-07-08 MED ORDER — LACTATED RINGERS IV SOLN: INTRAVENOUS | Status: AC

## 2021-07-08 NOTE — Clinical Social Work Note (Signed)
°  Transition of Care [facility]) Screening Note   Patient Details  Name: Stephanie Alvarado Date of Birth: 12-Nov-1940   Transition of [facility]) CM/SW Contact:    Trish Mage, LCSW Phone Number: 07/08/2021, 3:28 PM    Transition of Care Department [facility]) has reviewed patient and no TOC needs have been identified at this time. We will continue to monitor patient advancement through interdisciplinary progression rounds. If new patient transition needs arise, please place a TOC consult.

## 2021-07-08 NOTE — Progress Notes (Signed)
PROGRESS NOTE    Stephanie Alvarado  LKG:[id number] DOB: 1941/03/01 DOA: 07/06/2021 PCP: Stephanie Bis, MD  Chief Complaint  Patient presents with   Abdominal Pain    Brief Narrative:  Stephanie Alvarado is Stephanie Alvarado 81 y.o. female with history of CAD status post stenting, chronic diastolic CHF, hypertension, sleep apnea uses oxygen at home presents to the ER after patient started having severe abdominal pain involving the mid abdomen and right lower quadrant since 2 PM yesterday.  Pain is constant had some episodes of vomiting no blood in the vomitus denies any diarrhea fever or chills.  She's been found to have small bowel diverticulitis.    Assessment & Plan:   Principal Problem:   Diverticulitis Active Problems:   CAD (coronary artery disease)   AKI (acute kidney injury) ([place])   Essential hypertension   Chronic diastolic CHF (congestive heart failure) (HCC)   Hypokalemia   Thrombocytopenia (HCC)   Chronic respiratory failure with hypoxia, on home oxygen therapy ([place])   * Diverticulitis- (present on admission) CT with small bowel diverticulitis Continue abx Appreciate surgery assistance   AKI (acute kidney injury) ([place]) AKI today, continue IVF, she doesn't appear overloaded Follow UA and urine studies Possibly contrast related? [place] without hydro UOP decreased, bladder scan - I&O if needed  CAD (coronary artery disease)- (present on admission) No CP, follow  Essential hypertension- (present on admission) Amlodipine, imdur, metoprolol  Hypokalemia follow  Chronic diastolic CHF (congestive heart failure) ([place]) Appears euvolemic, follow volume status closely  Thrombocytopenia (HCC) Chronic, follow   Chronic respiratory failure with hypoxia, on home oxygen therapy ([place]) On oxygen at night for sleep apnea   DVT prophylaxis: SCD Code Status: full Family Communication: none Disposition:   Status is: Inpatient  Remains inpatient appropriate because: need for IV abx,  surgery c/s       Consultants:  surgery  Procedures:  none  Antimicrobials:  Anti-infectives (From admission, onward)    Start     Dose/Rate Route Frequency Ordered Stop   07/07/21 1200  piperacillin-tazobactam (ZOSYN) IVPB 3.375 g        3.375 g 12.5 mL/hr over 240 Minutes Intravenous Every 8 hours 07/07/21 0439     07/07/21 0415  piperacillin-tazobactam (ZOSYN) IVPB 3.375 g        3.375 g 100 mL/hr over 30 Minutes Intravenous  Once 07/07/21 0413 07/07/21 0505   07/07/21 0000  amoxicillin-clavulanate (AUGMENTIN) 875-125 MG per tablet 1 tablet        1 tablet Oral  Once 07/06/21 2353 07/07/21 0003       Subjective: Notes decreased UOP  Objective: Vitals:   07/08/21 0200 07/08/21 0531 07/08/21 1100 07/08/21 1335  BP: (!) 93/56 (!) 98/54 (!) 102/59 (!) 102/54  Pulse: 60 65 72 64  Resp: 20 20  16   Temp: (!) 97.4 F (36.3 C) 98.5 F (36.9 C)  97.6 F (36.4 C)  TempSrc: Oral   Oral  SpO2: 98% 97%  96%  Weight:      Height:        Intake/Output Summary (Last 24 hours) at 07/08/2021 1907 Last data filed at 07/08/2021 1726 Gross per 24 hour  Intake 150 ml  Output --  Net 150 ml   Filed Weights   07/06/21 2142  Weight: 90.8 kg    Examination:  General: No acute distress. Cardiovascular: RRR Lungs: unlabored Abdomen: Soft, nontender, nondistended  Neurological: Alert and oriented 3. Moves all extremities 4 . Cranial nerves II through  XII grossly intact. Skin: Warm and dry. No rashes or lesions. Extremities: No clubbing or cyanosis. No edema.   Data Reviewed: I have personally reviewed following labs and imaging studies  CBC: Recent Labs  Lab 07/06/21 2139 07/07/21 0430 07/08/21 0433  WBC 7.6 7.0 6.2  NEUTROABS  --  4.9 3.8  HGB 13.0 13.7 13.6  HCT 42.3 43.5 44.0  MCV 90.4 90.1 89.6  PLT 130* 125* 139*    Basic Metabolic Panel: Recent Labs  Lab 07/06/21 2139 07/07/21 0430 07/08/21 0433 07/08/21 0854  NA 137 136 134* 135  K 3.2* 3.2*  5.2* 5.1  CL 103 103 101 103  CO2 28 28 26 27   GLUCOSE 135* 108* 95 91  BUN 14 10 10 14   CREATININE 0.71 0.63 1.28* 1.43*  CALCIUM 9.4 8.6* 9.3 9.4  MG  --   --  2.2  --   PHOS  --   --  3.8  --     GFR: Estimated Creatinine Clearance: 35.8 mL/min (Stephanie Alvarado) (by C-G formula based on SCr of 1.43 mg/dL (H)).  Liver Function Tests: Recent Labs  Lab 07/06/21 2139 07/07/21 0430 07/08/21 0433  AST 17 17 20   ALT 14 12 14   ALKPHOS 75 65 58  BILITOT 0.6 0.5 0.8  PROT 7.8 6.6 6.4*  ALBUMIN 4.1 3.5 3.3*    CBG: Recent Labs  Lab 07/08/21 0044 07/08/21 0807 07/08/21 1607  GLUCAP 114* 103* 107*     Recent Results (from the past 240 hour(s))  Resp Panel by RT-PCR (Flu Stephanie Alvarado&B, Covid) Nasopharyngeal Swab     Status: None   Collection Time: 07/07/21  2:10 AM   Specimen: Nasopharyngeal Swab; Nasopharyngeal(NP) swabs in vial transport medium  Result Value Ref Range Status   SARS Coronavirus 2 by RT PCR NEGATIVE NEGATIVE Final    Comment: (NOTE) SARS-CoV-2 target nucleic acids are NOT DETECTED.  The SARS-CoV-2 RNA is generally detectable in upper respiratory specimens during the acute phase of infection. The lowest concentration of SARS-CoV-2 viral copies this assay can detect is 138 copies/mL. Stephanie Alvarado negative result does not preclude SARS-Cov-2 infection and should not be used as the sole basis for treatment or other patient management decisions. Stephanie Alvarado negative result may occur with  improper specimen collection/handling, submission of specimen other than nasopharyngeal swab, presence of viral mutation(s) within the areas targeted by this assay, and inadequate number of viral copies(<138 copies/mL). Stephanie Alvarado negative result must be combined with clinical observations, patient history, and epidemiological information. The expected result is Negative.  Fact Sheet for Patients:  EntrepreneurPulse.com.au  Fact Sheet for Healthcare Providers:   IncredibleEmployment.be  This test is no t yet approved or cleared by the [place] FDA and  has been authorized for detection and/or diagnosis of SARS-CoV-2 by FDA under an Emergency Use Authorization (EUA). This EUA will remain  in effect (meaning this test can be used) for the duration of the COVID-19 declaration under Section 564(b)(1) of the Act, 21 U.S.C.section 360bbb-3(b)(1), unless the authorization is terminated  or revoked sooner.       Influenza Zephyr Sausedo by PCR NEGATIVE NEGATIVE Final   Influenza B by PCR NEGATIVE NEGATIVE Final    Comment: (NOTE) The Xpert Xpress SARS-CoV-2/FLU/RSV plus assay is intended as an aid in the diagnosis of influenza from Nasopharyngeal swab specimens and should not be used as Alynah Schone sole basis for treatment. Nasal washings and aspirates are unacceptable for Xpert Xpress SARS-CoV-2/FLU/RSV testing.  Fact Sheet for Patients: EntrepreneurPulse.com.au  Fact Sheet for  Healthcare Providers: IncredibleEmployment.be  This test is not yet approved or cleared by the [place] and has been authorized for detection and/or diagnosis of SARS-CoV-2 by FDA under an Emergency Use Authorization (EUA). This EUA will remain in effect (meaning this test can be used) for the duration of the COVID-19 declaration under Section 564(b)(1) of the Act, 21 U.S.C. section 360bbb-3(b)(1), unless the authorization is terminated or revoked.  Performed at [facility], [place] [address] [place], [place] [address]          Radiology Studies: CT ABDOMEN PELVIS W CONTRAST  Result Date: 07/06/2021 CLINICAL DATA:  Right lower quadrant abdominal pain. EXAM: CT ABDOMEN AND PELVIS WITH CONTRAST TECHNIQUE: Multidetector CT imaging of the abdomen and pelvis was performed using the standard protocol following bolus administration of intravenous contrast. RADIATION DOSE REDUCTION: This exam was performed  according to the departmental dose-optimization program which includes automated exposure control, adjustment of the mA and/or kV according to patient size and/or use of iterative reconstruction technique. CONTRAST:  159mL OMNIPAQUE IOHEXOL 300 MG/ML  SOLN COMPARISON:  CT abdomen pelvis dated 09/15/2017. FINDINGS: Lower chest: The visualized lung bases are clear. Advanced 3 vessel coronary vascular calcification. No intra-abdominal free air or free fluid. Hepatobiliary: The liver is unremarkable. There is mild intrahepatic biliary ductal dilatation, likely post cholecystectomy. Pancreas: Unremarkable. No pancreatic ductal dilatation or surrounding inflammatory changes. Spleen: Normal in size without focal abnormality. Adrenals/Urinary Tract: The adrenal glands unremarkable there is Isham Smitherman punctate nonobstructing left renal upper pole calculus. There is mild parenchyma atrophy and diffuse cortical irregularity and scarring. Small bilateral renal hypodense lesions are not characterized. There is no hydronephrosis on either side. There is symmetric enhancement and excretion of contrast by both kidneys. The visualized ureters and urinary bladder appear unremarkable. Stomach/Bowel: Severe sigmoid diverticulosis without active inflammatory changes. Several diverticula in the small bowel in the mid abdomen measuring up to approximately 2 cm (45/2 and 50/5). There is mild haziness of the surrounding fat concerning for diverticulitis. No diverticular abscess or perforation. There is no bowel obstruction. The appendix is not visualized with certainty. No inflammatory changes identified in the right lower quadrant. Vascular/Lymphatic: Advanced aortoiliac atherosclerotic disease. The IVC is unremarkable. No portal venous gas. There is no adenopathy. Reproductive: Hysterectomy. No adnexal masses. Shanai Lartigue 2 cm right ovarian cyst. No follow-up imaging recommended. Note: This recommendation does not apply to premenarchal patients and to those  with increased risk (genetic, family history, elevated tumor markers or other high-risk factors) of ovarian cancer. Reference: JACR 2020 Feb; 17(2):248-254 Other: None Musculoskeletal: Degenerative changes of the spine. Lower lumbar fusion screws. No acute osseous pathology. IMPRESSION: 1. Findings concerning for diverticulitis of the small bowel in the mid abdomen. No diverticular abscess or perforation. 2. Aortic Atherosclerosis (ICD10-I70.0). Electronically Signed   By: Anner Crete M.D.   On: 07/06/2021 23:30   US RENAL  Result Date: 07/08/2021 CLINICAL DATA:  Acute kidney injury. EXAM: RENAL / URINARY TRACT ULTRASOUND COMPLETE COMPARISON:  Abdomen and pelvis CT dated 09/15/2017 FINDINGS: Right Kidney: Renal measurements: 8.3 x 4.6 x 4.6 cm = volume: 93 mL. Normal echotexture. No hydronephrosis. Left Kidney: Renal measurements: 9.8 x 4.6 x 4.3 cm = volume: 100 mL. Normal echotexture. 1.5 cm partially exophytic simple appearing cyst. No hydronephrosis. Bladder: Appears normal for degree of bladder distention. Other: None. IMPRESSION: No significant abnormality. Electronically Signed   By: Claudie Revering M.D.   On: 07/08/2021 14:30        Scheduled Meds:  amLODipine  5 mg Oral q morning   buPROPion  300 mg Oral Daily   gabapentin  600 mg Oral TID   isosorbide mononitrate  30 mg Oral QHS   metoprolol tartrate  50 mg Oral BID   pantoprazole  40 mg Oral BID   rosuvastatin  10 mg Oral QHS   Continuous Infusions:  lactated ringers 100 mL/hr at 07/08/21 1726   piperacillin-tazobactam (ZOSYN)  IV 3.375 g (07/08/21 1301)     LOS: 1 day    Time spent: over 40 min    Fayrene Helper, MD [facility]   To contact the attending provider between 7A-7P or the covering provider during after hours 7P-7A, please log into the web site [email or web address] and access using universal [place] password for that web site. If you do not have the password, please call the hospital  operator.  07/08/2021, 7:07 PM

## 2021-07-08 NOTE — Progress Notes (Signed)
°   07/08/21 1200  Pain Assessment  Pain Assessment 0-10  Pain Score 8  Pain Location abdomen  Pain Descriptors / Indicators Sharp  Mobility  Activity Ambulated with assistance in hallway  Level of Assistance Contact guard assist, steadying assist  Assistive Device Front wheel walker  Distance Ambulated (ft) 200 ft  Activity Response Tolerated well  $Mobility charge 1 Mobility   Pt agreeable to mobilize this morning. She noted that when she stands, her abdominal pain increases from 6 to 8 out of 10. However, she was still agreeable to session. Pt also noted that she does not wear O2 when ambulating. Ambulated in hall with RW, tolerated well. No complaints aside from abdominal pain. Left pt in bed with call bell at side.   [place] Specialist Acute Rehab Services Office: [phone]

## 2021-07-08 NOTE — Assessment & Plan Note (Addendum)
AKI is improving with IVF Follow UA ([place]) and urine studies Possibly contrast related? [place] without hydro Continue IVF for now Improved

## 2021-07-08 NOTE — Progress Notes (Signed)
Patient ID: Stephanie Alvarado, female   DOB: August 09, 1940, 81 y.o.   MRN: [id number] [facility] Surgery Progress Note     Subjective: CC-  Feeling a little better today. Still having some RLQ pain but it is less. Denies n/v. Passing flatus, no BM. WBC 6.2, afebrile.  Objective: Vital signs in last 24 hours: Temp:  [97.4 F (36.3 C)-98.5 F (36.9 C)] 98.5 F (36.9 C) (01/26 0531) Pulse Rate:  [60-77] 65 (01/26 0531) Resp:  [17-25] 20 (01/26 0531) BP: (93-171)/(54-81) 98/54 (01/26 0531) SpO2:  [92 %-100 %] 97 % (01/26 0531) Last BM Date: 07/06/21  Intake/Output from previous day: 01/25 0701 - 01/26 0700 In: 806.2 [I.V.:756.2; IV Piggyback:50] Out: -  Intake/Output this shift: No intake/output data recorded.  PE: Abd: soft, ND, +BS, no masses, hernias, or organomegaly. Mild RLQ TTP without rebound or guarding  Lab Results:  Recent Labs    07/07/21 0430 07/08/21 0433  WBC 7.0 6.2  HGB 13.7 13.6  HCT 43.5 44.0  PLT 125* 139*   BMET Recent Labs    07/08/21 0433 07/08/21 0854  NA 134* 135  K 5.2* 5.1  CL 101 103  CO2 26 27  GLUCOSE 95 91  BUN 10 14  CREATININE 1.28* 1.43*  CALCIUM 9.3 9.4   PT/INR No results for input(s): LABPROT, INR in the last 72 hours. CMP     Component Value Date/Time   NA 135 07/08/2021 0854   NA 143 03/07/2019 1534   K 5.1 07/08/2021 0854   CL 103 07/08/2021 0854   CO2 27 07/08/2021 0854   GLUCOSE 91 07/08/2021 0854   BUN 14 07/08/2021 0854   BUN 13 03/07/2019 1534   CREATININE 1.43 (H) 07/08/2021 0854   CALCIUM 9.4 07/08/2021 0854   PROT 6.4 (L) 07/08/2021 0433   ALBUMIN 3.3 (L) 07/08/2021 0433   AST 20 07/08/2021 0433   ALT 14 07/08/2021 0433   ALKPHOS 58 07/08/2021 0433   BILITOT 0.8 07/08/2021 0433   GFRNONAA 37 (L) 07/08/2021 0854   GFRAA >60 12/25/2019 0313   Lipase     Component Value Date/Time   LIPASE 25 07/06/2021 2139       Studies/Results: CT ABDOMEN PELVIS W CONTRAST  Result Date:  07/06/2021 CLINICAL DATA:  Right lower quadrant abdominal pain. EXAM: CT ABDOMEN AND PELVIS WITH CONTRAST TECHNIQUE: Multidetector CT imaging of the abdomen and pelvis was performed using the standard protocol following bolus administration of intravenous contrast. RADIATION DOSE REDUCTION: This exam was performed according to the departmental dose-optimization program which includes automated exposure control, adjustment of the mA and/or kV according to patient size and/or use of iterative reconstruction technique. CONTRAST:  186mL OMNIPAQUE IOHEXOL 300 MG/ML  SOLN COMPARISON:  CT abdomen pelvis dated 09/15/2017. FINDINGS: Lower chest: The visualized lung bases are clear. Advanced 3 vessel coronary vascular calcification. No intra-abdominal free air or free fluid. Hepatobiliary: The liver is unremarkable. There is mild intrahepatic biliary ductal dilatation, likely post cholecystectomy. Pancreas: Unremarkable. No pancreatic ductal dilatation or surrounding inflammatory changes. Spleen: Normal in size without focal abnormality. Adrenals/Urinary Tract: The adrenal glands unremarkable there is a punctate nonobstructing left renal upper pole calculus. There is mild parenchyma atrophy and diffuse cortical irregularity and scarring. Small bilateral renal hypodense lesions are not characterized. There is no hydronephrosis on either side. There is symmetric enhancement and excretion of contrast by both kidneys. The visualized ureters and urinary bladder appear unremarkable. Stomach/Bowel: Severe sigmoid diverticulosis without active inflammatory changes. Several diverticula in  the small bowel in the mid abdomen measuring up to approximately 2 cm (45/2 and 50/5). There is mild haziness of the surrounding fat concerning for diverticulitis. No diverticular abscess or perforation. There is no bowel obstruction. The appendix is not visualized with certainty. No inflammatory changes identified in the right lower quadrant.  Vascular/Lymphatic: Advanced aortoiliac atherosclerotic disease. The IVC is unremarkable. No portal venous gas. There is no adenopathy. Reproductive: Hysterectomy. No adnexal masses. A 2 cm right ovarian cyst. No follow-up imaging recommended. Note: This recommendation does not apply to premenarchal patients and to those with increased risk (genetic, family history, elevated tumor markers or other high-risk factors) of ovarian cancer. Reference: JACR 2020 Feb; 17(2):248-254 Other: None Musculoskeletal: Degenerative changes of the spine. Lower lumbar fusion screws. No acute osseous pathology. IMPRESSION: 1. Findings concerning for diverticulitis of the small bowel in the mid abdomen. No diverticular abscess or perforation. 2. Aortic Atherosclerosis (ICD10-I70.0). Electronically Signed   By: Anner Crete M.D.   On: 07/06/2021 23:30    Anti-infectives: Anti-infectives (From admission, onward)    Start     Dose/Rate Route Frequency Ordered Stop   07/07/21 1200  piperacillin-tazobactam (ZOSYN) IVPB 3.375 g        3.375 g 12.5 mL/hr over 240 Minutes Intravenous Every 8 hours 07/07/21 0439     07/07/21 0415  piperacillin-tazobactam (ZOSYN) IVPB 3.375 g        3.375 g 100 mL/hr over 30 Minutes Intravenous  Once 07/07/21 0413 07/07/21 0505   07/07/21 0000  amoxicillin-clavulanate (AUGMENTIN) 875-125 MG per tablet 1 tablet        1 tablet Oral  Once 07/06/21 2353 07/07/21 0003        Assessment/Plan Small bowel diverticulitis - CT scan 1/24 showed diverticulitis of the small bowel in the mid abdomen with no diverticular abscess or perforation - Patient is still a little tender on abdominal exam but pain is improving, WBC WNL, afebrile. Continue clear liquids and IV antibiotics today. Mobilize.    ID - zosyn 1/25>> VTE - SCDs, ok for chemical dvt ppx from surgical standpoint FEN - IVF, CLD Foley - none   CAD status post stenting Chronic diastolic CHF HTN HLD Sleep apnea, uses home oxygen  2-3L PRN  Straightforward Medical Decision Making   LOS: 1 day    Wellington Hampshire, [facility] Surgery 07/08/2021, 10:51 AM Please see Amion for pager number during day hours 7:00am-4:30pm

## 2021-07-09 LAB — CBC WITH DIFFERENTIAL/PLATELET
Abs Immature Granulocytes: 0.07 10*3/uL (ref 0.00–0.07)
Basophils Absolute: 0.1 10*3/uL (ref 0.0–0.1)
Basophils Relative: 1 %
Eosinophils Absolute: 0.3 10*3/uL (ref 0.0–0.5)
Eosinophils Relative: 4 %
HCT: 44.4 % (ref 36.0–46.0)
Hemoglobin: 13.4 g/dL (ref 12.0–15.0)
Immature Granulocytes: 1 %
Lymphocytes Relative: 21 %
Lymphs Abs: 1.5 10*3/uL (ref 0.7–4.0)
MCH: 27.7 pg (ref 26.0–34.0)
MCHC: 30.2 g/dL (ref 30.0–36.0)
MCV: 91.9 fL (ref 80.0–100.0)
Monocytes Absolute: 1 10*3/uL (ref 0.1–1.0)
Monocytes Relative: 14 %
Neutro Abs: 4.5 10*3/uL (ref 1.7–7.7)
Neutrophils Relative %: 59 %
Platelets: 126 10*3/uL — ABNORMAL LOW (ref 150–400)
RBC: 4.83 MIL/uL (ref 3.87–5.11)
RDW: 18.1 % — ABNORMAL HIGH (ref 11.5–15.5)
WBC: 7.5 10*3/uL (ref 4.0–10.5)
nRBC: 0 % (ref 0.0–0.2)

## 2021-07-09 LAB — COMPREHENSIVE METABOLIC PANEL
ALT: 15 U/L (ref 0–44)
AST: 16 U/L (ref 15–41)
Albumin: 3.3 g/dL — ABNORMAL LOW (ref 3.5–5.0)
Alkaline Phosphatase: 56 U/L (ref 38–126)
Anion gap: 5 (ref 5–15)
BUN: 15 mg/dL (ref 8–23)
CO2: 30 mmol/L (ref 22–32)
Calcium: 9.6 mg/dL (ref 8.9–10.3)
Chloride: 101 mmol/L (ref 98–111)
Creatinine, Ser: 1.15 mg/dL — ABNORMAL HIGH (ref 0.44–1.00)
GFR, Estimated: 48 mL/min — ABNORMAL LOW (ref >60)
Glucose, Bld: 98 mg/dL (ref 70–99)
Potassium: 4.8 mmol/L (ref 3.5–5.1)
Sodium: 136 mmol/L (ref 135–145)
Total Bilirubin: 0.8 mg/dL (ref 0.3–1.2)
Total Protein: 6.4 g/dL — ABNORMAL LOW (ref 6.5–8.1)

## 2021-07-09 LAB — GLUCOSE, CAPILLARY
Glucose-Capillary: 105 mg/dL — ABNORMAL HIGH (ref 70–99)
Glucose-Capillary: 106 mg/dL — ABNORMAL HIGH (ref 70–99)
Glucose-Capillary: 157 mg/dL — ABNORMAL HIGH (ref 70–99)

## 2021-07-09 LAB — URINALYSIS, COMPLETE (UACMP) WITH MICROSCOPIC
Bilirubin Urine: NEGATIVE
Glucose, UA: NEGATIVE mg/dL
Hgb urine dipstick: NEGATIVE
Ketones, ur: NEGATIVE mg/dL
Leukocytes,Ua: NEGATIVE
Nitrite: NEGATIVE
Protein, ur: NEGATIVE mg/dL
Specific Gravity, Urine: 1.01 (ref 1.005–1.030)
pH: 5 (ref 5.0–8.0)

## 2021-07-09 LAB — CREATININE, URINE, RANDOM: Creatinine, Urine: 63.39 mg/dL

## 2021-07-09 LAB — MAGNESIUM: Magnesium: 2.3 mg/dL (ref 1.7–2.4)

## 2021-07-09 LAB — PHOSPHORUS: Phosphorus: 3.5 mg/dL (ref 2.5–4.6)

## 2021-07-09 LAB — SODIUM, URINE, RANDOM: Sodium, Ur: 14 mmol/L

## 2021-07-09 MED ORDER — BOOST / RESOURCE BREEZE PO LIQD CUSTOM
1.0000 | Freq: Three times a day (TID) | ORAL | Status: DC
  Administered 2021-07-09 – 2021-07-16 (×5): 1 via ORAL

## 2021-07-09 NOTE — Progress Notes (Signed)
PT Cancellation Note  Patient Details Name: Stephanie Alvarado MRN: [id number] DOB: 07-07-1940   Cancelled Treatment:     PT order received but eval deferred at request of pt 2* fatigue.  Pt agreeable to PT in am.  Will follow.   Atzel Mccambridge 07/09/2021, 6:27 PM

## 2021-07-09 NOTE — Progress Notes (Signed)
Patient ID: Stephanie Alvarado, female   DOB: 1941-03-27, 81 y.o.   MRN: [id number] [facility] Surgery Progress Note     Subjective: CC-  Still having RLQ pain requiring some pain medication. States that it is overall improving. Denies n/v. Passing flatus, no BM. Tolerating clear liquids.  Objective: Vital signs in last 24 hours: Temp:  [97.6 F (36.4 C)-98.2 F (36.8 C)] 98.2 F (36.8 C) (01/27 0456) Pulse Rate:  [64-79] 79 (01/27 0456) Resp:  [16-18] 18 (01/27 0456) BP: (102-133)/(54-62) 133/62 (01/27 0456) SpO2:  [96 %-99 %] 99 % (01/27 0456) Last BM Date: 07/06/21  Intake/Output from previous day: 01/26 0701 - 01/27 0700 In: 1351.8 [I.V.:1151.8; IV Piggyback:200] Out: 600 [Urine:600] Intake/Output this shift: Total I/O In: 356 [P.O.:356] Out: 200 [Urine:200]  PE: Abd: soft, ND, +BS, no masses, hernias, or organomegaly. Mild RLQ TTP without rebound or guarding  Lab Results:  Recent Labs    07/08/21 0433 07/09/21 0509  WBC 6.2 7.5  HGB 13.6 13.4  HCT 44.0 44.4  PLT 139* 126*   BMET Recent Labs    07/08/21 0854 07/09/21 0509  NA 135 136  K 5.1 4.8  CL 103 101  CO2 27 30  GLUCOSE 91 98  BUN 14 15  CREATININE 1.43* 1.15*  CALCIUM 9.4 9.6   PT/INR No results for input(s): LABPROT, INR in the last 72 hours. CMP     Component Value Date/Time   NA 136 07/09/2021 0509   NA 143 03/07/2019 1534   K 4.8 07/09/2021 0509   CL 101 07/09/2021 0509   CO2 30 07/09/2021 0509   GLUCOSE 98 07/09/2021 0509   BUN 15 07/09/2021 0509   BUN 13 03/07/2019 1534   CREATININE 1.15 (H) 07/09/2021 0509   CALCIUM 9.6 07/09/2021 0509   PROT 6.4 (L) 07/09/2021 0509   ALBUMIN 3.3 (L) 07/09/2021 0509   AST 16 07/09/2021 0509   ALT 15 07/09/2021 0509   ALKPHOS 56 07/09/2021 0509   BILITOT 0.8 07/09/2021 0509   GFRNONAA 48 (L) 07/09/2021 0509   GFRAA >60 12/25/2019 0313   Lipase     Component Value Date/Time   LIPASE 25 07/06/2021 2139       Studies/Results: US  RENAL  Result Date: 07/08/2021 CLINICAL DATA:  Acute kidney injury. EXAM: RENAL / URINARY TRACT ULTRASOUND COMPLETE COMPARISON:  Abdomen and pelvis CT dated 09/15/2017 FINDINGS: Right Kidney: Renal measurements: 8.3 x 4.6 x 4.6 cm = volume: 93 mL. Normal echotexture. No hydronephrosis. Left Kidney: Renal measurements: 9.8 x 4.6 x 4.3 cm = volume: 100 mL. Normal echotexture. 1.5 cm partially exophytic simple appearing cyst. No hydronephrosis. Bladder: Appears normal for degree of bladder distention. Other: None. IMPRESSION: No significant abnormality. Electronically Signed   By: Claudie Revering M.D.   On: 07/08/2021 14:30   DG CHEST PORT 1 VIEW  Result Date: 07/08/2021 CLINICAL DATA:  Shortness of breath EXAM: PORTABLE CHEST 1 VIEW COMPARISON:  Chest radiograph done on 11/14/2013, CT done on 05/31/2016 FINDINGS: Cardiac size is within normal limits. There is poor inspiration. Central pulmonary vessels are prominent without signs of pulmonary edema. There is no focal pulmonary consolidation. There are small linear densities in the right paratracheal region and right lower lung fields. Surgical clips are seen in the left paratracheal region. IMPRESSION: Central pulmonary vessels are prominent without signs of alveolar pulmonary edema. There is no focal pulmonary consolidation. Small linear densities in the right mid and right lower lung fields may suggest subsegmental atelectasis.  Electronically Signed   By: Elmer Picker M.D.   On: 07/08/2021 21:00    Anti-infectives: Anti-infectives (From admission, onward)    Start     Dose/Rate Route Frequency Ordered Stop   07/07/21 1200  piperacillin-tazobactam (ZOSYN) IVPB 3.375 g        3.375 g 12.5 mL/hr over 240 Minutes Intravenous Every 8 hours 07/07/21 0439     07/07/21 0415  piperacillin-tazobactam (ZOSYN) IVPB 3.375 g        3.375 g 100 mL/hr over 30 Minutes Intravenous  Once 07/07/21 0413 07/07/21 0505   07/07/21 0000  amoxicillin-clavulanate  (AUGMENTIN) 875-125 MG per tablet 1 tablet        1 tablet Oral  Once 07/06/21 2353 07/07/21 0003        Assessment/Plan Small bowel diverticulitis - CT scan 1/24 showed diverticulitis of the small bowel in the mid abdomen with no diverticular abscess or perforation - Improving but some persistent RLQ tenderness on exam. WBC WNL, afebrile. Continue clear liquids and IV antibiotics today. Add Boost breeze. Mobilize.    ID - zosyn 1/25>> VTE - SCDs, ok for chemical dvt ppx from surgical standpoint FEN - IVF, CLD Foley - none   CAD status post stenting Chronic diastolic CHF HTN HLD Sleep apnea, uses home oxygen 2-3L PRN  Straightforward Medical Decision Making   LOS: 2 days    Wellington Hampshire, [facility] Surgery 07/09/2021, 12:26 PM Please see Amion for pager number during day hours 7:00am-4:30pm

## 2021-07-09 NOTE — Progress Notes (Signed)
PROGRESS NOTE    Stephanie Alvarado  UYQ:[id number] DOB: September 25, 1940 DOA: 07/06/2021 PCP: Caryl Bis, MD  Chief Complaint  Patient presents with   Abdominal Pain    Brief Narrative:  Stephanie Alvarado is [name] 81 y.o. female with history of CAD status post stenting, chronic diastolic CHF, hypertension, sleep apnea uses oxygen at home presents to the ER after patient started having severe abdominal pain involving the mid abdomen and right lower quadrant since 2 PM yesterday.  Pain is constant had some episodes of vomiting no blood in the vomitus denies any diarrhea fever or chills.  She's been found to have small bowel diverticulitis.    Assessment & Plan:   Principal Problem:   Diverticulitis Active Problems:   CAD (coronary artery disease)   AKI (acute kidney injury) ([place])   Essential hypertension   Chronic diastolic CHF (congestive heart failure) (HCC)   Hypokalemia   Thrombocytopenia (HCC)   Chronic respiratory failure with hypoxia, on home oxygen therapy (Stephanie Alvarado)   * Diverticulitis- (present on admission) CT with small bowel diverticulitis Continue abx Appreciate surgery assistance Continue CLD   AKI (acute kidney injury) ([place]) AKI is improving with IVF Follow UA ([place]) and urine studies Possibly contrast related? [place] without hydro Continue IVF for now  CAD (coronary artery disease)- (present on admission) No CP, follow  Essential hypertension- (present on admission) Amlodipine, imdur, metoprolol  Hypokalemia follow  Chronic diastolic CHF (congestive heart failure) (HCC) Appears euvolemic, follow volume status closely  Thrombocytopenia (HCC) Chronic, follow   Chronic respiratory failure with hypoxia, on home oxygen therapy ([name]) On oxygen at night for sleep apnea   DVT prophylaxis: SCD Code Status: full Family Communication: none Disposition:   Status is: Inpatient  Remains inpatient appropriate because: need for IV abx, surgery c/s       Consultants:   surgery  Procedures:  none  Antimicrobials:  Anti-infectives (From admission, onward)    Start     Dose/Rate Route Frequency Ordered Stop   07/07/21 1200  piperacillin-tazobactam (ZOSYN) IVPB 3.375 g        3.375 g 12.5 mL/hr over 240 Minutes Intravenous Every 8 hours 07/07/21 0439     07/07/21 0415  piperacillin-tazobactam (ZOSYN) IVPB 3.375 g        3.375 g 100 mL/hr over 30 Minutes Intravenous  Once 07/07/21 0413 07/07/21 0505   07/07/21 0000  amoxicillin-clavulanate (AUGMENTIN) 875-125 MG per tablet 1 tablet        1 tablet Oral  Once 07/06/21 2353 07/07/21 0003       Subjective: C/o more RLQ pain today  Objective: Vitals:   07/08/21 1335 07/08/21 1913 07/09/21 0456 07/09/21 1354  BP: (!) 102/54 (!) 124/54 133/62 100/88  Pulse: 64 71 79 71  Resp: 16 18 18 17   Temp: 97.6 F (36.4 C) 97.7 F (36.5 C) 98.2 F (36.8 C) 97.8 F (36.6 C)  TempSrc: Oral Oral Oral Oral  SpO2: 96% 97% 99% 98%  Weight:      Height:        Intake/Output Summary (Last 24 hours) at 07/09/2021 1456 Last data filed at 07/09/2021 1300 Gross per 24 hour  Intake 2179.77 ml  Output 800 ml  Net 1379.77 ml   Filed Weights   07/06/21 2142  Weight: 90.8 kg    Examination:  General: No acute distress. Cardiovascular: RRR Lungs: unlabored Abdomen: mild RLQ TTP Neurological: Alert and oriented 3. Moves all extremities 4 . Cranial nerves II through XII grossly  intact. Skin: Warm and dry. No rashes or lesions. Extremities: No clubbing or cyanosis. No edema  Data Reviewed: I have personally reviewed following labs and imaging studies  CBC: Recent Labs  Lab 07/06/21 2139 07/07/21 0430 07/08/21 0433 07/09/21 0509  WBC 7.6 7.0 6.2 7.5  NEUTROABS  --  4.9 3.8 4.5  HGB 13.0 13.7 13.6 13.4  HCT 42.3 43.5 44.0 44.4  MCV 90.4 90.1 89.6 91.9  PLT 130* 125* 139* 126*    Basic Metabolic Panel: Recent Labs  Lab 07/06/21 2139 07/07/21 0430 07/08/21 0433 07/08/21 0854 07/09/21 0509   NA 137 136 134* 135 136  K 3.2* 3.2* 5.2* 5.1 4.8  CL 103 103 101 103 101  CO2 28 28 26 27 30   GLUCOSE 135* 108* 95 91 98  BUN 14 10 10 14 15   CREATININE 0.71 0.63 1.28* 1.43* 1.15*  CALCIUM 9.4 8.6* 9.3 9.4 9.6  MG  --   --  2.2  --  2.3  PHOS  --   --  3.8  --  3.5    GFR: Estimated Creatinine Clearance: 44.5 mL/min (Ritu Gagliardo) (by C-G formula based on SCr of 1.15 mg/dL (H)).  Liver Function Tests: Recent Labs  Lab 07/06/21 2139 07/07/21 0430 07/08/21 0433 07/09/21 0509  AST 17 17 20 16   ALT 14 12 14 15   ALKPHOS 75 65 58 56  BILITOT 0.6 0.5 0.8 0.8  PROT 7.8 6.6 6.4* 6.4*  ALBUMIN 4.1 3.5 3.3* 3.3*    CBG: Recent Labs  Lab 07/08/21 0044 07/08/21 0807 07/08/21 1607 07/08/21 2356 07/09/21 0720  GLUCAP 114* 103* 107* 86 157*     Recent Results (from the past 240 hour(s))  Resp Panel by RT-PCR (Flu Tykel Badie&B, Covid) Nasopharyngeal Swab     Status: None   Collection Time: 07/07/21  2:10 AM   Specimen: Nasopharyngeal Swab; Nasopharyngeal(NP) swabs in vial transport medium  Result Value Ref Range Status   SARS Coronavirus 2 by RT PCR NEGATIVE NEGATIVE Final    Comment: (NOTE) SARS-CoV-2 target nucleic acids are NOT DETECTED.  The SARS-CoV-2 RNA is generally detectable in upper respiratory specimens during the acute phase of infection. The lowest concentration of SARS-CoV-2 viral copies this assay can detect is 138 copies/mL. Stephanie Alvarado negative result does not preclude SARS-Cov-2 infection and should not be used as the sole basis for treatment or other patient management decisions. Stephanie Alvarado negative result may occur with  improper specimen collection/handling, submission of specimen other than nasopharyngeal swab, presence of viral mutation(s) within the areas targeted by this assay, and inadequate number of viral copies(<138 copies/mL). Stephanie Alvarado negative result must be combined with clinical observations, patient history, and epidemiological information. The expected result is  Negative.  Fact Sheet for Patients:  EntrepreneurPulse.com.au  Fact Sheet for Healthcare Providers:  IncredibleEmployment.be  This test is no t yet approved or cleared by the [place] FDA and  has been authorized for detection and/or diagnosis of SARS-CoV-2 by FDA under an Emergency Use Authorization (EUA). This EUA will remain  in effect (meaning this test can be used) for the duration of the COVID-19 declaration under Section 564(b)(1) of the Act, 21 U.S.C.section 360bbb-3(b)(1), unless the authorization is terminated  or revoked sooner.       Influenza Deshanti Adcox by PCR NEGATIVE NEGATIVE Final   Influenza B by PCR NEGATIVE NEGATIVE Final    Comment: (NOTE) The Xpert Xpress SARS-CoV-2/FLU/RSV plus assay is intended as an aid in the diagnosis of influenza from Nasopharyngeal swab specimens and  should not be used as [place]a sole basis for treatment. Nasal washings and aspirates are unacceptable for Xpert Xpress SARS-CoV-2/FLU/RSV testing.  Fact Sheet for Patients: EntrepreneurPulse.com.au  Fact Sheet for Healthcare Providers: IncredibleEmployment.be  This test is not yet approved or cleared by the [place] FDA and has been authorized for detection and/or diagnosis of SARS-CoV-2 by FDA under an Emergency Use Authorization (EUA). This EUA will remain in effect (meaning this test can be used) for the duration of the COVID-19 declaration under Section 564(b)(1) of the Act, 21 U.S.C. section 360bbb-3(b)(1), unless the authorization is terminated or revoked.  Performed at [facility], [place] [address], [place], [place] [address]          Radiology Studies: US RENAL  Result Date: 07/08/2021 CLINICAL DATA:  Acute kidney injury. EXAM: RENAL / URINARY TRACT ULTRASOUND COMPLETE COMPARISON:  Abdomen and pelvis CT dated 09/15/2017 FINDINGS: Right Kidney: Renal measurements: 8.3 x 4.6 x 4.6 cm =  volume: 93 mL. Normal echotexture. No hydronephrosis. Left Kidney: Renal measurements: 9.8 x 4.6 x 4.3 cm = volume: 100 mL. Normal echotexture. 1.5 cm partially exophytic simple appearing cyst. No hydronephrosis. Bladder: Appears normal for degree of bladder distention. Other: None. IMPRESSION: No significant abnormality. Electronically Signed   By: Claudie Revering M.D.   On: 07/08/2021 14:30   DG CHEST PORT 1 VIEW  Result Date: 07/08/2021 CLINICAL DATA:  Shortness of breath EXAM: PORTABLE CHEST 1 VIEW COMPARISON:  Chest radiograph done on 11/14/2013, CT done on 05/31/2016 FINDINGS: Cardiac size is within normal limits. There is poor inspiration. Central pulmonary vessels are prominent without signs of pulmonary edema. There is no focal pulmonary consolidation. There are small linear densities in the right paratracheal region and right lower lung fields. Surgical clips are seen in the left paratracheal region. IMPRESSION: Central pulmonary vessels are prominent without signs of alveolar pulmonary edema. There is no focal pulmonary consolidation. Small linear densities in the right mid and right lower lung fields may suggest subsegmental atelectasis. Electronically Signed   By: Elmer Picker M.D.   On: 07/08/2021 21:00        Scheduled Meds:  amLODipine  5 mg Oral q morning   buPROPion  300 mg Oral Daily   feeding supplement  1 Container Oral TID BM   gabapentin  600 mg Oral TID   isosorbide mononitrate  30 mg Oral QHS   metoprolol tartrate  50 mg Oral BID   pantoprazole  40 mg Oral BID   rosuvastatin  10 mg Oral QHS   Continuous Infusions:  piperacillin-tazobactam (ZOSYN)  IV 3.375 g (07/09/21 1400)     LOS: 2 days    Time spent: over 30 min    Fayrene Helper, MD [facility]   To contact the attending provider between 7A-7P or the covering provider during after hours 7P-7A, please log into the web site [email or web address] and access using universal [place] password for  that web site. If you do not have the password, please call the hospital operator.  07/09/2021, 2:56 PM

## 2021-07-10 DIAGNOSIS — R400 Somnolence: Secondary | ICD-10-CM

## 2021-07-10 LAB — CBC WITH DIFFERENTIAL/PLATELET
Abs Immature Granulocytes: 0.14 10*3/uL — ABNORMAL HIGH (ref 0.00–0.07)
Basophils Absolute: 0.1 10*3/uL (ref 0.0–0.1)
Basophils Relative: 1 %
Eosinophils Absolute: 0.2 10*3/uL (ref 0.0–0.5)
Eosinophils Relative: 2 %
HCT: 40.7 % (ref 36.0–46.0)
Hemoglobin: 12.5 g/dL (ref 12.0–15.0)
Immature Granulocytes: 2 %
Lymphocytes Relative: 13 %
Lymphs Abs: 1.2 10*3/uL (ref 0.7–4.0)
MCH: 27.9 pg (ref 26.0–34.0)
MCHC: 30.7 g/dL (ref 30.0–36.0)
MCV: 90.8 fL (ref 80.0–100.0)
Monocytes Absolute: 1.4 10*3/uL — ABNORMAL HIGH (ref 0.1–1.0)
Monocytes Relative: 14 %
Neutro Abs: 6.6 10*3/uL (ref 1.7–7.7)
Neutrophils Relative %: 68 %
Platelets: 98 10*3/uL — ABNORMAL LOW (ref 150–400)
RBC: 4.48 MIL/uL (ref 3.87–5.11)
RDW: 18 % — ABNORMAL HIGH (ref 11.5–15.5)
WBC: 9.5 10*3/uL (ref 4.0–10.5)
nRBC: 0 % (ref 0.0–0.2)

## 2021-07-10 LAB — COMPREHENSIVE METABOLIC PANEL
ALT: 12 U/L (ref 0–44)
AST: 17 U/L (ref 15–41)
Albumin: 3 g/dL — ABNORMAL LOW (ref 3.5–5.0)
Alkaline Phosphatase: 52 U/L (ref 38–126)
Anion gap: 5 (ref 5–15)
BUN: 12 mg/dL (ref 8–23)
CO2: 29 mmol/L (ref 22–32)
Calcium: 9.2 mg/dL (ref 8.9–10.3)
Chloride: 100 mmol/L (ref 98–111)
Creatinine, Ser: 0.87 mg/dL (ref 0.44–1.00)
GFR, Estimated: >60 mL/min (ref >60)
Glucose, Bld: 105 mg/dL — ABNORMAL HIGH (ref 70–99)
Potassium: 4.3 mmol/L (ref 3.5–5.1)
Sodium: 134 mmol/L — ABNORMAL LOW (ref 135–145)
Total Bilirubin: 1 mg/dL (ref 0.3–1.2)
Total Protein: 5.9 g/dL — ABNORMAL LOW (ref 6.5–8.1)

## 2021-07-10 LAB — GLUCOSE, CAPILLARY
Glucose-Capillary: 103 mg/dL — ABNORMAL HIGH (ref 70–99)
Glucose-Capillary: 118 mg/dL — ABNORMAL HIGH (ref 70–99)
Glucose-Capillary: 120 mg/dL — ABNORMAL HIGH (ref 70–99)

## 2021-07-10 LAB — UREA NITROGEN, URINE: Urea Nitrogen, Ur: 265 mg/dL

## 2021-07-10 LAB — BLOOD GAS, VENOUS
Acid-Base Excess: 3.8 mmol/L — ABNORMAL HIGH (ref 0.0–2.0)
Bicarbonate: 30.7 mmol/L — ABNORMAL HIGH (ref 20.0–28.0)
O2 Saturation: 74.5 %
Patient temperature: 98.6
pCO2, Ven: 60.7 mmHg — ABNORMAL HIGH (ref 44.0–60.0)
pH, Ven: 7.325 (ref 7.250–7.430)
pO2, Ven: 43.9 mmHg (ref 32.0–45.0)

## 2021-07-10 LAB — MAGNESIUM: Magnesium: 2 mg/dL (ref 1.7–2.4)

## 2021-07-10 LAB — PHOSPHORUS: Phosphorus: 2.7 mg/dL (ref 2.5–4.6)

## 2021-07-10 MED ORDER — LIP MEDEX EX OINT: TOPICAL_OINTMENT | CUTANEOUS | Status: DC | PRN

## 2021-07-10 NOTE — Assessment & Plan Note (Signed)
VBG with respiratory acidosis, but compensated with normal pH  Will order nightly CPAP (she uses O2 at night for OSA)

## 2021-07-10 NOTE — Progress Notes (Signed)
Subjective/Chief Complaint: Still with some abdominal pain , but improved Tolerating full liquids   Objective: Vital signs in last 24 hours: Temp:  [97.4 F (36.3 C)-97.9 F (36.6 C)] 97.4 F (36.3 C) (01/28 0500) Pulse Rate:  [71-84] 75 (01/28 0927) Resp:  [16-18] 16 (01/28 0500) BP: (100-138)/(55-88) 134/60 (01/28 0927) SpO2:  [67 %-98 %] 67 % (01/28 0500) Last BM Date: 07/06/21  Intake/Output from previous day: 01/27 0701 - 01/28 0700 In: 1958.6 [P.O.:1088; I.V.:808.2; IV Piggyback:62.5] Out: 1400 [Urine:1400] Intake/Output this shift: No intake/output data recorded.  Exam: Awake and alert Abdomen soft, minimally tender  Lab Results:  Recent Labs    07/09/21 0509 07/10/21 0542  WBC 7.5 9.5  HGB 13.4 12.5  HCT 44.4 40.7  PLT 126* 98*   BMET Recent Labs    07/09/21 0509 07/10/21 0542  NA 136 134*  K 4.8 4.3  CL 101 100  CO2 30 29  GLUCOSE 98 105*  BUN 15 12  CREATININE 1.15* 0.87  CALCIUM 9.6 9.2   PT/INR No results for input(s): LABPROT, INR in the last 72 hours. ABG No results for input(s): PHART, HCO3 in the last 72 hours.  Invalid input(s): PCO2, PO2  Studies/Results: US RENAL  Result Date: 07/08/2021 CLINICAL DATA:  Acute kidney injury. EXAM: RENAL / URINARY TRACT ULTRASOUND COMPLETE COMPARISON:  Abdomen and pelvis CT dated 09/15/2017 FINDINGS: Right Kidney: Renal measurements: 8.3 x 4.6 x 4.6 cm = volume: 93 mL. Normal echotexture. No hydronephrosis. Left Kidney: Renal measurements: 9.8 x 4.6 x 4.3 cm = volume: 100 mL. Normal echotexture. 1.5 cm partially exophytic simple appearing cyst. No hydronephrosis. Bladder: Appears normal for degree of bladder distention. Other: None. IMPRESSION: No significant abnormality. Electronically Signed   By: Claudie Revering M.D.   On: 07/08/2021 14:30   DG CHEST PORT 1 VIEW  Result Date: 07/08/2021 CLINICAL DATA:  Shortness of breath EXAM: PORTABLE CHEST 1 VIEW COMPARISON:  Chest radiograph done on  11/14/2013, CT done on 05/31/2016 FINDINGS: Cardiac size is within normal limits. There is poor inspiration. Central pulmonary vessels are prominent without signs of pulmonary edema. There is no focal pulmonary consolidation. There are small linear densities in the right paratracheal region and right lower lung fields. Surgical clips are seen in the left paratracheal region. IMPRESSION: Central pulmonary vessels are prominent without signs of alveolar pulmonary edema. There is no focal pulmonary consolidation. Small linear densities in the right mid and right lower lung fields may suggest subsegmental atelectasis. Electronically Signed   By: Elmer Picker M.D.   On: 07/08/2021 21:00    Anti-infectives: Anti-infectives (From admission, onward)    Start     Dose/Rate Route Frequency Ordered Stop   07/07/21 1200  piperacillin-tazobactam (ZOSYN) IVPB 3.375 g        3.375 g 12.5 mL/hr over 240 Minutes Intravenous Every 8 hours 07/07/21 0439     07/07/21 0415  piperacillin-tazobactam (ZOSYN) IVPB 3.375 g        3.375 g 100 mL/hr over 30 Minutes Intravenous  Once 07/07/21 0413 07/07/21 0505   07/07/21 0000  amoxicillin-clavulanate (AUGMENTIN) 875-125 MG per tablet 1 tablet        1 tablet Oral  Once 07/06/21 2353 07/07/21 0003       Assessment/Plan: Small bowel diverticulitis - CT scan 1/24 showed diverticulitis of the small bowel in the mid abdomen with no diverticular abscess or perforation - Less RLQ tenderness on exam. WBC WNL, afebrile. advance to full liquids.  continue  IV antibiotics today. . Mobilize.   Strightforward medical decision making  Coralie Keens MD 07/10/2021

## 2021-07-10 NOTE — Progress Notes (Signed)
PT Cancellation Note  Patient Details Name: Stephanie Alvarado MRN: [id number] DOB: [birth year]   Cancelled Treatment:     PT eval attempted this am but deferred with pt stating she was just too tired and woozy from the morphine and could not possibly attempt to mobilize at this time.  Will follow as schedule allows.   Mathilda Maguire 07/10/2021, 4:13 PM

## 2021-07-10 NOTE — Progress Notes (Signed)
PROGRESS NOTE    Stephanie Alvarado  ZOX:[id number] DOB: 01-20-1941 DOA: 07/06/2021 PCP: Caryl Bis, MD  Chief Complaint  Patient presents with   Abdominal Pain    Brief Narrative:  Stephanie Alvarado is Stephanie Alvarado 81 y.o. female with history of CAD status post stenting, chronic diastolic CHF, hypertension, sleep apnea uses oxygen at home presents to the ER after patient started having severe abdominal pain involving the mid abdomen and right lower quadrant since 2 PM yesterday.  Pain is constant had some episodes of vomiting no blood in the vomitus denies any diarrhea fever or chills.  She's been found to have small bowel diverticulitis.    Assessment & Plan:   Principal Problem:   Diverticulitis Active Problems:   CAD (coronary artery disease)   AKI (acute kidney injury) ([place])   Essential hypertension   Chronic respiratory failure with hypoxia, on home oxygen therapy (HCC)   Daytime sleepiness   Chronic diastolic CHF (congestive heart failure) (HCC)   Hypokalemia   Thrombocytopenia (HCC)   * Diverticulitis- (present on admission) CT with small bowel diverticulitis Continue abx Appreciate surgery assistance Advance to fulls per surgery mobilize   AKI (acute kidney injury) ([place]) AKI is improving with IVF Follow UA (bland) and urine studies Possibly contrast related? [place] without hydro Continue IVF for now  CAD (coronary artery disease)- (present on admission) No CP, follow  Daytime sleepiness VBG with respiratory acidosis, but compensated with normal pH  Will order nightly CPAP (she uses O2 at night for OSA)  Chronic respiratory failure with hypoxia, on home oxygen therapy (HCC) On oxygen at night for sleep apnea  Essential hypertension- (present on admission) Amlodipine, imdur, metoprolol  Hypokalemia follow  Chronic diastolic CHF (congestive heart failure) ([place]) Appears euvolemic, follow volume status closely  Thrombocytopenia (HCC) Chronic, follow  Trending down  slightly, monitor   DVT prophylaxis: SCD Code Status: full Family Communication: none Disposition:   Status is: Inpatient  Remains inpatient appropriate because: need for IV abx, surgery c/s       Consultants:  surgery  Procedures:  none  Antimicrobials:  Anti-infectives (From admission, onward)    Start     Dose/Rate Route Frequency Ordered Stop   07/07/21 1200  piperacillin-tazobactam (ZOSYN) IVPB 3.375 g        3.375 g 12.5 mL/hr over 240 Minutes Intravenous Every 8 hours 07/07/21 0439     07/07/21 0415  piperacillin-tazobactam (ZOSYN) IVPB 3.375 g        3.375 g 100 mL/hr over 30 Minutes Intravenous  Once 07/07/21 0413 07/07/21 0505   07/07/21 0000  amoxicillin-clavulanate (AUGMENTIN) 875-125 MG per tablet 1 tablet        1 tablet Oral  Once 07/06/21 2353 07/07/21 0003       Subjective: C/o sleepiness  Objective: Vitals:   07/09/21 2150 07/10/21 0500 07/10/21 0927 07/10/21 1324  BP: (!) 111/55 138/61 134/60 129/61  Pulse: 74 84 75 72  Resp: 18 16  17   Temp: 97.9 F (36.6 C) (!) 97.4 F (36.3 C)  98.2 F (36.8 C)  TempSrc: Oral Oral  Oral  SpO2: 96% (!) 67%  97%  Weight:      Height:        Intake/Output Summary (Last 24 hours) at 07/10/2021 1454 Last data filed at 07/10/2021 0600 Gross per 24 hour  Intake 1130.64 ml  Output 1200 ml  Net -69.36 ml   Filed Weights   07/06/21 2142  Weight: 90.8 kg  Examination:  General: No acute distress. Cardiovascular: RRR Lungs: unlabored Abdomen: Soft, nontender, nondistended  Neurological: Alert and oriented 3. Moves all extremities 4 . Cranial nerves II through XII grossly intact. Skin: Warm and dry. No rashes or lesions. Extremities: No clubbing or cyanosis. No edema.    Data Reviewed: I have personally reviewed following labs and imaging studies  CBC: Recent Labs  Lab 07/06/21 2139 07/07/21 0430 07/08/21 0433 07/09/21 0509 07/10/21 0542  WBC 7.6 7.0 6.2 7.5 9.5  NEUTROABS  --  4.9  3.8 4.5 6.6  HGB 13.0 13.7 13.6 13.4 12.5  HCT 42.3 43.5 44.0 44.4 40.7  MCV 90.4 90.1 89.6 91.9 90.8  PLT 130* 125* 139* 126* 98*    Basic Metabolic Panel: Recent Labs  Lab 07/07/21 0430 07/08/21 0433 07/08/21 0854 07/09/21 0509 07/10/21 0542  NA 136 134* 135 136 134*  K 3.2* 5.2* 5.1 4.8 4.3  CL 103 101 103 101 100  CO2 28 26 27 30 29   GLUCOSE 108* 95 91 98 105*  BUN 10 10 14 15 12   CREATININE 0.63 1.28* 1.43* 1.15* 0.87  CALCIUM 8.6* 9.3 9.4 9.6 9.2  MG  --  2.2  --  2.3 2.0  PHOS  --  3.8  --  3.5 2.7    GFR: Estimated Creatinine Clearance: 58.8 mL/min (by C-G formula based on SCr of 0.87 mg/dL).  Liver Function Tests: Recent Labs  Lab 07/06/21 2139 07/07/21 0430 07/08/21 0433 07/09/21 0509 07/10/21 0542  AST 17 17 20 16 17   ALT 14 12 14 15 12   ALKPHOS 75 65 58 56 52  BILITOT 0.6 0.5 0.8 0.8 1.0  PROT 7.8 6.6 6.4* 6.4* 5.9*  ALBUMIN 4.1 3.5 3.3* 3.3* 3.0*    CBG: Recent Labs  Lab 07/08/21 2356 07/09/21 0720 07/09/21 1554 07/09/21 2348 07/10/21 0805  GLUCAP 86 157* 105* 106* 120*     Recent Results (from the past 240 hour(s))  Resp Panel by RT-PCR (Flu Bastien Strawser&B, Covid) Nasopharyngeal Swab     Status: None   Collection Time: 07/07/21  2:10 AM   Specimen: Nasopharyngeal Swab; Nasopharyngeal(NP) swabs in vial transport medium  Result Value Ref Range Status   SARS Coronavirus 2 by RT PCR NEGATIVE NEGATIVE Final    Comment: (NOTE) SARS-CoV-2 target nucleic acids are NOT DETECTED.  The SARS-CoV-2 RNA is generally detectable in upper respiratory specimens during the acute phase of infection. The lowest concentration of SARS-CoV-2 viral copies this assay can detect is 138 copies/mL. Arik Husmann negative result does not preclude SARS-Cov-2 infection and should not be used as the sole basis for treatment or other patient management decisions. Ferrin Liebig negative result may occur with  improper specimen collection/handling, submission of specimen other than nasopharyngeal  swab, presence of viral mutation(s) within the areas targeted by this assay, and inadequate number of viral copies(<138 copies/mL). Tamer Baughman negative result must be combined with clinical observations, patient history, and epidemiological information. The expected result is Negative.  Fact Sheet for Patients:  EntrepreneurPulse.com.au  Fact Sheet for Healthcare Providers:  IncredibleEmployment.be  This test is no t yet approved or cleared by the [place] FDA and  has been authorized for detection and/or diagnosis of SARS-CoV-2 by FDA under an Emergency Use Authorization (EUA). This EUA will remain  in effect (meaning this test can be used) for the duration of the COVID-19 declaration under Section 564(b)(1) of the Act, 21 U.S.C.section 360bbb-3(b)(1), unless the authorization is terminated  or revoked sooner.  Influenza Avrey Hyser by PCR NEGATIVE NEGATIVE Final   Influenza B by PCR NEGATIVE NEGATIVE Final    Comment: (NOTE) The Xpert Xpress SARS-CoV-2/FLU/RSV plus assay is intended as an aid in the diagnosis of influenza from Nasopharyngeal swab specimens and should not be used as Solmon Bohr sole basis for treatment. Nasal washings and aspirates are unacceptable for Xpert Xpress SARS-CoV-2/FLU/RSV testing.  Fact Sheet for Patients: EntrepreneurPulse.com.au  Fact Sheet for Healthcare Providers: IncredibleEmployment.be  This test is not yet approved or cleared by the [place] FDA and has been authorized for detection and/or diagnosis of SARS-CoV-2 by FDA under an Emergency Use Authorization (EUA). This EUA will remain in effect (meaning this test can be used) for the duration of the COVID-19 declaration under Section 564(b)(1) of the Act, 21 U.S.C. section 360bbb-3(b)(1), unless the authorization is terminated or revoked.  Performed at [facility], [place] [address] [place], [place] [address]           Radiology Studies: DG CHEST PORT 1 VIEW  Result Date: 07/08/2021 CLINICAL DATA:  Shortness of breath EXAM: PORTABLE CHEST 1 VIEW COMPARISON:  Chest radiograph done on 11/14/2013, CT done on 05/31/2016 FINDINGS: Cardiac size is within normal limits. There is poor inspiration. Central pulmonary vessels are prominent without signs of pulmonary edema. There is no focal pulmonary consolidation. There are small linear densities in the right paratracheal region and right lower lung fields. Surgical clips are seen in the left paratracheal region. IMPRESSION: Central pulmonary vessels are prominent without signs of alveolar pulmonary edema. There is no focal pulmonary consolidation. Small linear densities in the right mid and right lower lung fields may suggest subsegmental atelectasis. Electronically Signed   By: Elmer Picker M.D.   On: 07/08/2021 21:00        Scheduled Meds:  amLODipine  5 mg Oral q morning   buPROPion  300 mg Oral Daily   feeding supplement  1 Container Oral TID BM   gabapentin  600 mg Oral TID   isosorbide mononitrate  30 mg Oral QHS   metoprolol tartrate  50 mg Oral BID   pantoprazole  40 mg Oral BID   rosuvastatin  10 mg Oral QHS   Continuous Infusions:  piperacillin-tazobactam (ZOSYN)  IV 3.375 g (07/10/21 0337)     LOS: 3 days    Time spent: over 56 min    Fayrene Helper, MD [facility]   To contact the attending provider between 7A-7P or the covering provider during after hours 7P-7A, please log into the web site [email or web address] and access using universal [place] password for that web site. If you do not have the password, please call the hospital operator.  07/10/2021, 2:54 PM

## 2021-07-10 NOTE — Plan of Care (Signed)

## 2021-07-11 ENCOUNTER — Inpatient Hospital Stay (HOSPITAL_COMMUNITY): Payer: PPO

## 2021-07-11 DIAGNOSIS — R079 Chest pain, unspecified: Secondary | ICD-10-CM

## 2021-07-11 LAB — CBC WITH DIFFERENTIAL/PLATELET
Abs Immature Granulocytes: 0.09 10*3/uL — ABNORMAL HIGH (ref 0.00–0.07)
Basophils Absolute: 0.1 10*3/uL (ref 0.0–0.1)
Basophils Relative: 1 %
Eosinophils Absolute: 0.2 10*3/uL (ref 0.0–0.5)
Eosinophils Relative: 3 %
HCT: 39.6 % (ref 36.0–46.0)
Hemoglobin: 12.2 g/dL (ref 12.0–15.0)
Immature Granulocytes: 1 %
Lymphocytes Relative: 14 %
Lymphs Abs: 1.3 10*3/uL (ref 0.7–4.0)
MCH: 27.6 pg (ref 26.0–34.0)
MCHC: 30.8 g/dL (ref 30.0–36.0)
MCV: 89.6 fL (ref 80.0–100.0)
Monocytes Absolute: 1.4 10*3/uL — ABNORMAL HIGH (ref 0.1–1.0)
Monocytes Relative: 15 %
Neutro Abs: 6.5 10*3/uL (ref 1.7–7.7)
Neutrophils Relative %: 66 %
Platelets: 95 10*3/uL — ABNORMAL LOW (ref 150–400)
RBC: 4.42 MIL/uL (ref 3.87–5.11)
RDW: 18.1 % — ABNORMAL HIGH (ref 11.5–15.5)
WBC: 9.7 10*3/uL (ref 4.0–10.5)
nRBC: 0 % (ref 0.0–0.2)

## 2021-07-11 LAB — COMPREHENSIVE METABOLIC PANEL
ALT: 11 U/L (ref 0–44)
AST: 13 U/L — ABNORMAL LOW (ref 15–41)
Albumin: 2.9 g/dL — ABNORMAL LOW (ref 3.5–5.0)
Alkaline Phosphatase: 51 U/L (ref 38–126)
Anion gap: 5 (ref 5–15)
BUN: 15 mg/dL (ref 8–23)
CO2: 30 mmol/L (ref 22–32)
Calcium: 9.5 mg/dL (ref 8.9–10.3)
Chloride: 97 mmol/L — ABNORMAL LOW (ref 98–111)
Creatinine, Ser: 0.93 mg/dL (ref 0.44–1.00)
GFR, Estimated: >60 mL/min (ref >60)
Glucose, Bld: 109 mg/dL — ABNORMAL HIGH (ref 70–99)
Potassium: 3.9 mmol/L (ref 3.5–5.1)
Sodium: 132 mmol/L — ABNORMAL LOW (ref 135–145)
Total Bilirubin: 0.9 mg/dL (ref 0.3–1.2)
Total Protein: 6.1 g/dL — ABNORMAL LOW (ref 6.5–8.1)

## 2021-07-11 LAB — PHOSPHORUS: Phosphorus: 3 mg/dL (ref 2.5–4.6)

## 2021-07-11 LAB — MAGNESIUM: Magnesium: 2.1 mg/dL (ref 1.7–2.4)

## 2021-07-11 LAB — GLUCOSE, CAPILLARY
Glucose-Capillary: 127 mg/dL — ABNORMAL HIGH (ref 70–99)
Glucose-Capillary: 142 mg/dL — ABNORMAL HIGH (ref 70–99)
Glucose-Capillary: 99 mg/dL (ref 70–99)

## 2021-07-11 MED ORDER — ACETAMINOPHEN 325 MG PO TABS
650.0000 mg | ORAL_TABLET | Freq: Four times a day (QID) | ORAL | Status: DC | PRN
  Filled 2021-07-11: qty 2

## 2021-07-11 MED ORDER — DICLOFENAC SODIUM 1 % EX GEL
2.0000 g | Freq: Four times a day (QID) | CUTANEOUS | Status: DC
  Administered 2021-07-12 – 2021-07-19 (×28): 2 g via TOPICAL
  Filled 2021-07-11: qty 100

## 2021-07-11 MED ORDER — OXYCODONE HCL 5 MG PO TABS
2.5000 mg | ORAL_TABLET | ORAL | Status: DC | PRN
Start: 2021-07-11 — End: 2021-07-19
  Administered 2021-07-11 – 2021-07-17 (×2): 2.5 mg via ORAL
  Filled 2021-07-11 (×2): qty 1

## 2021-07-11 NOTE — Progress Notes (Signed)
PROGRESS NOTE    Stephanie Alvarado  MPN:[id number] DOB: 01-24-1941 DOA: 07/06/2021 PCP: Caryl Bis, MD  Chief Complaint  Patient presents with   Abdominal Pain    Brief Narrative:  Stephanie Alvarado is Stephanie Alvarado 81 y.o. female with history of CAD status post stenting, chronic diastolic CHF, hypertension, sleep apnea uses oxygen at home presents to the ER after patient started having severe abdominal pain involving the mid abdomen and right lower quadrant since 2 PM yesterday.  Pain is constant had some episodes of vomiting no blood in the vomitus denies any diarrhea fever or chills.  She's been found to have small bowel diverticulitis.    Assessment & Plan:   Principal Problem:   Diverticulitis Active Problems:   CAD (coronary artery disease)   AKI (acute kidney injury) ([place])   Essential hypertension   Chronic respiratory failure with hypoxia, on home oxygen therapy (HCC)   Daytime sleepiness   Chronic diastolic CHF (congestive heart failure) (HCC)   Hypokalemia   Thrombocytopenia (HCC)   Right-sided chest pain   * Diverticulitis- (present on admission) CT with small bowel diverticulitis Continue abx Appreciate surgery assistance Advance to soft diet per surgery mobilize   AKI (acute kidney injury) ([place]) AKI is improving with IVF Follow UA (bland) and urine studies Possibly contrast related? [place] without hydro Continue IVF for now Improved  CAD (coronary artery disease)- (present on admission) No sx ACS  Daytime sleepiness VBG with respiratory acidosis, but compensated with normal pH  Will order nightly CPAP (she uses O2 at night for OSA)  Chronic respiratory failure with hypoxia, on home oxygen therapy (HCC) On oxygen at night for sleep apnea  Essential hypertension- (present on admission) Amlodipine, imdur, metoprolol  Hypokalemia follow  Chronic diastolic CHF (congestive heart failure) (HCC) Appears euvolemic, follow volume status closely  Thrombocytopenia  (HCC) Chronic, follow  Trending down slightly, monitor  Right-sided chest pain Musculoskeletal pain - tender to palpation Not c/w ACS Will add voltaren  2 view CXR   DVT prophylaxis: SCD Code Status: full Family Communication: none Disposition:   Status is: Inpatient  Remains inpatient appropriate because: need for IV abx, surgery c/s       Consultants:  surgery  Procedures:  none  Antimicrobials:  Anti-infectives (From admission, onward)    Start     Dose/Rate Route Frequency Ordered Stop   07/07/21 1200  piperacillin-tazobactam (ZOSYN) IVPB 3.375 g        3.375 g 12.5 mL/hr over 240 Minutes Intravenous Every 8 hours 07/07/21 0439     07/07/21 0415  piperacillin-tazobactam (ZOSYN) IVPB 3.375 g        3.375 g 100 mL/hr over 30 Minutes Intravenous  Once 07/07/21 0413 07/07/21 0505   07/07/21 0000  amoxicillin-clavulanate (AUGMENTIN) 875-125 MG per tablet 1 tablet        1 tablet Oral  Once 07/06/21 2353 07/07/21 0003       Subjective: C/o TTP under right breast, present for  Jon week or so  No injury that she can remember  Objective: Vitals:   07/10/21 2051 07/11/21 0045 07/11/21 0324 07/11/21 1453  BP: 130/63  109/70 131/60  Pulse: 87  70 88  Resp: 16 (!) 21 16 17   Temp: 98.4 F (36.9 C)  98 F (36.7 C) (!) 97.5 F (36.4 C)  TempSrc: Oral   Oral  SpO2: 91%  98% (!) 89%  Weight:      Height:        Intake/Output  Summary (Last 24 hours) at 07/11/2021 1544 Last data filed at 07/11/2021 0900 Gross per 24 hour  Intake 960 ml  Output 700 ml  Net 260 ml   Filed Weights   07/06/21 2142  Weight: 90.8 kg    Examination:  General: No acute distress. Cardiovascular: RRR - TTP under right breast Lungs: unlabored Abdomen: Soft, nontender, nondistended  Neurological: Alert and oriented 3. Moves all extremities 4. Cranial nerves II through XII grossly intact. Skin: Warm and dry. No rashes or lesions. Extremities: No clubbing or cyanosis. No  edema.   Data Reviewed: I have personally reviewed following labs and imaging studies  CBC: Recent Labs  Lab 07/07/21 0430 07/08/21 0433 07/09/21 0509 07/10/21 0542 07/11/21 0518  WBC 7.0 6.2 7.5 9.5 9.7  NEUTROABS 4.9 3.8 4.5 6.6 6.5  HGB 13.7 13.6 13.4 12.5 12.2  HCT 43.5 44.0 44.4 40.7 39.6  MCV 90.1 89.6 91.9 90.8 89.6  PLT 125* 139* 126* 98* 95*    Basic Metabolic Panel: Recent Labs  Lab 07/08/21 0433 07/08/21 0854 07/09/21 0509 07/10/21 0542 07/11/21 0518  NA 134* 135 136 134* 132*  K 5.2* 5.1 4.8 4.3 3.9  CL 101 103 101 100 97*  CO2 26 27 30 29 30   GLUCOSE 95 91 98 105* 109*  BUN 10 14 15 12 15   CREATININE 1.28* 1.43* 1.15* 0.87 0.93  CALCIUM 9.3 9.4 9.6 9.2 9.5  MG 2.2  --  2.3 2.0 2.1  PHOS 3.8  --  3.5 2.7 3.0    GFR: Estimated Creatinine Clearance: 55 mL/min (by C-G formula based on SCr of 0.93 mg/dL).  Liver Function Tests: Recent Labs  Lab 07/07/21 0430 07/08/21 0433 07/09/21 0509 07/10/21 0542 07/11/21 0518  AST 17 20 16 17  13*  ALT 12 14 15 12 11   ALKPHOS 65 58 56 52 51  BILITOT 0.5 0.8 0.8 1.0 0.9  PROT 6.6 6.4* 6.4* 5.9* 6.1*  ALBUMIN 3.5 3.3* 3.3* 3.0* 2.9*    CBG: Recent Labs  Lab 07/09/21 2348 07/10/21 0805 07/10/21 1640 07/10/21 2334 07/11/21 0729  GLUCAP 106* 120* 103* 118* 99     Recent Results (from the past 240 hour(s))  Resp Panel by RT-PCR (Flu Janssen Zee&B, Covid) Nasopharyngeal Swab     Status: None   Collection Time: 07/07/21  2:10 AM   Specimen: Nasopharyngeal Swab; Nasopharyngeal(NP) swabs in vial transport medium  Result Value Ref Range Status   SARS Coronavirus 2 by RT PCR NEGATIVE NEGATIVE Final    Comment: (NOTE) SARS-CoV-2 target nucleic acids are NOT DETECTED.  The SARS-CoV-2 RNA is generally detectable in upper respiratory specimens during the acute phase of infection. The lowest concentration of SARS-CoV-2 viral copies this assay can detect is 138 copies/mL. Keino Placencia negative result does not preclude  SARS-Cov-2 infection and should not be used as the sole basis for treatment or other patient management decisions. Macil Crady negative result may occur with  improper specimen collection/handling, submission of specimen other than nasopharyngeal swab, presence of viral mutation(s) within the areas targeted by this assay, and inadequate number of viral copies(<138 copies/mL). Cowan Pilar negative result must be combined with clinical observations, patient history, and epidemiological information. The expected result is Negative.  Fact Sheet for Patients:  EntrepreneurPulse.com.au  Fact Sheet for Healthcare Providers:  IncredibleEmployment.be  This test is no t yet approved or cleared by the [place] FDA and  has been authorized for detection and/or diagnosis of SARS-CoV-2 by FDA under an Emergency Use Authorization (EUA). This EUA  will remain  in effect (meaning this test can be used) for the duration of the COVID-19 declaration under Section 564(b)(1) of the Act, 21 U.S.C.section 360bbb-3(b)(1), unless the authorization is terminated  or revoked sooner.       Influenza Latoia Eyster by PCR NEGATIVE NEGATIVE Final   Influenza B by PCR NEGATIVE NEGATIVE Final    Comment: (NOTE) The Xpert Xpress SARS-CoV-2/FLU/RSV plus assay is intended as an aid in the diagnosis of influenza from Nasopharyngeal swab specimens and should not be used as Norman Bier sole basis for treatment. Nasal washings and aspirates are unacceptable for Xpert Xpress SARS-CoV-2/FLU/RSV testing.  Fact Sheet for Patients: EntrepreneurPulse.com.au  Fact Sheet for Healthcare Providers: IncredibleEmployment.be  This test is not yet approved or cleared by the [place] FDA and has been authorized for detection and/or diagnosis of SARS-CoV-2 by FDA under an Emergency Use Authorization (EUA). This EUA will remain in effect (meaning this test can be used) for the duration of  the COVID-19 declaration under Section 564(b)(1) of the Act, 21 U.S.C. section 360bbb-3(b)(1), unless the authorization is terminated or revoked.  Performed at [facility], [place] [address], [place], [place] [address]          Radiology Studies: No results found.      Scheduled Meds:  amLODipine  5 mg Oral q morning   buPROPion  300 mg Oral Daily   diclofenac Sodium  2 g Topical QID   feeding supplement  1 Container Oral TID BM   gabapentin  600 mg Oral TID   isosorbide mononitrate  30 mg Oral QHS   metoprolol tartrate  50 mg Oral BID   pantoprazole  40 mg Oral BID   rosuvastatin  10 mg Oral QHS   Continuous Infusions:  piperacillin-tazobactam (ZOSYN)  IV 3.375 g (07/11/21 1445)     LOS: 4 days    Time spent: over 18 min    Fayrene Helper, MD [facility]   To contact the attending provider between 7A-7P or the covering provider during after hours 7P-7A, please log into the web site [email or web address] and access using universal [place] password for that web site. If you do not have the password, please call the hospital operator.  07/11/2021, 3:44 PM

## 2021-07-11 NOTE — Assessment & Plan Note (Addendum)
Musculoskeletal pain - tender to palpation Not c/w ACS Will add voltaren  2 view CXR without active disease ? Related to PE - localized airspace opacity in RLL - but she's had chest pain for Stephanie Alvarado few weeks and it's TTP which doesn't really fit

## 2021-07-11 NOTE — Progress Notes (Signed)
° °  Subjective/Chief Complaint: She tolerated full liquids and wants to try a soft diet Had multiple liquid bowel movements last night Still with some pain but improving   Objective: Vital signs in last 24 hours: Temp:  [98 F (36.7 C)-98.4 F (36.9 C)] 98 F (36.7 C) (01/29 0324) Pulse Rate:  [70-87] 70 (01/29 0324) Resp:  [16-21] 16 (01/29 0324) BP: (109-134)/(60-70) 109/70 (01/29 0324) SpO2:  [91 %-98 %] 98 % (01/29 0324) Last BM Date: 07/10/21  Intake/Output from previous day: 01/28 0701 - 01/29 0700 In: 720 [P.O.:720] Out: 700 [Urine:700] Intake/Output this shift: No intake/output data recorded.  Exam: She is awake and alert Her abdomen is soft with very mild right lower quadrant tenderness and some guarding.  Lab Results:  Recent Labs    07/10/21 0542 07/11/21 0518  WBC 9.5 9.7  HGB 12.5 12.2  HCT 40.7 39.6  PLT 98* 95*   BMET Recent Labs    07/10/21 0542 07/11/21 0518  NA 134* 132*  K 4.3 3.9  CL 100 97*  CO2 29 30  GLUCOSE 105* 109*  BUN 12 15  CREATININE 0.87 0.93  CALCIUM 9.2 9.5   PT/INR No results for input(s): LABPROT, INR in the last 72 hours. ABG Recent Labs    07/10/21 1224  HCO3 30.7*    Studies/Results: No results found.  Anti-infectives: Anti-infectives (From admission, onward)    Start     Dose/Rate Route Frequency Ordered Stop   07/07/21 1200  piperacillin-tazobactam (ZOSYN) IVPB 3.375 g        3.375 g 12.5 mL/hr over 240 Minutes Intravenous Every 8 hours 07/07/21 0439     07/07/21 0415  piperacillin-tazobactam (ZOSYN) IVPB 3.375 g        3.375 g 100 mL/hr over 30 Minutes Intravenous  Once 07/07/21 0413 07/07/21 0505   07/07/21 0000  amoxicillin-clavulanate (AUGMENTIN) 875-125 MG per tablet 1 tablet        1 tablet Oral  Once 07/06/21 2353 07/07/21 0003       Assessment/Plan: Small bowel diverticulitis - CT scan 1/24 showed diverticulitis of the small bowel in the mid abdomen with no diverticular abscess or  perforation -Exam continues to improve.  White blood count is normal.  Will advance to a soft diet  Straightforward medical decision making   Coralie Keens MD 07/11/2021

## 2021-07-11 NOTE — Evaluation (Signed)
Physical Therapy Evaluation Patient Details Name: Stephanie Alvarado MRN: [id number] DOB: [birth year] Today's Date: 07/11/2021  History of Present Illness  Pt admitted from home 2* abdominal pain and dx with diverticulitis.  Pt with hx of CAD, carotid artery disease, MVP, neuropathy, PVD, CVA, back surgery, and bil TKR  Clinical Impression  Pt admitted as above and presenting with functional mobility limitations 2* generalized weakness, decreased endurance and balance deficits.  Pt hopes to progress to dc home with limited assist of family/friends and reports HHPT was already set up to come to her home.     Recommendations for follow up therapy are one component of a multi-disciplinary discharge planning process, led by the attending physician.  Recommendations may be updated based on patient status, additional functional criteria and insurance authorization.  Follow Up Recommendations Home health PT (Pt reports HHPT was already scheduled to start with her at home)    Assistance Recommended at Discharge Intermittent Supervision/Assistance  Patient can return home with the following  A little help with walking and/or transfers;A little help with bathing/dressing/bathroom;Assistance with cooking/housework;Assist for transportation    Equipment Recommendations None recommended by PT  Recommendations for Other Services       Functional Status Assessment       Precautions / Restrictions Precautions Precautions: Fall Restrictions Weight Bearing Restrictions: No      Mobility  Bed Mobility Overal bed mobility: Needs Assistance Bed Mobility: Supine to Sit     Supine to sit: Min assist, Mod assist     General bed mobility comments: Increased time with use of bedrail and physical assist to bring trunk to upright sitting    Transfers Overall transfer level: Needs assistance Equipment used: Rolling walker (2 wheels) Transfers: Sit to/from Stand Sit to Stand: Min assist, Mod assist            General transfer comment: cues for use of UEs and physical assist to bring wt up and fwd to stand from low bed    Ambulation/Gait Ambulation/Gait assistance: Min assist, Min guard Gait Distance (Feet): 100 Feet Assistive device: Rolling walker (2 wheels) Gait Pattern/deviations: Step-to pattern, Step-through pattern, Decreased step length - right, Decreased step length - left, Shuffle, Trunk flexed Gait velocity: decr     General Gait Details: cues for posture, positon from RW and safety awareness  Stairs            Wheelchair Mobility    Modified Rankin (Stroke Patients Only)       Balance Overall balance assessment: Needs assistance Sitting-balance support: No upper extremity supported, Feet supported Sitting balance-Leahy Scale: Good     Standing balance support: Bilateral upper extremity supported Standing balance-Leahy Scale: Poor                               Pertinent Vitals/Pain Pain Assessment Pain Assessment: 0-10 Pain Score: 3  Pain Location: abdomen Pain Intervention(s): Limited activity within patient's tolerance, Monitored during session, Premedicated before session    Home Living Family/patient expects to be discharged to:: Private residence Living Arrangements: Alone Available Help at Discharge: Family;Friend(s);Available PRN/intermittently Type of Home: House Home Access: Level entry       Home Layout: One level Home Equipment: Conservation officer, nature (2 wheels);Cane - single point;BSC/3in1      Prior Function Prior Level of Function : Independent/Modified Independent             Mobility Comments: using cane or RW as  needed       Hand Dominance   Dominant Hand: Right    Extremity/Trunk Assessment   Upper Extremity Assessment Upper Extremity Assessment: Generalized weakness    Lower Extremity Assessment Lower Extremity Assessment: Generalized weakness    Cervical / Trunk Assessment Cervical / Trunk  Assessment: Kyphotic  Communication   Communication: No difficulties  Cognition Arousal/Alertness: Awake/alert Behavior During Therapy: WFL for tasks assessed/performed Overall Cognitive Status: Within Functional Limits for tasks assessed                                          General Comments      Exercises     Assessment/Plan    PT Assessment Patient needs continued PT services  PT Problem List Decreased strength;Decreased activity tolerance;Decreased balance;Decreased mobility;Decreased knowledge of use of DME;Decreased safety awareness       PT Treatment Interventions DME instruction;Gait training;Functional mobility training;Therapeutic activities;Therapeutic exercise;Balance training;Patient/family education    PT Goals (Current goals can be found in the Care Plan section)  Acute Rehab PT Goals Patient Stated Goal: Regain IND and get something to eat besides broth PT Goal Formulation: With patient Time For Goal Achievement: 07/25/21 Potential to Achieve Goals: Good    Frequency Min 3X/week     Co-evaluation               AM-PAC PT "6 Clicks" Mobility  Outcome Measure Help needed turning from your back to your side while in a flat bed without using bedrails?: A Lot Help needed moving from lying on your back to sitting on the side of a flat bed without using bedrails?: A Lot Help needed moving to and from a bed to a chair (including a wheelchair)?: A Lot Help needed standing up from a chair using your arms (e.g., wheelchair or bedside chair)?: A Lot Help needed to walk in hospital room?: A Little Help needed climbing 3-5 steps with a railing? : A Lot 6 Click Score: 13    End of Session Equipment Utilized During Treatment: Gait belt Activity Tolerance: Patient limited by fatigue Patient left: in chair;with call bell/phone within reach;with chair alarm set Nurse Communication: Mobility status PT Visit Diagnosis: Unsteadiness on feet  (R26.81);Muscle weakness (generalized) (M62.81);Difficulty in walking, not elsewhere classified (R26.2)    Time: 8416-6063 PT Time Calculation (min) (ACUTE ONLY): 26 min   Charges:   PT Evaluation $PT Eval Low Complexity: 1 Low PT Treatments $Gait Training: 8-22 mins        Debe Coder PT Acute Rehabilitation Services Pager [phone] Office [phone]   Jaleah Lefevre 07/11/2021, 12:56 PM

## 2021-07-11 NOTE — Progress Notes (Signed)
Pt O2 dropped to 70's per NT during 2000 V/S put [place] back on to 3L for pt to be at 92%.

## 2021-07-12 ENCOUNTER — Inpatient Hospital Stay (HOSPITAL_COMMUNITY): Payer: PPO

## 2021-07-12 ENCOUNTER — Encounter (HOSPITAL_COMMUNITY): Payer: Self-pay | Admitting: Internal Medicine

## 2021-07-12 DIAGNOSIS — I2699 Other pulmonary embolism without acute cor pulmonale: Secondary | ICD-10-CM

## 2021-07-12 DIAGNOSIS — I2692 Saddle embolus of pulmonary artery without acute cor pulmonale: Secondary | ICD-10-CM

## 2021-07-12 DIAGNOSIS — J9621 Acute and chronic respiratory failure with hypoxia: Secondary | ICD-10-CM

## 2021-07-12 DIAGNOSIS — I2609 Other pulmonary embolism with acute cor pulmonale: Secondary | ICD-10-CM

## 2021-07-12 LAB — BASIC METABOLIC PANEL
Anion gap: 7 (ref 5–15)
BUN: 14 mg/dL (ref 8–23)
CO2: 28 mmol/L (ref 22–32)
Calcium: 9.2 mg/dL (ref 8.9–10.3)
Chloride: 98 mmol/L (ref 98–111)
Creatinine, Ser: 0.85 mg/dL (ref 0.44–1.00)
GFR, Estimated: >60 mL/min (ref >60)
Glucose, Bld: 103 mg/dL — ABNORMAL HIGH (ref 70–99)
Potassium: 4.1 mmol/L (ref 3.5–5.1)
Sodium: 133 mmol/L — ABNORMAL LOW (ref 135–145)

## 2021-07-12 LAB — PROTIME-INR
INR: 1.3 — ABNORMAL HIGH (ref 0.8–1.2)
Prothrombin Time: 16 seconds — ABNORMAL HIGH (ref 11.4–15.2)

## 2021-07-12 LAB — CBC
HCT: 38.3 % (ref 36.0–46.0)
Hemoglobin: 11.8 g/dL — ABNORMAL LOW (ref 12.0–15.0)
MCH: 27.5 pg (ref 26.0–34.0)
MCHC: 30.8 g/dL (ref 30.0–36.0)
MCV: 89.3 fL (ref 80.0–100.0)
Platelets: 90 10*3/uL — ABNORMAL LOW (ref 150–400)
RBC: 4.29 MIL/uL (ref 3.87–5.11)
RDW: 18 % — ABNORMAL HIGH (ref 11.5–15.5)
WBC: 10 10*3/uL (ref 4.0–10.5)
nRBC: 0 % (ref 0.0–0.2)

## 2021-07-12 LAB — TROPONIN I (HIGH SENSITIVITY)
Troponin I (High Sensitivity): 216 ng/L (ref <18)
Troponin I (High Sensitivity): 156 ng/L (ref <18)

## 2021-07-12 LAB — LACTIC ACID, PLASMA: Lactic Acid, Venous: 1.2 mmol/L (ref 0.5–1.9)

## 2021-07-12 LAB — BRAIN NATRIURETIC PEPTIDE: B Natriuretic Peptide: 121.8 pg/mL — ABNORMAL HIGH (ref 0.0–100.0)

## 2021-07-12 LAB — APTT: aPTT: 98 seconds — ABNORMAL HIGH (ref 24–36)

## 2021-07-12 LAB — GLUCOSE, CAPILLARY
Glucose-Capillary: 104 mg/dL — ABNORMAL HIGH (ref 70–99)
Glucose-Capillary: 94 mg/dL (ref 70–99)

## 2021-07-12 MED ORDER — HEPARIN (PORCINE) 25000 UT/250ML-% IV SOLN
1350.0000 [IU]/h | INTRAVENOUS | Status: DC
  Administered 2021-07-12: 1250 [IU]/h via INTRAVENOUS
  Administered 2021-07-13 – 2021-07-15 (×4): 1400 [IU]/h via INTRAVENOUS
  Filled 2021-07-12 (×6): qty 250

## 2021-07-12 MED ORDER — HEPARIN BOLUS VIA INFUSION
2500.0000 [IU] | Freq: Once | INTRAVENOUS | Status: AC
  Administered 2021-07-12: 2500 [IU] via INTRAVENOUS
  Filled 2021-07-12: qty 2500

## 2021-07-12 MED ORDER — SODIUM CHLORIDE 0.9 % IV SOLN
3.0000 g | Freq: Four times a day (QID) | INTRAVENOUS | Status: AC
  Administered 2021-07-12 – 2021-07-16 (×17): 3 g via INTRAVENOUS
  Filled 2021-07-12 (×17): qty 8

## 2021-07-12 MED ORDER — IOHEXOL 350 MG/ML SOLN
80.0000 mL | Freq: Once | INTRAVENOUS | Status: AC | PRN
  Administered 2021-07-12: 80 mL via INTRAVENOUS

## 2021-07-12 MED ORDER — AMOXICILLIN-POT CLAVULANATE 875-125 MG PO TABS: 1.0000 | ORAL_TABLET | Freq: Two times a day (BID) | ORAL | 0 refills | Status: DC

## 2021-07-12 NOTE — Consult Note (Addendum)
NAME:  Stephanie Alvarado, MRN:  [id number], DOB:  [birth year], LOS: 5 ADMISSION DATE:  07/06/2021, CONSULTATION DATE:  07/12/21 REFERRING MD:  Dr. Florene Glen, [facility], CHIEF COMPLAINT:  SOB   History of Present Illness:  81 y/o F who presented to [facility] ER on 1/24 with reports of severe abdominal pain.    Work up for abdominal pain consistent with small bowel diverticulitis.  She was admitted for further care per [facility], treated with IV antibiotics.  Hospital course complicated by AKI, electrolyte disturbances including hypokalemia, thrombocytopenia and acute on chronic hypoxic respiratory failure.  She had improved and was  pended for potential discharge on 1/30 but was found to be short of breath and hypoxic on ambulation.   She also notes some chest tenderness with palpation on her L lower anterior chest.  No change in her work of breathing that she can feel.  She does note fatigue for the past month or so, especially with exertion.   No recent swelling or tenderness in the legs.   This was evaluated with CTA of the chest which showed extensive acute bilateral pulmonary emboli, with with RV/LV ratio of 0.91, localized airspace opacity in the RLL, small right pleural effusion.  Heparin bolus was initiated, with subsequent infusion.  BNP 121.  Troponin, LE doppler and ECHO pending.   PCCM consulted for consult for PE.  Pertinent  Medical History  CAD - s/p DES to circumflex 2006, LAD 9983  Chronic Diastolic CHF  HTN  Mitral Valve Prolapse  PVD  OSA - on CPAP for several years, has not really been using it lately.  On home oxygen intermittently, uses it when she feels short of breath; unclear diagnosis CVA - 2010, s/p tPA  Subclavian Artery Disease  Obesity  Vitamin B12 Deficiency  Osteoarthritis  Echo from 6/22: Right ventricular systolic function is normal. The right ventricular size is mild to moderately enlarged. There is normal pulmonary artery systolic pressure. The estimated right ventricular  systolic pressure is 38.2 mmHg.   Significant Hospital Events: Including procedures, antibiotic start and stop dates in addition to other pertinent events   1/24 Admit  1/30 PCCM consulted for evaluation of PE   Interim History / Subjective:    Objective   Blood pressure (!) 114/58, pulse 76, temperature 99 F (37.2 C), temperature source Oral, resp. rate 20, height 5\' 8"  (1.727 m), weight 90.8 kg, SpO2 (!) 80 %.        Intake/Output Summary (Last 24 hours) at 07/12/2021 1846 Last data filed at 07/12/2021 0500 Gross per 24 hour  Intake 240 ml  Output 500 ml  Net -260 ml   Filed Weights   07/06/21 2142  Weight: 90.8 kg    Examination: General: NAD, slight work of breathing on 4 L [place]  HENT: NCAT Lungs: CTAB  Cardiovascular: RRR no mgr  Abdomen: nt, nd, nbs  Extremities: no edema no erythema  Neuro:     Resolved Hospital Problem list     Assessment & Plan:   Acute Bilateral Submassive Pulmonary Embolism  sPESI score 3 (based on HFpEF grade 1 diastolic, age and hypoxia).  It seems she is not typically hypoxemic at rest (?).  Unclear why she qualifies for home oxygen chronically.  RV/LV ratio 0.91 which is the high end of normal.  Hemodynamically stable.  -continue heparin infusion  -Continue supplemental O2 as neede.  -await ECHO, LE findings  -follow BNP, troponin  -risk / benefits and indications for systemic lytics discussed  with patient  -no current role / need for systemic lytics  -agree with transferring to progressive for telemetry and continuous O2 monitoring.    Acute on Chronic Hypoxic Respiratory Failure  Undiagnosed chronic hypoxemia.  Baseline 3l O2 with sleep  OSA does not cause daytime or exertional hypoxia.   Also has a compensated resp acidosis for unclear reasons.   Should see pulmonary as outpatient and have work up.    OSA  -continue nocturnal CPAP & PRN daytime sleep    Best Practice (right click and "Reselect all SmartList Selections"  daily)  Per primary  Labs   CBC: Recent Labs  Lab 07/07/21 0430 07/08/21 0433 07/09/21 0509 07/10/21 0542 07/11/21 0518 07/12/21 0821  WBC 7.0 6.2 7.5 9.5 9.7 10.0  NEUTROABS 4.9 3.8 4.5 6.6 6.5  --   HGB 13.7 13.6 13.4 12.5 12.2 11.8*  HCT 43.5 44.0 44.4 40.7 39.6 38.3  MCV 90.1 89.6 91.9 90.8 89.6 89.3  PLT 125* 139* 126* 98* 95* 90*    Basic Metabolic Panel: Recent Labs  Lab 07/08/21 0433 07/08/21 0854 07/09/21 0509 07/10/21 0542 07/11/21 0518 07/12/21 0821  NA 134* 135 136 134* 132* 133*  K 5.2* 5.1 4.8 4.3 3.9 4.1  CL 101 103 101 100 97* 98  CO2 26 27 30 29 30 28   GLUCOSE 95 91 98 105* 109* 103*  BUN 10 14 15 12 15 14   CREATININE 1.28* 1.43* 1.15* 0.87 0.93 0.85  CALCIUM 9.3 9.4 9.6 9.2 9.5 9.2  MG 2.2  --  2.3 2.0 2.1  --   PHOS 3.8  --  3.5 2.7 3.0  --    GFR: Estimated Creatinine Clearance: 60.2 mL/min (by C-G formula based on SCr of 0.85 mg/dL). Recent Labs  Lab 07/07/21 0430 07/07/21 1726 07/08/21 0433 07/09/21 0509 07/10/21 0542 07/11/21 0518 07/12/21 0821  WBC 7.0  --    < > 7.5 9.5 9.7 10.0  LATICACIDVEN 0.9 0.9  --   --   --   --   --    < > = values in this interval not displayed.    Liver Function Tests: Recent Labs  Lab 07/07/21 0430 07/08/21 0433 07/09/21 0509 07/10/21 0542 07/11/21 0518  AST 17 20 16 17  13*  ALT 12 14 15 12 11   ALKPHOS 65 58 56 52 51  BILITOT 0.5 0.8 0.8 1.0 0.9  PROT 6.6 6.4* 6.4* 5.9* 6.1*  ALBUMIN 3.5 3.3* 3.3* 3.0* 2.9*   Recent Labs  Lab 07/06/21 2139  LIPASE 25   No results for input(s): AMMONIA in the last 168 hours.  ABG    Component Value Date/Time   PHART 7.372 04/10/2017 1356   PCO2ART 49.1 (H) 04/10/2017 1356   PO2ART 96.0 04/10/2017 1356   HCO3 30.7 (H) 07/10/2021 1224   TCO2 30 04/10/2017 1356   O2SAT 74.5 07/10/2021 1224     Coagulation Profile: No results for input(s): INR, PROTIME in the last 168 hours.  Cardiac Enzymes: No results for input(s): CKTOTAL, CKMB, CKMBINDEX,  TROPONINI in the last 168 hours.  HbA1C: Hgb A1c MFr Bld  Date/Time Value Ref Range Status  05/31/2016 06:41 AM 5.8 (H) 4.8 - 5.6 % Final    Comment:    (NOTE)         Pre-diabetes: 5.7 - 6.4         Diabetes: >6.4         Glycemic control for adults with diabetes: <7.0   11/12/2008 05:20 AM  4.6 - 6.1 % Final   5.9 (NOTE) The ADA recommends the following therapeutic goal for glycemic control related to Hgb A1c measurement: Goal of therapy: <6.5 Hgb A1c  Reference: American Diabetes Association: Clinical Practice Recommendations 2010, Diabetes Care, 2010, 33: (Suppl  1).    CBG: Recent Labs  Lab 07/11/21 0729 07/11/21 1600 07/11/21 2335 07/12/21 0806 07/12/21 1556  GLUCAP 99 127* 142* 104* 94    Review of Systems: Positives in Bud   Gen: Denies fever, chills, weight change, fatigue, night sweats HEENT: Denies blurred vision, double vision, hearing loss, tinnitus, sinus congestion, rhinorrhea, sore throat, neck stiffness, dysphagia PULM: Denies shortness of breath, cough, sputum production, hemoptysis, wheezing CV: Denies chest pain, edema, orthopnea, paroxysmal nocturnal dyspnea, palpitations GI: Denies abdominal pain, nausea, vomiting, diarrhea, hematochezia, melena, constipation, change in bowel habits GU: Denies dysuria, hematuria, polyuria, oliguria, urethral discharge Endocrine: Denies hot or cold intolerance, polyuria, polyphagia or appetite change Derm: Denies rash, dry skin, scaling or peeling skin change Heme: Denies easy bruising, bleeding, bleeding gums Neuro: Denies headache, numbness, weakness, slurred speech, loss of memory or consciousness  Past Medical History:  She,  has a past medical history of Achilles tendon rupture, right, initial encounter, Allergic rhinitis, cause unspecified, Anemia, unspecified, Anxiety state, unspecified, Atherosclerosis of aorta (HCC), CAD (coronary artery disease), Carotid artery disease (HCC), Chronic diastolic heart failure  ([place]), Cyst of thyroid, Diverticulosis of colon (without mention of hemorrhage), Dysphasia, Ejection fraction, Esophageal reflux, Hiatal hernia, Hyperlipemia, Hypertension, Lumbago, Lumbar disc disease, MVP (mitral valve prolapse), Neuropathy, Nonspecific abnormal finding in stool contents, Obesity, unspecified, Osteoarthrosis, unspecified whether generalized or localized, unspecified site, Osteopenia, Other diseases of lung, not elsewhere classified, Palpitations, Peripheral vascular disease, unspecified ([place]), Personal history of colonic polyps, PFO (patent foramen ovale), PONV (postoperative nausea and vomiting), Shortness of breath, Sleep apnea, Sleep related hypoventilation/hypoxemia in conditions classifiable elsewhere, Stroke ([place]) (11/2008), Subclavian artery disease ([place]), Unspecified cerebral artery occlusion with cerebral infarction, Unspecified venous (peripheral) insufficiency, and Vitamin B 12 deficiency.   Surgical History:   Past Surgical History:  Procedure Laterality Date   ACHILLES TENDON SURGERY Right 04/05/2018   Procedure: Right achilles tendon reconstruction;  Surgeon: Wylene Simmer, MD;  Location: [facility];  Service: Orthopedics;  Laterality: Right;  53min   BACK SURGERY     cataracts     both eyes   CHOLECYSTECTOMY     CORONARY ANGIOPLASTY WITH STENT PLACEMENT  1990's   GASTROCNEMIUS RECESSION Right 04/05/2018   Procedure: Gastroc recession;  Surgeon: Wylene Simmer, MD;  Location: [facility];  Service: Orthopedics;  Laterality: Right;   LAPAROSCOPIC HYSTERECTOMY     LUMBAR DISC SURGERY     X 3   RIGHT/LEFT HEART CATH AND CORONARY ANGIOGRAPHY N/A 04/10/2017   Procedure: RIGHT/LEFT HEART CATH AND CORONARY ANGIOGRAPHY;  Surgeon: Belva Crome, MD;  Location: [facility];  Service: Cardiovascular;  Laterality: N/A;   TOTAL KNEE ARTHROPLASTY Right 01/14/2019   Procedure: TOTAL KNEE ARTHROPLASTY;  Surgeon: Gaynelle Arabian, MD;  Location: [facility];  Service: Orthopedics;  Laterality: Right;  35min   TOTAL KNEE ARTHROPLASTY Left 12/23/2019   Procedure: TOTAL KNEE ARTHROPLASTY;  Surgeon: Gaynelle Arabian, MD;  Location: [facility];  Service: Orthopedics;  Laterality: Left;  32min   urethral suspension  1993   Dr. Nori Riis   VARICOSE VEIN SURGERY     x 2     Social History:   reports that she quit smoking about 23 years ago. Her  smoking use included cigarettes. She has a 40.00 pack-year smoking history. She has never used smokeless tobacco. She reports that she does not drink alcohol and does not use drugs.   Family History:  Her family history includes Alzheimer's disease in her mother; Breast cancer in an other family member; Colon cancer in her paternal aunt; Heart attack in her father; Heart disease in her father. There is no history of Esophageal cancer, Rectal cancer, or Stomach cancer.   Allergies Allergies  Allergen Reactions   Codeine Itching    REACTION: itch     Home Medications  Prior to Admission medications   Medication Sig Start Date End Date Taking? Authorizing Provider  amLODipine (NORVASC) 5 MG tablet Take 5 mg by mouth every morning. 05/17/21  Yes [provider]  amoxicillin-clavulanate (AUGMENTIN) 875-125 MG tablet Take 1 tablet by mouth 2 (two) times daily for 4 days. 07/12/21 07/16/21 Yes Elodia Florence., MD  buPROPion (WELLBUTRIN XL) 150 MG 24 hr tablet Take 300 mg by mouth daily. 10/31/15  Yes [provider]  docusate sodium (COLACE) 100 MG capsule Take 100 mg by mouth daily as needed for mild constipation.   Yes [provider]  furosemide (LASIX) 40 MG tablet Take 40 mg by mouth daily as needed for edema.   Yes [provider]  gabapentin (NEURONTIN) 300 MG capsule Take 600 mg by mouth 3 (three) times daily.  10/31/15  Yes [provider]  isosorbide mononitrate (IMDUR) 30 MG 24 hr tablet Take 30 mg by mouth at bedtime.    Yes [provider]  metoprolol  tartrate (LOPRESSOR) 50 MG tablet Take 50 mg by mouth 2 (two) times daily.    Yes [provider]  Multiple Vitamin (MULTIVITAMIN) capsule Take 1 capsule by mouth daily.   Yes [provider]  mupirocin ointment (BACTROBAN) 2 % Apply 1 application topically daily. 06/24/21  Yes [provider]  pantoprazole (PROTONIX) 40 MG tablet Take 40 mg by mouth 2 (two) times daily.   Yes [provider]  polyethylene glycol powder (GLYCOLAX/MIRALAX) powder Take 17 g by mouth daily as needed for moderate constipation.    Yes Sable Feil, MD  polyvinyl alcohol (LIQUIFILM TEARS) 1.4 % ophthalmic solution Place 1 drop into both eyes as needed for dry eyes.   Yes [provider]  rosuvastatin (CRESTOR) 10 MG tablet Take 10 mg by mouth at bedtime.   Yes [provider]  bisacodyl (DULCOLAX) 10 MG suppository Place 1 suppository (10 mg total) rectally daily as needed for moderate constipation. Patient not taking: Reported on 07/06/2021 12/25/19   Edmisten, Drue Dun L, PA  cephALEXin (KEFLEX) 500 MG capsule Take 1 capsule (500 mg total) by mouth 3 (three) times daily. Patient not taking: Reported on 07/06/2021 06/24/21   Landis Martins, DPM  linaclotide Rolan Lipa) 145 MCG CAPS capsule Take 1 capsule (145 mcg total) by mouth daily before breakfast. Patient not taking: Reported on 07/06/2021 06/25/20   Zehr, Laban Emperor, PA-C  nitroGLYCERIN (NITROSTAT) 0.4 MG SL tablet Place 0.4 mg under the tongue every 5 (five) minutes as needed for chest pain (TAKE UP TO 3 DOSES BEFORE CALLING 911). Patient not taking: Reported on 07/06/2021    [provider]  PFIZER COVID-19 [place] injection  04/01/21   [provider]     Critical care time: 45 minutes

## 2021-07-12 NOTE — Progress Notes (Signed)
Subjective: CC: Patient reports she did well with liquids yesterday. Had some lower abdominal discomfort mid day yesterday that has since resolved. No current abdominal pain. No n/v. Passing flatus. Had multiple liquid bm's yesterday. Did not eat dinner yesterday because she did not want to have another bm. About to eat bacon, eggs and pancakes for breakfast. Afebrile. WBC wnl yesterday.   Objective: Vital signs in last 24 hours: Temp:  [97.5 F (36.4 C)-99.5 F (37.5 C)] 98.3 F (36.8 C) (01/30 0530) Pulse Rate:  [71-88] 71 (01/30 0530) Resp:  [16-24] 16 (01/30 0530) BP: (111-131)/(53-61) 111/61 (01/30 0530) SpO2:  [89 %-96 %] 94 % (01/30 0530) Last BM Date: 07/11/21  Intake/Output from previous day: 01/29 0701 - 01/30 0700 In: 720 [P.O.:720] Out: 500 [Urine:500] Intake/Output this shift: No intake/output data recorded.  PE: Gen:  Alert, NAD, pleasant Abd: soft, ND, NT, +BS  Lab Results:  Recent Labs    07/10/21 0542 07/11/21 0518  WBC 9.5 9.7  HGB 12.5 12.2  HCT 40.7 39.6  PLT 98* 95*   BMET Recent Labs    07/11/21 0518 07/12/21 0821  NA 132* 133*  K 3.9 4.1  CL 97* 98  CO2 30 28  GLUCOSE 109* 103*  BUN 15 14  CREATININE 0.93 0.85  CALCIUM 9.5 9.2   PT/INR No results for input(s): LABPROT, INR in the last 72 hours. CMP     Component Value Date/Time   NA 133 (L) 07/12/2021 0821   NA 143 03/07/2019 1534   K 4.1 07/12/2021 0821   CL 98 07/12/2021 0821   CO2 28 07/12/2021 0821   GLUCOSE 103 (H) 07/12/2021 0821   BUN 14 07/12/2021 0821   BUN 13 03/07/2019 1534   CREATININE 0.85 07/12/2021 0821   CALCIUM 9.2 07/12/2021 0821   PROT 6.1 (L) 07/11/2021 0518   ALBUMIN 2.9 (L) 07/11/2021 0518   AST 13 (L) 07/11/2021 0518   ALT 11 07/11/2021 0518   ALKPHOS 51 07/11/2021 0518   BILITOT 0.9 07/11/2021 0518   GFRNONAA >60 07/12/2021 0821   GFRAA >60 12/25/2019 0313   Lipase     Component Value Date/Time   LIPASE 25 07/06/2021 2139     Studies/Results: DG Chest 2 View  Result Date: 07/11/2021 CLINICAL DATA:  Chest pain. EXAM: CHEST - 2 VIEW COMPARISON:  July 08, 2021. FINDINGS: Stable cardiomediastinal silhouette. Both lungs are clear. The visualized skeletal structures are unremarkable. IMPRESSION: No active cardiopulmonary disease. Aortic Atherosclerosis (ICD10-I70.0). Electronically Signed   By: Marijo Conception M.D.   On: 07/11/2021 16:25    Anti-infectives: Anti-infectives (From admission, onward)    Start     Dose/Rate Route Frequency Ordered Stop   07/07/21 1200  piperacillin-tazobactam (ZOSYN) IVPB 3.375 g        3.375 g 12.5 mL/hr over 240 Minutes Intravenous Every 8 hours 07/07/21 0439     07/07/21 0415  piperacillin-tazobactam (ZOSYN) IVPB 3.375 g        3.375 g 100 mL/hr over 30 Minutes Intravenous  Once 07/07/21 0413 07/07/21 0505   07/07/21 0000  amoxicillin-clavulanate (AUGMENTIN) 875-125 MG per tablet 1 tablet        1 tablet Oral  Once 07/06/21 2353 07/07/21 0003        Assessment/Plan Small bowel diverticulitis - CT scan 1/24 showed diverticulitis of the small bowel in the mid abdomen with no diverticular abscess or perforation - WBC wnl yesterday. Afebrile. Pain resolved. NT on exam.  Tolerating FLD and having bowel function. If tolerates soft diet today, okay for d/c from our standpoint on course of abx.   FEN - Soft VTE - SCDs, okay for chemical prophylaxis from a general surgery standpoint ID - Zosyn  CAD status post stenting Chronic diastolic CHF HTN HLD Sleep apnea, uses home oxygen 2-3L PRN  This care required straightforward level of medical decision making.    LOS: 5 days    Jillyn Ledger , [facility] Surgery 07/12/2021, 8:49 AM Please see Amion for pager number during day hours 7:00am-4:30pm

## 2021-07-12 NOTE — Progress Notes (Addendum)
PROGRESS NOTE    Stephanie Alvarado  JEH:[id number] DOB: 1940-09-08 DOA: 07/06/2021 PCP: Caryl Bis, MD  Chief Complaint  Patient presents with   Abdominal Pain    Brief Narrative:  Stephanie Alvarado is Stephanie Alvarado 81 y.o. female with history of CAD status post stenting, chronic diastolic CHF, hypertension, sleep apnea uses oxygen at home presents to the ER after patient started having severe abdominal pain involving the mid abdomen and right lower quadrant since 2 PM yesterday.  Pain is constant had some episodes of vomiting no blood in the vomitus denies any diarrhea fever or chills.  She's been found to have small bowel diverticulitis.  She'd improved on IV antibiotics.  Plan was for potential discharge on 1/30, but she was hypoxic with ambulation and CT PE showed PE with right heart strain.  She's been started on heparin.  Echo and LE [place] pending.  See below for additional details    Assessment & Plan:   Principal Problem:   Pulmonary embolism ([place]) Active Problems:   Diverticulitis   AKI (acute kidney injury) ([place])   Chronic diastolic CHF (congestive heart failure) (HCC)   Chronic respiratory failure with hypoxia, on home oxygen therapy (HCC)   Hypokalemia   Essential hypertension   CAD (coronary artery disease)   Thrombocytopenia (HCC)   Daytime sleepiness   Right-sided chest pain   Acute on chronic respiratory failure with hypoxia (HCC)  Assessment and Plan: * Pulmonary embolism (HCC) With evidence of right heart strain on CT Follow echo and LE [place] BNP mildly elevated Follow troponins PESI class 3-4 (uses O2 as needed at home, but appears generally this is just for OSA) Start heparin gtt, 24 hrs bed rest Given high risk pesi, will discuss with PCCM Lytics if decompensation    Diverticulitis- (present on admission) CT with small bowel diverticulitis Continue abx -> narrow to unasyn Appreciate surgery assistance Advance to soft diet per surgery Mobilize This has  improved   AKI (acute kidney injury) ([place]) AKI is improving with IVF Follow UA ([place]) and urine studies Possibly contrast related? [place] without hydro Continue IVF for now Improved  Hypokalemia follow  Chronic respiratory failure with hypoxia, on home oxygen therapy (HCC) On oxygen at night for sleep apnea Says she uses as needed generally   Chronic diastolic CHF (congestive heart failure) (HCC) Appears euvolemic, follow volume status closely  Daytime sleepiness VBG with respiratory acidosis, but compensated with normal pH  Will order nightly CPAP (she uses O2 at night for OSA)  Thrombocytopenia (HCC) Chronic, follow  Continues to gradually trend down Watch closely with need for anticoagulation given PE  CAD (coronary artery disease)- (present on admission) No sx ACS  Essential hypertension- (present on admission) Amlodipine, imdur, metoprolol  Right-sided chest pain Musculoskeletal pain - tender to palpation Not c/w ACS Will add voltaren  2 view CXR without active disease   DVT prophylaxis: SCD Code Status: full Family Communication: none - sig other at bedside, called son, no answer Disposition:   Status is: Inpatient  Remains inpatient appropriate because: need for IV abx, surgery c/s       Consultants:  surgery  Procedures:  none  Antimicrobials:  Anti-infectives (From admission, onward)    Start     Dose/Rate Route Frequency Ordered Stop   07/12/21 0000  amoxicillin-clavulanate (AUGMENTIN) 875-125 MG tablet        1 tablet Oral 2 times daily 07/12/21 1246 07/16/21 2359   07/07/21 1200  piperacillin-tazobactam (ZOSYN) IVPB 3.375  g        3.375 g 12.5 mL/hr over 240 Minutes Intravenous Every 8 hours 07/07/21 0439     07/07/21 0415  piperacillin-tazobactam (ZOSYN) IVPB 3.375 g        3.375 g 100 mL/hr over 30 Minutes Intravenous  Once 07/07/21 0413 07/07/21 0505   07/07/21 0000  amoxicillin-clavulanate (AUGMENTIN) 875-125 MG per tablet 1  tablet        1 tablet Oral  Once 07/06/21 2353 07/07/21 0003       Subjective: C/o more sob with exertion than normal  Objective: Vitals:   07/11/21 2258 07/12/21 0100 07/12/21 0530 07/12/21 1251  BP:   111/61 (!) 114/58  Pulse:   71 76  Resp: (!) 24 (!) 23 16 20   Temp:   98.3 F (36.8 C) 99 F (37.2 C)  TempSrc:    Oral  SpO2:   94% (!) 80%  Weight:      Height:        Intake/Output Summary (Last 24 hours) at 07/12/2021 1814 Last data filed at 07/12/2021 0500 Gross per 24 hour  Intake 240 ml  Output 500 ml  Net -260 ml   Filed Weights   07/06/21 2142  Weight: 90.8 kg    Examination:  General: No acute distress. Cardiovascular: RRR Lungs: unlabored, desatting on RA Abdomen: Soft, nontender, nondistended Neurological: Alert and oriented 3. Moves all extremities 4 . Cranial nerves II through XII grossly intact. Skin: Warm and dry. No rashes or lesions. Extremities: No clubbing or cyanosis. No edema.   Data Reviewed: I have personally reviewed following labs and imaging studies  CBC: Recent Labs  Lab 07/07/21 0430 07/08/21 0433 07/09/21 0509 07/10/21 0542 07/11/21 0518 07/12/21 0821  WBC 7.0 6.2 7.5 9.5 9.7 10.0  NEUTROABS 4.9 3.8 4.5 6.6 6.5  --   HGB 13.7 13.6 13.4 12.5 12.2 11.8*  HCT 43.5 44.0 44.4 40.7 39.6 38.3  MCV 90.1 89.6 91.9 90.8 89.6 89.3  PLT 125* 139* 126* 98* 95* 90*    Basic Metabolic Panel: Recent Labs  Lab 07/08/21 0433 07/08/21 0854 07/09/21 0509 07/10/21 0542 07/11/21 0518 07/12/21 0821  NA 134* 135 136 134* 132* 133*  K 5.2* 5.1 4.8 4.3 3.9 4.1  CL 101 103 101 100 97* 98  CO2 26 27 30 29 30 28   GLUCOSE 95 91 98 105* 109* 103*  BUN 10 14 15 12 15 14   CREATININE 1.28* 1.43* 1.15* 0.87 0.93 0.85  CALCIUM 9.3 9.4 9.6 9.2 9.5 9.2  MG 2.2  --  2.3 2.0 2.1  --   PHOS 3.8  --  3.5 2.7 3.0  --     GFR: Estimated Creatinine Clearance: 60.2 mL/min (by C-G formula based on SCr of 0.85 mg/dL).  Liver Function  Tests: Recent Labs  Lab 07/07/21 0430 07/08/21 0433 07/09/21 0509 07/10/21 0542 07/11/21 0518  AST 17 20 16 17  13*  ALT 12 14 15 12 11   ALKPHOS 65 58 56 52 51  BILITOT 0.5 0.8 0.8 1.0 0.9  PROT 6.6 6.4* 6.4* 5.9* 6.1*  ALBUMIN 3.5 3.3* 3.3* 3.0* 2.9*    CBG: Recent Labs  Lab 07/11/21 0729 07/11/21 1600 07/11/21 2335 07/12/21 0806 07/12/21 1556  GLUCAP 99 127* 142* 104* 94     Recent Results (from the past 240 hour(s))  Resp Panel by RT-PCR (Flu Abdulloh Ullom&B, Covid) Nasopharyngeal Swab     Status: None   Collection Time: 07/07/21  2:10 AM   Specimen: Nasopharyngeal  Swab; Nasopharyngeal(NP) swabs in vial transport medium  Result Value Ref Range Status   SARS Coronavirus 2 by RT PCR NEGATIVE NEGATIVE Final    Comment: (NOTE) SARS-CoV-2 target nucleic acids are NOT DETECTED.  The SARS-CoV-2 RNA is generally detectable in upper respiratory specimens during the acute phase of infection. The lowest concentration of SARS-CoV-2 viral copies this assay can detect is 138 copies/mL. Padraig Nhan negative result does not preclude SARS-Cov-2 infection and should not be used as the sole basis for treatment or other patient management decisions. Dola Lunsford negative result may occur with  improper specimen collection/handling, submission of specimen other than nasopharyngeal swab, presence of viral mutation(s) within the areas targeted by this assay, and inadequate number of viral copies(<138 copies/mL). Jacques Willingham negative result must be combined with clinical observations, patient history, and epidemiological information. The expected result is Negative.  Fact Sheet for Patients:  EntrepreneurPulse.com.au  Fact Sheet for Healthcare Providers:  IncredibleEmployment.be  This test is no t yet approved or cleared by the [place] FDA and  has been authorized for detection and/or diagnosis of SARS-CoV-2 by FDA under an Emergency Use Authorization (EUA). This EUA will remain   in effect (meaning this test can be used) for the duration of the COVID-19 declaration under Section 564(b)(1) of the Act, 21 U.S.C.section 360bbb-3(b)(1), unless the authorization is terminated  or revoked sooner.       Influenza Tavian Callander by PCR NEGATIVE NEGATIVE Final   Influenza B by PCR NEGATIVE NEGATIVE Final    Comment: (NOTE) The Xpert Xpress SARS-CoV-2/FLU/RSV plus assay is intended as an aid in the diagnosis of influenza from Nasopharyngeal swab specimens and should not be used as Romulus Hanrahan sole basis for treatment. Nasal washings and aspirates are unacceptable for Xpert Xpress SARS-CoV-2/FLU/RSV testing.  Fact Sheet for Patients: EntrepreneurPulse.com.au  Fact Sheet for Healthcare Providers: IncredibleEmployment.be  This test is not yet approved or cleared by the [place] FDA and has been authorized for detection and/or diagnosis of SARS-CoV-2 by FDA under an Emergency Use Authorization (EUA). This EUA will remain in effect (meaning this test can be used) for the duration of the COVID-19 declaration under Section 564(b)(1) of the Act, 21 U.S.C. section 360bbb-3(b)(1), unless the authorization is terminated or revoked.  Performed at [facility], [place] [address], [place], [place] [address]          Radiology Studies: DG Chest 2 View  Result Date: 07/11/2021 CLINICAL DATA:  Chest pain. EXAM: CHEST - 2 VIEW COMPARISON:  July 08, 2021. FINDINGS: Stable cardiomediastinal silhouette. Both lungs are clear. The visualized skeletal structures are unremarkable. IMPRESSION: No active cardiopulmonary disease. Aortic Atherosclerosis (ICD10-I70.0). Electronically Signed   By: Marijo Conception M.D.   On: 07/11/2021 16:25   CT Angio Chest Pulmonary Embolism (PE) W or WO Contrast  Result Date: 07/12/2021 CLINICAL DATA:  Chest pain/shortness breath. EXAM: CT ANGIOGRAPHY CHEST WITH CONTRAST TECHNIQUE: Multidetector CT imaging of the  chest was performed using the standard protocol during bolus administration of intravenous contrast. Multiplanar CT image reconstructions and MIPs were obtained to evaluate the vascular anatomy. RADIATION DOSE REDUCTION: This exam was performed according to the departmental dose-optimization program which includes automated exposure control, adjustment of the mA and/or kV according to patient size and/or use of iterative reconstruction technique. CONTRAST:  62mL OMNIPAQUE IOHEXOL 350 MG/ML SOLN COMPARISON:  Chest radiograph 07/11/2021 and chest CT 05/31/2016 FINDINGS: Cardiovascular: There is substantial acute bilateral pulmonary embolus in lobar branches of the right upper lobe, right middle lobe,  left upper lobe, and left lower lobe with bridging thrombus in the left main pulmonary artery and in the right pulmonary artery. Clot burden is high. Right ventricular to left ventricular ratio 0.91. Coronary, aortic arch, and branch vessel atherosclerotic vascular disease. Mediastinum/Nodes: Hypodense right thyroid lesion up to 4.2 cm in long axis on image 16 series 5, previously about 3.4 cm. It appears that this nodule was probably biopsied on 04/25/2012. Small type 1 hernia. Lungs/Pleura: There is some patchy indistinct airspace opacity in the right lower lobe potentially from pulmonary hemorrhage, pneumonia, or localized edema. Small right pleural effusion. Upper Abdomen: Atherosclerosis of the abdominal aorta and its branches. Musculoskeletal: Thoracic kyphosis and spondylosis. Suspected lipoma in the right proximal upper arm on image 5 series 4. Review of the MIP images confirms the above findings. IMPRESSION: 1. Extensive bilateral pulmonary embolus. Clot burden is high. Positive for acute PE with CT evidence of right heart strain (RV/LV Ratio = 0.91) consistent with at least submassive (intermediate risk) PE. The presence of right heart strain has been associated with an increased risk of morbidity and mortality.  Please refer to the "PE Focused" order set in EPIC. 2. Hypodense right thyroid nodule, this appears to of been biopsied on 04/25/2012. 3. Small type 1 hiatal hernia. 4. Localized airspace opacity in the right lower lobe potentially from pulmonary hemorrhage, pneumonia, or localized edema. Small right pleural effusion. 5. Aortic Atherosclerosis (ICD10-I70.0). Coronary and systemic atherosclerosis. Critical Value/emergent results were called by telephone at the time of interpretation on 07/12/2021 at 5:48 pm to provider Dr. Florene Glen , who verbally acknowledged these results. Electronically Signed   By: Van Clines M.D.   On: 07/12/2021 17:54        Scheduled Meds:  amLODipine  5 mg Oral q morning   buPROPion  300 mg Oral Daily   diclofenac Sodium  2 g Topical QID   feeding supplement  1 Container Oral TID BM   gabapentin  600 mg Oral TID   isosorbide mononitrate  30 mg Oral QHS   metoprolol tartrate  50 mg Oral BID   pantoprazole  40 mg Oral BID   rosuvastatin  10 mg Oral QHS   Continuous Infusions:  piperacillin-tazobactam (ZOSYN)  IV 3.375 g (07/12/21 1613)     LOS: 5 days    Time spent: over 30 min 50 min critical care time with submassive PE   Fayrene Helper, MD [facility]   To contact the attending provider between 7A-7P or the covering provider during after hours 7P-7A, please log into the web site [email or web address] and access using universal [place] password for that web site. If you do not have the password, please call the hospital operator.  07/12/2021, 6:14 PM

## 2021-07-12 NOTE — Assessment & Plan Note (Addendum)
Provoked in setting of this hospitalization With evidence of right heart strain on CT Echo with normal RVSF and size, EF 27-25%, grade 1 diastolic dysfunction LE [place] with DVT involving right posterior tibial veins BNP mildly elevated Follow troponins, elevated, but downtrending - likely in setting of PE PESI class 3-4 (uses O2 as needed at home, but appears generally this is just for OSA) S/p 24 hrs bed rest around 6 PM today, d/c bed rest after this Given high risk pesi, will discuss with PCCM - no plans for lytics at this point Lytics if decompensation

## 2021-07-12 NOTE — Progress Notes (Signed)
Pharmacy Antibiotic Note  Stephanie Alvarado is a 81 y.o. female admitted on 07/06/2021 with diverticulitis.  Pharmacy has been consulted for Unasyn dosing.  Plan: Unasyn 3g IV q6h No dose adjustments needed, Pharmacy will sign-off note writing but follow renal function and adjust as needed  Height: 5\' 8"  (172.7 cm) Weight: 90.8 kg (200 lb 2.8 oz) IBW/kg (Calculated) : 63.9  Temp (24hrs), Avg:98.8 F (37.1 C), Min:98.3 F (36.8 C), Max:99.5 F (37.5 C)  Recent Labs  Lab 07/07/21 0430 07/07/21 1726 07/08/21 0433 07/08/21 0854 07/09/21 0509 07/10/21 0542 07/11/21 0518 07/12/21 0821 07/12/21 1847  WBC 7.0  --  6.2  --  7.5 9.5 9.7 10.0  --   CREATININE 0.63  --  1.28* 1.43* 1.15* 0.87 0.93 0.85  --   LATICACIDVEN 0.9 0.9  --   --   --   --   --   --  1.2    Estimated Creatinine Clearance: 60.2 mL/min (by C-G formula based on SCr of 0.85 mg/dL).    Allergies  Allergen Reactions   Codeine Itching    REACTION: itch    Antimicrobials this admission: 1/25 Zosyn >> 1/30 1/30 Unasyn >>  Dose adjustments this admission:  Microbiology results:  Thank you for allowing pharmacy to be a part of this patients care.  Peggyann Juba, PharmD, BCPS Pharmacy: [phone] 07/12/2021 7:36 PM

## 2021-07-12 NOTE — Progress Notes (Signed)
ANTICOAGULATION CONSULT NOTE - Initial Consult  Pharmacy Consult for Heparin Indication: pulmonary embolus  Allergies  Allergen Reactions   Codeine Itching    REACTION: itch    Patient Measurements: Height: 5\' 8"  (172.7 cm) Weight: 90.8 kg (200 lb 2.8 oz) IBW/kg (Calculated) : 63.9 Heparin Dosing Weight: 83.2 kg  Vital Signs: Temp: 99 F (37.2 C) (01/30 1251) Temp Source: Oral (01/30 1251) BP: 114/58 (01/30 1251) Pulse Rate: 76 (01/30 1251)  Labs: Recent Labs    07/10/21 0542 07/11/21 0518 07/12/21 0821  HGB 12.5 12.2 11.8*  HCT 40.7 39.6 38.3  PLT 98* 95* 90*  CREATININE 0.87 0.93 0.85    Estimated Creatinine Clearance: 60.2 mL/min (by C-G formula based on SCr of 0.85 mg/dL).   Medical History: Past Medical History:  Diagnosis Date   Achilles tendon rupture, right, initial encounter    Allergic rhinitis, cause unspecified    Anemia, unspecified    Anxiety state, unspecified    Atherosclerosis of aorta ([place])    TEE, June, 2010, grade 4 aortic arch atherosclerosis , Dr. Aundra Dubin   CAD (coronary artery disease)    DES and circumflex 2006 /  DES to LAD 2011   Carotid artery disease ([place])    Doppler, November, 2011, stable, 0-39% bilateral, turbulent flow left subclavian   Chronic diastolic heart failure ([place])    Cyst of thyroid    Diverticulosis of colon (without mention of hemorrhage)    Dysphasia    Possibly esophageal stricture   Ejection fraction     EF 60%, Echo, November 12, 2008, /  EF 55%, TEE, November 17, 2008   Esophageal reflux    Hiatal hernia    Hyperlipemia    Hypertension    Lumbago    Lumbar disc disease    MVP (mitral valve prolapse)    Neuropathy    Nonspecific abnormal finding in stool contents    Obesity, unspecified    Osteoarthrosis, unspecified whether generalized or localized, unspecified site    Osteopenia    Other diseases of lung, not elsewhere classified    Palpitations    October, 2012   Peripheral vascular disease, unspecified  ([place])    Personal history of colonic polyps    ADENOMATOUS POLYP   PFO (patent foramen ovale)    Patient had a very small PFO by TEE 2010.  This was seen only by bubble analysis during Valsalva   PONV (postoperative nausea and vomiting)    Shortness of breath    Sleep apnea    uses CPAP nightly   Sleep related hypoventilation/hypoxemia in conditions classifiable elsewhere    Stroke [facility]) 11/2008   Treated with TPA? 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 Victorino Dike.   Unspecified cerebral artery occlusion with cerebral infarction    Unspecified venous (peripheral) insufficiency    Vitamin B 12 deficiency     Medications:  Scheduled:   amLODipine  5 mg Oral q morning   buPROPion  300 mg Oral Daily   diclofenac Sodium  2 g Topical QID   feeding supplement  1 Container Oral TID BM   gabapentin  600 mg Oral TID   isosorbide mononitrate  30 mg Oral QHS   metoprolol tartrate  50 mg Oral BID   pantoprazole  40 mg Oral BID   rosuvastatin  10 mg Oral QHS  Infusions:   piperacillin-tazobactam (ZOSYN)  IV 3.375 g (07/12/21 1613)   PRN: acetaminophen, lip balm, oxyCODONE  Assessment: 81 yo female admitted with abdominal pain, found to have diverticulitis.  On 1/30, developed hypoxia on ambulation and CTa showed PE with right heart strain.  Pharmacy consulted to dose IV heparin. Of note, has thrombocytopenia which is chronic, d/w MD, ok to proceed with heparin since no bleeding currently reported.  Goal of Therapy:  Heparin level 0.3-0.7 units/ml Monitor platelets by anticoagulation protocol: Yes   Plan:  Give 2500 units bolus x 1 Start heparin infusion at 1250 units/hr Check anti-Xa level in 8 hours and daily while on heparin Continue to monitor H&H and platelets  Peggyann Juba, PharmD, BCPS Pharmacy: [phone] 07/12/2021,6:13 PM

## 2021-07-12 NOTE — Progress Notes (Signed)
SATURATION QUALIFICATIONS: (This note is used to comply with regulatory documentation for home oxygen)  Patient Saturations on Room Air at Rest = 80%  Patient Saturations on Room Air while Ambulating = 78%  Patient Saturations on 3 Liters of oxygen while Ambulating = 85%  Please briefly explain why patient needs home oxygen: Patient would benefit from home oxygen due to the decline of oxygen saturation at rest and ambulation on room air.

## 2021-07-12 NOTE — Care Management Important Message (Signed)
Important Message  Patient Details IM Letter given to the Patient. Name: Stephanie Alvarado MRN: [id number] Date of Birth: February 12, 1941   Medicare Important Message Given:  Yes     Kerin Salen 07/12/2021, 2:46 PM

## 2021-07-12 NOTE — Progress Notes (Signed)
This Probation officer will assist pt with cpap around midnight, per her request.  RN aware.

## 2021-07-13 ENCOUNTER — Inpatient Hospital Stay (HOSPITAL_COMMUNITY): Payer: PPO

## 2021-07-13 DIAGNOSIS — I2609 Other pulmonary embolism with acute cor pulmonale: Secondary | ICD-10-CM

## 2021-07-13 DIAGNOSIS — I2699 Other pulmonary embolism without acute cor pulmonale: Secondary | ICD-10-CM

## 2021-07-13 DIAGNOSIS — R918 Other nonspecific abnormal finding of lung field: Secondary | ICD-10-CM

## 2021-07-13 LAB — CBC
HCT: 38.1 % (ref 36.0–46.0)
Hemoglobin: 11.8 g/dL — ABNORMAL LOW (ref 12.0–15.0)
MCH: 27.8 pg (ref 26.0–34.0)
MCHC: 31 g/dL (ref 30.0–36.0)
MCV: 89.6 fL (ref 80.0–100.0)
Platelets: 93 10*3/uL — ABNORMAL LOW (ref 150–400)
RBC: 4.25 MIL/uL (ref 3.87–5.11)
RDW: 18 % — ABNORMAL HIGH (ref 11.5–15.5)
WBC: 9.4 10*3/uL (ref 4.0–10.5)
nRBC: 0 % (ref 0.0–0.2)

## 2021-07-13 LAB — COMPREHENSIVE METABOLIC PANEL
ALT: 11 U/L (ref 0–44)
AST: 16 U/L (ref 15–41)
Albumin: 2.6 g/dL — ABNORMAL LOW (ref 3.5–5.0)
Alkaline Phosphatase: 47 U/L (ref 38–126)
Anion gap: 6 (ref 5–15)
BUN: 15 mg/dL (ref 8–23)
CO2: 29 mmol/L (ref 22–32)
Calcium: 9.3 mg/dL (ref 8.9–10.3)
Chloride: 100 mmol/L (ref 98–111)
Creatinine, Ser: 0.86 mg/dL (ref 0.44–1.00)
GFR, Estimated: >60 mL/min (ref >60)
Glucose, Bld: 99 mg/dL (ref 70–99)
Potassium: 3.7 mmol/L (ref 3.5–5.1)
Sodium: 135 mmol/L (ref 135–145)
Total Bilirubin: 0.6 mg/dL (ref 0.3–1.2)
Total Protein: 5.9 g/dL — ABNORMAL LOW (ref 6.5–8.1)

## 2021-07-13 LAB — ECHOCARDIOGRAM COMPLETE
AR max vel: 3.02 cm2
AV Area VTI: 2.76 cm2
AV Area mean vel: 2.68 cm2
AV Mean grad: 3 mmHg
AV Peak grad: 6.4 mmHg
Ao pk vel: 1.26 m/s
Area-P 1/2: 4.17 cm2
Calc EF: 62.2 %
Height: 68 in
S' Lateral: 3.3 cm
Single Plane A2C EF: 65.4 %
Single Plane A4C EF: 61.4 %
Weight: 3202.84 oz

## 2021-07-13 LAB — GLUCOSE, CAPILLARY
Glucose-Capillary: 106 mg/dL — ABNORMAL HIGH (ref 70–99)
Glucose-Capillary: 108 mg/dL — ABNORMAL HIGH (ref 70–99)
Glucose-Capillary: 130 mg/dL — ABNORMAL HIGH (ref 70–99)
Glucose-Capillary: 92 mg/dL (ref 70–99)

## 2021-07-13 LAB — HEPARIN LEVEL (UNFRACTIONATED)
Heparin Unfractionated: 0.27 IU/mL — ABNORMAL LOW (ref 0.30–0.70)
Heparin Unfractionated: 0.35 IU/mL (ref 0.30–0.70)
Heparin Unfractionated: 0.4 IU/mL (ref 0.30–0.70)

## 2021-07-13 LAB — TROPONIN I (HIGH SENSITIVITY): Troponin I (High Sensitivity): 42 ng/L — ABNORMAL HIGH (ref <18)

## 2021-07-13 NOTE — Progress Notes (Signed)
ANTICOAGULATION CONSULT NOTE - Initial Consult  Pharmacy Consult for Heparin Indication: pulmonary embolus/DVT  Allergies  Allergen Reactions   Codeine Itching    REACTION: itch    Patient Measurements: Height: 5\' 8"  (172.7 cm) Weight: 90.8 kg (200 lb 2.8 oz) IBW/kg (Calculated) : 63.9 Heparin Dosing Weight: 83.2 kg  Vital Signs: Temp: 98.6 F (37 C) (01/31 1307) Temp Source: Oral (01/31 1307) BP: 124/58 (01/31 1307) Pulse Rate: 70 (01/31 1307)  Labs: Recent Labs    07/11/21 0518 07/12/21 9509 07/12/21 1847 07/12/21 2108 07/13/21 0207 07/13/21 0817 07/13/21 1223 07/13/21 1316 07/13/21 2006  HGB 12.2 11.8*  --   --  11.8*  --   --   --   --   HCT 39.6 38.3  --   --  38.1  --   --   --   --   PLT 95* 90*  --   --  93*  --   --   --   --   APTT  --   --  98*  --   --   --   --   --   --   LABPROT  --   --  16.0*  --   --   --   --   --   --   INR  --   --  1.3*  --   --   --   --   --   --   HEPARINUNFRC  --   --   --   --  0.27*  --  0.35  --  0.40  CREATININE 0.93 0.85  --   --   --  0.86  --   --   --   TROPONINIHS  --   --  216* 156*  --   --   --  42*  --      Estimated Creatinine Clearance: 59.5 mL/min (by C-G formula based on SCr of 0.86 mg/dL).   Medical History: Past Medical History:  Diagnosis Date   Achilles tendon rupture, right, initial encounter    Allergic rhinitis, cause unspecified    Anemia, unspecified    Anxiety state, unspecified    Atherosclerosis of aorta ([place])    TEE, June, 2010, grade 4 aortic arch atherosclerosis , Dr. Aundra Dubin   CAD (coronary artery disease)    DES and circumflex 2006 /  DES to LAD 2011   Carotid artery disease ([place])    Doppler, November, 2011, stable, 0-39% bilateral, turbulent flow left subclavian   Chronic diastolic heart failure ([place])    Cyst of thyroid    Diverticulosis of colon (without mention of hemorrhage)    Dysphasia    Possibly esophageal stricture   Esophageal reflux    Hiatal hernia     Hyperlipemia    Hypertension    Lumbago    Lumbar disc disease    MVP (mitral valve prolapse)    Neuropathy    Nonspecific abnormal finding in stool contents    Obesity, unspecified    Osteoarthrosis, unspecified whether generalized or localized, unspecified site    Osteopenia    Other diseases of lung, not elsewhere classified    Peripheral vascular disease, unspecified ([place])    Personal history of colonic polyps    ADENOMATOUS POLYP   PFO (patent foramen ovale)    Patient had a very small PFO by TEE 2010.  This was seen only by bubble analysis during Valsalva   PONV (postoperative nausea  and vomiting)    Sleep apnea    uses CPAP nightly   Sleep related hypoventilation/hypoxemia in conditions classifiable elsewhere    Stroke [facility]) 11/2008   Treated with TPA? 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 Victorino Dike.   Unspecified cerebral artery occlusion with cerebral infarction    Unspecified venous (peripheral) insufficiency    Vitamin B 12 deficiency     Medications:  Scheduled:   amLODipine  5 mg Oral q morning   buPROPion  300 mg Oral Daily   diclofenac Sodium  2 g Topical QID   feeding supplement  1 Container Oral TID BM   gabapentin  600 mg Oral TID   isosorbide mononitrate  30 mg Oral QHS   metoprolol tartrate  50 mg Oral BID   pantoprazole  40 mg Oral BID   rosuvastatin  10 mg Oral QHS   Infusions:   ampicillin-sulbactam (UNASYN) IV 3 g (07/13/21 1539)   heparin 1,400 Units/hr (07/13/21 1008)   PRN: acetaminophen, lip balm, oxyCODONE  Assessment: 81 yo female admitted with abdominal pain, found to have diverticulitis.  On 1/30, developed hypoxia on ambulation and CTa showed PE with right heart strain.  Dopplers on 1/31 showed acute DVT involving the R posterior tibial veins.  Pharmacy consulted to dose IV heparin. Of note, has thrombocytopenia which is chronic,  d/w MD, ok to proceed with heparin since no bleeding currently reported.  07/13/21 evening Confirmatory Heparin level = 0.4 (therapeutic) with heparin infusing at 1400 units/hr Hgb = 11.8, PLTC 93k (low but stable) No complications of therapy noted, no bleeding reported  Goal of Therapy:  Heparin level 0.3-0.7 units/ml Monitor platelets by anticoagulation protocol: Yes   Plan:  Continue heparin infusion at 1400 units/hr Follow daily CBC and heparin level Monitor for signs & symptoms of bleeding F/u ability to transition to oral anticoagulation  Royetta Asal, PharmD, [place] Please utilize Amion for appropriate phone number to reach the unit pharmacist ([place]) 07/13/2021 8:55 PM

## 2021-07-13 NOTE — Progress Notes (Signed)
° °  Echocardiogram 2D Echocardiogram has been performed.  Stephanie Alvarado 07/13/2021, 11:50 AM

## 2021-07-13 NOTE — Progress Notes (Signed)
ANTICOAGULATION CONSULT NOTE - Initial Consult  Pharmacy Consult for Heparin Indication: pulmonary embolus  Allergies  Allergen Reactions   Codeine Itching    REACTION: itch    Patient Measurements: Height: 5\' 8"  (172.7 cm) Weight: 90.8 kg (200 lb 2.8 oz) IBW/kg (Calculated) : 63.9 Heparin Dosing Weight: 83.2 kg  Vital Signs: Temp: 98.3 F (36.8 C) (01/31 0255) Temp Source: Oral (01/31 0255) BP: 106/50 (01/31 0255) Pulse Rate: 72 (01/31 0255)  Labs: Recent Labs    07/10/21 0542 07/11/21 0518 07/12/21 0821 07/12/21 1847 07/12/21 2108 07/13/21 0207  HGB 12.5 12.2 11.8*  --   --  11.8*  HCT 40.7 39.6 38.3  --   --  38.1  PLT 98* 95* 90*  --   --  93*  APTT  --   --   --  98*  --   --   LABPROT  --   --   --  16.0*  --   --   INR  --   --   --  1.3*  --   --   HEPARINUNFRC  --   --   --   --   --  0.27*  CREATININE 0.87 0.93 0.85  --   --   --   TROPONINIHS  --   --   --  216* 156*  --      Estimated Creatinine Clearance: 60.2 mL/min (by C-G formula based on SCr of 0.85 mg/dL).   Medical History: Past Medical History:  Diagnosis Date   Achilles tendon rupture, right, initial encounter    Allergic rhinitis, cause unspecified    Anemia, unspecified    Anxiety state, unspecified    Atherosclerosis of aorta ([place])    TEE, June, 2010, grade 4 aortic arch atherosclerosis , Dr. Aundra Dubin   CAD (coronary artery disease)    DES and circumflex 2006 /  DES to LAD 2011   Carotid artery disease ([place])    Doppler, November, 2011, stable, 0-39% bilateral, turbulent flow left subclavian   Chronic diastolic heart failure ([place])    Cyst of thyroid    Diverticulosis of colon (without mention of hemorrhage)    Dysphasia    Possibly esophageal stricture   Esophageal reflux    Hiatal hernia    Hyperlipemia    Hypertension    Lumbago    Lumbar disc disease    MVP (mitral valve prolapse)    Neuropathy    Nonspecific abnormal finding in stool contents    Obesity, unspecified     Osteoarthrosis, unspecified whether generalized or localized, unspecified site    Osteopenia    Other diseases of lung, not elsewhere classified    Peripheral vascular disease, unspecified ([place])    Personal history of colonic polyps    ADENOMATOUS POLYP   PFO (patent foramen ovale)    Patient had a very small PFO by TEE 2010.  This was seen only by bubble analysis during Valsalva   PONV (postoperative nausea and vomiting)    Sleep apnea    uses CPAP nightly   Sleep related hypoventilation/hypoxemia in conditions classifiable elsewhere    Stroke [facility]) 11/2008   Treated with TPA? 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 Victorino Dike.   Unspecified cerebral artery occlusion with cerebral infarction    Unspecified venous (peripheral) insufficiency    Vitamin B  12 deficiency     Medications:  Scheduled:   amLODipine  5 mg Oral q morning   buPROPion  300 mg Oral Daily   diclofenac Sodium  2 g Topical QID   feeding supplement  1 Container Oral TID BM   gabapentin  600 mg Oral TID   isosorbide mononitrate  30 mg Oral QHS   metoprolol tartrate  50 mg Oral BID   pantoprazole  40 mg Oral BID   rosuvastatin  10 mg Oral QHS   Infusions:   ampicillin-sulbactam (UNASYN) IV 3 g (07/12/21 2226)   heparin 1,250 Units/hr (07/12/21 1835)   PRN: acetaminophen, lip balm, oxyCODONE  Assessment: 81 yo female admitted with abdominal pain, found to have diverticulitis.  On 1/30, developed hypoxia on ambulation and CTa showed PE with right heart strain.  Pharmacy consulted to dose IV heparin. Of note, has thrombocytopenia which is chronic, d/w MD, ok to proceed with heparin since no bleeding currently reported.  07/13/21 Heparin level = 0.27 (subtherapeutic) with heparin gtt @ 1250 units/hr Hgb = 11.8, PLTC 93k (low but stable) No complications of therapy noted  Goal of Therapy:  Heparin level  0.3-0.7 units/ml Monitor platelets by anticoagulation protocol: Yes   Plan:  Increase heparin gtt to 1400 units/hr Check heparin level 8 hr after heparin rate increased Follow daily CBC and heparin level Monitor for signs & symptoms of bleeding  Leone Haven, PharmD 07/13/2021,3:42 AM

## 2021-07-13 NOTE — Progress Notes (Addendum)
NAME:  Stephanie Alvarado, MRN:  [id number], DOB:  1940/11/08, LOS: 6 ADMISSION DATE:  07/06/2021, CONSULTATION DATE:  07/12/21 REFERRING MD:  Dr. Florene Glen, TRH, CHIEF COMPLAINT:  SOB   History of Present Illness:  81 y/o F who presented to [facility] ER on 1/24 with reports of severe abdominal pain.   Former smoker with a 40 pack year smoking history. Quit 2000  Work up for abdominal pain consistent with small bowel diverticulitis.  She was admitted for further care per TRH, treated with IV antibiotics.  Hospital course complicated by AKI, electrolyte disturbances including hypokalemia, thrombocytopenia and acute on chronic hypoxic respiratory failure.  She had improved and was  pended for potential discharge on 1/30 but was found to be short of breath and hypoxic on ambulation.   She also notes some chest tenderness with palpation on her L lower anterior chest.  No change in her work of breathing that she can feel.  She does note fatigue for the past month or so, especially with exertion.   No recent swelling or tenderness in the legs.   This was evaluated with CTA of the chest which showed extensive acute bilateral pulmonary emboli, with with RV/LV ratio of 0.91, localized airspace opacity in the RLL, small right pleural effusion.  Heparin bolus was initiated, with subsequent infusion.  BNP 121.  Troponin, LE doppler and ECHO pending.   PCCM consulted for consult for PE.  Pertinent  Medical History  CAD - s/p DES to circumflex 2006, LAD 2094  Chronic Diastolic CHF  HTN  Mitral Valve Prolapse  PVD  OSA - on CPAP for several years, has not really been using it lately.  On home oxygen intermittently, uses it when she feels short of breath; unclear diagnosis CVA - 2010, s/p tPA  Subclavian Artery Disease  Obesity  Vitamin B12 Deficiency  Osteoarthritis  Echo from 6/22: Right ventricular systolic function is normal. The right ventricular size is mild to moderately enlarged. There is normal pulmonary  artery systolic pressure. The estimated right ventricular systolic pressure is 70.9 mmHg.   Significant Hospital Events: Including procedures, antibiotic start and stop dates in addition to other pertinent events   1/24 Admit  1/30 PCCM consulted for evaluation of PE   Interim History / Subjective:  Pt. Remains on 4 L [place]. Sats 90% with last check.  She is in NAD at rest, but states with exertion she gets winded. She did note today is the first time she has been able to lay on her side. She states she wore CPAP all night last night and had issues with secretions .  Currently having heart echo done.  While home O2 is for sleep, patient uses it throughout the day whenever she gets short of breath.  T Max 99.6 Net + 1651  07/13/2021 VAS [place] Lower extremities Summary:  RIGHT:  - Findings consistent with acute deep vein thrombosis involving the right  posterior tibial veins.  - No cystic structure found in the popliteal fossa.     LEFT:  - There is no evidence of deep vein thrombosis in the lower extremity.  - No cystic structure found in the popliteal fossa.   Objective   Blood pressure (!) 110/58, pulse 68, temperature 97.6 F (36.4 C), temperature source Oral, resp. rate 17, height 5\' 8"  (1.727 m), weight 90.8 kg, SpO2 90 %.        Intake/Output Summary (Last 24 hours) at 07/13/2021 1129 Last data filed at 07/13/2021 1008  Gross per 24 hour  Intake 494.37 ml  Output 250 ml  Net 244.37 ml   Filed Weights   07/06/21 2142  Weight: 90.8 kg    Examination: General: Older female in bed having echo done , NAD, slight work of breathing on 4 L [place] , HENT: NCATm MM pink and moist, No LAD or JVD Lungs: Bilateral chest excursion , clear , diminished per bases Cardiovascular: S1, S2, RRR no mgr  Abdomen: soft, NT, ND, BS + Extremities: No obvious deformities noted, no edema no erythema  Neuro: Awake and alert and oriented, MAE x 4, A&O x 3, appropriate    Resolved Hospital Problem  list     Assessment & Plan:   Acute Bilateral Submassive Pulmonary Embolism  Acute deep vein thrombosis involving the right  posterior tibial veins. ( + Doppler 07/13/20)  sPESI score 3 (based on HFpEF grade 1 diastolic, age and hypoxia).  It seems she is not typically hypoxemic at rest (?).  Unclear why she qualifies for home oxygen chronically.  RV/LV ratio 0.91 which is the high end of normal.  Hemodynamically stable.  -continue heparin infusion per pharmacy  - CBC daily - Monitor for bleeding -Continue supplemental O2 as needed. For sats > 92%  -await ECHO, LE findings >> echo currently being done  -follow BNP, troponin ( Last troponin 1/30 >> 156 from 216 , Last BNP 121.8  -risk / benefits and indications for systemic lytics discussed with patient  -no current role / need for systemic lytics  -continue monitoring on progressive unit for telemetry and continuous O2 monitoring.    Localized airspace opacity in the right lower lobe potentially from pulmonary hemorrhage, pneumonia, or localized edema. Small right pleural effusion. Plan Continue Unasyn per Primary Trend WBC and fever  curve OOB to chair   Acute on Chronic Hypoxic Respiratory Failure  Undiagnosed chronic hypoxemia.  Baseline 3l O2 with sleep  OSA does not cause daytime or exertional hypoxia.   Also has a compensated resp acidosis for unclear reasons.   Should see pulmonary as outpatient and have work up.    OSA  -continue nocturnal CPAP & PRN daytime sleep      Best Practice (right click and "Reselect all SmartList Selections" daily)  Per primary  Labs   CBC: Recent Labs  Lab 07/07/21 0430 07/08/21 0433 07/09/21 0509 07/10/21 0542 07/11/21 0518 07/12/21 0821 07/13/21 0207  WBC 7.0 6.2 7.5 9.5 9.7 10.0 9.4  NEUTROABS 4.9 3.8 4.5 6.6 6.5  --   --   HGB 13.7 13.6 13.4 12.5 12.2 11.8* 11.8*  HCT 43.5 44.0 44.4 40.7 39.6 38.3 38.1  MCV 90.1 89.6 91.9 90.8 89.6 89.3 89.6  PLT 125* 139* 126* 98* 95*  90* 93*    Basic Metabolic Panel: Recent Labs  Lab 07/08/21 0433 07/08/21 0854 07/09/21 0509 07/10/21 0542 07/11/21 0518 07/12/21 0821 07/13/21 0817  NA 134*   < > 136 134* 132* 133* 135  K 5.2*   < > 4.8 4.3 3.9 4.1 3.7  CL 101   < > 101 100 97* 98 100  CO2 26   < > 30 29 30 28 29   GLUCOSE 95   < > 98 105* 109* 103* 99  BUN 10   < > 15 12 15 14 15   CREATININE 1.28*   < > 1.15* 0.87 0.93 0.85 0.86  CALCIUM 9.3   < > 9.6 9.2 9.5 9.2 9.3  MG 2.2  --  2.3 2.0  2.1  --   --   PHOS 3.8  --  3.5 2.7 3.0  --   --    < > = values in this interval not displayed.   GFR: Estimated Creatinine Clearance: 59.5 mL/min (by C-G formula based on SCr of 0.86 mg/dL). Recent Labs  Lab 07/07/21 0430 07/07/21 1726 07/08/21 0433 07/10/21 0542 07/11/21 0518 07/12/21 0821 07/12/21 1847 07/13/21 0207  WBC 7.0  --    < > 9.5 9.7 10.0  --  9.4  LATICACIDVEN 0.9 0.9  --   --   --   --  1.2  --    < > = values in this interval not displayed.    Liver Function Tests: Recent Labs  Lab 07/08/21 0433 07/09/21 0509 07/10/21 0542 07/11/21 0518 07/13/21 0817  AST 20 16 17  13* 16  ALT 14 15 12 11 11   ALKPHOS 58 56 52 51 47  BILITOT 0.8 0.8 1.0 0.9 0.6  PROT 6.4* 6.4* 5.9* 6.1* 5.9*  ALBUMIN 3.3* 3.3* 3.0* 2.9* 2.6*   Recent Labs  Lab 07/06/21 2139  LIPASE 25   No results for input(s): AMMONIA in the last 168 hours.  ABG    Component Value Date/Time   PHART 7.372 04/10/2017 1356   PCO2ART 49.1 (H) 04/10/2017 1356   PO2ART 96.0 04/10/2017 1356   HCO3 30.7 (H) 07/10/2021 1224   TCO2 30 04/10/2017 1356   O2SAT 74.5 07/10/2021 1224     Coagulation Profile: Recent Labs  Lab 07/12/21 1847  INR 1.3*    Cardiac Enzymes: No results for input(s): CKTOTAL, CKMB, CKMBINDEX, TROPONINI in the last 168 hours.  HbA1C: Hgb A1c MFr Bld  Date/Time Value Ref Range Status  05/31/2016 06:41 AM 5.8 (H) 4.8 - 5.6 % Final    Comment:    (NOTE)         Pre-diabetes: 5.7 - 6.4          Diabetes: >6.4         Glycemic control for adults with diabetes: <7.0   11/12/2008 05:20 AM  4.6 - 6.1 % Final   5.9 (NOTE) The ADA recommends the following therapeutic goal for glycemic control related to Hgb A1c measurement: Goal of therapy: <6.5 Hgb A1c  Reference: American Diabetes Association: Clinical Practice Recommendations 2010, Diabetes Care, 2010, 33: (Suppl  1).    CBG: Recent Labs  Lab 07/11/21 2335 07/12/21 0806 07/12/21 1556 07/13/21 0057 07/13/21 0830  GLUCAP 142* 104* 94 108* 92   Allergies Allergies  Allergen Reactions   Codeine Itching    REACTION: itch     Home Medications  Prior to Admission medications   Medication Sig Start Date End Date Taking? Authorizing Provider  amLODipine (NORVASC) 5 MG tablet Take 5 mg by mouth every morning. 05/17/21  Yes [provider]  amoxicillin-clavulanate (AUGMENTIN) 875-125 MG tablet Take 1 tablet by mouth 2 (two) times daily for 4 days. 07/12/21 07/16/21 Yes Elodia Florence., MD  buPROPion (WELLBUTRIN XL) 150 MG 24 hr tablet Take 300 mg by mouth daily. 10/31/15  Yes [provider]  docusate sodium (COLACE) 100 MG capsule Take 100 mg by mouth daily as needed for mild constipation.   Yes [provider]  furosemide (LASIX) 40 MG tablet Take 40 mg by mouth daily as needed for edema.   Yes [provider]  gabapentin (NEURONTIN) 300 MG capsule Take 600 mg by mouth 3 (three) times daily.  10/31/15  Yes [provider]  isosorbide mononitrate (IMDUR) 30 MG 24 hr tablet Take 30 mg by mouth at bedtime.    Yes [provider]  metoprolol tartrate (LOPRESSOR) 50 MG tablet Take 50 mg by mouth 2 (two) times daily.    Yes [provider]  Multiple Vitamin (MULTIVITAMIN) capsule Take 1 capsule by mouth daily.   Yes [provider]  mupirocin ointment (BACTROBAN) 2 % Apply 1 application topically daily. 06/24/21  Yes [provider]  pantoprazole  (PROTONIX) 40 MG tablet Take 40 mg by mouth 2 (two) times daily.   Yes [provider]  polyethylene glycol powder (GLYCOLAX/MIRALAX) powder Take 17 g by mouth daily as needed for moderate constipation.    Yes Sable Feil, MD  polyvinyl alcohol (LIQUIFILM TEARS) 1.4 % ophthalmic solution Place 1 drop into both eyes as needed for dry eyes.   Yes [provider]  rosuvastatin (CRESTOR) 10 MG tablet Take 10 mg by mouth at bedtime.   Yes [provider]  bisacodyl (DULCOLAX) 10 MG suppository Place 1 suppository (10 mg total) rectally daily as needed for moderate constipation. Patient not taking: Reported on 07/06/2021 12/25/19   Edmisten, Drue Dun L, PA  cephALEXin (KEFLEX) 500 MG capsule Take 1 capsule (500 mg total) by mouth 3 (three) times daily. Patient not taking: Reported on 07/06/2021 06/24/21   Landis Martins, DPM  linaclotide Rolan Lipa) 145 MCG CAPS capsule Take 1 capsule (145 mcg total) by mouth daily before breakfast. Patient not taking: Reported on 07/06/2021 06/25/20   Zehr, Laban Emperor, PA-C  nitroGLYCERIN (NITROSTAT) 0.4 MG SL tablet Place 0.4 mg under the tongue every 5 (five) minutes as needed for chest pain (TAKE UP TO 3 DOSES BEFORE CALLING 911). Patient not taking: Reported on 07/06/2021    [provider]  PFIZER COVID-19 [place] injection  04/01/21   [provider]     Critical care time: 35 minutes

## 2021-07-13 NOTE — Progress Notes (Signed)
PROGRESS NOTE    Stephanie Alvarado  QAS:[id number] DOB: 1941-02-04 DOA: 07/06/2021 PCP: Caryl Bis, MD  Chief Complaint  Patient presents with   Abdominal Pain    Brief Narrative:  Stephanie Alvarado is Stephanie Alvarado 81 y.o. female with history of CAD status post stenting, chronic diastolic CHF, hypertension, sleep apnea uses oxygen at home presents to the ER after patient started having severe abdominal pain involving the mid abdomen and right lower quadrant since 2 PM yesterday.  Pain is constant had some episodes of vomiting no blood in the vomitus denies any diarrhea fever or chills.  She's been found to have small bowel diverticulitis.  She'd improved on IV antibiotics.  Plan was for potential discharge on 1/30, but she was hypoxic with ambulation and CT PE showed PE with right heart strain.  She's been started on heparin.  Pulmonary was consulted due to concern for submassive PE.  Currently receiving anticoagulation, expect she'll need 2-3 days anticoagulation with heparin, then can consider transition to doac and discharge.    See below for additional details   Assessment & Plan:   Principal Problem:   Pulmonary embolism (HCC) Active Problems:   Diverticulitis   Opacity of lung on imaging study   AKI (acute kidney injury) ([place])   Chronic diastolic CHF (congestive heart failure) (HCC)   Chronic respiratory failure with hypoxia, on home oxygen therapy (HCC)   Hypokalemia   Essential hypertension   CAD (coronary artery disease)   Thrombocytopenia (HCC)   Daytime sleepiness   Right-sided chest pain   Thyroid nodule   Acute on chronic respiratory failure with hypoxia (HCC)  Assessment and Plan: * Pulmonary embolism (HCC) Provoked in setting of this hospitalization With evidence of right heart strain on CT Echo with normal RVSF and size, EF 79-89%, grade 1 diastolic dysfunction LE [place] with DVT involving right posterior tibial veins BNP mildly elevated Follow troponins, elevated, but  downtrending - likely in setting of PE PESI class 3-4 (uses O2 as needed at home, but appears generally this is just for OSA) S/p 24 hrs bed rest around 6 PM today, d/c bed rest after this Given high risk pesi, will discuss with PCCM - no plans for lytics at this point Lytics if decompensation    Opacity of lung on imaging study RLL airspace opacity - due to hemorrhage, pneumonia, edema ? pulm infarct She's already on abx, follow  Diverticulitis- (present on admission) CT with small bowel diverticulitis Continue abx -> narrow to unasyn (plan for 10 days abx) Appreciate surgery assistance Advance to soft diet per surgery Mobilize This has improved   AKI (acute kidney injury) ([place]) AKI is improving with IVF Follow UA (bland) and urine studies Possibly contrast related? [place] without hydro Continue IVF for now Improved  Hypokalemia follow  Chronic respiratory failure with hypoxia, on home oxygen therapy (HCC) On oxygen at night for sleep apnea Says she uses as needed generally   Chronic diastolic CHF (congestive heart failure) (HCC) Appears euvolemic, follow volume status closely  Daytime sleepiness VBG with respiratory acidosis, but compensated with normal pH  Will order nightly CPAP (she uses O2 at night for OSA)  Thrombocytopenia (HCC) Chronic, follow  Relatively stable in 90's over past few days, follow Watch closely with need for anticoagulation given PE  CAD (coronary artery disease)- (present on admission) No sx ACS  Essential hypertension- (present on admission) Amlodipine, imdur, metoprolol  Right-sided chest pain Musculoskeletal pain - tender to palpation Not c/w ACS  Will add voltaren  2 view CXR without active disease ? Related to PE - localized airspace opacity in RLL - but she's had chest pain for Stephanie Alvarado few weeks and it's TTP which doesn't really fit  Thyroid nodule Noted on CT scan, "appears to have been bx on 04/25/2012" Follow outpatient   DVT  prophylaxis: SCD Code Status: full Family Communication: none - sig other at bedside, called son, no answer Disposition:   Status is: Inpatient  Remains inpatient appropriate because: need for IV abx, surgery c/s       Consultants:  surgery  Procedures:  none  Antimicrobials:  Anti-infectives (From admission, onward)    Start     Dose/Rate Route Frequency Ordered Stop   07/12/21 2200  Ampicillin-Sulbactam (UNASYN) 3 g in sodium chloride 0.9 % 100 mL IVPB        3 g 200 mL/hr over 30 Minutes Intravenous Every 6 hours 07/12/21 1936     07/12/21 0000  amoxicillin-clavulanate (AUGMENTIN) 875-125 MG tablet        1 tablet Oral 2 times daily 07/12/21 1246 07/16/21 2359   07/07/21 1200  piperacillin-tazobactam (ZOSYN) IVPB 3.375 g  Status:  Discontinued        3.375 g 12.5 mL/hr over 240 Minutes Intravenous Every 8 hours 07/07/21 0439 07/12/21 1924   07/07/21 0415  piperacillin-tazobactam (ZOSYN) IVPB 3.375 g        3.375 g 100 mL/hr over 30 Minutes Intravenous  Once 07/07/21 0413 07/07/21 0505   07/07/21 0000  amoxicillin-clavulanate (AUGMENTIN) 875-125 MG per tablet 1 tablet        1 tablet Oral  Once 07/06/21 2353 07/07/21 0003       Subjective: No new complaints  Objective: Vitals:   07/13/21 0255 07/13/21 0648 07/13/21 0905 07/13/21 1307  BP: (!) 106/50 (!) 108/50 (!) 110/58 (!) 124/58  Pulse: 72 68 68 70  Resp: 19 17  17   Temp: 98.3 F (36.8 C) 97.6 F (36.4 C)  98.6 F (37 C)  TempSrc: Oral Oral  Oral  SpO2: 90% 90%  90%  Weight:      Height:        Intake/Output Summary (Last 24 hours) at 07/13/2021 1735 Last data filed at 07/13/2021 1556 Gross per 24 hour  Intake 1024.15 ml  Output 250 ml  Net 774.15 ml    Filed Weights   07/06/21 2142  Weight: 90.8 kg    Examination:  General: No acute distress. Cardiovascular: RRR Lungs: unlabored Abdomen: Soft, nontender, nondistended  Neurological: Alert and oriented 3. Moves all extremities 4.  Cranial nerves II through XII grossly intact. Skin: Warm and dry. No rashes or lesions. Extremities: No clubbing or cyanosis. No edema.   Data Reviewed: I have personally reviewed following labs and imaging studies  CBC: Recent Labs  Lab 07/07/21 0430 07/08/21 0433 07/09/21 0509 07/10/21 0542 07/11/21 0518 07/12/21 0821 07/13/21 0207  WBC 7.0 6.2 7.5 9.5 9.7 10.0 9.4  NEUTROABS 4.9 3.8 4.5 6.6 6.5  --   --   HGB 13.7 13.6 13.4 12.5 12.2 11.8* 11.8*  HCT 43.5 44.0 44.4 40.7 39.6 38.3 38.1  MCV 90.1 89.6 91.9 90.8 89.6 89.3 89.6  PLT 125* 139* 126* 98* 95* 90* 93*     Basic Metabolic Panel: Recent Labs  Lab 07/08/21 0433 07/08/21 0854 07/09/21 0509 07/10/21 0542 07/11/21 0518 07/12/21 0821 07/13/21 0817  NA 134*   < > 136 134* 132* 133* 135  K 5.2*   < >  4.8 4.3 3.9 4.1 3.7  CL 101   < > 101 100 97* 98 100  CO2 26   < > 30 29 30 28 29   GLUCOSE 95   < > 98 105* 109* 103* 99  BUN 10   < > 15 12 15 14 15   CREATININE 1.28*   < > 1.15* 0.87 0.93 0.85 0.86  CALCIUM 9.3   < > 9.6 9.2 9.5 9.2 9.3  MG 2.2  --  2.3 2.0 2.1  --   --   PHOS 3.8  --  3.5 2.7 3.0  --   --    < > = values in this interval not displayed.     GFR: Estimated Creatinine Clearance: 59.5 mL/min (by C-G formula based on SCr of 0.86 mg/dL).  Liver Function Tests: Recent Labs  Lab 07/08/21 0433 07/09/21 0509 07/10/21 0542 07/11/21 0518 07/13/21 0817  AST 20 16 17  13* 16  ALT 14 15 12 11 11   ALKPHOS 58 56 52 51 47  BILITOT 0.8 0.8 1.0 0.9 0.6  PROT 6.4* 6.4* 5.9* 6.1* 5.9*  ALBUMIN 3.3* 3.3* 3.0* 2.9* 2.6*     CBG: Recent Labs  Lab 07/12/21 0806 07/12/21 1556 07/13/21 0057 07/13/21 0830 07/13/21 1634  GLUCAP 104* 94 108* 92 130*      Recent Results (from the past 240 hour(s))  Resp Panel by RT-PCR (Flu Teka Chanda&B, Covid) Nasopharyngeal Swab     Status: None   Collection Time: 07/07/21  2:10 AM   Specimen: Nasopharyngeal Swab; Nasopharyngeal(NP) swabs in vial transport medium   Result Value Ref Range Status   SARS Coronavirus 2 by RT PCR NEGATIVE NEGATIVE Final    Comment: (NOTE) SARS-CoV-2 target nucleic acids are NOT DETECTED.  The SARS-CoV-2 RNA is generally detectable in upper respiratory specimens during the acute phase of infection. The lowest concentration of SARS-CoV-2 viral copies this assay can detect is 138 copies/mL. Stalin Gruenberg negative result does not preclude SARS-Cov-2 infection and should not be used as the sole basis for treatment or other patient management decisions. Asah Lamay negative result may occur with  improper specimen collection/handling, submission of specimen other than nasopharyngeal swab, presence of viral mutation(s) within the areas targeted by this assay, and inadequate number of viral copies(<138 copies/mL). Carolan Avedisian negative result must be combined with clinical observations, patient history, and epidemiological information. The expected result is Negative.  Fact Sheet for Patients:  EntrepreneurPulse.com.au  Fact Sheet for Healthcare Providers:  IncredibleEmployment.be  This test is no t yet approved or cleared by the [place] FDA and  has been authorized for detection and/or diagnosis of SARS-CoV-2 by FDA under an Emergency Use Authorization (EUA). This EUA will remain  in effect (meaning this test can be used) for the duration of the COVID-19 declaration under Section 564(b)(1) of the Act, 21 U.S.C.section 360bbb-3(b)(1), unless the authorization is terminated  or revoked sooner.       Influenza Marshae Azam by PCR NEGATIVE NEGATIVE Final   Influenza B by PCR NEGATIVE NEGATIVE Final    Comment: (NOTE) The Xpert Xpress SARS-CoV-2/FLU/RSV plus assay is intended as an aid in the diagnosis of influenza from Nasopharyngeal swab specimens and should not be used as Jovanna Hodges sole basis for treatment. Nasal washings and aspirates are unacceptable for Xpert Xpress SARS-CoV-2/FLU/RSV testing.  Fact Sheet for  Patients: EntrepreneurPulse.com.au  Fact Sheet for Healthcare Providers: IncredibleEmployment.be  This test is not yet approved or cleared by the [place] FDA and has been authorized for detection and/or  diagnosis of SARS-CoV-2 by FDA under an Emergency Use Authorization (EUA). This EUA will remain in effect (meaning this test can be used) for the duration of the COVID-19 declaration under Section 564(b)(1) of the Act, 21 U.S.C. section 360bbb-3(b)(1), unless the authorization is terminated or revoked.  Performed at [facility], [place] [address] [place], [place] [address]           Radiology Studies: CT Angio Chest Pulmonary Embolism (PE) W or WO Contrast  Result Date: 07/12/2021 CLINICAL DATA:  Chest pain/shortness breath. EXAM: CT ANGIOGRAPHY CHEST WITH CONTRAST TECHNIQUE: Multidetector CT imaging of the chest was performed using the standard protocol during bolus administration of intravenous contrast. Multiplanar CT image reconstructions and MIPs were obtained to evaluate the vascular anatomy. RADIATION DOSE REDUCTION: This exam was performed according to the departmental dose-optimization program which includes automated exposure control, adjustment of the mA and/or kV according to patient size and/or use of iterative reconstruction technique. CONTRAST:  5mL OMNIPAQUE IOHEXOL 350 MG/ML SOLN COMPARISON:  Chest radiograph 07/11/2021 and chest CT 05/31/2016 FINDINGS: Cardiovascular: There is substantial acute bilateral pulmonary embolus in lobar branches of the right upper lobe, right middle lobe, left upper lobe, and left lower lobe with bridging thrombus in the left main pulmonary artery and in the right pulmonary artery. Clot burden is high. Right ventricular to left ventricular ratio 0.91. Coronary, aortic arch, and branch vessel atherosclerotic vascular disease. Mediastinum/Nodes: Hypodense right thyroid lesion up to 4.2 cm  in long axis on image 16 series 5, previously about 3.4 cm. It appears that this nodule was probably biopsied on 04/25/2012. Small type 1 hernia. Lungs/Pleura: There is some patchy indistinct airspace opacity in the right lower lobe potentially from pulmonary hemorrhage, pneumonia, or localized edema. Small right pleural effusion. Upper Abdomen: Atherosclerosis of the abdominal aorta and its branches. Musculoskeletal: Thoracic kyphosis and spondylosis. Suspected lipoma in the right proximal upper arm on image 5 series 4. Review of the MIP images confirms the above findings. IMPRESSION: 1. Extensive bilateral pulmonary embolus. Clot burden is high. Positive for acute PE with CT evidence of right heart strain (RV/LV Ratio = 0.91) consistent with at least submassive (intermediate risk) PE. The presence of right heart strain has been associated with an increased risk of morbidity and mortality. Please refer to the "PE Focused" order set in EPIC. 2. Hypodense right thyroid nodule, this appears to of been biopsied on 04/25/2012. 3. Small type 1 hiatal hernia. 4. Localized airspace opacity in the right lower lobe potentially from pulmonary hemorrhage, pneumonia, or localized edema. Small right pleural effusion. 5. Aortic Atherosclerosis (ICD10-I70.0). Coronary and systemic atherosclerosis. Critical Value/emergent results were called by telephone at the time of interpretation on 07/12/2021 at 5:48 pm to provider Dr. Florene Glen , who verbally acknowledged these results. Electronically Signed   By: Van Clines M.D.   On: 07/12/2021 17:54   ECHOCARDIOGRAM COMPLETE  Result Date: 07/13/2021    ECHOCARDIOGRAM REPORT   Patient Name:   Stephanie Alvarado Date of Exam: 07/13/2021 Medical Rec #:  [id number]     Height:       68.0 in Accession #:    [id number]    Weight:       200.2 lb Date of Birth:  09-29-1940     BSA:          2.045 m Patient Age:    43 years      BP:           110/58 mmHg Patient  Gender: F             HR:            71 bpm. Exam Location:  Inpatient Procedure: 2D Echo, Cardiac Doppler and Color Doppler Indications:    pulm. embolus  History:        Patient has prior history of Echocardiogram examinations, most                 recent 11/16/2020. CAD; Risk Factors:Hypertension and                 Dyslipidemia. Mvp/ aov sclrosis.  Sonographer:    Beryle Beams Referring Phys: [phone] Christan Ciccarelli CALDWELL POWELL JR IMPRESSIONS  1. Left ventricular ejection fraction, by estimation, is 60 to 65%. The left ventricle has normal function. The left ventricle has no regional wall motion abnormalities. Left ventricular diastolic parameters are consistent with Grade I diastolic dysfunction (impaired relaxation).  2. Right ventricular systolic function is normal. The right ventricular size is normal.  3. The mitral valve is normal in structure. Mild mitral valve regurgitation. No evidence of mitral stenosis.  4. The aortic valve is tricuspid. There is mild calcification of the aortic valve. Aortic valve regurgitation is not visualized. Aortic valve sclerosis is present, with no evidence of aortic valve stenosis.  5. The inferior vena cava is normal in size with greater than 50% respiratory variability, suggesting right atrial pressure of 3 mmHg. FINDINGS  Left Ventricle: Left ventricular ejection fraction, by estimation, is 60 to 65%. The left ventricle has normal function. The left ventricle has no regional wall motion abnormalities. The left ventricular internal cavity size was normal in size. There is  no left ventricular hypertrophy. Left ventricular diastolic parameters are consistent with Grade I diastolic dysfunction (impaired relaxation). Right Ventricle: The right ventricular size is normal. No increase in right ventricular wall thickness. Right ventricular systolic function is normal. Left Atrium: Left atrial size was normal in size. Right Atrium: Right atrial size was normal in size. Pericardium: There is no evidence of pericardial effusion.  Mitral Valve: The mitral valve is normal in structure. Mild mitral valve regurgitation. No evidence of mitral valve stenosis. Tricuspid Valve: The tricuspid valve is normal in structure. Tricuspid valve regurgitation is not demonstrated. No evidence of tricuspid stenosis. Aortic Valve: The aortic valve is tricuspid. There is mild calcification of the aortic valve. Aortic valve regurgitation is not visualized. Aortic valve sclerosis is present, with no evidence of aortic valve stenosis. Aortic valve mean gradient measures 3.0 mmHg. Aortic valve peak gradient measures 6.4 mmHg. Aortic valve area, by VTI measures 2.76 cm. Pulmonic Valve: The pulmonic valve was normal in structure. Pulmonic valve regurgitation is not visualized. No evidence of pulmonic stenosis. Aorta: The aortic root is normal in size and structure. Venous: The inferior vena cava is normal in size with greater than 50% respiratory variability, suggesting right atrial pressure of 3 mmHg. IAS/Shunts: No atrial level shunt detected by color flow Doppler.  LEFT VENTRICLE PLAX 2D LVIDd:         4.80 cm     Diastology LVIDs:         3.30 cm     LV e' medial:    6.64 cm/s LV PW:         1.00 cm     LV E/e' medial:  11.2 LV IVS:        0.80 cm     LV e' lateral:   8.27 cm/s LVOT diam:  2.10 cm     LV E/e' lateral: 9.0 LV SV:         72 LV SV Index:   35 LVOT Area:     3.46 cm  LV Volumes (MOD) LV vol d, MOD A2C: 58.1 ml LV vol d, MOD A4C: 71.0 ml LV vol s, MOD A2C: 20.1 ml LV vol s, MOD A4C: 27.4 ml LV SV MOD A2C:     38.0 ml LV SV MOD A4C:     71.0 ml LV SV MOD BP:      40.6 ml RIGHT VENTRICLE             IVC RV S prime:     13.10 cm/s  IVC diam: 1.60 cm TAPSE (M-mode): 2.2 cm LEFT ATRIUM             Index        RIGHT ATRIUM           Index LA diam:        4.00 cm 1.96 cm/m   RA Area:     16.50 cm LA Vol (A2C):   54.6 ml 26.71 ml/m  RA Volume:   44.80 ml  21.91 ml/m LA Vol (A4C):   54.2 ml 26.51 ml/m LA Biplane Vol: 54.1 ml 26.46 ml/m  AORTIC  VALVE                    PULMONIC VALVE AV Area (Vmax):    3.02 cm     PV Vmax:       0.64 m/s AV Area (Vmean):   2.68 cm     PV Vmean:      42.900 cm/s AV Area (VTI):     2.76 cm     PV VTI:        0.115 m AV Vmax:           126.00 cm/s  PV Peak grad:  1.6 mmHg AV Vmean:          85.500 cm/s  PV Mean grad:  1.0 mmHg AV VTI:            0.262 m AV Peak Grad:      6.4 mmHg AV Mean Grad:      3.0 mmHg LVOT Vmax:         110.00 cm/s LVOT Vmean:        66.200 cm/s LVOT VTI:          0.209 m LVOT/AV VTI ratio: 0.80  AORTA Ao Root diam: 2.80 cm Ao Asc diam:  2.60 cm MITRAL VALVE               TRICUSPID VALVE MV Area (PHT): 4.17 cm    TV Peak grad:   39.8 mmHg MV Decel Time: 182 msec    TV Mean grad:   26.0 mmHg MV E velocity: 74.60 cm/s  TV Vmax:        3.16 m/s MV Kayia Billinger velocity: 93.00 cm/s  TV Vmean:       240.0 cm/s MV E/Raynee Mccasland ratio:  0.80        TV VTI:         1.05 msec                             SHUNTS  Systemic VTI:  0.21 m                            Systemic Diam: 2.10 cm Candee Furbish MD Electronically signed by Candee Furbish MD Signature Date/Time: 07/13/2021/1:50:05 PM    Final    VAS [place] LOWER EXTREMITY VENOUS (DVT)  Result Date: 07/13/2021  Lower Venous DVT Study Patient Name:  Stephanie Alvarado  Date of Exam:   07/13/2021 Medical Rec #: [id number]      Accession #:    [id number] Date of Birth: [birth year]      Patient Gender: F Patient Age:   23 years Exam Location:  [facility] Procedure:      VAS [place] LOWER EXTREMITY VENOUS (DVT) Referring Phys: Jesten Cappuccio POWELL JR --------------------------------------------------------------------------------  Indications: Pulmonary embolism.  Risk Factors: Confirmed PE. Anticoagulation: Heparin. Limitations: Poor ultrasound/tissue interface. Comparison Study: No prior studies. Performing Technologist: Oliver Hum RVT  Examination Guidelines: Devion Chriscoe complete evaluation includes B-mode imaging, spectral Doppler, color Doppler, and power Doppler as needed of all  accessible portions of each vessel. Bilateral testing is considered an integral part of Peace Noyes complete examination. Limited examinations for reoccurring indications may be performed as noted. The reflux portion of the exam is performed with the patient in reverse Trendelenburg.  +---------+---------------+---------+-----------+----------+--------------+  RIGHT     Compressibility Phasicity Spontaneity Properties Thrombus Aging  +---------+---------------+---------+-----------+----------+--------------+  CFV       Full            Yes       Yes                                    +---------+---------------+---------+-----------+----------+--------------+  SFJ       Full                                                             +---------+---------------+---------+-----------+----------+--------------+  FV Prox   Full                                                             +---------+---------------+---------+-----------+----------+--------------+  FV Mid    Full                                                             +---------+---------------+---------+-----------+----------+--------------+  FV Distal Full                                                             +---------+---------------+---------+-----------+----------+--------------+  PFV       Full                                                             +---------+---------------+---------+-----------+----------+--------------+  POP       Full            Yes       Yes                                    +---------+---------------+---------+-----------+----------+--------------+  PTV       Partial                                          Acute           +---------+---------------+---------+-----------+----------+--------------+  PERO      Full                                                             +---------+---------------+---------+-----------+----------+--------------+   +---------+---------------+---------+-----------+----------+--------------+   LEFT      Compressibility Phasicity Spontaneity Properties Thrombus Aging  +---------+---------------+---------+-----------+----------+--------------+  CFV       Full            Yes       Yes                                    +---------+---------------+---------+-----------+----------+--------------+  SFJ       Full                                                             +---------+---------------+---------+-----------+----------+--------------+  FV Prox   Full                                                             +---------+---------------+---------+-----------+----------+--------------+  FV Mid    Full                                                             +---------+---------------+---------+-----------+----------+--------------+  FV Distal Full                                                             +---------+---------------+---------+-----------+----------+--------------+  PFV       Full                                                             +---------+---------------+---------+-----------+----------+--------------+  POP       Full            Yes       Yes                                    +---------+---------------+---------+-----------+----------+--------------+  PTV       Full                                                             +---------+---------------+---------+-----------+----------+--------------+  PERO      Full                                                             +---------+---------------+---------+-----------+----------+--------------+     Summary: RIGHT: - Findings consistent with acute deep vein thrombosis involving the right posterior tibial veins. - No cystic structure found in the popliteal fossa.  LEFT: - There is no evidence of deep vein thrombosis in the lower extremity.  - No cystic structure found in the popliteal fossa.  *See table(s) above for measurements and observations. Electronically signed by Deitra Mayo MD on 07/13/2021 at 1:10:09  PM.    Final         Scheduled Meds:  amLODipine  5 mg Oral q morning   buPROPion  300 mg Oral Daily   diclofenac Sodium  2 g Topical QID   feeding supplement  1 Container Oral TID BM   gabapentin  600 mg Oral TID   isosorbide mononitrate  30 mg Oral QHS   metoprolol tartrate  50 mg Oral BID   pantoprazole  40 mg Oral BID   rosuvastatin  10 mg Oral QHS   Continuous Infusions:  ampicillin-sulbactam (UNASYN) IV 3 g (07/13/21 1539)   heparin 1,400 Units/hr (07/13/21 1008)     LOS: 6 days    Time spent: over 30 min   Fayrene Helper, MD [facility]   To contact the attending provider between 7A-7P or the covering provider during after hours 7P-7A, please log into the web site [email or web address] and access using universal [place] password for that web site. If you do not have the password, please call the hospital operator.  07/13/2021, 5:35 PM

## 2021-07-13 NOTE — Progress Notes (Addendum)
Pt refused cpap tonight.  Pt was encouraged to call should she change her mind. ?

## 2021-07-13 NOTE — Progress Notes (Signed)
Bilateral lower extremity venous duplex has been completed. Preliminary results can be found in CV Proc through chart review.  Results were given to the patient's nurse, Anderson Malta.  07/13/21 9:55 AM Carlos Levering RVT

## 2021-07-13 NOTE — Progress Notes (Addendum)
Subjective: CC: Patient found to have bilateral PE's overnight. Now on heparin gtt Doing well from an abdominal standpoint. Denies any abdominal pain currently. Not much of an appetite but seems to be doing well with what she is eating from the soft diet tray without n/v. Does not feel bloated or distended. Passing flatus with last episode this am. Had an episode of diarrhea yesterday.   Objective: Vital signs in last 24 hours: Temp:  [97.6 F (36.4 C)-99.6 F (37.6 C)] 97.6 F (36.4 C) (01/31 0648) Pulse Rate:  [68-85] 68 (01/31 0905) Resp:  [17-20] 17 (01/31 0648) BP: (106-139)/(50-65) 110/58 (01/31 0905) SpO2:  [80 %-93 %] 90 % (01/31 0648) Last BM Date: 07/11/21  Intake/Output from previous day: 01/30 0701 - 01/31 0700 In: 374.4 [I.V.:174.4; IV Piggyback:200] Out: -  Intake/Output this shift: No intake/output data recorded.  PE: Gen:  Alert, NAD, pleasant Abd: soft, ND, NT, +BS  Lab Results:  Recent Labs    07/12/21 0821 07/13/21 0207  WBC 10.0 9.4  HGB 11.8* 11.8*  HCT 38.3 38.1  PLT 90* 93*   BMET Recent Labs    07/12/21 0821 07/13/21 0817  NA 133* 135  K 4.1 3.7  CL 98 100  CO2 28 29  GLUCOSE 103* 99  BUN 14 15  CREATININE 0.85 0.86  CALCIUM 9.2 9.3   PT/INR Recent Labs    07/12/21 1847  LABPROT 16.0*  INR 1.3*   CMP     Component Value Date/Time   NA 135 07/13/2021 0817   NA 143 03/07/2019 1534   K 3.7 07/13/2021 0817   CL 100 07/13/2021 0817   CO2 29 07/13/2021 0817   GLUCOSE 99 07/13/2021 0817   BUN 15 07/13/2021 0817   BUN 13 03/07/2019 1534   CREATININE 0.86 07/13/2021 0817   CALCIUM 9.3 07/13/2021 0817   PROT 5.9 (L) 07/13/2021 0817   ALBUMIN 2.6 (L) 07/13/2021 0817   AST 16 07/13/2021 0817   ALT 11 07/13/2021 0817   ALKPHOS 47 07/13/2021 0817   BILITOT 0.6 07/13/2021 0817   GFRNONAA >60 07/13/2021 0817   GFRAA >60 12/25/2019 0313   Lipase     Component Value Date/Time   LIPASE 25 07/06/2021 2139     Studies/Results: DG Chest 2 View  Result Date: 07/11/2021 CLINICAL DATA:  Chest pain. EXAM: CHEST - 2 VIEW COMPARISON:  July 08, 2021. FINDINGS: Stable cardiomediastinal silhouette. Both lungs are clear. The visualized skeletal structures are unremarkable. IMPRESSION: No active cardiopulmonary disease. Aortic Atherosclerosis (ICD10-I70.0). Electronically Signed   By: Marijo Conception M.D.   On: 07/11/2021 16:25   CT Angio Chest Pulmonary Embolism (PE) W or WO Contrast  Result Date: 07/12/2021 CLINICAL DATA:  Chest pain/shortness breath. EXAM: CT ANGIOGRAPHY CHEST WITH CONTRAST TECHNIQUE: Multidetector CT imaging of the chest was performed using the standard protocol during bolus administration of intravenous contrast. Multiplanar CT image reconstructions and MIPs were obtained to evaluate the vascular anatomy. RADIATION DOSE REDUCTION: This exam was performed according to the departmental dose-optimization program which includes automated exposure control, adjustment of the mA and/or kV according to patient size and/or use of iterative reconstruction technique. CONTRAST:  71mL OMNIPAQUE IOHEXOL 350 MG/ML SOLN COMPARISON:  Chest radiograph 07/11/2021 and chest CT 05/31/2016 FINDINGS: Cardiovascular: There is substantial acute bilateral pulmonary embolus in lobar branches of the right upper lobe, right middle lobe, left upper lobe, and left lower lobe with bridging thrombus in the left main pulmonary artery  and in the right pulmonary artery. Clot burden is high. Right ventricular to left ventricular ratio 0.91. Coronary, aortic arch, and branch vessel atherosclerotic vascular disease. Mediastinum/Nodes: Hypodense right thyroid lesion up to 4.2 cm in long axis on image 16 series 5, previously about 3.4 cm. It appears that this nodule was probably biopsied on 04/25/2012. Small type 1 hernia. Lungs/Pleura: There is some patchy indistinct airspace opacity in the right lower lobe potentially from pulmonary  hemorrhage, pneumonia, or localized edema. Small right pleural effusion. Upper Abdomen: Atherosclerosis of the abdominal aorta and its branches. Musculoskeletal: Thoracic kyphosis and spondylosis. Suspected lipoma in the right proximal upper arm on image 5 series 4. Review of the MIP images confirms the above findings. IMPRESSION: 1. Extensive bilateral pulmonary embolus. Clot burden is high. Positive for acute PE with CT evidence of right heart strain (RV/LV Ratio = 0.91) consistent with at least submassive (intermediate risk) PE. The presence of right heart strain has been associated with an increased risk of morbidity and mortality. Please refer to the "PE Focused" order set in EPIC. 2. Hypodense right thyroid nodule, this appears to of been biopsied on 04/25/2012. 3. Small type 1 hiatal hernia. 4. Localized airspace opacity in the right lower lobe potentially from pulmonary hemorrhage, pneumonia, or localized edema. Small right pleural effusion. 5. Aortic Atherosclerosis (ICD10-I70.0). Coronary and systemic atherosclerosis. Critical Value/emergent results were called by telephone at the time of interpretation on 07/12/2021 at 5:48 pm to provider Dr. Florene Glen , who verbally acknowledged these results. Electronically Signed   By: Van Clines M.D.   On: 07/12/2021 17:54    Anti-infectives: Anti-infectives (From admission, onward)    Start     Dose/Rate Route Frequency Ordered Stop   07/12/21 2200  Ampicillin-Sulbactam (UNASYN) 3 g in sodium chloride 0.9 % 100 mL IVPB        3 g 200 mL/hr over 30 Minutes Intravenous Every 6 hours 07/12/21 1936     07/12/21 0000  amoxicillin-clavulanate (AUGMENTIN) 875-125 MG tablet        1 tablet Oral 2 times daily 07/12/21 1246 07/16/21 2359   07/07/21 1200  piperacillin-tazobactam (ZOSYN) IVPB 3.375 g  Status:  Discontinued        3.375 g 12.5 mL/hr over 240 Minutes Intravenous Every 8 hours 07/07/21 0439 07/12/21 1924   07/07/21 0415  piperacillin-tazobactam  (ZOSYN) IVPB 3.375 g        3.375 g 100 mL/hr over 30 Minutes Intravenous  Once 07/07/21 0413 07/07/21 0505   07/07/21 0000  amoxicillin-clavulanate (AUGMENTIN) 875-125 MG per tablet 1 tablet        1 tablet Oral  Once 07/06/21 2353 07/07/21 0003        Assessment/Plan Small bowel diverticulitis - CT scan 1/24 showed diverticulitis of the small bowel in the mid abdomen with no diverticular abscess or perforation - WBC wnl yesterday. Afebrile. Pain resolved. NT on exam. Tolerating soft diet and having bowel function. Patient in the hospital for new finding of b/l PE's. We will sign off as it seems her abdomen has improved. Please call back with any questions or concerns. Will reach out to [facility] to let them know.    FEN - Soft VTE - SCDs, heparin gtt ID - Zosyn 1/25 - 1/30. Unasyn 1/30 >> Would recommend 7-10d course of abx   PE CAD status post stenting Chronic diastolic CHF HTN HLD Sleep apnea, uses home oxygen 2-3L PRN  This care required straightforward level of medical decision making.  LOS: 6 days    Jillyn Ledger , [facility] 07/13/2021, 9:32 AM Please see Amion for pager number during day hours 7:00am-4:30pm

## 2021-07-13 NOTE — Assessment & Plan Note (Signed)
RLL airspace opacity - due to hemorrhage, pneumonia, edema ? pulm infarct She's already on abx, follow

## 2021-07-13 NOTE — TOC Benefit Eligibility Note (Signed)
Transition of Care [facility]) Benefit Eligibility Note    Patient Details  Name: Stephanie Alvarado MRN: [id number] Date of Birth: [birth year]   Medication/Dose: eliquis/apixaban 60m po qd x30days;xarelto/rivaroxaban 240mpo qd x30days dx;PE  Covered?: Yes  Tier: 3 Drug  Prescription Coverage Preferred Pharmacy: local  Spoke with Person/Company/Phone Number:: [employer] [phone] [id number]  Co-Pay: $45.00 for both Medications  Prior Approval: No  Deductible: Met       FuKerin Salenhone Number: 07/13/2021, 9:47 AM

## 2021-07-13 NOTE — Progress Notes (Signed)
PT Cancellation Note  Patient Details Name: Stephanie Alvarado MRN: [id number] DOB: 1940-07-31   Cancelled Treatment:    Reason Eval/Treat Not Completed: Medical issues which prohibited therapy  Pt found to have new DVT, will await for anticoagulation per PT guidelines.  Will f/u later date. Abran Richard, PT Acute Rehab Services Pager [phone] [facility] [place] 07/13/2021, 11:41 AM

## 2021-07-13 NOTE — Assessment & Plan Note (Signed)
Noted on CT scan, "appears to have been bx on 04/25/2012" Follow outpatient

## 2021-07-13 NOTE — Progress Notes (Addendum)
ANTICOAGULATION CONSULT NOTE - Initial Consult  Pharmacy Consult for Heparin Indication: pulmonary embolus/DVT  Allergies  Allergen Reactions   Codeine Itching    REACTION: itch    Patient Measurements: Height: 5\' 8"  (172.7 cm) Weight: 90.8 kg (200 lb 2.8 oz) IBW/kg (Calculated) : 63.9 Heparin Dosing Weight: 83.2 kg  Vital Signs: Temp: 98.6 F (37 C) (01/31 1307) Temp Source: Oral (01/31 1307) BP: 124/58 (01/31 1307) Pulse Rate: 70 (01/31 1307)  Labs: Recent Labs    07/11/21 0518 07/12/21 2025 07/12/21 1847 07/12/21 2108 07/13/21 0207 07/13/21 0817 07/13/21 1223  HGB 12.2 11.8*  --   --  11.8*  --   --   HCT 39.6 38.3  --   --  38.1  --   --   PLT 95* 90*  --   --  93*  --   --   APTT  --   --  98*  --   --   --   --   LABPROT  --   --  16.0*  --   --   --   --   INR  --   --  1.3*  --   --   --   --   HEPARINUNFRC  --   --   --   --  0.27*  --  0.35  CREATININE 0.93 0.85  --   --   --  0.86  --   TROPONINIHS  --   --  216* 156*  --   --   --      Estimated Creatinine Clearance: 59.5 mL/min (by C-G formula based on SCr of 0.86 mg/dL).   Medical History: Past Medical History:  Diagnosis Date   Achilles tendon rupture, right, initial encounter    Allergic rhinitis, cause unspecified    Anemia, unspecified    Anxiety state, unspecified    Atherosclerosis of aorta ([place])    TEE, June, 2010, grade 4 aortic arch atherosclerosis , Dr. Aundra Dubin   CAD (coronary artery disease)    DES and circumflex 2006 /  DES to LAD 2011   Carotid artery disease ([place])    Doppler, November, 2011, stable, 0-39% bilateral, turbulent flow left subclavian   Chronic diastolic heart failure ([place])    Cyst of thyroid    Diverticulosis of colon (without mention of hemorrhage)    Dysphasia    Possibly esophageal stricture   Esophageal reflux    Hiatal hernia    Hyperlipemia    Hypertension    Lumbago    Lumbar disc disease    MVP (mitral valve prolapse)    Neuropathy     Nonspecific abnormal finding in stool contents    Obesity, unspecified    Osteoarthrosis, unspecified whether generalized or localized, unspecified site    Osteopenia    Other diseases of lung, not elsewhere classified    Peripheral vascular disease, unspecified ([place])    Personal history of colonic polyps    ADENOMATOUS POLYP   PFO (patent foramen ovale)    Patient had a very small PFO by TEE 2010.  This was seen only by bubble analysis during Valsalva   PONV (postoperative nausea and vomiting)    Sleep apnea    uses CPAP nightly   Sleep related hypoventilation/hypoxemia in conditions classifiable elsewhere    Stroke [facility]) 11/2008   Treated with TPA? 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 Victorino Dike.   Unspecified cerebral artery occlusion with cerebral infarction    Unspecified venous (peripheral) insufficiency    Vitamin B 12 deficiency     Medications:  Scheduled:   amLODipine  5 mg Oral q morning   buPROPion  300 mg Oral Daily   diclofenac Sodium  2 g Topical QID   feeding supplement  1 Container Oral TID BM   gabapentin  600 mg Oral TID   isosorbide mononitrate  30 mg Oral QHS   metoprolol tartrate  50 mg Oral BID   pantoprazole  40 mg Oral BID   rosuvastatin  10 mg Oral QHS   Infusions:   ampicillin-sulbactam (UNASYN) IV 3 g (07/13/21 0913)   heparin 1,400 Units/hr (07/13/21 1008)   PRN: acetaminophen, lip balm, oxyCODONE  Assessment: 81 yo female admitted with abdominal pain, found to have diverticulitis.  On 1/30, developed hypoxia on ambulation and CTa showed PE with right heart strain.  Dopplers on 1/31 showed acute DVT involving the R posterior tibial veins.  Pharmacy consulted to dose IV heparin. Of note, has thrombocytopenia which is chronic, d/w MD, ok to proceed with heparin since no bleeding currently reported.  07/13/21 Heparin level = 0.35  (therapeutic) with heparin gtt @ 1400 units/hr Hgb = 11.8, PLTC 93k (low but stable) No complications of therapy noted, no bleeding reported  Goal of Therapy:  Heparin level 0.3-0.7 units/ml Monitor platelets by anticoagulation protocol: Yes   Plan:  Continue heparin gtt at 1400 units/hr Check confirmatory heparin level in 8 hours Follow daily CBC and heparin level Monitor for signs & symptoms of bleeding F/u ability to transition to oral anticoagulation  Dimple Nanas, PharmD 07/13/2021 1:13 PM

## 2021-07-13 NOTE — TOC Progression Note (Signed)
Transition of Care [facility]) - Progression Note    Patient Details  Name: OKLA QAZI MRN: [id number] Date of Birth: [birth year]  Transition of [facility]) CM/SW Contact  Raynald Rouillard, Juliann Pulse, RN Phone Number: 07/13/2021, 11:21 AM  Clinical Narrative:  [facility] rep Corene Cornea already following for HHRN/PT/OT. Cost for apixaban or rivaroxaban-$45.     Expected Discharge Plan: [place] Barriers to Discharge: Continued Medical Work up  Expected Discharge Plan and Services Expected Discharge Plan: [place]   Discharge Planning Services: CM Consult   Living arrangements for the past 2 months: Single Family Home Expected Discharge Date: 07/12/21                                     Social Determinants of Health (SDOH) Interventions    Readmission Risk Interventions Readmission Risk Prevention Plan 12/24/2019  Transportation Screening Complete  Some recent data might be hidden

## 2021-07-14 ENCOUNTER — Telehealth: Payer: Self-pay

## 2021-07-14 ENCOUNTER — Inpatient Hospital Stay (HOSPITAL_COMMUNITY): Payer: PPO

## 2021-07-14 DIAGNOSIS — Z9981 Dependence on supplemental oxygen: Secondary | ICD-10-CM

## 2021-07-14 DIAGNOSIS — E041 Nontoxic single thyroid nodule: Secondary | ICD-10-CM

## 2021-07-14 DIAGNOSIS — I2510 Atherosclerotic heart disease of native coronary artery without angina pectoris: Secondary | ICD-10-CM

## 2021-07-14 DIAGNOSIS — R918 Other nonspecific abnormal finding of lung field: Secondary | ICD-10-CM

## 2021-07-14 DIAGNOSIS — D696 Thrombocytopenia, unspecified: Secondary | ICD-10-CM

## 2021-07-14 DIAGNOSIS — R400 Somnolence: Secondary | ICD-10-CM

## 2021-07-14 DIAGNOSIS — N179 Acute kidney failure, unspecified: Secondary | ICD-10-CM

## 2021-07-14 DIAGNOSIS — J9611 Chronic respiratory failure with hypoxia: Secondary | ICD-10-CM

## 2021-07-14 DIAGNOSIS — R079 Chest pain, unspecified: Secondary | ICD-10-CM

## 2021-07-14 DIAGNOSIS — J9621 Acute and chronic respiratory failure with hypoxia: Secondary | ICD-10-CM

## 2021-07-14 DIAGNOSIS — E876 Hypokalemia: Secondary | ICD-10-CM

## 2021-07-14 LAB — CBC WITH DIFFERENTIAL/PLATELET
Abs Immature Granulocytes: 0.1 10*3/uL — ABNORMAL HIGH (ref 0.00–0.07)
Basophils Absolute: 0.1 10*3/uL (ref 0.0–0.1)
Basophils Relative: 1 %
Eosinophils Absolute: 0.3 10*3/uL (ref 0.0–0.5)
Eosinophils Relative: 4 %
HCT: 36.1 % (ref 36.0–46.0)
Hemoglobin: 11.1 g/dL — ABNORMAL LOW (ref 12.0–15.0)
Immature Granulocytes: 1 %
Lymphocytes Relative: 17 %
Lymphs Abs: 1.3 10*3/uL (ref 0.7–4.0)
MCH: 27.5 pg (ref 26.0–34.0)
MCHC: 30.7 g/dL (ref 30.0–36.0)
MCV: 89.6 fL (ref 80.0–100.0)
Monocytes Absolute: 0.8 10*3/uL (ref 0.1–1.0)
Monocytes Relative: 11 %
Neutro Abs: 4.9 10*3/uL (ref 1.7–7.7)
Neutrophils Relative %: 66 %
Platelets: 107 10*3/uL — ABNORMAL LOW (ref 150–400)
RBC: 4.03 MIL/uL (ref 3.87–5.11)
RDW: 18 % — ABNORMAL HIGH (ref 11.5–15.5)
WBC: 7.4 10*3/uL (ref 4.0–10.5)
nRBC: 0 % (ref 0.0–0.2)

## 2021-07-14 LAB — BASIC METABOLIC PANEL
Anion gap: 7 (ref 5–15)
BUN: 14 mg/dL (ref 8–23)
CO2: 27 mmol/L (ref 22–32)
Calcium: 9.4 mg/dL (ref 8.9–10.3)
Chloride: 103 mmol/L (ref 98–111)
Creatinine, Ser: 0.71 mg/dL (ref 0.44–1.00)
GFR, Estimated: >60 mL/min (ref >60)
Glucose, Bld: 101 mg/dL — ABNORMAL HIGH (ref 70–99)
Potassium: 3.6 mmol/L (ref 3.5–5.1)
Sodium: 137 mmol/L (ref 135–145)

## 2021-07-14 LAB — GLUCOSE, CAPILLARY
Glucose-Capillary: 95 mg/dL (ref 70–99)
Glucose-Capillary: 108 mg/dL — ABNORMAL HIGH (ref 70–99)

## 2021-07-14 LAB — BRAIN NATRIURETIC PEPTIDE: B Natriuretic Peptide: 161.9 pg/mL — ABNORMAL HIGH (ref 0.0–100.0)

## 2021-07-14 LAB — HEPARIN LEVEL (UNFRACTIONATED): Heparin Unfractionated: 0.53 IU/mL (ref 0.30–0.70)

## 2021-07-14 MED ORDER — METOPROLOL TARTRATE 25 MG PO TABS
12.5000 mg | ORAL_TABLET | Freq: Two times a day (BID) | ORAL | Status: DC
  Administered 2021-07-14 – 2021-07-19 (×11): 12.5 mg via ORAL
  Filled 2021-07-14 (×11): qty 1

## 2021-07-14 MED ORDER — ISOSORBIDE MONONITRATE ER 30 MG PO TB24
15.0000 mg | ORAL_TABLET | Freq: Every day | ORAL | Status: DC
  Administered 2021-07-14 – 2021-07-18 (×5): 15 mg via ORAL
  Filled 2021-07-14 (×5): qty 1

## 2021-07-14 MED ORDER — POTASSIUM CHLORIDE CRYS ER 10 MEQ PO TBCR
20.0000 meq | EXTENDED_RELEASE_TABLET | Freq: Once | ORAL | Status: AC
  Administered 2021-07-14: 20 meq via ORAL
  Filled 2021-07-14: qty 2

## 2021-07-14 NOTE — Progress Notes (Signed)
Refused to wear cpap tonight. Will wear her O2 instead.

## 2021-07-14 NOTE — Telephone Encounter (Signed)
-----   Message from [place], MD sent at 07/13/2021  7:42 PM EST ----- Regarding: Hospital follow-up Please schedule patient for hospital follow-up with me in 2 weeks. She is currently hospitalized for pulmonary embolism.

## 2021-07-14 NOTE — Progress Notes (Signed)
ANTICOAGULATION CONSULT NOTE - Initial Consult  Pharmacy Consult for Heparin Indication: pulmonary embolus/DVT  Allergies  Allergen Reactions   Codeine Itching    REACTION: itch    Patient Measurements: Height: 5\' 8"  (172.7 cm) Weight: 90.8 kg (200 lb 2.8 oz) IBW/kg (Calculated) : 63.9 Heparin Dosing Weight: 83.2 kg  Vital Signs: Temp: 98.1 F (36.7 C) (02/01 0511) Temp Source: Oral (02/01 0511) BP: 107/59 (02/01 0511) Pulse Rate: 68 (02/01 0511)  Labs: Recent Labs    07/12/21 9417 07/12/21 4081 07/12/21 1847 07/12/21 2108 07/13/21 0207 07/13/21 0817 07/13/21 1223 07/13/21 1316 07/13/21 2006 07/14/21 0458  HGB 11.8*  --   --   --  11.8*  --   --   --   --  11.1*  HCT 38.3  --   --   --  38.1  --   --   --   --  36.1  PLT 90*  --   --   --  93*  --   --   --   --  107*  APTT  --   --  98*  --   --   --   --   --   --   --   LABPROT  --   --  16.0*  --   --   --   --   --   --   --   INR  --   --  1.3*  --   --   --   --   --   --   --   HEPARINUNFRC  --    < >  --   --  0.27*  --  0.35  --  0.40 0.53  CREATININE 0.85  --   --   --   --  0.86  --   --   --  0.71  TROPONINIHS  --   --  216* 156*  --   --   --  42*  --   --    < > = values in this interval not displayed.     Estimated Creatinine Clearance: 63.9 mL/min (by C-G formula based on SCr of 0.71 mg/dL).   Medical History: Past Medical History:  Diagnosis Date   Achilles tendon rupture, right, initial encounter    Allergic rhinitis, cause unspecified    Anemia, unspecified    Anxiety state, unspecified    Atherosclerosis of aorta ([place])    TEE, June, 2010, grade 4 aortic arch atherosclerosis , Dr. Aundra Dubin   CAD (coronary artery disease)    DES and circumflex 2006 /  DES to LAD 2011   Carotid artery disease ([place])    Doppler, November, 2011, stable, 0-39% bilateral, turbulent flow left subclavian   Chronic diastolic heart failure ([facility])    Cyst of thyroid    Diverticulosis of colon (without mention  of hemorrhage)    Dysphasia    Possibly esophageal stricture   Esophageal reflux    Hiatal hernia    Hyperlipemia    Hypertension    Lumbago    Lumbar disc disease    MVP (mitral valve prolapse)    Neuropathy    Nonspecific abnormal finding in stool contents    Obesity, unspecified    Osteoarthrosis, unspecified whether generalized or localized, unspecified site    Osteopenia    Other diseases of lung, not elsewhere classified    Peripheral vascular disease, unspecified ([place])    Personal history of colonic  polyps    ADENOMATOUS POLYP   PFO (patent foramen ovale)    Patient had a very small PFO by TEE 2010.  This was seen only by bubble analysis during Valsalva   PONV (postoperative nausea and vomiting)    Sleep apnea    uses CPAP nightly   Sleep related hypoventilation/hypoxemia in conditions classifiable elsewhere    Stroke [facility]) 11/2008   Treated with TPA? 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 Victorino Dike.   Unspecified cerebral artery occlusion with cerebral infarction    Unspecified venous (peripheral) insufficiency    Vitamin B 12 deficiency     Medications:  Scheduled:   amLODipine  5 mg Oral q morning   buPROPion  300 mg Oral Daily   diclofenac Sodium  2 g Topical QID   feeding supplement  1 Container Oral TID BM   gabapentin  600 mg Oral TID   isosorbide mononitrate  30 mg Oral QHS   metoprolol tartrate  50 mg Oral BID   pantoprazole  40 mg Oral BID   rosuvastatin  10 mg Oral QHS   Infusions:   ampicillin-sulbactam (UNASYN) IV 3 g (07/14/21 0340)   heparin 1,400 Units/hr (07/14/21 0332)   PRN: acetaminophen, lip balm, oxyCODONE  Assessment: 81 yo female admitted with abdominal pain, found to have diverticulitis.  On 1/30, developed hypoxia on ambulation and CTa showed PE with right heart strain.  Dopplers on 1/31 showed acute DVT involving the R  posterior tibial veins.  Pharmacy consulted to dose IV heparin. Of note, has thrombocytopenia which is chronic, d/w MD, ok to proceed with heparin since no bleeding currently reported.  07/14/21 Daily Heparin level = 0.53 (therapeutic) with heparin infusing at 1400 units/hr Hgb = 11.1, PLTC 107k (low but stable) No complications of therapy noted, no bleeding noted  Goal of Therapy:  Heparin level 0.3-0.7 units/ml Monitor platelets by anticoagulation protocol: Yes   Plan:  Continue heparin infusion at 1400 units/hr Follow daily CBC and heparin level Monitor for signs & symptoms of bleeding F/u ability to transition to oral anticoagulation  Dimple Nanas, PharmD 07/14/2021 7:02 AM

## 2021-07-14 NOTE — Progress Notes (Signed)
PROGRESS NOTE    Stephanie Alvarado  DDU:[id number] DOB: May 22, 1941 DOA: 07/06/2021 PCP: Caryl Bis, MD   Chief Complaint  Patient presents with   Abdominal Pain    Brief Narrative:  Stephanie Alvarado is a 81 y.o. female with history of CAD status post stenting, chronic diastolic CHF, hypertension, sleep apnea uses oxygen at home presents to the ER after patient started having severe abdominal pain involving the mid abdomen and right lower quadrant since 2 PM yesterday.  Pain is constant had some episodes of vomiting no blood in the vomitus denies any diarrhea fever or chills.  She's been found to have small bowel diverticulitis.  She'd improved on IV antibiotics.  Plan was for potential discharge on 1/30, but she was hypoxic with ambulation and CT PE showed PE with right heart strain.  She's been started on heparin.  Pulmonary was consulted due to concern for submassive PE.  Currently receiving anticoagulation, expect she'll need 2-3 days anticoagulation with heparin, then can consider transition to doac and discharge.     Assessment & Plan:   Principal Problem:   Pulmonary embolism (HCC) Active Problems:   Thyroid nodule   Essential hypertension   CAD (coronary artery disease)   Diverticulitis   Chronic diastolic CHF (congestive heart failure) (HCC)   Thrombocytopenia (HCC)   Chronic respiratory failure with hypoxia, on home oxygen therapy (HCC)   Hypokalemia   AKI (acute kidney injury) ([place])   Daytime sleepiness   Right-sided chest pain   Acute on chronic respiratory failure with hypoxia (HCC)   Opacity of lung on imaging study  #1 acute bilateral submassive PE/right lower extremity DVT -Noted to have possibly been provoked during the hospitalization. -CT angiogram chest with concerns for right heart strain. -2D echo obtained with normal right ventricular systolic function, EF 60 to 23%, grade 1 diastolic dysfunction. -Lower extremity Dopplers done with concerns will right  posterior tibial vein DVT. -BNP noted to be mildly elevated. -Troponin is elevated but downtrending in the setting of PE. -PESI class 3-4 (chronic O2 use at home as needed but generally used at bedtime/OSA.  Patient) -Patient seen in consultation by PCCM and no plans for lytics at this point unless patient decompensates. -Blood pressure borderline soft and Will decrease home dose Imdur to 15 mg daily, decrease Lopressor to 12.5 mg twice daily. -Continue IV heparin for another 24 to 48 hours and if continued improvement could transition to DOAC.  2.  Opacity of lung on imaging study -Imaging with right lower lobe airspace opacity-due to hemorrhage versus pneumonia versus edema. -??  Pulmonary infarct. -On empiric IV antibiotics which we will continue.  3.  Acute diverticulitis, POA -Noted on presentation CT abdomen and pelvis. -Patient on IV antibiotics and narrowed to Unasyn to complete a 10-day course of antibiotic treatment. -Diet advanced and tolerating a soft diet. -Was followed by general surgery. -Supportive care.  4.  Acute kidney injury -Improved with hydration. -Renal ultrasound negative for hydronephrosis.  5.  Hypokalemia -Repleted.  Potassium at 3.6.  6.  Chronic respiratory failure with hypoxia, on home O2 therapy -Patient not on oxygen at night for sleep apnea. -Outpatient follow-up with pulmonary.  7.  Daytime sleepiness -VBG with respiratory acidosis but compensated with normal pH. -Continue CPAP nightly.  8.  Thrombocytopenia, chronic -Stable.  Monitor with anticoagulation.  9.  CAD -Stable. -Due to soft blood pressure decrease Imdur to 50 mg daily, metoprolol to 12.5 mg twice daily. -Outpatient follow-up.  10.  Hypertension -Decrease Imdur to 50 mg daily, decrease metoprolol to 12.5 mg daily, discontinue Norvasc due to soft blood pressure and PE.  11.  Right-sided chest pain -Felt likely musculoskeletal in nature as was tender to palpation. -Chest  pain not consistent with ACS. -Chest x-ray with no active infiltrate. -Patient with bilateral PE which may be contributing in part to right-sided chest pain. -Continue well-appearing.  12.  Thyroid nodule -Noted on CT scan, appears to have been biopsied 04/25/2022. -Outpatient follow-up.    DVT prophylaxis: Heparin Code Status: Full Family Communication: Updated patient.  No family at bedside. Disposition:   Status is: Inpatient Remains inpatient appropriate because: Severity of illness  Planned Discharge Destination: Home with Home Health         Consultants:  PCCM: Dr. Patsey Berthold 07/12/2021 General surgery: Dr.Gerkin 07/07/2021  Procedures:  CT abdomen and pelvis 07/06/2021 CT angiogram chest 07/12/2021 Chest x-ray 07/08/2021, 07/11/2021, 07/14/2021 Renal ultrasound 07/08/2021 2D echo 07/13/2021 Bilateral lower extremity Dopplers 07/13/2021   Antimicrobials:   Augmentin 1/25 /2023x1 dose. -IV Unasyn 07/12/2021>>>>> -IV Zosyn 07/07/2021 x 1 dose.  Subjective: Sitting up in bed.  Denies any significant chest pain.  States some improvement with shortness of breath.  Complaining of some occasional bloody nasal clots.  Abdominal pain improved significantly.  Tolerating current diet.  Objective: Vitals:   07/13/21 0905 07/13/21 1307 07/13/21 2215 07/14/21 0511  BP: (!) 110/58 (!) 124/58 (!) 109/49 (!) 107/59  Pulse: 68 70 73 68  Resp:  17 18 18   Temp:  98.6 F (37 C) 98.1 F (36.7 C) 98.1 F (36.7 C)  TempSrc:  Oral Oral Oral  SpO2:  90% 93% 92%  Weight:      Height:        Intake/Output Summary (Last 24 hours) at 07/14/2021 1111 Last data filed at 07/14/2021 1005 Gross per 24 hour  Intake 1267.72 ml  Output 500 ml  Net 767.72 ml   Filed Weights   07/06/21 2142  Weight: 90.8 kg    Examination:  General exam: Appears calm and comfortable  Respiratory system: Clear to auscultation. Respiratory effort normal. Cardiovascular system: S1 & S2 heard, RRR. No JVD,  murmurs, rubs, gallops or clicks. No pedal edema. Gastrointestinal system: Abdomen is nondistended, soft and nontender. No organomegaly or masses felt. Normal bowel sounds heard. Central nervous system: Alert and oriented. No focal neurological deficits. Extremities: Symmetric 5 x 5 power. Skin: No rashes, lesions or ulcers Psychiatry: Judgement and insight appear normal. Mood & affect appropriate.     Data Reviewed: I have personally reviewed following labs and imaging studies  CBC: Recent Labs  Lab 07/08/21 0433 07/09/21 0509 07/10/21 0542 07/11/21 0518 07/12/21 0821 07/13/21 0207 07/14/21 0458  WBC 6.2 7.5 9.5 9.7 10.0 9.4 7.4  NEUTROABS 3.8 4.5 6.6 6.5  --   --  4.9  HGB 13.6 13.4 12.5 12.2 11.8* 11.8* 11.1*  HCT 44.0 44.4 40.7 39.6 38.3 38.1 36.1  MCV 89.6 91.9 90.8 89.6 89.3 89.6 89.6  PLT 139* 126* 98* 95* 90* 93* 107*    Basic Metabolic Panel: Recent Labs  Lab 07/08/21 0433 07/08/21 0854 07/09/21 0509 07/10/21 0542 07/11/21 0518 07/12/21 0821 07/13/21 0817 07/14/21 0458  NA 134*   < > 136 134* 132* 133* 135 137  K 5.2*   < > 4.8 4.3 3.9 4.1 3.7 3.6  CL 101   < > 101 100 97* 98 100 103  CO2 26   < > 30 29 30 28  29  27  GLUCOSE 95   < > 98 105* 109* 103* 99 101*  BUN 10   < > 15 12 15 14 15 14   CREATININE 1.28*   < > 1.15* 0.87 0.93 0.85 0.86 0.71  CALCIUM 9.3   < > 9.6 9.2 9.5 9.2 9.3 9.4  MG 2.2  --  2.3 2.0 2.1  --   --   --   PHOS 3.8  --  3.5 2.7 3.0  --   --   --    < > = values in this interval not displayed.    GFR: Estimated Creatinine Clearance: 63.9 mL/min (by C-G formula based on SCr of 0.71 mg/dL).  Liver Function Tests: Recent Labs  Lab 07/08/21 0433 07/09/21 0509 07/10/21 0542 07/11/21 0518 07/13/21 0817  AST 20 16 17  13* 16  ALT 14 15 12 11 11   ALKPHOS 58 56 52 51 47  BILITOT 0.8 0.8 1.0 0.9 0.6  PROT 6.4* 6.4* 5.9* 6.1* 5.9*  ALBUMIN 3.3* 3.3* 3.0* 2.9* 2.6*    CBG: Recent Labs  Lab 07/13/21 0057 07/13/21 0830  07/13/21 1634 07/13/21 2323 07/14/21 0722  GLUCAP 108* 92 130* 106* 95     Recent Results (from the past 240 hour(s))  Resp Panel by RT-PCR (Flu A&B, Covid) Nasopharyngeal Swab     Status: None   Collection Time: 07/07/21  2:10 AM   Specimen: Nasopharyngeal Swab; Nasopharyngeal(NP) swabs in vial transport medium  Result Value Ref Range Status   SARS Coronavirus 2 by RT PCR NEGATIVE NEGATIVE Final    Comment: (NOTE) SARS-CoV-2 target nucleic acids are NOT DETECTED.  The SARS-CoV-2 RNA is generally detectable in upper respiratory specimens during the acute phase of infection. The lowest concentration of SARS-CoV-2 viral copies this assay can detect is 138 copies/mL. A negative result does not preclude SARS-Cov-2 infection and should not be used as the sole basis for treatment or other patient management decisions. A negative result may occur with  improper specimen collection/handling, submission of specimen other than nasopharyngeal swab, presence of viral mutation(s) within the areas targeted by this assay, and inadequate number of viral copies(<138 copies/mL). A negative result must be combined with clinical observations, patient history, and epidemiological information. The expected result is Negative.  Fact Sheet for Patients:  EntrepreneurPulse.com.au  Fact Sheet for Healthcare Providers:  IncredibleEmployment.be  This test is no t yet approved or cleared by the [place] FDA and  has been authorized for detection and/or diagnosis of SARS-CoV-2 by FDA under an Emergency Use Authorization (EUA). This EUA will remain  in effect (meaning this test can be used) for the duration of the COVID-19 declaration under Section 564(b)(1) of the Act, 21 U.S.C.section 360bbb-3(b)(1), unless the authorization is terminated  or revoked sooner.       Influenza A by PCR NEGATIVE NEGATIVE Final   Influenza B by PCR NEGATIVE NEGATIVE Final     Comment: (NOTE) The Xpert Xpress SARS-CoV-2/FLU/RSV plus assay is intended as an aid in the diagnosis of influenza from Nasopharyngeal swab specimens and should not be used as a sole basis for treatment. Nasal washings and aspirates are unacceptable for Xpert Xpress SARS-CoV-2/FLU/RSV testing.  Fact Sheet for Patients: EntrepreneurPulse.com.au  Fact Sheet for Healthcare Providers: IncredibleEmployment.be  This test is not yet approved or cleared by the [place] FDA and has been authorized for detection and/or diagnosis of SARS-CoV-2 by FDA under an Emergency Use Authorization (EUA). This EUA will remain in effect (meaning this test can  be used) for the duration of the COVID-19 declaration under Section 564(b)(1) of the Act, 21 U.S.C. section 360bbb-3(b)(1), unless the authorization is terminated or revoked.  Performed at [facility], [address], [place], [place] [address]          Radiology Studies: CT Angio Chest Pulmonary Embolism (PE) W or WO Contrast  Result Date: 07/12/2021 CLINICAL DATA:  Chest pain/shortness breath. EXAM: CT ANGIOGRAPHY CHEST WITH CONTRAST TECHNIQUE: Multidetector CT imaging of the chest was performed using the standard protocol during bolus administration of intravenous contrast. Multiplanar CT image reconstructions and MIPs were obtained to evaluate the vascular anatomy. RADIATION DOSE REDUCTION: This exam was performed according to the departmental dose-optimization program which includes automated exposure control, adjustment of the mA and/or kV according to patient size and/or use of iterative reconstruction technique. CONTRAST:  66mL OMNIPAQUE IOHEXOL 350 MG/ML SOLN COMPARISON:  Chest radiograph 07/11/2021 and chest CT 05/31/2016 FINDINGS: Cardiovascular: There is substantial acute bilateral pulmonary embolus in lobar branches of the right upper lobe, right middle lobe, left upper lobe, and  left lower lobe with bridging thrombus in the left main pulmonary artery and in the right pulmonary artery. Clot burden is high. Right ventricular to left ventricular ratio 0.91. Coronary, aortic arch, and branch vessel atherosclerotic vascular disease. Mediastinum/Nodes: Hypodense right thyroid lesion up to 4.2 cm in long axis on image 16 series 5, previously about 3.4 cm. It appears that this nodule was probably biopsied on 04/25/2012. Small type 1 hernia. Lungs/Pleura: There is some patchy indistinct airspace opacity in the right lower lobe potentially from pulmonary hemorrhage, pneumonia, or localized edema. Small right pleural effusion. Upper Abdomen: Atherosclerosis of the abdominal aorta and its branches. Musculoskeletal: Thoracic kyphosis and spondylosis. Suspected lipoma in the right proximal upper arm on image 5 series 4. Review of the MIP images confirms the above findings. IMPRESSION: 1. Extensive bilateral pulmonary embolus. Clot burden is high. Positive for acute PE with CT evidence of right heart strain (RV/LV Ratio = 0.91) consistent with at least submassive (intermediate risk) PE. The presence of right heart strain has been associated with an increased risk of morbidity and mortality. Please refer to the "PE Focused" order set in EPIC. 2. Hypodense right thyroid nodule, this appears to of been biopsied on 04/25/2012. 3. Small type 1 hiatal hernia. 4. Localized airspace opacity in the right lower lobe potentially from pulmonary hemorrhage, pneumonia, or localized edema. Small right pleural effusion. 5. Aortic Atherosclerosis (ICD10-I70.0). Coronary and systemic atherosclerosis. Critical Value/emergent results were called by telephone at the time of interpretation on 07/12/2021 at 5:48 pm to provider Dr. Florene Glen , who verbally acknowledged these results. Electronically Signed   By: Van Clines M.D.   On: 07/12/2021 17:54   DG CHEST PORT 1 VIEW  Result Date: 07/14/2021 CLINICAL DATA:  hypoxia  EXAM: PORTABLE CHEST - 1 VIEW COMPARISON:  07/11/2021 FINDINGS: Lungs are clear. Heart size and mediastinal contours are within normal limits.Aortic Atherosclerosis (ICD10-170.0). No effusion. Visualized bones unremarkable. Surgical clips at the thoracic inlet. IMPRESSION: No acute cardiopulmonary disease. Electronically Signed   By: Lucrezia Europe M.D.   On: 07/14/2021 08:02   ECHOCARDIOGRAM COMPLETE  Result Date: 07/13/2021    ECHOCARDIOGRAM REPORT   Patient Name:   ROSAELENA KEMNITZ Date of Exam: 07/13/2021 Medical Rec #:  [id number]     Height:       68.0 in Accession #:    [id number]    Weight:       200.2  lb Date of Birth:  [birth year]     BSA:          2.045 m Patient Age:    41 years      BP:           110/58 mmHg Patient Gender: F             HR:           71 bpm. Exam Location:  Inpatient Procedure: 2D Echo, Cardiac Doppler and Color Doppler Indications:    pulm. embolus  History:        Patient has prior history of Echocardiogram examinations, most                 recent 11/16/2020. CAD; Risk Factors:Hypertension and                 Dyslipidemia. Mvp/ aov sclrosis.  Sonographer:    Beryle Beams Referring Phys: [phone] A CALDWELL POWELL JR IMPRESSIONS  1. Left ventricular ejection fraction, by estimation, is 60 to 65%. The left ventricle has normal function. The left ventricle has no regional wall motion abnormalities. Left ventricular diastolic parameters are consistent with Grade I diastolic dysfunction (impaired relaxation).  2. Right ventricular systolic function is normal. The right ventricular size is normal.  3. The mitral valve is normal in structure. Mild mitral valve regurgitation. No evidence of mitral stenosis.  4. The aortic valve is tricuspid. There is mild calcification of the aortic valve. Aortic valve regurgitation is not visualized. Aortic valve sclerosis is present, with no evidence of aortic valve stenosis.  5. The inferior vena cava is normal in size with greater than 50% respiratory  variability, suggesting right atrial pressure of 3 mmHg. FINDINGS  Left Ventricle: Left ventricular ejection fraction, by estimation, is 60 to 65%. The left ventricle has normal function. The left ventricle has no regional wall motion abnormalities. The left ventricular internal cavity size was normal in size. There is  no left ventricular hypertrophy. Left ventricular diastolic parameters are consistent with Grade I diastolic dysfunction (impaired relaxation). Right Ventricle: The right ventricular size is normal. No increase in right ventricular wall thickness. Right ventricular systolic function is normal. Left Atrium: Left atrial size was normal in size. Right Atrium: Right atrial size was normal in size. Pericardium: There is no evidence of pericardial effusion. Mitral Valve: The mitral valve is normal in structure. Mild mitral valve regurgitation. No evidence of mitral valve stenosis. Tricuspid Valve: The tricuspid valve is normal in structure. Tricuspid valve regurgitation is not demonstrated. No evidence of tricuspid stenosis. Aortic Valve: The aortic valve is tricuspid. There is mild calcification of the aortic valve. Aortic valve regurgitation is not visualized. Aortic valve sclerosis is present, with no evidence of aortic valve stenosis. Aortic valve mean gradient measures 3.0 mmHg. Aortic valve peak gradient measures 6.4 mmHg. Aortic valve area, by VTI measures 2.76 cm. Pulmonic Valve: The pulmonic valve was normal in structure. Pulmonic valve regurgitation is not visualized. No evidence of pulmonic stenosis. Aorta: The aortic root is normal in size and structure. Venous: The inferior vena cava is normal in size with greater than 50% respiratory variability, suggesting right atrial pressure of 3 mmHg. IAS/Shunts: No atrial level shunt detected by color flow Doppler.  LEFT VENTRICLE PLAX 2D LVIDd:         4.80 cm     Diastology LVIDs:         3.30 cm     LV e' medial:  6.64 cm/s LV PW:         1.00 cm      LV E/e' medial:  11.2 LV IVS:        0.80 cm     LV e' lateral:   8.27 cm/s LVOT diam:     2.10 cm     LV E/e' lateral: 9.0 LV SV:         72 LV SV Index:   35 LVOT Area:     3.46 cm  LV Volumes (MOD) LV vol d, MOD A2C: 58.1 ml LV vol d, MOD A4C: 71.0 ml LV vol s, MOD A2C: 20.1 ml LV vol s, MOD A4C: 27.4 ml LV SV MOD A2C:     38.0 ml LV SV MOD A4C:     71.0 ml LV SV MOD BP:      40.6 ml RIGHT VENTRICLE             IVC RV S prime:     13.10 cm/s  IVC diam: 1.60 cm TAPSE (M-mode): 2.2 cm LEFT ATRIUM             Index        RIGHT ATRIUM           Index LA diam:        4.00 cm 1.96 cm/m   RA Area:     16.50 cm LA Vol (A2C):   54.6 ml 26.71 ml/m  RA Volume:   44.80 ml  21.91 ml/m LA Vol (A4C):   54.2 ml 26.51 ml/m LA Biplane Vol: 54.1 ml 26.46 ml/m  AORTIC VALVE                    PULMONIC VALVE AV Area (Vmax):    3.02 cm     PV Vmax:       0.64 m/s AV Area (Vmean):   2.68 cm     PV Vmean:      42.900 cm/s AV Area (VTI):     2.76 cm     PV VTI:        0.115 m AV Vmax:           126.00 cm/s  PV Peak grad:  1.6 mmHg AV Vmean:          85.500 cm/s  PV Mean grad:  1.0 mmHg AV VTI:            0.262 m AV Peak Grad:      6.4 mmHg AV Mean Grad:      3.0 mmHg LVOT Vmax:         110.00 cm/s LVOT Vmean:        66.200 cm/s LVOT VTI:          0.209 m LVOT/AV VTI ratio: 0.80  AORTA Ao Root diam: 2.80 cm Ao Asc diam:  2.60 cm MITRAL VALVE               TRICUSPID VALVE MV Area (PHT): 4.17 cm    TV Peak grad:   39.8 mmHg MV Decel Time: 182 msec    TV Mean grad:   26.0 mmHg MV E velocity: 74.60 cm/s  TV Vmax:        3.16 m/s MV A velocity: 93.00 cm/s  TV Vmean:       240.0 cm/s MV E/A ratio:  0.80        TV VTI:         1.05 msec  SHUNTS                            Systemic VTI:  0.21 m                            Systemic Diam: 2.10 cm Candee Furbish MD Electronically signed by Candee Furbish MD Signature Date/Time: 07/13/2021/1:50:05 PM    Final    VAS [place] LOWER EXTREMITY VENOUS (DVT)  Result Date:  07/13/2021  Lower Venous DVT Study Patient Name:  MERLENE DANTE  Date of Exam:   07/13/2021 Medical Rec #: [id number]      Accession #:    [id number] Date of Birth: 09/04/1940      Patient Gender: F Patient Age:   15 years Exam Location:  [facility] Procedure:      VAS [place] LOWER EXTREMITY VENOUS (DVT) Referring Phys: A POWELL JR --------------------------------------------------------------------------------  Indications: Pulmonary embolism.  Risk Factors: Confirmed PE. Anticoagulation: Heparin. Limitations: Poor ultrasound/tissue interface. Comparison Study: No prior studies. Performing Technologist: Oliver Hum RVT  Examination Guidelines: A complete evaluation includes B-mode imaging, spectral Doppler, color Doppler, and power Doppler as needed of all accessible portions of each vessel. Bilateral testing is considered an integral part of a complete examination. Limited examinations for reoccurring indications may be performed as noted. The reflux portion of the exam is performed with the patient in reverse Trendelenburg.  +---------+---------------+---------+-----------+----------+--------------+  RIGHT     Compressibility Phasicity Spontaneity Properties Thrombus Aging  +---------+---------------+---------+-----------+----------+--------------+  CFV       Full            Yes       Yes                                    +---------+---------------+---------+-----------+----------+--------------+  SFJ       Full                                                             +---------+---------------+---------+-----------+----------+--------------+  FV Prox   Full                                                             +---------+---------------+---------+-----------+----------+--------------+  FV Mid    Full                                                             +---------+---------------+---------+-----------+----------+--------------+  FV Distal Full                                                              +---------+---------------+---------+-----------+----------+--------------+  PFV       Full                                                             +---------+---------------+---------+-----------+----------+--------------+  POP       Full            Yes       Yes                                    +---------+---------------+---------+-----------+----------+--------------+  PTV       Partial                                          Acute           +---------+---------------+---------+-----------+----------+--------------+  PERO      Full                                                             +---------+---------------+---------+-----------+----------+--------------+   +---------+---------------+---------+-----------+----------+--------------+  LEFT      Compressibility Phasicity Spontaneity Properties Thrombus Aging  +---------+---------------+---------+-----------+----------+--------------+  CFV       Full            Yes       Yes                                    +---------+---------------+---------+-----------+----------+--------------+  SFJ       Full                                                             +---------+---------------+---------+-----------+----------+--------------+  FV Prox   Full                                                             +---------+---------------+---------+-----------+----------+--------------+  FV Mid    Full                                                             +---------+---------------+---------+-----------+----------+--------------+  FV Distal Full                                                             +---------+---------------+---------+-----------+----------+--------------+  PFV       Full                                                             +---------+---------------+---------+-----------+----------+--------------+  POP       Full            Yes       Yes                                     +---------+---------------+---------+-----------+----------+--------------+  PTV       Full                                                             +---------+---------------+---------+-----------+----------+--------------+  PERO      Full                                                             +---------+---------------+---------+-----------+----------+--------------+     Summary: RIGHT: - Findings consistent with acute deep vein thrombosis involving the right posterior tibial veins. - No cystic structure found in the popliteal fossa.  LEFT: - There is no evidence of deep vein thrombosis in the lower extremity.  - No cystic structure found in the popliteal fossa.  *See table(s) above for measurements and observations. Electronically signed by Deitra Mayo MD on 07/13/2021 at 1:10:09 PM.    Final         Scheduled Meds:  buPROPion  300 mg Oral Daily   diclofenac Sodium  2 g Topical QID   feeding supplement  1 Container Oral TID BM   gabapentin  600 mg Oral TID   isosorbide mononitrate  15 mg Oral QHS   metoprolol tartrate  12.5 mg Oral BID   pantoprazole  40 mg Oral BID   potassium chloride  20 mEq Oral Once   rosuvastatin  10 mg Oral QHS   Continuous Infusions:  ampicillin-sulbactam (UNASYN) IV 3 g (07/14/21 1022)   heparin 1,400 Units/hr (07/14/21 0332)     LOS: 7 days    Time spent: 45 minutes    Irine Seal, MD [facility]   To contact the attending provider between 7A-7P or the covering provider during after hours 7P-7A, please log into the web site [email or web address] and access using universal  password for that web site. If you do not have the password, please call the hospital operator.  07/14/2021, 11:11 AM

## 2021-07-14 NOTE — Plan of Care (Signed)
Problem: Education: °Goal: Knowledge of General Education information will improve °Description: Including pain rating scale, medication(s)/side effects and non-pharmacologic comfort measures °Outcome: Progressing °  °Problem: Clinical Measurements: °Goal: Ability to maintain clinical measurements within normal limits will improve °Outcome: Progressing °Goal: Will remain free from infection °Outcome: Progressing °Goal: Diagnostic test results will improve °Outcome: Progressing °  °Problem: Coping: °Goal: Level of anxiety will decrease °Outcome: Progressing °  °Problem: Elimination: °Goal: Will not experience complications related to bowel motility °Outcome: Progressing °Goal: Will not experience complications related to urinary retention °Outcome: Completed/Met °  °Problem: Pain Managment: °Goal: General experience of comfort will improve °Outcome: Progressing °  °Problem: Safety: °Goal: Ability to remain free from injury will improve °Outcome: Progressing °  °Problem: Skin Integrity: °Goal: Risk for impaired skin integrity will decrease °Outcome: Progressing °  °Problem: Education: °Goal: Knowledge of General Education information will improve °Description: Including pain rating scale, medication(s)/side effects and non-pharmacologic comfort measures °Outcome: Progressing °  °Problem: Health Behavior/Discharge Planning: °Goal: Ability to manage health-related needs will improve °Outcome: Progressing °  °

## 2021-07-14 NOTE — Telephone Encounter (Signed)
Per message from Dr. Loanne Drilling, called patient left message to schedule 2wk hospital follow- up

## 2021-07-14 NOTE — Progress Notes (Signed)
Physical Therapy Treatment Patient Details Name: Stephanie Alvarado MRN: [id number] DOB: 04/15/1941 Today's Date: 07/14/2021   History of Present Illness Pt admitted from home 2* abdominal pain and dx with diverticulitis.  Pt found to have PE this hospitalization.  Pt with hx of CAD, carotid artery disease, MVP, neuropathy, PVD, CVA, back surgery, and bil TKR    PT Comments    Pt very agreeable to mobilize.  Pt attempted to ambulate without oxygen however SpO2 dropped so returned to room and applied nasal cannula.  Pt better able to tolerate ambulating with supplemental oxygen.  Pt returned to bed end of session to visit with her daughter and granddaughter.   SATURATION QUALIFICATIONS: (This note is used to comply with regulatory documentation for home oxygen)  Patient Saturations on Room Air at Rest = 93%  Patient Saturations on Room Air while Ambulating = 86%  Patient Saturations on 2 Liters of oxygen while Ambulating = 93%  Please briefly explain why patient needs home oxygen: to improve oxygen saturations above 88% during physical activity such as ambulation.   Recommendations for follow up therapy are one component of a multi-disciplinary discharge planning process, led by the attending physician.  Recommendations may be updated based on patient status, additional functional criteria and insurance authorization.  Follow Up Recommendations  Home health PT     Assistance Recommended at Discharge Frequent or constant Supervision/Assistance  Patient can return home with the following A little help with walking and/or transfers;A little help with bathing/dressing/bathroom;Assistance with cooking/housework;Assist for transportation   Equipment Recommendations  None recommended by PT    Recommendations for Other Services       Precautions / Restrictions Precautions Precautions: Fall Precaution Comments: monitor sats     Mobility  Bed Mobility Overal bed mobility: Needs  Assistance Bed Mobility: Supine to Sit, Sit to Supine     Supine to sit: Min guard, HOB elevated Sit to supine: Min guard, HOB elevated   General bed mobility comments: increased time and effort, required elevated HOB and use of bed rails    Transfers Overall transfer level: Needs assistance Equipment used: Rolling walker (2 wheels) Transfers: Sit to/from Stand Sit to Stand: Min assist           General transfer comment: verbal cues for hand placement and weight shifting    Ambulation/Gait Ambulation/Gait assistance: Min assist, Min guard Gait Distance (Feet): 160 Feet Assistive device: Rolling walker (2 wheels) Gait Pattern/deviations: Step-through pattern, Trunk flexed, Decreased stride length Gait velocity: decr     General Gait Details: cues for posture, positioning from RW, cues for taking standing rest breaks and pursed lip breathing; pt required supplemental oxygen   Stairs             Wheelchair Mobility    Modified Rankin (Stroke Patients Only)       Balance                                            Cognition Arousal/Alertness: Awake/alert Behavior During Therapy: WFL for tasks assessed/performed Overall Cognitive Status: Within Functional Limits for tasks assessed                                          Exercises      General  Comments        Pertinent Vitals/Pain Pain Assessment Pain Assessment: No/denies pain Pain Intervention(s): Monitored during session, Repositioned    Home Living                          Prior Function            PT Goals (current goals can now be found in the care plan section) Progress towards PT goals: Progressing toward goals    Frequency    Min 3X/week      PT Plan Current plan remains appropriate    Co-evaluation              AM-PAC PT "6 Clicks" Mobility   Outcome Measure  Help needed turning from your back to your side while in a  flat bed without using bedrails?: A Lot Help needed moving from lying on your back to sitting on the side of a flat bed without using bedrails?: A Lot Help needed moving to and from a bed to a chair (including a wheelchair)?: A Lot Help needed standing up from a chair using your arms (e.g., wheelchair or bedside chair)?: A Lot Help needed to walk in hospital room?: A Lot Help needed climbing 3-5 steps with a railing? : A Lot 6 Click Score: 12    End of Session Equipment Utilized During Treatment: Gait belt;Oxygen Activity Tolerance: Patient tolerated treatment well Patient left: in bed;with call bell/phone within reach Nurse Communication: Mobility status PT Visit Diagnosis: Muscle weakness (generalized) (M62.81);Difficulty in walking, not elsewhere classified (R26.2)     Time: 1459-1530 PT Time Calculation (min) (ACUTE ONLY): 31 min  Charges:  $Gait Training: 23-37 mins                    Jannette Spanner PT, DPT Acute Rehabilitation Services Pager: [phone] Office: [place] 07/14/2021, 4:30 PM

## 2021-07-15 ENCOUNTER — Ambulatory Visit: Payer: PPO | Admitting: Sports Medicine

## 2021-07-15 LAB — BASIC METABOLIC PANEL
Anion gap: 4 — ABNORMAL LOW (ref 5–15)
BUN: 10 mg/dL (ref 8–23)
CO2: 27 mmol/L (ref 22–32)
Calcium: 9.4 mg/dL (ref 8.9–10.3)
Chloride: 105 mmol/L (ref 98–111)
Creatinine, Ser: 0.76 mg/dL (ref 0.44–1.00)
GFR, Estimated: >60 mL/min (ref >60)
Glucose, Bld: 103 mg/dL — ABNORMAL HIGH (ref 70–99)
Potassium: 3.6 mmol/L (ref 3.5–5.1)
Sodium: 136 mmol/L (ref 135–145)

## 2021-07-15 LAB — CBC
HCT: 36.5 % (ref 36.0–46.0)
Hemoglobin: 11.1 g/dL — ABNORMAL LOW (ref 12.0–15.0)
MCH: 27.4 pg (ref 26.0–34.0)
MCHC: 30.4 g/dL (ref 30.0–36.0)
MCV: 90.1 fL (ref 80.0–100.0)
Platelets: 113 10*3/uL — ABNORMAL LOW (ref 150–400)
RBC: 4.05 MIL/uL (ref 3.87–5.11)
RDW: 17.9 % — ABNORMAL HIGH (ref 11.5–15.5)
WBC: 7.4 10*3/uL (ref 4.0–10.5)
nRBC: 0 % (ref 0.0–0.2)

## 2021-07-15 LAB — MAGNESIUM: Magnesium: 2 mg/dL (ref 1.7–2.4)

## 2021-07-15 LAB — GLUCOSE, CAPILLARY
Glucose-Capillary: 116 mg/dL — ABNORMAL HIGH (ref 70–99)
Glucose-Capillary: 187 mg/dL — ABNORMAL HIGH (ref 70–99)
Glucose-Capillary: 85 mg/dL (ref 70–99)
Glucose-Capillary: 88 mg/dL (ref 70–99)

## 2021-07-15 LAB — HEPARIN LEVEL (UNFRACTIONATED): Heparin Unfractionated: 0.53 IU/mL (ref 0.30–0.70)

## 2021-07-15 MED ORDER — SODIUM CHLORIDE 0.9 % IV SOLN: INTRAVENOUS | Status: DC | PRN

## 2021-07-15 MED ORDER — IPRATROPIUM-ALBUTEROL 0.5-2.5 (3) MG/3ML IN SOLN
3.0000 mL | Freq: Three times a day (TID) | RESPIRATORY_TRACT | Status: DC
  Administered 2021-07-15 – 2021-07-16 (×3): 3 mL via RESPIRATORY_TRACT
  Filled 2021-07-15 (×3): qty 3

## 2021-07-15 MED ORDER — POTASSIUM CHLORIDE CRYS ER 20 MEQ PO TBCR
40.0000 meq | EXTENDED_RELEASE_TABLET | Freq: Once | ORAL | Status: AC
  Administered 2021-07-15: 40 meq via ORAL
  Filled 2021-07-15: qty 2

## 2021-07-15 MED ORDER — FUROSEMIDE 10 MG/ML IJ SOLN
20.0000 mg | Freq: Two times a day (BID) | INTRAMUSCULAR | Status: AC
  Administered 2021-07-15 (×2): 20 mg via INTRAVENOUS
  Filled 2021-07-15 (×2): qty 2

## 2021-07-15 MED ORDER — FLUTICASONE PROPIONATE 50 MCG/ACT NA SUSP
2.0000 | Freq: Every day | NASAL | Status: DC
  Administered 2021-07-15 – 2021-07-19 (×5): 2 via NASAL
  Filled 2021-07-15: qty 16

## 2021-07-15 MED ORDER — METHYLPREDNISOLONE SODIUM SUCC 125 MG IJ SOLR
60.0000 mg | Freq: Once | INTRAMUSCULAR | Status: AC
  Administered 2021-07-15: 60 mg via INTRAVENOUS
  Filled 2021-07-15: qty 2

## 2021-07-15 MED ORDER — IPRATROPIUM-ALBUTEROL 0.5-2.5 (3) MG/3ML IN SOLN: 3.0000 mL | Freq: Three times a day (TID) | RESPIRATORY_TRACT | Status: DC

## 2021-07-15 MED ORDER — LORATADINE 10 MG PO TABS
10.0000 mg | ORAL_TABLET | Freq: Every day | ORAL | Status: DC
  Administered 2021-07-15 – 2021-07-19 (×5): 10 mg via ORAL
  Filled 2021-07-15 (×5): qty 1

## 2021-07-15 NOTE — Progress Notes (Signed)
°   07/15/21 1200  Oxygen Therapy  SpO2 95 %  O2 Device Nasal Cannula  O2 Flow Rate (L/min) 3 L/min  Patient Activity (if Appropriate) Ambulating  Mobility  Activity Ambulated with assistance in hallway  Level of Assistance Contact guard assist, steadying assist  Assistive Device Front wheel walker  Distance Ambulated (ft) 160 ft  Activity Response Tolerated well  $Mobility charge 1 Mobility   Pt agreeable to mobilize this afternoon. Ambulated about 112ft in hall with RW, tolerated well. No complaints. Left pt in the bathroom per her request, educated her on wall call bell when she is finished. RN/NT notified of session.   Mobility Specialist - Progress Note Pre-mobility: SpO2 95% During mobility: SpO2 93-96% Post-mobility: SPO2 97%   [place] Office: [phone]

## 2021-07-15 NOTE — Progress Notes (Signed)
ANTICOAGULATION CONSULT NOTE - Initial Consult  Pharmacy Consult for Heparin Indication: pulmonary embolus/DVT  Allergies  Allergen Reactions   Codeine Itching    REACTION: itch    Patient Measurements: Height: 5\' 8"  (172.7 cm) Weight: 90.8 kg (200 lb 2.8 oz) IBW/kg (Calculated) : 63.9 Heparin Dosing Weight: 83.2 kg  Vital Signs: Temp: 98 F (36.7 C) (02/02 0447) Temp Source: Oral (02/02 0447) BP: 131/59 (02/02 0447) Pulse Rate: 76 (02/02 0447)  Labs: Recent Labs    07/12/21 1847 07/12/21 2108 07/13/21 0207 07/13/21 0817 07/13/21 1223 07/13/21 1316 07/13/21 2006 07/14/21 0458 07/15/21 0519  HGB  --   --  11.8*  --   --   --   --  11.1* 11.1*  HCT  --   --  38.1  --   --   --   --  36.1 36.5  PLT  --   --  93*  --   --   --   --  107* 113*  APTT 98*  --   --   --   --   --   --   --   --   LABPROT 16.0*  --   --   --   --   --   --   --   --   INR 1.3*  --   --   --   --   --   --   --   --   HEPARINUNFRC  --   --  0.27*  --    < >  --  0.40 0.53 0.53  CREATININE  --   --   --  0.86  --   --   --  0.71 0.76  TROPONINIHS 216* 156*  --   --   --  42*  --   --   --    < > = values in this interval not displayed.     Estimated Creatinine Clearance: 63.9 mL/min (by C-G formula based on SCr of 0.76 mg/dL).   Medical History: Past Medical History:  Diagnosis Date   Achilles tendon rupture, right, initial encounter    Allergic rhinitis, cause unspecified    Anemia, unspecified    Anxiety state, unspecified    Atherosclerosis of aorta ([place])    TEE, June, 2010, grade 4 aortic arch atherosclerosis , Dr. Aundra Dubin   CAD (coronary artery disease)    DES and circumflex 2006 /  DES to LAD 2011   Carotid artery disease ([place])    Doppler, November, 2011, stable, 0-39% bilateral, turbulent flow left subclavian   Chronic diastolic heart failure ([place])    Cyst of thyroid    Diverticulosis of colon (without mention of hemorrhage)    Dysphasia    Possibly esophageal stricture    Esophageal reflux    Hiatal hernia    Hyperlipemia    Hypertension    Lumbago    Lumbar disc disease    MVP (mitral valve prolapse)    Neuropathy    Nonspecific abnormal finding in stool contents    Obesity, unspecified    Osteoarthrosis, unspecified whether generalized or localized, unspecified site    Osteopenia    Other diseases of lung, not elsewhere classified    Peripheral vascular disease, unspecified ([place])    Personal history of colonic polyps    ADENOMATOUS POLYP   PFO (patent foramen ovale)    Patient had a very small PFO by TEE 2010.  This was seen only  by bubble analysis during Valsalva   PONV (postoperative nausea and vomiting)    Sleep apnea    uses CPAP nightly   Sleep related hypoventilation/hypoxemia in conditions classifiable elsewhere    Stroke [facility]) 11/2008   Treated with TPA? 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 Victorino Dike.   Unspecified cerebral artery occlusion with cerebral infarction    Unspecified venous (peripheral) insufficiency    Vitamin B 12 deficiency     Medications:  Scheduled:   buPROPion  300 mg Oral Daily   diclofenac Sodium  2 g Topical QID   feeding supplement  1 Container Oral TID BM   gabapentin  600 mg Oral TID   isosorbide mononitrate  15 mg Oral QHS   metoprolol tartrate  12.5 mg Oral BID   pantoprazole  40 mg Oral BID   rosuvastatin  10 mg Oral QHS   Infusions:   ampicillin-sulbactam (UNASYN) IV 3 g (07/15/21 0510)   heparin 1,400 Units/hr (07/14/21 1858)   PRN: acetaminophen, lip balm, oxyCODONE  Assessment: 81 yo female admitted with abdominal pain, found to have diverticulitis.  On 1/30, developed hypoxia on ambulation and CTa showed PE with right heart strain.  Dopplers on 1/31 showed acute DVT involving the R posterior tibial veins.  Pharmacy consulted to dose IV heparin. Of note, has thrombocytopenia which is  chronic, d/w MD, ok to proceed with heparin since no bleeding currently reported.  07/15/21 Daily Heparin level = 0.53 (therapeutic) with heparin infusing at 1400 units/hr Hgb = 11.1, PLTC 113k (stable) No complications of therapy noted, no bleeding noted  Goal of Therapy:  Heparin level 0.3-0.7 units/ml Monitor platelets by anticoagulation protocol: Yes   Plan:  Continue heparin infusion at 1400 units/hr Follow daily CBC and heparin level Monitor for signs & symptoms of bleeding F/u ability to transition to oral anticoagulation  Dimple Nanas, PharmD 07/15/2021 8:15 AM

## 2021-07-15 NOTE — Progress Notes (Signed)
Pt refused CPAP qhs again tonight.  States she is unable to tolerate with the nose bleeds.  Pt encouraged to contact RT should she change her mind.

## 2021-07-15 NOTE — Plan of Care (Signed)
Problem: Education: Goal: Knowledge of General Education information will improve Description: Including pain rating scale, medication(s)/side effects and non-pharmacologic comfort measures Outcome: Completed/Met   Problem: Health Behavior/Discharge Planning: Goal: Ability to manage health-related needs will improve Outcome: Progressing   Problem: Health Behavior/Discharge Planning: Goal: Ability to manage health-related needs will improve Outcome: Progressing   Problem: Clinical Measurements: Goal: Ability to maintain clinical measurements within normal limits will improve Outcome: Progressing Goal: Will remain free from infection Outcome: Progressing Goal: Diagnostic test results will improve Outcome: Progressing Goal: Respiratory complications will improve Outcome: Progressing Goal: Cardiovascular complication will be avoided Outcome: Progressing   Problem: Activity: Goal: Risk for activity intolerance will decrease Outcome: Progressing   Problem: Nutrition: Goal: Adequate nutrition will be maintained Outcome: Progressing   Problem: Coping: Goal: Level of anxiety will decrease Outcome: Progressing   Problem: Elimination: Goal: Will not experience complications related to bowel motility Outcome: Progressing   Problem: Pain Managment: Goal: General experience of comfort will improve Outcome: Progressing   Problem: Safety: Goal: Ability to remain free from injury will improve Outcome: Progressing   Problem: Skin Integrity: Goal: Risk for impaired skin integrity will decrease Outcome: Progressing   Problem: Health Behavior/Discharge Planning: Goal: Ability to manage health-related needs will improve Outcome: Progressing   Problem: Clinical Measurements: Goal: Ability to maintain clinical measurements within normal limits will improve Outcome: Progressing Goal: Will remain free from infection Outcome: Progressing Goal: Diagnostic test results will  improve Outcome: Progressing Goal: Respiratory complications will improve Outcome: Progressing Goal: Cardiovascular complication will be avoided Outcome: Progressing   Problem: Activity: Goal: Risk for activity intolerance will decrease Outcome: Progressing   Problem: Nutrition: Goal: Adequate nutrition will be maintained Outcome: Progressing   Problem: Coping: Goal: Level of anxiety will decrease Outcome: Progressing   Problem: Elimination: Goal: Will not experience complications related to bowel motility Outcome: Progressing   Problem: Pain Managment: Goal: General experience of comfort will improve Outcome: Progressing   Problem: Safety: Goal: Ability to remain free from injury will improve Outcome: Progressing   Problem: Skin Integrity: Goal: Risk for impaired skin integrity will decrease Outcome: Progressing

## 2021-07-15 NOTE — Progress Notes (Addendum)
PROGRESS NOTE    Stephanie Alvarado  NFA:[id number] DOB: 07/10/1940 DOA: 07/06/2021 PCP: Caryl Bis, MD   Chief Complaint  Patient presents with   Abdominal Pain    Brief Narrative:  Stephanie Alvarado is a 81 y.o. female with history of CAD status post stenting, chronic diastolic CHF, hypertension, sleep apnea uses oxygen at home presents to the ER after patient started having severe abdominal pain involving the mid abdomen and right lower quadrant since 2 PM yesterday.  Pain is constant had some episodes of vomiting no blood in the vomitus denies any diarrhea fever or chills.  She's been found to have small bowel diverticulitis.  She'd improved on IV antibiotics.  Plan was for potential discharge on 1/30, but she was hypoxic with ambulation and CT PE showed PE with right heart strain.  She's been started on heparin.  Pulmonary was consulted due to concern for submassive PE.  Currently receiving anticoagulation, expect she'll need 2-3 days anticoagulation with heparin, then can consider transition to doac and discharge.     Assessment & Plan:   Principal Problem:   Pulmonary embolism (HCC) Active Problems:   Thyroid nodule   Essential hypertension   CAD (coronary artery disease)   Diverticulitis   Chronic diastolic CHF (congestive heart failure) (HCC)   Thrombocytopenia (HCC)   Chronic respiratory failure with hypoxia, on home oxygen therapy (HCC)   Hypokalemia   AKI (acute kidney injury) ([place])   Daytime sleepiness   Chest pain   Acute on chronic respiratory failure with hypoxia (HCC)   Opacity of lung on imaging study  #1 acute bilateral submassive PE/right lower extremity DVT -Noted to have possibly been provoked during the hospitalization. -CT angiogram chest with concerns for right heart strain. -2D echo obtained with normal right ventricular systolic function, EF 60 to 46%, grade 1 diastolic dysfunction. -Lower extremity Dopplers done with concerns will right posterior tibial  vein DVT. -BNP noted to be mildly elevated. -Troponin is elevated but downtrending in the setting of PE. -PESI class 3-4 (chronic O2 use at home as needed but generally used at bedtime/OSA.  Patient) -Patient seen in consultation by PCCM and no plans for lytics at this point unless patient decompensates. -Blood pressure was borderline but improved with decreased doses of Imdur and Lopressor.   -We will give Lasix 20 mg IV every 12 hours x2 doses.   -Continue IV heparin for another 24 hours and if continued improvement will transition to [place] tomorrow.   2.  Opacity of lung on imaging study -Imaging with right lower lobe airspace opacity-due to hemorrhage versus pneumonia versus edema. -??  Pulmonary infarct. -On empiric IV antibiotics which we will continue.  3.  Acute diverticulitis, POA -Noted on presentation CT abdomen and pelvis. -Patient on IV antibiotics and narrowed to Unasyn to complete a 10-day course of antibiotic treatment. -Diet advanced and tolerating a soft diet. -Was followed by general surgery. -Supportive care.  4.  Acute kidney injury -Improved with hydration. -Renal ultrasound negative for hydronephrosis.  5.  Hypokalemia -Repleted.  -Potassium at 3.6, monitor with diuresis.  6.  Chronic respiratory failure with hypoxia, on home O2 therapy -Patient on oxygen at night for sleep apnea. -Outpatient follow-up with pulmonary.  7.  Daytime sleepiness -VBG with respiratory acidosis but compensated with normal pH. -Continue CPAP nightly.  8.  Thrombocytopenia, chronic -Stable.  Monitor with anticoagulation.  9.  CAD -Stable. -Due to soft blood pressure decrease Imdur to 15 mg daily, metoprolol to  12.5 mg twice daily. -Outpatient follow-up.  10.  Hypertension -Soft blood pressure improved on decreased dose of Imdur and decrease dose of metoprolol.   -Continue to hold Norvasc.   -Monitor on IV Lasix.   11.  Right-sided chest pain -Felt likely  musculoskeletal in nature as was tender to palpation. -Chest pain not consistent with ACS. -Chest x-ray with no active infiltrate. -Patient with bilateral PE which may be contributing in part to right-sided chest pain. -Continue heparin. -Symptomatic treatment.  12.  Thyroid nodule -Noted on CT scan, appears to have been biopsied 04/25/2022. -Outpatient follow-up.  13.  Wheezing -Patient with some wheezing noted on examination. -Patient prior tobacco use. -Concern for also possible volume overload. -Lasix 20 mg IV every 12 hours x2 doses. -Solu-Medrol 60 mg IV x1. -Place on scheduled duo nebs.    DVT prophylaxis: Heparin Code Status: Full Family Communication: Updated patient.  No family at bedside. Disposition:   Status is: Inpatient Remains inpatient appropriate because: Severity of illness  Planned Discharge Destination: Home with Home Health         Consultants:  PCCM: Dr. Patsey Berthold 07/12/2021 General surgery: Dr.Gerkin 07/07/2021  Procedures:  CT abdomen and pelvis 07/06/2021 CT angiogram chest 07/12/2021 Chest x-ray 07/08/2021, 07/11/2021, 07/14/2021 Renal ultrasound 07/08/2021 2D echo 07/13/2021 Bilateral lower extremity Dopplers 07/13/2021   Antimicrobials:   Augmentin 1/25 /2023x1 dose. -IV Unasyn 07/12/2021>>>>> -IV Zosyn 07/07/2021 x 1 dose.  Subjective: Sitting up in bed.  Overall feeling better than she did.  Some improvement with shortness of breath.  No chest pain.  No abdominal pain.  Tolerating current diet.   Objective: Vitals:   07/14/21 1832 07/14/21 2010 07/15/21 0207 07/15/21 0447  BP: 125/62 135/69 (!) 153/70 (!) 131/59  Pulse: 74 (!) 56 93 76  Resp: 19  16 16   Temp: 97.8 F (36.6 C) 97.8 F (36.6 C) 99.6 F (37.6 C) 98 F (36.7 C)  TempSrc: Oral Oral Oral Oral  SpO2: 95% 97% 100% 94%  Weight:      Height:        Intake/Output Summary (Last 24 hours) at 07/15/2021 1008 Last data filed at 07/15/2021 0926 Gross per 24 hour  Intake  584.1 ml  Output 2100 ml  Net -1515.9 ml    Filed Weights   07/06/21 2142  Weight: 90.8 kg    Examination:  General exam: NAD. Respiratory system: Mild diffuse expiratory wheezing.  Some scattered crackles.  No rhonchi.  Fair air movement.  Speaking in full sentences.  Cardiovascular system: Regular rate rhythm no murmurs rubs or gallops.  No JVD.  No lower extremity edema. Gastrointestinal system: Abdomen soft, nontender, nondistended, positive bowel sounds.  No rebound.  No guarding.  Central nervous system: Alert and oriented. No focal neurological deficits. Extremities: Symmetric 5 x 5 power. Skin: No rashes, lesions or ulcers Psychiatry: Judgement and insight appear normal. Mood & affect appropriate.     Data Reviewed: I have personally reviewed following labs and imaging studies  CBC: Recent Labs  Lab 07/09/21 0509 07/10/21 0542 07/11/21 0518 07/12/21 2119 07/13/21 0207 07/14/21 0458 07/15/21 0519  WBC 7.5 9.5 9.7 10.0 9.4 7.4 7.4  NEUTROABS 4.5 6.6 6.5  --   --  4.9  --   HGB 13.4 12.5 12.2 11.8* 11.8* 11.1* 11.1*  HCT 44.4 40.7 39.6 38.3 38.1 36.1 36.5  MCV 91.9 90.8 89.6 89.3 89.6 89.6 90.1  PLT 126* 98* 95* 90* 93* 107* 113*     Basic Metabolic Panel: Recent  Labs  Lab 07/09/21 0509 07/10/21 0542 07/11/21 0518 07/12/21 0821 07/13/21 0817 07/14/21 0458 07/15/21 0519  NA 136 134* 132* 133* 135 137 136  K 4.8 4.3 3.9 4.1 3.7 3.6 3.6  CL 101 100 97* 98 100 103 105  CO2 30 29 30 28 29 27 27   GLUCOSE 98 105* 109* 103* 99 101* 103*  BUN 15 12 15 14 15 14 10   CREATININE 1.15* 0.87 0.93 0.85 0.86 0.71 0.76  CALCIUM 9.6 9.2 9.5 9.2 9.3 9.4 9.4  MG 2.3 2.0 2.1  --   --   --  2.0  PHOS 3.5 2.7 3.0  --   --   --   --      GFR: Estimated Creatinine Clearance: 63.9 mL/min (by C-G formula based on SCr of 0.76 mg/dL).  Liver Function Tests: Recent Labs  Lab 07/09/21 0509 07/10/21 0542 07/11/21 0518 07/13/21 0817  AST 16 17 13* 16  ALT 15 12 11 11    ALKPHOS 56 52 51 47  BILITOT 0.8 1.0 0.9 0.6  PROT 6.4* 5.9* 6.1* 5.9*  ALBUMIN 3.3* 3.0* 2.9* 2.6*     CBG: Recent Labs  Lab 07/13/21 2323 07/14/21 0722 07/14/21 1624 07/15/21 0052 07/15/21 0736  GLUCAP 106* 95 108* 85 88      Recent Results (from the past 240 hour(s))  Resp Panel by RT-PCR (Flu A&B, Covid) Nasopharyngeal Swab     Status: None   Collection Time: 07/07/21  2:10 AM   Specimen: Nasopharyngeal Swab; Nasopharyngeal(NP) swabs in vial transport medium  Result Value Ref Range Status   SARS Coronavirus 2 by RT PCR NEGATIVE NEGATIVE Final    Comment: (NOTE) SARS-CoV-2 target nucleic acids are NOT DETECTED.  The SARS-CoV-2 RNA is generally detectable in upper respiratory specimens during the acute phase of infection. The lowest concentration of SARS-CoV-2 viral copies this assay can detect is 138 copies/mL. A negative result does not preclude SARS-Cov-2 infection and should not be used as the sole basis for treatment or other patient management decisions. A negative result may occur with  improper specimen collection/handling, submission of specimen other than nasopharyngeal swab, presence of viral mutation(s) within the areas targeted by this assay, and inadequate number of viral copies(<138 copies/mL). A negative result must be combined with clinical observations, patient history, and epidemiological information. The expected result is Negative.  Fact Sheet for Patients:  EntrepreneurPulse.com.au  Fact Sheet for Healthcare Providers:  IncredibleEmployment.be  This test is no t yet approved or cleared by the [place] FDA and  has been authorized for detection and/or diagnosis of SARS-CoV-2 by FDA under an Emergency Use Authorization (EUA). This EUA will remain  in effect (meaning this test can be used) for the duration of the COVID-19 declaration under Section 564(b)(1) of the Act, 21 U.S.C.section 360bbb-3(b)(1),  unless the authorization is terminated  or revoked sooner.       Influenza A by PCR NEGATIVE NEGATIVE Final   Influenza B by PCR NEGATIVE NEGATIVE Final    Comment: (NOTE) The Xpert Xpress SARS-CoV-2/FLU/RSV plus assay is intended as an aid in the diagnosis of influenza from Nasopharyngeal swab specimens and should not be used as a sole basis for treatment. Nasal washings and aspirates are unacceptable for Xpert Xpress SARS-CoV-2/FLU/RSV testing.  Fact Sheet for Patients: EntrepreneurPulse.com.au  Fact Sheet for Healthcare Providers: IncredibleEmployment.be  This test is not yet approved or cleared by the [place] FDA and has been authorized for detection and/or diagnosis of SARS-CoV-2 by FDA  under an Emergency Use Authorization (EUA). This EUA will remain in effect (meaning this test can be used) for the duration of the COVID-19 declaration under Section 564(b)(1) of the Act, 21 U.S.C. section 360bbb-3(b)(1), unless the authorization is terminated or revoked.  Performed at [facility], [place] [address], [place], [place] [address]           Radiology Studies: DG CHEST PORT 1 VIEW  Result Date: 07/14/2021 CLINICAL DATA:  hypoxia EXAM: PORTABLE CHEST - 1 VIEW COMPARISON:  07/11/2021 FINDINGS: Lungs are clear. Heart size and mediastinal contours are within normal limits.Aortic Atherosclerosis (ICD10-170.0). No effusion. Visualized bones unremarkable. Surgical clips at the thoracic inlet. IMPRESSION: No acute cardiopulmonary disease. Electronically Signed   By: Lucrezia Europe M.D.   On: 07/14/2021 08:02   ECHOCARDIOGRAM COMPLETE  Result Date: 07/13/2021    ECHOCARDIOGRAM REPORT   Patient Name:   BRINLYN CENA Date of Exam: 07/13/2021 Medical Rec #:  [id number]     Height:       68.0 in Accession #:    [id number]    Weight:       200.2 lb Date of Birth:  11/15/1940     BSA:          2.045 m Patient Age:    52 years      BP:            110/58 mmHg Patient Gender: F             HR:           71 bpm. Exam Location:  Inpatient Procedure: 2D Echo, Cardiac Doppler and Color Doppler Indications:    pulm. embolus  History:        Patient has prior history of Echocardiogram examinations, most                 recent 11/16/2020. CAD; Risk Factors:Hypertension and                 Dyslipidemia. Mvp/ aov sclrosis.  Sonographer:    Beryle Beams Referring Phys: [phone] CALDWELL POWELL JR IMPRESSIONS  1. Left ventricular ejection fraction, by estimation, is 60 to 65%. The left ventricle has normal function. The left ventricle has no regional wall motion abnormalities. Left ventricular diastolic parameters are consistent with Grade I diastolic dysfunction (impaired relaxation).  2. Right ventricular systolic function is normal. The right ventricular size is normal.  3. The mitral valve is normal in structure. Mild mitral valve regurgitation. No evidence of mitral stenosis.  4. The aortic valve is tricuspid. There is mild calcification of the aortic valve. Aortic valve regurgitation is not visualized. Aortic valve sclerosis is present, with no evidence of aortic valve stenosis.  5. The inferior vena cava is normal in size with greater than 50% respiratory variability, suggesting right atrial pressure of 3 mmHg. FINDINGS  Left Ventricle: Left ventricular ejection fraction, by estimation, is 60 to 65%. The left ventricle has normal function. The left ventricle has no regional wall motion abnormalities. The left ventricular internal cavity size was normal in size. There is  no left ventricular hypertrophy. Left ventricular diastolic parameters are consistent with Grade I diastolic dysfunction (impaired relaxation). Right Ventricle: The right ventricular size is normal. No increase in right ventricular wall thickness. Right ventricular systolic function is normal. Left Atrium: Left atrial size was normal in size. Right Atrium: Right atrial size was normal in  size. Pericardium: There is no evidence of pericardial effusion. Mitral  Valve: The mitral valve is normal in structure. Mild mitral valve regurgitation. No evidence of mitral valve stenosis. Tricuspid Valve: The tricuspid valve is normal in structure. Tricuspid valve regurgitation is not demonstrated. No evidence of tricuspid stenosis. Aortic Valve: The aortic valve is tricuspid. There is mild calcification of the aortic valve. Aortic valve regurgitation is not visualized. Aortic valve sclerosis is present, with no evidence of aortic valve stenosis. Aortic valve mean gradient measures 3.0 mmHg. Aortic valve peak gradient measures 6.4 mmHg. Aortic valve area, by VTI measures 2.76 cm. Pulmonic Valve: The pulmonic valve was normal in structure. Pulmonic valve regurgitation is not visualized. No evidence of pulmonic stenosis. Aorta: The aortic root is normal in size and structure. Venous: The inferior vena cava is normal in size with greater than 50% respiratory variability, suggesting right atrial pressure of 3 mmHg. IAS/Shunts: No atrial level shunt detected by color flow Doppler.  LEFT VENTRICLE PLAX 2D LVIDd:         4.80 cm     Diastology LVIDs:         3.30 cm     LV e' medial:    6.64 cm/s LV PW:         1.00 cm     LV E/e' medial:  11.2 LV IVS:        0.80 cm     LV e' lateral:   8.27 cm/s LVOT diam:     2.10 cm     LV E/e' lateral: 9.0 LV SV:         72 LV SV Index:   35 LVOT Area:     3.46 cm  LV Volumes (MOD) LV vol d, MOD A2C: 58.1 ml LV vol d, MOD A4C: 71.0 ml LV vol s, MOD A2C: 20.1 ml LV vol s, MOD A4C: 27.4 ml LV SV MOD A2C:     38.0 ml LV SV MOD A4C:     71.0 ml LV SV MOD BP:      40.6 ml RIGHT VENTRICLE             IVC RV S prime:     13.10 cm/s  IVC diam: 1.60 cm TAPSE (M-mode): 2.2 cm LEFT ATRIUM             Index        RIGHT ATRIUM           Index LA diam:        4.00 cm 1.96 cm/m   RA Area:     16.50 cm LA Vol (A2C):   54.6 ml 26.71 ml/m  RA Volume:   44.80 ml  21.91 ml/m LA Vol (A4C):    54.2 ml 26.51 ml/m LA Biplane Vol: 54.1 ml 26.46 ml/m  AORTIC VALVE                    PULMONIC VALVE AV Area (Vmax):    3.02 cm     PV Vmax:       0.64 m/s AV Area (Vmean):   2.68 cm     PV Vmean:      42.900 cm/s AV Area (VTI):     2.76 cm     PV VTI:        0.115 m AV Vmax:           126.00 cm/s  PV Peak grad:  1.6 mmHg AV Vmean:          85.500 cm/s  PV Mean  grad:  1.0 mmHg AV VTI:            0.262 m AV Peak Grad:      6.4 mmHg AV Mean Grad:      3.0 mmHg LVOT Vmax:         110.00 cm/s LVOT Vmean:        66.200 cm/s LVOT VTI:          0.209 m LVOT/AV VTI ratio: 0.80  AORTA Ao Root diam: 2.80 cm Ao Asc diam:  2.60 cm MITRAL VALVE               TRICUSPID VALVE MV Area (PHT): 4.17 cm    TV Peak grad:   39.8 mmHg MV Decel Time: 182 msec    TV Mean grad:   26.0 mmHg MV E velocity: 74.60 cm/s  TV Vmax:        3.16 m/s MV A velocity: 93.00 cm/s  TV Vmean:       240.0 cm/s MV E/A ratio:  0.80        TV VTI:         1.05 msec                             SHUNTS                            Systemic VTI:  0.21 m                            Systemic Diam: 2.10 cm Candee Furbish MD Electronically signed by Candee Furbish MD Signature Date/Time: 07/13/2021/1:50:05 PM    Final         Scheduled Meds:  buPROPion  300 mg Oral Daily   diclofenac Sodium  2 g Topical QID   feeding supplement  1 Container Oral TID BM   gabapentin  600 mg Oral TID   isosorbide mononitrate  15 mg Oral QHS   metoprolol tartrate  12.5 mg Oral BID   pantoprazole  40 mg Oral BID   rosuvastatin  10 mg Oral QHS   Continuous Infusions:  sodium chloride 10 mL/hr at 07/15/21 0932   ampicillin-sulbactam (UNASYN) IV 3 g (07/15/21 0936)   heparin 1,400 Units/hr (07/14/21 1858)     LOS: 8 days    Time spent: 45 minutes    Irine Seal, MD [facility]   To contact the attending provider between 7A-7P or the covering provider during after hours 7P-7A, please log into the web site [email or web address] and access using universal [facility] password for that web site. If you do not have the password, please call the hospital operator.  07/15/2021, 10:08 AM

## 2021-07-16 ENCOUNTER — Other Ambulatory Visit (HOSPITAL_COMMUNITY): Payer: Self-pay

## 2021-07-16 DIAGNOSIS — I5032 Chronic diastolic (congestive) heart failure: Secondary | ICD-10-CM

## 2021-07-16 LAB — GLUCOSE, CAPILLARY
Glucose-Capillary: 127 mg/dL — ABNORMAL HIGH (ref 70–99)
Glucose-Capillary: 144 mg/dL — ABNORMAL HIGH (ref 70–99)

## 2021-07-16 LAB — BASIC METABOLIC PANEL
Anion gap: 5 (ref 5–15)
BUN: 12 mg/dL (ref 8–23)
CO2: 28 mmol/L (ref 22–32)
Calcium: 9.8 mg/dL (ref 8.9–10.3)
Chloride: 102 mmol/L (ref 98–111)
Creatinine, Ser: 0.76 mg/dL (ref 0.44–1.00)
GFR, Estimated: >60 mL/min (ref >60)
Glucose, Bld: 148 mg/dL — ABNORMAL HIGH (ref 70–99)
Potassium: 4.4 mmol/L (ref 3.5–5.1)
Sodium: 135 mmol/L (ref 135–145)

## 2021-07-16 LAB — CBC
HCT: 36.3 % (ref 36.0–46.0)
Hemoglobin: 11.3 g/dL — ABNORMAL LOW (ref 12.0–15.0)
MCH: 27.6 pg (ref 26.0–34.0)
MCHC: 31.1 g/dL (ref 30.0–36.0)
MCV: 88.5 fL (ref 80.0–100.0)
Platelets: 133 10*3/uL — ABNORMAL LOW (ref 150–400)
RBC: 4.1 MIL/uL (ref 3.87–5.11)
RDW: 17.4 % — ABNORMAL HIGH (ref 11.5–15.5)
WBC: 10.2 10*3/uL (ref 4.0–10.5)
nRBC: 0 % (ref 0.0–0.2)

## 2021-07-16 LAB — HEPARIN LEVEL (UNFRACTIONATED): Heparin Unfractionated: 0.7 IU/mL (ref 0.30–0.70)

## 2021-07-16 LAB — MAGNESIUM: Magnesium: 2.1 mg/dL (ref 1.7–2.4)

## 2021-07-16 MED ORDER — POLYETHYLENE GLYCOL 3350 17 G PO PACK
17.0000 g | PACK | Freq: Every day | ORAL | Status: DC | PRN
  Administered 2021-07-16: 17 g via ORAL
  Filled 2021-07-16: qty 1

## 2021-07-16 MED ORDER — IPRATROPIUM-ALBUTEROL 0.5-2.5 (3) MG/3ML IN SOLN
3.0000 mL | Freq: Two times a day (BID) | RESPIRATORY_TRACT | Status: DC
  Administered 2021-07-16 – 2021-07-19 (×6): 3 mL via RESPIRATORY_TRACT
  Filled 2021-07-16 (×6): qty 3

## 2021-07-16 MED ORDER — POLYETHYLENE GLYCOL 3350 17 GM/SCOOP PO POWD
17.0000 g | Freq: Every day | ORAL | Status: DC | PRN
  Filled 2021-07-16: qty 255

## 2021-07-16 MED ORDER — APIXABAN 5 MG PO TABS
10.0000 mg | ORAL_TABLET | Freq: Two times a day (BID) | ORAL | Status: DC
  Administered 2021-07-16 – 2021-07-19 (×7): 10 mg via ORAL
  Filled 2021-07-16 (×7): qty 2

## 2021-07-16 MED ORDER — APIXABAN 5 MG PO TABS: 5.0000 mg | ORAL_TABLET | Freq: Two times a day (BID) | ORAL | Status: DC

## 2021-07-16 MED ORDER — FUROSEMIDE 10 MG/ML IJ SOLN
20.0000 mg | Freq: Two times a day (BID) | INTRAMUSCULAR | Status: AC
  Administered 2021-07-16 – 2021-07-17 (×2): 20 mg via INTRAVENOUS
  Filled 2021-07-16 (×2): qty 2

## 2021-07-16 MED ORDER — LINACLOTIDE 145 MCG PO CAPS
145.0000 ug | ORAL_CAPSULE | Freq: Every day | ORAL | Status: DC
  Administered 2021-07-17: 145 ug via ORAL
  Filled 2021-07-16 (×3): qty 1

## 2021-07-16 NOTE — TOC Progression Note (Addendum)
Transition of Care [facility]) - Progression Note    Patient Details  Name: Stephanie Alvarado MRN: [id number] Date of Birth: 12/22/1940  Transition of Care [facility]) CM/SW Contact  Tamotsu Wiederholt, Juliann Pulse, RN Phone Number: 07/16/2021, 10:09 AM  Patient:Referral for med asst-patient has [employer] insurance with script coverage-already noted cost of xarelto or eliquis-patient already aware.HHC already set up.Monitoring if home 02 needed more than HS which patient already ready receives. Await home 02 order, & qualifying 02 sats if needed can arrange.[facility] already following.  12p-spoke to [employer] rep Tammy about any med asst-there are no indigent funds for med asst-recc patient contact pcp office for samples.Patient informed. -[facility] rep Caryl Pina following for sats within 48hrs of d/c, home 02 order already placed-Ashley can pull up orders & sats from [place] will deliver travel tank to rm prior d/c.    Expected Discharge Plan: [place] Barriers to Discharge: Continued Medical Work up  Expected Discharge Plan and Services Expected Discharge Plan: [place]   Discharge Planning Services: CM Consult   Living arrangements for the past 2 months: Single Family Home Expected Discharge Date: 07/12/21                                     Social Determinants of Health (SDOH) Interventions    Readmission Risk Interventions Readmission Risk Prevention Plan 12/24/2019  Transportation Screening Complete  Some recent data might be hidden

## 2021-07-16 NOTE — Progress Notes (Signed)
Pt continues to refuse CPAP qhs.  Pt encouraged to contact RT should she change her mind. 

## 2021-07-16 NOTE — TOC Benefit Eligibility Note (Addendum)
Patient Teacher, English as a foreign language completed.    The patient is currently admitted and upon discharge could be taking Eliquis Starter Pack.  The current 30 day co-pay is, $45.00.   The patient is currently admitted and upon discharge could be taking Eliquis 5 mg.  The current 30 day co-pay is, $45.00.   The patient is insured through [employer] [place] Patient Advocate Specialist [place] Patient Advocate Team Direct Number: [phone]  Fax: [phone]

## 2021-07-16 NOTE — Progress Notes (Signed)
PROGRESS NOTE    Stephanie Alvarado  ZRA:[id number] DOB: 14-Jan-1941 DOA: 07/06/2021 PCP: Caryl Bis, MD   Chief Complaint  Patient presents with   Abdominal Pain    Brief Narrative:  Stephanie Alvarado is a 81 y.o. female with history of CAD status post stenting, chronic diastolic CHF, hypertension, sleep apnea uses oxygen at home presents to the ER after patient started having severe abdominal pain involving the mid abdomen and right lower quadrant since 2 PM yesterday.  Pain is constant had some episodes of vomiting no blood in the vomitus denies any diarrhea fever or chills.  She's been found to have small bowel diverticulitis.  She'd improved on IV antibiotics.  Plan was for potential discharge on 1/30, but she was hypoxic with ambulation and CT PE showed PE with right heart strain.  She's been started on heparin.  Pulmonary was consulted due to concern for submassive PE.  Currently receiving anticoagulation, expect she'll need 2-3 days anticoagulation with heparin, then can consider transition to doac and discharge.     Assessment & Plan:   Principal Problem:   Pulmonary embolism (HCC) Active Problems:   Thyroid nodule   Essential hypertension   CAD (coronary artery disease)   Diverticulitis   Chronic diastolic CHF (congestive heart failure) (HCC)   Thrombocytopenia (HCC)   Chronic respiratory failure with hypoxia, on home oxygen therapy (HCC)   Hypokalemia   AKI (acute kidney injury) ([place])   Daytime sleepiness   Chest pain   Acute on chronic respiratory failure with hypoxia (HCC)   Opacity of lung on imaging study  #1 acute bilateral submassive PE/right lower extremity DVT -Noted to have possibly been provoked during the hospitalization. -CT angiogram chest with concerns for right heart strain. -2D echo obtained with normal right ventricular systolic function, EF 60 to 54%, grade 1 diastolic dysfunction. -Lower extremity Dopplers done with concerns will right posterior tibial  vein DVT. -BNP noted to be mildly elevated. -Troponin is elevated but downtrending in the setting of PE. -PESI class 3-4 (chronic O2 use at home as needed but generally used at bedtime/OSA.  Patient) -Patient seen in consultation by PCCM and no plans for lytics at this point unless patient decompensates. -Blood pressure was borderline but improved with decreased doses of Imdur and Lopressor.   -Status post Lasix 20 mg IV every 12 hours x2 doses with a urine output of 2.675 L over the past 24 hours.   -We will give another dose of Lasix 20 mg IV every 12 hours x2 doses.   -Transition from IV heparin to Eliquis.   -Check ambulatory sats in the AM.   2.  Opacity of lung on imaging study -Imaging with right lower lobe airspace opacity-due to hemorrhage versus pneumonia versus edema. -??  Pulmonary infarct. -On empiric IV antibiotics which we will continue.  3.  Acute diverticulitis, POA -Noted on presentation CT abdomen and pelvis. -Patient on IV antibiotics and narrowed to Unasyn to complete a 10-day course of antibiotic treatment. -Diet advanced and tolerating a soft diet. -Was followed by general surgery. -Supportive care.  4.  Acute kidney injury -Improved with hydration. -Renal ultrasound negative for hydronephrosis.  5.  Hypokalemia -Repleted.  -Potassium at 4.4.   -Repeat labs in the morning.   6.  Chronic respiratory failure with hypoxia, on home O2 therapy -Patient on oxygen at night for sleep apnea. -Outpatient follow-up with pulmonary.  7.  Daytime sleepiness -VBG with respiratory acidosis but compensated with normal pH. -  Continue CPAP nightly.  8.  Thrombocytopenia, chronic -Stable.  Monitor with anticoagulation.  9.  CAD -Stable. -Due to soft blood pressure decreased Imdur to 15 mg daily, metoprolol to 12.5 mg twice daily. -Outpatient follow-up.  10.  Hypertension -Soft blood pressure improved on decreased dose of Imdur and decrease dose of metoprolol.    -Continue to hold Norvasc.   -Monitor on IV Lasix.   11.  Right-sided chest pain -Felt likely musculoskeletal in nature as was tender to palpation. -Chest pain not consistent with ACS. -Chest x-ray with no active infiltrate. -Patient with bilateral PE which may be contributing in part to right-sided chest pain. -Continue anticoagulation. -Symptomatic treatment.  12.  Thyroid nodule -Noted on CT scan, appears to have been biopsied 04/25/2022. -Outpatient follow-up.  13.  Wheezing -Patient with some wheezing noted on examination on 07/15/2021. -Patient prior tobacco use. -Concern for also possible volume overload. -Status post Lasix 20 mg IV every 12 hours x2 doses with a urine output of 2.675 L over the past 24 hours. -Patient received IV Solu-Medrol and scheduled duo nebs with clinical improvement. -Continue Lasix 20 mg IV every 12 hours x2 more doses.    DVT prophylaxis: Heparin>>>> Eliquis. Code Status: Full Family Communication: Updated patient.  No family at bedside. Disposition:   Status is: Inpatient Remains inpatient appropriate because: Severity of illness  Planned Discharge Destination: Home with Home Health         Consultants:  PCCM: Dr. Patsey Berthold 07/12/2021 General surgery: Dr.Gerkin 07/07/2021  Procedures:  CT abdomen and pelvis 07/06/2021 CT angiogram chest 07/12/2021 Chest x-ray 07/08/2021, 07/11/2021, 07/14/2021 Renal ultrasound 07/08/2021 2D echo 07/13/2021 Bilateral lower extremity Dopplers 07/13/2021   Antimicrobials:   Augmentin 1/25 /2023x1 dose. -IV Unasyn 07/12/2021>>>>> -IV Zosyn 07/07/2021 x 1 dose.  Subjective: Sitting up in bed.  Complain of some clear congestion overnight.  No significant chest pain.  Wheezing improving.  Still noted to be hypoxic on ambulation.  Cannot tell any significant difference with shortness of breath.  No significant bleeding noted.  Tolerating current diet.    Objective: Vitals:   07/15/21 1323 07/15/21 1958  07/16/21 0500 07/16/21 0752  BP: 134/67  133/80   Pulse: 73 81 71   Resp: 18 16    Temp: 97.7 F (36.5 C) 97.8 F (36.6 C) 98 F (36.7 C)   TempSrc: Oral Oral Oral   SpO2: 97% 98% 95% 96%  Weight:      Height:        Intake/Output Summary (Last 24 hours) at 07/16/2021 1218 Last data filed at 07/16/2021 1030 Gross per 24 hour  Intake 1097.13 ml  Output 2675 ml  Net -1577.87 ml    Filed Weights   07/06/21 2142  Weight: 90.8 kg    Examination:  General exam: NAD. Respiratory system: Right basilar crackles.  No wheezing.  Fair air movement.  Speaking in full sentences.  Cardiovascular system: RRR no murmurs rubs or gallops.  No JVD.  No lower extremity edema. Gastrointestinal system: Abdomen is soft, nontender, nondistended, positive bowel sounds.  No rebound.  No guarding.   Central nervous system: Alert and oriented. No focal neurological deficits. Extremities: Symmetric 5 x 5 power. Skin: No rashes, lesions or ulcers Psychiatry: Judgement and insight appear normal. Mood & affect appropriate.     Data Reviewed: I have personally reviewed following labs and imaging studies  CBC: Recent Labs  Lab 07/10/21 0542 07/11/21 0518 07/12/21 4098 07/13/21 0207 07/14/21 0458 07/15/21 0519 07/16/21 0438  WBC  9.5 9.7 10.0 9.4 7.4 7.4 10.2  NEUTROABS 6.6 6.5  --   --  4.9  --   --   HGB 12.5 12.2 11.8* 11.8* 11.1* 11.1* 11.3*  HCT 40.7 39.6 38.3 38.1 36.1 36.5 36.3  MCV 90.8 89.6 89.3 89.6 89.6 90.1 88.5  PLT 98* 95* 90* 93* 107* 113* 133*     Basic Metabolic Panel: Recent Labs  Lab 07/10/21 0542 07/11/21 0518 07/12/21 0821 07/13/21 0817 07/14/21 0458 07/15/21 0519 07/16/21 0438  NA 134* 132* 133* 135 137 136 135  K 4.3 3.9 4.1 3.7 3.6 3.6 4.4  CL 100 97* 98 100 103 105 102  CO2 29 30 28 29 27 27 28   GLUCOSE 105* 109* 103* 99 101* 103* 148*  BUN 12 15 14 15 14 10 12   CREATININE 0.87 0.93 0.85 0.86 0.71 0.76 0.76  CALCIUM 9.2 9.5 9.2 9.3 9.4 9.4 9.8  MG 2.0  2.1  --   --   --  2.0 2.1  PHOS 2.7 3.0  --   --   --   --   --      GFR: Estimated Creatinine Clearance: 62.7 mL/min (by C-G formula based on SCr of 0.76 mg/dL).  Liver Function Tests: Recent Labs  Lab 07/10/21 0542 07/11/21 0518 07/13/21 0817  AST 17 13* 16  ALT 12 11 11   ALKPHOS 52 51 47  BILITOT 1.0 0.9 0.6  PROT 5.9* 6.1* 5.9*  ALBUMIN 3.0* 2.9* 2.6*     CBG: Recent Labs  Lab 07/15/21 0736 07/15/21 1230 07/15/21 1612 07/16/21 0733 07/16/21 1128  GLUCAP 88 116* 187* 127* 144*      Recent Results (from the past 240 hour(s))  Resp Panel by RT-PCR (Flu A&B, Covid) Nasopharyngeal Swab     Status: None   Collection Time: 07/07/21  2:10 AM   Specimen: Nasopharyngeal Swab; Nasopharyngeal(NP) swabs in vial transport medium  Result Value Ref Range Status   SARS Coronavirus 2 by RT PCR NEGATIVE NEGATIVE Final    Comment: (NOTE) SARS-CoV-2 target nucleic acids are NOT DETECTED.  The SARS-CoV-2 RNA is generally detectable in upper respiratory specimens during the acute phase of infection. The lowest concentration of SARS-CoV-2 viral copies this assay can detect is 138 copies/mL. A negative result does not preclude SARS-Cov-2 infection and should not be used as the sole basis for treatment or other patient management decisions. A negative result may occur with  improper specimen collection/handling, submission of specimen other than nasopharyngeal swab, presence of viral mutation(s) within the areas targeted by this assay, and inadequate number of viral copies(<138 copies/mL). A negative result must be combined with clinical observations, patient history, and epidemiological information. The expected result is Negative.  Fact Sheet for Patients:  EntrepreneurPulse.com.au  Fact Sheet for Healthcare Providers:  IncredibleEmployment.be  This test is no t yet approved or cleared by the [place] FDA and  has been authorized  for detection and/or diagnosis of SARS-CoV-2 by FDA under an Emergency Use Authorization (EUA). This EUA will remain  in effect (meaning this test can be used) for the duration of the COVID-19 declaration under Section 564(b)(1) of the Act, 21 U.S.C.section 360bbb-3(b)(1), unless the authorization is terminated  or revoked sooner.       Influenza A by PCR NEGATIVE NEGATIVE Final   Influenza B by PCR NEGATIVE NEGATIVE Final    Comment: (NOTE) The Xpert Xpress SARS-CoV-2/FLU/RSV plus assay is intended as an aid in the diagnosis of influenza from Nasopharyngeal swab  specimens and should not be used as a sole basis for treatment. Nasal washings and aspirates are unacceptable for Xpert Xpress SARS-CoV-2/FLU/RSV testing.  Fact Sheet for Patients: EntrepreneurPulse.com.au  Fact Sheet for Healthcare Providers: IncredibleEmployment.be  This test is not yet approved or cleared by the [place] FDA and has been authorized for detection and/or diagnosis of SARS-CoV-2 by FDA under an Emergency Use Authorization (EUA). This EUA will remain in effect (meaning this test can be used) for the duration of the COVID-19 declaration under Section 564(b)(1) of the Act, 21 U.S.C. section 360bbb-3(b)(1), unless the authorization is terminated or revoked.  Performed at [facility], [place] [address], [place], [place] [address]           Radiology Studies: No results found.      Scheduled Meds:  apixaban  10 mg Oral BID   Followed by   Derrill Memo ON 07/23/2021] apixaban  5 mg Oral BID   buPROPion  300 mg Oral Daily   diclofenac Sodium  2 g Topical QID   feeding supplement  1 Container Oral TID BM   fluticasone  2 spray Each Nare Daily   furosemide  20 mg Intravenous Q12H   gabapentin  600 mg Oral TID   ipratropium-albuterol  3 mL Nebulization BID   isosorbide mononitrate  15 mg Oral QHS   [START ON 07/17/2021] linaclotide  145 mcg  Oral QAC breakfast   loratadine  10 mg Oral Daily   metoprolol tartrate  12.5 mg Oral BID   pantoprazole  40 mg Oral BID   rosuvastatin  10 mg Oral QHS   Continuous Infusions:  sodium chloride 10 mL/hr at 07/15/21 0932   ampicillin-sulbactam (UNASYN) IV 3 g (07/16/21 1008)     LOS: 9 days    Time spent: 45 minutes    Irine Seal, MD [facility]   To contact the attending provider between 7A-7P or the covering provider during after hours 7P-7A, please log into the web site [email or web address] and access using universal [place] password for that web site. If you do not have the password, please call the hospital operator.  07/16/2021, 12:18 PM

## 2021-07-16 NOTE — Progress Notes (Addendum)
ANTICOAGULATION CONSULT NOTE - Initial Consult  Pharmacy Consult for Heparin Indication: pulmonary embolus/DVT  Allergies  Allergen Reactions   Codeine Itching    REACTION: itch    Patient Measurements: Height: 5\' 7"  (170.2 cm) Weight: 90.8 kg (200 lb 2.8 oz) IBW/kg (Calculated) : 61.6 Heparin Dosing Weight: 83.2 kg  Vital Signs: Temp: 98 F (36.7 C) (02/03 0500) Temp Source: Oral (02/03 0500) BP: 133/80 (02/03 0500) Pulse Rate: 71 (02/03 0500)  Labs: Recent Labs    07/13/21 1316 07/13/21 2006 07/14/21 0458 07/15/21 0519 07/16/21 0438  HGB  --    < > 11.1* 11.1* 11.3*  HCT  --   --  36.1 36.5 36.3  PLT  --   --  107* 113* 133*  HEPARINUNFRC  --    < > 0.53 0.53 0.70  CREATININE  --   --  0.71 0.76 0.76  TROPONINIHS 42*  --   --   --   --    < > = values in this interval not displayed.     Estimated Creatinine Clearance: 62.7 mL/min (by C-G formula based on SCr of 0.76 mg/dL).   Medical History: Past Medical History:  Diagnosis Date   Achilles tendon rupture, right, initial encounter    Allergic rhinitis, cause unspecified    Anemia, unspecified    Anxiety state, unspecified    Atherosclerosis of aorta ([place])    TEE, June, 2010, grade 4 aortic arch atherosclerosis , Dr. Aundra Dubin   CAD (coronary artery disease)    DES and circumflex 2006 /  DES to LAD 2011   Carotid artery disease ([place])    Doppler, November, 2011, stable, 0-39% bilateral, turbulent flow left subclavian   Chronic diastolic heart failure ([place])    Cyst of thyroid    Diverticulosis of colon (without mention of hemorrhage)    Dysphasia    Possibly esophageal stricture   Esophageal reflux    Hiatal hernia    Hyperlipemia    Hypertension    Lumbago    Lumbar disc disease    MVP (mitral valve prolapse)    Neuropathy    Nonspecific abnormal finding in stool contents    Obesity, unspecified    Osteoarthrosis, unspecified whether generalized or localized, unspecified site    Osteopenia     Other diseases of lung, not elsewhere classified    Peripheral vascular disease, unspecified ([place])    Personal history of colonic polyps    ADENOMATOUS POLYP   PFO (patent foramen ovale)    Patient had a very small PFO by TEE 2010.  This was seen only by bubble analysis during Valsalva   PONV (postoperative nausea and vomiting)    Sleep apnea    uses CPAP nightly   Sleep related hypoventilation/hypoxemia in conditions classifiable elsewhere    Stroke [facility]) 11/2008   Treated with TPA? 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 Victorino Dike.   Unspecified cerebral artery occlusion with cerebral infarction    Unspecified venous (peripheral) insufficiency    Vitamin B 12 deficiency     Medications:  Scheduled:   buPROPion  300 mg Oral Daily   diclofenac Sodium  2 g Topical QID   feeding supplement  1 Container Oral TID BM   fluticasone  2 spray Each Nare Daily   gabapentin  600 mg Oral TID   ipratropium-albuterol  3 mL Nebulization TID   isosorbide mononitrate  15 mg Oral QHS   loratadine  10 mg Oral Daily   metoprolol tartrate  12.5 mg Oral BID   pantoprazole  40 mg Oral BID   rosuvastatin  10 mg Oral QHS   Infusions:   sodium chloride 10 mL/hr at 07/15/21 0932   ampicillin-sulbactam (UNASYN) IV 3 g (07/16/21 0351)   heparin 1,400 Units/hr (07/15/21 1437)   PRN: sodium chloride, acetaminophen, lip balm, oxyCODONE  Assessment: 81 yo female admitted with abdominal pain, found to have diverticulitis.  On 1/30, developed hypoxia on ambulation and CTa showed PE with right heart strain.  Dopplers on 1/31 showed acute DVT involving the R posterior tibial veins.  Pharmacy consulted to dose IV heparin. Of note, has thrombocytopenia which is chronic, d/w MD, ok to proceed with heparin since no bleeding currently reported.  07/16/21 Daily Heparin level = 0.70 (therapeutic but high end of  goal) with heparin infusing at 1400 units/hr Hgb = 11.3, PLTC 133k (stable) Documented nose bleeding overnight by RRT - no other bleeding noted per discussion with RN  Goal of Therapy:  Heparin level 0.3-0.7 units/ml Monitor platelets by anticoagulation protocol: Yes   Plan:  Reduce heparin infusion to 1350 units/hr Follow daily CBC and heparin level Monitor for signs & symptoms of bleeding F/u ability to transition to oral anticoagulation  Dimple Nanas, PharmD 07/16/2021 7:08 AM   ADDENDUM: - Switching Heparin drip to Eliquis  - Start Eliquis 10mg  PO BID x 7 days, followed by 5mg  PO BID thereafter - Discontinue heparin drip at time of Eliquis administration > communicated w/ RN - Will provide education and coupon prior to discharge  Dimple Nanas, PharmD 07/16/2021 8:47 AM

## 2021-07-16 NOTE — Discharge Instructions (Addendum)
Information on my medicine - ELIQUIS (apixaban)  This medication education was reviewed with me or my healthcare representative as part of my discharge preparation.  The pharmacist that spoke with me during my hospital stay was:  Avis Epley, Student-PharmD  Why was Eliquis prescribed for you? Eliquis was prescribed to treat blood clots that may have been found in the veins of your legs (deep vein thrombosis) or in your lungs (pulmonary embolism) and to reduce the risk of them occurring again.  What do You need to know about Eliquis ? The starting dose is 10 mg (two 5 mg tablets) taken TWICE daily for the FIRST SEVEN (7) DAYS, then on (enter date)  07/23/21  the dose is reduced to ONE 5 mg tablet taken TWICE daily.  Eliquis may be taken with or without food.   Try to take the dose about the same time in the morning and in the evening. If you have difficulty swallowing the tablet whole please discuss with your pharmacist how to take the medication safely.  Take Eliquis exactly as prescribed and DO NOT stop taking Eliquis without talking to the doctor who prescribed the medication.  Stopping may increase your risk of developing a new blood clot.  Refill your prescription before you run out.  After discharge, you should have regular check-up appointments with your healthcare provider that is prescribing your Eliquis.    What do you do if you miss a dose? If a dose of ELIQUIS is not taken at the scheduled time, take it as soon as possible on the same day and twice-daily administration should be resumed. The dose should not be doubled to make up for a missed dose.  Important Safety Information A possible side effect of Eliquis is bleeding. You should call your healthcare provider right away if you experience any of the following: Bleeding from an injury or your nose that does not stop. Unusual colored urine (red or dark brown) or unusual colored stools (red or black). Unusual  bruising for unknown reasons. A serious fall or if you hit your head (even if there is no bleeding).  Some medicines may interact with Eliquis and might increase your risk of bleeding or clotting while on Eliquis. To help avoid this, consult your healthcare provider or pharmacist prior to using any new prescription or non-prescription medications, including herbals, vitamins, non-steroidal anti-inflammatory drugs (NSAIDs) and supplements.  This website has more information on Eliquis (apixaban): http://www.eliquis.com/eliquis/home

## 2021-07-16 NOTE — Progress Notes (Signed)
Physical Therapy Treatment Patient Details Name: Stephanie Alvarado MRN: [id number] DOB: 1941/04/06 Today's Date: 07/16/2021   History of Present Illness Pt admitted from home 2* abdominal pain and dx with diverticulitis.  Pt found to have PE this hospitalization.  Pt with hx of CAD, carotid artery disease, MVP, neuropathy, PVD, CVA, back surgery, and bil TKR    PT Comments    Pt assisted to bathroom and then ambulated in hallway.  Pt tolerated improved distance however required a few standing rest breaks due to dyspnea.  Pt ambulated to bathroom on room air and SPO2 dropped to 83% so applied 2L O2 [place] for ambulation in hallway (see mobility section below for more details).  Pt returned to bed as she had a visitor come to room upon end of ambulation and wished for him to sit in recliner.     Recommendations for follow up therapy are one component of a multi-disciplinary discharge planning process, led by the attending physician.  Recommendations may be updated based on patient status, additional functional criteria and insurance authorization.  Follow Up Recommendations  Home health PT     Assistance Recommended at Discharge Frequent or constant Supervision/Assistance  Patient can return home with the following A little help with walking and/or transfers;A little help with bathing/dressing/bathroom;Assistance with cooking/housework;Assist for transportation   Equipment Recommendations  None recommended by PT    Recommendations for Other Services       Precautions / Restrictions Precautions Precautions: Fall Precaution Comments: monitor sats     Mobility  Bed Mobility Overal bed mobility: Needs Assistance Bed Mobility: Supine to Sit, Sit to Supine     Supine to sit: Min guard, HOB elevated Sit to supine: Min guard, HOB elevated   General bed mobility comments: increased time and effort, required elevated HOB and use of bed rails    Transfers Overall transfer level: Needs  assistance Equipment used: Rolling walker (2 wheels) Transfers: Sit to/from Stand Sit to Stand: Min guard           General transfer comment: verbal cues for hand placement    Ambulation/Gait Ambulation/Gait assistance: Min guard Gait Distance (Feet): 240 Feet Assistive device: Rolling walker (2 wheels) Gait Pattern/deviations: Step-through pattern, Trunk flexed, Decreased stride length Gait velocity: decr     General Gait Details: verbal cues for RW positioning, posture; required at least 3 standing rest breaks due to dyspnea, SpO2 93-96% on 2L O2 [place]   Stairs             Wheelchair Mobility    Modified Rankin (Stroke Patients Only)       Balance                                            Cognition Arousal/Alertness: Awake/alert Behavior During Therapy: WFL for tasks assessed/performed Overall Cognitive Status: Within Functional Limits for tasks assessed                                          Exercises      General Comments        Pertinent Vitals/Pain Pain Assessment Pain Assessment: No/denies pain    Home Living  Prior Function            PT Goals (current goals can now be found in the care plan section) Progress towards PT goals: Progressing toward goals    Frequency    Min 3X/week      PT Plan Current plan remains appropriate    Co-evaluation              AM-PAC PT "6 Clicks" Mobility   Outcome Measure  Help needed turning from your back to your side while in a flat bed without using bedrails?: A Lot Help needed moving from lying on your back to sitting on the side of a flat bed without using bedrails?: A Lot Help needed moving to and from a bed to a chair (including a wheelchair)?: A Lot Help needed standing up from a chair using your arms (e.g., wheelchair or bedside chair)?: A Lot Help needed to walk in hospital room?: A Lot Help needed climbing  3-5 steps with a railing? : A Lot 6 Click Score: 12    End of Session Equipment Utilized During Treatment: Gait belt;Oxygen Activity Tolerance: Patient tolerated treatment well Patient left: in bed;with call bell/phone within reach Nurse Communication: Mobility status PT Visit Diagnosis: Muscle weakness (generalized) (M62.81);Difficulty in walking, not elsewhere classified (R26.2)     Time: 2751-7001 PT Time Calculation (min) (ACUTE ONLY): 30 min  Charges:  $Gait Training: 23-37 mins                    Jannette Spanner PT, DPT Acute Rehabilitation Services Pager: [phone] Office: [place] 07/16/2021, 3:55 PM

## 2021-07-17 LAB — CBC
HCT: 38.5 % (ref 36.0–46.0)
Hemoglobin: 11.7 g/dL — ABNORMAL LOW (ref 12.0–15.0)
MCH: 27.1 pg (ref 26.0–34.0)
MCHC: 30.4 g/dL (ref 30.0–36.0)
MCV: 89.3 fL (ref 80.0–100.0)
Platelets: 160 10*3/uL (ref 150–400)
RBC: 4.31 MIL/uL (ref 3.87–5.11)
RDW: 18.1 % — ABNORMAL HIGH (ref 11.5–15.5)
WBC: 9.6 10*3/uL (ref 4.0–10.5)
nRBC: 0 % (ref 0.0–0.2)

## 2021-07-17 LAB — BASIC METABOLIC PANEL
Anion gap: 7 (ref 5–15)
BUN: 17 mg/dL (ref 8–23)
CO2: 31 mmol/L (ref 22–32)
Calcium: 10.2 mg/dL (ref 8.9–10.3)
Chloride: 99 mmol/L (ref 98–111)
Creatinine, Ser: 0.99 mg/dL (ref 0.44–1.00)
GFR, Estimated: 57 mL/min — ABNORMAL LOW (ref >60)
Glucose, Bld: 95 mg/dL (ref 70–99)
Potassium: 3.6 mmol/L (ref 3.5–5.1)
Sodium: 137 mmol/L (ref 135–145)

## 2021-07-17 LAB — GLUCOSE, CAPILLARY: Glucose-Capillary: 95 mg/dL (ref 70–99)

## 2021-07-17 LAB — MAGNESIUM: Magnesium: 1.9 mg/dL (ref 1.7–2.4)

## 2021-07-17 MED ORDER — FUROSEMIDE 10 MG/ML IJ SOLN
20.0000 mg | Freq: Once | INTRAMUSCULAR | Status: AC
  Administered 2021-07-17: 20 mg via INTRAVENOUS
  Filled 2021-07-17: qty 2

## 2021-07-17 MED ORDER — POTASSIUM CHLORIDE CRYS ER 20 MEQ PO TBCR
40.0000 meq | EXTENDED_RELEASE_TABLET | Freq: Once | ORAL | Status: AC
  Administered 2021-07-17: 40 meq via ORAL
  Filled 2021-07-17: qty 2

## 2021-07-17 NOTE — Progress Notes (Signed)
Pt currently refusing CPAP QHS.

## 2021-07-17 NOTE — Progress Notes (Signed)
While lying in bed patient oxygen levels reading 85-88% on RA.  Placed back on 2L oxygen.Marland Kitchenoxygen 92% on 2L.

## 2021-07-17 NOTE — Progress Notes (Signed)
PROGRESS NOTE    Stephanie Alvarado  NGE:[id number] DOB: September 23, 1940 DOA: 07/06/2021 PCP: Caryl Bis, MD   Chief Complaint  Patient presents with   Abdominal Pain    Brief Narrative:  Stephanie Alvarado is a 81 y.o. female with history of CAD status post stenting, chronic diastolic CHF, hypertension, sleep apnea uses oxygen at home presents to the ER after patient started having severe abdominal pain involving the mid abdomen and right lower quadrant since 2 PM yesterday.  Pain is constant had some episodes of vomiting no blood in the vomitus denies any diarrhea fever or chills.  She's been found to have small bowel diverticulitis.  She'd improved on IV antibiotics.  Plan was for potential discharge on 1/30, but she was hypoxic with ambulation and CT PE showed PE with right heart strain.  She's been started on heparin.  Pulmonary was consulted due to concern for submassive PE.  Currently receiving anticoagulation, expect she'll need 2-3 days anticoagulation with heparin, then can consider transition to doac and discharge.     Assessment & Plan:   Principal Problem:   Pulmonary embolism (HCC) Active Problems:   Thyroid nodule   Essential hypertension   CAD (coronary artery disease)   Diverticulitis   Chronic diastolic CHF (congestive heart failure) (HCC)   Thrombocytopenia (HCC)   Chronic respiratory failure with hypoxia, on home oxygen therapy (HCC)   Hypokalemia   AKI (acute kidney injury) ([place])   Daytime sleepiness   Chest pain   Acute on chronic respiratory failure with hypoxia (HCC)   Opacity of lung on imaging study  #1 acute bilateral submassive PE/right lower extremity DVT -Noted to have possibly been provoked during the hospitalization. -CT angiogram chest with concerns for right heart strain. -2D echo obtained with normal right ventricular systolic function, EF 60 to 40%, grade 1 diastolic dysfunction. -Lower extremity Dopplers done with concerns will right posterior tibial  vein DVT. -BNP noted to be mildly elevated. -Troponin is elevated but downtrending in the setting of PE. -PESI class 3-4 (chronic O2 use at home as needed but generally used at bedtime/OSA.  Patient) -Patient seen in consultation by PCCM and no plans for lytics at this point unless patient decompensates. -Blood pressure was borderline but improved with decreased doses of Imdur and Lopressor.   -Status post Lasix 20 mg IV every 12 hours x4 doses with a urine output of 3.2 L over the past 24 hours.   -We will give another dose of Lasix 20 mg IV x1.   -Transitioned from IV heparin to Eliquis.   -Check ambulatory sats in the AM.   2.  Opacity of lung on imaging study -Imaging with right lower lobe airspace opacity-due to hemorrhage versus pneumonia versus edema. -??  Pulmonary infarct. -On empiric IV antibiotics which we will continue.  3.  Acute diverticulitis, POA -Noted on presentation CT abdomen and pelvis. -Patient on IV antibiotics and narrowed to Unasyn to complete a 10-day course of antibiotic treatment. -Diet advanced and tolerating a soft diet. -Was followed by general surgery. -Supportive care.  4.  Acute kidney injury -Improved with hydration. -Renal ultrasound negative for hydronephrosis.  5.  Hypokalemia -Repleted.  -Potassium at 3.6. -We will give K-Dur 40 mEq p.o. x 1. -Repeat labs in the morning.   6.  Chronic respiratory failure with hypoxia, on home O2 therapy -Patient on oxygen at night for sleep apnea. -Outpatient follow-up with pulmonary.  7.  Daytime sleepiness -VBG with respiratory acidosis but compensated  with normal pH. -Continue CPAP nightly.  8.  Thrombocytopenia, chronic -Stable.  Monitor with anticoagulation.  9.  CAD -Stable. -Due to soft blood pressure decreased Imdur to 15 mg daily, metoprolol to 12.5 mg twice daily. -Outpatient follow-up.  10.  Hypertension -Soft blood pressure improved on decreased dose of Imdur and decrease dose of  metoprolol.   -Continue to hold Norvasc.   -Monitor on IV Lasix.   11.  Right-sided chest pain -Felt likely musculoskeletal in nature as was tender to palpation. -Chest pain not consistent with ACS. -Chest x-ray with no active infiltrate. -Patient with bilateral PE which may be contributing in part to right-sided chest pain. -Continue anticoagulation. -Symptomatic treatment.  12.  Thyroid nodule -Noted on CT scan, appears to have been biopsied 04/25/2022. -Outpatient follow-up.  13.  Wheezing -Patient with some wheezing noted on examination on 07/15/2021. -Patient prior tobacco use. -Concern for also possible volume overload. -Status post IV Lasix with urine output of 3.2 L over the past 24 hours and -4.2 L during his hospitalization.   -Status post IV Solu-Medrol and scheduled duo nebs.   -Lasix 20 mg IV x1. -Clinical improvement.     DVT prophylaxis: Heparin>>>> Eliquis. Code Status: Full Family Communication: Updated patient.  No family at bedside. Disposition:   Status is: Inpatient Remains inpatient appropriate because: Severity of illness  Planned Discharge Destination: Home with Home Health         Consultants:  PCCM: Dr. Patsey Berthold 07/12/2021 General surgery: Dr.Gerkin 07/07/2021  Procedures:  CT abdomen and pelvis 07/06/2021 CT angiogram chest 07/12/2021 Chest x-ray 07/08/2021, 07/11/2021, 07/14/2021 Renal ultrasound 07/08/2021 2D echo 07/13/2021 Bilateral lower extremity Dopplers 07/13/2021   Antimicrobials:   Augmentin 1/25 /2023x1 dose. -IV Unasyn 07/12/2021>>>>> -IV Zosyn 07/07/2021 x 1 dose.  Subjective: Sitting up in bed.  Feels shortness of breath is improving.  Denies any chest pain.  Feels wheezing is improving.  Feeling somewhat anxious.  Tolerating current diet.  No bleeding.    Objective: Vitals:   07/16/21 2016 07/17/21 0535 07/17/21 0802 07/17/21 1009  BP: 135/72 112/65  110/63  Pulse: 76 65  82  Resp:  18    Temp: 98.2 F (36.8 C) 97.8 F  (36.6 C)  97.9 F (36.6 C)  TempSrc: Oral Oral  Oral  SpO2:  92% 95% 91%  Weight:      Height:        Intake/Output Summary (Last 24 hours) at 07/17/2021 1139 Last data filed at 07/17/2021 0643 Gross per 24 hour  Intake 240 ml  Output 3200 ml  Net -2960 ml    Filed Weights   07/06/21 2142  Weight: 90.8 kg    Examination:  General exam: NAD. Respiratory system: Decreasing right basilar crackles.  No wheezing.  Fair air movement.  Speaking in full sentences.  Cardiovascular system: Regular rate rhythm no murmurs rubs or gallops.  No JVD.  No lower extremity edema. Gastrointestinal system: Abdomen is soft, nontender, nondistended, positive bowel sounds.  No rebound.  No guarding.  Central nervous system: Alert and oriented. No focal neurological deficits. Extremities: Symmetric 5 x 5 power. Skin: No rashes, lesions or ulcers Psychiatry: Judgement and insight appear normal. Mood & affect appropriate.     Data Reviewed: I have personally reviewed following labs and imaging studies  CBC: Recent Labs  Lab 07/11/21 0518 07/12/21 0821 07/13/21 0207 07/14/21 0458 07/15/21 0519 07/16/21 0438 07/17/21 0552  WBC 9.7   < > 9.4 7.4 7.4 10.2 9.6  NEUTROABS 6.5  --   --  4.9  --   --   --   HGB 12.2   < > 11.8* 11.1* 11.1* 11.3* 11.7*  HCT 39.6   < > 38.1 36.1 36.5 36.3 38.5  MCV 89.6   < > 89.6 89.6 90.1 88.5 89.3  PLT 95*   < > 93* 107* 113* 133* 160   < > = values in this interval not displayed.     Basic Metabolic Panel: Recent Labs  Lab 07/11/21 0518 07/12/21 0821 07/13/21 0817 07/14/21 0458 07/15/21 0519 07/16/21 0438 07/17/21 1042  NA 132*   < > 135 137 136 135 137  K 3.9   < > 3.7 3.6 3.6 4.4 3.6  CL 97*   < > 100 103 105 102 99  CO2 30   < > 29 27 27 28 31   GLUCOSE 109*   < > 99 101* 103* 148* 95  BUN 15   < > 15 14 10 12 17   CREATININE 0.93   < > 0.86 0.71 0.76 0.76 0.99  CALCIUM 9.5   < > 9.3 9.4 9.4 9.8 10.2  MG 2.1  --   --   --  2.0 2.1 1.9  PHOS  3.0  --   --   --   --   --   --    < > = values in this interval not displayed.     GFR: Estimated Creatinine Clearance: 50.7 mL/min (by C-G formula based on SCr of 0.99 mg/dL).  Liver Function Tests: Recent Labs  Lab 07/11/21 0518 07/13/21 0817  AST 13* 16  ALT 11 11  ALKPHOS 51 47  BILITOT 0.9 0.6  PROT 6.1* 5.9*  ALBUMIN 2.9* 2.6*     CBG: Recent Labs  Lab 07/15/21 1230 07/15/21 1612 07/16/21 0733 07/16/21 1128 07/17/21 0721  GLUCAP 116* 187* 127* 144* 95      No results found for this or any previous visit (from the past 240 hour(s)).         Radiology Studies: No results found.      Scheduled Meds:  apixaban  10 mg Oral BID   Followed by   Derrill Memo ON 07/23/2021] apixaban  5 mg Oral BID   buPROPion  300 mg Oral Daily   diclofenac Sodium  2 g Topical QID   feeding supplement  1 Container Oral TID BM   fluticasone  2 spray Each Nare Daily   furosemide  20 mg Intravenous Once   gabapentin  600 mg Oral TID   ipratropium-albuterol  3 mL Nebulization BID   isosorbide mononitrate  15 mg Oral QHS   linaclotide  145 mcg Oral QAC breakfast   loratadine  10 mg Oral Daily   metoprolol tartrate  12.5 mg Oral BID   pantoprazole  40 mg Oral BID   potassium chloride  40 mEq Oral Once   rosuvastatin  10 mg Oral QHS   Continuous Infusions:  sodium chloride 10 mL/hr at 07/15/21 0932     LOS: 10 days    Time spent: 58 minutes    Irine Seal, MD [facility]   To contact the attending provider between 7A-7P or the covering provider during after hours 7P-7A, please log into the web site [email or web address] and access using universal [place] password for that web site. If you do not have the password, please call the hospital operator.  07/17/2021, 11:39 AM

## 2021-07-17 NOTE — Progress Notes (Signed)
SATURATION QUALIFICATIONS: (This note is used to comply with regulatory documentation for home oxygen)  Patient Saturations on Room Air at Rest = 94%  Patient Saturations on Room Air while Ambulating = 92%  Patient Saturations on 0 Liters of oxygen while Ambulating = %  Please briefly explain why patient needs home oxygen:  Patients saturations remained in the 90's on room air while ambulating.  Lowest saturation recorded was 92%.

## 2021-07-17 NOTE — Progress Notes (Signed)
°   07/17/21 1200  Oxygen Therapy  SpO2 93 %  O2 Device Room Air  Patient Activity (if Appropriate) Ambulating  Mobility  Activity Ambulated with assistance in hallway  Level of Assistance Standby assist, set-up cues, supervision of patient - no hands on  Assistive Device Front wheel walker  Distance Ambulated (ft) 200 ft  Activity Response Tolerated well  $Mobility charge 1 Mobility   Pt agreeable to mobilize this morning. Ambulated about 284ft in hall with RW, tolerated well. No complaints. Left pt in bed, call bell at side. RN/NT notified of session.   [place] [facility] Office: [phone]

## 2021-07-18 LAB — BASIC METABOLIC PANEL
Anion gap: 9 (ref 5–15)
BUN: 20 mg/dL (ref 8–23)
CO2: 28 mmol/L (ref 22–32)
Calcium: 9.6 mg/dL (ref 8.9–10.3)
Chloride: 99 mmol/L (ref 98–111)
Creatinine, Ser: 1 mg/dL (ref 0.44–1.00)
GFR, Estimated: 56 mL/min — ABNORMAL LOW (ref >60)
Glucose, Bld: 87 mg/dL (ref 70–99)
Potassium: 4.2 mmol/L (ref 3.5–5.1)
Sodium: 136 mmol/L (ref 135–145)

## 2021-07-18 LAB — CBC
HCT: 37.8 % (ref 36.0–46.0)
Hemoglobin: 11.6 g/dL — ABNORMAL LOW (ref 12.0–15.0)
MCH: 27.4 pg (ref 26.0–34.0)
MCHC: 30.7 g/dL (ref 30.0–36.0)
MCV: 89.4 fL (ref 80.0–100.0)
Platelets: 171 10*3/uL (ref 150–400)
RBC: 4.23 MIL/uL (ref 3.87–5.11)
RDW: 18.2 % — ABNORMAL HIGH (ref 11.5–15.5)
WBC: 8.2 10*3/uL (ref 4.0–10.5)
nRBC: 0 % (ref 0.0–0.2)

## 2021-07-18 LAB — MAGNESIUM: Magnesium: 2.1 mg/dL (ref 1.7–2.4)

## 2021-07-18 NOTE — Progress Notes (Signed)
PROGRESS NOTE    VEDANSHI MASSARO  QVZ:[id number] DOB: 11-22-1940 DOA: 07/06/2021 PCP: Caryl Bis, MD   Chief Complaint  Patient presents with   Abdominal Pain    Brief Narrative:  Stephanie Alvarado is a 81 y.o. female with history of CAD status post stenting, chronic diastolic CHF, hypertension, sleep apnea uses oxygen at home presents to the ER after patient started having severe abdominal pain involving the mid abdomen and right lower quadrant since 2 PM yesterday.  Pain is constant had some episodes of vomiting no blood in the vomitus denies any diarrhea fever or chills.  She's been found to have small bowel diverticulitis.  She'd improved on IV antibiotics.  Plan was for potential discharge on 1/30, but she was hypoxic with ambulation and CT PE showed PE with right heart strain.  She's been started on heparin.  Pulmonary was consulted due to concern for submassive PE.  Currently receiving anticoagulation, expect she'll need 2-3 days anticoagulation with heparin, then can consider transition to doac and discharge.     Assessment & Plan:   Principal Problem:   Pulmonary embolism (HCC) Active Problems:   Thyroid nodule   Essential hypertension   CAD (coronary artery disease)   Diverticulitis   Chronic diastolic CHF (congestive heart failure) (HCC)   Thrombocytopenia (HCC)   Chronic respiratory failure with hypoxia, on home oxygen therapy (HCC)   Hypokalemia   AKI (acute kidney injury) ([place])   Daytime sleepiness   Chest pain   Acute on chronic respiratory failure with hypoxia (HCC)   Opacity of lung on imaging study  1 acute bilateral submassive PE/right lower extremity DVT -Noted to have possibly been provoked during the hospitalization. -CT angiogram chest with concerns for right heart strain. -2D echo obtained with normal right ventricular systolic function, EF 60 to 32%, grade 1 diastolic dysfunction. -Lower extremity Dopplers done with concerns will right posterior tibial  vein DVT. -BNP noted to be mildly elevated. -Troponin is elevated but downtrending in the setting of PE. -PESI class 3-4 (chronic O2 use at home as needed but generally used at bedtime/OSA.  Patient) -Patient seen in consultation by PCCM and no plans for lytics at this point unless patient decompensates. -Blood pressure was borderline but improved with decreased doses of Imdur and Lopressor.   -Status post Lasix 20 mg IV every 12 hours x5 doses with a urine output of 1.5 L over the past 24 hours.     -Transitioned from IV heparin to Eliquis.    2.  Opacity of lung on imaging study -Imaging with right lower lobe airspace opacity-due to hemorrhage versus pneumonia versus edema. -??  Pulmonary infarct. -Status post full course of IV antibiotics.   3.  Acute diverticulitis, POA -Noted on presentation CT abdomen and pelvis. -Patient on IV antibiotics and narrowed to Unasyn and completed a 10-day course of antibiotic treatment . -Tolerating current soft diet.   -Was being followed by surgery during the hospitalization who have signed off.   -Supportive care.   4.  Acute kidney injury -Improved with hydration. -Renal ultrasound negative for hydronephrosis.  5.  Hypokalemia -Repleted.  -Potassium at 4.2 -Repeat labs in the morning.   6.  Chronic respiratory failure with hypoxia, on home O2 therapy -Patient on oxygen at night for sleep apnea. -Outpatient follow-up with pulmonary.  7.  Daytime sleepiness -VBG with respiratory acidosis but compensated with normal pH. -Continue CPAP nightly.  8.  Thrombocytopenia, chronic -Stable.  Monitor with anticoagulation.  9.  CAD -Stable. -Due to soft blood pressure decreased Imdur to 15 mg daily, metoprolol to 12.5 mg twice daily. -Outpatient follow-up.  10.  Hypertension -Soft blood pressure improved on decreased dose of Imdur and decrease dose of metoprolol.   -Continue to hold Norvasc.   -Status post IV Lasix  11.  Right-sided chest  pain -Felt likely musculoskeletal in nature as was tender to palpation. -Chest pain not consistent with ACS. -Chest x-ray with no active infiltrate. -Patient with bilateral PE which may be contributing in part to right-sided chest pain. -Continue anticoagulation. -Symptomatic treatment.  12.  Thyroid nodule -Noted on CT scan, appears to have been biopsied 04/25/2022. -Outpatient follow-up.  13.  Wheezing -Patient with some wheezing noted on examination on 07/15/2021. -Patient prior tobacco use. -Concern for also possible volume overload. -Status post IV Lasix with good urine output.  Patient is -5.163 L during this hospitalization.   -Status post IV Solu-Medrol and scheduled duo nebs.   -Clinical improvement.     DVT prophylaxis: Heparin>>>> Eliquis. Code Status: Full Family Communication: Updated patient.  No family at bedside. Disposition:   Status is: Inpatient Remains inpatient appropriate because: Severity of illness  Planned Discharge Destination: Home with Home Health         Consultants:  PCCM: Dr. Patsey Berthold 07/12/2021 General surgery: Dr.Gerkin 07/07/2021  Procedures:  CT abdomen and pelvis 07/06/2021 CT angiogram chest 07/12/2021 Chest x-ray 07/08/2021, 07/11/2021, 07/14/2021 Renal ultrasound 07/08/2021 2D echo 07/13/2021 Bilateral lower extremity Dopplers 07/13/2021   Antimicrobials:   Augmentin 1/25 /2023x1 dose. -IV Unasyn 07/12/2021>>>>> 07/16/2021 -IV Zosyn 07/07/2021 x 1 dose.  Subjective: Laying in bed.  States last night had a bad night with significant coughing episode with some associated small amounts of hemoptysis.  Denies any chest pain.  Feels shortness of breath has improved.  Somewhat anxious.   Objective: Vitals:   07/17/21 2017 07/18/21 0609 07/18/21 0750 07/18/21 0938  BP: (!) 123/55 116/63  127/66  Pulse: 80 70  69  Resp: 17 18  20   Temp: 97.8 F (36.6 C) 97.8 F (36.6 C)  (!) 97.4 F (36.3 C)  TempSrc: Oral Oral  Oral  SpO2: 98% 97%  96% 99%  Weight:      Height:        Intake/Output Summary (Last 24 hours) at 07/18/2021 1153 Last data filed at 07/18/2021 0900 Gross per 24 hour  Intake 480 ml  Output 1500 ml  Net -1020 ml    Filed Weights   07/06/21 2142  Weight: 90.8 kg    Examination:  General exam: NAD. Respiratory system: Decreased right basilar crackles.  No wheezing.  Fair air movement.  Speaking in full sentences.  Cardiovascular system: RRR no murmurs rubs or gallops.  No JVD.  No lower extremity edema.  Gastrointestinal system: Abdomen is soft, nontender, nondistended, positive bowel sounds.  No rebound.  No guarding.  Central nervous system: Alert and oriented. No focal neurological deficits. Extremities: Symmetric 5 x 5 power. Skin: No rashes, lesions or ulcers Psychiatry: Judgement and insight appear normal. Mood & affect appropriate.     Data Reviewed: I have personally reviewed following labs and imaging studies  CBC: Recent Labs  Lab 07/14/21 0458 07/15/21 0519 07/16/21 0438 07/17/21 0552 07/18/21 0540  WBC 7.4 7.4 10.2 9.6 8.2  NEUTROABS 4.9  --   --   --   --   HGB 11.1* 11.1* 11.3* 11.7* 11.6*  HCT 36.1 36.5 36.3 38.5 37.8  MCV 89.6 90.1 88.5  89.3 89.4  PLT 107* 113* 133* 160 171     Basic Metabolic Panel: Recent Labs  Lab 07/14/21 0458 07/15/21 0519 07/16/21 0438 07/17/21 1042 07/18/21 0540  NA 137 136 135 137 136  K 3.6 3.6 4.4 3.6 4.2  CL 103 105 102 99 99  CO2 27 27 28 31 28   GLUCOSE 101* 103* 148* 95 87  BUN 14 10 12 17 20   CREATININE 0.71 0.76 0.76 0.99 1.00  CALCIUM 9.4 9.4 9.8 10.2 9.6  MG  --  2.0 2.1 1.9 2.1     GFR: Estimated Creatinine Clearance: 50.2 mL/min (by C-G formula based on SCr of 1 mg/dL).  Liver Function Tests: Recent Labs  Lab 07/13/21 0817  AST 16  ALT 11  ALKPHOS 47  BILITOT 0.6  PROT 5.9*  ALBUMIN 2.6*     CBG: Recent Labs  Lab 07/15/21 1230 07/15/21 1612 07/16/21 0733 07/16/21 1128 07/17/21 0721  GLUCAP 116*  187* 127* 144* 95      No results found for this or any previous visit (from the past 240 hour(s)).         Radiology Studies: No results found.      Scheduled Meds:  apixaban  10 mg Oral BID   Followed by   Derrill Memo ON 07/23/2021] apixaban  5 mg Oral BID   buPROPion  300 mg Oral Daily   diclofenac Sodium  2 g Topical QID   feeding supplement  1 Container Oral TID BM   fluticasone  2 spray Each Nare Daily   gabapentin  600 mg Oral TID   ipratropium-albuterol  3 mL Nebulization BID   isosorbide mononitrate  15 mg Oral QHS   linaclotide  145 mcg Oral QAC breakfast   loratadine  10 mg Oral Daily   metoprolol tartrate  12.5 mg Oral BID   pantoprazole  40 mg Oral BID   rosuvastatin  10 mg Oral QHS   Continuous Infusions:  sodium chloride 10 mL/hr at 07/15/21 0932     LOS: 11 days    Time spent: 40 minutes    Irine Seal, MD [facility]   To contact the attending provider between 7A-7P or the covering provider during after hours 7P-7A, please log into the web site [email or web address] and access using universal [place] password for that web site. If you do not have the password, please call the hospital operator.  07/18/2021, 11:53 AM

## 2021-07-18 NOTE — Progress Notes (Signed)
Patient refused the CPAP for the night.

## 2021-07-18 NOTE — Progress Notes (Signed)
SATURATION QUALIFICATIONS: (This note is used to comply with regulatory documentation for home oxygen)  Patient Saturations on Room Air at Rest = 97%  Patient Saturations on Room Air while Ambulating = 83%  Patient Saturations on 3 Liters of oxygen while Ambulating = 92%  Please briefly explain why patient needs home oxygen:

## 2021-07-19 LAB — CBC
HCT: 38.7 % (ref 36.0–46.0)
Hemoglobin: 11.7 g/dL — ABNORMAL LOW (ref 12.0–15.0)
MCH: 27.5 pg (ref 26.0–34.0)
MCHC: 30.2 g/dL (ref 30.0–36.0)
MCV: 90.8 fL (ref 80.0–100.0)
Platelets: 177 10*3/uL (ref 150–400)
RBC: 4.26 MIL/uL (ref 3.87–5.11)
RDW: 18.4 % — ABNORMAL HIGH (ref 11.5–15.5)
WBC: 9.3 10*3/uL (ref 4.0–10.5)
nRBC: 0 % (ref 0.0–0.2)

## 2021-07-19 LAB — BASIC METABOLIC PANEL
Anion gap: 6 (ref 5–15)
BUN: 19 mg/dL (ref 8–23)
CO2: 29 mmol/L (ref 22–32)
Calcium: 9.7 mg/dL (ref 8.9–10.3)
Chloride: 103 mmol/L (ref 98–111)
Creatinine, Ser: 0.85 mg/dL (ref 0.44–1.00)
GFR, Estimated: >60 mL/min (ref >60)
Glucose, Bld: 121 mg/dL — ABNORMAL HIGH (ref 70–99)
Potassium: 4.5 mmol/L (ref 3.5–5.1)
Sodium: 138 mmol/L (ref 135–145)

## 2021-07-19 MED ORDER — SPIRIVA HANDIHALER 18 MCG IN CAPS
18.0000 ug | ORAL_CAPSULE | Freq: Every day | RESPIRATORY_TRACT | 2 refills | Status: DC
Start: 2021-07-19 — End: 2022-01-18

## 2021-07-19 MED ORDER — APIXABAN 5 MG PO TABS: ORAL_TABLET | ORAL | 1 refills | Status: DC

## 2021-07-19 MED ORDER — ISOSORBIDE MONONITRATE ER 30 MG PO TB24: 15.0000 mg | ORAL_TABLET | Freq: Every day | ORAL | 1 refills | Status: DC

## 2021-07-19 MED ORDER — METOPROLOL TARTRATE 25 MG PO TABS: 12.5000 mg | ORAL_TABLET | Freq: Two times a day (BID) | ORAL | 2 refills | Status: DC

## 2021-07-19 MED ORDER — SPIRIVA HANDIHALER 18 MCG IN CAPS
18.0000 ug | ORAL_CAPSULE | Freq: Every day | RESPIRATORY_TRACT | 1 refills | Status: DC
Start: 2021-07-19 — End: 2021-07-19

## 2021-07-19 MED ORDER — BUDESONIDE-FORMOTEROL FUMARATE 160-4.5 MCG/ACT IN AERO: 2.0000 | INHALATION_SPRAY | Freq: Two times a day (BID) | RESPIRATORY_TRACT | 1 refills | Status: DC

## 2021-07-19 MED ORDER — LORATADINE 10 MG PO TABS: 10.0000 mg | ORAL_TABLET | Freq: Every day | ORAL | 1 refills | Status: DC

## 2021-07-19 MED ORDER — FLUTICASONE PROPIONATE 50 MCG/ACT NA SUSP: 2.0000 | Freq: Every day | NASAL | 1 refills | Status: DC

## 2021-07-19 MED ORDER — ALBUTEROL SULFATE HFA 108 (90 BASE) MCG/ACT IN AERS: INHALATION_SPRAY | RESPIRATORY_TRACT | 2 refills | Status: DC

## 2021-07-19 MED ORDER — BUDESONIDE-FORMOTEROL FUMARATE 160-4.5 MCG/ACT IN AERO: 2.0000 | INHALATION_SPRAY | Freq: Two times a day (BID) | RESPIRATORY_TRACT | 2 refills | Status: DC

## 2021-07-19 MED ORDER — METOPROLOL TARTRATE 25 MG PO TABS: 12.5000 mg | ORAL_TABLET | Freq: Two times a day (BID) | ORAL | 1 refills | Status: DC

## 2021-07-19 NOTE — Discharge Summary (Signed)
Physician Discharge Summary  Stephanie Alvarado HER:[id number] DOB: 1941/05/27 DOA: 07/06/2021  PCP: Caryl Bis, MD  Admit date: 07/06/2021 Discharge date: 07/19/2021  Time spent: 60 minutes  Recommendations for Outpatient Follow-up:  Patient was discharged home with home health and home O2 Follow-up with Caryl Bis, MD in 2 weeks.  On follow-up patient will need a basic metabolic profile, magnesium level checked to follow-up on electrolytes and renal function.  Patient will need ambulatory sats done on follow-up.  Patient will need diverticulitis reassessed on follow-up.  Blood pressure will need to be reassessed as patient is antihypertensive medication doses were decreased during the hospitalization. Follow-up with Dr. Loanne Drilling, pulmonary as scheduled on 07/29/2021.   Discharge Diagnoses:  Principal Problem:   Pulmonary embolism (HCC) Active Problems:   Thyroid nodule   Essential hypertension   CAD (coronary artery disease)   Diverticulitis   Chronic diastolic CHF (congestive heart failure) (HCC)   Thrombocytopenia (HCC)   Chronic respiratory failure with hypoxia, on home oxygen therapy (HCC)   Hypokalemia   AKI (acute kidney injury) ([place])   Daytime sleepiness   Chest pain   Acute on chronic respiratory failure with hypoxia ([place])   Opacity of lung on imaging study   Discharge Condition: Stable and improved  Diet recommendation: Heart healthy  Filed Weights   07/06/21 2142  Weight: 90.8 kg    History of present illness:  HPI per Dr. Gevena Alvarado is a 81 y.o. female with history of CAD status post stenting, chronic diastolic CHF, hypertension, sleep apnea uses oxygen at home presents to the ER after patient started having severe abdominal pain involving the mid abdomen and right lower quadrant since 2 PM yesterday.  Pain is constant had some episodes of vomiting no blood in the vomitus denies any diarrhea fever or chills.   ED Course: In the ER patient had  CT abdomen pelvis which shows diverticulitis involving the small bowel.  Patient was started on empiric antibiotics admitted for further management.  COVID test is pending.  Hospital Course:  1 acute bilateral submassive PE/right lower extremity DVT -Noted to have possibly been provoked during the hospitalization. -CT angiogram chest with concerns for right heart strain. -2D echo obtained with normal right ventricular systolic function, EF 60 to 85%, grade 1 diastolic dysfunction. -Lower extremity Dopplers done with concerns will right posterior tibial vein DVT. -BNP noted to be mildly elevated. -Troponin is elevated but downtrending in the setting of PE. -PESI class 3-4 (chronic O2 use at home as needed but generally used at bedtime/OSA.  Patient) -Patient seen in consultation by PCCM and no plans for lytics at this point unless patient decompensates. -Blood pressure was borderline but improved with decreased doses of Imdur and Lopressor.   -Status post Lasix 20 mg IV every 12 hours x5 doses with good urine output during the hospitalization.   -Patient initially maintained on IV heparin and received 72 hours of heparin and subsequently transitioned to Eliquis with no complications noted.   -Patient will be discharged on Eliquis, outpatient follow-up with PCP and pulmonary.    2.  Opacity of lung on imaging study -Imaging with right lower lobe airspace opacity-due to hemorrhage versus pneumonia versus edema. -??  Pulmonary infarct. -Status post full course of IV antibiotics.   3.  Acute diverticulitis, POA -Noted on presentation CT abdomen and pelvis. -Patient on IV antibiotics and narrowed to Unasyn and completed a 10-day course of antibiotic treatment . -Tolerating current soft  diet.   -Was being followed by surgery during the hospitalization who have signed off.   -Outpatient follow-up with PCP.  4.  Acute kidney injury -Improved with hydration. -Renal ultrasound negative for  hydronephrosis.  5.  Hypokalemia -Repleted.   6.  Chronic respiratory failure with hypoxia, on home O2 therapy -Patient on oxygen at night for sleep apnea. -Outpatient follow-up with pulmonary.  7.  Daytime sleepiness -VBG with respiratory acidosis but compensated with normal pH. - CPAP nightly ordered however patient noted to have refused during the hospitalization..  8.  Thrombocytopenia, chronic -Stable.  Monitor with anticoagulation.  9.  CAD -Stable. -Due to soft blood pressure decreased Imdur to 15 mg daily, metoprolol to 12.5 mg twice daily. -Outpatient follow-up.  10.  Hypertension -Soft blood pressure improved on decreased dose of Imdur and decrease dose of metoprolol.   -Patient's Norvasc was held during the hospitalization and will be resumed on discharge..   -Patient received IV Lasix.   -Patient be discharged home on decreased dose of Imdur and metoprolol.   -Outpatient follow-up with PCP.    11.  Right-sided chest pain -Felt likely musculoskeletal in nature as was tender to palpation. -Chest pain not consistent with ACS. -Chest x-ray with no active infiltrate. -Patient with bilateral PE which may be contributing in part to right-sided chest pain. -Patient maintained on anticoagulation during the hospitalization.   -Patient improved clinically.   -Outpatient follow-up.    12.  Thyroid nodule -Noted on CT scan, appears to have been biopsied 04/25/2022. -Outpatient follow-up.  13.  Wheezing -Patient with some wheezing noted on examination on 07/15/2021. -Patient prior tobacco use. -Concern for also possible volume overload. -Status post IV Lasix with good urine output.  Patient is -5.5 L during this hospitalization.   -Status post IV Solu-Medrol and scheduled duo nebs.   -Patient improved clinically and will be discharged home on Symbicort and Spiriva. -Outpatient follow-up with pulmonary as scheduled and pcp.    Procedures: CT abdomen and pelvis  07/06/2021 CT angiogram chest 07/12/2021 Chest x-ray 07/08/2021, 07/11/2021, 07/14/2021 Renal ultrasound 07/08/2021 2D echo 07/13/2021 Bilateral lower extremity Dopplers 07/13/2021  Consultations: PCCM: Dr. Patsey Berthold 07/12/2021 General surgery: Dr.Gerkin 07/07/2021  Discharge Exam: Vitals:   07/19/21 0635 07/19/21 0803  BP: 121/60   Pulse: 74   Resp: 17   Temp: 98.6 F (37 C)   SpO2: 93% 95%    General: NAD Cardiovascular: RRR no murmurs rubs or gallops.  No JVD.  No lower extremity edema. Respiratory: Clear to auscultation bilaterally.  No wheezes, no crackles, no rhonchi.  Fair air movement.  Speaking in full sentences.  Discharge Instructions   Discharge Instructions     Diet - low sodium heart healthy   Complete by: As directed    Discharge instructions   Complete by: As directed    You were seen for diverticulitis.  You've improved with antibiotics.  We'll send you home with antibiotics to complete a course.    Please follow up with your PCP outpatient.   I think you should have a sleep study outpatient.  Follow up your oxygen needs with your PCP outpatient.  I think your chest pain is musculoskeletal.  Follow this with your PCP outpatient.  You have thrombocytopenia.  Follow up with your PCP outpatient for repeat labs.  Return for new, recurrent, or worsening symptoms.  Please ask your PCP to request records from this hospitalization so they know what was done and what the next steps will be.  Increase activity slowly   Complete by: As directed    Increase activity slowly   Complete by: As directed       Allergies as of 07/19/2021       Reactions   Codeine Itching   REACTION: itch        Medication List     STOP taking these medications    cephALEXin 500 MG capsule Commonly known as: Keflex       TAKE these medications    albuterol 108 (90 Base) MCG/ACT inhaler Commonly known as: VENTOLIN HFA Inhale 2 puffs into the lungs in the morning, at  noon, and at bedtime for 4 days, THEN 2 puffs every 6 (six) hours as needed for up to 4 days for wheezing or shortness of breath. Start taking on: July 19, 2021   amLODipine 5 MG tablet Commonly known as: NORVASC Take 5 mg by mouth every morning.   apixaban 5 MG Tabs tablet Commonly known as: ELIQUIS Take 2 tablets (10 mg total) by mouth 2 (two) times daily for 4 days, THEN 1 tablet (5 mg total) 2 (two) times daily. Start taking on: July 23, 2021   bisacodyl 10 MG suppository Commonly known as: DULCOLAX Place 1 suppository (10 mg total) rectally daily as needed for moderate constipation.   budesonide-formoterol 160-4.5 MCG/ACT inhaler Commonly known as: Symbicort Inhale 2 puffs into the lungs 2 (two) times daily.   buPROPion 150 MG 24 hr tablet Commonly known as: WELLBUTRIN XL Take 300 mg by mouth daily.   docusate sodium 100 MG capsule Commonly known as: COLACE Take 100 mg by mouth daily as needed for mild constipation.   fluticasone 50 MCG/ACT nasal spray Commonly known as: FLONASE Place 2 sprays into both nostrils daily. Start taking on: July 20, 2021   furosemide 40 MG tablet Commonly known as: LASIX Take 40 mg by mouth daily as needed for edema.   gabapentin 300 MG capsule Commonly known as: NEURONTIN Take 600 mg by mouth 3 (three) times daily.   isosorbide mononitrate 30 MG 24 hr tablet Commonly known as: IMDUR Take 0.5 tablets (15 mg total) by mouth at bedtime. What changed: how much to take   linaclotide 145 MCG Caps capsule Commonly known as: Linzess Take 1 capsule (145 mcg total) by mouth daily before breakfast.   loratadine 10 MG tablet Commonly known as: CLARITIN Take 1 tablet (10 mg total) by mouth daily. Start taking on: July 20, 2021   metoprolol tartrate 25 MG tablet Commonly known as: LOPRESSOR Take 0.5 tablets (12.5 mg total) by mouth 2 (two) times daily. What changed:  medication strength how much to take   multivitamin  capsule Take 1 capsule by mouth daily.   mupirocin ointment 2 % Commonly known as: BACTROBAN Apply 1 application topically daily.   nitroGLYCERIN 0.4 MG SL tablet Commonly known as: NITROSTAT Place 0.4 mg under the tongue every 5 (five) minutes as needed for chest pain (TAKE UP TO 3 DOSES BEFORE CALLING 911).   pantoprazole 40 MG tablet Commonly known as: PROTONIX Take 40 mg by mouth 2 (two) times daily.   Pfizer COVID-19 Vac Bivalent injection Generic drug: COVID-19 mRNA bivalent vaccine (Pfizer)   polyethylene glycol powder 17 GM/SCOOP powder Commonly known as: GLYCOLAX/MIRALAX Take 17 g by mouth daily as needed for moderate constipation.   polyvinyl alcohol 1.4 % ophthalmic solution Commonly known as: LIQUIFILM TEARS Place 1 drop into both eyes as needed for dry eyes.   rosuvastatin 10 MG  tablet Commonly known as: CRESTOR Take 10 mg by mouth at bedtime.   Spiriva HandiHaler 18 MCG inhalation capsule Generic drug: tiotropium Place 1 capsule (18 mcg total) into inhaler and inhale daily.               Durable Medical Equipment  (From admission, onward)           Start     Ordered   07/16/21 1154  For home use only DME oxygen  Once       Question Answer Comment  Length of Need Lifetime   Liters per Minute 2   Frequency Continuous (stationary and portable oxygen unit needed)   Oxygen conserving device Yes   Oxygen delivery system Gas      07/16/21 1154           Allergies  Allergen Reactions   Codeine Itching    REACTION: itch    Follow-up [place], [place] Follow up.   Why: [facility]/physical therapy/occupational therapy Contact information: [address] [place] [address] [phone]         [facility] Follow up.   Why: oxygen Contact information: [place] [address] [phone]         Caryl Bis, MD. Schedule an appointment as soon as possible  for a visit in 2 week(s).   Specialty: Family Medicine Contact information: [place] [address] [phone]         Sherren Mocha, MD .   Specialty: Cardiology Contact information: [phone] [address] [place] [address] [phone]         Margaretha Seeds, MD Follow up on 07/29/2021.   Specialty: Pulmonary Disease Why: Follow-up at 11:30 AM Contact information: [place]  [address] [phone]                  The results of significant diagnostics from this hospitalization (including imaging, microbiology, ancillary and laboratory) are listed below for reference.    Significant Diagnostic Studies: DG Chest 2 View  Result Date: 07/11/2021 CLINICAL DATA:  Chest pain. EXAM: CHEST - 2 VIEW COMPARISON:  July 08, 2021. FINDINGS: Stable cardiomediastinal silhouette. Both lungs are clear. The visualized skeletal structures are unremarkable. IMPRESSION: No active cardiopulmonary disease. Aortic Atherosclerosis (ICD10-I70.0). Electronically Signed   By: Marijo Conception M.D.   On: 07/11/2021 16:25   CT Angio Chest Pulmonary Embolism (PE) W or WO Contrast  Result Date: 07/12/2021 CLINICAL DATA:  Chest pain/shortness breath. EXAM: CT ANGIOGRAPHY CHEST WITH CONTRAST TECHNIQUE: Multidetector CT imaging of the chest was performed using the standard protocol during bolus administration of intravenous contrast. Multiplanar CT image reconstructions and MIPs were obtained to evaluate the vascular anatomy. RADIATION DOSE REDUCTION: This exam was performed according to the departmental dose-optimization program which includes automated exposure control, adjustment of the mA and/or kV according to patient size and/or use of iterative reconstruction technique. CONTRAST:  40mL OMNIPAQUE IOHEXOL 350 MG/ML SOLN COMPARISON:  Chest radiograph 07/11/2021 and chest CT 05/31/2016 FINDINGS: Cardiovascular: There is substantial acute  bilateral pulmonary embolus in lobar branches of the right upper lobe, right middle lobe, left upper lobe, and left lower lobe with bridging thrombus in the left main pulmonary artery and in the right pulmonary artery. Clot burden is high. Right ventricular to left ventricular ratio 0.91. Coronary, aortic arch, and branch vessel atherosclerotic  vascular disease. Mediastinum/Nodes: Hypodense right thyroid lesion up to 4.2 cm in long axis on image 16 series 5, previously about 3.4 cm. It appears that this nodule was probably biopsied on 04/25/2012. Small type 1 hernia. Lungs/Pleura: There is some patchy indistinct airspace opacity in the right lower lobe potentially from pulmonary hemorrhage, pneumonia, or localized edema. Small right pleural effusion. Upper Abdomen: Atherosclerosis of the abdominal aorta and its branches. Musculoskeletal: Thoracic kyphosis and spondylosis. Suspected lipoma in the right proximal upper arm on image 5 series 4. Review of the MIP images confirms the above findings. IMPRESSION: 1. Extensive bilateral pulmonary embolus. Clot burden is high. Positive for acute PE with CT evidence of right heart strain (RV/LV Ratio = 0.91) consistent with at least submassive (intermediate risk) PE. The presence of right heart strain has been associated with an increased risk of morbidity and mortality. Please refer to the "PE Focused" order set in EPIC. 2. Hypodense right thyroid nodule, this appears to of been biopsied on 04/25/2012. 3. Small type 1 hiatal hernia. 4. Localized airspace opacity in the right lower lobe potentially from pulmonary hemorrhage, pneumonia, or localized edema. Small right pleural effusion. 5. Aortic Atherosclerosis (ICD10-I70.0). Coronary and systemic atherosclerosis. Critical Value/emergent results were called by telephone at the time of interpretation on 07/12/2021 at 5:48 pm to provider Dr. Florene Glen , who verbally acknowledged these results. Electronically Signed   By: Van Clines M.D.   On: 07/12/2021 17:54   CT ABDOMEN PELVIS W CONTRAST  Result Date: 07/06/2021 CLINICAL DATA:  Right lower quadrant abdominal pain. EXAM: CT ABDOMEN AND PELVIS WITH CONTRAST TECHNIQUE: Multidetector CT imaging of the abdomen and pelvis was performed using the standard protocol following bolus administration of intravenous contrast. RADIATION DOSE REDUCTION: This exam was performed according to the departmental dose-optimization program which includes automated exposure control, adjustment of the mA and/or kV according to patient size and/or use of iterative reconstruction technique. CONTRAST:  178mL OMNIPAQUE IOHEXOL 300 MG/ML  SOLN COMPARISON:  CT abdomen pelvis dated 09/15/2017. FINDINGS: Lower chest: The visualized lung bases are clear. Advanced 3 vessel coronary vascular calcification. No intra-abdominal free air or free fluid. Hepatobiliary: The liver is unremarkable. There is mild intrahepatic biliary ductal dilatation, likely post cholecystectomy. Pancreas: Unremarkable. No pancreatic ductal dilatation or surrounding inflammatory changes. Spleen: Normal in size without focal abnormality. Adrenals/Urinary Tract: The adrenal glands unremarkable there is a punctate nonobstructing left renal upper pole calculus. There is mild parenchyma atrophy and diffuse cortical irregularity and scarring. Small bilateral renal hypodense lesions are not characterized. There is no hydronephrosis on either side. There is symmetric enhancement and excretion of contrast by both kidneys. The visualized ureters and urinary bladder appear unremarkable. Stomach/Bowel: Severe sigmoid diverticulosis without active inflammatory changes. Several diverticula in the small bowel in the mid abdomen measuring up to approximately 2 cm (45/2 and 50/5). There is mild haziness of the surrounding fat concerning for diverticulitis. No diverticular abscess or perforation. There is no bowel obstruction. The appendix is not  visualized with certainty. No inflammatory changes identified in the right lower quadrant. Vascular/Lymphatic: Advanced aortoiliac atherosclerotic disease. The IVC is unremarkable. No portal venous gas. There is no adenopathy. Reproductive: Hysterectomy. No adnexal masses. A 2 cm right ovarian cyst. No follow-up imaging recommended. Note: This recommendation does not apply to premenarchal patients and to those with increased risk (genetic, family history, elevated tumor markers or other high-risk factors) of ovarian cancer. Reference: JACR 2020 Feb; 17(2):248-254 Other: None Musculoskeletal: Degenerative changes of the spine. Lower  lumbar fusion screws. No acute osseous pathology. IMPRESSION: 1. Findings concerning for diverticulitis of the small bowel in the mid abdomen. No diverticular abscess or perforation. 2. Aortic Atherosclerosis (ICD10-I70.0). Electronically Signed   By: Anner Crete M.D.   On: 07/06/2021 23:30   US RENAL  Result Date: 07/08/2021 CLINICAL DATA:  Acute kidney injury. EXAM: RENAL / URINARY TRACT ULTRASOUND COMPLETE COMPARISON:  Abdomen and pelvis CT dated 09/15/2017 FINDINGS: Right Kidney: Renal measurements: 8.3 x 4.6 x 4.6 cm = volume: 93 mL. Normal echotexture. No hydronephrosis. Left Kidney: Renal measurements: 9.8 x 4.6 x 4.3 cm = volume: 100 mL. Normal echotexture. 1.5 cm partially exophytic simple appearing cyst. No hydronephrosis. Bladder: Appears normal for degree of bladder distention. Other: None. IMPRESSION: No significant abnormality. Electronically Signed   By: Claudie Revering M.D.   On: 07/08/2021 14:30   DG CHEST PORT 1 VIEW  Result Date: 07/14/2021 CLINICAL DATA:  hypoxia EXAM: PORTABLE CHEST - 1 VIEW COMPARISON:  07/11/2021 FINDINGS: Lungs are clear. Heart size and mediastinal contours are within normal limits.Aortic Atherosclerosis (ICD10-170.0). No effusion. Visualized bones unremarkable. Surgical clips at the thoracic inlet. IMPRESSION: No acute cardiopulmonary  disease. Electronically Signed   By: Lucrezia Europe M.D.   On: 07/14/2021 08:02   DG CHEST PORT 1 VIEW  Result Date: 07/08/2021 CLINICAL DATA:  Shortness of breath EXAM: PORTABLE CHEST 1 VIEW COMPARISON:  Chest radiograph done on 11/14/2013, CT done on 05/31/2016 FINDINGS: Cardiac size is within normal limits. There is poor inspiration. Central pulmonary vessels are prominent without signs of pulmonary edema. There is no focal pulmonary consolidation. There are small linear densities in the right paratracheal region and right lower lung fields. Surgical clips are seen in the left paratracheal region. IMPRESSION: Central pulmonary vessels are prominent without signs of alveolar pulmonary edema. There is no focal pulmonary consolidation. Small linear densities in the right mid and right lower lung fields may suggest subsegmental atelectasis. Electronically Signed   By: Elmer Picker M.D.   On: 07/08/2021 21:00   ECHOCARDIOGRAM COMPLETE  Result Date: 07/13/2021    ECHOCARDIOGRAM REPORT   Patient Name:   SHER SHAMPINE Date of Exam: 07/13/2021 Medical Rec #:  [id number]     Height:       68.0 in Accession #:    [id number]    Weight:       200.2 lb Date of Birth:  [birth year]     BSA:          2.045 m Patient Age:    27 years      BP:           110/58 mmHg Patient Gender: F             HR:           71 bpm. Exam Location:  Inpatient Procedure: 2D Echo, Cardiac Doppler and Color Doppler Indications:    pulm. embolus  History:        Patient has prior history of Echocardiogram examinations, most                 recent 11/16/2020. CAD; Risk Factors:Hypertension and                 Dyslipidemia. Mvp/ aov sclrosis.  Sonographer:    Beryle Beams Referring Phys: [phone] A CALDWELL POWELL JR IMPRESSIONS  1. Left ventricular ejection fraction, by estimation, is 60 to 65%. The left ventricle has normal function. The left ventricle has no regional wall  motion abnormalities. Left ventricular diastolic parameters are consistent  with Grade I diastolic dysfunction (impaired relaxation).  2. Right ventricular systolic function is normal. The right ventricular size is normal.  3. The mitral valve is normal in structure. Mild mitral valve regurgitation. No evidence of mitral stenosis.  4. The aortic valve is tricuspid. There is mild calcification of the aortic valve. Aortic valve regurgitation is not visualized. Aortic valve sclerosis is present, with no evidence of aortic valve stenosis.  5. The inferior vena cava is normal in size with greater than 50% respiratory variability, suggesting right atrial pressure of 3 mmHg. FINDINGS  Left Ventricle: Left ventricular ejection fraction, by estimation, is 60 to 65%. The left ventricle has normal function. The left ventricle has no regional wall motion abnormalities. The left ventricular internal cavity size was normal in size. There is  no left ventricular hypertrophy. Left ventricular diastolic parameters are consistent with Grade I diastolic dysfunction (impaired relaxation). Right Ventricle: The right ventricular size is normal. No increase in right ventricular wall thickness. Right ventricular systolic function is normal. Left Atrium: Left atrial size was normal in size. Right Atrium: Right atrial size was normal in size. Pericardium: There is no evidence of pericardial effusion. Mitral Valve: The mitral valve is normal in structure. Mild mitral valve regurgitation. No evidence of mitral valve stenosis. Tricuspid Valve: The tricuspid valve is normal in structure. Tricuspid valve regurgitation is not demonstrated. No evidence of tricuspid stenosis. Aortic Valve: The aortic valve is tricuspid. There is mild calcification of the aortic valve. Aortic valve regurgitation is not visualized. Aortic valve sclerosis is present, with no evidence of aortic valve stenosis. Aortic valve mean gradient measures 3.0 mmHg. Aortic valve peak gradient measures 6.4 mmHg. Aortic valve area, by VTI measures 2.76 cm.  Pulmonic Valve: The pulmonic valve was normal in structure. Pulmonic valve regurgitation is not visualized. No evidence of pulmonic stenosis. Aorta: The aortic root is normal in size and structure. Venous: The inferior vena cava is normal in size with greater than 50% respiratory variability, suggesting right atrial pressure of 3 mmHg. IAS/Shunts: No atrial level shunt detected by color flow Doppler.  LEFT VENTRICLE PLAX 2D LVIDd:         4.80 cm     Diastology LVIDs:         3.30 cm     LV e' medial:    6.64 cm/s LV PW:         1.00 cm     LV E/e' medial:  11.2 LV IVS:        0.80 cm     LV e' lateral:   8.27 cm/s LVOT diam:     2.10 cm     LV E/e' lateral: 9.0 LV SV:         72 LV SV Index:   35 LVOT Area:     3.46 cm  LV Volumes (MOD) LV vol d, MOD A2C: 58.1 ml LV vol d, MOD A4C: 71.0 ml LV vol s, MOD A2C: 20.1 ml LV vol s, MOD A4C: 27.4 ml LV SV MOD A2C:     38.0 ml LV SV MOD A4C:     71.0 ml LV SV MOD BP:      40.6 ml RIGHT VENTRICLE             IVC RV S prime:     13.10 cm/s  IVC diam: 1.60 cm TAPSE (M-mode): 2.2 cm LEFT ATRIUM  Index        RIGHT ATRIUM           Index LA diam:        4.00 cm 1.96 cm/m   RA Area:     16.50 cm LA Vol (A2C):   54.6 ml 26.71 ml/m  RA Volume:   44.80 ml  21.91 ml/m LA Vol (A4C):   54.2 ml 26.51 ml/m LA Biplane Vol: 54.1 ml 26.46 ml/m  AORTIC VALVE                    PULMONIC VALVE AV Area (Vmax):    3.02 cm     PV Vmax:       0.64 m/s AV Area (Vmean):   2.68 cm     PV Vmean:      42.900 cm/s AV Area (VTI):     2.76 cm     PV VTI:        0.115 m AV Vmax:           126.00 cm/s  PV Peak grad:  1.6 mmHg AV Vmean:          85.500 cm/s  PV Mean grad:  1.0 mmHg AV VTI:            0.262 m AV Peak Grad:      6.4 mmHg AV Mean Grad:      3.0 mmHg LVOT Vmax:         110.00 cm/s LVOT Vmean:        66.200 cm/s LVOT VTI:          0.209 m LVOT/AV VTI ratio: 0.80  AORTA Ao Root diam: 2.80 cm Ao Asc diam:  2.60 cm MITRAL VALVE               TRICUSPID VALVE MV Area (PHT): 4.17  cm    TV Peak grad:   39.8 mmHg MV Decel Time: 182 msec    TV Mean grad:   26.0 mmHg MV E velocity: 74.60 cm/s  TV Vmax:        3.16 m/s MV A velocity: 93.00 cm/s  TV Vmean:       240.0 cm/s MV E/A ratio:  0.80        TV VTI:         1.05 msec                             SHUNTS                            Systemic VTI:  0.21 m                            Systemic Diam: 2.10 cm Candee Furbish MD Electronically signed by Candee Furbish MD Signature Date/Time: 07/13/2021/1:50:05 PM    Final    VAS [place] LOWER EXTREMITY VENOUS (DVT)  Result Date: 07/13/2021  Lower Venous DVT Study Patient Name:  KRISTEEN LANTZ  Date of Exam:   07/13/2021 Medical Rec #: [id number]      Accession #:    [id number] Date of Birth: Sep 09, 1940      Patient Gender: F Patient Age:   26 years Exam Location:  [facility] Procedure:      VAS [place] LOWER EXTREMITY VENOUS (DVT) Referring Phys: A POWELL JR --------------------------------------------------------------------------------  Indications: Pulmonary embolism.  Risk Factors: Confirmed PE. Anticoagulation: Heparin. Limitations: Poor ultrasound/tissue interface. Comparison Study: No prior studies. Performing Technologist: Oliver Hum RVT  Examination Guidelines: A complete evaluation includes B-mode imaging, spectral Doppler, color Doppler, and power Doppler as needed of all accessible portions of each vessel. Bilateral testing is considered an integral part of a complete examination. Limited examinations for reoccurring indications may be performed as noted. The reflux portion of the exam is performed with the patient in reverse Trendelenburg.  +---------+---------------+---------+-----------+----------+--------------+  RIGHT     Compressibility Phasicity Spontaneity Properties Thrombus Aging  +---------+---------------+---------+-----------+----------+--------------+  CFV       Full            Yes       Yes                                     +---------+---------------+---------+-----------+----------+--------------+  SFJ       Full                                                             +---------+---------------+---------+-----------+----------+--------------+  FV Prox   Full                                                             +---------+---------------+---------+-----------+----------+--------------+  FV Mid    Full                                                             +---------+---------------+---------+-----------+----------+--------------+  FV Distal Full                                                             +---------+---------------+---------+-----------+----------+--------------+  PFV       Full                                                             +---------+---------------+---------+-----------+----------+--------------+  POP       Full            Yes       Yes                                    +---------+---------------+---------+-----------+----------+--------------+  PTV       Partial  Acute           +---------+---------------+---------+-----------+----------+--------------+  PERO      Full                                                             +---------+---------------+---------+-----------+----------+--------------+   +---------+---------------+---------+-----------+----------+--------------+  LEFT      Compressibility Phasicity Spontaneity Properties Thrombus Aging  +---------+---------------+---------+-----------+----------+--------------+  CFV       Full            Yes       Yes                                    +---------+---------------+---------+-----------+----------+--------------+  SFJ       Full                                                             +---------+---------------+---------+-----------+----------+--------------+  FV Prox   Full                                                              +---------+---------------+---------+-----------+----------+--------------+  FV Mid    Full                                                             +---------+---------------+---------+-----------+----------+--------------+  FV Distal Full                                                             +---------+---------------+---------+-----------+----------+--------------+  PFV       Full                                                             +---------+---------------+---------+-----------+----------+--------------+  POP       Full            Yes       Yes                                    +---------+---------------+---------+-----------+----------+--------------+  PTV       Full                                                             +---------+---------------+---------+-----------+----------+--------------+  PERO      Full                                                             +---------+---------------+---------+-----------+----------+--------------+     Summary: RIGHT: - Findings consistent with acute deep vein thrombosis involving the right posterior tibial veins. - No cystic structure found in the popliteal fossa.  LEFT: - There is no evidence of deep vein thrombosis in the lower extremity.  - No cystic structure found in the popliteal fossa.  *See table(s) above for measurements and observations. Electronically signed by Deitra Mayo MD on 07/13/2021 at 1:10:09 PM.    Final     Microbiology: No results found for this or any previous visit (from the past 240 hour(s)).   Labs: Basic Metabolic Panel: Recent Labs  Lab 07/15/21 0519 07/16/21 0438 07/17/21 1042 07/18/21 0540 07/19/21 0451  NA 136 135 137 136 138  K 3.6 4.4 3.6 4.2 4.5  CL 105 102 99 99 103  CO2 27 28 31 28 29   GLUCOSE 103* 148* 95 87 121*  BUN 10 12 17 20 19   CREATININE 0.76 0.76 0.99 1.00 0.85  CALCIUM 9.4 9.8 10.2 9.6 9.7  MG 2.0 2.1 1.9 2.1  --    Liver Function Tests: Recent Labs  Lab  07/13/21 0817  AST 16  ALT 11  ALKPHOS 47  BILITOT 0.6  PROT 5.9*  ALBUMIN 2.6*   No results for input(s): LIPASE, AMYLASE in the last 168 hours. No results for input(s): AMMONIA in the last 168 hours. CBC: Recent Labs  Lab 07/14/21 0458 07/15/21 0519 07/16/21 0438 07/17/21 0552 07/18/21 0540 07/19/21 0451  WBC 7.4 7.4 10.2 9.6 8.2 9.3  NEUTROABS 4.9  --   --   --   --   --   HGB 11.1* 11.1* 11.3* 11.7* 11.6* 11.7*  HCT 36.1 36.5 36.3 38.5 37.8 38.7  MCV 89.6 90.1 88.5 89.3 89.4 90.8  PLT 107* 113* 133* 160 171 177   Cardiac Enzymes: No results for input(s): CKTOTAL, CKMB, CKMBINDEX, TROPONINI in the last 168 hours. BNP: BNP (last 3 results) Recent Labs    07/12/21 1311 07/14/21 0458  BNP 121.8* 161.9*    ProBNP (last 3 results) No results for input(s): PROBNP in the last 8760 hours.  CBG: Recent Labs  Lab 07/15/21 1230 07/15/21 1612 07/16/21 0733 07/16/21 1128 07/17/21 0721  GLUCAP 116* 187* 127* 144* 95       Signed:  Irine Seal MD.  [facility] 07/19/2021, 12:33 PM

## 2021-07-19 NOTE — TOC Transition Note (Signed)
Transition of Care [facility]) - CM/SW Discharge Note   Patient Details  Name: Stephanie Alvarado MRN: [id number] Date of Birth: 04/02/1941  [facility]) CM/SW Contact:  Dessa Phi, RN Phone Number: 07/19/2021, 12:24 PM   Clinical Narrative:  Spoke to patient about d/c plans-will stay @ address:[address] [place] [id number]-informed [employer] rep Ashley-reviewed correct use of home 02 tank on speaker phone-patient voiced understansing-she will call [phone],option 1-to inform [employer] of her d/c they will deliver home 02 additional tanks to address above. [facility] Corene Cornea aware of d/c today HHRN/PT/OT. No further CM needs.     Final next level of care: [place] Barriers to Discharge: No Barriers Identified   Patient Goals and CMS Choice Patient states their goals for this hospitalization and ongoing recovery are:: go home CMS Medicare.gov Compare Post Acute Care list provided to:: Patient Choice offered to / list presented to : Spouse  Discharge Placement                       Discharge Plan and Services   Discharge Planning Services: CM Consult            DME Arranged: Oxygen DME Agency: [facility] Date DME Agency Contacted: 07/16/21 Time DME Agency Contacted: 0156 Representative spoke with at DME Agency: Caryl Pina HH Arranged: RN, PT, OT [facility] Agency: [place] ([place]) Date [place]: 07/16/21 Time [place]: 1247 Representative spoke with at [place]: [place] ([place]) Interventions     Readmission Risk Interventions Readmission Risk Prevention Plan 12/24/2019  Transportation Screening Complete  Some recent data might be hidden

## 2021-07-19 NOTE — Progress Notes (Signed)
Physical Therapy Treatment Patient Details Name: Stephanie Alvarado MRN: [id number] DOB: 12/31/1940 Today's Date: 07/19/2021   History of Present Illness Pt admitted from home 2* abdominal pain and dx with diverticulitis.  Pt found to have PE this hospitalization.  Pt with hx of CAD, carotid artery disease, MVP, neuropathy, PVD, CVA, back surgery, and bil TKR    PT Comments    Pt ambulated in hallway on 2L O2 [place] and then returned to room for bathing with nurse tech.  Pt likely to d/c home today.    Recommendations for follow up therapy are one component of a multi-disciplinary discharge planning process, led by the attending physician.  Recommendations may be updated based on patient status, additional functional criteria and insurance authorization.  Follow Up Recommendations  Home health PT     Assistance Recommended at Discharge Frequent or constant Supervision/Assistance  Patient can return home with the following A little help with walking and/or transfers;A little help with bathing/dressing/bathroom;Assistance with cooking/housework;Assist for transportation   Equipment Recommendations  None recommended by PT    Recommendations for Other Services       Precautions / Restrictions Precautions Precautions: Fall Precaution Comments: monitor sats     Mobility  Bed Mobility Overal bed mobility: Needs Assistance Bed Mobility: Supine to Sit     Supine to sit: HOB elevated, Supervision     General bed mobility comments: increased time and effort    Transfers Overall transfer level: Needs assistance Equipment used: Rolling walker (2 wheels) Transfers: Sit to/from Stand Sit to Stand: Min guard, Supervision           General transfer comment: verbal cues for hand placement    Ambulation/Gait Ambulation/Gait assistance: Min guard, Supervision Gait Distance (Feet): 240 Feet Assistive device: Rolling walker (2 wheels) Gait Pattern/deviations: Step-through pattern, Trunk  flexed, Decreased stride length       General Gait Details: verbal cues for RW positioning, posture; required at least 3 standing rest breaks due to dyspnea, SpO2 95% on 2L O2 [place]   Stairs             Wheelchair Mobility    Modified Rankin (Stroke Patients Only)       Balance                                            Cognition Arousal/Alertness: Awake/alert Behavior During Therapy: WFL for tasks assessed/performed Overall Cognitive Status: Within Functional Limits for tasks assessed                                          Exercises      General Comments        Pertinent Vitals/Pain Pain Assessment Pain Assessment: No/denies pain    Home Living                          Prior Function            PT Goals (current goals can now be found in the care plan section) Progress towards PT goals: Progressing toward goals    Frequency    Min 3X/week      PT Plan Current plan remains appropriate    Co-evaluation  AM-PAC PT "6 Clicks" Mobility   Outcome Measure  Help needed turning from your back to your side while in a flat bed without using bedrails?: A Little Help needed moving from lying on your back to sitting on the side of a flat bed without using bedrails?: A Little Help needed moving to and from a bed to a chair (including a wheelchair)?: A Little Help needed standing up from a chair using your arms (e.g., wheelchair or bedside chair)?: A Little Help needed to walk in hospital room?: A Little Help needed climbing 3-5 steps with a railing? : A Lot 6 Click Score: 17    End of Session Equipment Utilized During Treatment: Gait belt;Oxygen Activity Tolerance: Patient tolerated treatment well Patient left: with call bell/phone within reach;in chair;with nursing/sitter in room Nurse Communication: Mobility status PT Visit Diagnosis: Muscle weakness (generalized) (M62.81);Difficulty in  walking, not elsewhere classified (R26.2)     Time: 9774-1423 PT Time Calculation (min) (ACUTE ONLY): 17 min  Charges:  $Gait Training: 8-22 mins                    Jannette Spanner PT, DPT Acute Rehabilitation Services Pager: [phone] Office: [place] 07/19/2021, 12:44 PM

## 2021-07-20 DIAGNOSIS — I2699 Other pulmonary embolism without acute cor pulmonale: Secondary | ICD-10-CM | POA: Diagnosis not present

## 2021-07-20 DIAGNOSIS — K5792 Diverticulitis of intestine, part unspecified, without perforation or abscess without bleeding: Secondary | ICD-10-CM | POA: Diagnosis not present

## 2021-07-20 DIAGNOSIS — Z7901 Long term (current) use of anticoagulants: Secondary | ICD-10-CM | POA: Diagnosis not present

## 2021-07-21 DIAGNOSIS — I341 Nonrheumatic mitral (valve) prolapse: Secondary | ICD-10-CM | POA: Diagnosis not present

## 2021-07-21 DIAGNOSIS — E785 Hyperlipidemia, unspecified: Secondary | ICD-10-CM | POA: Diagnosis not present

## 2021-07-21 DIAGNOSIS — F419 Anxiety disorder, unspecified: Secondary | ICD-10-CM | POA: Diagnosis not present

## 2021-07-21 DIAGNOSIS — R4702 Dysphasia: Secondary | ICD-10-CM | POA: Diagnosis not present

## 2021-07-21 DIAGNOSIS — I110 Hypertensive heart disease with heart failure: Secondary | ICD-10-CM | POA: Diagnosis not present

## 2021-07-21 DIAGNOSIS — J9621 Acute and chronic respiratory failure with hypoxia: Secondary | ICD-10-CM | POA: Diagnosis not present

## 2021-07-21 DIAGNOSIS — K5700 Diverticulitis of small intestine with perforation and abscess without bleeding: Secondary | ICD-10-CM | POA: Diagnosis not present

## 2021-07-21 DIAGNOSIS — I2699 Other pulmonary embolism without acute cor pulmonale: Secondary | ICD-10-CM | POA: Diagnosis not present

## 2021-07-21 DIAGNOSIS — I6529 Occlusion and stenosis of unspecified carotid artery: Secondary | ICD-10-CM | POA: Diagnosis not present

## 2021-07-21 DIAGNOSIS — I2510 Atherosclerotic heart disease of native coronary artery without angina pectoris: Secondary | ICD-10-CM | POA: Diagnosis not present

## 2021-07-21 DIAGNOSIS — N179 Acute kidney failure, unspecified: Secondary | ICD-10-CM | POA: Diagnosis not present

## 2021-07-21 DIAGNOSIS — E538 Deficiency of other specified B group vitamins: Secondary | ICD-10-CM | POA: Diagnosis not present

## 2021-07-21 DIAGNOSIS — I872 Venous insufficiency (chronic) (peripheral): Secondary | ICD-10-CM | POA: Diagnosis not present

## 2021-07-21 DIAGNOSIS — I700 Atherosclerosis of aorta: Secondary | ICD-10-CM | POA: Diagnosis not present

## 2021-07-21 DIAGNOSIS — I5032 Chronic diastolic (congestive) heart failure: Secondary | ICD-10-CM | POA: Diagnosis not present

## 2021-07-21 DIAGNOSIS — E041 Nontoxic single thyroid nodule: Secondary | ICD-10-CM | POA: Diagnosis not present

## 2021-07-21 DIAGNOSIS — I739 Peripheral vascular disease, unspecified: Secondary | ICD-10-CM | POA: Diagnosis not present

## 2021-07-21 DIAGNOSIS — M5136 Other intervertebral disc degeneration, lumbar region: Secondary | ICD-10-CM | POA: Diagnosis not present

## 2021-07-21 DIAGNOSIS — D696 Thrombocytopenia, unspecified: Secondary | ICD-10-CM | POA: Diagnosis not present

## 2021-07-21 DIAGNOSIS — I82441 Acute embolism and thrombosis of right tibial vein: Secondary | ICD-10-CM | POA: Diagnosis not present

## 2021-07-21 DIAGNOSIS — G4730 Sleep apnea, unspecified: Secondary | ICD-10-CM | POA: Diagnosis not present

## 2021-07-21 DIAGNOSIS — M1990 Unspecified osteoarthritis, unspecified site: Secondary | ICD-10-CM | POA: Diagnosis not present

## 2021-07-21 DIAGNOSIS — D649 Anemia, unspecified: Secondary | ICD-10-CM | POA: Diagnosis not present

## 2021-07-21 DIAGNOSIS — G629 Polyneuropathy, unspecified: Secondary | ICD-10-CM | POA: Diagnosis not present

## 2021-07-26 DIAGNOSIS — K5792 Diverticulitis of intestine, part unspecified, without perforation or abscess without bleeding: Secondary | ICD-10-CM | POA: Diagnosis not present

## 2021-07-26 DIAGNOSIS — I82409 Acute embolism and thrombosis of unspecified deep veins of unspecified lower extremity: Secondary | ICD-10-CM | POA: Diagnosis not present

## 2021-07-26 DIAGNOSIS — D692 Other nonthrombocytopenic purpura: Secondary | ICD-10-CM | POA: Diagnosis not present

## 2021-07-26 DIAGNOSIS — J9611 Chronic respiratory failure with hypoxia: Secondary | ICD-10-CM | POA: Diagnosis not present

## 2021-07-26 DIAGNOSIS — I5032 Chronic diastolic (congestive) heart failure: Secondary | ICD-10-CM | POA: Diagnosis not present

## 2021-07-26 DIAGNOSIS — I2699 Other pulmonary embolism without acute cor pulmonale: Secondary | ICD-10-CM | POA: Diagnosis not present

## 2021-07-26 DIAGNOSIS — Z6831 Body mass index (BMI) 31.0-31.9, adult: Secondary | ICD-10-CM | POA: Diagnosis not present

## 2021-07-28 DIAGNOSIS — R4702 Dysphasia: Secondary | ICD-10-CM | POA: Diagnosis not present

## 2021-07-28 DIAGNOSIS — D696 Thrombocytopenia, unspecified: Secondary | ICD-10-CM | POA: Diagnosis not present

## 2021-07-28 DIAGNOSIS — D649 Anemia, unspecified: Secondary | ICD-10-CM | POA: Diagnosis not present

## 2021-07-28 DIAGNOSIS — K5700 Diverticulitis of small intestine with perforation and abscess without bleeding: Secondary | ICD-10-CM | POA: Diagnosis not present

## 2021-07-28 DIAGNOSIS — I6529 Occlusion and stenosis of unspecified carotid artery: Secondary | ICD-10-CM | POA: Diagnosis not present

## 2021-07-28 DIAGNOSIS — E041 Nontoxic single thyroid nodule: Secondary | ICD-10-CM | POA: Diagnosis not present

## 2021-07-28 DIAGNOSIS — I2699 Other pulmonary embolism without acute cor pulmonale: Secondary | ICD-10-CM | POA: Diagnosis not present

## 2021-07-28 DIAGNOSIS — N179 Acute kidney failure, unspecified: Secondary | ICD-10-CM | POA: Diagnosis not present

## 2021-07-28 DIAGNOSIS — F419 Anxiety disorder, unspecified: Secondary | ICD-10-CM | POA: Diagnosis not present

## 2021-07-28 DIAGNOSIS — I341 Nonrheumatic mitral (valve) prolapse: Secondary | ICD-10-CM | POA: Diagnosis not present

## 2021-07-28 DIAGNOSIS — E538 Deficiency of other specified B group vitamins: Secondary | ICD-10-CM | POA: Diagnosis not present

## 2021-07-28 DIAGNOSIS — I2510 Atherosclerotic heart disease of native coronary artery without angina pectoris: Secondary | ICD-10-CM | POA: Diagnosis not present

## 2021-07-28 DIAGNOSIS — I82441 Acute embolism and thrombosis of right tibial vein: Secondary | ICD-10-CM | POA: Diagnosis not present

## 2021-07-28 DIAGNOSIS — J9621 Acute and chronic respiratory failure with hypoxia: Secondary | ICD-10-CM | POA: Diagnosis not present

## 2021-07-28 DIAGNOSIS — M5136 Other intervertebral disc degeneration, lumbar region: Secondary | ICD-10-CM | POA: Diagnosis not present

## 2021-07-28 DIAGNOSIS — G4730 Sleep apnea, unspecified: Secondary | ICD-10-CM | POA: Diagnosis not present

## 2021-07-28 DIAGNOSIS — I110 Hypertensive heart disease with heart failure: Secondary | ICD-10-CM | POA: Diagnosis not present

## 2021-07-28 DIAGNOSIS — I700 Atherosclerosis of aorta: Secondary | ICD-10-CM | POA: Diagnosis not present

## 2021-07-28 DIAGNOSIS — M1990 Unspecified osteoarthritis, unspecified site: Secondary | ICD-10-CM | POA: Diagnosis not present

## 2021-07-28 DIAGNOSIS — I739 Peripheral vascular disease, unspecified: Secondary | ICD-10-CM | POA: Diagnosis not present

## 2021-07-28 DIAGNOSIS — E785 Hyperlipidemia, unspecified: Secondary | ICD-10-CM | POA: Diagnosis not present

## 2021-07-28 DIAGNOSIS — I5032 Chronic diastolic (congestive) heart failure: Secondary | ICD-10-CM | POA: Diagnosis not present

## 2021-07-28 DIAGNOSIS — G629 Polyneuropathy, unspecified: Secondary | ICD-10-CM | POA: Diagnosis not present

## 2021-07-28 DIAGNOSIS — I872 Venous insufficiency (chronic) (peripheral): Secondary | ICD-10-CM | POA: Diagnosis not present

## 2021-07-29 ENCOUNTER — Ambulatory Visit: Payer: PPO | Admitting: Pulmonary Disease

## 2021-07-29 ENCOUNTER — Other Ambulatory Visit: Payer: Self-pay

## 2021-07-29 ENCOUNTER — Encounter: Payer: Self-pay | Admitting: Pulmonary Disease

## 2021-07-29 VITALS — BP 118/60 | HR 72 | Temp 98.1°F | Ht 67.0 in | Wt 207.0 lb

## 2021-07-29 DIAGNOSIS — G4733 Obstructive sleep apnea (adult) (pediatric): Secondary | ICD-10-CM | POA: Diagnosis not present

## 2021-07-29 DIAGNOSIS — I2692 Saddle embolus of pulmonary artery without acute cor pulmonale: Secondary | ICD-10-CM

## 2021-07-29 DIAGNOSIS — R0902 Hypoxemia: Secondary | ICD-10-CM | POA: Diagnosis not present

## 2021-07-29 MED ORDER — APIXABAN 5 MG PO TABS: 5.0000 mg | ORAL_TABLET | Freq: Two times a day (BID) | ORAL | 1 refills | Status: DC

## 2021-07-29 NOTE — Progress Notes (Signed)
Subjective:   PATIENT ID: Stephanie Alvarado GENDER: female DOB: [birth year], MRN: [id number]   HPI  Chief Complaint  Patient presents with   Hospitalization Follow-up    Blood clots both lungs and rt leg    Reason for Visit: Hospital Follow-up   Stephanie Alvarado is a 81 year old female with OSA and submassive pulmonary embolism who presents as hospital follow-up.  She was recently admitted for diverticulitis.  She was also found with acute bilateral submassive pulmonary embolism and right lower extremity DVT.  Discharge summary for hospitalization from 1/24 to 07/19/2021 was reviewed. She was sent home with oxygen.  Since discharge, she has not been ambulatory due to weakness. She is starting to work with Physical Therapy. Before the hospitalization she was previously had balance issues but able to perform ADLs and now she is struggling with sitting up without help. Planning for PT sessions twice a week. She is wearing her oxygen during her sessions. She is compliant with her CPAP nightly.   Social History: Quit smoking in 2000. 40 pack-years. I have personally reviewed patient's past medical/family/social history, allergies, current medications.  Past Medical History:  Diagnosis Date   Achilles tendon rupture, right, initial encounter    Allergic rhinitis, cause unspecified    Anemia, unspecified    Anxiety state, unspecified    Atherosclerosis of aorta ([place])    TEE, June, 2010, grade 4 aortic arch atherosclerosis , Dr. Aundra Dubin   CAD (coronary artery disease)    DES and circumflex 2006 /  DES to LAD 2011   Carotid artery disease ([place])    Doppler, November, 2011, stable, 0-39% bilateral, turbulent flow left subclavian   Chronic diastolic heart failure ([place])    Cyst of thyroid    Diverticulosis of colon (without mention of hemorrhage)    Dysphasia    Possibly esophageal stricture   Esophageal reflux    Hiatal hernia    Hyperlipemia    Hypertension    Lumbago    Lumbar disc  disease    MVP (mitral valve prolapse)    Neuropathy    Nonspecific abnormal finding in stool contents    Obesity, unspecified    Osteoarthrosis, unspecified whether generalized or localized, unspecified site    Osteopenia    Other diseases of lung, not elsewhere classified    Peripheral vascular disease, unspecified ([place])    Personal history of colonic polyps    ADENOMATOUS POLYP   PFO (patent foramen ovale)    Patient had a very small PFO by TEE 2010.  This was seen only by bubble analysis during Valsalva   PONV (postoperative nausea and vomiting)    Sleep apnea    uses CPAP nightly   Sleep related hypoventilation/hypoxemia in conditions classifiable elsewhere    Stroke [facility]) 11/2008   Treated with TPA? 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 Victorino Dike.   Unspecified cerebral artery occlusion with cerebral infarction    Unspecified venous (peripheral) insufficiency    Vitamin B 12 deficiency      Family History  Problem Relation Age of Onset   Heart disease Father    Heart attack Father    Alzheimer's disease Mother    Colon cancer Paternal Aunt    Breast cancer Other        cousions   Esophageal cancer Neg Hx  Rectal cancer Neg Hx    Stomach cancer Neg Hx      Social History   Occupational History   Occupation: Retired    Fish farm manager: RETIRED  Tobacco Use   Smoking status: Former    Packs/day: 1.00    Years: 40.00    Pack years: 40.00    Types: Cigarettes    Quit date: 06/13/1998    Years since quitting: 23.1   Smokeless tobacco: Never  Vaping Use   Vaping Use: Never used  Substance and Sexual Activity   Alcohol use: No    Alcohol/week: 0.0 standard drinks   Drug use: No   Sexual activity: Not on file    Allergies  Allergen Reactions   Codeine Itching    REACTION: itch     Outpatient Medications Prior to Visit  Medication Sig Dispense Refill    albuterol (VENTOLIN HFA) 108 (90 Base) MCG/ACT inhaler Inhale 2 puffs into the lungs in the morning, at noon, and at bedtime for 4 days, THEN 2 puffs every 6 (six) hours as needed for up to 4 days for wheezing or shortness of breath. 8 g 2   amLODipine (NORVASC) 5 MG tablet Take 5 mg by mouth every morning.     apixaban (ELIQUIS) 5 MG TABS tablet Take 2 tablets (10 mg total) by mouth 2 (two) times daily for 4 days, THEN 1 tablet (5 mg total) 2 (two) times daily. 60 tablet 1   bisacodyl (DULCOLAX) 10 MG suppository Place 1 suppository (10 mg total) rectally daily as needed for moderate constipation. (Patient not taking: Reported on 07/06/2021) 12 suppository 0   budesonide-formoterol (SYMBICORT) 160-4.5 MCG/ACT inhaler Inhale 2 puffs into the lungs 2 (two) times daily. 6 g 2   buPROPion (WELLBUTRIN XL) 150 MG 24 hr tablet Take 300 mg by mouth daily.     docusate sodium (COLACE) 100 MG capsule Take 100 mg by mouth daily as needed for mild constipation.     fluticasone (FLONASE) 50 MCG/ACT nasal spray Place 2 sprays into both nostrils daily. 9.9 mL 1   furosemide (LASIX) 40 MG tablet Take 40 mg by mouth daily as needed for edema.     gabapentin (NEURONTIN) 300 MG capsule Take 600 mg by mouth 3 (three) times daily.      isosorbide mononitrate (IMDUR) 30 MG 24 hr tablet Take 0.5 tablets (15 mg total) by mouth at bedtime. 30 tablet 1   linaclotide (LINZESS) 145 MCG CAPS capsule Take 1 capsule (145 mcg total) by mouth daily before breakfast. (Patient not taking: Reported on 07/06/2021) 90 capsule 3   loratadine (CLARITIN) 10 MG tablet Take 1 tablet (10 mg total) by mouth daily. 30 tablet 1   metoprolol tartrate (LOPRESSOR) 25 MG tablet Take 0.5 tablets (12.5 mg total) by mouth 2 (two) times daily. 30 tablet 2   Multiple Vitamin (MULTIVITAMIN) capsule Take 1 capsule by mouth daily.     mupirocin ointment (BACTROBAN) 2 % Apply 1 application topically daily.     nitroGLYCERIN (NITROSTAT) 0.4 MG SL tablet Place  0.4 mg under the tongue every 5 (five) minutes as needed for chest pain (TAKE UP TO 3 DOSES BEFORE CALLING 911). (Patient not taking: Reported on 07/06/2021)     pantoprazole (PROTONIX) 40 MG tablet Take 40 mg by mouth 2 (two) times daily.     PFIZER COVID-19 VAC BIVALENT injection      polyethylene glycol powder (GLYCOLAX/MIRALAX) powder Take 17 g by mouth daily as needed for moderate constipation.  polyvinyl alcohol (LIQUIFILM TEARS) 1.4 % ophthalmic solution Place 1 drop into both eyes as needed for dry eyes.     rosuvastatin (CRESTOR) 10 MG tablet Take 10 mg by mouth at bedtime.     tiotropium (SPIRIVA HANDIHALER) 18 MCG inhalation capsule Place 1 capsule (18 mcg total) into inhaler and inhale daily. 30 capsule 2   No facility-administered medications prior to visit.    Review of Systems  Constitutional:  Negative for chills, diaphoresis, fever, malaise/fatigue and weight loss.  HENT:  Negative for congestion.   Respiratory:  Negative for cough, hemoptysis, sputum production, shortness of breath and wheezing.   Cardiovascular:  Negative for chest pain, palpitations and leg swelling.  Neurological:  Positive for weakness. Negative for focal weakness.    Objective:   Vitals:   07/29/21 1138  BP: 118/60  Pulse: 72  Temp: 98.1 F (36.7 C)  TempSrc: Oral  SpO2: 94%  Weight: 207 lb (93.9 kg)  Height: 5\' 7"  (1.702 m)      Physical Exam: General: Well-appearing, no acute distress HENT: [place], AT Eyes: EOMI, no scleral icterus Respiratory: Clear to auscultation bilaterally.  No crackles, wheezing or rales Cardiovascular: RRR, -M/R/G, no JVD Extremities:-Edema,-tenderness Neuro: AAO x4, CNII-XII grossly intact Psych: Normal mood, normal affect   Data Reviewed:  Imaging: CTA 07/13/21 Acute bilateral pulmonary embolus in lobar branches of RUL, RML, LUE LLL  PFT: None on file  Labs: CBC    Component Value Date/Time   WBC 9.3 07/19/2021 0451   RBC 4.26 07/19/2021 0451    HGB 11.7 (L) 07/19/2021 0451   HGB 12.3 04/06/2017 1135   HCT 38.7 07/19/2021 0451   HCT 37.0 04/06/2017 1135   PLT 177 07/19/2021 0451   PLT 190 04/06/2017 1135   MCV 90.8 07/19/2021 0451   MCV 91 04/06/2017 1135   MCH 27.5 07/19/2021 0451   MCHC 30.2 07/19/2021 0451   RDW 18.4 (H) 07/19/2021 0451   RDW 15.6 (H) 04/06/2017 1135   LYMPHSABS 1.3 07/14/2021 0458   LYMPHSABS 1.6 04/06/2017 1135   MONOABS 0.8 07/14/2021 0458   EOSABS 0.3 07/14/2021 0458   EOSABS 0.2 04/06/2017 1135   BASOSABS 0.1 07/14/2021 0458   BASOSABS 0.0 04/06/2017 1135    LE Dopplers: Acute R DVT present  Echo EF 60-65%. Grade I DD. No valvular or WMA    Assessment & Plan:   Discussion: 81 year old female with OSA and submassive pulmonary embolism who presents as hospital follow-up.   Acute bilateral submassive pulmonary embolism Right lower extremity DVT Small right pleural effusion CONTINUE Eliquis for 3-6 months. REFILL CT Chest without contrast (last week of March)   Acute on chronic hypoxemic respiratory failure Continue supplemental oxygen goal >88% Will recheck ambulatory O2 in 2 months  Nocturnal hypoxemia 2/2 OSA Continue CPAP nightly Will need a sleep study in the future if she needs a new machine  Deconditioning Agree with PT  Health Maintenance Immunization History  Administered Date(s) Administered   Influenza Whole 03/05/2008, 03/06/2009, 05/11/2010   PFIZER(Purple Top)SARS-COV-2 Vaccination 08/10/2019   Pneumococcal Polysaccharide-23 05/11/2010   CT Lung Screen - not qualified   Orders Placed This Encounter  Procedures   CT Chest Wo Contrast    [place]/TR HTA     Standing Status:   Future    Standing Expiration Date:   07/29/2022    Scheduling Instructions:     Schedule last week of March    Order Specific Question:   Preferred imaging location?  Answer:   [facility] ordered this encounter  Medications   apixaban (ELIQUIS) 5 MG TABS tablet     Sig: Take 1 tablet (5 mg total) by mouth 2 (two) times daily.    Dispense:  60 tablet    Refill:  1    Return in about 6 weeks (around 09/09/2021).  I have spent a total time of 35-minutes on the day of the appointment reviewing prior documentation, coordinating care and discussing medical diagnosis and plan with the patient/family. Imaging, labs and tests included in this note have been reviewed and interpreted independently by me.  [place], MD [facility] 07/29/2021 8:57 AM  Office Number [phone]

## 2021-07-29 NOTE — Patient Instructions (Addendum)
Acute bilateral submassive pulmonary embolism Right lower extremity DVT Small right pleural effusion CONTINUE Eliquis for 3-6 months. REFILL ORDER CT Chest without contrast (last week of March)   Acute on chronic hypoxemic respiratory failure Continue supplemental oxygen goal >88% Will recheck ambulatory O2 in 2 months  Nocturnal hypoxemia 2/2 OSA Continue CPAP nightly Will need a sleep study in the future if she needs a new machine  Follow-up with me at the end of March after CT Chest

## 2021-07-30 DIAGNOSIS — I700 Atherosclerosis of aorta: Secondary | ICD-10-CM | POA: Diagnosis not present

## 2021-07-30 DIAGNOSIS — E041 Nontoxic single thyroid nodule: Secondary | ICD-10-CM | POA: Diagnosis not present

## 2021-07-30 DIAGNOSIS — J9621 Acute and chronic respiratory failure with hypoxia: Secondary | ICD-10-CM | POA: Diagnosis not present

## 2021-07-30 DIAGNOSIS — I82441 Acute embolism and thrombosis of right tibial vein: Secondary | ICD-10-CM | POA: Diagnosis not present

## 2021-07-30 DIAGNOSIS — R4702 Dysphasia: Secondary | ICD-10-CM | POA: Diagnosis not present

## 2021-07-30 DIAGNOSIS — M1990 Unspecified osteoarthritis, unspecified site: Secondary | ICD-10-CM | POA: Diagnosis not present

## 2021-07-30 DIAGNOSIS — G629 Polyneuropathy, unspecified: Secondary | ICD-10-CM | POA: Diagnosis not present

## 2021-07-30 DIAGNOSIS — E538 Deficiency of other specified B group vitamins: Secondary | ICD-10-CM | POA: Diagnosis not present

## 2021-07-30 DIAGNOSIS — I739 Peripheral vascular disease, unspecified: Secondary | ICD-10-CM | POA: Diagnosis not present

## 2021-07-30 DIAGNOSIS — E785 Hyperlipidemia, unspecified: Secondary | ICD-10-CM | POA: Diagnosis not present

## 2021-07-30 DIAGNOSIS — K5700 Diverticulitis of small intestine with perforation and abscess without bleeding: Secondary | ICD-10-CM | POA: Diagnosis not present

## 2021-07-30 DIAGNOSIS — I110 Hypertensive heart disease with heart failure: Secondary | ICD-10-CM | POA: Diagnosis not present

## 2021-07-30 DIAGNOSIS — D696 Thrombocytopenia, unspecified: Secondary | ICD-10-CM | POA: Diagnosis not present

## 2021-07-30 DIAGNOSIS — M5136 Other intervertebral disc degeneration, lumbar region: Secondary | ICD-10-CM | POA: Diagnosis not present

## 2021-07-30 DIAGNOSIS — I872 Venous insufficiency (chronic) (peripheral): Secondary | ICD-10-CM | POA: Diagnosis not present

## 2021-07-30 DIAGNOSIS — I6529 Occlusion and stenosis of unspecified carotid artery: Secondary | ICD-10-CM | POA: Diagnosis not present

## 2021-07-30 DIAGNOSIS — I2510 Atherosclerotic heart disease of native coronary artery without angina pectoris: Secondary | ICD-10-CM | POA: Diagnosis not present

## 2021-07-30 DIAGNOSIS — I341 Nonrheumatic mitral (valve) prolapse: Secondary | ICD-10-CM | POA: Diagnosis not present

## 2021-07-30 DIAGNOSIS — G4730 Sleep apnea, unspecified: Secondary | ICD-10-CM | POA: Diagnosis not present

## 2021-07-30 DIAGNOSIS — D649 Anemia, unspecified: Secondary | ICD-10-CM | POA: Diagnosis not present

## 2021-07-30 DIAGNOSIS — F419 Anxiety disorder, unspecified: Secondary | ICD-10-CM | POA: Diagnosis not present

## 2021-07-30 DIAGNOSIS — I2699 Other pulmonary embolism without acute cor pulmonale: Secondary | ICD-10-CM | POA: Diagnosis not present

## 2021-07-30 DIAGNOSIS — N179 Acute kidney failure, unspecified: Secondary | ICD-10-CM | POA: Diagnosis not present

## 2021-07-30 DIAGNOSIS — I5032 Chronic diastolic (congestive) heart failure: Secondary | ICD-10-CM | POA: Diagnosis not present

## 2021-07-31 ENCOUNTER — Encounter: Payer: Self-pay | Admitting: Pulmonary Disease

## 2021-08-02 DIAGNOSIS — E538 Deficiency of other specified B group vitamins: Secondary | ICD-10-CM | POA: Diagnosis not present

## 2021-08-02 DIAGNOSIS — K5700 Diverticulitis of small intestine with perforation and abscess without bleeding: Secondary | ICD-10-CM | POA: Diagnosis not present

## 2021-08-02 DIAGNOSIS — M1990 Unspecified osteoarthritis, unspecified site: Secondary | ICD-10-CM | POA: Diagnosis not present

## 2021-08-02 DIAGNOSIS — I872 Venous insufficiency (chronic) (peripheral): Secondary | ICD-10-CM | POA: Diagnosis not present

## 2021-08-02 DIAGNOSIS — I82441 Acute embolism and thrombosis of right tibial vein: Secondary | ICD-10-CM | POA: Diagnosis not present

## 2021-08-02 DIAGNOSIS — I110 Hypertensive heart disease with heart failure: Secondary | ICD-10-CM | POA: Diagnosis not present

## 2021-08-02 DIAGNOSIS — J9621 Acute and chronic respiratory failure with hypoxia: Secondary | ICD-10-CM | POA: Diagnosis not present

## 2021-08-02 DIAGNOSIS — G629 Polyneuropathy, unspecified: Secondary | ICD-10-CM | POA: Diagnosis not present

## 2021-08-02 DIAGNOSIS — N179 Acute kidney failure, unspecified: Secondary | ICD-10-CM | POA: Diagnosis not present

## 2021-08-02 DIAGNOSIS — M5136 Other intervertebral disc degeneration, lumbar region: Secondary | ICD-10-CM | POA: Diagnosis not present

## 2021-08-02 DIAGNOSIS — I2699 Other pulmonary embolism without acute cor pulmonale: Secondary | ICD-10-CM | POA: Diagnosis not present

## 2021-08-02 DIAGNOSIS — I700 Atherosclerosis of aorta: Secondary | ICD-10-CM | POA: Diagnosis not present

## 2021-08-02 DIAGNOSIS — I6529 Occlusion and stenosis of unspecified carotid artery: Secondary | ICD-10-CM | POA: Diagnosis not present

## 2021-08-02 DIAGNOSIS — E041 Nontoxic single thyroid nodule: Secondary | ICD-10-CM | POA: Diagnosis not present

## 2021-08-02 DIAGNOSIS — D696 Thrombocytopenia, unspecified: Secondary | ICD-10-CM | POA: Diagnosis not present

## 2021-08-02 DIAGNOSIS — I341 Nonrheumatic mitral (valve) prolapse: Secondary | ICD-10-CM | POA: Diagnosis not present

## 2021-08-02 DIAGNOSIS — D649 Anemia, unspecified: Secondary | ICD-10-CM | POA: Diagnosis not present

## 2021-08-02 DIAGNOSIS — F419 Anxiety disorder, unspecified: Secondary | ICD-10-CM | POA: Diagnosis not present

## 2021-08-02 DIAGNOSIS — E785 Hyperlipidemia, unspecified: Secondary | ICD-10-CM | POA: Diagnosis not present

## 2021-08-02 DIAGNOSIS — I739 Peripheral vascular disease, unspecified: Secondary | ICD-10-CM | POA: Diagnosis not present

## 2021-08-02 DIAGNOSIS — G4730 Sleep apnea, unspecified: Secondary | ICD-10-CM | POA: Diagnosis not present

## 2021-08-02 DIAGNOSIS — I5032 Chronic diastolic (congestive) heart failure: Secondary | ICD-10-CM | POA: Diagnosis not present

## 2021-08-02 DIAGNOSIS — I2510 Atherosclerotic heart disease of native coronary artery without angina pectoris: Secondary | ICD-10-CM | POA: Diagnosis not present

## 2021-08-02 DIAGNOSIS — R4702 Dysphasia: Secondary | ICD-10-CM | POA: Diagnosis not present

## 2021-08-03 NOTE — TOC Benefit Eligibility Note (Signed)
Transition of Care [facility]) Benefit Eligibility Note    Patient Details  Name: LUDELL ZACARIAS MRN: [id number] Date of Birth: April 02, 1941   Medication/Dose: eliquis/apixaban 12m po qd x30days;xarelto/rivaroxaban 28mpo qd x30days dx;PE  Covered?: Yes  Tier: 3 Drug  Prescription Coverage Preferred Pharmacy: local  Spoke with Person/Company/Phone Number:: [employer] [phone]  Co-Pay: $45.00 for both Medications  Prior Approval: No  Deductible: Met       FuKerin [place]enhone Number: 08/03/2021, 1:24 PM

## 2021-08-04 DIAGNOSIS — I2510 Atherosclerotic heart disease of native coronary artery without angina pectoris: Secondary | ICD-10-CM | POA: Diagnosis not present

## 2021-08-04 DIAGNOSIS — K5700 Diverticulitis of small intestine with perforation and abscess without bleeding: Secondary | ICD-10-CM | POA: Diagnosis not present

## 2021-08-04 DIAGNOSIS — G629 Polyneuropathy, unspecified: Secondary | ICD-10-CM | POA: Diagnosis not present

## 2021-08-04 DIAGNOSIS — I6529 Occlusion and stenosis of unspecified carotid artery: Secondary | ICD-10-CM | POA: Diagnosis not present

## 2021-08-04 DIAGNOSIS — I5032 Chronic diastolic (congestive) heart failure: Secondary | ICD-10-CM | POA: Diagnosis not present

## 2021-08-04 DIAGNOSIS — E785 Hyperlipidemia, unspecified: Secondary | ICD-10-CM | POA: Diagnosis not present

## 2021-08-04 DIAGNOSIS — E041 Nontoxic single thyroid nodule: Secondary | ICD-10-CM | POA: Diagnosis not present

## 2021-08-04 DIAGNOSIS — E538 Deficiency of other specified B group vitamins: Secondary | ICD-10-CM | POA: Diagnosis not present

## 2021-08-04 DIAGNOSIS — I341 Nonrheumatic mitral (valve) prolapse: Secondary | ICD-10-CM | POA: Diagnosis not present

## 2021-08-04 DIAGNOSIS — N179 Acute kidney failure, unspecified: Secondary | ICD-10-CM | POA: Diagnosis not present

## 2021-08-04 DIAGNOSIS — M1990 Unspecified osteoarthritis, unspecified site: Secondary | ICD-10-CM | POA: Diagnosis not present

## 2021-08-04 DIAGNOSIS — M5136 Other intervertebral disc degeneration, lumbar region: Secondary | ICD-10-CM | POA: Diagnosis not present

## 2021-08-04 DIAGNOSIS — F419 Anxiety disorder, unspecified: Secondary | ICD-10-CM | POA: Diagnosis not present

## 2021-08-04 DIAGNOSIS — I872 Venous insufficiency (chronic) (peripheral): Secondary | ICD-10-CM | POA: Diagnosis not present

## 2021-08-04 DIAGNOSIS — D696 Thrombocytopenia, unspecified: Secondary | ICD-10-CM | POA: Diagnosis not present

## 2021-08-04 DIAGNOSIS — I700 Atherosclerosis of aorta: Secondary | ICD-10-CM | POA: Diagnosis not present

## 2021-08-04 DIAGNOSIS — J9621 Acute and chronic respiratory failure with hypoxia: Secondary | ICD-10-CM | POA: Diagnosis not present

## 2021-08-04 DIAGNOSIS — R4702 Dysphasia: Secondary | ICD-10-CM | POA: Diagnosis not present

## 2021-08-04 DIAGNOSIS — I739 Peripheral vascular disease, unspecified: Secondary | ICD-10-CM | POA: Diagnosis not present

## 2021-08-04 DIAGNOSIS — I82441 Acute embolism and thrombosis of right tibial vein: Secondary | ICD-10-CM | POA: Diagnosis not present

## 2021-08-04 DIAGNOSIS — I110 Hypertensive heart disease with heart failure: Secondary | ICD-10-CM | POA: Diagnosis not present

## 2021-08-04 DIAGNOSIS — D649 Anemia, unspecified: Secondary | ICD-10-CM | POA: Diagnosis not present

## 2021-08-04 DIAGNOSIS — I2699 Other pulmonary embolism without acute cor pulmonale: Secondary | ICD-10-CM | POA: Diagnosis not present

## 2021-08-04 DIAGNOSIS — G4730 Sleep apnea, unspecified: Secondary | ICD-10-CM | POA: Diagnosis not present

## 2021-08-09 DIAGNOSIS — M545 Low back pain, unspecified: Secondary | ICD-10-CM | POA: Diagnosis not present

## 2021-08-09 DIAGNOSIS — M546 Pain in thoracic spine: Secondary | ICD-10-CM | POA: Diagnosis not present

## 2021-08-09 DIAGNOSIS — M791 Myalgia, unspecified site: Secondary | ICD-10-CM | POA: Diagnosis not present

## 2021-08-10 DIAGNOSIS — I129 Hypertensive chronic kidney disease with stage 1 through stage 4 chronic kidney disease, or unspecified chronic kidney disease: Secondary | ICD-10-CM | POA: Diagnosis not present

## 2021-08-10 DIAGNOSIS — N183 Chronic kidney disease, stage 3 unspecified: Secondary | ICD-10-CM | POA: Diagnosis not present

## 2021-08-10 DIAGNOSIS — H40023 Open angle with borderline findings, high risk, bilateral: Secondary | ICD-10-CM | POA: Diagnosis not present

## 2021-08-11 DIAGNOSIS — S2232XA Fracture of one rib, left side, initial encounter for closed fracture: Secondary | ICD-10-CM | POA: Diagnosis not present

## 2021-08-11 DIAGNOSIS — S0990XA Unspecified injury of head, initial encounter: Secondary | ICD-10-CM | POA: Diagnosis not present

## 2021-08-11 DIAGNOSIS — I2699 Other pulmonary embolism without acute cor pulmonale: Secondary | ICD-10-CM | POA: Diagnosis not present

## 2021-08-11 DIAGNOSIS — R079 Chest pain, unspecified: Secondary | ICD-10-CM | POA: Diagnosis not present

## 2021-08-11 DIAGNOSIS — T148XXA Other injury of unspecified body region, initial encounter: Secondary | ICD-10-CM | POA: Diagnosis not present

## 2021-08-11 DIAGNOSIS — M25562 Pain in left knee: Secondary | ICD-10-CM | POA: Diagnosis not present

## 2021-08-11 DIAGNOSIS — S0093XA Contusion of unspecified part of head, initial encounter: Secondary | ICD-10-CM | POA: Diagnosis not present

## 2021-08-12 ENCOUNTER — Other Ambulatory Visit: Payer: Self-pay

## 2021-08-12 ENCOUNTER — Ambulatory Visit: Payer: PPO | Admitting: Sports Medicine

## 2021-08-12 DIAGNOSIS — D696 Thrombocytopenia, unspecified: Secondary | ICD-10-CM | POA: Diagnosis not present

## 2021-08-12 DIAGNOSIS — M2041 Other hammer toe(s) (acquired), right foot: Secondary | ICD-10-CM

## 2021-08-12 DIAGNOSIS — F419 Anxiety disorder, unspecified: Secondary | ICD-10-CM | POA: Diagnosis not present

## 2021-08-12 DIAGNOSIS — M79675 Pain in left toe(s): Secondary | ICD-10-CM | POA: Diagnosis not present

## 2021-08-12 DIAGNOSIS — M79674 Pain in right toe(s): Secondary | ICD-10-CM

## 2021-08-12 DIAGNOSIS — N179 Acute kidney failure, unspecified: Secondary | ICD-10-CM | POA: Diagnosis not present

## 2021-08-12 DIAGNOSIS — E041 Nontoxic single thyroid nodule: Secondary | ICD-10-CM | POA: Diagnosis not present

## 2021-08-12 DIAGNOSIS — I700 Atherosclerosis of aorta: Secondary | ICD-10-CM | POA: Diagnosis not present

## 2021-08-12 DIAGNOSIS — I6529 Occlusion and stenosis of unspecified carotid artery: Secondary | ICD-10-CM | POA: Diagnosis not present

## 2021-08-12 DIAGNOSIS — I739 Peripheral vascular disease, unspecified: Secondary | ICD-10-CM | POA: Diagnosis not present

## 2021-08-12 DIAGNOSIS — E538 Deficiency of other specified B group vitamins: Secondary | ICD-10-CM | POA: Diagnosis not present

## 2021-08-12 DIAGNOSIS — I2699 Other pulmonary embolism without acute cor pulmonale: Secondary | ICD-10-CM | POA: Diagnosis not present

## 2021-08-12 DIAGNOSIS — I110 Hypertensive heart disease with heart failure: Secondary | ICD-10-CM | POA: Diagnosis not present

## 2021-08-12 DIAGNOSIS — B351 Tinea unguium: Secondary | ICD-10-CM

## 2021-08-12 DIAGNOSIS — I82441 Acute embolism and thrombosis of right tibial vein: Secondary | ICD-10-CM | POA: Diagnosis not present

## 2021-08-12 DIAGNOSIS — G4730 Sleep apnea, unspecified: Secondary | ICD-10-CM | POA: Diagnosis not present

## 2021-08-12 DIAGNOSIS — I5032 Chronic diastolic (congestive) heart failure: Secondary | ICD-10-CM | POA: Diagnosis not present

## 2021-08-12 DIAGNOSIS — M1990 Unspecified osteoarthritis, unspecified site: Secondary | ICD-10-CM | POA: Diagnosis not present

## 2021-08-12 DIAGNOSIS — G629 Polyneuropathy, unspecified: Secondary | ICD-10-CM | POA: Diagnosis not present

## 2021-08-12 DIAGNOSIS — I2510 Atherosclerotic heart disease of native coronary artery without angina pectoris: Secondary | ICD-10-CM | POA: Diagnosis not present

## 2021-08-12 DIAGNOSIS — K5700 Diverticulitis of small intestine with perforation and abscess without bleeding: Secondary | ICD-10-CM | POA: Diagnosis not present

## 2021-08-12 DIAGNOSIS — L84 Corns and callosities: Secondary | ICD-10-CM

## 2021-08-12 DIAGNOSIS — E785 Hyperlipidemia, unspecified: Secondary | ICD-10-CM | POA: Diagnosis not present

## 2021-08-12 DIAGNOSIS — R4702 Dysphasia: Secondary | ICD-10-CM | POA: Diagnosis not present

## 2021-08-12 DIAGNOSIS — D649 Anemia, unspecified: Secondary | ICD-10-CM | POA: Diagnosis not present

## 2021-08-12 DIAGNOSIS — J9621 Acute and chronic respiratory failure with hypoxia: Secondary | ICD-10-CM | POA: Diagnosis not present

## 2021-08-12 DIAGNOSIS — I341 Nonrheumatic mitral (valve) prolapse: Secondary | ICD-10-CM | POA: Diagnosis not present

## 2021-08-12 DIAGNOSIS — M5136 Other intervertebral disc degeneration, lumbar region: Secondary | ICD-10-CM | POA: Diagnosis not present

## 2021-08-12 DIAGNOSIS — I872 Venous insufficiency (chronic) (peripheral): Secondary | ICD-10-CM | POA: Diagnosis not present

## 2021-08-12 NOTE — Progress Notes (Signed)
Subjective: ?Stephanie Alvarado is a 81 y.o. female patient seen in office for sore at right third toe reports that she was soaking in Epsom salt and this seemed to help but then slowly started back hurting states that she has been admitted to the hospital and also had a recent fall where she broke 3 ribs and her hand as well and states that since she has been walking more she feels like the toe is rubbing patient denies any significant redness warmth swelling or drainage.  Patient also requests nail trim this visit. ? ?Patient Active Problem List  ? Diagnosis Date Noted  ? Opacity of lung on imaging study 07/13/2021  ? Acute saddle pulmonary embolism ([place]) 07/12/2021  ? Acute on chronic respiratory failure with hypoxia ([place]) 07/12/2021  ? Chest pain 07/11/2021  ? Daytime sleepiness 07/10/2021  ? AKI (acute kidney injury) ([place]) 07/08/2021  ? Chronic diastolic CHF (congestive heart failure) ([place]) 07/07/2021  ? Thrombocytopenia ([place]) 07/07/2021  ? Chronic respiratory failure with hypoxia, on home oxygen therapy ([place]) 07/07/2021  ? Hypokalemia 07/07/2021  ? Primary osteoarthritis of left knee 12/23/2019  ? Diarrhea 09/06/2017  ? Chronic RLQ pain 05/26/2017  ? Lightheadedness   ? Ataxia 05/30/2016  ? Dizziness 05/30/2016  ? Preoperative clearance 11/10/2015  ? Varicose veins of left lower extremity with complications 48/54/6270  ? Varicose veins of leg with complications 35/00/9381  ? Lumbar scoliosis 11/21/2013  ? Preop cardiovascular exam 10/29/2013  ? LLQ abdominal pain 04/05/2013  ? Diverticulitis 04/05/2013  ? Palpitations   ? Subclavian artery disease ([place])   ? PFO (patent foramen ovale)   ? CAD (coronary artery disease)   ? Ejection fraction   ? Atherosclerosis of aorta ([place])   ? Carotid artery disease ([place])   ? 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 Chronic constipation 06/11/2010  ? ABDOMINAL PAIN-LLQ 06/11/2010  ? History of adenomatous polyp of colon 06/11/2010  ? OSA (obstructive sleep apnea) 07/17/2009  ?  Stroke ([place]) 11/11/2008  ? COLONIC POLYPS 08/31/2008  ? Thyroid nodule 08/31/2008  ? VENOUS INSUFFICIENCY 08/31/2008  ? DIVERTICULOSIS OF COLON 08/31/2008  ? LOW BACK PAIN, CHRONIC 08/31/2008  ? FIBROMYALGIA 08/31/2008  ? Dyspnea 08/08/2008  ? HYPERCHOLESTEROLEMIA 04/19/2007  ? OBESITY 04/19/2007  ? ANEMIA 04/19/2007  ? ANXIETY 04/19/2007  ? Essential hypertension 04/19/2007  ? ALLERGIC RHINITIS 04/19/2007  ? PULMONARY NODULE, RIGHT LOWER LOBE 04/19/2007  ? GERD 04/19/2007  ? OA (osteoarthritis) of knee 04/19/2007  ? ?Current Outpatient Medications on File Prior to Visit  ?Medication Sig Dispense Refill  ? albuterol (VENTOLIN HFA) 108 (90 Base) MCG/ACT inhaler Inhale 2 puffs into the lungs in the morning, at noon, and at bedtime for 4 days, THEN 2 puffs every 6 (six) hours as needed for up to 4 days for wheezing or shortness of breath. 8 g 2  ? amLODipine (NORVASC) 5 MG tablet Take 5 mg by mouth every morning.    ? apixaban (ELIQUIS) 5 MG TABS tablet Take 2 tablets (10 mg total) by mouth 2 (two) times daily for 4 days, THEN 1 tablet (5 mg total) 2 (two) times daily. 60 tablet 1  ? apixaban (ELIQUIS) 5 MG TABS tablet Take 1 tablet (5 mg total) by mouth 2 (two) times daily. 60 tablet 1  ? bisacodyl (DULCOLAX) 10 MG suppository Place 1 suppository (10 mg total) rectally daily as needed for moderate constipation. 12 suppository 0  ? budesonide-formoterol (SYMBICORT) 160-4.5 MCG/ACT inhaler Inhale 2 puffs into the lungs  2 (two) times daily. 6 g 2  ? buPROPion (WELLBUTRIN XL) 150 MG 24 hr tablet Take 300 mg by mouth daily.    ? cyclobenzaprine (FLEXERIL) 10 MG tablet cyclobenzaprine 10 mg tablet    ? cycloSPORINE (RESTASIS) 0.05 % ophthalmic emulsion SMARTSIG:In Eye(s)    ? docusate sodium (COLACE) 100 MG capsule Take 100 mg by mouth daily as needed for mild constipation.    ? fluticasone (FLONASE) 50 MCG/ACT nasal spray Place 2 sprays into both nostrils daily. 9.9 mL 1  ? furosemide (LASIX) 40 MG tablet Take 40 mg by  mouth daily as needed for edema.    ? gabapentin (NEURONTIN) 300 MG capsule Take 600 mg by mouth 3 (three) times daily.     ? isosorbide mononitrate (IMDUR) 30 MG 24 hr tablet Take 0.5 tablets (15 mg total) by mouth at bedtime. 30 tablet 1  ? latanoprost (XALATAN) 0.005 % ophthalmic solution SMARTSIG:In Eye(s)    ? linaclotide (LINZESS) 145 MCG CAPS capsule Take 1 capsule (145 mcg total) by mouth daily before breakfast. 90 capsule 3  ? loratadine (CLARITIN) 10 MG tablet Take 1 tablet (10 mg total) by mouth daily. 30 tablet 1  ? metoprolol tartrate (LOPRESSOR) 25 MG tablet Take 0.5 tablets (12.5 mg total) by mouth 2 (two) times daily. 30 tablet 2  ? Multiple Vitamin (MULTIVITAMIN) capsule Take 1 capsule by mouth daily.    ? mupirocin ointment (BACTROBAN) 2 % Apply 1 application topically daily.    ? nitroGLYCERIN (NITROSTAT) 0.4 MG SL tablet Place 0.4 mg under the tongue every 5 (five) minutes as needed for chest pain (TAKE UP TO 3 DOSES BEFORE CALLING 911).    ? pantoprazole (PROTONIX) 40 MG tablet Take 40 mg by mouth 2 (two) times daily.    ? PFIZER COVID-19 VAC BIVALENT injection     ? polyethylene glycol powder (GLYCOLAX/MIRALAX) powder Take 17 g by mouth daily as needed for moderate constipation.     ? polyvinyl alcohol (LIQUIFILM TEARS) 1.4 % ophthalmic solution Place 1 drop into both eyes as needed for dry eyes.    ? predniSONE (DELTASONE) 5 MG tablet Take 10 mg by mouth 2 (two) times daily.    ? rosuvastatin (CRESTOR) 10 MG tablet Take 10 mg by mouth at bedtime.    ? tiotropium (SPIRIVA HANDIHALER) 18 MCG inhalation capsule Place 1 capsule (18 mcg total) into inhaler and inhale daily. 30 capsule 2  ? tiZANidine (ZANAFLEX) 4 MG tablet Take by mouth.    ? traMADol (ULTRAM) 50 MG tablet tramadol 50 mg tablet ? TAKE 1 TO 2 TABLETS BY MOUTH TWICE DAILY AS NEEDED FOR PAIN    ? ?No current facility-administered medications on file prior to visit.  ? ?Allergies  ?Allergen Reactions  ? Codeine Itching  ?  REACTION:  itch  ? ? ? ? ?Objective: ?There were no vitals filed for this visit. ? ?General: Patient is awake, alert, oriented x 3 and in no acute distress. ? ?Dermatology: Skin is warm and dry bilateral with hemorrhagic callus noted at the distal tuft of the right third toe once debrided there was no underlying opening, there is no malodor, no active drainage, no blanchable erythema, minimal edema. No acute signs of infection.  Nails x9 are thickened and elongated consistent with onychomycosis.  Patient has very minimal nail on the left hallux from previous avulsion. ?  ?Vascular: Dorsalis Pedis pulse = 1/4 Bilateral,  Posterior Tibial pulse = 0/4 Bilateral,  Capillary Fill Time <  5 seconds, varicosities noted bilateral. ? ?Neurologic: Gross sensation present via light touch.. ? ?Musculosketal: There is minimal pain to palpation to the right third toe, hammertoe deformity noted. ?  ? ?No results for input(s): GRAMSTAIN, LABORGA in the last 8760 hours. ? ?Assessment and Plan:  ?Problem List Items Addressed This Visit   ?None ?Visit Diagnoses   ? ? Pre-ulcerative calluses    -  Primary  ? Hammer toe of right foot      ? Pain due to onychomycosis of toenails of both feet      ? PVD (peripheral vascular disease) ([place])      ? ?  ? ? ? ?-Examined patient ?-Mechanically debrided all painful nails x9 using a sterile nail nipper without incident ?-Mechanically debrided preulcerative callus to the right third toe using a sterile 15 blade there was no underlying opening noted patient tolerated the debridement of the preulcerative callus at the right third toe distal tuft well without need for anesthesia ?-Dispensed toe crest pad for patient to use daily to prevent rubbing and excessive curling down of the right third toe ?-Advised patient to avoid shoes that could rub toe like previous ?-Advised patient if soreness returns may resume soaking with Epsom salt with assistance ?-Return to office in 3 months for follow up nail and callus  care and evaluation or sooner if problems arise. ? ?Landis Martins, DPM ?

## 2021-08-16 DIAGNOSIS — I6529 Occlusion and stenosis of unspecified carotid artery: Secondary | ICD-10-CM | POA: Diagnosis not present

## 2021-08-16 DIAGNOSIS — G4730 Sleep apnea, unspecified: Secondary | ICD-10-CM | POA: Diagnosis not present

## 2021-08-16 DIAGNOSIS — I739 Peripheral vascular disease, unspecified: Secondary | ICD-10-CM | POA: Diagnosis not present

## 2021-08-16 DIAGNOSIS — I2510 Atherosclerotic heart disease of native coronary artery without angina pectoris: Secondary | ICD-10-CM | POA: Diagnosis not present

## 2021-08-16 DIAGNOSIS — I110 Hypertensive heart disease with heart failure: Secondary | ICD-10-CM | POA: Diagnosis not present

## 2021-08-16 DIAGNOSIS — M1990 Unspecified osteoarthritis, unspecified site: Secondary | ICD-10-CM | POA: Diagnosis not present

## 2021-08-16 DIAGNOSIS — G629 Polyneuropathy, unspecified: Secondary | ICD-10-CM | POA: Diagnosis not present

## 2021-08-16 DIAGNOSIS — R4702 Dysphasia: Secondary | ICD-10-CM | POA: Diagnosis not present

## 2021-08-16 DIAGNOSIS — N179 Acute kidney failure, unspecified: Secondary | ICD-10-CM | POA: Diagnosis not present

## 2021-08-16 DIAGNOSIS — I872 Venous insufficiency (chronic) (peripheral): Secondary | ICD-10-CM | POA: Diagnosis not present

## 2021-08-16 DIAGNOSIS — I700 Atherosclerosis of aorta: Secondary | ICD-10-CM | POA: Diagnosis not present

## 2021-08-16 DIAGNOSIS — E041 Nontoxic single thyroid nodule: Secondary | ICD-10-CM | POA: Diagnosis not present

## 2021-08-16 DIAGNOSIS — E538 Deficiency of other specified B group vitamins: Secondary | ICD-10-CM | POA: Diagnosis not present

## 2021-08-16 DIAGNOSIS — M5136 Other intervertebral disc degeneration, lumbar region: Secondary | ICD-10-CM | POA: Diagnosis not present

## 2021-08-16 DIAGNOSIS — I341 Nonrheumatic mitral (valve) prolapse: Secondary | ICD-10-CM | POA: Diagnosis not present

## 2021-08-16 DIAGNOSIS — J9621 Acute and chronic respiratory failure with hypoxia: Secondary | ICD-10-CM | POA: Diagnosis not present

## 2021-08-16 DIAGNOSIS — F419 Anxiety disorder, unspecified: Secondary | ICD-10-CM | POA: Diagnosis not present

## 2021-08-16 DIAGNOSIS — I82441 Acute embolism and thrombosis of right tibial vein: Secondary | ICD-10-CM | POA: Diagnosis not present

## 2021-08-16 DIAGNOSIS — D649 Anemia, unspecified: Secondary | ICD-10-CM | POA: Diagnosis not present

## 2021-08-16 DIAGNOSIS — E785 Hyperlipidemia, unspecified: Secondary | ICD-10-CM | POA: Diagnosis not present

## 2021-08-16 DIAGNOSIS — K5700 Diverticulitis of small intestine with perforation and abscess without bleeding: Secondary | ICD-10-CM | POA: Diagnosis not present

## 2021-08-16 DIAGNOSIS — I2699 Other pulmonary embolism without acute cor pulmonale: Secondary | ICD-10-CM | POA: Diagnosis not present

## 2021-08-16 DIAGNOSIS — D696 Thrombocytopenia, unspecified: Secondary | ICD-10-CM | POA: Diagnosis not present

## 2021-08-16 DIAGNOSIS — I5032 Chronic diastolic (congestive) heart failure: Secondary | ICD-10-CM | POA: Diagnosis not present

## 2021-08-18 DIAGNOSIS — K5792 Diverticulitis of intestine, part unspecified, without perforation or abscess without bleeding: Secondary | ICD-10-CM | POA: Diagnosis not present

## 2021-08-18 DIAGNOSIS — K5700 Diverticulitis of small intestine with perforation and abscess without bleeding: Secondary | ICD-10-CM | POA: Diagnosis not present

## 2021-08-18 DIAGNOSIS — E785 Hyperlipidemia, unspecified: Secondary | ICD-10-CM | POA: Diagnosis not present

## 2021-08-18 DIAGNOSIS — I110 Hypertensive heart disease with heart failure: Secondary | ICD-10-CM | POA: Diagnosis not present

## 2021-08-18 DIAGNOSIS — J9611 Chronic respiratory failure with hypoxia: Secondary | ICD-10-CM | POA: Diagnosis not present

## 2021-08-18 DIAGNOSIS — E538 Deficiency of other specified B group vitamins: Secondary | ICD-10-CM | POA: Diagnosis not present

## 2021-08-18 DIAGNOSIS — M1990 Unspecified osteoarthritis, unspecified site: Secondary | ICD-10-CM | POA: Diagnosis not present

## 2021-08-18 DIAGNOSIS — I2510 Atherosclerotic heart disease of native coronary artery without angina pectoris: Secondary | ICD-10-CM | POA: Diagnosis not present

## 2021-08-18 DIAGNOSIS — D692 Other nonthrombocytopenic purpura: Secondary | ICD-10-CM | POA: Diagnosis not present

## 2021-08-18 DIAGNOSIS — D649 Anemia, unspecified: Secondary | ICD-10-CM | POA: Diagnosis not present

## 2021-08-18 DIAGNOSIS — G4730 Sleep apnea, unspecified: Secondary | ICD-10-CM | POA: Diagnosis not present

## 2021-08-18 DIAGNOSIS — I6529 Occlusion and stenosis of unspecified carotid artery: Secondary | ICD-10-CM | POA: Diagnosis not present

## 2021-08-18 DIAGNOSIS — M5136 Other intervertebral disc degeneration, lumbar region: Secondary | ICD-10-CM | POA: Diagnosis not present

## 2021-08-18 DIAGNOSIS — I82409 Acute embolism and thrombosis of unspecified deep veins of unspecified lower extremity: Secondary | ICD-10-CM | POA: Diagnosis not present

## 2021-08-18 DIAGNOSIS — D696 Thrombocytopenia, unspecified: Secondary | ICD-10-CM | POA: Diagnosis not present

## 2021-08-18 DIAGNOSIS — I341 Nonrheumatic mitral (valve) prolapse: Secondary | ICD-10-CM | POA: Diagnosis not present

## 2021-08-18 DIAGNOSIS — I5032 Chronic diastolic (congestive) heart failure: Secondary | ICD-10-CM | POA: Diagnosis not present

## 2021-08-18 DIAGNOSIS — I82441 Acute embolism and thrombosis of right tibial vein: Secondary | ICD-10-CM | POA: Diagnosis not present

## 2021-08-18 DIAGNOSIS — I739 Peripheral vascular disease, unspecified: Secondary | ICD-10-CM | POA: Diagnosis not present

## 2021-08-18 DIAGNOSIS — I2699 Other pulmonary embolism without acute cor pulmonale: Secondary | ICD-10-CM | POA: Diagnosis not present

## 2021-08-18 DIAGNOSIS — R4702 Dysphasia: Secondary | ICD-10-CM | POA: Diagnosis not present

## 2021-08-18 DIAGNOSIS — G629 Polyneuropathy, unspecified: Secondary | ICD-10-CM | POA: Diagnosis not present

## 2021-08-18 DIAGNOSIS — N179 Acute kidney failure, unspecified: Secondary | ICD-10-CM | POA: Diagnosis not present

## 2021-08-18 DIAGNOSIS — I700 Atherosclerosis of aorta: Secondary | ICD-10-CM | POA: Diagnosis not present

## 2021-08-18 DIAGNOSIS — E041 Nontoxic single thyroid nodule: Secondary | ICD-10-CM | POA: Diagnosis not present

## 2021-08-18 DIAGNOSIS — F419 Anxiety disorder, unspecified: Secondary | ICD-10-CM | POA: Diagnosis not present

## 2021-08-18 DIAGNOSIS — J9621 Acute and chronic respiratory failure with hypoxia: Secondary | ICD-10-CM | POA: Diagnosis not present

## 2021-08-18 DIAGNOSIS — I872 Venous insufficiency (chronic) (peripheral): Secondary | ICD-10-CM | POA: Diagnosis not present

## 2021-08-25 DIAGNOSIS — G629 Polyneuropathy, unspecified: Secondary | ICD-10-CM | POA: Diagnosis not present

## 2021-08-25 DIAGNOSIS — J9621 Acute and chronic respiratory failure with hypoxia: Secondary | ICD-10-CM | POA: Diagnosis not present

## 2021-08-25 DIAGNOSIS — E041 Nontoxic single thyroid nodule: Secondary | ICD-10-CM | POA: Diagnosis not present

## 2021-08-25 DIAGNOSIS — M1990 Unspecified osteoarthritis, unspecified site: Secondary | ICD-10-CM | POA: Diagnosis not present

## 2021-08-25 DIAGNOSIS — I739 Peripheral vascular disease, unspecified: Secondary | ICD-10-CM | POA: Diagnosis not present

## 2021-08-25 DIAGNOSIS — I110 Hypertensive heart disease with heart failure: Secondary | ICD-10-CM | POA: Diagnosis not present

## 2021-08-25 DIAGNOSIS — I82441 Acute embolism and thrombosis of right tibial vein: Secondary | ICD-10-CM | POA: Diagnosis not present

## 2021-08-25 DIAGNOSIS — E785 Hyperlipidemia, unspecified: Secondary | ICD-10-CM | POA: Diagnosis not present

## 2021-08-25 DIAGNOSIS — I5032 Chronic diastolic (congestive) heart failure: Secondary | ICD-10-CM | POA: Diagnosis not present

## 2021-08-25 DIAGNOSIS — G4730 Sleep apnea, unspecified: Secondary | ICD-10-CM | POA: Diagnosis not present

## 2021-08-25 DIAGNOSIS — I6529 Occlusion and stenosis of unspecified carotid artery: Secondary | ICD-10-CM | POA: Diagnosis not present

## 2021-08-25 DIAGNOSIS — E538 Deficiency of other specified B group vitamins: Secondary | ICD-10-CM | POA: Diagnosis not present

## 2021-08-25 DIAGNOSIS — I872 Venous insufficiency (chronic) (peripheral): Secondary | ICD-10-CM | POA: Diagnosis not present

## 2021-08-25 DIAGNOSIS — M5136 Other intervertebral disc degeneration, lumbar region: Secondary | ICD-10-CM | POA: Diagnosis not present

## 2021-08-25 DIAGNOSIS — I2510 Atherosclerotic heart disease of native coronary artery without angina pectoris: Secondary | ICD-10-CM | POA: Diagnosis not present

## 2021-08-25 DIAGNOSIS — D696 Thrombocytopenia, unspecified: Secondary | ICD-10-CM | POA: Diagnosis not present

## 2021-08-25 DIAGNOSIS — I700 Atherosclerosis of aorta: Secondary | ICD-10-CM | POA: Diagnosis not present

## 2021-08-25 DIAGNOSIS — I2699 Other pulmonary embolism without acute cor pulmonale: Secondary | ICD-10-CM | POA: Diagnosis not present

## 2021-08-25 DIAGNOSIS — R4702 Dysphasia: Secondary | ICD-10-CM | POA: Diagnosis not present

## 2021-08-25 DIAGNOSIS — I341 Nonrheumatic mitral (valve) prolapse: Secondary | ICD-10-CM | POA: Diagnosis not present

## 2021-08-25 DIAGNOSIS — N179 Acute kidney failure, unspecified: Secondary | ICD-10-CM | POA: Diagnosis not present

## 2021-08-25 DIAGNOSIS — K5700 Diverticulitis of small intestine with perforation and abscess without bleeding: Secondary | ICD-10-CM | POA: Diagnosis not present

## 2021-08-25 DIAGNOSIS — F419 Anxiety disorder, unspecified: Secondary | ICD-10-CM | POA: Diagnosis not present

## 2021-08-25 DIAGNOSIS — D649 Anemia, unspecified: Secondary | ICD-10-CM | POA: Diagnosis not present

## 2021-08-26 DIAGNOSIS — G4733 Obstructive sleep apnea (adult) (pediatric): Secondary | ICD-10-CM | POA: Diagnosis not present

## 2021-08-26 DIAGNOSIS — S2020XA Contusion of thorax, unspecified, initial encounter: Secondary | ICD-10-CM | POA: Diagnosis not present

## 2021-08-26 DIAGNOSIS — R0902 Hypoxemia: Secondary | ICD-10-CM | POA: Diagnosis not present

## 2021-08-27 DIAGNOSIS — I6529 Occlusion and stenosis of unspecified carotid artery: Secondary | ICD-10-CM | POA: Diagnosis not present

## 2021-08-27 DIAGNOSIS — J9621 Acute and chronic respiratory failure with hypoxia: Secondary | ICD-10-CM | POA: Diagnosis not present

## 2021-08-27 DIAGNOSIS — G4730 Sleep apnea, unspecified: Secondary | ICD-10-CM | POA: Diagnosis not present

## 2021-08-27 DIAGNOSIS — I5032 Chronic diastolic (congestive) heart failure: Secondary | ICD-10-CM | POA: Diagnosis not present

## 2021-08-27 DIAGNOSIS — M5136 Other intervertebral disc degeneration, lumbar region: Secondary | ICD-10-CM | POA: Diagnosis not present

## 2021-08-27 DIAGNOSIS — D649 Anemia, unspecified: Secondary | ICD-10-CM | POA: Diagnosis not present

## 2021-08-27 DIAGNOSIS — R4702 Dysphasia: Secondary | ICD-10-CM | POA: Diagnosis not present

## 2021-08-27 DIAGNOSIS — I2699 Other pulmonary embolism without acute cor pulmonale: Secondary | ICD-10-CM | POA: Diagnosis not present

## 2021-08-27 DIAGNOSIS — D696 Thrombocytopenia, unspecified: Secondary | ICD-10-CM | POA: Diagnosis not present

## 2021-08-27 DIAGNOSIS — N179 Acute kidney failure, unspecified: Secondary | ICD-10-CM | POA: Diagnosis not present

## 2021-08-27 DIAGNOSIS — I872 Venous insufficiency (chronic) (peripheral): Secondary | ICD-10-CM | POA: Diagnosis not present

## 2021-08-27 DIAGNOSIS — I739 Peripheral vascular disease, unspecified: Secondary | ICD-10-CM | POA: Diagnosis not present

## 2021-08-27 DIAGNOSIS — I110 Hypertensive heart disease with heart failure: Secondary | ICD-10-CM | POA: Diagnosis not present

## 2021-08-27 DIAGNOSIS — E041 Nontoxic single thyroid nodule: Secondary | ICD-10-CM | POA: Diagnosis not present

## 2021-08-27 DIAGNOSIS — I341 Nonrheumatic mitral (valve) prolapse: Secondary | ICD-10-CM | POA: Diagnosis not present

## 2021-08-27 DIAGNOSIS — F419 Anxiety disorder, unspecified: Secondary | ICD-10-CM | POA: Diagnosis not present

## 2021-08-27 DIAGNOSIS — I82441 Acute embolism and thrombosis of right tibial vein: Secondary | ICD-10-CM | POA: Diagnosis not present

## 2021-08-27 DIAGNOSIS — E785 Hyperlipidemia, unspecified: Secondary | ICD-10-CM | POA: Diagnosis not present

## 2021-08-27 DIAGNOSIS — E538 Deficiency of other specified B group vitamins: Secondary | ICD-10-CM | POA: Diagnosis not present

## 2021-08-27 DIAGNOSIS — G629 Polyneuropathy, unspecified: Secondary | ICD-10-CM | POA: Diagnosis not present

## 2021-08-27 DIAGNOSIS — K5700 Diverticulitis of small intestine with perforation and abscess without bleeding: Secondary | ICD-10-CM | POA: Diagnosis not present

## 2021-08-27 DIAGNOSIS — I700 Atherosclerosis of aorta: Secondary | ICD-10-CM | POA: Diagnosis not present

## 2021-08-27 DIAGNOSIS — I2510 Atherosclerotic heart disease of native coronary artery without angina pectoris: Secondary | ICD-10-CM | POA: Diagnosis not present

## 2021-08-27 DIAGNOSIS — M1990 Unspecified osteoarthritis, unspecified site: Secondary | ICD-10-CM | POA: Diagnosis not present

## 2021-08-30 DIAGNOSIS — E041 Nontoxic single thyroid nodule: Secondary | ICD-10-CM | POA: Diagnosis not present

## 2021-08-30 DIAGNOSIS — N179 Acute kidney failure, unspecified: Secondary | ICD-10-CM | POA: Diagnosis not present

## 2021-08-30 DIAGNOSIS — G629 Polyneuropathy, unspecified: Secondary | ICD-10-CM | POA: Diagnosis not present

## 2021-08-30 DIAGNOSIS — I5032 Chronic diastolic (congestive) heart failure: Secondary | ICD-10-CM | POA: Diagnosis not present

## 2021-08-30 DIAGNOSIS — I872 Venous insufficiency (chronic) (peripheral): Secondary | ICD-10-CM | POA: Diagnosis not present

## 2021-08-30 DIAGNOSIS — M5136 Other intervertebral disc degeneration, lumbar region: Secondary | ICD-10-CM | POA: Diagnosis not present

## 2021-08-30 DIAGNOSIS — I110 Hypertensive heart disease with heart failure: Secondary | ICD-10-CM | POA: Diagnosis not present

## 2021-08-30 DIAGNOSIS — E785 Hyperlipidemia, unspecified: Secondary | ICD-10-CM | POA: Diagnosis not present

## 2021-08-30 DIAGNOSIS — I700 Atherosclerosis of aorta: Secondary | ICD-10-CM | POA: Diagnosis not present

## 2021-08-30 DIAGNOSIS — I2699 Other pulmonary embolism without acute cor pulmonale: Secondary | ICD-10-CM | POA: Diagnosis not present

## 2021-08-30 DIAGNOSIS — I82441 Acute embolism and thrombosis of right tibial vein: Secondary | ICD-10-CM | POA: Diagnosis not present

## 2021-08-30 DIAGNOSIS — J9621 Acute and chronic respiratory failure with hypoxia: Secondary | ICD-10-CM | POA: Diagnosis not present

## 2021-08-30 DIAGNOSIS — I341 Nonrheumatic mitral (valve) prolapse: Secondary | ICD-10-CM | POA: Diagnosis not present

## 2021-08-30 DIAGNOSIS — D696 Thrombocytopenia, unspecified: Secondary | ICD-10-CM | POA: Diagnosis not present

## 2021-08-30 DIAGNOSIS — I739 Peripheral vascular disease, unspecified: Secondary | ICD-10-CM | POA: Diagnosis not present

## 2021-08-30 DIAGNOSIS — D649 Anemia, unspecified: Secondary | ICD-10-CM | POA: Diagnosis not present

## 2021-08-30 DIAGNOSIS — M1990 Unspecified osteoarthritis, unspecified site: Secondary | ICD-10-CM | POA: Diagnosis not present

## 2021-08-30 DIAGNOSIS — F419 Anxiety disorder, unspecified: Secondary | ICD-10-CM | POA: Diagnosis not present

## 2021-08-30 DIAGNOSIS — K5700 Diverticulitis of small intestine with perforation and abscess without bleeding: Secondary | ICD-10-CM | POA: Diagnosis not present

## 2021-08-30 DIAGNOSIS — I2510 Atherosclerotic heart disease of native coronary artery without angina pectoris: Secondary | ICD-10-CM | POA: Diagnosis not present

## 2021-08-30 DIAGNOSIS — R4702 Dysphasia: Secondary | ICD-10-CM | POA: Diagnosis not present

## 2021-08-30 DIAGNOSIS — I6529 Occlusion and stenosis of unspecified carotid artery: Secondary | ICD-10-CM | POA: Diagnosis not present

## 2021-08-30 DIAGNOSIS — E538 Deficiency of other specified B group vitamins: Secondary | ICD-10-CM | POA: Diagnosis not present

## 2021-08-30 DIAGNOSIS — G4730 Sleep apnea, unspecified: Secondary | ICD-10-CM | POA: Diagnosis not present

## 2021-08-31 DIAGNOSIS — I2510 Atherosclerotic heart disease of native coronary artery without angina pectoris: Secondary | ICD-10-CM | POA: Diagnosis not present

## 2021-08-31 DIAGNOSIS — K5700 Diverticulitis of small intestine with perforation and abscess without bleeding: Secondary | ICD-10-CM | POA: Diagnosis not present

## 2021-08-31 DIAGNOSIS — I110 Hypertensive heart disease with heart failure: Secondary | ICD-10-CM | POA: Diagnosis not present

## 2021-08-31 DIAGNOSIS — I82441 Acute embolism and thrombosis of right tibial vein: Secondary | ICD-10-CM | POA: Diagnosis not present

## 2021-08-31 DIAGNOSIS — N179 Acute kidney failure, unspecified: Secondary | ICD-10-CM | POA: Diagnosis not present

## 2021-08-31 DIAGNOSIS — J9621 Acute and chronic respiratory failure with hypoxia: Secondary | ICD-10-CM | POA: Diagnosis not present

## 2021-08-31 DIAGNOSIS — I2699 Other pulmonary embolism without acute cor pulmonale: Secondary | ICD-10-CM | POA: Diagnosis not present

## 2021-08-31 DIAGNOSIS — I5032 Chronic diastolic (congestive) heart failure: Secondary | ICD-10-CM | POA: Diagnosis not present

## 2021-09-01 DIAGNOSIS — M1990 Unspecified osteoarthritis, unspecified site: Secondary | ICD-10-CM | POA: Diagnosis not present

## 2021-09-01 DIAGNOSIS — I872 Venous insufficiency (chronic) (peripheral): Secondary | ICD-10-CM | POA: Diagnosis not present

## 2021-09-01 DIAGNOSIS — I341 Nonrheumatic mitral (valve) prolapse: Secondary | ICD-10-CM | POA: Diagnosis not present

## 2021-09-01 DIAGNOSIS — I739 Peripheral vascular disease, unspecified: Secondary | ICD-10-CM | POA: Diagnosis not present

## 2021-09-01 DIAGNOSIS — D649 Anemia, unspecified: Secondary | ICD-10-CM | POA: Diagnosis not present

## 2021-09-01 DIAGNOSIS — I2699 Other pulmonary embolism without acute cor pulmonale: Secondary | ICD-10-CM | POA: Diagnosis not present

## 2021-09-01 DIAGNOSIS — E785 Hyperlipidemia, unspecified: Secondary | ICD-10-CM | POA: Diagnosis not present

## 2021-09-01 DIAGNOSIS — R4702 Dysphasia: Secondary | ICD-10-CM | POA: Diagnosis not present

## 2021-09-01 DIAGNOSIS — E041 Nontoxic single thyroid nodule: Secondary | ICD-10-CM | POA: Diagnosis not present

## 2021-09-01 DIAGNOSIS — I5032 Chronic diastolic (congestive) heart failure: Secondary | ICD-10-CM | POA: Diagnosis not present

## 2021-09-01 DIAGNOSIS — J9621 Acute and chronic respiratory failure with hypoxia: Secondary | ICD-10-CM | POA: Diagnosis not present

## 2021-09-01 DIAGNOSIS — I700 Atherosclerosis of aorta: Secondary | ICD-10-CM | POA: Diagnosis not present

## 2021-09-01 DIAGNOSIS — G629 Polyneuropathy, unspecified: Secondary | ICD-10-CM | POA: Diagnosis not present

## 2021-09-01 DIAGNOSIS — G4730 Sleep apnea, unspecified: Secondary | ICD-10-CM | POA: Diagnosis not present

## 2021-09-01 DIAGNOSIS — I6529 Occlusion and stenosis of unspecified carotid artery: Secondary | ICD-10-CM | POA: Diagnosis not present

## 2021-09-01 DIAGNOSIS — M5136 Other intervertebral disc degeneration, lumbar region: Secondary | ICD-10-CM | POA: Diagnosis not present

## 2021-09-01 DIAGNOSIS — F419 Anxiety disorder, unspecified: Secondary | ICD-10-CM | POA: Diagnosis not present

## 2021-09-01 DIAGNOSIS — N179 Acute kidney failure, unspecified: Secondary | ICD-10-CM | POA: Diagnosis not present

## 2021-09-01 DIAGNOSIS — I2510 Atherosclerotic heart disease of native coronary artery without angina pectoris: Secondary | ICD-10-CM | POA: Diagnosis not present

## 2021-09-01 DIAGNOSIS — D696 Thrombocytopenia, unspecified: Secondary | ICD-10-CM | POA: Diagnosis not present

## 2021-09-01 DIAGNOSIS — K5700 Diverticulitis of small intestine with perforation and abscess without bleeding: Secondary | ICD-10-CM | POA: Diagnosis not present

## 2021-09-01 DIAGNOSIS — I110 Hypertensive heart disease with heart failure: Secondary | ICD-10-CM | POA: Diagnosis not present

## 2021-09-01 DIAGNOSIS — I82441 Acute embolism and thrombosis of right tibial vein: Secondary | ICD-10-CM | POA: Diagnosis not present

## 2021-09-01 DIAGNOSIS — E538 Deficiency of other specified B group vitamins: Secondary | ICD-10-CM | POA: Diagnosis not present

## 2021-09-03 DIAGNOSIS — M5136 Other intervertebral disc degeneration, lumbar region: Secondary | ICD-10-CM | POA: Diagnosis not present

## 2021-09-03 DIAGNOSIS — N179 Acute kidney failure, unspecified: Secondary | ICD-10-CM | POA: Diagnosis not present

## 2021-09-03 DIAGNOSIS — E538 Deficiency of other specified B group vitamins: Secondary | ICD-10-CM | POA: Diagnosis not present

## 2021-09-03 DIAGNOSIS — M1990 Unspecified osteoarthritis, unspecified site: Secondary | ICD-10-CM | POA: Diagnosis not present

## 2021-09-03 DIAGNOSIS — D696 Thrombocytopenia, unspecified: Secondary | ICD-10-CM | POA: Diagnosis not present

## 2021-09-03 DIAGNOSIS — R4702 Dysphasia: Secondary | ICD-10-CM | POA: Diagnosis not present

## 2021-09-03 DIAGNOSIS — I110 Hypertensive heart disease with heart failure: Secondary | ICD-10-CM | POA: Diagnosis not present

## 2021-09-03 DIAGNOSIS — I6529 Occlusion and stenosis of unspecified carotid artery: Secondary | ICD-10-CM | POA: Diagnosis not present

## 2021-09-03 DIAGNOSIS — F419 Anxiety disorder, unspecified: Secondary | ICD-10-CM | POA: Diagnosis not present

## 2021-09-03 DIAGNOSIS — D649 Anemia, unspecified: Secondary | ICD-10-CM | POA: Diagnosis not present

## 2021-09-03 DIAGNOSIS — I2510 Atherosclerotic heart disease of native coronary artery without angina pectoris: Secondary | ICD-10-CM | POA: Diagnosis not present

## 2021-09-03 DIAGNOSIS — I82441 Acute embolism and thrombosis of right tibial vein: Secondary | ICD-10-CM | POA: Diagnosis not present

## 2021-09-03 DIAGNOSIS — I2699 Other pulmonary embolism without acute cor pulmonale: Secondary | ICD-10-CM | POA: Diagnosis not present

## 2021-09-03 DIAGNOSIS — E785 Hyperlipidemia, unspecified: Secondary | ICD-10-CM | POA: Diagnosis not present

## 2021-09-03 DIAGNOSIS — I341 Nonrheumatic mitral (valve) prolapse: Secondary | ICD-10-CM | POA: Diagnosis not present

## 2021-09-03 DIAGNOSIS — E041 Nontoxic single thyroid nodule: Secondary | ICD-10-CM | POA: Diagnosis not present

## 2021-09-03 DIAGNOSIS — I739 Peripheral vascular disease, unspecified: Secondary | ICD-10-CM | POA: Diagnosis not present

## 2021-09-03 DIAGNOSIS — I5032 Chronic diastolic (congestive) heart failure: Secondary | ICD-10-CM | POA: Diagnosis not present

## 2021-09-03 DIAGNOSIS — K5700 Diverticulitis of small intestine with perforation and abscess without bleeding: Secondary | ICD-10-CM | POA: Diagnosis not present

## 2021-09-03 DIAGNOSIS — I700 Atherosclerosis of aorta: Secondary | ICD-10-CM | POA: Diagnosis not present

## 2021-09-03 DIAGNOSIS — I872 Venous insufficiency (chronic) (peripheral): Secondary | ICD-10-CM | POA: Diagnosis not present

## 2021-09-03 DIAGNOSIS — G4730 Sleep apnea, unspecified: Secondary | ICD-10-CM | POA: Diagnosis not present

## 2021-09-03 DIAGNOSIS — J9621 Acute and chronic respiratory failure with hypoxia: Secondary | ICD-10-CM | POA: Diagnosis not present

## 2021-09-03 DIAGNOSIS — G629 Polyneuropathy, unspecified: Secondary | ICD-10-CM | POA: Diagnosis not present

## 2021-09-06 ENCOUNTER — Ambulatory Visit (INDEPENDENT_AMBULATORY_CARE_PROVIDER_SITE_OTHER)
Admission: RE | Admit: 2021-09-06 | Discharge: 2021-09-06 | Disposition: A | Discharge status: Home / Self Care | Payer: PPO | Source: Ambulatory Visit | Attending: Pulmonary Disease | Admitting: Pulmonary Disease

## 2021-09-06 ENCOUNTER — Other Ambulatory Visit: Payer: Self-pay

## 2021-09-06 DIAGNOSIS — I2692 Saddle embolus of pulmonary artery without acute cor pulmonale: Secondary | ICD-10-CM | POA: Diagnosis not present

## 2021-09-06 DIAGNOSIS — J181 Lobar pneumonia, unspecified organism: Secondary | ICD-10-CM | POA: Diagnosis not present

## 2021-09-06 DIAGNOSIS — J439 Emphysema, unspecified: Secondary | ICD-10-CM | POA: Diagnosis not present

## 2021-09-07 DIAGNOSIS — E041 Nontoxic single thyroid nodule: Secondary | ICD-10-CM | POA: Diagnosis not present

## 2021-09-07 DIAGNOSIS — R4702 Dysphasia: Secondary | ICD-10-CM | POA: Diagnosis not present

## 2021-09-07 DIAGNOSIS — F419 Anxiety disorder, unspecified: Secondary | ICD-10-CM | POA: Diagnosis not present

## 2021-09-07 DIAGNOSIS — I2699 Other pulmonary embolism without acute cor pulmonale: Secondary | ICD-10-CM | POA: Diagnosis not present

## 2021-09-07 DIAGNOSIS — I341 Nonrheumatic mitral (valve) prolapse: Secondary | ICD-10-CM | POA: Diagnosis not present

## 2021-09-07 DIAGNOSIS — I5032 Chronic diastolic (congestive) heart failure: Secondary | ICD-10-CM | POA: Diagnosis not present

## 2021-09-07 DIAGNOSIS — I110 Hypertensive heart disease with heart failure: Secondary | ICD-10-CM | POA: Diagnosis not present

## 2021-09-07 DIAGNOSIS — D649 Anemia, unspecified: Secondary | ICD-10-CM | POA: Diagnosis not present

## 2021-09-07 DIAGNOSIS — J9621 Acute and chronic respiratory failure with hypoxia: Secondary | ICD-10-CM | POA: Diagnosis not present

## 2021-09-07 DIAGNOSIS — I2510 Atherosclerotic heart disease of native coronary artery without angina pectoris: Secondary | ICD-10-CM | POA: Diagnosis not present

## 2021-09-07 DIAGNOSIS — D696 Thrombocytopenia, unspecified: Secondary | ICD-10-CM | POA: Diagnosis not present

## 2021-09-07 DIAGNOSIS — I739 Peripheral vascular disease, unspecified: Secondary | ICD-10-CM | POA: Diagnosis not present

## 2021-09-07 DIAGNOSIS — M5136 Other intervertebral disc degeneration, lumbar region: Secondary | ICD-10-CM | POA: Diagnosis not present

## 2021-09-07 DIAGNOSIS — G4730 Sleep apnea, unspecified: Secondary | ICD-10-CM | POA: Diagnosis not present

## 2021-09-07 DIAGNOSIS — I872 Venous insufficiency (chronic) (peripheral): Secondary | ICD-10-CM | POA: Diagnosis not present

## 2021-09-07 DIAGNOSIS — N179 Acute kidney failure, unspecified: Secondary | ICD-10-CM | POA: Diagnosis not present

## 2021-09-07 DIAGNOSIS — E538 Deficiency of other specified B group vitamins: Secondary | ICD-10-CM | POA: Diagnosis not present

## 2021-09-07 DIAGNOSIS — G629 Polyneuropathy, unspecified: Secondary | ICD-10-CM | POA: Diagnosis not present

## 2021-09-07 DIAGNOSIS — K5700 Diverticulitis of small intestine with perforation and abscess without bleeding: Secondary | ICD-10-CM | POA: Diagnosis not present

## 2021-09-07 DIAGNOSIS — M1990 Unspecified osteoarthritis, unspecified site: Secondary | ICD-10-CM | POA: Diagnosis not present

## 2021-09-07 DIAGNOSIS — I6529 Occlusion and stenosis of unspecified carotid artery: Secondary | ICD-10-CM | POA: Diagnosis not present

## 2021-09-07 DIAGNOSIS — I700 Atherosclerosis of aorta: Secondary | ICD-10-CM | POA: Diagnosis not present

## 2021-09-07 DIAGNOSIS — I82441 Acute embolism and thrombosis of right tibial vein: Secondary | ICD-10-CM | POA: Diagnosis not present

## 2021-09-07 DIAGNOSIS — E785 Hyperlipidemia, unspecified: Secondary | ICD-10-CM | POA: Diagnosis not present

## 2021-09-08 ENCOUNTER — Ambulatory Visit: Payer: PPO | Admitting: Pulmonary Disease

## 2021-09-08 ENCOUNTER — Encounter: Payer: Self-pay | Admitting: Pulmonary Disease

## 2021-09-08 VITALS — BP 116/70 | HR 82 | Ht 67.0 in | Wt 208.4 lb

## 2021-09-08 DIAGNOSIS — G4733 Obstructive sleep apnea (adult) (pediatric): Secondary | ICD-10-CM

## 2021-09-08 DIAGNOSIS — I2692 Saddle embolus of pulmonary artery without acute cor pulmonale: Secondary | ICD-10-CM | POA: Diagnosis not present

## 2021-09-08 MED ORDER — APIXABAN 5 MG PO TABS: 5.0000 mg | ORAL_TABLET | Freq: Two times a day (BID) | ORAL | 1 refills | Status: DC

## 2021-09-08 NOTE — Progress Notes (Signed)
? ? ?Subjective:  ? ?PATIENT ID: Stephanie Alvarado GENDER: female DOB: Feb 02, 1941, MRN: [id number] ? ? ?HPI ? ?Chief Complaint  ?Patient presents with  ? Follow-up  ?  CT results ?Fell and broke rib off a brick porch  ? ? ?Reason for Visit: Follow-up ? ?[name] is a 81 year old female with OSA, submassive pulmonary embolism who presents for follow-up.   ? ?Initial consult/Feb 2023 ?She was recently admitted for diverticulitis.  She was also found with acute bilateral submassive pulmonary embolism and right lower extremity DVT.  Discharge summary for hospitalization from 1/24 to 07/19/2021 was reviewed. She was sent home with oxygen. Since discharge, she has not been ambulatory due to weakness. She is starting to work with Physical Therapy. Before the hospitalization she was previously had balance issues but able to perform ADLs and now she is struggling with sitting up without help. Planning for PT sessions twice a week. She is wearing her oxygen during her sessions. She is compliant with her CPAP nightly.  ? ?09/08/21 ?Since our last visit she has fallen. Reports long standing balance issues. Currently working with PT at home but considering going to a facility based on their recommendations. Occasionally uses oxygen with activity including with therapy. She is compliant with her anticoagulation and reports some bruising with her falls but no other bleeding issues. Denies shortness of breath, chest pain or cough. She is using her CPAP intermittently.  ? ?Social History: ?Quit smoking in 2000. 40 pack-years. ? ?Past Medical History:  ?Diagnosis Date  ? Achilles tendon rupture, right, initial encounter   ? Allergic rhinitis, cause unspecified   ? Anemia, unspecified   ? Anxiety state, unspecified   ? Atherosclerosis of aorta ([place])   ? TEE, June, 2010, grade 4 aortic arch atherosclerosis , Dr. Aundra Dubin  ? CAD (coronary artery disease)   ? DES and circumflex 2006 /  DES to LAD 2011  ? Carotid artery disease ([place])   ?  Doppler, November, 2011, stable, 0-39% bilateral, turbulent flow left subclavian  ? Chronic diastolic heart failure ([place])   ? Cyst of thyroid   ? Diverticulosis of colon (without mention of hemorrhage)   ? Dysphasia   ? Possibly esophageal stricture  ? Esophageal reflux   ? Hiatal hernia   ? Hyperlipemia   ? Hypertension   ? Lumbago   ? Lumbar disc disease   ? MVP (mitral valve prolapse)   ? Neuropathy   ? Nonspecific abnormal finding in stool contents   ? Obesity, unspecified   ? Osteoarthrosis, unspecified whether generalized or localized, unspecified site   ? Osteopenia   ? Other diseases of lung, not elsewhere classified   ? Peripheral vascular disease, unspecified ([place])   ? Personal history of colonic polyps   ? ADENOMATOUS POLYP  ? PFO (patent foramen ovale)   ? Patient had a very small PFO by TEE 2010.  This was seen only by bubble analysis during Valsalva  ? PONV (postoperative nausea and vomiting)   ? Sleep apnea   ? uses CPAP nightly  ? Sleep related hypoventilation/hypoxemia in conditions classifiable elsewhere   ? Stroke [facility]) 11/2008  ? Treated with TPA? /     2010, TEE no left atrial clot, probably from atherosclerosis of the aortic arch  ? Subclavian artery disease ([place])   ? Remote surgery by Dr. Victorino Dike.  ? Unspecified cerebral artery occlusion with cerebral infarction   ? Unspecified venous (peripheral) insufficiency   ? Vitamin B  12 deficiency   ?  ? ?Family History  ?Problem Relation Age of Onset  ? Heart disease Father   ? Heart attack Father   ? Alzheimer's disease Mother   ? Colon cancer Paternal Aunt   ? Breast cancer Other   ?     cousions  ? Esophageal cancer Neg Hx   ? Rectal cancer Neg Hx   ? Stomach cancer Neg Hx   ?  ? ?Social History  ? ?Occupational History  ? Occupation: Retired  ?  Employer: RETIRED  ?Tobacco Use  ? Smoking status: Former  ?  Packs/day: 1.00  ?  Years: 40.00  ?  Pack years: 40.00  ?  Types: Cigarettes  ?  Quit date: 06/13/1998  ?  Years since quitting: 23.2  ?  Smokeless tobacco: Never  ?Vaping Use  ? Vaping Use: Never used  ?Substance and Sexual Activity  ? Alcohol use: No  ?  Alcohol/week: 0.0 standard drinks  ? Drug use: No  ? Sexual activity: Not on file  ? ? ?Allergies  ?Allergen Reactions  ? Codeine Itching  ?  REACTION: itch  ?  ? ?Outpatient Medications Prior to Visit  ?Medication Sig Dispense Refill  ? amLODipine (NORVASC) 5 MG tablet Take 5 mg by mouth every morning.    ? apixaban (ELIQUIS) 5 MG TABS tablet Take 1 tablet (5 mg total) by mouth 2 (two) times daily. 60 tablet 1  ? buPROPion (WELLBUTRIN XL) 150 MG 24 hr tablet Take 300 mg by mouth daily.    ? furosemide (LASIX) 40 MG tablet Take 40 mg by mouth daily as needed for edema.    ? gabapentin (NEURONTIN) 300 MG capsule Take 600 mg by mouth 3 (three) times daily.     ? isosorbide mononitrate (IMDUR) 30 MG 24 hr tablet Take 0.5 tablets (15 mg total) by mouth at bedtime. 30 tablet 1  ? loratadine (CLARITIN) 10 MG tablet Take 1 tablet (10 mg total) by mouth daily. 30 tablet 1  ? metoprolol tartrate (LOPRESSOR) 25 MG tablet Take 0.5 tablets (12.5 mg total) by mouth 2 (two) times daily. 30 tablet 2  ? Multiple Vitamin (MULTIVITAMIN) capsule Take 1 capsule by mouth daily.    ? mupirocin ointment (BACTROBAN) 2 % Apply 1 application topically daily.    ? pantoprazole (PROTONIX) 40 MG tablet Take 40 mg by mouth 2 (two) times daily.    ? polyethylene glycol powder (GLYCOLAX/MIRALAX) powder Take 17 g by mouth daily as needed for moderate constipation.     ? rosuvastatin (CRESTOR) 10 MG tablet Take 10 mg by mouth at bedtime.    ? tiZANidine (ZANAFLEX) 4 MG tablet Take by mouth.    ? albuterol (VENTOLIN HFA) 108 (90 Base) MCG/ACT inhaler Inhale 2 puffs into the lungs in the morning, at noon, and at bedtime for 4 days, THEN 2 puffs every 6 (six) hours as needed for up to 4 days for wheezing or shortness of breath. 8 g 2  ? apixaban (ELIQUIS) 5 MG TABS tablet Take 2 tablets (10 mg total) by mouth 2 (two) times daily for 4  days, THEN 1 tablet (5 mg total) 2 (two) times daily. 60 tablet 1  ? bisacodyl (DULCOLAX) 10 MG suppository Place 1 suppository (10 mg total) rectally daily as needed for moderate constipation. (Patient not taking: Reported on 09/08/2021) 12 suppository 0  ? budesonide-formoterol (SYMBICORT) 160-4.5 MCG/ACT inhaler Inhale 2 puffs into the lungs 2 (two) times daily. (Patient not taking:  Reported on 09/08/2021) 6 g 2  ? cyclobenzaprine (FLEXERIL) 10 MG tablet cyclobenzaprine 10 mg tablet (Patient not taking: Reported on 09/08/2021)    ? cycloSPORINE (RESTASIS) 0.05 % ophthalmic emulsion SMARTSIG:In Eye(s) (Patient not taking: Reported on 09/08/2021)    ? docusate sodium (COLACE) 100 MG capsule Take 100 mg by mouth daily as needed for mild constipation. (Patient not taking: Reported on 09/08/2021)    ? fluticasone (FLONASE) 50 MCG/ACT nasal spray Place 2 sprays into both nostrils daily. (Patient not taking: Reported on 09/08/2021) 9.9 mL 1  ? latanoprost (XALATAN) 0.005 % ophthalmic solution SMARTSIG:In Eye(s) (Patient not taking: Reported on 09/08/2021)    ? linaclotide (LINZESS) 145 MCG CAPS capsule Take 1 capsule (145 mcg total) by mouth daily before breakfast. (Patient not taking: Reported on 09/08/2021) 90 capsule 3  ? nitroGLYCERIN (NITROSTAT) 0.4 MG SL tablet Place 0.4 mg under the tongue every 5 (five) minutes as needed for chest pain (TAKE UP TO 3 DOSES BEFORE CALLING 911). (Patient not taking: Reported on 09/08/2021)    ? PFIZER COVID-19 VAC BIVALENT injection  (Patient not taking: Reported on 09/08/2021)    ? polyvinyl alcohol (LIQUIFILM TEARS) 1.4 % ophthalmic solution Place 1 drop into both eyes as needed for dry eyes. (Patient not taking: Reported on 09/08/2021)    ? predniSONE (DELTASONE) 5 MG tablet Take 10 mg by mouth 2 (two) times daily. (Patient not taking: Reported on 09/08/2021)    ? tiotropium (SPIRIVA HANDIHALER) 18 MCG inhalation capsule Place 1 capsule (18 mcg total) into inhaler and inhale daily. (Patient  not taking: Reported on 09/08/2021) 30 capsule 2  ? traMADol (ULTRAM) 50 MG tablet tramadol 50 mg tablet ? TAKE 1 TO 2 TABLETS BY MOUTH TWICE DAILY AS NEEDED FOR PAIN (Patient not taking: Reported on 09/08/2021

## 2021-09-08 NOTE — Patient Instructions (Addendum)
Acute bilateral submassive pulmonary embolism ?Right lower extremity DVT ?Small right pleural effusion - resolved ?Patient is fall risk so recommended minimum 3 months of therapy. However she also prefers longer duration to continue DVT treatment/prevention. She expressed understanding regarding bleed risk. She reports has been improving with physical therapy at home.  ?--CONTINUE Eliquis for 3-6 months ? ?Acute on chronic hypoxemic respiratory failure - improved ?Intermittent hypoxemia with activity ?--Continue supplemental oxygen goal >88% ? ?Nocturnal hypoxemia 2/2 OSA ?OSA ?--Counseled on sleep hygiene ?--Counseled on weight loss/maintenance of healthy weight. Consider Inspire device if we we can achieve BMI <32 ?--Counseled NOT to drive if/when sleepy ?--Advised patient to wear CPAP for at least 4 hours each night for greater than 70% of the time to avoid the machine being repossessed by insurance. ?--Will need a sleep study in the future if she needs a new machine ? ?Follow-up with me in 2 months ?

## 2021-09-09 ENCOUNTER — Ambulatory Visit: Payer: PPO | Admitting: Cardiovascular Disease

## 2021-09-09 ENCOUNTER — Encounter: Payer: Self-pay | Admitting: Cardiovascular Disease

## 2021-09-09 VITALS — BP 128/72 | HR 79 | Ht 67.5 in | Wt 209.4 lb

## 2021-09-09 DIAGNOSIS — J449 Chronic obstructive pulmonary disease, unspecified: Secondary | ICD-10-CM | POA: Diagnosis not present

## 2021-09-09 DIAGNOSIS — D692 Other nonthrombocytopenic purpura: Secondary | ICD-10-CM | POA: Diagnosis not present

## 2021-09-09 DIAGNOSIS — I2510 Atherosclerotic heart disease of native coronary artery without angina pectoris: Secondary | ICD-10-CM | POA: Diagnosis not present

## 2021-09-09 DIAGNOSIS — R0602 Shortness of breath: Secondary | ICD-10-CM

## 2021-09-09 DIAGNOSIS — Z87891 Personal history of nicotine dependence: Secondary | ICD-10-CM | POA: Diagnosis not present

## 2021-09-09 DIAGNOSIS — I5032 Chronic diastolic (congestive) heart failure: Secondary | ICD-10-CM

## 2021-09-09 DIAGNOSIS — E782 Mixed hyperlipidemia: Secondary | ICD-10-CM | POA: Diagnosis not present

## 2021-09-09 DIAGNOSIS — Z9981 Dependence on supplemental oxygen: Secondary | ICD-10-CM | POA: Diagnosis not present

## 2021-09-09 MED ORDER — METOPROLOL SUCCINATE ER 25 MG PO TB24: 25.0000 mg | ORAL_TABLET | Freq: Two times a day (BID) | ORAL | 3 refills | Status: DC

## 2021-09-09 MED ORDER — FUROSEMIDE 40 MG PO TABS: 40.0000 mg | ORAL_TABLET | Freq: Every day | ORAL | 3 refills | Status: DC

## 2021-09-09 MED ORDER — POTASSIUM CHLORIDE ER 10 MEQ PO TBCR: 10.0000 meq | EXTENDED_RELEASE_TABLET | Freq: Every day | ORAL | 3 refills | Status: DC

## 2021-09-09 NOTE — Progress Notes (Signed)
?Cardiology Office Note:   ? ?Date:  09/09/2021  ? ?ID:  Stephanie Alvarado, DOB 1940/08/24, MRN [id number] ? ?PCP:  Caryl Bis, MD ?  ?[facility] Providers ?Cardiologist:  Sherren Mocha, MD    ? ?Referring MD: Caryl Bis, MD  ? ?Chief Complaint  ?Patient presents with  ? Coronary Artery Disease  ? ? ?History of Present Illness:   ? ?Stephanie Alvarado is a 81 y.o. female with a hx of  coronary artery disease, presenting for follow-up evaluation.  The patient CAD dates back to 2006 when she underwent PCI of the right coronary artery with a Zomax study stent.  In 2009 she developed recurrent symptoms and was found to have progressive stenosis in the proximal LAD, treated with a Promus drug-eluting stent at that time.  A nuclear stress scan in 2017 showed no significant ischemia.  Her last echocardiogram demonstrated normal LV systolic function with mild diastolic dysfunction.  She was hospitalized in January of this year with diverticulitis.  She had a complicated hospital stay where she developed submassive pulmonary emboli and she is now taking apixaban for anticoagulation.  She had a fall after her hospitalization and had a rib injury.  She was just seen by Dr. Loanne Drilling in the pulmonary clinic yesterday.  Plans are to continue her apixaban for about 6 months with the need to balance her bleeding risk and antithrombotic therapy. ? ?The patient is here alone today.  She is doing pretty well.  She continues to have exertional dyspnea which is a longstanding complaint.  She has been using oxygen at times.  She does not have orthopnea or PND.  She has had more problems with leg swelling of late, especially on the left side.  No chest pain or pressure.  No other complaints today ? ?Past Medical History:  ?Diagnosis Date  ? Achilles tendon rupture, right, initial encounter   ? Allergic rhinitis, cause unspecified   ? Anemia, unspecified   ? Anxiety state, unspecified   ? Atherosclerosis of aorta ([place])   ? TEE, June,  2010, grade 4 aortic arch atherosclerosis , Dr. Aundra Dubin  ? CAD (coronary artery disease)   ? DES and circumflex 2006 /  DES to LAD 2011  ? Carotid artery disease ([place])   ? Doppler, November, 2011, stable, 0-39% bilateral, turbulent flow left subclavian  ? Chronic diastolic heart failure ([place])   ? Cyst of thyroid   ? Diverticulosis of colon (without mention of hemorrhage)   ? Dysphasia   ? Possibly esophageal stricture  ? Esophageal reflux   ? Hiatal hernia   ? Hyperlipemia   ? Hypertension   ? Lumbago   ? Lumbar disc disease   ? MVP (mitral valve prolapse)   ? Neuropathy   ? Nonspecific abnormal finding in stool contents   ? Obesity, unspecified   ? Osteoarthrosis, unspecified whether generalized or localized, unspecified site   ? Osteopenia   ? Other diseases of lung, not elsewhere classified   ? Peripheral vascular disease, unspecified ([place])   ? Personal history of colonic polyps   ? ADENOMATOUS POLYP  ? PFO (patent foramen ovale)   ? Patient had a very small PFO by TEE 2010.  This was seen only by bubble analysis during Valsalva  ? PONV (postoperative nausea and vomiting)   ? Sleep apnea   ? uses CPAP nightly  ? Sleep related hypoventilation/hypoxemia in conditions classifiable elsewhere   ? Stroke [facility]) 11/2008  ? Treated with  TPA? /     2010, TEE no left atrial clot, probably from atherosclerosis of the aortic arch  ? 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 Remote surgery by Dr. Victorino Dike.  ? Unspecified cerebral artery occlusion with cerebral infarction   ? Unspecified venous (peripheral) insufficiency   ? Vitamin B 12 deficiency   ? ? ?Past Surgical History:  ?Procedure Laterality Date  ? ACHILLES TENDON SURGERY Right 04/05/2018  ? Procedure: Right achilles tendon reconstruction;  Surgeon: Wylene Simmer, MD;  Location: [facility];  Service: Orthopedics;  Laterality: Right;  54mn  ? BACK SURGERY    ? cataracts    ? both eyes  ? CHOLECYSTECTOMY    ? CORONARY ANGIOPLASTY WITH STENT PLACEMENT  1990's  ?  GASTROCNEMIUS RECESSION Right 04/05/2018  ? Procedure: Gastroc recession;  Surgeon: HWylene Simmer MD;  [facility]  Service: Orthopedics;  Laterality: Right;  ? LAPAROSCOPIC HYSTERECTOMY    ? LUMBAR DISC SURGERY    ? X 3  ? RIGHT/LEFT HEART CATH AND CORONARY ANGIOGRAPHY N/A 04/10/2017  ? Procedure: RIGHT/LEFT HEART CATH AND CORONARY ANGIOGRAPHY;  Surgeon: SBelva Crome MD;  [facility];  Service: Cardiovascular;  Laterality: N/A;  ? TOTAL KNEE ARTHROPLASTY Right 01/14/2019  ? Procedure: TOTAL KNEE ARTHROPLASTY;  Surgeon: AGaynelle Arabian MD;  Location: [facility];  Service: Orthopedics;  Laterality: Right;  523m  ? TOTAL KNEE ARTHROPLASTY Left 12/23/2019  ? Procedure: TOTAL KNEE ARTHROPLASTY;  Surgeon: AlGaynelle ArabianMD;  Location: [facility];  Service: Orthopedics;  Laterality: Left;  5064m ? urethral suspension  1993  ? Dr. NeaNori Riis VARICOSE VEIN SURGERY    ? x 2  ? ? ?Current Medications: ?Current Meds  ?Medication Sig  ? apixaban (ELIQUIS) 5 MG TABS tablet Take 1 tablet (5 mg total) by mouth 2 (two) times daily.  ? buPROPion (WELLBUTRIN XL) 150 MG 24 hr tablet Take 300 mg by mouth daily.  ? fluticasone (FLONASE) 50 MCG/ACT nasal spray Place 2 sprays into both nostrils daily.  ? furosemide (LASIX) 40 MG tablet Take 1 tablet (40 mg total) by mouth daily.  ? gabapentin (NEURONTIN) 300 MG capsule Take 600 mg by mouth 3 (three) times daily.   ? isosorbide mononitrate (IMDUR) 30 MG 24 hr tablet Take 0.5 tablets (15 mg total) by mouth at bedtime.  ? latanoprost (XALATAN) 0.005 % ophthalmic solution   ? loratadine (CLARITIN) 10 MG tablet Take 1 tablet (10 mg total) by mouth daily.  ? metoprolol succinate (TOPROL XL) 25 MG 24 hr tablet Take 1 tablet (25 mg total) by mouth 2 (two) times daily.  ? Multiple Vitamin (MULTIVITAMIN) capsule Take 1 capsule by mouth daily.  ? nitroGLYCERIN (NITROSTAT) 0.4 MG SL tablet Place 0.4 mg under the tongue every 5 (five) minutes as needed for chest pain  (TAKE UP TO 3 DOSES BEFORE CALLING 911).  ? pantoprazole (PROTONIX) 40 MG tablet Take 40 mg by mouth 2 (two) times daily.  ? PFIZER COVID-19 VAC BIVALENT injection   ? polyethylene glycol powder (GLYCOLAX/MIRALAX) powder Take 17 g by mouth daily as needed for moderate constipation.   ? polyvinyl alcohol (LIQUIFILM TEARS) 1.4 % ophthalmic solution Place 1 drop into both eyes as needed for dry eyes.  ? potassium chloride (KLOR-CON) 10 MEQ tablet Take 1 tablet (10 mEq total) by mouth daily.  ? rosuvastatin (CRESTOR) 10 MG tablet Take 10 mg by mouth at bedtime.  ? tiZANidine (ZANAFLEX) 4  MG tablet Take by mouth.  ? [DISCONTINUED] amLODipine (NORVASC) 5 MG tablet Take 5 mg by mouth every morning.  ? [DISCONTINUED] furosemide (LASIX) 40 MG tablet Take 40 mg by mouth daily as needed for edema.  ? [DISCONTINUED] metoprolol tartrate (LOPRESSOR) 25 MG tablet Take 0.5 tablets (12.5 mg total) by mouth 2 (two) times daily.  ?  ? ?Allergies:   Codeine  ? ?Social History  ? ?Socioeconomic History  ? Marital status: Widowed  ?  Spouse name: Not on file  ? Number of children: 4  ? Years of education: Not on file  ? Highest education level: Not on file  ?Occupational History  ? Occupation: Retired  ?  Employer: RETIRED  ?Tobacco Use  ? Smoking status: Former  ?  Packs/day: 1.00  ?  Years: 40.00  ?  Pack years: 40.00  ?  Types: Cigarettes  ?  Quit date: 06/13/1998  ?  Years since quitting: 23.2  ? Smokeless tobacco: Never  ?Vaping Use  ? Vaping Use: Never used  ?Substance and Sexual Activity  ? Alcohol use: No  ?  Alcohol/week: 0.0 standard drinks  ? Drug use: No  ? Sexual activity: Not on file  ?Other Topics Concern  ? Not on file  ?Social History Narrative  ? Not on file  ? ?Social Determinants of Health  ? ?Financial Resource Strain: Not on file  ?Food Insecurity: Not on file  ?Transportation Needs: Not on file  ?Physical Activity: Not on file  ?Stress: Not on file  ?Social Connections: Not on file  ?  ? ?Family History: ?The  patient's family history includes Alzheimer's disease in her mother; Breast cancer in an other family member; Colon cancer in her paternal aunt; Heart attack in her father; Heart disease in her father. There

## 2021-09-09 NOTE — Patient Instructions (Signed)
Medication Instructions:  ?STOP AMLODIPINE ?INCREASE Metoprolol Succinate (Toprol XL) to '25mg'$  twice daily ?BEGIN Furosemide (Lasix) '40mg'$  daily (not as needed) ?START POTASSIUM 36mq daily ?*If you need a refill on your cardiac medications before your next appointment, please call your pharmacy* ? ? ?Lab Work: ?NONE ?If you have labs (blood work) drawn today and your tests are completely normal, you will receive your results only by: ?MyChart Message (if you have MyChart) OR ?A paper copy in the mail ?If you have any lab test that is abnormal or we need to change your treatment, we will call you to review the results. ? ? ?Testing/Procedures: ?NONE ? ? ?Follow-Up: ?[facility] and your health needs are our priority.  As part of our continuing mission to provide you with exceptional heart care, we have created designated Provider Care Teams.  These Care Teams include your primary Cardiologist (physician) and Advanced Practice Providers (APPs -  Physician Assistants and Nurse Practitioners) who all work together to provide you with the care you need, when you need it. ? ?Your next appointment:   ?1 year(s) ? ?The format for your next appointment:   ?In Person ? ?Provider:   ?MSherren Mocha MD   ? ?  ?

## 2021-09-10 DIAGNOSIS — I739 Peripheral vascular disease, unspecified: Secondary | ICD-10-CM | POA: Diagnosis not present

## 2021-09-10 DIAGNOSIS — K5700 Diverticulitis of small intestine with perforation and abscess without bleeding: Secondary | ICD-10-CM | POA: Diagnosis not present

## 2021-09-10 DIAGNOSIS — F419 Anxiety disorder, unspecified: Secondary | ICD-10-CM | POA: Diagnosis not present

## 2021-09-10 DIAGNOSIS — G4730 Sleep apnea, unspecified: Secondary | ICD-10-CM | POA: Diagnosis not present

## 2021-09-10 DIAGNOSIS — G629 Polyneuropathy, unspecified: Secondary | ICD-10-CM | POA: Diagnosis not present

## 2021-09-10 DIAGNOSIS — M1990 Unspecified osteoarthritis, unspecified site: Secondary | ICD-10-CM | POA: Diagnosis not present

## 2021-09-10 DIAGNOSIS — J9621 Acute and chronic respiratory failure with hypoxia: Secondary | ICD-10-CM | POA: Diagnosis not present

## 2021-09-10 DIAGNOSIS — I2510 Atherosclerotic heart disease of native coronary artery without angina pectoris: Secondary | ICD-10-CM | POA: Diagnosis not present

## 2021-09-10 DIAGNOSIS — I110 Hypertensive heart disease with heart failure: Secondary | ICD-10-CM | POA: Diagnosis not present

## 2021-09-10 DIAGNOSIS — I129 Hypertensive chronic kidney disease with stage 1 through stage 4 chronic kidney disease, or unspecified chronic kidney disease: Secondary | ICD-10-CM | POA: Diagnosis not present

## 2021-09-10 DIAGNOSIS — I872 Venous insufficiency (chronic) (peripheral): Secondary | ICD-10-CM | POA: Diagnosis not present

## 2021-09-10 DIAGNOSIS — E785 Hyperlipidemia, unspecified: Secondary | ICD-10-CM | POA: Diagnosis not present

## 2021-09-10 DIAGNOSIS — M5136 Other intervertebral disc degeneration, lumbar region: Secondary | ICD-10-CM | POA: Diagnosis not present

## 2021-09-10 DIAGNOSIS — I700 Atherosclerosis of aorta: Secondary | ICD-10-CM | POA: Diagnosis not present

## 2021-09-10 DIAGNOSIS — D696 Thrombocytopenia, unspecified: Secondary | ICD-10-CM | POA: Diagnosis not present

## 2021-09-10 DIAGNOSIS — E041 Nontoxic single thyroid nodule: Secondary | ICD-10-CM | POA: Diagnosis not present

## 2021-09-10 DIAGNOSIS — R4702 Dysphasia: Secondary | ICD-10-CM | POA: Diagnosis not present

## 2021-09-10 DIAGNOSIS — I6529 Occlusion and stenosis of unspecified carotid artery: Secondary | ICD-10-CM | POA: Diagnosis not present

## 2021-09-10 DIAGNOSIS — D649 Anemia, unspecified: Secondary | ICD-10-CM | POA: Diagnosis not present

## 2021-09-10 DIAGNOSIS — E538 Deficiency of other specified B group vitamins: Secondary | ICD-10-CM | POA: Diagnosis not present

## 2021-09-10 DIAGNOSIS — I5032 Chronic diastolic (congestive) heart failure: Secondary | ICD-10-CM | POA: Diagnosis not present

## 2021-09-10 DIAGNOSIS — I2699 Other pulmonary embolism without acute cor pulmonale: Secondary | ICD-10-CM | POA: Diagnosis not present

## 2021-09-10 DIAGNOSIS — I82441 Acute embolism and thrombosis of right tibial vein: Secondary | ICD-10-CM | POA: Diagnosis not present

## 2021-09-10 DIAGNOSIS — I341 Nonrheumatic mitral (valve) prolapse: Secondary | ICD-10-CM | POA: Diagnosis not present

## 2021-09-10 DIAGNOSIS — N179 Acute kidney failure, unspecified: Secondary | ICD-10-CM | POA: Diagnosis not present

## 2021-09-10 DIAGNOSIS — E7849 Other hyperlipidemia: Secondary | ICD-10-CM | POA: Diagnosis not present

## 2021-09-15 DIAGNOSIS — M1990 Unspecified osteoarthritis, unspecified site: Secondary | ICD-10-CM | POA: Diagnosis not present

## 2021-09-15 DIAGNOSIS — I739 Peripheral vascular disease, unspecified: Secondary | ICD-10-CM | POA: Diagnosis not present

## 2021-09-15 DIAGNOSIS — M5136 Other intervertebral disc degeneration, lumbar region: Secondary | ICD-10-CM | POA: Diagnosis not present

## 2021-09-15 DIAGNOSIS — I872 Venous insufficiency (chronic) (peripheral): Secondary | ICD-10-CM | POA: Diagnosis not present

## 2021-09-15 DIAGNOSIS — I110 Hypertensive heart disease with heart failure: Secondary | ICD-10-CM | POA: Diagnosis not present

## 2021-09-15 DIAGNOSIS — I82441 Acute embolism and thrombosis of right tibial vein: Secondary | ICD-10-CM | POA: Diagnosis not present

## 2021-09-15 DIAGNOSIS — J9621 Acute and chronic respiratory failure with hypoxia: Secondary | ICD-10-CM | POA: Diagnosis not present

## 2021-09-15 DIAGNOSIS — E041 Nontoxic single thyroid nodule: Secondary | ICD-10-CM | POA: Diagnosis not present

## 2021-09-15 DIAGNOSIS — I2699 Other pulmonary embolism without acute cor pulmonale: Secondary | ICD-10-CM | POA: Diagnosis not present

## 2021-09-15 DIAGNOSIS — I700 Atherosclerosis of aorta: Secondary | ICD-10-CM | POA: Diagnosis not present

## 2021-09-15 DIAGNOSIS — R4702 Dysphasia: Secondary | ICD-10-CM | POA: Diagnosis not present

## 2021-09-15 DIAGNOSIS — I2510 Atherosclerotic heart disease of native coronary artery without angina pectoris: Secondary | ICD-10-CM | POA: Diagnosis not present

## 2021-09-15 DIAGNOSIS — I6529 Occlusion and stenosis of unspecified carotid artery: Secondary | ICD-10-CM | POA: Diagnosis not present

## 2021-09-15 DIAGNOSIS — G629 Polyneuropathy, unspecified: Secondary | ICD-10-CM | POA: Diagnosis not present

## 2021-09-15 DIAGNOSIS — G4730 Sleep apnea, unspecified: Secondary | ICD-10-CM | POA: Diagnosis not present

## 2021-09-15 DIAGNOSIS — F419 Anxiety disorder, unspecified: Secondary | ICD-10-CM | POA: Diagnosis not present

## 2021-09-15 DIAGNOSIS — I5032 Chronic diastolic (congestive) heart failure: Secondary | ICD-10-CM | POA: Diagnosis not present

## 2021-09-15 DIAGNOSIS — E785 Hyperlipidemia, unspecified: Secondary | ICD-10-CM | POA: Diagnosis not present

## 2021-09-15 DIAGNOSIS — I341 Nonrheumatic mitral (valve) prolapse: Secondary | ICD-10-CM | POA: Diagnosis not present

## 2021-09-15 DIAGNOSIS — K5700 Diverticulitis of small intestine with perforation and abscess without bleeding: Secondary | ICD-10-CM | POA: Diagnosis not present

## 2021-09-15 DIAGNOSIS — D696 Thrombocytopenia, unspecified: Secondary | ICD-10-CM | POA: Diagnosis not present

## 2021-09-15 DIAGNOSIS — N179 Acute kidney failure, unspecified: Secondary | ICD-10-CM | POA: Diagnosis not present

## 2021-09-15 DIAGNOSIS — D649 Anemia, unspecified: Secondary | ICD-10-CM | POA: Diagnosis not present

## 2021-09-15 DIAGNOSIS — E538 Deficiency of other specified B group vitamins: Secondary | ICD-10-CM | POA: Diagnosis not present

## 2021-09-26 DIAGNOSIS — G4733 Obstructive sleep apnea (adult) (pediatric): Secondary | ICD-10-CM | POA: Diagnosis not present

## 2021-09-26 DIAGNOSIS — R0902 Hypoxemia: Secondary | ICD-10-CM | POA: Diagnosis not present

## 2021-10-10 DIAGNOSIS — N183 Chronic kidney disease, stage 3 unspecified: Secondary | ICD-10-CM | POA: Diagnosis not present

## 2021-10-10 DIAGNOSIS — E7849 Other hyperlipidemia: Secondary | ICD-10-CM | POA: Diagnosis not present

## 2021-10-10 DIAGNOSIS — J441 Chronic obstructive pulmonary disease with (acute) exacerbation: Secondary | ICD-10-CM | POA: Diagnosis not present

## 2021-10-10 DIAGNOSIS — I129 Hypertensive chronic kidney disease with stage 1 through stage 4 chronic kidney disease, or unspecified chronic kidney disease: Secondary | ICD-10-CM | POA: Diagnosis not present

## 2021-10-11 ENCOUNTER — Ambulatory Visit: Payer: PPO | Admitting: Pulmonary Disease

## 2021-10-12 DIAGNOSIS — M545 Low back pain, unspecified: Secondary | ICD-10-CM | POA: Diagnosis not present

## 2021-10-12 DIAGNOSIS — M791 Myalgia, unspecified site: Secondary | ICD-10-CM | POA: Diagnosis not present

## 2021-10-26 DIAGNOSIS — G4733 Obstructive sleep apnea (adult) (pediatric): Secondary | ICD-10-CM | POA: Diagnosis not present

## 2021-10-26 DIAGNOSIS — R0902 Hypoxemia: Secondary | ICD-10-CM | POA: Diagnosis not present

## 2021-10-27 DIAGNOSIS — G629 Polyneuropathy, unspecified: Secondary | ICD-10-CM | POA: Diagnosis not present

## 2021-10-27 DIAGNOSIS — Z79899 Other long term (current) drug therapy: Secondary | ICD-10-CM | POA: Diagnosis not present

## 2021-10-27 DIAGNOSIS — R531 Weakness: Secondary | ICD-10-CM | POA: Diagnosis not present

## 2021-10-27 DIAGNOSIS — I10 Essential (primary) hypertension: Secondary | ICD-10-CM | POA: Diagnosis not present

## 2021-10-27 DIAGNOSIS — M25512 Pain in left shoulder: Secondary | ICD-10-CM | POA: Diagnosis not present

## 2021-11-04 ENCOUNTER — Other Ambulatory Visit: Payer: Self-pay | Admitting: Pulmonary Disease

## 2021-11-05 NOTE — Telephone Encounter (Signed)
Patient needs appointment with Dr Loanne Drilling to determine if she is to stay on Eliquis

## 2021-11-10 DIAGNOSIS — E7849 Other hyperlipidemia: Secondary | ICD-10-CM | POA: Diagnosis not present

## 2021-11-10 DIAGNOSIS — N183 Chronic kidney disease, stage 3 unspecified: Secondary | ICD-10-CM | POA: Diagnosis not present

## 2021-11-10 DIAGNOSIS — J441 Chronic obstructive pulmonary disease with (acute) exacerbation: Secondary | ICD-10-CM | POA: Diagnosis not present

## 2021-11-10 DIAGNOSIS — I129 Hypertensive chronic kidney disease with stage 1 through stage 4 chronic kidney disease, or unspecified chronic kidney disease: Secondary | ICD-10-CM | POA: Diagnosis not present

## 2021-11-11 ENCOUNTER — Encounter: Payer: Self-pay | Admitting: Pulmonary Disease

## 2021-11-11 ENCOUNTER — Ambulatory Visit: Payer: PPO | Admitting: Pulmonary Disease

## 2021-11-11 VITALS — BP 130/72 | HR 64 | Ht 67.5 in | Wt 202.8 lb

## 2021-11-11 DIAGNOSIS — I2692 Saddle embolus of pulmonary artery without acute cor pulmonale: Secondary | ICD-10-CM

## 2021-11-11 DIAGNOSIS — G4734 Idiopathic sleep related nonobstructive alveolar hypoventilation: Secondary | ICD-10-CM | POA: Insufficient documentation

## 2021-11-11 MED ORDER — APIXABAN 5 MG PO TABS: 5.0000 mg | ORAL_TABLET | Freq: Two times a day (BID) | ORAL | 1 refills | Status: DC

## 2021-11-11 NOTE — Progress Notes (Signed)
Subjective:   PATIENT ID: Stephanie Alvarado GENDER: female DOB: 1940-12-03, MRN: [id number]   HPI  Chief Complaint  Patient presents with   Follow-up    Reason for Visit: Follow-up  [name] is a 81 year old female with OSA, submassive pulmonary embolism who presents for follow-up.    Initial consult/Feb 2023 She was recently admitted for diverticulitis.  She was also found with acute bilateral submassive pulmonary embolism and right lower extremity DVT.  Discharge summary for hospitalization from 1/24 to 07/19/2021 was reviewed. She was sent home with oxygen. Since discharge, she has not been ambulatory due to weakness. She is starting to work with Physical Therapy. Before the hospitalization she was previously had balance issues but able to perform ADLs and now she is struggling with sitting up without help. Planning for PT sessions twice a week. She is wearing her oxygen during her sessions. She is compliant with her CPAP nightly.   09/08/21 Since our last visit she has fallen. Reports long standing balance issues. Currently working with PT at home but considering going to a facility based on their recommendations. Occasionally uses oxygen with activity including with therapy. She is compliant with her anticoagulation and reports some bruising with her falls but no other bleeding issues. Denies shortness of breath, chest pain or cough. She is using her CPAP intermittently.   11/11/21 She has completed physical therapy at home. She remains unsteady with walking due to right hammertoes. Tolerating eliquis except for bruising. She is nervous about discontinuing her anticoagulation. She is intermittently wearing her CPAP but planning to be more consistent in the future. Denies shortness of breath, chest pain or cough.  Social History: Quit smoking in 2000. 40 pack-years.  Past Medical History:  Diagnosis Date   Achilles tendon rupture, right, initial encounter    Allergic rhinitis,  cause unspecified    Anemia, unspecified    Anxiety state, unspecified    Atherosclerosis of aorta ([place])    TEE, June, 2010, grade 4 aortic arch atherosclerosis , Dr. Aundra Dubin   CAD (coronary artery disease)    DES and circumflex 2006 /  DES to LAD 2011   Carotid artery disease ([place])    Doppler, November, 2011, stable, 0-39% bilateral, turbulent flow left subclavian   Chronic diastolic heart failure ([place])    Cyst of thyroid    Diverticulosis of colon (without mention of hemorrhage)    Dysphasia    Possibly esophageal stricture   Esophageal reflux    Hiatal hernia    Hyperlipemia    Hypertension    Lumbago    Lumbar disc disease    MVP (mitral valve prolapse)    Neuropathy    Nonspecific abnormal finding in stool contents    Obesity, unspecified    Osteoarthrosis, unspecified whether generalized or localized, unspecified site    Osteopenia    Other diseases of lung, not elsewhere classified    Peripheral vascular disease, unspecified ([place])    Personal history of colonic polyps    ADENOMATOUS POLYP   PFO (patent foramen ovale)    Patient had a very small PFO by TEE 2010.  This was seen only by bubble analysis during Valsalva   PONV (postoperative nausea and vomiting)    Sleep apnea    uses CPAP nightly   Sleep related hypoventilation/hypoxemia in conditions classifiable elsewhere    Stroke [facility]) 11/2008   Treated with TPA? 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 Victorino Dike.   Unspecified cerebral artery occlusion with cerebral infarction    Unspecified venous (peripheral) insufficiency    Vitamin B 12 deficiency      Family History  Problem Relation Age of Onset   Heart disease Father    Heart attack Father    Alzheimer's disease Mother    Colon cancer Paternal Aunt    Breast cancer Other        cousions   Esophageal cancer Neg Hx    Rectal cancer Neg Hx     Stomach cancer Neg Hx      Social History   Occupational History   Occupation: Retired    Fish farm manager: RETIRED  Tobacco Use   Smoking status: Former    Packs/day: 1.00    Years: 40.00    Pack years: 40.00    Types: Cigarettes    Quit date: 06/13/1998    Years since quitting: 23.4   Smokeless tobacco: Never  Vaping Use   Vaping Use: Never used  Substance and Sexual Activity   Alcohol use: No    Alcohol/week: 0.0 standard drinks   Drug use: No   Sexual activity: Not on file    Allergies  Allergen Reactions   Codeine Itching    REACTION: itch     Outpatient Medications Prior to Visit  Medication Sig Dispense Refill   apixaban (ELIQUIS) 5 MG TABS tablet Take 1 tablet (5 mg total) by mouth 2 (two) times daily. 60 tablet 1   buPROPion (WELLBUTRIN XL) 150 MG 24 hr tablet Take 300 mg by mouth daily.     cyclobenzaprine (FLEXERIL) 10 MG tablet      cycloSPORINE (RESTASIS) 0.05 % ophthalmic emulsion      docusate sodium (COLACE) 100 MG capsule Take 100 mg by mouth daily as needed for mild constipation.     fluticasone (FLONASE) 50 MCG/ACT nasal spray Place 2 sprays into both nostrils daily. 9.9 mL 1   furosemide (LASIX) 40 MG tablet Take 1 tablet (40 mg total) by mouth daily. 90 tablet 3   gabapentin (NEURONTIN) 300 MG capsule Take 600 mg by mouth 3 (three) times daily.      isosorbide mononitrate (IMDUR) 30 MG 24 hr tablet Take 0.5 tablets (15 mg total) by mouth at bedtime. 30 tablet 1   latanoprost (XALATAN) 0.005 % ophthalmic solution      linaclotide (LINZESS) 145 MCG CAPS capsule Take 1 capsule (145 mcg total) by mouth daily before breakfast. 90 capsule 3   loratadine (CLARITIN) 10 MG tablet Take 1 tablet (10 mg total) by mouth daily. 30 tablet 1   metoprolol succinate (TOPROL XL) 25 MG 24 hr tablet Take 1 tablet (25 mg total) by mouth 2 (two) times daily. 180 tablet 3   Multiple Vitamin (MULTIVITAMIN) capsule Take 1 capsule by mouth daily.     mupirocin ointment (BACTROBAN) 2 %  Apply 1 application. topically daily.     nitroGLYCERIN (NITROSTAT) 0.4 MG SL tablet Place 0.4 mg under the tongue every 5 (five) minutes as needed for chest pain (TAKE UP TO 3 DOSES BEFORE CALLING 911).     pantoprazole (PROTONIX) 40 MG tablet Take 40 mg by mouth 2 (two) times daily.     PFIZER COVID-19 VAC BIVALENT injection      polyethylene glycol powder (GLYCOLAX/MIRALAX) powder Take 17 g by mouth daily as needed for moderate constipation.  polyvinyl alcohol (LIQUIFILM TEARS) 1.4 % ophthalmic solution Place 1 drop into both eyes as needed for dry eyes.     potassium chloride (KLOR-CON) 10 MEQ tablet Take 1 tablet (10 mEq total) by mouth daily. 90 tablet 3   predniSONE (DELTASONE) 5 MG tablet Take 10 mg by mouth 2 (two) times daily.     rosuvastatin (CRESTOR) 10 MG tablet Take 10 mg by mouth at bedtime.     albuterol (VENTOLIN HFA) 108 (90 Base) MCG/ACT inhaler Inhale 2 puffs into the lungs in the morning, at noon, and at bedtime for 4 days, THEN 2 puffs every 6 (six) hours as needed for up to 4 days for wheezing or shortness of breath. (Patient not taking: Reported on 09/09/2021) 8 g 2   apixaban (ELIQUIS) 5 MG TABS tablet Take 2 tablets (10 mg total) by mouth 2 (two) times daily for 4 days, THEN 1 tablet (5 mg total) 2 (two) times daily. 60 tablet 1   tiotropium (SPIRIVA HANDIHALER) 18 MCG inhalation capsule Place 1 capsule (18 mcg total) into inhaler and inhale daily. (Patient not taking: Reported on 11/11/2021) 30 capsule 2   tiZANidine (ZANAFLEX) 4 MG tablet Take by mouth. (Patient not taking: Reported on 11/11/2021)     traMADol (ULTRAM) 50 MG tablet  (Patient not taking: Reported on 11/11/2021)     No facility-administered medications prior to visit.    Review of Systems  Constitutional:  Negative for chills, diaphoresis, fever, malaise/fatigue and weight loss.  HENT:  Negative for congestion.   Respiratory:  Negative for cough, hemoptysis, sputum production, shortness of breath and  wheezing.   Cardiovascular:  Negative for chest pain, palpitations and leg swelling.    Objective:   Vitals:   11/11/21 1425  BP: 130/72  Pulse: 64  SpO2: 95%  Weight: 202 lb 12.8 oz (92 kg)  Height: 5' 7.5" (1.715 m)   SpO2: 95 % O2 Device: None (Room air)  Physical Exam: General: Well-appearing, no acute distress HENT: [place], AT Eyes: EOMI, no scleral icterus Respiratory: Clear to auscultation bilaterally.  No crackles, wheezing or rales Cardiovascular: RRR, -M/R/G, no JVD Extremities:-Edema,-tenderness Neuro: AAO x4, CNII-XII grossly intact Psych: Normal mood, normal affect  Data Reviewed:  Imaging: CTA 07/13/21 Acute bilateral pulmonary embolus in lobar branches of RUL, RML, LUE LLL CT Chest 09/06/21 - Resolved right pleural effusion/consolidataion. Emphysema  PFT: None on file  Labs: CBC    Component Value Date/Time   WBC 9.3 07/19/2021 0451   RBC 4.26 07/19/2021 0451   HGB 11.7 (L) 07/19/2021 0451   HGB 12.3 04/06/2017 1135   HCT 38.7 07/19/2021 0451   HCT 37.0 04/06/2017 1135   PLT 177 07/19/2021 0451   PLT 190 04/06/2017 1135   MCV 90.8 07/19/2021 0451   MCV 91 04/06/2017 1135   MCH 27.5 07/19/2021 0451   MCHC 30.2 07/19/2021 0451   RDW 18.4 (H) 07/19/2021 0451   RDW 15.6 (H) 04/06/2017 1135   LYMPHSABS 1.3 07/14/2021 0458   LYMPHSABS 1.6 04/06/2017 1135   MONOABS 0.8 07/14/2021 0458   EOSABS 0.3 07/14/2021 0458   EOSABS 0.2 04/06/2017 1135   BASOSABS 0.1 07/14/2021 0458   BASOSABS 0.0 04/06/2017 1135    LE Dopplers: Acute R DVT present  Echo EF 60-65%. Grade I DD. No valvular or WMA    Assessment & Plan:   Discussion: 81 year old female with emphysema, OSA and submassive pulmonary embolism who presents for follow-up. Discussed risks and benefits of longer duration  of anticoagulation including bleed in setting of falls and advised for total 6 months maximum.  Acute bilateral submassive pulmonary embolism Right lower extremity DVT Small  right pleural effusion - resolved --CONTINUE Eliquis for 6 months --Recommend discontinuation of anticoagulation after this due to risk of bleed and falls --Encourage regular aerobic activity  Nocturnal hypoxemia 2/2 OSA OSA --Counseled on sleep hygiene --Counseled on weight loss/maintenance of healthy weight --Counseled NOT to drive if/when sleepy --Advised patient to wear CPAP for at least 4 hours each night for greater than 70% of the time to avoid the machine being repossessed by insurance. --Counseled on weight loss/maintenance of healthy weight. Consider Inspire device if we we can achieve BMI <32 --Will need a sleep study in the future if she needs a new machine or if she is interested in [place] with Physical Therapy  Health Maintenance Immunization History  Administered Date(s) Administered   Influenza Whole 03/05/2008, 03/06/2009, 05/11/2010   Influenza, High Dose Seasonal PF 03/19/2014, 04/06/2017, 04/02/2018, 02/15/2019   Moderna Sars-Covid-2 Vaccination 08/31/2019   PFIZER(Purple Top)SARS-COV-2 Vaccination 08/10/2019   Pneumococcal Polysaccharide-23 05/11/2010   CT Lung Screen - not qualified   No orders of the defined types were placed in this encounter.  Meds ordered this encounter  Medications   apixaban (ELIQUIS) 5 MG TABS tablet    Sig: Take 1 tablet (5 mg total) by mouth 2 (two) times daily.    Dispense:  60 tablet    Refill:  1    Return in about 4 months (around 03/13/2022).  I have spent a total time of 32-minutes on the day of the appointment including chart review, data review, collecting history, coordinating care and discussing medical diagnosis and plan with the patient/family. Past medical history, allergies, medications were reviewed. Pertinent imaging, labs and tests included in this note have been reviewed and interpreted independently by me.  [place], MD [facility] 11/11/2021 2:38 PM   Office Number [phone]

## 2021-11-11 NOTE — Patient Instructions (Signed)
Acute bilateral submassive pulmonary embolism Right lower extremity DVT --CONTINUE Eliquis for 6 months (end of July) --Recommend discontinuation of anticoagulation after this date --Encourage regular aerobic activity  Nocturnal hypoxemia 2/2 OSA OSA --Counseled on sleep hygiene --Counseled on weight loss/maintenance of healthy weight --Counseled NOT to drive if/when sleepy --Advised patient to wear CPAP for at least 4 hours each night for greater than 70% of the time to avoid the machine being repossessed by insurance. --Counseled on weight loss/maintenance of healthy weight. Consider Inspire device if we we can achieve BMI <32 --Will need a sleep study in the future if she needs a new machine or if she is interested in [facility] continue with Physical Therapy  Follow-up with me in 4 months

## 2021-11-15 DIAGNOSIS — M461 Sacroiliitis, not elsewhere classified: Secondary | ICD-10-CM | POA: Diagnosis not present

## 2021-11-16 ENCOUNTER — Telehealth: Payer: Self-pay | Admitting: Cardiovascular Disease

## 2021-11-16 DIAGNOSIS — Z79899 Other long term (current) drug therapy: Secondary | ICD-10-CM

## 2021-11-16 NOTE — Telephone Encounter (Signed)
Pt c/o medication issue:  1. Name of Medication:  metoprolol succinate (TOPROL XL) 25 MG 24 hr tablet potassium chloride (KLOR-CON) 10 MEQ tablet  2. How are you currently taking this medication (dosage and times per day)? As prescribed   3. Are you having a reaction (difficulty breathing--STAT)? No   4. What is your medication issue? Patient is calling requesting a callback to discuss being switched back to metoprolol tartrate instead of succinate. She is also wanting to discuss if there is a cheaper form of potasium that can be prescribed as well as increasing it to twice daily, once in the morning and night due to still having cramps. Please advise.

## 2021-11-17 ENCOUNTER — Ambulatory Visit: Payer: PPO | Admitting: Podiatry

## 2021-11-17 ENCOUNTER — Encounter: Payer: Self-pay | Admitting: Podiatry

## 2021-11-17 DIAGNOSIS — M79674 Pain in right toe(s): Secondary | ICD-10-CM

## 2021-11-17 DIAGNOSIS — B351 Tinea unguium: Secondary | ICD-10-CM

## 2021-11-17 DIAGNOSIS — D689 Coagulation defect, unspecified: Secondary | ICD-10-CM | POA: Diagnosis not present

## 2021-11-17 DIAGNOSIS — L84 Corns and callosities: Secondary | ICD-10-CM

## 2021-11-17 DIAGNOSIS — I739 Peripheral vascular disease, unspecified: Secondary | ICD-10-CM

## 2021-11-17 DIAGNOSIS — M79675 Pain in left toe(s): Secondary | ICD-10-CM | POA: Diagnosis not present

## 2021-11-17 MED ORDER — METOPROLOL TARTRATE 25 MG PO TABS: 25.0000 mg | ORAL_TABLET | Freq: Two times a day (BID) | ORAL | 3 refills | Status: DC

## 2021-11-17 NOTE — Telephone Encounter (Signed)
Okay to change metoprolol succinate back to metoprolol tartrate at the previous dose.  Rather than increasing her potassium supplement, I would prefer that she come in for a metabolic panel and magnesium level to check the status of her electrolytes.

## 2021-11-17 NOTE — Telephone Encounter (Signed)
Left message for patient (per DPR) stating that we are chaning the metoprolol back to the immdiate release (lopressor) and have sent that script into her pharmacy. Orders have been placed for Mag level and BMET to check her electrolytes before increasing potassium. Awaiting call back from patient to schedule these.

## 2021-11-17 NOTE — Telephone Encounter (Signed)
Contacted patient who states that she is still having "terrible cramping in my feet and even my hands" and is wanting to see if we will increase her potassium to twice daily/20 mEQ) daily to see if it helps her. She also states that she has been on Metoprolol Tartrate in the past and when here in March Dr Burt Knack changed her to Metoprolol Succinate and her copay is much higher with this. She's requesting to go back on Lopressor if possible.  Per OV note on 3/30: Currently appears stable with no symptoms of angina.  We will increase her metoprolol to 25 mg twice daily. 4. The patient has more edema today.  She has had some problems related to amlodipine in the past with lower extremity edema.  I asked her to stop amlodipine today.  We will increase metoprolol to 25 mg twice daily.  If she develops more trouble with hypertension, she probably would do better with an ARB or ARB/thiazide diuretic and low-dose combination.   I questioned pt about her BP and she states that "its been good." I asked for readings which she couldn't readily provide, but states that she can't tell a difference in her pressure with the extended-release Metoprolol versus the immediate release. Pt states she is still using Amlodipine and her pressure is fine. Explained to patient that Burt Knack had stopped her Amlodipine at last visit due to swelling. She seems to have forgotten this. I will review her meds with her when I get recommendations from MD about the above mentioned questions of patient.

## 2021-11-17 NOTE — Addendum Note (Signed)
Addended by: Ma Hillock on: 11/17/2021 11:22 AM   Modules accepted: Orders

## 2021-11-17 NOTE — Progress Notes (Signed)
Subjective: Stephanie Alvarado is a 81 y.o. female patient presents for routine nail care and callus care. Relates no changes since last visit. Patient also requests nail trim this visit. She is on eliquis and at risk for rfc.   Patient Active Problem List   Diagnosis Date Noted   Nocturnal hypoxemia 11/11/2021   Opacity of lung on imaging study 07/13/2021   Acute saddle pulmonary embolism ([place]) 07/12/2021   Acute on chronic respiratory failure with hypoxia ([place]) 07/12/2021   Chest pain 07/11/2021   Daytime sleepiness 07/10/2021   AKI (acute kidney injury) ([place]) 07/08/2021   Chronic diastolic CHF (congestive heart failure) ([place]) 07/07/2021   Thrombocytopenia ([place]) 07/07/2021   Chronic respiratory failure with hypoxia, on home oxygen therapy ([place]) 07/07/2021   Hypokalemia 07/07/2021   Primary osteoarthritis of left knee 12/23/2019   Diarrhea 09/06/2017   Chronic RLQ pain 05/26/2017   Lightheadedness    Ataxia 05/30/2016   Dizziness 05/30/2016   Preoperative clearance 11/10/2015   Varicose veins of left lower extremity with complications 81/19/1478   Varicose veins of leg with complications 29/56/2130   Lumbar scoliosis 11/21/2013   Preop cardiovascular exam 10/29/2013   LLQ abdominal pain 04/05/2013   Diverticulitis 04/05/2013   Palpitations    Subclavian artery disease (HCC)    PFO (patent foramen ovale)    CAD (coronary artery disease)    Ejection fraction    Atherosclerosis of aorta ([place])    Carotid artery disease ([place])    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   Chronic constipation 06/11/2010   ABDOMINAL PAIN-LLQ 06/11/2010   History of adenomatous polyp of colon 06/11/2010   OSA (obstructive sleep apnea) 07/17/2009   Stroke ([place]) 11/11/2008   COLONIC POLYPS 08/31/2008   Thyroid nodule 08/31/2008   VENOUS INSUFFICIENCY 08/31/2008   DIVERTICULOSIS OF COLON 08/31/2008   LOW BACK PAIN, CHRONIC 08/31/2008   FIBROMYALGIA 08/31/2008   Dyspnea 08/08/2008    HYPERCHOLESTEROLEMIA 04/19/2007   OBESITY 04/19/2007   ANEMIA 04/19/2007   ANXIETY 04/19/2007   Essential hypertension 04/19/2007   ALLERGIC RHINITIS 04/19/2007   PULMONARY NODULE, RIGHT LOWER LOBE 04/19/2007   GERD 04/19/2007   OA (osteoarthritis) of knee 04/19/2007   Current Outpatient Medications on File Prior to Visit  Medication Sig Dispense Refill   albuterol (VENTOLIN HFA) 108 (90 Base) MCG/ACT inhaler Inhale 2 puffs into the lungs in the morning, at noon, and at bedtime for 4 days, THEN 2 puffs every 6 (six) hours as needed for up to 4 days for wheezing or shortness of breath. (Patient not taking: Reported on 09/09/2021) 8 g 2   apixaban (ELIQUIS) 5 MG TABS tablet Take 2 tablets (10 mg total) by mouth 2 (two) times daily for 4 days, THEN 1 tablet (5 mg total) 2 (two) times daily. 60 tablet 1   apixaban (ELIQUIS) 5 MG TABS tablet Take 1 tablet (5 mg total) by mouth 2 (two) times daily. 60 tablet 1   buPROPion (WELLBUTRIN XL) 150 MG 24 hr tablet Take 300 mg by mouth daily.     cyclobenzaprine (FLEXERIL) 10 MG tablet      cycloSPORINE (RESTASIS) 0.05 % ophthalmic emulsion      docusate sodium (COLACE) 100 MG capsule Take 100 mg by mouth daily as needed for mild constipation.     fluticasone (FLONASE) 50 MCG/ACT nasal spray Place 2 sprays into both nostrils daily. 9.9 mL 1   furosemide (LASIX) 40 MG tablet Take 1 tablet (40 mg total) by mouth daily. 90 tablet  3   gabapentin (NEURONTIN) 300 MG capsule Take 600 mg by mouth 3 (three) times daily.      isosorbide mononitrate (IMDUR) 30 MG 24 hr tablet Take 0.5 tablets (15 mg total) by mouth at bedtime. 30 tablet 1   latanoprost (XALATAN) 0.005 % ophthalmic solution      linaclotide (LINZESS) 145 MCG CAPS capsule Take 1 capsule (145 mcg total) by mouth daily before breakfast. 90 capsule 3   loratadine (CLARITIN) 10 MG tablet Take 1 tablet (10 mg total) by mouth daily. 30 tablet 1   metoprolol tartrate (LOPRESSOR) 25 MG tablet Take 1 tablet  (25 mg total) by mouth 2 (two) times daily. 180 tablet 3   Multiple Vitamin (MULTIVITAMIN) capsule Take 1 capsule by mouth daily.     mupirocin ointment (BACTROBAN) 2 % Apply 1 application. topically daily.     nitroGLYCERIN (NITROSTAT) 0.4 MG SL tablet Place 0.4 mg under the tongue every 5 (five) minutes as needed for chest pain (TAKE UP TO 3 DOSES BEFORE CALLING 911).     pantoprazole (PROTONIX) 40 MG tablet Take 40 mg by mouth 2 (two) times daily.     PFIZER COVID-19 VAC BIVALENT injection      polyethylene glycol powder (GLYCOLAX/MIRALAX) powder Take 17 g by mouth daily as needed for moderate constipation.      polyvinyl alcohol (LIQUIFILM TEARS) 1.4 % ophthalmic solution Place 1 drop into both eyes as needed for dry eyes.     potassium chloride (KLOR-CON) 10 MEQ tablet Take 1 tablet (10 mEq total) by mouth daily. 90 tablet 3   predniSONE (DELTASONE) 5 MG tablet Take 10 mg by mouth 2 (two) times daily.     rosuvastatin (CRESTOR) 10 MG tablet Take 10 mg by mouth at bedtime.     tiotropium (SPIRIVA HANDIHALER) 18 MCG inhalation capsule Place 1 capsule (18 mcg total) into inhaler and inhale daily. (Patient not taking: Reported on 11/11/2021) 30 capsule 2   tiZANidine (ZANAFLEX) 4 MG tablet Take by mouth. (Patient not taking: Reported on 11/11/2021)     traMADol (ULTRAM) 50 MG tablet  (Patient not taking: Reported on 11/11/2021)     No current facility-administered medications on file prior to visit.   Allergies  Allergen Reactions   Codeine Itching    REACTION: itch      Objective: There were no vitals filed for this visit.  General: Patient is awake, alert, oriented x 3 and in no acute distress.  Dermatology: Skin is warm and dry bilateral with hemorrhagic callus noted at the distal tuft of the right third toe once debrided there was no underlying opening, there is no malodor, no active drainage, no blanchable erythema, minimal edema. No acute signs of infection.  Nails x9 are thickened and  elongated consistent with onychomycosis.  Patient has very minimal nail on the left hallux from previous avulsion.   Vascular: Dorsalis Pedis pulse = 1/4 Bilateral,  Posterior Tibial pulse = 0/4 Bilateral,  Capillary Fill Time < 5 seconds, varicosities noted bilateral.  Neurologic: Gross sensation present via light touch..  Musculosketal: There is minimal pain to palpation to the right third toe, hammertoe deformity noted.    No results for input(s): GRAMSTAIN, LABORGA in the last 8760 hours.  Assessment and Plan:  Problem List Items Addressed This Visit   None Visit Diagnoses     Pre-ulcerative calluses    -  Primary   Pain due to onychomycosis of toenails of both feet  PVD (peripheral vascular disease) ([place])            -Examined patient -Mechanically debrided all painful nails x9 using a sterile nail nipper without incident -Mechanically debrided preulcerative callus to the right third toe using a sterile 15 blade there was no underlying opening noted patient tolerated the debridement of the preulcerative callus at the right third toe distal tuft well without need for anesthesia -Advised patient to avoid shoes that could rub toe like previous -Advised patient if soreness returns may resume soaking with Epsom salt with assistance -Discussed at next visit maybe able to try a tenotomy of right third toe to reduce pressure on the tip and reduce callus.  -Return to office in 3 months for follow up nail and callus care and evaluation or sooner if problems arise.  Lorenda Peck, DPM

## 2021-11-23 DIAGNOSIS — H348312 Tributary (branch) retinal vein occlusion, right eye, stable: Secondary | ICD-10-CM | POA: Diagnosis not present

## 2021-11-23 DIAGNOSIS — H35033 Hypertensive retinopathy, bilateral: Secondary | ICD-10-CM | POA: Diagnosis not present

## 2021-11-23 DIAGNOSIS — H40023 Open angle with borderline findings, high risk, bilateral: Secondary | ICD-10-CM | POA: Diagnosis not present

## 2021-11-26 DIAGNOSIS — R0902 Hypoxemia: Secondary | ICD-10-CM | POA: Diagnosis not present

## 2021-11-26 DIAGNOSIS — G4733 Obstructive sleep apnea (adult) (pediatric): Secondary | ICD-10-CM | POA: Diagnosis not present

## 2021-11-26 DIAGNOSIS — I10 Essential (primary) hypertension: Secondary | ICD-10-CM | POA: Diagnosis not present

## 2021-12-10 DIAGNOSIS — J441 Chronic obstructive pulmonary disease with (acute) exacerbation: Secondary | ICD-10-CM | POA: Diagnosis not present

## 2021-12-10 DIAGNOSIS — E7849 Other hyperlipidemia: Secondary | ICD-10-CM | POA: Diagnosis not present

## 2021-12-10 DIAGNOSIS — N183 Chronic kidney disease, stage 3 unspecified: Secondary | ICD-10-CM | POA: Diagnosis not present

## 2021-12-28 DIAGNOSIS — M461 Sacroiliitis, not elsewhere classified: Secondary | ICD-10-CM | POA: Diagnosis not present

## 2021-12-31 ENCOUNTER — Telehealth: Payer: Self-pay | Admitting: Gastroenterology

## 2021-12-31 MED ORDER — METRONIDAZOLE 500 MG PO TABS: 500.0000 mg | ORAL_TABLET | Freq: Two times a day (BID) | ORAL | 0 refills | Status: AC

## 2021-12-31 MED ORDER — CIPROFLOXACIN HCL 500 MG PO TABS: 500.0000 mg | ORAL_TABLET | Freq: Two times a day (BID) | ORAL | 0 refills | Status: AC

## 2021-12-31 NOTE — Telephone Encounter (Signed)
2 weeks ago the pt developed LLQ pain and abd swelling.  She has a history of diverticulitis in which she was hospitalized for back in January this year.  She has no fever, no rectal bleeding but she is very fatigued.  She has tenderness on the left side and cannot lay on that side due to  worse pain.  Heating pad does help some but the pain is not resolved.  She is a pt of Dr Henrene Pastor and was last seen by Janett Billow 06/25/20.  She has an appt with Estill Bamberg on 8/8 but would like abx until that time.  Can you please advise as DOD?

## 2021-12-31 NOTE — Telephone Encounter (Signed)
Spoke with patient, she states that she is having pain due to possible diverticulitis. Request a call back to advise.

## 2021-12-31 NOTE — Telephone Encounter (Signed)
Patty, I have reviewed the patient's chart.  Hard to imagine that she would be doing significantly poorly and not already been in the hospital if she was having 2 weeks worth of left lower quadrant abdominal pain.  However she has documented developing diverticulitis as of the January CT scan and was treated for 10 days both with time in the hospital as well as as an outpatient. I am okay for her to be initiated on antibiotics with follow-up call next week to see how she is doing.  She can keep the current date with PA Silverio Lay in August unless something else develops. If next week when she is called, she is still experiencing discomfort then recommend likely CT abdomen/pelvis with IV and oral contrast but can ultimately defer that to patient's primary gastroenterologist, Dr. Henrene Pastor. Okay for ciprofloxacin 500 mg/Flagyl 500 mg twice daily x10 days or Augmentin 875 mg twice daily x10 days per patient preference as I see no allergy reactions to either regimens. Thanks. GM  FYI JP

## 2021-12-31 NOTE — Telephone Encounter (Signed)
The pt has been advised and will call next week with an update. She agrees to cipro and flagyl. I have sent these to her pharmacy. She will keep appt with Estill Bamberg as planned and call next week to update.

## 2022-01-04 NOTE — Telephone Encounter (Signed)
Assessment, treatment plans and follow up all noted

## 2022-01-12 ENCOUNTER — Telehealth: Payer: Self-pay | Admitting: Pulmonary Disease

## 2022-01-12 NOTE — Telephone Encounter (Signed)
Patient called and she states that she is almost out of Eliquis. She wants to know if Dr Loanne Drilling wants her to continue this medication.   NO issues noted since starting on Eliquis  Please advise Dr Loanne Drilling if you want patient to continue Eliquis

## 2022-01-14 NOTE — Telephone Encounter (Signed)
Per her last clinic note, I advised a total of 6 months of anticoagulation (Feb to August 2023). If she wishes for further discussion ok to schedule sooner appointment if available

## 2022-01-14 NOTE — Telephone Encounter (Signed)
She has completed her 6 month course of Eliquis that was recommended.  If she wishes to discuss risks and benefits of restarting anticoagulation I am happy to see her or she can be referred to Hematology to discuss if she needs additional anticoagulation.

## 2022-01-14 NOTE — Telephone Encounter (Signed)
Called patient and she states that she has appointment with Dr Loanne Drilling in October. She is wondering if we could send in a short rx of the eliquis until her follow up with Dr Loanne Drilling. Patient did not wish to come in sooner.   Please advise Dr Loanne Drilling

## 2022-01-17 ENCOUNTER — Other Ambulatory Visit: Payer: Self-pay | Admitting: Pulmonary Disease

## 2022-01-17 NOTE — Telephone Encounter (Signed)
Called patient and left voicemail for her to call office and make an appointment to see Dr Loanne Drilling sooner to talk about Eliquis. Nothing further needed

## 2022-01-17 NOTE — Progress Notes (Unsigned)
01/18/2022 Stephanie Alvarado [id number] Feb 01, 1941  Referring provider: Caryl Bis, MD Primary GI doctor: Dr. Henrene Pastor  ASSESSMENT AND PLAN:   Assessment: 81 y.o. female here for assessment of the following: 1. Chronic constipation   2. Left lower quadrant abdominal pain   3. Diverticulosis of colon   4. History of adenomatous polyp of colon   5. Coronary artery disease involving native coronary artery of native heart without angina pectoris   6. History of pulmonary embolus (PE)   7. History of stroke   8. Chronic respiratory failure with hypoxia, on home oxygen therapy (HCC)   9. Vaginal yeast infection    Chronic left lower quadrant pain, recent hospitalization in January for diverticulitis, complicated by PE with massive clot burden, currently on Eliquis. Patient called the office in July with complaints of worsening left lower quadrant pain, Sidden Cipro and Flagyl which she completed, but for the past 2 days worsening left lower quadrant pain again. Patient has chills, pain is worse with movement, denies nausea, vomiting  Patient does walk with a cane, has lower back pain, and also has known pelvic prolapse which could also be contributing to patient's discomfort if CT is negative.  Patient's had a chronic lower abdominal pain for years, most recently just having the diverticulitis.  Last colonoscopy 2013, at this time patient is very high risk for any endoscopic evaluation.    Plan: Will get labs to evaluate for infection. Will get CT abdomen pelvis with contrast, start patient on Augmentin, no allergy or contraindication. Nystatin cream sent in as patient has yeast infection from antibiotics and states this is worked in the past. Add on fiber supplement, avoid NSAIDS, information given Consider following up with GYN for pelvic floor, follow-up with PCP for lower back pain  Unless patient has IDA, rectal bleeding, would not suggest endoscopic evaluation at this  time.  Orders Placed This Encounter  Procedures   CT ABDOMEN PELVIS W CONTRAST   Comprehensive metabolic panel   CBC with Differential/Platelet    Meds ordered this encounter  Medications   dicyclomine (BENTYL) 20 MG tablet    Sig: Take 1 tablet (20 mg total) by mouth every 6 (six) hours.    Dispense:  10 tablet    Refill:  0   amoxicillin-clavulanate (AUGMENTIN) 875-125 MG tablet    Sig: Take 1 tablet by mouth 2 (two) times daily for 14 days.    Dispense:  28 tablet    Refill:  0   nystatin cream (MYCOSTATIN)    Sig: Apply 1 Application topically 2 (two) times daily.    Dispense:  30 g    Refill:  1      History of Present Illness:  81 y.o. female  with a past medical history of anxiety, coronary artery disease status post stent 2011, hypertension, hyperlipidemia, sleep-related hypoventilation with CPAP and intermittent oxygen and others listed below, returns to clinic today for evaluation of left lower quadrant abdominal pain.  04/2012 colonoscopy 2 flat polyps tubular adenomas, severe diverticulosis descending sigmoid colon 09/15/2017 CT abdomen pelvis showed diverticulosis without diverticulitis, small hiatal hernia lumbar fusions, post cholecystectomy and hysterectomy 06/25/2020 left-sided abdominal pain seen by Alonza Bogus in the office reporting constipation, was hoping to get colonoscopy.  Due to age and comorbidities this was not advised.   07/06/2021 CT abdomen pelvis for right lower quadrant pain concerning for diverticulitis of small bowel in the mid abdomen no abscess or perforation, was there for 2 weeks,  during that hospitalization had hypoxia/chest pain with subsequent CTA on  07/12/2021 demonstrated extensive bilateral pulmonary embolus with high clot burden with right heart strain small type I hiatal hernia small right pleural effusion and opacity in right lower lobe. She continues to use O2 intermittently since in the hospital and has SOB with exertion, she is  on Eliquis.   Patient states this past Jan she was having AB pain, worse than anything prior, had diverticulitis and was in the hospital for 2 weeks.  She states [place] the beach in [place] and if she turned to the left side felt she was getting "split open", she called the office July 21st with symptoms, took cipro/flagyl and was feeling better.  She has finished the ABX and then the last 2 days the pain has started back. Worse with movement, better with sitting. She has been on miralax, has been having better stools.  Denies nausea or vomiting.  She has had chills but no fever, thought it was from the eliquis.  No weight loss.  She states she has chronic left lower leg pain, walks with a cane.  She has urinary incontinence and feels heaviness in her vagina, tried pessery with GYN but could not tolerate it.  Does have yeast infection from ABX.   Current Medications:    Current Outpatient Medications (Cardiovascular):    furosemide (LASIX) 40 MG tablet, Take 1 tablet (40 mg total) by mouth daily.   isosorbide mononitrate (IMDUR) 30 MG 24 hr tablet, Take 0.5 tablets (15 mg total) by mouth at bedtime.   nitroGLYCERIN (NITROSTAT) 0.4 MG SL tablet, Place 0.4 mg under the tongue every 5 (five) minutes as needed for chest pain (TAKE UP TO 3 DOSES BEFORE CALLING 911).   rosuvastatin (CRESTOR) 10 MG tablet, Take 10 mg by mouth at bedtime.  Current Outpatient Medications (Respiratory):    fluticasone (FLONASE) 50 MCG/ACT nasal spray, Place 2 sprays into both nostrils daily.   loratadine (CLARITIN) 10 MG tablet, Take 1 tablet (10 mg total) by mouth daily.   Current Outpatient Medications (Hematological):    apixaban (ELIQUIS) 5 MG TABS tablet, Take 1 tablet (5 mg total) by mouth 2 (two) times daily.  Current Outpatient Medications (Other):    amoxicillin-clavulanate (AUGMENTIN) 875-125 MG tablet, Take 1 tablet by mouth 2 (two) times daily for 14 days.   buPROPion (WELLBUTRIN XL) 150 MG 24 hr  tablet, Take 300 mg by mouth daily.   dicyclomine (BENTYL) 20 MG tablet, Take 1 tablet (20 mg total) by mouth every 6 (six) hours.   docusate sodium (COLACE) 100 MG capsule, Take 100 mg by mouth daily as needed for mild constipation.   gabapentin (NEURONTIN) 300 MG capsule, Take 600 mg by mouth 3 (three) times daily.    latanoprost (XALATAN) 0.005 % ophthalmic solution,    nystatin cream (MYCOSTATIN), Apply 1 Application topically 2 (two) times daily.   OXYGEN, Inhale into the lungs. 2 liters prn   pantoprazole (PROTONIX) 40 MG tablet, Take 40 mg by mouth 2 (two) times daily.   polyethylene glycol powder (GLYCOLAX/MIRALAX) powder, Take 17 g by mouth daily as needed for moderate constipation.    polyvinyl alcohol (LIQUIFILM TEARS) 1.4 % ophthalmic solution, Place 1 drop into both eyes as needed for dry eyes.   potassium chloride (KLOR-CON) 10 MEQ tablet, Take 1 tablet (10 mEq total) by mouth daily.   tiZANidine (ZANAFLEX) 4 MG tablet, Take by mouth.   PFIZER COVID-19 VAC BIVALENT injection,   Surgical  History:  She  has a past surgical history that includes Coronary angioplasty with stent (3846'K); Laparoscopic hysterectomy; urethral suspension (1993); Lumbar disc surgery; Cholecystectomy; Varicose vein surgery; cataracts; Back surgery; RIGHT/LEFT HEART CATH AND CORONARY ANGIOGRAPHY (N/A, 04/10/2017); Achilles tendon surgery (Right, 04/05/2018); Gastrocnemius Recession (Right, 04/05/2018); Total knee arthroplasty (Right, 01/14/2019); and Total knee arthroplasty (Left, 12/23/2019). Family History:  Her family history includes Alzheimer's disease in her mother; Breast cancer in an other family member; Colon cancer in her paternal aunt; Heart attack in her father; Heart disease in her father. Social History:   reports that she quit smoking about 23 years ago. Her smoking use included cigarettes. She has a 40.00 pack-year smoking history. She has never used smokeless tobacco. She reports that she does not  drink alcohol and does not use drugs.  Current Medications, Allergies, Past Medical History, Past Surgical History, Family History and Social History were reviewed in [employer] record.  Physical Exam: BP 130/70   Pulse 82   Ht '5\' 7"'$  (1.702 m)   Wt 209 lb (94.8 kg)   BMI 32.73 kg/m  General:   Pleasant, obese female in no acute distress Heart : Regular rate and rhythm; no murmurs Pulm: Diffusely decreased breath sounds Abdomen:  Soft, Obese AB, Active bowel sounds. moderate tenderness in the LLQ. With guarding and Without rebound, No organomegaly appreciated. Rectal: Not evaluated Extremities:  with  edema, mild non pitting, walks with cane.  Neurologic:  Alert and  oriented x4;  No focal deficits.  Psych:  Cooperative. Normal mood and affect.   Vladimir Crofts, PA-C 01/18/22

## 2022-01-18 ENCOUNTER — Other Ambulatory Visit (INDEPENDENT_AMBULATORY_CARE_PROVIDER_SITE_OTHER): Payer: PPO

## 2022-01-18 ENCOUNTER — Encounter: Payer: Self-pay | Admitting: Physician Assistant

## 2022-01-18 ENCOUNTER — Ambulatory Visit (INDEPENDENT_AMBULATORY_CARE_PROVIDER_SITE_OTHER): Payer: PPO | Admitting: Physician Assistant

## 2022-01-18 ENCOUNTER — Telehealth: Payer: Self-pay | Admitting: Pulmonary Disease

## 2022-01-18 VITALS — BP 130/70 | HR 82 | Ht 67.0 in | Wt 209.0 lb

## 2022-01-18 DIAGNOSIS — I2510 Atherosclerotic heart disease of native coronary artery without angina pectoris: Secondary | ICD-10-CM

## 2022-01-18 DIAGNOSIS — Z9981 Dependence on supplemental oxygen: Secondary | ICD-10-CM | POA: Diagnosis not present

## 2022-01-18 DIAGNOSIS — B3731 Acute candidiasis of vulva and vagina: Secondary | ICD-10-CM

## 2022-01-18 DIAGNOSIS — R1032 Left lower quadrant pain: Secondary | ICD-10-CM | POA: Diagnosis not present

## 2022-01-18 DIAGNOSIS — K5730 Diverticulosis of large intestine without perforation or abscess without bleeding: Secondary | ICD-10-CM

## 2022-01-18 DIAGNOSIS — Z86711 Personal history of pulmonary embolism: Secondary | ICD-10-CM

## 2022-01-18 DIAGNOSIS — Z8673 Personal history of transient ischemic attack (TIA), and cerebral infarction without residual deficits: Secondary | ICD-10-CM | POA: Diagnosis not present

## 2022-01-18 DIAGNOSIS — K5909 Other constipation: Secondary | ICD-10-CM | POA: Diagnosis not present

## 2022-01-18 DIAGNOSIS — Z86010 Personal history of colonic polyps: Secondary | ICD-10-CM | POA: Diagnosis not present

## 2022-01-18 DIAGNOSIS — J9611 Chronic respiratory failure with hypoxia: Secondary | ICD-10-CM | POA: Diagnosis not present

## 2022-01-18 DIAGNOSIS — I2692 Saddle embolus of pulmonary artery without acute cor pulmonale: Secondary | ICD-10-CM

## 2022-01-18 LAB — COMPREHENSIVE METABOLIC PANEL
ALT: 7 U/L (ref 0–35)
AST: 12 U/L (ref 0–37)
Albumin: 4 g/dL (ref 3.5–5.2)
Alkaline Phosphatase: 76 U/L (ref 39–117)
BUN: 14 mg/dL (ref 6–23)
CO2: 31 mEq/L (ref 19–32)
Calcium: 10 mg/dL (ref 8.4–10.5)
Chloride: 103 mEq/L (ref 96–112)
Creatinine, Ser: 0.93 mg/dL (ref 0.40–1.20)
GFR: 57.09 mL/min — ABNORMAL LOW (ref >60.00)
Glucose, Bld: 107 mg/dL — ABNORMAL HIGH (ref 70–99)
Potassium: 4.6 mEq/L (ref 3.5–5.1)
Sodium: 140 mEq/L (ref 135–145)
Total Bilirubin: 0.5 mg/dL (ref 0.2–1.2)
Total Protein: 6.8 g/dL (ref 6.0–8.3)

## 2022-01-18 LAB — CBC WITH DIFFERENTIAL/PLATELET
Basophils Absolute: 0.1 10*3/uL (ref 0.0–0.1)
Basophils Relative: 1.1 % (ref 0.0–3.0)
Eosinophils Absolute: 0.2 10*3/uL (ref 0.0–0.7)
Eosinophils Relative: 2.8 % (ref 0.0–5.0)
HCT: 37.8 % (ref 36.0–46.0)
Hemoglobin: 12 g/dL (ref 12.0–15.0)
Lymphocytes Relative: 23 % (ref 12.0–46.0)
Lymphs Abs: 1.6 10*3/uL (ref 0.7–4.0)
MCHC: 31.8 g/dL (ref 30.0–36.0)
MCV: 82.7 fl (ref 78.0–100.0)
Monocytes Absolute: 0.8 10*3/uL (ref 0.1–1.0)
Monocytes Relative: 10.9 % (ref 3.0–12.0)
Neutro Abs: 4.4 10*3/uL (ref 1.4–7.7)
Neutrophils Relative %: 62.2 % (ref 43.0–77.0)
Platelets: 120 10*3/uL — ABNORMAL LOW (ref 150.0–400.0)
RBC: 4.57 Mil/uL (ref 3.87–5.11)
RDW: 21.2 % — ABNORMAL HIGH (ref 11.5–15.5)
WBC: 7.1 10*3/uL (ref 4.0–10.5)

## 2022-01-18 MED ORDER — AMOXICILLIN-POT CLAVULANATE 875-125 MG PO TABS: 1.0000 | ORAL_TABLET | Freq: Two times a day (BID) | ORAL | 0 refills | Status: AC

## 2022-01-18 MED ORDER — NYSTATIN 100000 UNIT/GM EX CREA: 1.0000 | TOPICAL_CREAM | Freq: Two times a day (BID) | CUTANEOUS | 1 refills | Status: DC

## 2022-01-18 MED ORDER — DICYCLOMINE HCL 20 MG PO TABS: 20.0000 mg | ORAL_TABLET | Freq: Four times a day (QID) | ORAL | 0 refills | Status: DC

## 2022-01-18 MED ORDER — APIXABAN 5 MG PO TABS: 5.0000 mg | ORAL_TABLET | Freq: Two times a day (BID) | ORAL | 0 refills | Status: DC

## 2022-01-18 NOTE — Telephone Encounter (Signed)
Called and spoke with pt letting her know the info per JE and she verbalized understanding. Referral placed and Rx for Eliquis sent to preferred pharmacy. Nothing further needed.

## 2022-01-18 NOTE — Telephone Encounter (Signed)
Spoke with the pt  She is fearful of stopping her Eliquis  She was scheduled appt to discuss this- 02/07/22  She is asking that we send enough to last at least until that visit  Please advise, thanks!

## 2022-01-18 NOTE — Progress Notes (Signed)
Assessment and plan noted ?

## 2022-01-18 NOTE — Telephone Encounter (Signed)
Ok to fill one month supply. Also place referral to Hematology Oncology for w/u for PE and need for anticoagulation.

## 2022-01-18 NOTE — Patient Instructions (Addendum)
Your provider has requested that you go to the basement level for lab work before leaving today. Press "B" on the elevator. The lab is located at the first door on the left as you exit the elevator.   Will give Augmentin  Set up CT AB and pelvis with contrast Add on fiber supplement like BENEFIBER 1-2 x a day Can take dicyclomine at least 1-2 x a day for pain if needed.   Can do heating pad and can take tylenol max of 3036m a day.  Can add on lidocaine patches or voltern gel Go to the ER if unable to pass gas, severe AB pain, unable to hold down food, any shortness of breath of chest pain.  Diverticulosis Diverticulosis is a condition that develops when small pouches (diverticula) form in the wall of the large intestine (colon). The colon is where water is absorbed and stool (feces) is formed. The pouches form when the inside layer of the colon pushes through weak spots in the outer layers of the colon. You may have a few pouches or many of them. The pouches usually do not cause problems unless they become inflamed or infected. When this happens, the condition is called diverticulitis- this is left lower quadrant pain, diarrhea, fever, chills, nausea or vomiting.  If this occurs please call the office or go to the hospital. Sometimes these patches without inflammation can also have painless bleeding associated with them, if this happens please call the office or go to the hospital. Preventing constipation and increasing fiber can help reduce diverticula and prevent complications. Even if you feel you have a high-fiber diet, suggest getting on Benefiber or Cirtracel 2 times daily.  You may have POST INFECTIOUS IBS OR IRRITABLE BOWEL After an infection or diverticulitis flare your intestines can spasm or be a little bit more sensitive. Try these things below:  Can do BRAT diet versus low FODMAP- see below Try trial off milk/lactose products.  Add fiber like benefiber or citracel once a  day Can do trial of IBGard for AB pain EVERY DAY- Take 1-2 capsules once a day for maintence or twice a day during a flare Can take dicyclomine as needed.  if any worsening symptoms like blood in stool, weight loss, please call the office or go to the ER.    FODMAP stands for fermentable oligo-, di-, mono-saccharides and polyols (1). These are the scientific terms used to classify groups of carbs that are notorious for triggering digestive symptoms like bloating, gas and stomach pain.   FODMAPs are found in a wide range of foods in varying amounts. Some foods contain just one type, while others contain several.  The main dietary sources of the four groups of FODMAPs include:  Oligosaccharides: Wheat, rye, legumes and various fruits and vegetables, such as garlic and onions.  Disaccharides: Milk, yogurt and soft cheese. Lactose is the main carb.  Monosaccharides: Various fruit including figs and mangoes, and sweeteners such as honey and agave nectar. Fructose is the main carb.  Polyols: Certain fruits and vegetables including blackberries and lychee, as well as some low-calorie sweeteners like those in sugar-free gum.   Keep a food diary. This will help you identify foods that cause symptoms. Write down: What you eat and when. What symptoms you have. When symptoms occur in relation to your meals. Avoid foods that cause symptoms. Talk with your dietitian about other ways to get the same nutrients that are in these foods. Eat your meals slowly, in a relaxed  setting. Aim to eat 5-6 small meals per day. Do not skip meals. Drink enough fluids to keep your urine clear or pale yellow. If dairy products cause your symptoms to flare up, try eating less of them. You might be able to handle yogurt better than other dairy products because it contains bacteria that help with digestion.    You will be contacted by [facility] in the next 2 days to arrange a CT Abdomen/Pelvis.  The  number on your caller ID will be [phone], please answer when they call.  If you have not heard from them in 2 days please call [phone] to schedule.     You have been scheduled for a CT scan of the abdomen and pelvis at [facility], 1st floor Radiology. You are scheduled on _______ at ________. You should arrive 15 minutes prior to your appointment time for registration.  We are giving you 2 bottles of contrast today that you will need to drink before arriving for the exam. The solution may taste better if refrigerated so put them in the refrigerator when you get home, but do NOT add ice or any other liquid to this solution as that would dilute it. Shake well before drinking.   Please follow the written instructions below on the day of your exam:   1) Do not eat anything after ________ (4 hours prior to your test)   2) Drink 1 bottle of contrast @ _______ (2 hours prior to your exam)  Remember to shake well before drinking and do NOT pour over ice.     Drink 1 bottle of contrast @ ________ (1 hour prior to your exam)   You may take any medications as prescribed with a small amount of water, if necessary. If you take any of the following medications: METFORMIN, GLUCOPHAGE, GLUCOVANCE, AVANDAMET, RIOMET, FORTAMET, [place] MET, JANUMET, GLUMETZA or METAGLIP, you MAY be asked to HOLD this medication 48 hours AFTER the exam.   The purpose of you drinking the oral contrast is to aid in the visualization of your intestinal tract. The contrast solution may cause some diarrhea. Depending on your individual set of symptoms, you may also receive an intravenous injection of x-ray contrast/dye. Plan on being at [facility] for 45 minutes or longer, depending on the type of exam you are having performed.   If you have any questions regarding your exam or if you need to reschedule, you may call [facility] at [phone] between the hours of 8:00 am and 5:00 pm, Monday-Friday.      Due to recent changes in healthcare laws, you may see the results of your imaging and laboratory studies on MyChart before your provider has had a chance to review them.  We understand that in some cases there may be results that are confusing or concerning to you. Not all laboratory results come back in the same time frame and the provider may be waiting for multiple results in order to interpret others.  Please give [place] 48 hours in order for your provider to thoroughly review all the results before contacting the office for clarification of your results.    I appreciate the  opportunity to care for you  Thank You   [facility]

## 2022-01-19 ENCOUNTER — Telehealth: Payer: Self-pay | Admitting: Hematology and Oncology

## 2022-01-19 NOTE — Telephone Encounter (Signed)
Scheduled appt per 8/8 referral. Pt is aware of appt date and time. Pt is aware to arrive 15 mins prior to appt time and to bring and updated insurance card. Pt is aware of appt location.   

## 2022-01-20 DIAGNOSIS — K5792 Diverticulitis of intestine, part unspecified, without perforation or abscess without bleeding: Secondary | ICD-10-CM | POA: Diagnosis not present

## 2022-01-27 ENCOUNTER — Telehealth: Payer: Self-pay | Admitting: Physician Assistant

## 2022-01-27 NOTE — Telephone Encounter (Signed)
PT is calling to get the date for her appointment to get a CT Scan. I see an order was put in on 01/18/2022 but has not scheduled. Please advise. Thank you.

## 2022-01-27 NOTE — Telephone Encounter (Signed)
Returned patient's call & provided scheduling number.

## 2022-02-03 ENCOUNTER — Ambulatory Visit (HOSPITAL_COMMUNITY)
Admission: RE | Admit: 2022-02-03 | Discharge: 2022-02-03 | Disposition: A | Discharge status: Home / Self Care | Payer: PPO | Source: Ambulatory Visit | Attending: Physician Assistant | Admitting: Physician Assistant

## 2022-02-03 DIAGNOSIS — N281 Cyst of kidney, acquired: Secondary | ICD-10-CM | POA: Diagnosis not present

## 2022-02-03 DIAGNOSIS — R1032 Left lower quadrant pain: Secondary | ICD-10-CM | POA: Diagnosis not present

## 2022-02-03 DIAGNOSIS — K5730 Diverticulosis of large intestine without perforation or abscess without bleeding: Secondary | ICD-10-CM | POA: Diagnosis not present

## 2022-02-03 MED ORDER — IOHEXOL 300 MG/ML  SOLN
100.0000 mL | Freq: Once | INTRAMUSCULAR | Status: AC | PRN
  Administered 2022-02-03: 100 mL via INTRAVENOUS

## 2022-02-04 ENCOUNTER — Ambulatory Visit: Payer: PPO | Admitting: Gastroenterology

## 2022-02-07 ENCOUNTER — Encounter: Payer: Self-pay | Admitting: Pulmonary Disease

## 2022-02-07 ENCOUNTER — Ambulatory Visit: Payer: PPO | Admitting: Pulmonary Disease

## 2022-02-07 VITALS — BP 118/70 | HR 75 | Ht 67.0 in | Wt 207.4 lb

## 2022-02-07 DIAGNOSIS — I2692 Saddle embolus of pulmonary artery without acute cor pulmonale: Secondary | ICD-10-CM | POA: Diagnosis not present

## 2022-02-07 DIAGNOSIS — G4733 Obstructive sleep apnea (adult) (pediatric): Secondary | ICD-10-CM

## 2022-02-07 NOTE — Patient Instructions (Addendum)
Acute bilateral submassive pulmonary embolism Right lower extremity DVT --Concerned for continued anticoagulation due to risk of bleed and falls --CONTINUE Eliquis until seen by Hematology --Encourage regular aerobic activity  Nocturnal hypoxemia 2/2 OSA OSA --ORDER home sleep study  Follow-up with me in 4 months

## 2022-02-07 NOTE — Progress Notes (Unsigned)
Subjective:   PATIENT ID: Stephanie Alvarado GENDER: female DOB: 09-12-1940, MRN: [id number]   HPI  Chief Complaint  Patient presents with   Follow-up    Not using cpap    Reason for Visit: Follow-up  Stephanie Alvarado is a 81 year old female with OSA, submassive pulmonary embolism who presents for follow-up.    Initial consult/Feb 2023 She was recently admitted for diverticulitis.  She was also found with acute bilateral submassive pulmonary embolism and right lower extremity DVT.  Discharge summary for hospitalization from 1/24 to 07/19/2021 was reviewed. She was sent home with oxygen. Since discharge, she has not been ambulatory due to weakness. She is starting to work with Physical Therapy. Before the hospitalization she was previously had balance issues but able to perform ADLs and now she is struggling with sitting up without help. Planning for PT sessions twice a week. She is wearing her oxygen during her sessions. She is compliant with her CPAP nightly.   09/08/21 Since our last visit she has fallen. Reports long standing balance issues. Currently working with PT at home but considering going to a facility based on their recommendations. Occasionally uses oxygen with activity including with therapy. She is compliant with her anticoagulation and reports some bruising with her falls but no other bleeding issues. Denies shortness of breath, chest pain or cough. She is using her CPAP intermittently.   11/11/21 She has completed physical therapy at home. She remains unsteady with walking due to right hammertoes. Tolerating eliquis except for bruising. She is nervous about discontinuing her anticoagulation. She is intermittently wearing her CPAP but planning to be more consistent in the future. Denies shortness of breath, chest pain or cough.  02/07/22 She reports significant anxiety being off her blood thinner. She has bruising. Her last fall was in the spring time and denies any falls since  then. She has never had a blood clot prior to her hospitalization and thought to have been provoked in setting of hospitalization (admitted on 1/24 for diverticulitis, PCCM consulted on 1/30). She is currently not wearing her CPAP and using it every few days.   Social History: Quit smoking in 2000. 40 pack-years.  Past Medical History:  Diagnosis Date   Achilles tendon rupture, right, initial encounter    Allergic rhinitis, cause unspecified    Anemia, unspecified    Anxiety state, unspecified    Atherosclerosis of aorta ([place])    TEE, June, 2010, grade 4 aortic arch atherosclerosis , Dr. Aundra Dubin   CAD (coronary artery disease)    DES and circumflex 2006 /  DES to LAD 2011   Carotid artery disease ([place])    Doppler, November, 2011, stable, 0-39% bilateral, turbulent flow left subclavian   Chronic diastolic heart failure (HCC)    Cyst of thyroid    Diverticulosis of colon (without mention of hemorrhage)    DVT (deep venous thrombosis) (HCC)    in leg   Dysphasia    Possibly esophageal stricture   Esophageal reflux    Hiatal hernia    Hyperlipemia    Hypertension    Lumbago    Lumbar disc disease    MVP (mitral valve prolapse)    Neuropathy    Nonspecific abnormal finding in stool contents    Obesity, unspecified    Osteoarthrosis, unspecified whether generalized or localized, unspecified site    Osteopenia    Other diseases of lung, not elsewhere classified    PE (pulmonary thromboembolism) ([place])    both  lungs   Peripheral vascular disease, unspecified ([place])    Personal history of colonic polyps    ADENOMATOUS POLYP   PFO (patent foramen ovale)    Patient had a very small PFO by TEE 2010.  This was seen only by bubble analysis during Valsalva   PONV (postoperative nausea and vomiting)    Sleep apnea    uses CPAP nightly   Sleep related hypoventilation/hypoxemia in conditions classifiable elsewhere    Stroke [facility]) 11/2008   Treated with TPA? 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 Victorino Dike.   Unspecified cerebral artery occlusion with cerebral infarction    Unspecified venous (peripheral) insufficiency    Vitamin B 12 deficiency      Family History  Problem Relation Age of Onset   Heart disease Father    Heart attack Father    Alzheimer's disease Mother    Colon cancer Paternal Aunt    Breast cancer Other        cousions   Esophageal cancer Neg Hx    Rectal cancer Neg Hx    Stomach cancer Neg Hx      Social History   Occupational History   Occupation: Retired    Fish farm manager: RETIRED  Tobacco Use   Smoking status: Former    Packs/day: 1.00    Years: 40.00    Total pack years: 40.00    Types: Cigarettes    Quit date: 06/13/1998    Years since quitting: 23.6   Smokeless tobacco: Never  Vaping Use   Vaping Use: Never used  Substance and Sexual Activity   Alcohol use: No    Alcohol/week: 0.0 standard drinks of alcohol   Drug use: No   Sexual activity: Not on file    Allergies  Allergen Reactions   Codeine Itching    REACTION: itch     Outpatient Medications Prior to Visit  Medication Sig Dispense Refill   apixaban (ELIQUIS) 5 MG TABS tablet Take 1 tablet (5 mg total) by mouth 2 (two) times daily. 60 tablet 0   buPROPion (WELLBUTRIN XL) 150 MG 24 hr tablet Take 300 mg by mouth daily.     dicyclomine (BENTYL) 20 MG tablet Take 1 tablet (20 mg total) by mouth every 6 (six) hours. 10 tablet 0   docusate sodium (COLACE) 100 MG capsule Take 100 mg by mouth daily as needed for mild constipation.     fluticasone (FLONASE) 50 MCG/ACT nasal spray Place 2 sprays into both nostrils daily. 9.9 mL 1   furosemide (LASIX) 40 MG tablet Take 1 tablet (40 mg total) by mouth daily. 90 tablet 3   gabapentin (NEURONTIN) 300 MG capsule Take 600 mg by mouth 3 (three) times daily.      isosorbide mononitrate (IMDUR) 30 MG 24 hr tablet Take 0.5 tablets (15  mg total) by mouth at bedtime. 30 tablet 1   latanoprost (XALATAN) 0.005 % ophthalmic solution      loratadine (CLARITIN) 10 MG tablet Take 1 tablet (10 mg total) by mouth daily. 30 tablet 1   nitroGLYCERIN (NITROSTAT) 0.4 MG SL tablet Place 0.4 mg under the tongue every 5 (five) minutes as needed for chest pain (TAKE UP TO 3 DOSES BEFORE CALLING 911).     nystatin cream (MYCOSTATIN) Apply 1 Application topically 2 (two) times daily. 30 g 1   OXYGEN Inhale  into the lungs. 2 liters prn     pantoprazole (PROTONIX) 40 MG tablet Take 40 mg by mouth 2 (two) times daily.     PFIZER COVID-19 VAC BIVALENT injection      polyethylene glycol powder (GLYCOLAX/MIRALAX) powder Take 17 g by mouth daily as needed for moderate constipation.      polyvinyl alcohol (LIQUIFILM TEARS) 1.4 % ophthalmic solution Place 1 drop into both eyes as needed for dry eyes.     potassium chloride (KLOR-CON) 10 MEQ tablet Take 1 tablet (10 mEq total) by mouth daily. 90 tablet 3   rosuvastatin (CRESTOR) 10 MG tablet Take 10 mg by mouth at bedtime.     tiZANidine (ZANAFLEX) 4 MG tablet Take by mouth.     No facility-administered medications prior to visit.    Review of Systems  Constitutional:  Negative for chills, diaphoresis, fever, malaise/fatigue and weight loss.  HENT:  Negative for congestion.   Respiratory:  Negative for cough, hemoptysis, sputum production, shortness of breath and wheezing.   Cardiovascular:  Negative for chest pain, palpitations and leg swelling.     Objective:   Vitals:   02/07/22 1442  BP: (!) 154/86  Pulse: 75  SpO2: 94%  Weight: 207 lb 6.4 oz (94.1 kg)  Height: '5\' 7"'$  (1.702 m)   SpO2: 94 % O2 Device: None (Room air)  Physical Exam: General: Well-appearing, no acute distress HENT: [place], AT Eyes: EOMI, no scleral icterus Respiratory: Clear to auscultation bilaterally.  No crackles, wheezing or rales Cardiovascular: RRR, -M/R/G, no JVD Extremities:-Edema,-tenderness Neuro: AAO x4,  CNII-XII grossly intact Psych: Normal mood, normal affect  Data Reviewed:  Imaging: CTA 07/13/21 Acute bilateral pulmonary embolus in lobar branches of RUL, RML, LUE LLL CT Chest 09/06/21 - Resolved right pleural effusion/consolidataion. Emphysema  PFT: None on file  Labs: CBC    Component Value Date/Time   WBC 7.1 01/18/2022 1007   RBC 4.57 01/18/2022 1007   HGB 12.0 01/18/2022 1007   HGB 12.3 04/06/2017 1135   HCT 37.8 01/18/2022 1007   HCT 37.0 04/06/2017 1135   PLT 120.0 (L) 01/18/2022 1007   PLT 190 04/06/2017 1135   MCV 82.7 01/18/2022 1007   MCV 91 04/06/2017 1135   MCH 27.5 07/19/2021 0451   MCHC 31.8 01/18/2022 1007   RDW 21.2 (H) 01/18/2022 1007   RDW 15.6 (H) 04/06/2017 1135   LYMPHSABS 1.6 01/18/2022 1007   LYMPHSABS 1.6 04/06/2017 1135   MONOABS 0.8 01/18/2022 1007   EOSABS 0.2 01/18/2022 1007   EOSABS 0.2 04/06/2017 1135   BASOSABS 0.1 01/18/2022 1007   BASOSABS 0.0 04/06/2017 1135    LE Dopplers: Acute R DVT present  Echo EF 60-65%. Grade I DD. No valvular or WMA    Assessment & Plan:   Discussion: 81 year old female with emphysema, OSA and submassive pulmonary embolism who presents for follow-up. Discussed risks and benefits of longer duration of anticoagulation including bleed in setting of falls and advised for total 6 months maximum.  Acute bilateral submassive pulmonary embolism Right lower extremity DVT --Concerned for continued anticoagulation due to risk of bleed and falls --CONTINUE Eliquis until seen by Hematology --Encourage regular aerobic activity  Nocturnal hypoxemia 2/2 OSA OSA --Last sleep study ~10 years. Current CPAP device is old --Counseled on sleep hygiene --Counseled on weight loss/maintenance of healthy weight --Counseled NOT to drive if/when sleepy --Advised patient to wear CPAP for at least 4 hours each night for greater than 70% of the time to avoid  the machine being repossessed by insurance. --Counseled on weight  loss/maintenance of healthy weight. Consider Inspire device if we we can achieve BMI <32 --ORDER home sleep study  Deconditioning Encourage with Physical Therapy  Health Maintenance Immunization History  Administered Date(s) Administered   Influenza Whole 03/05/2008, 03/06/2009, 05/11/2010   Influenza, High Dose Seasonal PF 03/19/2014, 04/06/2017, 04/02/2018, 02/15/2019   Moderna Sars-Covid-2 Vaccination 08/31/2019   PFIZER(Purple Top)SARS-COV-2 Vaccination 08/10/2019   Pneumococcal Polysaccharide-23 05/11/2010   CT Lung Screen - not qualified   No orders of the defined types were placed in this encounter.  No orders of the defined types were placed in this encounter.   No follow-ups on file.  I have spent a total time of***-minutes on the day of the appointment including chart review, data review, collecting history, coordinating care and discussing medical diagnosis and plan with the patient/family. Past medical history, allergies, medications were reviewed. Pertinent imaging, labs and tests included in this note have been reviewed and interpreted independently by me.  [place], MD [facility] 02/07/2022 3:13 PM  Office Number [phone]

## 2022-02-08 ENCOUNTER — Telehealth: Payer: Self-pay | Admitting: Hematology

## 2022-02-08 NOTE — Telephone Encounter (Signed)
R/s pt's appt time. Pt is aware of new appt time.  

## 2022-02-10 ENCOUNTER — Encounter: Payer: Self-pay | Admitting: Pulmonary Disease

## 2022-02-10 ENCOUNTER — Other Ambulatory Visit: Payer: Self-pay | Admitting: *Deleted

## 2022-02-10 DIAGNOSIS — J449 Chronic obstructive pulmonary disease, unspecified: Secondary | ICD-10-CM | POA: Diagnosis not present

## 2022-02-10 DIAGNOSIS — I10 Essential (primary) hypertension: Secondary | ICD-10-CM | POA: Diagnosis not present

## 2022-02-10 DIAGNOSIS — E782 Mixed hyperlipidemia: Secondary | ICD-10-CM | POA: Diagnosis not present

## 2022-02-10 NOTE — Patient Outreach (Signed)
  Care Coordination   Initial Visit Note   02/10/2022 Name: TEIRA ARCILLA MRN: [id number] DOB: December 10, 1940  ZONNIE LANDEN is a 81 y.o. year old female who sees Caryl Bis, MD for primary care. I spoke with  Bonnielee Haff by phone today.  What matters to the patients health and wellness today?  Patient report she was contacted a couple days ago by [facility] care management to start services with them.  No further needs identified.     SDOH assessments and interventions completed:  No     Care Coordination Interventions Activated:  No  Care Coordination Interventions:  No, not indicated   Follow up plan: No further intervention required.   Encounter Outcome:  Pt. Visit Completed   Valente David, RN, MSN, [place] [facility] Care Management Coordinator [phone]

## 2022-02-11 ENCOUNTER — Inpatient Hospital Stay: Payer: PPO

## 2022-02-11 ENCOUNTER — Inpatient Hospital Stay: Payer: PPO | Admitting: Hematology and Oncology

## 2022-02-11 ENCOUNTER — Inpatient Hospital Stay: Payer: PPO | Attending: Hematology and Oncology | Admitting: Hematology

## 2022-02-11 ENCOUNTER — Telehealth: Payer: Self-pay | Admitting: Hematology

## 2022-02-11 NOTE — Telephone Encounter (Signed)
R/s pt's new hem appt per pt request. Pt is aware of new appt date/time.  

## 2022-02-18 DIAGNOSIS — S81802A Unspecified open wound, left lower leg, initial encounter: Secondary | ICD-10-CM | POA: Diagnosis not present

## 2022-02-18 DIAGNOSIS — S4351XA Sprain of right acromioclavicular joint, initial encounter: Secondary | ICD-10-CM | POA: Diagnosis not present

## 2022-02-18 DIAGNOSIS — S20224A Contusion of middle back wall of thorax, initial encounter: Secondary | ICD-10-CM | POA: Diagnosis not present

## 2022-02-25 ENCOUNTER — Ambulatory Visit: Payer: PPO | Admitting: Physician Assistant

## 2022-03-07 NOTE — Progress Notes (Deleted)
03/07/2022 Stephanie Alvarado [id number] August 30, 1940  Referring provider: Caryl Bis, MD Primary GI doctor: Dr. Henrene Pastor  ASSESSMENT AND PLAN:   Assessment: 81 y.o. female here for assessment of the following: No diagnosis found.  Chronic left lower quadrant pain, recent hospitalization in January for diverticulitis, complicated by PE with massive clot burden, currently on Eliquis. Patient called the office in July with complaints of worsening left lower quadrant pain, Sidden Cipro and Flagyl which she completed, but for the past 2 days worsening left lower quadrant pain again. Patient has chills, pain is worse with movement, denies nausea, vomiting  Patient does walk with a cane, has lower back pain, and also has known pelvic prolapse which could also be contributing to patient's discomfort if CT is negative.  Patient's had a chronic lower abdominal pain for years, most recently just having the diverticulitis.  Last colonoscopy 2013, at this time patient is very high risk for any endoscopic evaluation.    Plan: Will get labs to evaluate for infection. Will get CT abdomen pelvis with contrast, start patient on Augmentin, no allergy or contraindication. Nystatin cream sent in as patient has yeast infection from antibiotics and states this is worked in the past. Add on fiber supplement, avoid NSAIDS, information given Consider following up with GYN for pelvic floor, follow-up with PCP for lower back pain  Unless patient has IDA, rectal bleeding, would not suggest endoscopic evaluation at this time.  No orders of the defined types were placed in this encounter.   No orders of the defined types were placed in this encounter.     History of Present Illness:  81 y.o. female  with a past medical history of anxiety, coronary artery disease status post stent 2011, hypertension, hyperlipidemia, sleep-related hypoventilation with CPAP and intermittent oxygen and others listed below,  returns to clinic today for evaluation of left lower quadrant abdominal pain.  04/2012 colonoscopy 2 flat polyps tubular adenomas, severe diverticulosis descending sigmoid colon 09/15/2017 CT abdomen pelvis showed diverticulosis without diverticulitis, small hiatal hernia lumbar fusions, post cholecystectomy and hysterectomy 06/25/2020 left-sided abdominal pain seen by Alonza Bogus in the office reporting constipation, was hoping to get colonoscopy.  Due to age and comorbidities this was not advised.  07/06/2021 CT abdomen pelvis for right lower quadrant pain concerning for diverticulitis of small bowel in the mid abdomen no abscess or perforation, was there for 2 weeks, during that hospitalization had hypoxia/chest pain with subsequent CTA on  07/12/2021 demonstrated extensive bilateral pulmonary embolus with high clot burden with right heart strain small type I hiatal hernia small right pleural effusion and opacity in right lower lobe. She continues to use O2 intermittently since in the hospital and has SOB with exertion, she is on Eliquis.   08/08 office visit for left lower quadrant pain chronic constipation showed CT abdomen pelvis with no cause for left lower quadrant pain, mild left colon diverticulosis no diverticulitis no hernia.  Lumbar fusion decompression, benign renal cyst on the left, right lobe pulmonary nodule.  Patient had mild thrombocytopenia no leukocytosis no anemia kidney stable liver unremarkable.  She has been on miralax, has been having better stools.  Denies nausea or vomiting.  She has had chills but no fever, thought it was from the eliquis.  No weight loss.  She states she has chronic left lower leg pain, walks with a cane.  She has urinary incontinence and feels heaviness in her vagina, tried pessery with GYN but could not tolerate it.  Does have yeast infection from ABX.   Current Medications:    Current Outpatient Medications (Cardiovascular):    furosemide  (LASIX) 40 MG tablet, Take 1 tablet (40 mg total) by mouth daily.   isosorbide mononitrate (IMDUR) 30 MG 24 hr tablet, Take 0.5 tablets (15 mg total) by mouth at bedtime.   nitroGLYCERIN (NITROSTAT) 0.4 MG SL tablet, Place 0.4 mg under the tongue every 5 (five) minutes as needed for chest pain (TAKE UP TO 3 DOSES BEFORE CALLING 911).   rosuvastatin (CRESTOR) 10 MG tablet, Take 10 mg by mouth at bedtime.  Current Outpatient Medications (Respiratory):    fluticasone (FLONASE) 50 MCG/ACT nasal spray, Place 2 sprays into both nostrils daily.   loratadine (CLARITIN) 10 MG tablet, Take 1 tablet (10 mg total) by mouth daily.   Current Outpatient Medications (Hematological):    apixaban (ELIQUIS) 5 MG TABS tablet, Take 1 tablet (5 mg total) by mouth 2 (two) times daily.  Current Outpatient Medications (Other):    buPROPion (WELLBUTRIN XL) 150 MG 24 hr tablet, Take 300 mg by mouth daily.   dicyclomine (BENTYL) 20 MG tablet, Take 1 tablet (20 mg total) by mouth every 6 (six) hours.   docusate sodium (COLACE) 100 MG capsule, Take 100 mg by mouth daily as needed for mild constipation.   gabapentin (NEURONTIN) 300 MG capsule, Take 600 mg by mouth 3 (three) times daily.    latanoprost (XALATAN) 0.005 % ophthalmic solution,    nystatin cream (MYCOSTATIN), Apply 1 Application topically 2 (two) times daily.   OXYGEN, Inhale into the lungs. 2 liters prn   pantoprazole (PROTONIX) 40 MG tablet, Take 40 mg by mouth 2 (two) times daily.   PFIZER COVID-19 VAC BIVALENT injection,    polyethylene glycol powder (GLYCOLAX/MIRALAX) powder, Take 17 g by mouth daily as needed for moderate constipation.    polyvinyl alcohol (LIQUIFILM TEARS) 1.4 % ophthalmic solution, Place 1 drop into both eyes as needed for dry eyes.   potassium chloride (KLOR-CON) 10 MEQ tablet, Take 1 tablet (10 mEq total) by mouth daily.   tiZANidine (ZANAFLEX) 4 MG tablet, Take by mouth.  Surgical History:  She  has a past surgical history that  includes Coronary angioplasty with stent (3710'G); Laparoscopic hysterectomy; urethral suspension (1993); Lumbar disc surgery; Cholecystectomy; Varicose vein surgery; cataracts; Back surgery; RIGHT/LEFT HEART CATH AND CORONARY ANGIOGRAPHY (N/A, 04/10/2017); Achilles tendon surgery (Right, 04/05/2018); Gastrocnemius Recession (Right, 04/05/2018); Total knee arthroplasty (Right, 01/14/2019); and Total knee arthroplasty (Left, 12/23/2019). Family History:  Her family history includes Alzheimer's disease in her mother; Breast cancer in an other family member; Colon cancer in her paternal aunt; Heart attack in her father; Heart disease in her father. Social History:   reports that she quit smoking about 23 years ago. Her smoking use included cigarettes. She has a 40.00 pack-year smoking history. She has never used smokeless tobacco. She reports that she does not drink alcohol and does not use drugs.  Current Medications, Allergies, Past Medical History, Past Surgical History, Family History and Social History were reviewed in [employer] record.  Physical Exam: There were no vitals taken for this visit. General:   Pleasant, obese female in no acute distress Heart : Regular rate and rhythm; no murmurs Pulm: Diffusely decreased breath sounds Abdomen:  Soft, Obese AB, Active bowel sounds. moderate tenderness in the LLQ. With guarding and Without rebound, No organomegaly appreciated. Rectal: Not evaluated Extremities:  with  edema, mild non pitting, walks with cane.  Neurologic:  Alert and  oriented x4;  No focal deficits.  Psych:  Cooperative. Normal mood and affect.   Vladimir Crofts, PA-C 03/07/22

## 2022-03-09 ENCOUNTER — Telehealth: Payer: Self-pay | Admitting: Physician Assistant

## 2022-03-09 ENCOUNTER — Ambulatory Visit: Payer: PPO | Admitting: Physician Assistant

## 2022-03-09 NOTE — Telephone Encounter (Signed)
Good Morning Stephanie Alvarado,   Patient called stating that she forgot that she has an appointment with you today at 10:00 and that she needed to cancel due to falling and messing up her shoulder.   Patient stated that she will call back later today to reschedule for another day.

## 2022-03-17 DIAGNOSIS — F330 Major depressive disorder, recurrent, mild: Secondary | ICD-10-CM | POA: Diagnosis not present

## 2022-03-21 ENCOUNTER — Inpatient Hospital Stay: Payer: PPO | Attending: Hematology and Oncology | Admitting: Hematology and Oncology

## 2022-03-21 ENCOUNTER — Inpatient Hospital Stay: Payer: PPO

## 2022-03-21 ENCOUNTER — Other Ambulatory Visit: Payer: Self-pay

## 2022-03-21 VITALS — BP 136/72 | HR 74 | Temp 97.7°F | Resp 18 | Wt 204.0 lb

## 2022-03-21 DIAGNOSIS — I2602 Saddle embolus of pulmonary artery with acute cor pulmonale: Secondary | ICD-10-CM

## 2022-03-21 DIAGNOSIS — Z7901 Long term (current) use of anticoagulants: Secondary | ICD-10-CM | POA: Diagnosis not present

## 2022-03-21 DIAGNOSIS — I2699 Other pulmonary embolism without acute cor pulmonale: Secondary | ICD-10-CM | POA: Insufficient documentation

## 2022-03-21 LAB — CBC WITH DIFFERENTIAL (CANCER CENTER ONLY)
Abs Immature Granulocytes: 0.04 10*3/uL (ref 0.00–0.07)
Basophils Absolute: 0.1 10*3/uL (ref 0.0–0.1)
Basophils Relative: 2 %
Eosinophils Absolute: 0.2 10*3/uL (ref 0.0–0.5)
Eosinophils Relative: 4 %
HCT: 38.8 % (ref 36.0–46.0)
Hemoglobin: 11.7 g/dL — ABNORMAL LOW (ref 12.0–15.0)
Immature Granulocytes: 1 %
Lymphocytes Relative: 23 %
Lymphs Abs: 1.1 10*3/uL (ref 0.7–4.0)
MCH: 26.2 pg (ref 26.0–34.0)
MCHC: 30.2 g/dL (ref 30.0–36.0)
MCV: 87 fL (ref 80.0–100.0)
Monocytes Absolute: 0.7 10*3/uL (ref 0.1–1.0)
Monocytes Relative: 15 %
Neutro Abs: 2.7 10*3/uL (ref 1.7–7.7)
Neutrophils Relative %: 55 %
Platelet Count: 130 10*3/uL — ABNORMAL LOW (ref 150–400)
RBC: 4.46 MIL/uL (ref 3.87–5.11)
RDW: 18.5 % — ABNORMAL HIGH (ref 11.5–15.5)
Smear Review: NORMAL
WBC Count: 4.9 10*3/uL (ref 4.0–10.5)
nRBC: 0 % (ref 0.0–0.2)

## 2022-03-21 LAB — CMP (CANCER CENTER ONLY)
ALT: 7 U/L (ref 0–44)
AST: 12 U/L — ABNORMAL LOW (ref 15–41)
Albumin: 3.8 g/dL (ref 3.5–5.0)
Alkaline Phosphatase: 78 U/L (ref 38–126)
Anion gap: 2 — ABNORMAL LOW (ref 5–15)
BUN: 15 mg/dL (ref 8–23)
CO2: 32 mmol/L (ref 22–32)
Calcium: 9.9 mg/dL (ref 8.9–10.3)
Chloride: 105 mmol/L (ref 98–111)
Creatinine: 0.91 mg/dL (ref 0.44–1.00)
GFR, Estimated: >60 mL/min (ref >60)
Glucose, Bld: 112 mg/dL — ABNORMAL HIGH (ref 70–99)
Potassium: 4.9 mmol/L (ref 3.5–5.1)
Sodium: 139 mmol/L (ref 135–145)
Total Bilirubin: 0.6 mg/dL (ref 0.3–1.2)
Total Protein: 6.8 g/dL (ref 6.5–8.1)

## 2022-03-21 MED ORDER — APIXABAN 2.5 MG PO TABS: 2.5000 mg | ORAL_TABLET | Freq: Two times a day (BID) | ORAL | 0 refills | Status: DC

## 2022-03-21 NOTE — Progress Notes (Signed)
[place] [phone]   Fax:([phone] [place] NOTE  Patient Care Team: Caryl Bis, MD as PCP - General (Unknown Physician Specialty) Sherren Mocha, MD as PCP - Cardiology (Cardiology)  Hematological/Oncological History # Bilateral Pulmonary Emboli # Lower Extremity DVT 07/12/2021: CT PE study showed extensive bilateral pulmonary embolus. Clot burden is high. Positive for acute PE with CT evidence of right heart strain 07/13/2021: Lower Extremity Doppler showed findings consistent with acute deep vein thrombosis involving the right posterior tibial veins 03/21/2022: establish care with Dr. Lorenso Courier   CHIEF COMPLAINTS/PURPOSE OF CONSULTATION:  "Bilateral Pulmonary Emboli "  HISTORY OF PRESENTING ILLNESS:  Stephanie Alvarado 81 y.o. female with medical history significant for anxiety, CAD status post drug-eluting stent to the circumflex artery in 2006 and LAD in 2011, diverticulosis, stroke, and osteoarthritis, who presents for evaluation of pulmonary emboli/lower extremity DVT.  On review of the previous records [name] was initially admitted to the hospital with acute diverticulitis on 07/06/2021.  During the course of her hospitalization she began developing chest pressure and underwent CT PE study on 07/12/2021 which showed extensive bilateral pulmonary emboli with heavy clot burden.  A lower extremity Doppler the next day showed acute deep vein thrombosis of the right posterior tibial veins.  Patient was started on anticoagulation therapy and had a prolonged hospitalization until 07/19/2021.  Patient was followed by pulmonology and due to the provoked nature of the VTE recommended discontinuation of anticoagulation.  Patient was concerned about this and therefore a consult to hematology was placed for further evaluation and management.  On exam today [name] reports that she feels well overall.  She does have baseline shortness of breath and  uses oxygen at home as needed.  She is currently on 2 L of oxygen.  She notes that she has not been having any difficulty with chest pressure or lower extremity pain, swelling, or erythema.  She notes that this is the first time she has ever had any issues with blood clots.  She is tolerating anticoagulation well with no bleeding, bruising, or dark stools.  The cost of medication is $30 for a 62-monthsupply.  On further discussion her family history is remarkable for a mother with Alzheimer's disease, father with heart disease.  Her son also had a heart attack at age 83  She notes that her maternal grandmother also had cardiac issues.  Patient is a former smoker having quit 23 years ago.  She is a nondrinker.  She previously worked at the [employer]  She notes that she otherwise feels well with no fevers, chills, sweats, nausea, vomiting or diarrhea.  Full 10 point ROS is listed below.  MEDICAL HISTORY:  Past Medical History:  Diagnosis Date   Achilles tendon rupture, right, initial encounter    Allergic rhinitis, cause unspecified    Anemia, unspecified    Anxiety state, unspecified    Atherosclerosis of aorta ([place]    TEE, June, 2010, grade 4 aortic arch atherosclerosis , Dr. MAundra Dubin  CAD (coronary artery disease)    DES and circumflex 2006 /  DES to LAD 2011   Carotid artery disease ([facility]    Doppler, November, 2011, stable, 0-39% bilateral, turbulent flow left subclavian   Chronic diastolic heart failure (HCC)    Cyst of thyroid    Diverticulosis of colon (without mention of hemorrhage)    DVT (deep venous thrombosis) ([place]    in leg   Dysphasia  Possibly esophageal stricture   Esophageal reflux    Hiatal hernia    Hyperlipemia    Hypertension    Lumbago    Lumbar disc disease    MVP (mitral valve prolapse)    Neuropathy    Nonspecific abnormal finding in stool contents    Obesity, unspecified    Osteoarthrosis, unspecified whether generalized or localized,  unspecified site    Osteopenia    Other diseases of lung, not elsewhere classified    PE (pulmonary thromboembolism) ([place])    both lungs   Peripheral vascular disease, unspecified ([place])    Personal history of colonic polyps    ADENOMATOUS POLYP   PFO (patent foramen ovale)    Patient had a very small PFO by TEE 2010.  This was seen only by bubble analysis during Valsalva   PONV (postoperative nausea and vomiting)    Sleep apnea    uses CPAP nightly   Sleep related hypoventilation/hypoxemia in conditions classifiable elsewhere    Stroke [facility]) 11/2008   Treated with TPA? 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 Victorino Dike.   Unspecified cerebral artery occlusion with cerebral infarction    Unspecified venous (peripheral) insufficiency    Vitamin B 12 deficiency     SURGICAL HISTORY: Past Surgical History:  Procedure Laterality Date   ACHILLES TENDON SURGERY Right 04/05/2018   Procedure: Right achilles tendon reconstruction;  Surgeon: Wylene Simmer, MD;  Location: [place];  Service: Orthopedics;  Laterality: Right;  33mn   BACK SURGERY     cataracts     both eyes   CHOLECYSTECTOMY     CORONARY ANGIOPLASTY WITH STENT PLACEMENT  1990's   GASTROCNEMIUS RECESSION Right 04/05/2018   Procedure: Gastroc recession;  Surgeon: HWylene Simmer MD;  [place]  Service: Orthopedics;  Laterality: Right;   LAPAROSCOPIC HYSTERECTOMY     LUMBAR DISC SURGERY     X 3   RIGHT/LEFT HEART CATH AND CORONARY ANGIOGRAPHY N/A 04/10/2017   Procedure: RIGHT/LEFT HEART CATH AND CORONARY ANGIOGRAPHY;  Surgeon: SBelva Crome MD;  [facility];  Service: Cardiovascular;  Laterality: N/A;   TOTAL KNEE ARTHROPLASTY Right 01/14/2019   Procedure: TOTAL KNEE ARTHROPLASTY;  Surgeon: AGaynelle Arabian MD;  [facility];  Service: Orthopedics;  Laterality:  Right;  586m   TOTAL KNEE ARTHROPLASTY Left 12/23/2019   Procedure: TOTAL KNEE ARTHROPLASTY;  Surgeon: AlGaynelle ArabianMD;  Location: [facility];  Service: Orthopedics;  Laterality: Left;  5035m  urethral suspension  1993   Dr. NeaNori RiisVARICOSE VEIN SURGERY     x 2    SOCIAL HISTORY: Social History   Socioeconomic History   Marital status: Widowed    Spouse name: Not on file   Number of children: 4   Years of education: Not on file   Highest education level: Not on file  Occupational History   Occupation: Retired    [employer] farm manager[employer]  Tobacco Use   Smoking status: Former    Packs/day: 1.00    Years: 40.00    Total pack years: 40.00    Types: Cigarettes    Quit date: 06/13/1998    Years since quitting: 23.7   Smokeless tobacco: Never  Vaping Use   Vaping Use: Never used  Substance and Sexual Activity   Alcohol use: No  Alcohol/week: 0.0 standard drinks of alcohol   Drug use: No   Sexual activity: Not on file  Other Topics Concern   Not on file  Social History Narrative   Not on file   Social Determinants of Health   Financial Resource Strain: Not on file  Food Insecurity: Not on file  Transportation Needs: Not on file  Physical Activity: Not on file  Stress: Not on file  Social Connections: Not on file  Intimate Partner Violence: Not on file    FAMILY HISTORY: Family History  Problem Relation Age of Onset   Heart disease Father    Heart attack Father    Alzheimer's disease Mother    Colon cancer Paternal Aunt    Breast cancer Other        cousions   Esophageal cancer Neg Hx    Rectal cancer Neg Hx    Stomach cancer Neg Hx     ALLERGIES:  is allergic to codeine.  MEDICATIONS:  Current Outpatient Medications  Medication Sig Dispense Refill   apixaban (ELIQUIS) 2.5 MG TABS tablet Take 1 tablet (2.5 mg total) by mouth 2 (two) times daily. 180 tablet 0   ALLERGY RELIEF CETIRIZINE 10 MG tablet Take 10 mg by mouth at bedtime.     buPROPion (WELLBUTRIN XL)  150 MG 24 hr tablet Take 300 mg by mouth daily. (Patient not taking: Reported on 03/21/2022)     dicyclomine (BENTYL) 20 MG tablet Take 1 tablet (20 mg total) by mouth every 6 (six) hours. (Patient not taking: Reported on 03/21/2022) 10 tablet 0   furosemide (LASIX) 40 MG tablet Take 1 tablet (40 mg total) by mouth daily. 90 tablet 3   gabapentin (NEURONTIN) 300 MG capsule Take 600 mg by mouth 3 (three) times daily.      isosorbide mononitrate (IMDUR) 30 MG 24 hr tablet Take 0.5 tablets (15 mg total) by mouth at bedtime. 30 tablet 1   latanoprost (XALATAN) 0.005 % ophthalmic solution      Melatonin 10 MG TABS 10 mg by oral route.     nitroGLYCERIN (NITROSTAT) 0.4 MG SL tablet Place 0.4 mg under the tongue every 5 (five) minutes as needed for chest pain (TAKE UP TO 3 DOSES BEFORE CALLING 911).     nystatin cream (MYCOSTATIN) Apply 1 Application topically 2 (two) times daily. 30 g 1   OXYGEN Inhale into the lungs. 2 liters prn     pantoprazole (PROTONIX) 40 MG tablet Take 40 mg by mouth 2 (two) times daily.     polyethylene glycol powder (GLYCOLAX/MIRALAX) powder Take 17 g by mouth daily as needed for moderate constipation.      polyvinyl alcohol (LIQUIFILM TEARS) 1.4 % ophthalmic solution Place 1 drop into both eyes as needed for dry eyes.     potassium chloride (KLOR-CON) 10 MEQ tablet Take 1 tablet (10 mEq total) by mouth daily. 90 tablet 3   rosuvastatin (CRESTOR) 10 MG tablet Take 10 mg by mouth at bedtime.     tiZANidine (ZANAFLEX) 4 MG tablet Take by mouth.     No current facility-administered medications for this visit.    REVIEW OF SYSTEMS:   Constitutional: ( - ) fevers, ( - )  chills , ( - ) night sweats Eyes: ( - ) blurriness of vision, ( - ) double vision, ( - ) watery eyes Ears, nose, mouth, throat, and face: ( - ) mucositis, ( - ) sore throat Respiratory: ( - ) cough, ( - )  dyspnea, ( - ) wheezes Cardiovascular: ( - ) palpitation, ( - ) chest discomfort, ( - ) lower extremity  swelling Gastrointestinal:  ( - ) nausea, ( - ) heartburn, ( - ) change in bowel habits Skin: ( - ) abnormal skin rashes Lymphatics: ( - ) new lymphadenopathy, ( - ) easy bruising Neurological: ( - ) numbness, ( - ) tingling, ( - ) new weaknesses Behavioral/Psych: ( - ) mood change, ( - ) new changes  All other systems were reviewed with the patient and are negative.  PHYSICAL EXAMINATION:  Vitals:   03/21/22 1324  BP: 136/72  Pulse: 74  Resp: 18  Temp: 97.7 F (36.5 C)  SpO2: 98%   Filed Weights   03/21/22 1324  Weight: 204 lb (92.5 kg)    GENERAL: well appearing elderly Caucasian female in NAD  SKIN: skin color, texture, turgor are normal, no rashes or significant lesions EYES: conjunctiva are pink and non-injected, sclera clear LUNGS: clear to auscultation and percussion with normal breathing effort HEART: regular rate & rhythm and no murmurs and no lower extremity edema Musculoskeletal: no cyanosis of digits and no clubbing  PSYCH: alert & oriented x 3, fluent speech NEURO: no focal motor/sensory deficits  LABORATORY DATA:  I have reviewed the data as listed    Latest Ref Rng & Units 01/18/2022   10:07 AM 07/19/2021    4:51 AM 07/18/2021    5:40 AM  CBC  WBC 4.0 - 10.5 K/uL 7.1  9.3  8.2   Hemoglobin 12.0 - 15.0 g/dL 12.0  11.7  11.6   Hematocrit 36.0 - 46.0 % 37.8  38.7  37.8   Platelets 150.0 - 400.0 K/uL 120.0  177  171        Latest Ref Rng & Units 01/18/2022   10:07 AM 07/19/2021    4:51 AM 07/18/2021    5:40 AM  CMP  Glucose 70 - 99 mg/dL 107  121  87   BUN 6 - 23 mg/dL '14  19  20   '$ Creatinine 0.40 - 1.20 mg/dL 0.93  0.85  1.00   Sodium 135 - 145 mEq/L 140  138  136   Potassium 3.5 - 5.1 mEq/L 4.6  4.5  4.2   Chloride 96 - 112 mEq/L 103  103  99   CO2 19 - 32 mEq/L '31  29  28   '$ Calcium 8.4 - 10.5 mg/dL 10.0  9.7  9.6   Total Protein 6.0 - 8.3 g/dL 6.8     Total Bilirubin 0.2 - 1.2 mg/dL 0.5     Alkaline Phos 39 - 117 U/L 76     AST 0 - 37 U/L 12     ALT  0 - 35 U/L 7        ASSESSMENT & PLAN TAKIYA BELMARES 81 y.o. female with medical history significant for anxiety, CAD status post drug-eluting stent to the circumflex artery in 2006 and LAD in 2011, diverticulosis, stroke, and osteoarthritis, who presents for evaluation of pulmonary emboli/lower extremity DVT.  After review of the labs, review of the records, and discussion with the patient the patients findings are most consistent with bilateral pulmonary emboli and right lower extremity DVT provoked by diverticulitis and hospitalization.  A provoked venous thromboembolism (VTE) is one that has a clear inciting factor or event. Provoking factors include prolonged travel/immobility, surgery (particularly abdominal or orthopedic), trauma,  and pregnancy/ estrogen containing birth control. This patient was reported to have  diverticulitis and prolonged hospital stay, which would qualify as a transient provoking factor. As such we would recommend 3-6 months of anticoagulation therapy with consideration of additional therapy if symptoms persist. The anticoagulation therapy of choice in this situation is continued Eliquis therapy. The patient has a supply of this medication and can afford it without difficulty. We will plan to see the patient back in 3 months time to reassess and assure they are doing well on treatment.   #Provoked DVT/Pulmonary Emboli  --findings at this time are consistent with a provoked VTE  --will order baseline CMP and CBC to assure labs are adequate for DOAC therapy  --Due to the pruritic nature of this VTE patient only requires 6 months of anticoagulation therapy.  Patient would like to continue anticoagulation and given her poor mobility and baseline lung dysfunction I think this would be reasonable to continue for at least 12 months and then discuss discontinuation at her next visit. --recommend the patient decrease to maintenance dose eliquis 2.5 mg BID --patient denies any  bleeding, bruising, or dark stools on this medication. It is well tolerated. No difficulties accessing/affording the medication  --RTC in 3 months' time with strict return precautions for overt signs of bleeding.   Orders Placed This Encounter  Procedures   CBC with Differential ([place] Only)    Standing Status:   Future    Number of Occurrences:   1    Standing Expiration Date:   03/22/2023   CMP ([place] only)    Standing Status:   Future    Number of Occurrences:   1    Standing Expiration Date:   03/22/2023    All questions were answered. The patient knows to call the clinic with any problems, questions or concerns.  A total of more than 60 minutes were spent on this encounter with face-to-face time and non-face-to-face time, including preparing to see the patient, ordering tests and/or medications, counseling the patient and coordination of care as outlined above.   Ledell Peoples, MD Department of Hematology/Oncology [place] [facility] Phone: [phone] Pager: [phone] Email: [email or web address]  03/21/2022 2:47 PM

## 2022-03-25 DIAGNOSIS — H35033 Hypertensive retinopathy, bilateral: Secondary | ICD-10-CM | POA: Diagnosis not present

## 2022-03-25 DIAGNOSIS — H16223 Keratoconjunctivitis sicca, not specified as Sjogren's, bilateral: Secondary | ICD-10-CM | POA: Diagnosis not present

## 2022-03-25 DIAGNOSIS — H348312 Tributary (branch) retinal vein occlusion, right eye, stable: Secondary | ICD-10-CM | POA: Diagnosis not present

## 2022-03-25 DIAGNOSIS — H40023 Open angle with borderline findings, high risk, bilateral: Secondary | ICD-10-CM | POA: Diagnosis not present

## 2022-03-25 DIAGNOSIS — H52223 Regular astigmatism, bilateral: Secondary | ICD-10-CM | POA: Diagnosis not present

## 2022-03-25 DIAGNOSIS — H35363 Drusen (degenerative) of macula, bilateral: Secondary | ICD-10-CM | POA: Diagnosis not present

## 2022-03-25 DIAGNOSIS — H524 Presbyopia: Secondary | ICD-10-CM | POA: Diagnosis not present

## 2022-04-29 DIAGNOSIS — W19XXXD Unspecified fall, subsequent encounter: Secondary | ICD-10-CM | POA: Diagnosis not present

## 2022-04-29 DIAGNOSIS — S4991XD Unspecified injury of right shoulder and upper arm, subsequent encounter: Secondary | ICD-10-CM | POA: Diagnosis not present

## 2022-05-12 DIAGNOSIS — E782 Mixed hyperlipidemia: Secondary | ICD-10-CM | POA: Diagnosis not present

## 2022-05-12 DIAGNOSIS — I10 Essential (primary) hypertension: Secondary | ICD-10-CM | POA: Diagnosis not present

## 2022-05-12 DIAGNOSIS — J449 Chronic obstructive pulmonary disease, unspecified: Secondary | ICD-10-CM | POA: Diagnosis not present

## 2022-05-16 DIAGNOSIS — E785 Hyperlipidemia, unspecified: Secondary | ICD-10-CM | POA: Diagnosis present

## 2022-05-16 DIAGNOSIS — N3941 Urge incontinence: Secondary | ICD-10-CM | POA: Diagnosis not present

## 2022-05-17 DIAGNOSIS — Z1231 Encounter for screening mammogram for malignant neoplasm of breast: Secondary | ICD-10-CM | POA: Diagnosis not present

## 2022-05-17 DIAGNOSIS — R32 Unspecified urinary incontinence: Secondary | ICD-10-CM | POA: Diagnosis not present

## 2022-05-17 DIAGNOSIS — Z8262 Family history of osteoporosis: Secondary | ICD-10-CM | POA: Diagnosis not present

## 2022-05-17 DIAGNOSIS — Z01419 Encounter for gynecological examination (general) (routine) without abnormal findings: Secondary | ICD-10-CM | POA: Diagnosis not present

## 2022-05-17 DIAGNOSIS — N958 Other specified menopausal and perimenopausal disorders: Secondary | ICD-10-CM | POA: Diagnosis not present

## 2022-05-17 DIAGNOSIS — M8588 Other specified disorders of bone density and structure, other site: Secondary | ICD-10-CM | POA: Diagnosis not present

## 2022-05-17 DIAGNOSIS — Z6831 Body mass index (BMI) 31.0-31.9, adult: Secondary | ICD-10-CM | POA: Diagnosis not present

## 2022-05-17 DIAGNOSIS — Z780 Asymptomatic menopausal state: Secondary | ICD-10-CM | POA: Diagnosis not present

## 2022-06-02 DIAGNOSIS — K219 Gastro-esophageal reflux disease without esophagitis: Secondary | ICD-10-CM | POA: Diagnosis not present

## 2022-06-02 DIAGNOSIS — N183 Chronic kidney disease, stage 3 unspecified: Secondary | ICD-10-CM | POA: Diagnosis not present

## 2022-06-02 DIAGNOSIS — I10 Essential (primary) hypertension: Secondary | ICD-10-CM | POA: Diagnosis not present

## 2022-06-02 DIAGNOSIS — J449 Chronic obstructive pulmonary disease, unspecified: Secondary | ICD-10-CM | POA: Diagnosis not present

## 2022-06-02 DIAGNOSIS — E7849 Other hyperlipidemia: Secondary | ICD-10-CM | POA: Diagnosis not present

## 2022-06-02 DIAGNOSIS — E039 Hypothyroidism, unspecified: Secondary | ICD-10-CM | POA: Diagnosis not present

## 2022-06-02 DIAGNOSIS — G4733 Obstructive sleep apnea (adult) (pediatric): Secondary | ICD-10-CM | POA: Diagnosis not present

## 2022-06-02 DIAGNOSIS — I25111 Atherosclerotic heart disease of native coronary artery with angina pectoris with documented spasm: Secondary | ICD-10-CM | POA: Diagnosis not present

## 2022-06-02 DIAGNOSIS — Z0001 Encounter for general adult medical examination with abnormal findings: Secondary | ICD-10-CM | POA: Diagnosis not present

## 2022-06-02 DIAGNOSIS — I5032 Chronic diastolic (congestive) heart failure: Secondary | ICD-10-CM | POA: Diagnosis not present

## 2022-06-07 DIAGNOSIS — I10 Essential (primary) hypertension: Secondary | ICD-10-CM | POA: Diagnosis not present

## 2022-06-07 DIAGNOSIS — M25511 Pain in right shoulder: Secondary | ICD-10-CM | POA: Diagnosis not present

## 2022-06-07 DIAGNOSIS — Z0001 Encounter for general adult medical examination with abnormal findings: Secondary | ICD-10-CM | POA: Diagnosis not present

## 2022-06-07 DIAGNOSIS — J9611 Chronic respiratory failure with hypoxia: Secondary | ICD-10-CM | POA: Diagnosis not present

## 2022-06-07 DIAGNOSIS — I25111 Atherosclerotic heart disease of native coronary artery with angina pectoris with documented spasm: Secondary | ICD-10-CM | POA: Diagnosis not present

## 2022-06-07 DIAGNOSIS — N1832 Chronic kidney disease, stage 3b: Secondary | ICD-10-CM | POA: Diagnosis not present

## 2022-06-07 DIAGNOSIS — E7849 Other hyperlipidemia: Secondary | ICD-10-CM | POA: Diagnosis not present

## 2022-06-07 DIAGNOSIS — H6121 Impacted cerumen, right ear: Secondary | ICD-10-CM | POA: Diagnosis not present

## 2022-06-07 DIAGNOSIS — G319 Degenerative disease of nervous system, unspecified: Secondary | ICD-10-CM | POA: Diagnosis not present

## 2022-06-07 DIAGNOSIS — I700 Atherosclerosis of aorta: Secondary | ICD-10-CM | POA: Diagnosis not present

## 2022-06-07 DIAGNOSIS — R270 Ataxia, unspecified: Secondary | ICD-10-CM | POA: Diagnosis not present

## 2022-06-07 DIAGNOSIS — I8391 Asymptomatic varicose veins of right lower extremity: Secondary | ICD-10-CM | POA: Diagnosis not present

## 2022-06-10 DIAGNOSIS — S46011A Strain of muscle(s) and tendon(s) of the rotator cuff of right shoulder, initial encounter: Secondary | ICD-10-CM | POA: Diagnosis not present

## 2022-06-11 ENCOUNTER — Emergency Department (HOSPITAL_COMMUNITY): Payer: PPO

## 2022-06-11 ENCOUNTER — Encounter (HOSPITAL_COMMUNITY): Payer: Self-pay | Admitting: Emergency Medicine

## 2022-06-11 ENCOUNTER — Other Ambulatory Visit: Payer: Self-pay

## 2022-06-11 ENCOUNTER — Emergency Department (HOSPITAL_COMMUNITY)
Admission: EM | Admit: 2022-06-11 | Discharge: 2022-06-12 | Disposition: A | Discharge status: Home / Self Care | Payer: PPO | Attending: Emergency Medicine | Admitting: Emergency Medicine

## 2022-06-11 DIAGNOSIS — Y9301 Activity, walking, marching and hiking: Secondary | ICD-10-CM | POA: Diagnosis not present

## 2022-06-11 DIAGNOSIS — W19XXXA Unspecified fall, initial encounter: Secondary | ICD-10-CM

## 2022-06-11 DIAGNOSIS — I5032 Chronic diastolic (congestive) heart failure: Secondary | ICD-10-CM | POA: Diagnosis not present

## 2022-06-11 DIAGNOSIS — Y998 Other external cause status: Secondary | ICD-10-CM | POA: Diagnosis not present

## 2022-06-11 DIAGNOSIS — I2510 Atherosclerotic heart disease of native coronary artery without angina pectoris: Secondary | ICD-10-CM | POA: Insufficient documentation

## 2022-06-11 DIAGNOSIS — R519 Headache, unspecified: Secondary | ICD-10-CM | POA: Diagnosis not present

## 2022-06-11 DIAGNOSIS — W01198A Fall on same level from slipping, tripping and stumbling with subsequent striking against other object, initial encounter: Secondary | ICD-10-CM | POA: Diagnosis not present

## 2022-06-11 DIAGNOSIS — I10 Essential (primary) hypertension: Secondary | ICD-10-CM | POA: Diagnosis not present

## 2022-06-11 DIAGNOSIS — S0990XA Unspecified injury of head, initial encounter: Secondary | ICD-10-CM | POA: Diagnosis not present

## 2022-06-11 DIAGNOSIS — M542 Cervicalgia: Secondary | ICD-10-CM | POA: Diagnosis not present

## 2022-06-11 DIAGNOSIS — Y92511 Restaurant or cafe as the place of occurrence of the external cause: Secondary | ICD-10-CM | POA: Diagnosis not present

## 2022-06-11 NOTE — ED Triage Notes (Signed)
Patient presents due to a fall this evening. While coming out of a restaurant, the patient stepped pf a curb and fell backward, hitting her head on the cement. She is currently taking Eliquis.

## 2022-06-12 NOTE — ED Provider Notes (Signed)
[facility] DEPT Provider Note  CSN: [id number] Arrival date & time: 06/11/22 2047  Chief Complaint(s) Fall and Headache  HPI KATIRA DUMAIS is a 81 y.o. 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  Patient reports that she was walking out of a restaurant and stepped down from a curb causing her to lose her balance and she fell backwards.  She hit her head but did not lose consciousness.  Ended up driving herself home and presenting a few hours afterward.  She denies any current headache.  Endorses mild left-sided neck pain in triage which has currently subsided.  She denied any other physical complaints including back pain, chest pain, abdominal pain, hip pain or other extremity pain.  The history is provided by the patient.  Headache   Past Medical History Past Medical History:  Diagnosis Date   Achilles tendon rupture, right, initial encounter    Allergic rhinitis, cause unspecified    Anemia, unspecified    Anxiety state, unspecified    Atherosclerosis of aorta ([place])    TEE, June, 2010, grade 4 aortic arch atherosclerosis , Dr. Aundra Dubin   CAD (coronary artery disease)    DES and circumflex 2006 /  DES to LAD 2011   Carotid artery disease ([place])    Doppler, November, 2011, stable, 0-39% bilateral, turbulent flow left subclavian   Chronic diastolic heart failure (HCC)    Cyst of thyroid    Diverticulosis of colon (without mention of hemorrhage)    DVT (deep venous thrombosis) ([place])    in leg   Dysphasia    Possibly esophageal stricture   Esophageal reflux    Hiatal hernia    Hyperlipemia    Hypertension    Lumbago    Lumbar disc disease    MVP (mitral valve prolapse)    Neuropathy    Nonspecific abnormal finding in stool contents    Obesity, unspecified    Osteoarthrosis, unspecified whether generalized or  localized, unspecified site    Osteopenia    Other diseases of lung, not elsewhere classified    PE (pulmonary thromboembolism) ([place])    both lungs   Peripheral vascular disease, unspecified ([place])    Personal history of colonic polyps    ADENOMATOUS POLYP   PFO (patent foramen ovale)    Patient had a very small PFO by TEE 2010.  This was seen only by bubble analysis during Valsalva   PONV (postoperative nausea and vomiting)    Sleep apnea    uses CPAP nightly   Sleep related hypoventilation/hypoxemia in conditions classifiable elsewhere    Stroke [facility]) 11/2008   Treated with TPA? 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 Victorino Dike.   Unspecified cerebral artery occlusion with cerebral infarction    Unspecified venous (peripheral) insufficiency    Vitamin B 12 deficiency    Patient Active Problem List   Diagnosis Date Noted   Nocturnal hypoxemia 11/11/2021   Opacity of lung on imaging study 07/13/2021   Acute saddle pulmonary embolism ([place]) 07/12/2021   Acute on chronic respiratory failure with hypoxia ([place]) 07/12/2021   Chest pain 07/11/2021   Daytime sleepiness 07/10/2021   AKI (acute kidney injury) ([place]) 07/08/2021   Chronic diastolic CHF (congestive heart failure) ([place])  07/07/2021   Thrombocytopenia ([place]) 07/07/2021   Chronic respiratory failure with hypoxia, on home oxygen therapy ([place]) 07/07/2021   Hypokalemia 07/07/2021   Primary osteoarthritis of left knee 12/23/2019   Diarrhea 09/06/2017   Chronic RLQ pain 05/26/2017   Lightheadedness    Ataxia 05/30/2016   Dizziness 05/30/2016   Preoperative clearance 11/10/2015   Varicose veins of left lower extremity with complications 13/24/4010   Varicose veins of leg with complications 27/25/3664   Lumbar scoliosis 11/21/2013   Preop cardiovascular exam 10/29/2013   LLQ abdominal pain 04/05/2013   Diverticulitis  04/05/2013   Palpitations    Subclavian artery disease (HCC)    PFO (patent foramen ovale)    CAD (coronary artery disease)    Ejection fraction    Atherosclerosis of aorta (HCC)    Carotid artery disease ([place])    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   Chronic constipation 06/11/2010   ABDOMINAL PAIN-LLQ 06/11/2010   History of adenomatous polyp of colon 06/11/2010   OSA (obstructive sleep apnea) 07/17/2009   Stroke ([place]) 11/11/2008   COLONIC POLYPS 08/31/2008   Thyroid nodule 08/31/2008   VENOUS INSUFFICIENCY 08/31/2008   Diverticulosis of colon 08/31/2008   LOW BACK PAIN, CHRONIC 08/31/2008   FIBROMYALGIA 08/31/2008   Dyspnea 08/08/2008   HYPERCHOLESTEROLEMIA 04/19/2007   OBESITY 04/19/2007   ANEMIA 04/19/2007   ANXIETY 04/19/2007   Essential hypertension 04/19/2007   ALLERGIC RHINITIS 04/19/2007   PULMONARY NODULE, RIGHT LOWER LOBE 04/19/2007   GERD 04/19/2007   OA (osteoarthritis) of knee 04/19/2007   Home Medication(s) Prior to Admission medications   Medication Sig Start Date End Date Taking? Authorizing Provider  ALLERGY RELIEF CETIRIZINE 10 MG tablet Take 10 mg by mouth at bedtime. 02/14/22  Yes [provider]  apixaban (ELIQUIS) 2.5 MG TABS tablet Take 1 tablet (2.5 mg total) by mouth 2 (two) times daily. 03/21/22  Yes Orson Slick, MD  buPROPion (WELLBUTRIN XL) 150 MG 24 hr tablet Take 300 mg by mouth in the morning. 10/31/15  Yes [provider]  furosemide (LASIX) 40 MG tablet Take 1 tablet (40 mg total) by mouth daily. Patient taking differently: Take 40 mg by mouth 2 (two) times a week. 09/09/21  Yes Sherren Mocha, MD  gabapentin (NEURONTIN) 300 MG capsule Take 600 mg by mouth 3 (three) times daily.  10/31/15  Yes [provider]  isosorbide mononitrate (IMDUR) 30 MG 24 hr tablet Take 0.5 tablets (15 mg total) by mouth at bedtime. 07/19/21  Yes Eugenie Filler, MD  latanoprost (XALATAN) 0.005 % ophthalmic solution Place 1 drop  into both eyes at bedtime. 08/04/21  Yes [provider]  metoprolol tartrate (LOPRESSOR) 25 MG tablet Take 25 mg by mouth in the morning and at bedtime.   Yes [provider]  nitroGLYCERIN (NITROSTAT) 0.4 MG SL tablet Place 0.4 mg under the tongue every 5 (five) minutes as needed for chest pain (TAKE UP TO 3 DOSES BEFORE CALLING 911).   Yes [provider]  nystatin cream (MYCOSTATIN) Apply 1 Application topically 2 (two) times daily. Patient taking differently: Apply 1 Application topically 2 (two) times daily as needed (for itching under the breasts). 01/18/22  Yes Vladimir Crofts, PA-C  OXYGEN Inhale 2 L/min into the lungs as needed (for shortness of breath).   Yes [provider]  pantoprazole (PROTONIX) 40 MG tablet Take 40 mg by mouth 2 (two) times daily before a meal.   Yes [provider]  polyethylene glycol  powder (GLYCOLAX/MIRALAX) powder Take 17 g by mouth daily as needed for moderate constipation.    Yes Sable Feil, MD  potassium chloride (KLOR-CON) 10 MEQ tablet Take 1 tablet (10 mEq total) by mouth daily. 09/09/21  Yes Sherren Mocha, MD  PRESCRIPTION MEDICATION See admin instructions. CPAP- At bedtime   Yes [provider]  rosuvastatin (CRESTOR) 10 MG tablet Take 10 mg by mouth at bedtime.   Yes [provider]  tiZANidine (ZANAFLEX) 4 MG tablet Take 4 mg by mouth daily as needed for muscle spasms. 08/09/21  Yes [provider]  TYLENOL 500 MG tablet Take 500-1,500 mg by mouth 2 (two) times daily as needed for mild pain or headache.   Yes [provider]  dicyclomine (BENTYL) 20 MG tablet Take 1 tablet (20 mg total) by mouth every 6 (six) hours. Patient not taking: Reported on 03/21/2022 01/18/22   Vladimir Crofts, PA-C                                                                                                                                    Allergies Codeine  Review of Systems Review  of Systems  Neurological:  Positive for headaches.   As noted in HPI  Physical Exam Vital Signs  I have reviewed the triage vital signs BP (!) 164/64   Pulse 77   Temp 98.2 F (36.8 C)   Resp 18   SpO2 95%   Physical Exam Constitutional:      General: She is not in acute distress.    Appearance: She is well-developed. She is not diaphoretic.  HENT:     Head: Normocephalic and atraumatic.     Right Ear: External ear normal.     Left Ear: External ear normal.     Nose: Nose normal.  Eyes:     General: No scleral icterus.       Right eye: No discharge.        Left eye: No discharge.     Conjunctiva/sclera: Conjunctivae normal.     Pupils: Pupils are equal, round, and reactive to light.  Cardiovascular:     Rate and Rhythm: Normal rate and regular rhythm.     Pulses:          Radial pulses are 2+ on the right side and 2+ on the left side.       Dorsalis pedis pulses are 2+ on the right side and 2+ on the left side.     Heart sounds: Normal heart sounds. No murmur heard.    No friction rub. No gallop.  Pulmonary:     Effort: Pulmonary effort is normal. No respiratory distress.     Breath sounds: Normal breath sounds. No stridor. No wheezing.  Abdominal:     General: There is no distension.     Palpations: Abdomen is soft.     Tenderness: There is no abdominal tenderness.  Musculoskeletal:        General: No tenderness.     Cervical back: Normal range of motion and neck supple. No bony tenderness.     Thoracic back: No bony tenderness.     Lumbar back: No bony tenderness.     Comments: Clavicles stable. Chest stable to AP/Lat compression. Pelvis stable to Lat compression. No obvious extremity deformity. No chest or abdominal wall contusion.  Skin:    General: Skin is warm and dry.     Findings: No erythema or rash.  Neurological:     Mental Status: She is alert and oriented to person, place, and time.     Comments: Moving all extremities     ED Results and  Treatments Labs (all labs ordered are listed, but only abnormal results are displayed) Labs Reviewed - No data to display                                                                                                                       EKG  EKG Interpretation  Date/Time:    Ventricular Rate:    PR Interval:    QRS Duration:   QT Interval:    QTC Calculation:   R Axis:     Text Interpretation:         Radiology CT Head Wo Contrast  Result Date: 06/11/2022 CLINICAL DATA:  Recent fall with headaches and neck pain, initial encounter EXAM: CT HEAD WITHOUT CONTRAST CT CERVICAL SPINE WITHOUT CONTRAST TECHNIQUE: Multidetector CT imaging of the head and cervical spine was performed following the standard protocol without intravenous contrast. Multiplanar CT image reconstructions of the cervical spine were also generated. RADIATION DOSE REDUCTION: This exam was performed according to the departmental dose-optimization program which includes automated exposure control, adjustment of the mA and/or kV according to patient size and/or use of iterative reconstruction technique. COMPARISON:  05/30/2016 FINDINGS: CT HEAD FINDINGS Brain: No evidence of acute infarction, hemorrhage, hydrocephalus, extra-axial collection or mass lesion/mass effect. Chronic atrophic and ischemic changes are noted. Vascular: No hyperdense vessel or unexpected calcification. Skull: Normal. Negative for fracture or focal lesion. Sinuses/Orbits: No acute finding. Other: None. CT CERVICAL SPINE FINDINGS Alignment: Mild anterolisthesis of C2 on C3 is noted of a degenerative nature. Skull base and vertebrae: 7 cervical segments are well visualized. Multilevel disc space narrowing and osteophytic changes are seen. Facet hypertrophic changes are noted. No acute fracture or acute facet abnormality is noted. The odontoid is within normal limits. Soft tissues and spinal canal: Surrounding soft tissue structures show a large 3.3 cm  hypodense right thyroid nodule which has been previously biopsied in 2013. Upper chest: Visualized lung apices are within normal limits. Other: None IMPRESSION: CT of the head: Chronic atrophic and ischemic changes without acute abnormality. CT of the cervical spine: Multilevel degenerative change without acute abnormality. 3.3 cm hypodense right thyroid nodule which has been previously biopsied in 2013. Correlate with prior pathology. Stability for greater than 5 years implies benignity; no biopsy  or followup indicated (ref: J Am Coll Radiol. 2015 Feb;12(2): 143-50). Electronically Signed   By: Inez Catalina M.D.   On: 06/11/2022 23:08   CT Cervical Spine Wo Contrast  Result Date: 06/11/2022 CLINICAL DATA:  Recent fall with headaches and neck pain, initial encounter EXAM: CT HEAD WITHOUT CONTRAST CT CERVICAL SPINE WITHOUT CONTRAST TECHNIQUE: Multidetector CT imaging of the head and cervical spine was performed following the standard protocol without intravenous contrast. Multiplanar CT image reconstructions of the cervical spine were also generated. RADIATION DOSE REDUCTION: This exam was performed according to the departmental dose-optimization program which includes automated exposure control, adjustment of the mA and/or kV according to patient size and/or use of iterative reconstruction technique. COMPARISON:  05/30/2016 FINDINGS: CT HEAD FINDINGS Brain: No evidence of acute infarction, hemorrhage, hydrocephalus, extra-axial collection or mass lesion/mass effect. Chronic atrophic and ischemic changes are noted. Vascular: No hyperdense vessel or unexpected calcification. Skull: Normal. Negative for fracture or focal lesion. Sinuses/Orbits: No acute finding. Other: None. CT CERVICAL SPINE FINDINGS Alignment: Mild anterolisthesis of C2 on C3 is noted of a degenerative nature. Skull base and vertebrae: 7 cervical segments are well visualized. Multilevel disc space narrowing and osteophytic changes are seen.  Facet hypertrophic changes are noted. No acute fracture or acute facet abnormality is noted. The odontoid is within normal limits. Soft tissues and spinal canal: Surrounding soft tissue structures show a large 3.3 cm hypodense right thyroid nodule which has been previously biopsied in 2013. Upper chest: Visualized lung apices are within normal limits. Other: None IMPRESSION: CT of the head: Chronic atrophic and ischemic changes without acute abnormality. CT of the cervical spine: Multilevel degenerative change without acute abnormality. 3.3 cm hypodense right thyroid nodule which has been previously biopsied in 2013. Correlate with prior pathology. Stability for greater than 5 years implies benignity; no biopsy or followup indicated (ref: J Am Coll Radiol. 2015 Feb;12(2): 143-50). Electronically Signed   By: Inez Catalina M.D.   On: 06/11/2022 23:08    Medications Ordered in ED Medications - No data to display                                                                                                                                   Procedures Procedures  (including critical care time)  Medical Decision Making / ED Course   Medical Decision Making Amount and/or Complexity of Data Reviewed Radiology: ordered and independent interpretation performed. Decision-making details documented in ED Course.    Mechanical fall resulting in head trauma in a patient who is anticoagulated No signs of severe trauma noted on exam. CT head and cervical spine negative for any ICH or acute injuries.      Final Clinical Impression(s) / ED Diagnoses Final diagnoses:  Fall, initial encounter  Minor head trauma   The patient appears reasonably screened and/or stabilized for discharge and I doubt any other medical condition or other [facility] requiring further screening,  evaluation, or treatment in the ED at this time. I have discussed the findings, Dx and Tx plan with the patient/family who expressed  understanding and agree(s) with the plan. Discharge instructions discussed at length. The patient/family was given strict return precautions who verbalized understanding of the instructions. No further questions at time of discharge.  Disposition: Discharge  Condition: Good  ED Discharge Orders     None        Follow Up: Caryl Bis, MD [place] [id number] [phone]  Call  as needed           This chart was dictated using voice recognition software.  Despite best efforts to proofread,  errors can occur which can change the documentation meaning.    Fatima Blank, MD 06/12/22 0010

## 2022-06-22 ENCOUNTER — Other Ambulatory Visit: Payer: Self-pay | Admitting: Hematology and Oncology

## 2022-06-22 ENCOUNTER — Inpatient Hospital Stay: Payer: PPO | Attending: Hematology and Oncology

## 2022-06-22 ENCOUNTER — Inpatient Hospital Stay (HOSPITAL_BASED_OUTPATIENT_CLINIC_OR_DEPARTMENT_OTHER): Payer: PPO | Admitting: Hematology and Oncology

## 2022-06-22 ENCOUNTER — Other Ambulatory Visit: Payer: Self-pay

## 2022-06-22 VITALS — BP 173/79 | HR 65 | Temp 97.7°F | Resp 16 | Ht 67.0 in | Wt 194.5 lb

## 2022-06-22 DIAGNOSIS — I2602 Saddle embolus of pulmonary artery with acute cor pulmonale: Secondary | ICD-10-CM | POA: Diagnosis not present

## 2022-06-22 DIAGNOSIS — Z7901 Long term (current) use of anticoagulants: Secondary | ICD-10-CM | POA: Diagnosis not present

## 2022-06-22 DIAGNOSIS — I2699 Other pulmonary embolism without acute cor pulmonale: Secondary | ICD-10-CM | POA: Diagnosis not present

## 2022-06-22 DIAGNOSIS — I82441 Acute embolism and thrombosis of right tibial vein: Secondary | ICD-10-CM | POA: Insufficient documentation

## 2022-06-22 LAB — CBC WITH DIFFERENTIAL (CANCER CENTER ONLY)
Abs Immature Granulocytes: 0.06 10*3/uL (ref 0.00–0.07)
Basophils Absolute: 0.1 10*3/uL (ref 0.0–0.1)
Basophils Relative: 2 %
Eosinophils Absolute: 0.1 10*3/uL (ref 0.0–0.5)
Eosinophils Relative: 2 %
HCT: 41.4 % (ref 36.0–46.0)
Hemoglobin: 12.6 g/dL (ref 12.0–15.0)
Immature Granulocytes: 1 %
Lymphocytes Relative: 21 %
Lymphs Abs: 1.2 10*3/uL (ref 0.7–4.0)
MCH: 25.5 pg — ABNORMAL LOW (ref 26.0–34.0)
MCHC: 30.4 g/dL (ref 30.0–36.0)
MCV: 83.6 fL (ref 80.0–100.0)
Monocytes Absolute: 0.7 10*3/uL (ref 0.1–1.0)
Monocytes Relative: 11 %
Neutro Abs: 3.8 10*3/uL (ref 1.7–7.7)
Neutrophils Relative %: 63 %
Platelet Count: 125 10*3/uL — ABNORMAL LOW (ref 150–400)
RBC: 4.95 MIL/uL (ref 3.87–5.11)
RDW: 19.4 % — ABNORMAL HIGH (ref 11.5–15.5)
WBC Count: 5.9 10*3/uL (ref 4.0–10.5)
nRBC: 0 % (ref 0.0–0.2)

## 2022-06-22 LAB — CMP (CANCER CENTER ONLY)
ALT: 5 U/L (ref 0–44)
AST: 12 U/L — ABNORMAL LOW (ref 15–41)
Albumin: 4.1 g/dL (ref 3.5–5.0)
Alkaline Phosphatase: 84 U/L (ref 38–126)
Anion gap: 3 — ABNORMAL LOW (ref 5–15)
BUN: 19 mg/dL (ref 8–23)
CO2: 32 mmol/L (ref 22–32)
Calcium: 10.1 mg/dL (ref 8.9–10.3)
Chloride: 106 mmol/L (ref 98–111)
Creatinine: 1.01 mg/dL — ABNORMAL HIGH (ref 0.44–1.00)
GFR, Estimated: 55 mL/min — ABNORMAL LOW (ref >60)
Glucose, Bld: 85 mg/dL (ref 70–99)
Potassium: 4.3 mmol/L (ref 3.5–5.1)
Sodium: 141 mmol/L (ref 135–145)
Total Bilirubin: 0.6 mg/dL (ref 0.3–1.2)
Total Protein: 7 g/dL (ref 6.5–8.1)

## 2022-06-22 MED ORDER — APIXABAN 2.5 MG PO TABS: 2.5000 mg | ORAL_TABLET | Freq: Two times a day (BID) | ORAL | 1 refills | Status: DC

## 2022-06-22 NOTE — Progress Notes (Signed)
[place] [phone]   [phone]  PROGRESS NOTE  Patient Care Team: Caryl Bis, MD as PCP - General (Unknown Physician Specialty) Sherren Mocha, MD as PCP - Cardiology (Cardiology)  Hematological/Oncological History # Bilateral Pulmonary Emboli # Lower Extremity DVT 07/12/2021: CT PE study showed extensive bilateral pulmonary embolus. Clot burden is high. Positive for acute PE with CT evidence of right heart strain 07/13/2021: Lower Extremity Doppler showed findings consistent with acute deep vein thrombosis involving the right posterior tibial veins 03/21/2022: establish care with Dr. Lorenso Courier   Interval History:  Stephanie Alvarado 82 y.o. female with medical history significant for bilateral PE and DVT who presents for a follow up visit. The patient's last visit was on 03/21/2022. In the interim since the last visit she has continued on eliquis therapy.  On exam today Stephanie Alvarado notes that she continues taking her Eliquis 2.5 mg twice daily.  She did unfortunately run out of her current supply last Thursday.  She did try to get a refill but did not call our office.  She notes she also did recently have a fall but fortunately did not have any bleeding.  She notes she is not otherwise having any bleeding, bruising, or dark stools.  She notes her energy levels are quite low and she has to walk with a walker.  She also uses a cane continuously at home.  Her balance has been poor.  She has had no signs or symptoms concerning for recurrent VTE such as leg swelling, leg pain, shortness of breath, or chest pain.  She is also stopped started any new medications.  She otherwise denies any fevers, chills, sweats, nausea, vomiting or diarrhea.  A full 10 point ROS was otherwise negative.  MEDICAL HISTORY:  Past Medical History:  Diagnosis Date   Achilles tendon rupture, right, initial encounter    Allergic rhinitis, cause unspecified    Anemia, unspecified     Anxiety state, unspecified    Atherosclerosis of aorta ([place])    TEE, June, 2010, grade 4 aortic arch atherosclerosis , Dr. Aundra Dubin   CAD (coronary artery disease)    DES and circumflex 2006 /  DES to LAD 2011   Carotid artery disease ([place])    Doppler, November, 2011, stable, 0-39% bilateral, turbulent flow left subclavian   Chronic diastolic heart failure (H[facility])    Cyst of thyroid    Diverticulosis of colon (without mention of hemorrhage)    DVT (deep venous thrombosis) ([place])    in leg   Dysphasia    Possibly esophageal stricture   Esophageal reflux    Hiatal hernia    Hyperlipemia    Hypertension    Lumbago    Lumbar disc disease    MVP (mitral valve prolapse)    Neuropathy    Nonspecific abnormal finding in stool contents    Obesity, unspecified    Osteoarthrosis, unspecified whether generalized or localized, unspecified site    Osteopenia    Other diseases of lung, not elsewhere classified    PE (pulmonary thromboembolism) ([place])    both lungs   Peripheral vascular disease, unspecified ([place])    Personal history of colonic polyps    ADENOMATOUS POLYP   PFO (patent foramen ovale)    Patient had a very small PFO by TEE 2010.  This was seen only by bubble analysis during Valsalva   PONV (postoperative nausea and vomiting)    Sleep apnea    uses CPAP nightly  Sleep related hypoventilation/hypoxemia in conditions classifiable elsewhere    Stroke [facility]) 11/2008   Treated with TPA? 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 Victorino Dike.   Unspecified cerebral artery occlusion with cerebral infarction    Unspecified venous (peripheral) insufficiency    Vitamin B 12 deficiency     SURGICAL HISTORY: Past Surgical History:  Procedure Laterality Date   ACHILLES TENDON SURGERY Right 04/05/2018   Procedure: Right achilles tendon reconstruction;  Surgeon: Wylene Simmer, MD;  Location:  [place];  Service: Orthopedics;  Laterality: Right;  70mn   BACK SURGERY     cataracts     both eyes   CHOLECYSTECTOMY     CORONARY ANGIOPLASTY WITH STENT PLACEMENT  1990's   GASTROCNEMIUS RECESSION Right 04/05/2018   Procedure: Gastroc recession;  Surgeon: HWylene Simmer MD;  [place]  Service: Orthopedics;  Laterality: Right;   LAPAROSCOPIC HYSTERECTOMY     LUMBAR DISC SURGERY     X 3   RIGHT/LEFT HEART CATH AND CORONARY ANGIOGRAPHY N/A 04/10/2017   Procedure: RIGHT/LEFT HEART CATH AND CORONARY ANGIOGRAPHY;  Surgeon: SBelva Crome MD;  [facility];  Service: Cardiovascular;  Laterality: N/A;   TOTAL KNEE ARTHROPLASTY Right 01/14/2019   Procedure: TOTAL KNEE ARTHROPLASTY;  Surgeon: AGaynelle Arabian MD;  Location: [facility];  Service: Orthopedics;  Laterality: Right;  526m   TOTAL KNEE ARTHROPLASTY Left 12/23/2019   Procedure: TOTAL KNEE ARTHROPLASTY;  Surgeon: AlGaynelle ArabianMD;  Location: [facility];  Service: Orthopedics;  Laterality: Left;  5074m  urethral suspension  1993   Dr. NeaNori RiisVARICOSE VEIN SURGERY     x 2    SOCIAL HISTORY: Social History   Socioeconomic History   Marital status: Widowed    Spouse name: Not on file   Number of children: 4   Years of education: Not on file   Highest education level: Not on file  Occupational History   Occupation: Retired    EmpFish farm managerETIRED  Tobacco Use   Smoking status: Former    Packs/day: 1.00    Years: 40.00    Total pack years: 40.00    Types: Cigarettes    Quit date: 06/13/1998    Years since quitting: 24.0   Smokeless tobacco: Never  Vaping Use   Vaping Use: Never used  Substance and Sexual Activity   Alcohol use: No    Alcohol/week: 0.0 standard drinks of alcohol   Drug use: No   Sexual activity: Not on file  Other Topics Concern   Not on file  Social History Narrative   Not on file   Social Determinants of Health   Financial Resource Strain: Not on  file  Food Insecurity: Not on file  Transportation Needs: Not on file  Physical Activity: Not on file  Stress: Not on file  Social Connections: Not on file  Intimate Partner Violence: Not on file    FAMILY HISTORY: Family History  Problem Relation Age of Onset   Heart disease Father    Heart attack Father    Alzheimer's disease Mother    Colon cancer Paternal Aunt    Breast cancer Other        cousions   Esophageal cancer Neg Hx    Rectal cancer Neg Hx    Stomach cancer Neg Hx  ALLERGIES:  is allergic to codeine.  MEDICATIONS:  Current Outpatient Medications  Medication Sig Dispense Refill   ALLERGY RELIEF CETIRIZINE 10 MG tablet Take 10 mg by mouth at bedtime.     buPROPion (WELLBUTRIN XL) 150 MG 24 hr tablet Take 300 mg by mouth in the morning.     dicyclomine (BENTYL) 20 MG tablet Take 1 tablet (20 mg total) by mouth every 6 (six) hours. 10 tablet 0   furosemide (LASIX) 40 MG tablet Take 1 tablet (40 mg total) by mouth daily. (Patient taking differently: Take 40 mg by mouth 2 (two) times a week.) 90 tablet 3   gabapentin (NEURONTIN) 300 MG capsule Take 600 mg by mouth 3 (three) times daily.      isosorbide mononitrate (IMDUR) 30 MG 24 hr tablet Take 0.5 tablets (15 mg total) by mouth at bedtime. 30 tablet 1   latanoprost (XALATAN) 0.005 % ophthalmic solution Place 1 drop into both eyes at bedtime.     metoprolol tartrate (LOPRESSOR) 25 MG tablet Take 25 mg by mouth in the morning and at bedtime.     nitroGLYCERIN (NITROSTAT) 0.4 MG SL tablet Place 0.4 mg under the tongue every 5 (five) minutes as needed for chest pain (TAKE UP TO 3 DOSES BEFORE CALLING 911).     nystatin cream (MYCOSTATIN) Apply 1 Application topically 2 (two) times daily. (Patient taking differently: Apply 1 Application topically 2 (two) times daily as needed (for itching under the breasts).) 30 g 1   OXYGEN Inhale 2 L/min into the lungs as needed (for shortness of breath).     pantoprazole (PROTONIX) 40  MG tablet Take 40 mg by mouth 2 (two) times daily before a meal.     polyethylene glycol powder (GLYCOLAX/MIRALAX) powder Take 17 g by mouth daily as needed for moderate constipation.      potassium chloride (KLOR-CON) 10 MEQ tablet Take 1 tablet (10 mEq total) by mouth daily. 90 tablet 3   PRESCRIPTION MEDICATION See admin instructions. CPAP- At bedtime     rosuvastatin (CRESTOR) 10 MG tablet Take 10 mg by mouth at bedtime.     tiZANidine (ZANAFLEX) 4 MG tablet Take 4 mg by mouth daily as needed for muscle spasms.     TYLENOL 500 MG tablet Take 500-1,500 mg by mouth 2 (two) times daily as needed for mild pain or headache.     apixaban (ELIQUIS) 2.5 MG TABS tablet Take 1 tablet (2.5 mg total) by mouth 2 (two) times daily. 180 tablet 1   No current facility-administered medications for this visit.    REVIEW OF SYSTEMS:   Constitutional: ( - ) fevers, ( - )  chills , ( - ) night sweats Eyes: ( - ) blurriness of vision, ( - ) double vision, ( - ) watery eyes Ears, nose, mouth, throat, and face: ( - ) mucositis, ( - ) sore throat Respiratory: ( - ) cough, ( - ) dyspnea, ( - ) wheezes Cardiovascular: ( - ) palpitation, ( - ) chest discomfort, ( - ) lower extremity swelling Gastrointestinal:  ( - ) nausea, ( - ) heartburn, ( - ) change in bowel habits Skin: ( - ) abnormal skin rashes Lymphatics: ( - ) new lymphadenopathy, ( - ) easy bruising Neurological: ( - ) numbness, ( - ) tingling, ( - ) new weaknesses Behavioral/Psych: ( - ) mood change, ( - ) new changes  All other systems were reviewed with the patient and are negative.  PHYSICAL EXAMINATION:  Vitals:   06/22/22 1050  BP: (!) 173/79  Pulse: 65  Resp: 16  Temp: 97.7 F (36.5 C)  SpO2: 95%   Filed Weights   06/22/22 1050  Weight: 194 lb 8 oz (88.2 kg)    GENERAL: chronically ill-appearing elderly Caucasian female, alert, no distress and comfortable SKIN: skin color, texture, turgor are normal, no rashes or significant  lesions EYES: conjunctiva are pink and non-injected, sclera clear LUNGS: clear to auscultation and percussion with normal breathing effort HEART: regular rate & rhythm and no murmurs and no lower extremity edema Musculoskeletal: no cyanosis of digits and no clubbing  PSYCH: alert & oriented x 3, fluent speech NEURO: no focal motor/sensory deficits  LABORATORY DATA:  I have reviewed the data as listed    Latest Ref Rng & Units 06/22/2022   10:25 AM 03/21/2022    2:33 PM 01/18/2022   10:07 AM  CBC  WBC 4.0 - 10.5 K/uL 5.9  4.9  7.1   Hemoglobin 12.0 - 15.0 g/dL 12.6  11.7  12.0   Hematocrit 36.0 - 46.0 % 41.4  38.8  37.8   Platelets 150 - 400 K/uL 125  130  120.0        Latest Ref Rng & Units 06/22/2022   10:25 AM 03/21/2022    2:33 PM 01/18/2022   10:07 AM  CMP  Glucose 70 - 99 mg/dL 85  112  107   BUN 8 - 23 mg/dL '19  15  14   '$ Creatinine 0.44 - 1.00 mg/dL 1.01  0.91  0.93   Sodium 135 - 145 mmol/L 141  139  140   Potassium 3.5 - 5.1 mmol/L 4.3  4.9  4.6   Chloride 98 - 111 mmol/L 106  105  103   CO2 22 - 32 mmol/L 32  32  31   Calcium 8.9 - 10.3 mg/dL 10.1  9.9  10.0   Total Protein 6.5 - 8.1 g/dL 7.0  6.8  6.8   Total Bilirubin 0.3 - 1.2 mg/dL 0.6  0.6  0.5   Alkaline Phos 38 - 126 U/L 84  78  76   AST 15 - 41 U/L '12  12  12   '$ ALT 0 - 44 U/L '5  7  7     '$ RADIOGRAPHIC STUDIES: CT Head Wo Contrast  Result Date: 06/11/2022 CLINICAL DATA:  Recent fall with headaches and neck pain, initial encounter EXAM: CT HEAD WITHOUT CONTRAST CT CERVICAL SPINE WITHOUT CONTRAST TECHNIQUE: Multidetector CT imaging of the head and cervical spine was performed following the standard protocol without intravenous contrast. Multiplanar CT image reconstructions of the cervical spine were also generated. RADIATION DOSE REDUCTION: This exam was performed according to the departmental dose-optimization program which includes automated exposure control, adjustment of the mA and/or kV according to patient  size and/or use of iterative reconstruction technique. COMPARISON:  05/30/2016 FINDINGS: CT HEAD FINDINGS Brain: No evidence of acute infarction, hemorrhage, hydrocephalus, extra-axial collection or mass lesion/mass effect. Chronic atrophic and ischemic changes are noted. Vascular: No hyperdense vessel or unexpected calcification. Skull: Normal. Negative for fracture or focal lesion. Sinuses/Orbits: No acute finding. Other: None. CT CERVICAL SPINE FINDINGS Alignment: Mild anterolisthesis of C2 on C3 is noted of a degenerative nature. Skull base and vertebrae: 7 cervical segments are well visualized. Multilevel disc space narrowing and osteophytic changes are seen. Facet hypertrophic changes are noted. No acute fracture or acute facet abnormality is noted. The odontoid is within normal limits. Soft tissues  and spinal canal: Surrounding soft tissue structures show a large 3.3 cm hypodense right thyroid nodule which has been previously biopsied in 2013. Upper chest: Visualized lung apices are within normal limits. Other: None IMPRESSION: CT of the head: Chronic atrophic and ischemic changes without acute abnormality. CT of the cervical spine: Multilevel degenerative change without acute abnormality. 3.3 cm hypodense right thyroid nodule which has been previously biopsied in 2013. Correlate with prior pathology. Stability for greater than 5 years implies benignity; no biopsy or followup indicated (ref: J Am Coll Radiol. 2015 Feb;12(2): 143-50). Electronically Signed   By: Inez Catalina M.D.   On: 06/11/2022 23:08   CT Cervical Spine Wo Contrast  Result Date: 06/11/2022 CLINICAL DATA:  Recent fall with headaches and neck pain, initial encounter EXAM: CT HEAD WITHOUT CONTRAST CT CERVICAL SPINE WITHOUT CONTRAST TECHNIQUE: Multidetector CT imaging of the head and cervical spine was performed following the standard protocol without intravenous contrast. Multiplanar CT image reconstructions of the cervical spine were also  generated. RADIATION DOSE REDUCTION: This exam was performed according to the departmental dose-optimization program which includes automated exposure control, adjustment of the mA and/or kV according to patient size and/or use of iterative reconstruction technique. COMPARISON:  05/30/2016 FINDINGS: CT HEAD FINDINGS Brain: No evidence of acute infarction, hemorrhage, hydrocephalus, extra-axial collection or mass lesion/mass effect. Chronic atrophic and ischemic changes are noted. Vascular: No hyperdense vessel or unexpected calcification. Skull: Normal. Negative for fracture or focal lesion. Sinuses/Orbits: No acute finding. Other: None. CT CERVICAL SPINE FINDINGS Alignment: Mild anterolisthesis of C2 on C3 is noted of a degenerative nature. Skull base and vertebrae: 7 cervical segments are well visualized. Multilevel disc space narrowing and osteophytic changes are seen. Facet hypertrophic changes are noted. No acute fracture or acute facet abnormality is noted. The odontoid is within normal limits. Soft tissues and spinal canal: Surrounding soft tissue structures show a large 3.3 cm hypodense right thyroid nodule which has been previously biopsied in 2013. Upper chest: Visualized lung apices are within normal limits. Other: None IMPRESSION: CT of the head: Chronic atrophic and ischemic changes without acute abnormality. CT of the cervical spine: Multilevel degenerative change without acute abnormality. 3.3 cm hypodense right thyroid nodule which has been previously biopsied in 2013. Correlate with prior pathology. Stability for greater than 5 years implies benignity; no biopsy or followup indicated (ref: J Am Coll Radiol. 2015 Feb;12(2): 143-50). Electronically Signed   By: Inez Catalina M.D.   On: 06/11/2022 23:08    ASSESSMENT & PLAN Stephanie Alvarado 82 y.o. female with medical history significant for bilateral PE and DVT who presents for a follow up visit.   After review of the labs, review of the records, and  discussion with the patient the patients findings are most consistent with bilateral pulmonary emboli and right lower extremity DVT provoked by diverticulitis and hospitalization.   A provoked venous thromboembolism (VTE) is one that has a clear inciting factor or event. Provoking factors include prolonged travel/immobility, surgery (particularly abdominal or orthopedic), trauma,  and pregnancy/ estrogen containing birth control. This patient was reported to have diverticulitis and prolonged hospital stay, which would qualify as a transient provoking factor. As such we would recommend 3-6 months of anticoagulation therapy with consideration of additional therapy if symptoms persist. The anticoagulation therapy of choice in this situation is continued Eliquis therapy. The patient has a supply of this medication and can afford it without difficulty. We will plan to see the patient back in 3 months time  to reassess and assure they are doing well on treatment.    #Provoked DVT/Pulmonary Emboli  --findings at this time are consistent with a provoked VTE  --will order baseline CMP and CBC to assure labs are adequate for DOAC therapy  --Due to the provoked nature of this VTE patient only requires 6 months of anticoagulation therapy.  Patient would like to continue anticoagulation and given her poor mobility and baseline lung dysfunction I think this would be reasonable to continue  --recommend the patient continue maintenance dose eliquis 2.5 mg BID (she could discontinue the medication per guidelines recommendations but prefers to continue. --patient denies any bleeding, bruising, or dark stools on this medication. It is well tolerated. No difficulties accessing/affording the medication  --Labs show white blood cell count 5.9, hemoglobin 12.6, MCV 83.6, and platelets of 125. --RTC in 6 months' time with strict return precautions for overt signs of bleeding  No orders of the defined types were placed in this  encounter.   All questions were answered. The patient knows to call the clinic with any problems, questions or concerns.  A total of more than 30 minutes were spent on this encounter with face-to-face time and non-face-to-face time, including preparing to see the patient, ordering tests and/or medications, counseling the patient and coordination of care as outlined above.   Ledell Peoples, MD Department of Hematology/Oncology [place] [facility] Phone: [phone] Pager: [phone] Email: [email or web address]  06/22/2022 4:58 PM

## 2022-07-31 ENCOUNTER — Emergency Department (HOSPITAL_COMMUNITY): Payer: PPO

## 2022-07-31 ENCOUNTER — Emergency Department (HOSPITAL_COMMUNITY)
Admission: EM | Admit: 2022-07-31 | Discharge: 2022-07-31 | Disposition: A | Discharge status: Home / Self Care | Payer: PPO | Attending: Emergency Medicine | Admitting: Emergency Medicine

## 2022-07-31 ENCOUNTER — Encounter (HOSPITAL_COMMUNITY): Payer: Self-pay | Admitting: Emergency Medicine

## 2022-07-31 ENCOUNTER — Other Ambulatory Visit: Payer: Self-pay

## 2022-07-31 DIAGNOSIS — I2510 Atherosclerotic heart disease of native coronary artery without angina pectoris: Secondary | ICD-10-CM | POA: Insufficient documentation

## 2022-07-31 DIAGNOSIS — Z7901 Long term (current) use of anticoagulants: Secondary | ICD-10-CM | POA: Diagnosis not present

## 2022-07-31 DIAGNOSIS — Y92511 Restaurant or cafe as the place of occurrence of the external cause: Secondary | ICD-10-CM | POA: Insufficient documentation

## 2022-07-31 DIAGNOSIS — Z8673 Personal history of transient ischemic attack (TIA), and cerebral infarction without residual deficits: Secondary | ICD-10-CM | POA: Insufficient documentation

## 2022-07-31 DIAGNOSIS — W19XXXA Unspecified fall, initial encounter: Secondary | ICD-10-CM

## 2022-07-31 DIAGNOSIS — M47812 Spondylosis without myelopathy or radiculopathy, cervical region: Secondary | ICD-10-CM | POA: Diagnosis not present

## 2022-07-31 DIAGNOSIS — I110 Hypertensive heart disease with heart failure: Secondary | ICD-10-CM | POA: Insufficient documentation

## 2022-07-31 DIAGNOSIS — M25552 Pain in left hip: Secondary | ICD-10-CM | POA: Diagnosis not present

## 2022-07-31 DIAGNOSIS — Z79899 Other long term (current) drug therapy: Secondary | ICD-10-CM | POA: Diagnosis not present

## 2022-07-31 DIAGNOSIS — M542 Cervicalgia: Secondary | ICD-10-CM | POA: Diagnosis not present

## 2022-07-31 DIAGNOSIS — S0990XA Unspecified injury of head, initial encounter: Secondary | ICD-10-CM | POA: Diagnosis not present

## 2022-07-31 DIAGNOSIS — M25551 Pain in right hip: Secondary | ICD-10-CM | POA: Diagnosis not present

## 2022-07-31 DIAGNOSIS — I5030 Unspecified diastolic (congestive) heart failure: Secondary | ICD-10-CM | POA: Diagnosis not present

## 2022-07-31 DIAGNOSIS — M25511 Pain in right shoulder: Secondary | ICD-10-CM | POA: Diagnosis not present

## 2022-07-31 DIAGNOSIS — M810 Age-related osteoporosis without current pathological fracture: Secondary | ICD-10-CM | POA: Diagnosis not present

## 2022-07-31 DIAGNOSIS — W0110XA Fall on same level from slipping, tripping and stumbling with subsequent striking against unspecified object, initial encounter: Secondary | ICD-10-CM | POA: Insufficient documentation

## 2022-07-31 DIAGNOSIS — R519 Headache, unspecified: Secondary | ICD-10-CM | POA: Diagnosis not present

## 2022-07-31 DIAGNOSIS — M545 Low back pain, unspecified: Secondary | ICD-10-CM | POA: Diagnosis not present

## 2022-07-31 MED ORDER — LIDOCAINE 5 % EX PTCH: 1.0000 | MEDICATED_PATCH | CUTANEOUS | 0 refills | Status: DC

## 2022-07-31 NOTE — Discharge Instructions (Addendum)
CT imaging and x-ray imaging was negative for intracranial bleeding, fracture in your skull or spine and no evidence of any other broken bones.  Recommend Tylenol 1 g every 6 hours for pain control. OTC Lidocaine patches can be applied as needed.  CT head and Cervical Spine: IMPRESSION:  1. No evidence of significant acute traumatic injury to the skull,  brain or cervical spine.  2. Mild cerebral atrophy with extensive chronic microvascular  ischemic changes in the cerebral white matter redemonstrated, as  above.  3. Multilevel degenerative disc disease and cervical spondylosis, as  above.   CT Lumbar Spine: IMPRESSION:  1. Severe osteoporosis but no obvious acute lumbar spine fracture.  2. Posterior and interbody fusion changes from L3-S1 without obvious  complicating features such as fracture or loosening.  3. Advanced degenerative disc disease and facet disease at L2-3 with  mild spinal and bilateral lateral recess stenosis.  4. Aortic atherosclerosis.    Aortic Atherosclerosis (ICD10-I70.0).    XR Right Shoulder:  IMPRESSION:  1. No fracture or dislocation.  2. AC joint osteoarthritis.   XR Left Hip and Pelvis: IMPRESSION:  1. No fracture or acute finding.  2. Mild hip joint degenerative changes.

## 2022-07-31 NOTE — ED Provider Notes (Signed)
[place] AT [facility] Provider Note   CSN: [id number] Arrival date & time: 07/31/22  1313     History  Chief Complaint  Patient presents with   Lytle Michaels    Stephanie Alvarado is a 82 y.o. female.   Fall     82 year old female with medical history significant for HTN, HLD, CAD, diastolic heart failure, PVD, obesity, CVA and PE and DVT on Eliquis who presents emergency department after a fall.  The patient was sat [place] last night and slipped on the floor and fell backwards striking the back of her head.  No loss of consciousness.  She landed on her left hip and right shoulder and has been having pain in both of those places.  She has been ambulatory.  She initially presented to urgent care and was directed to present to the emergency department for further evaluation as she fell and was on Eliquis.  She arrives to the emerged department GCS 15, ABC intact.  Home Medications Prior to Admission medications   Medication Sig Start Date End Date Taking? Authorizing Provider  lidocaine (LIDODERM) 5 % Place 1 patch onto the skin daily. Remove & Discard patch within 12 hours or as directed by MD 07/31/22  Yes Regan Lemming, MD  ALLERGY RELIEF CETIRIZINE 10 MG tablet Take 10 mg by mouth at bedtime. 02/14/22   [provider]  apixaban (ELIQUIS) 2.5 MG TABS tablet Take 1 tablet (2.5 mg total) by mouth 2 (two) times daily. 06/22/22   Orson Slick, MD  buPROPion (WELLBUTRIN XL) 150 MG 24 hr tablet Take 300 mg by mouth in the morning. 10/31/15   [provider]  dicyclomine (BENTYL) 20 MG tablet Take 1 tablet (20 mg total) by mouth every 6 (six) hours. 01/18/22   Vladimir Crofts, PA-C  furosemide (LASIX) 40 MG tablet Take 1 tablet (40 mg total) by mouth daily. Patient taking differently: Take 40 mg by mouth 2 (two) times a week. 09/09/21   Sherren Mocha, MD  gabapentin (NEURONTIN) 300 MG capsule Take 600 mg by mouth 3 (three) times daily.   10/31/15   [provider]  isosorbide mononitrate (IMDUR) 30 MG 24 hr tablet Take 0.5 tablets (15 mg total) by mouth at bedtime. 07/19/21   Eugenie Filler, MD  latanoprost (XALATAN) 0.005 % ophthalmic solution Place 1 drop into both eyes at bedtime. 08/04/21   [provider]  metoprolol tartrate (LOPRESSOR) 25 MG tablet Take 25 mg by mouth in the morning and at bedtime.    [provider]  nitroGLYCERIN (NITROSTAT) 0.4 MG SL tablet Place 0.4 mg under the tongue every 5 (five) minutes as needed for chest pain (TAKE UP TO 3 DOSES BEFORE CALLING 911).    [provider]  nystatin cream (MYCOSTATIN) Apply 1 Application topically 2 (two) times daily. Patient taking differently: Apply 1 Application topically 2 (two) times daily as needed (for itching under the breasts). 01/18/22   Vladimir Crofts, PA-C  OXYGEN Inhale 2 L/min into the lungs as needed (for shortness of breath).    [provider]  pantoprazole (PROTONIX) 40 MG tablet Take 40 mg by mouth 2 (two) times daily before a meal.    [provider]  polyethylene glycol powder (GLYCOLAX/MIRALAX) powder Take 17 g by mouth daily as needed for moderate constipation.     Sable Feil, MD  potassium chloride (KLOR-CON) 10 MEQ tablet Take 1 tablet (10 mEq total) by  mouth daily. 09/09/21   Sherren Mocha, MD  PRESCRIPTION MEDICATION See admin instructions. CPAP- At bedtime    [provider]  rosuvastatin (CRESTOR) 10 MG tablet Take 10 mg by mouth at bedtime.    [provider]  tiZANidine (ZANAFLEX) 4 MG tablet Take 4 mg by mouth daily as needed for muscle spasms. 08/09/21   [provider]  TYLENOL 500 MG tablet Take 500-1,500 mg by mouth 2 (two) times daily as needed for mild pain or headache.    [provider]      Allergies    Codeine    Review of Systems   Review of Systems  All other systems reviewed and are negative.   Physical Exam Updated  Vital Signs BP (!) 187/73   Pulse 64   Temp 97.9 F (36.6 C) (Oral)   Resp 18   Ht 5' 7"$  (1.702 m)   Wt 88 kg   SpO2 96%   BMI 30.38 kg/m  Physical Exam Vitals and nursing note reviewed.  Constitutional:      General: She is not in acute distress.    Appearance: She is well-developed.     Comments: GCS 15, ABC intact  HENT:     Head: Normocephalic and atraumatic.  Eyes:     Extraocular Movements: Extraocular movements intact.     Conjunctiva/sclera: Conjunctivae normal.     Pupils: Pupils are equal, round, and reactive to light.  Neck:     Comments: No midline tenderness to palpation of the cervical spine.  Range of motion intact Cardiovascular:     Rate and Rhythm: Normal rate and regular rhythm.  Pulmonary:     Effort: Pulmonary effort is normal. No respiratory distress.     Breath sounds: Normal breath sounds.  Chest:     Comments: Clavicles stable nontender to AP compression.  Chest wall stable and nontender to AP and lateral compression. Abdominal:     Palpations: Abdomen is soft.     Tenderness: There is no abdominal tenderness.     Comments: Pelvis stable to lateral compression  Musculoskeletal:     Cervical back: Neck supple.     Comments: No midline tenderness to palpation of the thoracic or lumbar spine.  Tenderness to palpation of the left hip, tenderness to palpation of the right posterior scapula and shoulder.  Skin:    General: Skin is warm and dry.  Neurological:     Mental Status: She is alert.     Comments: Cranial nerves II through XII grossly intact.  Moving all 4 extremities spontaneously.  Sensation grossly intact all 4 extremities     ED Results / Procedures / Treatments   Labs (all labs ordered are listed, but only abnormal results are displayed) Labs Reviewed - No data to display  EKG None  Radiology CT HEAD WO CONTRAST (5MM)  Result Date: 07/31/2022 CLINICAL DATA:  82 year old female history of head trauma from a fall. EXAM: CT HEAD  WITHOUT CONTRAST CT CERVICAL SPINE WITHOUT CONTRAST TECHNIQUE: Multidetector CT imaging of the head and cervical spine was performed following the standard protocol without intravenous contrast. Multiplanar CT image reconstructions of the cervical spine were also generated. RADIATION DOSE REDUCTION: This exam was performed according to the departmental dose-optimization program which includes automated exposure control, adjustment of the mA and/or kV according to patient size and/or use of iterative reconstruction technique. COMPARISON:  Head and cervical spine CT 06/11/2022. FINDINGS: CT HEAD FINDINGS Brain: Mild cerebral atrophy. Patchy and confluent  areas of decreased attenuation are noted throughout the deep and periventricular white matter of the cerebral hemispheres bilaterally, compatible with chronic microvascular ischemic disease. Several small cortical calcifications are noted throughout the right cerebral hemisphere, potentially related to remote infection or prior hemorrhage, unchanged. No evidence of acute infarction, hemorrhage, hydrocephalus, extra-axial collection or mass lesion/mass effect. Vascular: No hyperdense vessel or unexpected calcification. Skull: Normal. Negative for fracture or focal lesion. Sinuses/Orbits: No acute finding. Other: None. CT CERVICAL SPINE FINDINGS Alignment: Reversal of normal cervical lordosis centered at the level of C2-C3, likely positional. Alignment is otherwise anatomic. Skull base and vertebrae: No acute fracture. No primary bone lesion or focal pathologic process. Soft tissues and spinal canal: No prevertebral fluid or swelling. No visible canal hematoma. Disc levels: Multilevel degenerative disc disease, most evident at C3-C4. Moderate multilevel facet arthropathy bilaterally. Upper chest: Unremarkable. Other: None. IMPRESSION: 1. No evidence of significant acute traumatic injury to the skull, brain or cervical spine. 2. Mild cerebral atrophy with extensive  chronic microvascular ischemic changes in the cerebral white matter redemonstrated, as above. 3. Multilevel degenerative disc disease and cervical spondylosis, as above. Electronically Signed   By: Vinnie Langton M.D.   On: 07/31/2022 14:48   CT Cervical Spine Wo Contrast  Result Date: 07/31/2022 CLINICAL DATA:  82 year old female history of head trauma from a fall. EXAM: CT HEAD WITHOUT CONTRAST CT CERVICAL SPINE WITHOUT CONTRAST TECHNIQUE: Multidetector CT imaging of the head and cervical spine was performed following the standard protocol without intravenous contrast. Multiplanar CT image reconstructions of the cervical spine were also generated. RADIATION DOSE REDUCTION: This exam was performed according to the departmental dose-optimization program which includes automated exposure control, adjustment of the mA and/or kV according to patient size and/or use of iterative reconstruction technique. COMPARISON:  Head and cervical spine CT 06/11/2022. FINDINGS: CT HEAD FINDINGS Brain: Mild cerebral atrophy. Patchy and confluent areas of decreased attenuation are noted throughout the deep and periventricular white matter of the cerebral hemispheres bilaterally, compatible with chronic microvascular ischemic disease. Several small cortical calcifications are noted throughout the right cerebral hemisphere, potentially related to remote infection or prior hemorrhage, unchanged. No evidence of acute infarction, hemorrhage, hydrocephalus, extra-axial collection or mass lesion/mass effect. Vascular: No hyperdense vessel or unexpected calcification. Skull: Normal. Negative for fracture or focal lesion. Sinuses/Orbits: No acute finding. Other: None. CT CERVICAL SPINE FINDINGS Alignment: Reversal of normal cervical lordosis centered at the level of C2-C3, likely positional. Alignment is otherwise anatomic. Skull base and vertebrae: No acute fracture. No primary bone lesion or focal pathologic process. Soft tissues and  spinal canal: No prevertebral fluid or swelling. No visible canal hematoma. Disc levels: Multilevel degenerative disc disease, most evident at C3-C4. Moderate multilevel facet arthropathy bilaterally. Upper chest: Unremarkable. Other: None. IMPRESSION: 1. No evidence of significant acute traumatic injury to the skull, brain or cervical spine. 2. Mild cerebral atrophy with extensive chronic microvascular ischemic changes in the cerebral white matter redemonstrated, as above. 3. Multilevel degenerative disc disease and cervical spondylosis, as above. Electronically Signed   By: Vinnie Langton M.D.   On: 07/31/2022 14:48   CT Lumbar Spine Wo Contrast  Result Date: 07/31/2022 CLINICAL DATA:  [place] last evening.  Back pain. EXAM: CT LUMBAR SPINE WITHOUT CONTRAST TECHNIQUE: Multidetector CT imaging of the lumbar spine was performed without intravenous contrast administration. Multiplanar CT image reconstructions were also generated. RADIATION DOSE REDUCTION: This exam was performed according to the departmental dose-optimization program which includes automated exposure control, adjustment of the  mA and/or kV according to patient size and/or use of iterative reconstruction technique. COMPARISON:  CT scan 02/03/2022 FINDINGS: Segmentation: There are five lumbar type vertebral bodies. The last full intervertebral disc space is labeled L5-S1. Alignment: Normal Vertebrae: Severe osteoporosis but no obvious acute lumbar spine fracture. Moderate artifact associated with the fusion hardware from L3-S1. No obvious complicating features such as fracture or loosening. Solid-appearing interbody fusion changes. Paraspinal and other soft tissues: Advanced aortic calcifications but no aneurysm. No retroperitoneal mass or hematoma. Disc levels: L1-2: Severe degenerative disc disease but no significant spinal or foraminal stenosis. L2-3: Advanced degenerative disc disease and facet disease with mild spinal and bilateral lateral  recess stenosis. L3-4: Posterior and interbody fusion changes with wide decompressive laminectomy. No obvious spinal or foraminal stenosis. L4-5: Posterior and interbody fusion hardware and wide decompressive laminectomy. No obvious spinal foraminal stenosis. L5-S1: Posterior and interbody fusion hardware. Wide decompressive laminectomy. No obvious spinal or foraminal stenosis. IMPRESSION: 1. Severe osteoporosis but no obvious acute lumbar spine fracture. 2. Posterior and interbody fusion changes from L3-S1 without obvious complicating features such as fracture or loosening. 3. Advanced degenerative disc disease and facet disease at L2-3 with mild spinal and bilateral lateral recess stenosis. 4. Aortic atherosclerosis. Aortic Atherosclerosis (ICD10-I70.0). Electronically Signed   By: Marijo Sanes M.D.   On: 07/31/2022 14:47   DG HIP UNILAT WITH PELVIS 2-3 VIEWS LEFT  Result Date: 07/31/2022 CLINICAL DATA:  Fall last night.  Bilateral hip pain. EXAM: DG HIP (WITH OR WITHOUT PELVIS) 2-3V LEFT COMPARISON:  03/01/2021. FINDINGS: No fracture.  No bone lesion. Mild medial right hip joint space narrowing. Left hip joint normally spaced and aligned. Small marginal osteophytes from the base of the left femoral head. SI joints and pubic symphysis normally aligned. Previous posterior lumbar spine fusion. Arterial vascular calcifications. Soft tissues otherwise unremarkable. IMPRESSION: 1. No fracture or acute finding. 2. Mild hip joint degenerative changes. Electronically Signed   By: Lajean Manes M.D.   On: 07/31/2022 14:20   DG Shoulder Right  Result Date: 07/31/2022 CLINICAL DATA:  Fall.  Right shoulder pain. EXAM: RIGHT SHOULDER - 2+ VIEW COMPARISON:  None Available. FINDINGS: No fracture.  No bone lesion. Glenohumeral joint normally spaced and aligned. AC joint narrowed with small marginal spurs and synovial calcifications. Skeletal structures are demineralized. Soft tissues are unremarkable. IMPRESSION: 1. No  fracture or dislocation. 2. AC joint osteoarthritis. Electronically Signed   By: Lajean Manes M.D.   On: 07/31/2022 14:19    Procedures Procedures    Medications Ordered in ED Medications - No data to display  ED Course/ Medical Decision Making/ A&P                             Medical Decision Making Amount and/or Complexity of Data Reviewed Radiology: ordered.  Risk Prescription drug management.    82 year old female with medical history significant for HTN, HLD, CAD, diastolic heart failure, PVD, obesity, CVA and PE and DVT on Eliquis who presents emergency department after a fall.  The patient was sat Reginal Lutes last night and slipped on the floor and fell backwards striking the back of her head.  No loss of consciousness.  She landed on her left hip and right shoulder and has been having pain in both of those places.  She has been ambulatory.  She initially presented to urgent care and was directed to present to the emergency department for further evaluation pain.  [place] and was on Eliquis.  She arrives to the emerged department GCS 15, ABC intact.  On arrival, the patient was afebrile, hemodynamically stable, not tachycardic or tachypneic, hypertensive BP 187/73, saturating 96% on room air, sinus rhythm noted on cardiac telemetry.  Currently, she is awake, alert, and protecting her own airway and is hemodynamically stable. .  Trauma imaging revealed (full reports in EMR): CT Head and Cervical Spine: IMPRESSION:  1. No evidence of significant acute traumatic injury to the skull,  brain or cervical spine.  2. Mild cerebral atrophy with extensive chronic microvascular  ischemic changes in the cerebral white matter redemonstrated, as  above.  3. Multilevel degenerative disc disease and cervical spondylosis, as  above.   CT Lumbar Spine: IMPRESSION:  1. Severe osteoporosis but no obvious acute lumbar spine fracture.  2. Posterior and interbody fusion changes from L3-S1  without obvious  complicating features such as fracture or loosening.  3. Advanced degenerative disc disease and facet disease at L2-3 with  mild spinal and bilateral lateral recess stenosis.  4. Aortic atherosclerosis.    Aortic Atherosclerosis (ICD10-I70.0).    XR Right Shoulder: IMPRESSION:  1. No fracture or dislocation.  2. AC joint osteoarthritis.    XR Left Hip and Pelvis: IMPRESSION:  1. No fracture or acute finding.  2. Mild hip joint degenerative changes.    No evidence of acute traumatic injury identified on the patient's trauma imaging.  The patient on physical exam had no other evidence of bony injury or abnormality.  Likely musculoskeletal soft tissue injury in the setting of her recent trauma.  Patient cannot take NSAIDs because she is on Eliquis, advised Tylenol 1 g every 6 hours for pain control, lidocaine patch to the affected areas as needed OTC.  Patient was ambulatory on repeat assessment and at her baseline mental status, overall stable for discharge with outpatient follow-up.  Final Clinical Impression(s) / ED Diagnoses Final diagnoses:  Fall, initial encounter    Rx / DC Orders ED Discharge Orders          Ordered    lidocaine (LIDODERM) 5 %  Every 24 hours        07/31/22 1512              Regan Lemming, MD 07/31/22 1540

## 2022-07-31 NOTE — ED Triage Notes (Signed)
Patient arrives ambulatory with cane by POV c/o fall last night. Patient states she was at restaurant and floor was greasy causing her to fall backwards. Patient states she hit back of her head and also having pain to left hip and right shoulder. Patient on eliquis.

## 2022-07-31 NOTE — ED Provider Triage Note (Signed)
Emergency Medicine Provider Triage Evaluation Note  Stephanie Alvarado , a 82 y.o. female  was evaluated in triage.  Pt complains of injury sustained during a fall last evening.  Patient was at a restaurant and slipped on some grease.  She fell backwards and struck the back of her head.  No loss of consciousness.  Since that time she has had a posterior headache, neck pain, lower back pain, and right shoulder pain.  Patient went with her husband to an urgent care today, was referred to the emergency department for imaging.  She denies vomiting, confusion.  No weakness, numbness, or tingling in the arms or legs.  Review of Systems  Positive: Head pain, neck pain, shoulder pain, low back pain Negative: Confusion, vomiting  Physical Exam  BP (!) 187/73   Pulse 64   Temp 97.9 F (36.6 C) (Oral)   Resp 18   Ht 5' 7"$  (1.702 m)   Wt 88 kg   SpO2 96%   BMI 30.38 kg/m  Gen:   Awake, no distress   Resp:  Normal effort  MSK:   Moves extremities without difficulty  Other:  No large scalp hematoma  Medical Decision Making  Medically screening exam initiated at 1:37 PM.  Appropriate orders placed.  [name] was informed that the remainder of the evaluation will be completed by another provider, this initial triage assessment does not replace that evaluation, and the importance of remaining in the ED until their evaluation is complete.     Carlisle Cater, PA-C 07/31/22 1445

## 2022-08-02 ENCOUNTER — Other Ambulatory Visit: Payer: Self-pay | Admitting: Cardiovascular Disease

## 2022-08-02 DIAGNOSIS — H16223 Keratoconjunctivitis sicca, not specified as Sjogren's, bilateral: Secondary | ICD-10-CM | POA: Diagnosis not present

## 2022-08-02 DIAGNOSIS — H40023 Open angle with borderline findings, high risk, bilateral: Secondary | ICD-10-CM | POA: Diagnosis not present

## 2022-08-02 DIAGNOSIS — H35363 Drusen (degenerative) of macula, bilateral: Secondary | ICD-10-CM | POA: Diagnosis not present

## 2022-08-02 DIAGNOSIS — H35033 Hypertensive retinopathy, bilateral: Secondary | ICD-10-CM | POA: Diagnosis not present

## 2022-08-11 DIAGNOSIS — K219 Gastro-esophageal reflux disease without esophagitis: Secondary | ICD-10-CM | POA: Diagnosis not present

## 2022-08-11 DIAGNOSIS — J449 Chronic obstructive pulmonary disease, unspecified: Secondary | ICD-10-CM | POA: Diagnosis not present

## 2022-08-11 DIAGNOSIS — F331 Major depressive disorder, recurrent, moderate: Secondary | ICD-10-CM | POA: Diagnosis not present

## 2022-08-11 DIAGNOSIS — E782 Mixed hyperlipidemia: Secondary | ICD-10-CM | POA: Diagnosis not present

## 2022-08-29 ENCOUNTER — Other Ambulatory Visit: Payer: Self-pay | Admitting: Physician Assistant

## 2022-08-29 DIAGNOSIS — K5730 Diverticulosis of large intestine without perforation or abscess without bleeding: Secondary | ICD-10-CM

## 2022-08-29 DIAGNOSIS — R1032 Left lower quadrant pain: Secondary | ICD-10-CM

## 2022-08-30 DIAGNOSIS — M546 Pain in thoracic spine: Secondary | ICD-10-CM | POA: Diagnosis not present

## 2022-08-30 DIAGNOSIS — M545 Low back pain, unspecified: Secondary | ICD-10-CM | POA: Diagnosis not present

## 2022-09-06 DIAGNOSIS — G4733 Obstructive sleep apnea (adult) (pediatric): Secondary | ICD-10-CM | POA: Diagnosis not present

## 2022-09-06 DIAGNOSIS — R0902 Hypoxemia: Secondary | ICD-10-CM | POA: Diagnosis not present

## 2022-09-07 NOTE — Progress Notes (Unsigned)
09/08/2022 VANDANA SNELLING OP:[id number] 1941-02-17  Referring provider: Caryl Bis, MD Primary GI doctor: Dr. Henrene Pastor  ASSESSMENT AND PLAN:   LLQ abdominal pain with history of diverticulitis in Jan, possibly again in August treated with augmentin and improvement of symptoms LLQ pain with associated chills x 2 week, worse with movement, slight rebound on exam Suspicious for diverticulitis/colitis with history and physical exam, patient hemodynamically stable - get CBC, CMET, and sed rate. -Will schedule for STAT CT AB and pelvis with contrast, patient going today. -IBGARD daily, will give Bentyl as needed, heating pad and liquid diet. - Prescribed Augmentin - ER precautions discussed with the patient -close follow up If negative consider linzess for IBS-C, possible muscular with lower back/hip, pelvic floor Last colonoscopy 2013, at this time patient is very high risk for any endoscopic evaluation.    Chronic constipation Continue miralax Can consider linzess  History of pulmonary embolus (PE) with COPD/on oxygen as needed On Eliquis   History of Present Illness:  82 y.o. female  with a past medical history of anxiety, coronary artery disease status post stent 2011, hypertension, hyperlipidemia, sleep-related hypoventilation with CPAP and intermittent oxygen and others listed below, returns to clinic today for evaluation of left lower quadrant abdominal pain.  04/2012 colonoscopy 2 flat polyps tubular adenomas, severe diverticulosis descending sigmoid colon 09/15/2017 CT abdomen pelvis showed diverticulosis without diverticulitis, small hiatal hernia lumbar fusions, post cholecystectomy and hysterectomy 06/25/2020 left-sided abdominal pain seen by Alonza Bogus in the office reporting constipation, was hoping to get colonoscopy.  Due to age and comorbidities this was not advised.  07/06/2021 CT abdomen pelvis for right lower quadrant pain concerning for diverticulitis of small  bowel in the mid abdomen no abscess or perforation, was there for 2 weeks, during that hospitalization had hypoxia/chest pain with subsequent CTA on  07/12/2021 demonstrated extensive bilateral pulmonary embolus with high clot burden with right heart strain small type I hiatal hernia small right pleural effusion and opacity in right lower lobe. She continues to use O2 intermittently since in the hospital and has SOB with exertion, she is on Eliquis.  01/18/2022 office visit with myself for chronic constipation, left lower quadrant abdominal pain Given Augmentin for possible diverticulitis 02/03/2022 CT abdomen pelvis with contrast showed mild left colon diverticulosis without diverticulitis.  No hernia.  Posterior lumbar fusion decompression without acute effects.  Benign left renal cyst.  Right lobe pulmonary nodule needs noncontrast CT 12 months.  She is on O2 as needed. She is on eliquis for recent PE.  Patient is here for follow-up of constipation. She was doing well with her AB pain, started two weeks ago tomorrow, she states she is having severe pain. She has had chills, no fever. She has severe LLQ AB pain, worse with movement.  She did have one episode of vomiting the other day, but no nausea.  She has been on miralax which has been better, can have BM daily or every other day.  No melena, no hematochezia.  She states the dicyclomine helped.  She has had urinary frequency, urgency, but no dysuria, no hematuria.   Current Medications:    Current Outpatient Medications (Cardiovascular):    furosemide (LASIX) 40 MG tablet, Take 1 tablet (40 mg total) by mouth daily. (Patient taking differently: Take 40 mg by mouth 2 (two) times a week.)   isosorbide mononitrate (IMDUR) 30 MG 24 hr tablet, Take 0.5 tablets (15 mg total) by mouth at bedtime.   metoprolol tartrate (  LOPRESSOR) 25 MG tablet, Take 25 mg by mouth in the morning and at bedtime.   nitroGLYCERIN (NITROSTAT) 0.4 MG SL tablet, Place 0.4  mg under the tongue every 5 (five) minutes as needed for chest pain (TAKE UP TO 3 DOSES BEFORE CALLING 911).   rosuvastatin (CRESTOR) 10 MG tablet, Take 10 mg by mouth at bedtime.  Current Outpatient Medications (Respiratory):    ALLERGY RELIEF CETIRIZINE 10 MG tablet, Take 10 mg by mouth at bedtime.  Current Outpatient Medications (Analgesics):    TYLENOL 500 MG tablet, Take 500-1,500 mg by mouth 2 (two) times daily as needed for mild pain or headache.  Current Outpatient Medications (Hematological):    apixaban (ELIQUIS) 2.5 MG TABS tablet, Take 1 tablet (2.5 mg total) by mouth 2 (two) times daily.  Current Outpatient Medications (Other):    amoxicillin-clavulanate (AUGMENTIN) 875-125 MG tablet, Take 1 tablet by mouth 2 (two) times daily.   buPROPion (WELLBUTRIN XL) 150 MG 24 hr tablet, Take 300 mg by mouth in the morning.   gabapentin (NEURONTIN) 300 MG capsule, Take 600 mg by mouth 3 (three) times daily.    latanoprost (XALATAN) 0.005 % ophthalmic solution, Place 1 drop into both eyes at bedtime.   lidocaine (LIDODERM) 5 %, Place 1 patch onto the skin daily. Remove & Discard patch within 12 hours or as directed by MD   nystatin cream (MYCOSTATIN), Apply 1 Application topically 2 (two) times daily. (Patient taking differently: Apply 1 Application topically 2 (two) times daily as needed (for itching under the breasts).)   OXYGEN, Inhale 2 L/min into the lungs as needed (for shortness of breath).   pantoprazole (PROTONIX) 40 MG tablet, Take 40 mg by mouth 2 (two) times daily before a meal.   polyethylene glycol powder (GLYCOLAX/MIRALAX) powder, Take 17 g by mouth daily as needed for moderate constipation.    potassium chloride (KLOR-CON) 10 MEQ tablet, TAKE ONE TABLET BY MOUTH EVERY MORNING   PRESCRIPTION MEDICATION, See admin instructions. CPAP- At bedtime   tiZANidine (ZANAFLEX) 4 MG tablet, Take 4 mg by mouth daily as needed for muscle spasms.   dicyclomine (BENTYL) 20 MG tablet, Take 1  tablet (20 mg total) by mouth 3 (three) times daily as needed for spasms.  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 Family History:  Her family history includes Alzheimer's disease in her mother; Breast cancer in an other family member; Colon cancer in her paternal aunt; Heart attack in her father; Heart disease in her father. Social History:   reports that she quit smoking about 24 years ago. Her smoking use included cigarettes. She has a 40.00 pack-year smoking history. She has never used smokeless tobacco. She reports that she does not drink alcohol and does not use drugs.  Current Medications, Allergies, Past Medical History, Past Surgical History, Family History and Social History were reviewed in [employer] record.  Physical Exam: BP 130/82   Pulse 67   Ht 5\' 7"  (1.702 m)   Wt 189 lb 2 oz (85.8 kg)   BMI 29.62 kg/m  General:   Pleasant, obese female in no acute distress Heart : Regular rate and rhythm; no murmurs Pulm: Diffusely decreased  breath sounds Abdomen:  Soft, Obese AB, Active bowel sounds. moderate tenderness in the LLQ. With guarding and Without rebound, No organomegaly appreciated. Rectal: Not evaluated Extremities:  with  edema, mild non pitting, walks with cane.  Neurologic:  Alert and  oriented x4;  No focal deficits.  Psych:  Cooperative. Normal mood and affect.   Vladimir Crofts, PA-C 09/08/22

## 2022-09-08 ENCOUNTER — Ambulatory Visit (INDEPENDENT_AMBULATORY_CARE_PROVIDER_SITE_OTHER): Payer: PPO | Admitting: Physician Assistant

## 2022-09-08 ENCOUNTER — Other Ambulatory Visit (INDEPENDENT_AMBULATORY_CARE_PROVIDER_SITE_OTHER): Payer: PPO

## 2022-09-08 ENCOUNTER — Ambulatory Visit (HOSPITAL_BASED_OUTPATIENT_CLINIC_OR_DEPARTMENT_OTHER)
Admission: RE | Admit: 2022-09-08 | Discharge: 2022-09-08 | Disposition: A | Discharge status: Home / Self Care | Payer: PPO | Source: Ambulatory Visit | Attending: Physician Assistant | Admitting: Physician Assistant

## 2022-09-08 ENCOUNTER — Encounter: Payer: Self-pay | Admitting: Physician Assistant

## 2022-09-08 VITALS — BP 130/82 | HR 67 | Ht 67.0 in | Wt 189.1 lb

## 2022-09-08 DIAGNOSIS — K5909 Other constipation: Secondary | ICD-10-CM | POA: Diagnosis not present

## 2022-09-08 DIAGNOSIS — Z9981 Dependence on supplemental oxygen: Secondary | ICD-10-CM | POA: Diagnosis not present

## 2022-09-08 DIAGNOSIS — Z86711 Personal history of pulmonary embolism: Secondary | ICD-10-CM | POA: Diagnosis not present

## 2022-09-08 DIAGNOSIS — R1032 Left lower quadrant pain: Secondary | ICD-10-CM | POA: Insufficient documentation

## 2022-09-08 DIAGNOSIS — K5730 Diverticulosis of large intestine without perforation or abscess without bleeding: Secondary | ICD-10-CM

## 2022-09-08 DIAGNOSIS — J9611 Chronic respiratory failure with hypoxia: Secondary | ICD-10-CM | POA: Diagnosis not present

## 2022-09-08 DIAGNOSIS — I700 Atherosclerosis of aorta: Secondary | ICD-10-CM | POA: Diagnosis not present

## 2022-09-08 LAB — POCT I-STAT CREATININE: Creatinine, Ser: 1 mg/dL (ref 0.44–1.00)

## 2022-09-08 LAB — CBC WITH DIFFERENTIAL/PLATELET
Basophils Absolute: 0.1 10*3/uL (ref 0.0–0.1)
Basophils Relative: 1.4 % (ref 0.0–3.0)
Eosinophils Absolute: 0.2 10*3/uL (ref 0.0–0.7)
Eosinophils Relative: 3 % (ref 0.0–5.0)
HCT: 39.6 % (ref 36.0–46.0)
Hemoglobin: 12.9 g/dL (ref 12.0–15.0)
Lymphocytes Relative: 22.1 % (ref 12.0–46.0)
Lymphs Abs: 1.4 10*3/uL (ref 0.7–4.0)
MCHC: 32.5 g/dL (ref 30.0–36.0)
MCV: 79.7 fl (ref 78.0–100.0)
Monocytes Absolute: 0.8 10*3/uL (ref 0.1–1.0)
Monocytes Relative: 12.4 % — ABNORMAL HIGH (ref 3.0–12.0)
Neutro Abs: 3.8 10*3/uL (ref 1.4–7.7)
Neutrophils Relative %: 61.1 % (ref 43.0–77.0)
Platelets: 113 10*3/uL — ABNORMAL LOW (ref 150.0–400.0)
RBC: 4.96 Mil/uL (ref 3.87–5.11)
RDW: 21.4 % — ABNORMAL HIGH (ref 11.5–15.5)
WBC: 6.2 10*3/uL (ref 4.0–10.5)

## 2022-09-08 LAB — COMPREHENSIVE METABOLIC PANEL
ALT: 5 U/L (ref 0–35)
AST: 13 U/L (ref 0–37)
Albumin: 4.1 g/dL (ref 3.5–5.2)
Alkaline Phosphatase: 85 U/L (ref 39–117)
BUN: 13 mg/dL (ref 6–23)
CO2: 30 mEq/L (ref 19–32)
Calcium: 10.4 mg/dL (ref 8.4–10.5)
Chloride: 101 mEq/L (ref 96–112)
Creatinine, Ser: 1.05 mg/dL (ref 0.40–1.20)
GFR: 49.13 mL/min — ABNORMAL LOW (ref >60.00)
Glucose, Bld: 97 mg/dL (ref 70–99)
Potassium: 4.3 mEq/L (ref 3.5–5.1)
Sodium: 137 mEq/L (ref 135–145)
Total Bilirubin: 0.8 mg/dL (ref 0.2–1.2)
Total Protein: 7.2 g/dL (ref 6.0–8.3)

## 2022-09-08 LAB — SEDIMENTATION RATE: Sed Rate: 48 mm/hr — ABNORMAL HIGH (ref 0–30)

## 2022-09-08 MED ORDER — IOHEXOL 300 MG/ML  SOLN
100.0000 mL | Freq: Once | INTRAMUSCULAR | Status: AC | PRN
  Administered 2022-09-08: 85 mL via INTRAVENOUS

## 2022-09-08 MED ORDER — AMOXICILLIN-POT CLAVULANATE 875-125 MG PO TABS: 1.0000 | ORAL_TABLET | Freq: Two times a day (BID) | ORAL | 0 refills | Status: DC

## 2022-09-08 MED ORDER — DICYCLOMINE HCL 20 MG PO TABS: 20.0000 mg | ORAL_TABLET | Freq: Three times a day (TID) | ORAL | 1 refills | Status: DC | PRN

## 2022-09-08 NOTE — Progress Notes (Signed)
Noted  

## 2022-09-08 NOTE — Patient Instructions (Signed)
Your provider has requested that you go to the basement level for lab work before leaving today. Press "B" on the elevator. The lab is located at the first door on the left as you exit the elevator.  You have been scheduled for a CT scan of the abdomen and pelvis today at [facility], [place] [id number]).  If you have any questions regarding your exam or if you need to reschedule, you may call the CT department at [phone] between the hours of 8:00 am and 5:00 pm, Monday-Friday.  We have sent the following medications to your pharmacy for you to pick up at your convenience:   Bentyl - Take 1 tablet by mouth 3 times daily as needed for spasms. Augmentin - Take 1 tablet by mouth 2 times daily.  ________________________________________________________________________   Will give Augmentin Set up CT AB and pelvis with contrast Add on fiber supplement like BENEFIBER 1-2 x a day Can take dicyclomine at least 1-2 x a day for pain if needed.   Can do heating pad and can take tylenol max of 3000mg  a day.  Can add on lidocaine patches or voltern gel Go to the ER if unable to pass gas, severe AB pain, unable to hold down food, any shortness of breath of chest pain.   Diverticulitis Diverticulitis is inflammation or infection of small pouches in your colon that form when you have a condition called diverticulosis. The pouches in your colon are called diverticula. Your colon, or large intestine, is where water is absorbed and stool is formed. Complications of diverticulitis can include: Bleeding. Severe infection. Severe pain. Perforation of your colon. Obstruction of your colon.  What are the causes? Diverticulitis is caused by bacteria. Diverticulitis happens when stool becomes trapped in diverticula. This allows bacteria to grow in the diverticula, which can lead to inflammation and infection. What increases the risk? People with diverticulosis are at risk for  diverticulitis. Eating a diet that does not include enough fiber from fruits and vegetables may make diverticulitis more likely to develop. What are the signs or symptoms? Symptoms of diverticulitis may include: Abdominal pain and tenderness. The pain is normally located on the left side of the abdomen, but may occur in other areas. Fever and chills. Bloating. Cramping. Nausea. Vomiting. Constipation. Diarrhea. Blood in your stool.  How is this diagnosed? Your health care provider will ask you about your medical history and do a physical exam. You may need to have tests done because many medical conditions can cause the same symptoms as diverticulitis. Tests may include: Blood tests. Urine tests. Imaging tests of the abdomen, including X-rays and CT scans.  When your condition is under control, your health care provider may recommend that you have a colonoscopy. A colonoscopy can show how severe your diverticula are and whether something else is causing your symptoms. How is this treated? Most cases of diverticulitis are mild and can be treated at home. Treatment may include: Taking over-the-counter pain medicines. Following a clear liquid diet. Taking antibiotic medicines by mouth for 7-10 days.  More severe cases may be treated at a hospital. Treatment may include: Not eating or drinking. Taking prescription pain medicine. Receiving antibiotic medicines through an IV tube. Receiving fluids and nutrition through an IV tube. Surgery.  Follow these instructions at home: Follow your health care provider's instructions carefully. Follow a full liquid diet or other diet as directed by your health care provider. After your symptoms improve, your health care provider  may tell you to change your diet. He or she may recommend you eat a high-fiber diet. Fruits and vegetables are good sources of fiber. Fiber makes it easier to pass stool. Take fiber supplements or probiotics as directed by  your health care provider. Only take medicines as directed by your health care provider. Keep all your follow-up appointments. Contact a health care provider if: Your pain does not improve. You have a hard time eating food. Your bowel movements do not return to normal. Get help right away if: Your pain becomes worse. Your symptoms do not get better. Your symptoms suddenly get worse. You have a fever. You have repeated vomiting. You have bloody or black, tarry stools. This information is not intended to replace advice given to you by your health care provider. Make sure you discuss any questions you have with your health care provider. Document Released: 03/09/2005 Document Revised: 11/05/2015 Document Reviewed: 04/24/2013 Elsevier Interactive Patient Education  2017 [employer].

## 2022-09-19 ENCOUNTER — Other Ambulatory Visit: Payer: Self-pay

## 2022-09-19 ENCOUNTER — Telehealth: Payer: Self-pay | Admitting: Physician Assistant

## 2022-09-19 MED ORDER — HYOSCYAMINE SULFATE 0.125 MG PO TABS: ORAL_TABLET | ORAL | 1 refills | Status: DC

## 2022-09-19 NOTE — Telephone Encounter (Signed)
Spoke with pt and she is aware of Dr. Lamar Sprinkles recommendations. Script sent to pharmacy for Levsin.

## 2022-09-19 NOTE — Telephone Encounter (Signed)
09/08/22 Pt was seen for LLQ pain and treated with augmentin. She only has one pill left for this evening but is still having some discomfort in the LLQ. She is calling to see if she can get a refill on the Augmentin. Marchelle Folks is off today. Please advise.

## 2022-09-19 NOTE — Telephone Encounter (Signed)
1.  No inflammation on TTE.  Thus, no indication for another round of antibiotics 2.  See PCP for musculoskeletal issues 3.  Can try Levsin sublingual.  1-2 every 4 to 6 hours as needed pain.  This may help if she is having diverticular spasm 4.  Make sure that she is taking MiraLAX in order to have 1 or 2 bowel movements per day.

## 2022-09-19 NOTE — Telephone Encounter (Signed)
Inbound call from patient, states spoke with Marchelle Folks in regards to CT results. She would like refills for medications because she states she is almost out. Also would like to further discuss ortho consult for Muscular issues.

## 2022-09-21 DIAGNOSIS — M533 Sacrococcygeal disorders, not elsewhere classified: Secondary | ICD-10-CM | POA: Diagnosis not present

## 2022-09-30 ENCOUNTER — Emergency Department (HOSPITAL_COMMUNITY): Payer: PPO

## 2022-09-30 ENCOUNTER — Other Ambulatory Visit: Payer: Self-pay

## 2022-09-30 ENCOUNTER — Emergency Department (HOSPITAL_COMMUNITY)
Admission: EM | Admit: 2022-09-30 | Discharge: 2022-09-30 | Disposition: A | Discharge status: Home / Self Care | Payer: PPO | Attending: Emergency Medicine | Admitting: Emergency Medicine

## 2022-09-30 ENCOUNTER — Encounter (HOSPITAL_COMMUNITY): Payer: Self-pay

## 2022-09-30 DIAGNOSIS — Z043 Encounter for examination and observation following other accident: Secondary | ICD-10-CM | POA: Diagnosis not present

## 2022-09-30 DIAGNOSIS — R58 Hemorrhage, not elsewhere classified: Secondary | ICD-10-CM | POA: Diagnosis not present

## 2022-09-30 DIAGNOSIS — W19XXXA Unspecified fall, initial encounter: Secondary | ICD-10-CM | POA: Diagnosis not present

## 2022-09-30 DIAGNOSIS — N3000 Acute cystitis without hematuria: Secondary | ICD-10-CM | POA: Insufficient documentation

## 2022-09-30 DIAGNOSIS — R1032 Left lower quadrant pain: Secondary | ICD-10-CM | POA: Insufficient documentation

## 2022-09-30 DIAGNOSIS — M25519 Pain in unspecified shoulder: Secondary | ICD-10-CM | POA: Diagnosis not present

## 2022-09-30 DIAGNOSIS — M25552 Pain in left hip: Secondary | ICD-10-CM | POA: Insufficient documentation

## 2022-09-30 DIAGNOSIS — I959 Hypotension, unspecified: Secondary | ICD-10-CM | POA: Diagnosis not present

## 2022-09-30 DIAGNOSIS — R062 Wheezing: Secondary | ICD-10-CM | POA: Diagnosis not present

## 2022-09-30 DIAGNOSIS — M25511 Pain in right shoulder: Secondary | ICD-10-CM | POA: Diagnosis not present

## 2022-09-30 DIAGNOSIS — S0990XA Unspecified injury of head, initial encounter: Secondary | ICD-10-CM | POA: Diagnosis not present

## 2022-09-30 DIAGNOSIS — K5730 Diverticulosis of large intestine without perforation or abscess without bleeding: Secondary | ICD-10-CM | POA: Diagnosis not present

## 2022-09-30 DIAGNOSIS — I700 Atherosclerosis of aorta: Secondary | ICD-10-CM | POA: Diagnosis not present

## 2022-09-30 LAB — I-STAT CHEM 8, ED
BUN: 22 mg/dL (ref 8–23)
Calcium, Ion: 1.18 mmol/L (ref 1.15–1.40)
Chloride: 100 mmol/L (ref 98–111)
Creatinine, Ser: 1.1 mg/dL — ABNORMAL HIGH (ref 0.44–1.00)
Glucose, Bld: 111 mg/dL — ABNORMAL HIGH (ref 70–99)
HCT: 41 % (ref 36.0–46.0)
Hemoglobin: 13.9 g/dL (ref 12.0–15.0)
Potassium: 4.4 mmol/L (ref 3.5–5.1)
Sodium: 135 mmol/L (ref 135–145)
TCO2: 27 mmol/L (ref 22–32)

## 2022-09-30 LAB — CBC
HCT: 41.4 % (ref 36.0–46.0)
Hemoglobin: 12.7 g/dL (ref 12.0–15.0)
MCH: 25.7 pg — ABNORMAL LOW (ref 26.0–34.0)
MCHC: 30.7 g/dL (ref 30.0–36.0)
MCV: 83.8 fL (ref 80.0–100.0)
Platelets: UNDETERMINED 10*3/uL (ref 150–400)
RBC: 4.94 MIL/uL (ref 3.87–5.11)
RDW: 20 % — ABNORMAL HIGH (ref 11.5–15.5)
WBC: 20.1 10*3/uL — ABNORMAL HIGH (ref 4.0–10.5)
nRBC: 0 % (ref 0.0–0.2)

## 2022-09-30 LAB — URINALYSIS, W/ REFLEX TO CULTURE (INFECTION SUSPECTED)
Bilirubin Urine: NEGATIVE
Glucose, UA: NEGATIVE mg/dL
Ketones, ur: NEGATIVE mg/dL
Nitrite: NEGATIVE
Protein, ur: 100 mg/dL — AB
RBC / HPF: >50 RBC/hpf (ref 0–5)
Specific Gravity, Urine: 1.023 (ref 1.005–1.030)
WBC, UA: >50 WBC/hpf (ref 0–5)
pH: 6 (ref 5.0–8.0)

## 2022-09-30 LAB — COMPREHENSIVE METABOLIC PANEL
ALT: 14 U/L (ref 0–44)
AST: 18 U/L (ref 15–41)
Albumin: 3.2 g/dL — ABNORMAL LOW (ref 3.5–5.0)
Alkaline Phosphatase: 82 U/L (ref 38–126)
Anion gap: 13 (ref 5–15)
BUN: 20 mg/dL (ref 8–23)
CO2: 24 mmol/L (ref 22–32)
Calcium: 8.7 mg/dL — ABNORMAL LOW (ref 8.9–10.3)
Chloride: 98 mmol/L (ref 98–111)
Creatinine, Ser: 1.15 mg/dL — ABNORMAL HIGH (ref 0.44–1.00)
GFR, Estimated: 47 mL/min — ABNORMAL LOW (ref >60)
Glucose, Bld: 111 mg/dL — ABNORMAL HIGH (ref 70–99)
Potassium: 4.2 mmol/L (ref 3.5–5.1)
Sodium: 135 mmol/L (ref 135–145)
Total Bilirubin: 1.2 mg/dL (ref 0.3–1.2)
Total Protein: 6.2 g/dL — ABNORMAL LOW (ref 6.5–8.1)

## 2022-09-30 LAB — SAMPLE TO BLOOD BANK

## 2022-09-30 MED ORDER — IOHEXOL 350 MG/ML SOLN
75.0000 mL | Freq: Once | INTRAVENOUS | Status: AC | PRN
  Administered 2022-09-30: 75 mL via INTRAVENOUS

## 2022-09-30 MED ORDER — IOHEXOL 350 MG/ML SOLN: 75.0000 mL | Freq: Once | INTRAVENOUS | Status: DC | PRN

## 2022-09-30 MED ORDER — CEFUROXIME AXETIL 500 MG PO TABS: 500.0000 mg | ORAL_TABLET | Freq: Two times a day (BID) | ORAL | 0 refills | Status: AC

## 2022-09-30 MED ORDER — FENTANYL CITRATE PF 50 MCG/ML IJ SOSY
50.0000 ug | PREFILLED_SYRINGE | Freq: Once | INTRAMUSCULAR | Status: AC
  Administered 2022-09-30: 50 ug via INTRAVENOUS
  Filled 2022-09-30: qty 1

## 2022-09-30 MED ORDER — SODIUM CHLORIDE 0.9 % IV SOLN
1.0000 g | Freq: Once | INTRAVENOUS | Status: AC
  Administered 2022-09-30: 1 g via INTRAVENOUS
  Filled 2022-09-30: qty 10

## 2022-09-30 MED ORDER — SODIUM CHLORIDE 0.9 % IV BOLUS
125.0000 mL | Freq: Once | INTRAVENOUS | Status: AC
  Administered 2022-09-30: 125 mL via INTRAVENOUS

## 2022-09-30 NOTE — ED Notes (Signed)
Put patient in the bedpan patient is now off placed a brief patient is resting with call bell in reach and family at bedside

## 2022-09-30 NOTE — ED Provider Notes (Signed)
[place] EMERGENCY DEPARTMENT AT [facility] Provider Note   CSN: [id number] Arrival date & time: 09/30/22  4098     History  Chief Complaint  Patient presents with   Fall    On thinners level 2    Stephanie Alvarado is a 82 y.o. female.   Fall   Patient presents to the ED for evaluation after a fall.  Patient states that she was trying to get up to go to the bathroom this morning.  She feels like her bed is a bit too high and she ended up falling.  Patient denies losing consciousness.  She did not feel like she got weak and fell.  She was not able to get up on her own and EMS was called.  Patient is not sure if she hit her head but she is on anticoagulation.  She was having some pain in her shoulder when they tried to help her up although she denies any pain now.  She also has been having some pain in her left hip.  Patient states she has been having pain in her abdomen for several weeks.  She did have a CAT scan the end of March.  Patient thought she had diverticulitis but this CAT scan was normal.  Her symptoms have persisted but she has had some urinary discomfort.  No focal numbness or weakness.  No severe headache.  No confusion.    Home Medications Prior to Admission medications   Medication Sig Start Date End Date Taking? Authorizing Provider  ALLERGY RELIEF CETIRIZINE 10 MG tablet Take 10 mg by mouth at bedtime. 02/14/22  Yes [provider]  apixaban (ELIQUIS) 2.5 MG TABS tablet Take 1 tablet (2.5 mg total) by mouth 2 (two) times daily. 06/22/22  Yes Jaci Standard, MD  buPROPion (WELLBUTRIN XL) 150 MG 24 hr tablet Take 300 mg by mouth in the morning. 10/31/15  Yes [provider]  cefUROXime (CEFTIN) 500 MG tablet Take 1 tablet (500 mg total) by mouth 2 (two) times daily with a meal for 7 days. 09/30/22 10/07/22 Yes Linwood Dibbles, MD  dicyclomine (BENTYL) 20 MG tablet Take 1 tablet (20 mg total) by mouth 3 (three) times daily as needed for spasms. 09/08/22   Yes Quentin Mulling R, PA-C  furosemide (LASIX) 40 MG tablet Take 1 tablet (40 mg total) by mouth daily. Patient taking differently: Take 40 mg by mouth 2 (two) times a week. 09/09/21  Yes Tonny Bollman, MD  gabapentin (NEURONTIN) 300 MG capsule Take 600 mg by mouth 3 (three) times daily.  10/31/15  Yes [provider]  hyoscyamine (LEVSIN) 0.125 MG tablet 1-2 tabs every 4-6 hours prn 09/19/22  Yes Hilarie Fredrickson, MD  isosorbide mononitrate (IMDUR) 30 MG 24 hr tablet Take 0.5 tablets (15 mg total) by mouth at bedtime. 07/19/21  Yes Rodolph Bong, MD  latanoprost (XALATAN) 0.005 % ophthalmic solution Place 1 drop into both eyes at bedtime. 08/04/21  Yes [provider]  lidocaine (LIDODERM) 5 % Place 1 patch onto the skin daily. Remove & Discard patch within 12 hours or as directed by MD 07/31/22  Yes Ernie Avena, MD  metoprolol tartrate (LOPRESSOR) 25 MG tablet Take 25 mg by mouth in the morning and at bedtime.   Yes [provider]  nitroGLYCERIN (NITROSTAT) 0.4 MG SL tablet Place 0.4 mg under the tongue every 5 (five) minutes as needed for chest pain (TAKE UP TO 3 DOSES BEFORE CALLING 911).  Yes [provider]  nystatin cream (MYCOSTATIN) Apply 1 Application topically 2 (two) times daily. Patient taking differently: Apply 1 Application topically 2 (two) times daily as needed (for itching under the breasts). 01/18/22  Yes Quentin Mulling R, PA-C  pantoprazole (PROTONIX) 40 MG tablet Take 40 mg by mouth 2 (two) times daily before a meal.   Yes [provider]  polyethylene glycol powder (GLYCOLAX/MIRALAX) powder Take 17 g by mouth daily as needed for moderate constipation.    Yes [name], MD  rosuvastatin (CRESTOR) 10 MG tablet Take 10 mg by mouth at bedtime.   Yes [provider]  tiZANidine (ZANAFLEX) 4 MG tablet Take 4 mg by mouth daily as needed for muscle spasms. 08/09/21  Yes [provider]  TYLENOL 500 MG tablet Take  500-1,500 mg by mouth 2 (two) times daily as needed for mild pain or headache.   Yes [provider]  OXYGEN Inhale 2 L/min into the lungs as needed (for shortness of breath).    [provider]  PRESCRIPTION MEDICATION See admin instructions. CPAP- At bedtime    [provider]      Allergies    Codeine    Review of Systems   Review of Systems  Physical Exam Updated Vital Signs BP 135/61   Pulse 64   Temp 98 F (36.7 C) (Oral)   Resp (!) 23   SpO2 92%  Physical Exam Vitals and nursing note reviewed.  Constitutional:      General: She is not in acute distress.    Appearance: Normal appearance. She is well-developed. She is not diaphoretic.  HENT:     Head: Normocephalic and atraumatic. No raccoon eyes or Battle's sign.     Right Ear: External ear normal.     Left Ear: External ear normal.  Eyes:     General: Lids are normal.        Right eye: No discharge.     Conjunctiva/sclera:     Right eye: No hemorrhage.    Left eye: No hemorrhage. Neck:     Trachea: No tracheal deviation.  Cardiovascular:     Rate and Rhythm: Normal rate and regular rhythm.     Heart sounds: Normal heart sounds.  Pulmonary:     Effort: Pulmonary effort is normal. No respiratory distress.     Breath sounds: Normal breath sounds. No stridor.  Chest:     Chest wall: No tenderness.  Abdominal:     General: Bowel sounds are normal. There is no distension.     Palpations: Abdomen is soft. There is no mass.     Tenderness: There is abdominal tenderness.     Comments: Tenderness in the left lower quadrant  Musculoskeletal:     Right shoulder: No tenderness or bony tenderness.     Cervical back: Tenderness present. No swelling, edema or deformity. No spinous process tenderness.     Thoracic back: No swelling, deformity or tenderness.     Lumbar back: No swelling or tenderness.     Comments: Tenderness palpation left hip, no tenderness palpation bilateral upper extremities  and lower extremities except for the left hip  Neurological:     Mental Status: She is alert.     GCS: GCS eye subscore is 4. GCS verbal subscore is 5. GCS motor subscore is 6.     Sensory: No sensory deficit.     Motor: No abnormal muscle tone.     Comments: Able to move  all extremities, sensation intact throughout  Psychiatric:        Mood and Affect: Mood normal.        Speech: Speech normal.        Behavior: Behavior normal.     ED Results / Procedures / Treatments   Labs (all labs ordered are listed, but only abnormal results are displayed) Labs Reviewed  COMPREHENSIVE METABOLIC PANEL - Abnormal; Notable for the following components:      Result Value   Glucose, Bld 111 (*)    Creatinine, Ser 1.15 (*)    Calcium 8.7 (*)    Total Protein 6.2 (*)    Albumin 3.2 (*)    GFR, Estimated 47 (*)    All other components within normal limits  CBC - Abnormal; Notable for the following components:   WBC 20.1 (*)    MCH 25.7 (*)    RDW 20.0 (*)    All other components within normal limits  URINALYSIS, W/ REFLEX TO CULTURE (INFECTION SUSPECTED) - Abnormal; Notable for the following components:   Color, Urine AMBER (*)    APPearance CLOUDY (*)    Hgb urine dipstick LARGE (*)    Protein, ur 100 (*)    Leukocytes,Ua LARGE (*)    Bacteria, UA FEW (*)    All other components within normal limits  I-STAT CHEM 8, ED - Abnormal; Notable for the following components:   Creatinine, Ser 1.10 (*)    Glucose, Bld 111 (*)    All other components within normal limits  URINE CULTURE  SAMPLE TO BLOOD BANK    EKG None  Radiology CT HEAD WO CONTRAST  Result Date: 09/30/2022 CLINICAL DATA:  Fall EXAM: CT HEAD WITHOUT CONTRAST CT CERVICAL SPINE WITHOUT CONTRAST TECHNIQUE: Multidetector CT imaging of the head and cervical spine was performed following the standard protocol without intravenous contrast. Multiplanar CT image reconstructions of the cervical spine were also generated. RADIATION  DOSE REDUCTION: This exam was performed according to the departmental dose-optimization program which includes automated exposure control, adjustment of the mA and/or kV according to patient size and/or use of iterative reconstruction technique. COMPARISON:  07/31/2022 CT head and cervical spine FINDINGS: CT HEAD FINDINGS Brain: No evidence of acute infarct, hemorrhage, mass, mass effect, or midline shift. No hydrocephalus or extra-axial fluid collection. Periventricular white matter changes, likely the sequela of chronic small vessel ischemic disease. Age related cerebral atrophy. Redemonstrated right cerebral hemisphere cortical calcifications, sequela of remote insult. Vascular: No hyperdense vessel. Skull: Negative for fracture or focal lesion. Sinuses/Orbits: No acute finding. Status post bilateral lens replacements. Other: The mastoid air cells are well aerated. CT CERVICAL SPINE FINDINGS Alignment: No traumatic listhesis. Trace retrolisthesis of C3 on C4, C4 on C5 and C5 on C6, which appears facet mediated. Straightening and mild reversal of the normal cervical lordosis in the upper cervical spine, with exaggeration of the normal cervical lordosis in the lower cervical spine. Skull base and vertebrae: No acute fracture. No primary bone lesion or focal pathologic process. Soft tissues and spinal canal: No prevertebral fluid or swelling. No visible canal hematoma. Disc levels: Degenerative changes in the cervical spine. Mild spinal canal stenosis at C3-C4, C4-C5, and C5-C6, with moderate bilateral neural foraminal narrowing at C4-C5 and C5-C6. Upper chest: Negative. IMPRESSION: 1. No acute intracranial process. 2. No acute fracture or traumatic listhesis in the cervical spine. Electronically Signed   By: Wiliam Ke M.D.   On: 09/30/2022 12:28   CT CERVICAL SPINE WO CONTRAST  Result Date: 09/30/2022 CLINICAL DATA:  Fall EXAM: CT HEAD WITHOUT CONTRAST CT CERVICAL SPINE WITHOUT CONTRAST TECHNIQUE:  Multidetector CT imaging of the head and cervical spine was performed following the standard protocol without intravenous contrast. Multiplanar CT image reconstructions of the cervical spine were also generated. RADIATION DOSE REDUCTION: This exam was performed according to the departmental dose-optimization program which includes automated exposure control, adjustment of the mA and/or kV according to patient size and/or use of iterative reconstruction technique. COMPARISON:  07/31/2022 CT head and cervical spine FINDINGS: CT HEAD FINDINGS Brain: No evidence of acute infarct, hemorrhage, mass, mass effect, or midline shift. No hydrocephalus or extra-axial fluid collection. Periventricular white matter changes, likely the sequela of chronic small vessel ischemic disease. Age related cerebral atrophy. Redemonstrated right cerebral hemisphere cortical calcifications, sequela of remote insult. Vascular: No hyperdense vessel. Skull: Negative for fracture or focal lesion. Sinuses/Orbits: No acute finding. Status post bilateral lens replacements. Other: The mastoid air cells are well aerated. CT CERVICAL SPINE FINDINGS Alignment: No traumatic listhesis. Trace retrolisthesis of C3 on C4, C4 on C5 and C5 on C6, which appears facet mediated. Straightening and mild reversal of the normal cervical lordosis in the upper cervical spine, with exaggeration of the normal cervical lordosis in the lower cervical spine. Skull base and vertebrae: No acute fracture. No primary bone lesion or focal pathologic process. Soft tissues and spinal canal: No prevertebral fluid or swelling. No visible canal hematoma. Disc levels: Degenerative changes in the cervical spine. Mild spinal canal stenosis at C3-C4, C4-C5, and C5-C6, with moderate bilateral neural foraminal narrowing at C4-C5 and C5-C6. Upper chest: Negative. IMPRESSION: 1. No acute intracranial process. 2. No acute fracture or traumatic listhesis in the cervical spine. Electronically  Signed   By: Wiliam Ke M.D.   On: 09/30/2022 12:28   CT ABDOMEN PELVIS W CONTRAST  Result Date: 09/30/2022 CLINICAL DATA:  Left lower quadrant pain.  Poly trauma. EXAM: CT ABDOMEN AND PELVIS WITH CONTRAST TECHNIQUE: Multidetector CT imaging of the abdomen and pelvis was performed using the standard protocol following bolus administration of intravenous contrast. RADIATION DOSE REDUCTION: This exam was performed according to the departmental dose-optimization program which includes automated exposure control, adjustment of the mA and/or kV according to patient size and/or use of iterative reconstruction technique. CONTRAST:  75mL OMNIPAQUE IOHEXOL 350 MG/ML SOLN COMPARISON:  08/31/2022 and 02/03/2022 FINDINGS: Lower chest: Lung bases are clear. Moderate calcified plaque over the descending thoracic aorta. Hepatobiliary: Gallbladder not visualized. Liver and biliary tree are normal. Pancreas: Normal. Spleen: 1 cm hypodensity over the inferior aspect of the spleen unchanged and likely a cyst or hemangioma. Adrenals/Urinary Tract: Adrenal glands are normal. Kidneys are normal in size. No evidence of nephrolithiasis or hydronephrosis. Kidneys are otherwise unchanged. Ureters and bladder are normal. Stomach/Bowel: Stomach and small bowel are normal. Appendix not visualized. Mild diverticulosis of the colon most prominent over the sigmoid colon. Vascular/Lymphatic: Moderate calcified plaque over the abdominal aorta which is normal in caliber. No adenopathy. Reproductive: Previous hysterectomy. Adnexal regions are unremarkable. Other: No free fluid or focal inflammatory change. Musculoskeletal: Posterior fusion hardware from L3-S1. IMPRESSION: 1. No acute findings in the abdomen/pelvis. 2. Colonic diverticulosis without evidence of acute diverticulitis. 3. 1 cm hypodensity over the inferior aspect of the spleen unchanged and likely a cyst or hemangioma. 4. Aortic atherosclerosis. Aortic Atherosclerosis  (ICD10-I70.0). Electronically Signed   By: Elberta Fortis M.D.   On: 09/30/2022 12:23   CT Hip Left Wo Contrast  Result Date: 09/30/2022  CLINICAL DATA:  Left hip pain status post fall EXAM: CT OF THE LEFT HIP WITHOUT CONTRAST TECHNIQUE: Multidetector CT imaging of the left hip was performed according to the standard protocol. Multiplanar CT image reconstructions were also generated. RADIATION DOSE REDUCTION: This exam was performed according to the departmental dose-optimization program which includes automated exposure control, adjustment of the mA and/or kV according to patient size and/or use of iterative reconstruction technique. COMPARISON:  None Available. FINDINGS: Bones/Joint/Cartilage Generalized osteopenia. No acute fracture or dislocation. Normal alignment. No joint effusion. 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 Moderate osteoarthritis of the left SI joint. Posterior lumbar interbody fusion partially visualized from L4 through S1. Ligaments Ligaments are suboptimally evaluated by CT. Muscles and Tendons Muscles are normal. No muscle atrophy. No intramuscular fluid collection or hematoma. Soft tissue No fluid collection or hematoma. No soft tissue mass. Peripheral vascular atherosclerotic disease. Diverticulosis without evidence of diverticulitis. IMPRESSION: 1. No acute osseous injury of the left hip. 2. Mild osteoarthritis of the left hip. Electronically Signed   By: Elige Ko M.D.   On: 09/30/2022 12:22   DG Shoulder Right  Result Date: 09/30/2022 CLINICAL DATA:  fall, pain EXAM: RIGHT SHOULDER - 2+ VIEW COMPARISON:  Radiograph 07/31/2022 FINDINGS: There is no evidence of acute fracture. Alignment is normal. Mild glenohumeral and moderate AC joint osteoarthritis. IMPRESSION: No acute fracture or dislocation. Mild glenohumeral and moderate AC joint osteoarthritis. Electronically Signed   By: Caprice Renshaw M.D.   On: 09/30/2022 10:53    DG Hip Unilat W or Wo Pelvis 2-3 Views Left  Result Date: 09/30/2022 CLINICAL DATA:  Fall, pain EXAM: DG HIP (WITH OR WITHOUT PELVIS) 2-3V LEFT COMPARISON:  CT 09/08/2022 FINDINGS: There is no evidence of acute fracture. Alignment is normal. There is mild osteoarthritis of the left hip. Partially visualized lumbosacral fusion hardware. IMPRESSION: No evidence of acute left hip fracture. Note that if the patient is unable to bear weight and clinical suspicion for non-displaced hip fracture is significant, CT or MRI would be more sensitive. Mild osteoarthritis of the left hip. Electronically Signed   By: Caprice Renshaw M.D.   On: 09/30/2022 10:50   DG Chest 2 View  Result Date: 09/30/2022 CLINICAL DATA:  Fall EXAM: CHEST - 2 VIEW COMPARISON:  Radiograph 07/11/2021 FINDINGS: Unchanged mildly enlarged cardiac silhouette and mild central pulmonary vascular prominence. No new airspace disease. No large effusion or evidence of pneumothorax. No acute osseous abnormality on frontal and lateral views of the chest. Lower neck surgical clips. IMPRESSION: Unchanged mildly enlarged cardiac silhouette with mild central pulmonary vascular prominence. No new airspace disease. No acute osseous abnormality on frontal and lateral views of the chest. Electronically Signed   By: Caprice Renshaw M.D.   On: 09/30/2022 10:47    Procedures Procedures    Medications Ordered in ED Medications  iohexol (OMNIPAQUE) 350 MG/ML injection 75 mL (has no administration in time range)  cefTRIAXone (ROCEPHIN) 1 g in sodium chloride 0.9 % 100 mL IVPB (has no administration in time range)  sodium chloride 0.9 % bolus 125 mL (0 mLs Intravenous Stopped 09/30/22 1120)  fentaNYL (SUBLIMAZE) injection 50 mcg (50 mcg Intravenous Given 09/30/22 1005)  iohexol (OMNIPAQUE) 350 MG/ML injection 75 mL (75 mLs Intravenous Contrast Given 09/30/22 1151)    ED Course/ Medical Decision Making/ A&P Clinical Course as of 09/30/22 1317  Fri Sep 30, 2022   1109 CBC(!) White blood cell count elevated  at 20.  Metabolic panel shows decreased protein and calcium. [JK]  1110 Shoulder x-ray without signs of fracture.  Arthritis noted [JK]  1110 Hip without signs of fracture on plain films [JK]  1300 Head CT and cervical spine CT without acute findings [JK]  1300 Abdominal pelvic CT without acute findings [JK]  1300 Mild arthritis in the left hip.  No fracture [JK]  1312 Urinalysis does suggest infection.  Patient has been having symptoms associated with UTI.  Will treat [JK]    Clinical Course User Index [JK] Linwood Dibbles, MD                             Medical Decision Making Problems Addressed: Acute cystitis without hematuria: acute illness or injury that poses a threat to life or bodily functions Fall, initial encounter: acute illness or injury that poses a threat to life or bodily functions  Amount and/or Complexity of Data Reviewed Labs: ordered. Decision-making details documented in ED Course. Radiology: ordered and independent interpretation performed.  Risk Prescription drug management.   Presented to ED for evaluation after a fall.  Patient was activated as a level 2 trauma because of her use of anticoagulants.  Patient is on apixaban.  Fortunately mental status was at baseline.  No signs of serious head injury on exam.  Patient's imaging tests are reassuring.  No signs of serious head injury cervical spine fracture.  Patient was also complaining of pain in her right shoulder and left hip.  No signs of fracture or dislocation.  CT scan was also performed to rule out occult fracture not noted on plain films and fortunately that does not show any signs of fracture.  Patient has been having trouble with abdominal pain for several weeks.  CT scan of the abdomen pelvis was performed and it does not show any evidence of serious traumatic injury or diverticulitis, lightest or mass.  Patient has been having urinary symptoms.  Her  urinalysis is consistent with infection.  She is afebrile nontoxic.  Appears stable for outpatient management.  Will discharge home with a course of antibiotics.        Final Clinical Impression(s) / ED Diagnoses Final diagnoses:  Fall, initial encounter  Acute cystitis without hematuria    Rx / DC Orders ED Discharge Orders          Ordered    cefUROXime (CEFTIN) 500 MG tablet  2 times daily with meals        09/30/22 1313              Linwood Dibbles, MD 09/30/22 1317

## 2022-09-30 NOTE — Discharge Instructions (Signed)
take over-the-counter medications as needed for aches and pains.  Take the antibiotics to treat the urinary tract infection.  Follow-up with your doctor to be rechecked to make sure the infection resolves.  Return to the hospital for fevers chills worsening symptoms

## 2022-09-30 NOTE — ED Notes (Signed)
Help get patient on the monitor got patient undressed into a gown patient is resting with call bell in reach got patient some warm blankets

## 2022-09-30 NOTE — ED Triage Notes (Signed)
Pt Bib by EMS states fell this morning, and laid in the floor for about 40 minutes before calling her son and EMS.  States thinks she may have an UTI, has an odor, unsure if she hit her head, doesn't think she hit her head, took blood thinner last night.

## 2022-10-01 LAB — URINE CULTURE

## 2022-10-02 LAB — URINE CULTURE: Culture: >=100000 — AB

## 2022-10-03 ENCOUNTER — Telehealth (HOSPITAL_BASED_OUTPATIENT_CLINIC_OR_DEPARTMENT_OTHER): Payer: Self-pay | Admitting: *Deleted

## 2022-10-03 NOTE — Telephone Encounter (Signed)
Post ED Visit - Positive Culture Follow-up  Culture report reviewed by antimicrobial stewardship pharmacist: [facility]  Enzo Bi, Pharm.D.  Celedonio Miyamoto, Pharm.D., BCPS AQ-ID  Garvin Fila, Pharm.D., BCPS  Georgina Pillion, Pharm.D., BCPS  [place], [address].D., BCPS, AAHIVP  Estella Husk, Pharm.D., BCPS, AAHIVP  Lysle Pearl, PharmD, BCPS  Phillips Climes, PharmD, BCPS  Agapito Games, PharmD, BCPS  Verlan Friends, PharmD  Mervyn Gay, PharmD, BCPS  Vinnie Level, PharmD  [facility]  Len Childs, PharmD  Greer Pickerel, PharmD  Adalberto Cole, PharmD  Perlie Gold, Rph  Lonell Face) Ann Rosenthal, PharmD  Earl Many, PharmD  Junita Push, PharmD  Dorna Leitz, PharmD  Terrilee Files, PharmD  Lynann Beaver, PharmD  Keturah Barre, PharmD  Loralee Pacas, PharmD  Bernadene Person, PharmD   Positive urine culture Treated with Cefuroxime Axetil, organism sensitive to the same and no further patient follow-up is required at this time.  Eldridge Scot, PharmD  Virl Axe Talley 10/03/2022, 10:32 AM

## 2022-10-07 DIAGNOSIS — R0902 Hypoxemia: Secondary | ICD-10-CM | POA: Diagnosis not present

## 2022-10-07 DIAGNOSIS — G4733 Obstructive sleep apnea (adult) (pediatric): Secondary | ICD-10-CM | POA: Diagnosis not present

## 2022-10-11 ENCOUNTER — Telehealth: Payer: Self-pay | Admitting: Physician Assistant

## 2022-10-11 DIAGNOSIS — R300 Dysuria: Secondary | ICD-10-CM | POA: Diagnosis not present

## 2022-10-11 DIAGNOSIS — N3090 Cystitis, unspecified without hematuria: Secondary | ICD-10-CM | POA: Diagnosis not present

## 2022-10-11 NOTE — Telephone Encounter (Signed)
Inbound call from patient states she is experiencing abdominal pain and vomiting. Please advise.

## 2022-10-11 NOTE — Telephone Encounter (Signed)
Pt states she was recently treated for a UTI and competed the medication. She was seen in the ER. [place] to [place]inate and had pain with urination and cold chills. She was wanting to know if we could send in an antibiotic. Discussed with pt that she should either call her PCP office or be seen at an urgent care to be treated. Pt verbalized understanding.

## 2022-10-13 DIAGNOSIS — M545 Low back pain, unspecified: Secondary | ICD-10-CM | POA: Diagnosis not present

## 2022-10-13 DIAGNOSIS — M533 Sacrococcygeal disorders, not elsewhere classified: Secondary | ICD-10-CM | POA: Diagnosis not present

## 2022-10-28 ENCOUNTER — Other Ambulatory Visit: Payer: Self-pay

## 2022-10-28 DIAGNOSIS — K5730 Diverticulosis of large intestine without perforation or abscess without bleeding: Secondary | ICD-10-CM

## 2022-10-28 DIAGNOSIS — R1032 Left lower quadrant pain: Secondary | ICD-10-CM

## 2022-10-28 MED ORDER — DICYCLOMINE HCL 20 MG PO TABS
20.0000 mg | ORAL_TABLET | Freq: Three times a day (TID) | ORAL | 1 refills | Status: AC | PRN
Start: 2022-10-28 — End: ?

## 2022-10-30 ENCOUNTER — Other Ambulatory Visit: Payer: Self-pay | Admitting: Cardiovascular Disease

## 2022-11-06 DIAGNOSIS — G4733 Obstructive sleep apnea (adult) (pediatric): Secondary | ICD-10-CM | POA: Diagnosis not present

## 2022-11-06 DIAGNOSIS — R0902 Hypoxemia: Secondary | ICD-10-CM | POA: Diagnosis not present

## 2022-11-09 ENCOUNTER — Other Ambulatory Visit: Payer: Self-pay | Admitting: Cardiovascular Disease

## 2022-11-14 DIAGNOSIS — I8391 Asymptomatic varicose veins of right lower extremity: Secondary | ICD-10-CM | POA: Diagnosis not present

## 2022-11-14 DIAGNOSIS — D696 Thrombocytopenia, unspecified: Secondary | ICD-10-CM | POA: Diagnosis not present

## 2022-11-14 DIAGNOSIS — N183 Chronic kidney disease, stage 3 unspecified: Secondary | ICD-10-CM | POA: Diagnosis not present

## 2022-11-14 DIAGNOSIS — J449 Chronic obstructive pulmonary disease, unspecified: Secondary | ICD-10-CM | POA: Diagnosis not present

## 2022-11-14 DIAGNOSIS — I700 Atherosclerosis of aorta: Secondary | ICD-10-CM | POA: Diagnosis not present

## 2022-11-14 DIAGNOSIS — K219 Gastro-esophageal reflux disease without esophagitis: Secondary | ICD-10-CM | POA: Diagnosis not present

## 2022-11-14 DIAGNOSIS — E7849 Other hyperlipidemia: Secondary | ICD-10-CM | POA: Diagnosis not present

## 2022-11-14 DIAGNOSIS — N1832 Chronic kidney disease, stage 3b: Secondary | ICD-10-CM | POA: Diagnosis not present

## 2022-11-14 DIAGNOSIS — E039 Hypothyroidism, unspecified: Secondary | ICD-10-CM | POA: Diagnosis not present

## 2022-11-14 DIAGNOSIS — R270 Ataxia, unspecified: Secondary | ICD-10-CM | POA: Diagnosis not present

## 2022-11-14 DIAGNOSIS — R5383 Other fatigue: Secondary | ICD-10-CM | POA: Diagnosis not present

## 2022-11-14 DIAGNOSIS — R7301 Impaired fasting glucose: Secondary | ICD-10-CM | POA: Diagnosis not present

## 2022-11-14 DIAGNOSIS — J9611 Chronic respiratory failure with hypoxia: Secondary | ICD-10-CM | POA: Diagnosis not present

## 2022-11-14 DIAGNOSIS — I10 Essential (primary) hypertension: Secondary | ICD-10-CM | POA: Diagnosis not present

## 2022-11-14 DIAGNOSIS — I25111 Atherosclerotic heart disease of native coronary artery with angina pectoris with documented spasm: Secondary | ICD-10-CM | POA: Diagnosis not present

## 2022-11-16 DIAGNOSIS — R399 Unspecified symptoms and signs involving the genitourinary system: Secondary | ICD-10-CM | POA: Diagnosis not present

## 2022-11-16 DIAGNOSIS — N390 Urinary tract infection, site not specified: Secondary | ICD-10-CM | POA: Diagnosis not present

## 2022-11-16 DIAGNOSIS — R319 Hematuria, unspecified: Secondary | ICD-10-CM | POA: Diagnosis not present

## 2022-11-28 DIAGNOSIS — Z9981 Dependence on supplemental oxygen: Secondary | ICD-10-CM | POA: Diagnosis not present

## 2022-11-28 DIAGNOSIS — J449 Chronic obstructive pulmonary disease, unspecified: Secondary | ICD-10-CM | POA: Diagnosis not present

## 2022-11-28 DIAGNOSIS — D692 Other nonthrombocytopenic purpura: Secondary | ICD-10-CM | POA: Diagnosis not present

## 2022-11-28 DIAGNOSIS — Z6829 Body mass index (BMI) 29.0-29.9, adult: Secondary | ICD-10-CM | POA: Diagnosis not present

## 2022-12-07 DIAGNOSIS — G4733 Obstructive sleep apnea (adult) (pediatric): Secondary | ICD-10-CM | POA: Diagnosis not present

## 2022-12-07 DIAGNOSIS — J449 Chronic obstructive pulmonary disease, unspecified: Secondary | ICD-10-CM | POA: Diagnosis not present

## 2022-12-07 DIAGNOSIS — R0902 Hypoxemia: Secondary | ICD-10-CM | POA: Diagnosis not present

## 2022-12-11 DIAGNOSIS — J449 Chronic obstructive pulmonary disease, unspecified: Secondary | ICD-10-CM | POA: Diagnosis not present

## 2022-12-11 DIAGNOSIS — F331 Major depressive disorder, recurrent, moderate: Secondary | ICD-10-CM | POA: Diagnosis not present

## 2022-12-11 DIAGNOSIS — E782 Mixed hyperlipidemia: Secondary | ICD-10-CM | POA: Diagnosis not present

## 2022-12-11 DIAGNOSIS — K219 Gastro-esophageal reflux disease without esophagitis: Secondary | ICD-10-CM | POA: Diagnosis not present

## 2022-12-21 ENCOUNTER — Inpatient Hospital Stay: Payer: PPO | Admitting: Hematology and Oncology

## 2022-12-21 ENCOUNTER — Inpatient Hospital Stay: Payer: PPO | Attending: Family Medicine

## 2022-12-21 ENCOUNTER — Other Ambulatory Visit: Payer: Self-pay | Admitting: Hematology and Oncology

## 2022-12-21 DIAGNOSIS — I2602 Saddle embolus of pulmonary artery with acute cor pulmonale: Secondary | ICD-10-CM

## 2022-12-21 NOTE — Progress Notes (Deleted)
[facility] [phone]   [phone]  PROGRESS NOTE  Patient Care Team: [name], MD as PCP - General (Unknown Physician Specialty) Tonny Bollman, MD as PCP - Cardiology (Cardiology)  Hematological/Oncological History # Bilateral Pulmonary Emboli # Lower Extremity DVT 07/12/2021: CT PE study showed extensive bilateral pulmonary embolus. Clot burden is high. Positive for acute PE with CT evidence of right heart strain 07/13/2021: Lower Extremity Doppler showed findings consistent with acute deep vein thrombosis involving the right posterior tibial veins 03/21/2022: establish care with Dr. Leonides Schanz   Interval History:  Stephanie Alvarado 82 y.o. female with medical history significant for bilateral PE and DVT who presents for a follow up visit. The patient's last visit was on 03/21/2022. In the interim since the last visit she has continued on eliquis therapy.  On exam today Stephanie Alvarado notes that she continues taking her Eliquis 2.5 mg twice daily.  She did unfortunately run out of her current supply last Thursday.  She did try to get a refill but did not call our office.  She notes she also did recently have a fall but fortunately did not have any bleeding.  She notes she is not otherwise having any bleeding, bruising, or dark stools.  She notes her energy levels are quite low and she has to walk with a walker.  She also uses a cane continuously at home.  Her balance has been poor.  She has had no signs or symptoms concerning for recurrent VTE such as leg swelling, leg pain, shortness of breath, or chest pain.  She is also stopped started any new medications.  She otherwise denies any fevers, chills, sweats, nausea, vomiting or diarrhea.  A full 10 point ROS was otherwise negative.  MEDICAL HISTORY:  Past Medical History:  Diagnosis Date   Achilles tendon rupture, right, initial encounter    Allergic rhinitis, cause unspecified    Anemia, unspecified     Anxiety state, unspecified    Atherosclerosis of aorta (HCC)    TEE, June, 2010, grade 4 aortic arch atherosclerosis , Dr. Shirlee Latch   CAD (coronary artery disease)    DES and circumflex 2006 /  DES to LAD 2011   Carotid artery disease (HCC)    Doppler, November, 2011, stable, 0-39% bilateral, turbulent flow left subclavian   Chronic diastolic heart failure (HCC)    Cyst of thyroid    Diverticulosis of colon (without mention of hemorrhage)    DVT (deep venous thrombosis) (HCC)    in leg   Dysphasia    Possibly esophageal stricture   Esophageal reflux    Hiatal hernia    Hyperlipemia    Hypertension    Lumbago    Lumbar disc disease    MVP (mitral valve prolapse)    Neuropathy    Nonspecific abnormal finding in stool contents    Obesity, unspecified    Osteoarthrosis, unspecified whether generalized or localized, unspecified site    Osteopenia    Other diseases of lung, not elsewhere classified    PE (pulmonary thromboembolism) (HCC)    both lungs   Peripheral vascular disease, unspecified (HCC)    Personal history of colonic polyps    ADENOMATOUS POLYP   PFO (patent foramen ovale)    Patient had a very small PFO by TEE 2010.  This was seen only by bubble analysis during Valsalva   PONV (postoperative nausea and vomiting)    Sleep apnea    uses CPAP nightly  Sleep related hypoventilation/hypoxemia in conditions classifiable elsewhere    Stroke [facility]) 11/2008   Treated with TPA? /     2010, TEE no left atrial clot, probably from atherosclerosis of the aortic arch   Subclavian artery disease (HCC)    Remote surgery by Dr. Betti Cruz.   Unspecified cerebral artery occlusion with cerebral infarction    Unspecified venous (peripheral) insufficiency    Vitamin B 12 deficiency     SURGICAL HISTORY: Past Surgical History:  Procedure Laterality Date   ACHILLES TENDON SURGERY Right 04/05/2018   Procedure: Right achilles tendon reconstruction;  Surgeon: Toni Arthurs, MD;  Location:  [facility];  Service: Orthopedics;  Laterality: Right;    BACK SURGERY     cataracts     both eyes   CHOLECYSTECTOMY     CORONARY ANGIOPLASTY WITH STENT PLACEMENT  1990's   GASTROCNEMIUS RECESSION Right 04/05/2018   Procedure: Gastroc recession;  Surgeon: Toni Arthurs, MD;  Location: [facility];  Service: Orthopedics;  Laterality: Right;   LAPAROSCOPIC HYSTERECTOMY     LUMBAR DISC SURGERY     X 3   RIGHT/LEFT HEART CATH AND CORONARY ANGIOGRAPHY N/A 04/10/2017   Procedure: RIGHT/LEFT HEART CATH AND CORONARY ANGIOGRAPHY;  Surgeon: Lyn Records, MD;  Location: [facility];  Service: Cardiovascular;  Laterality: N/A;   TOTAL KNEE ARTHROPLASTY Right 01/14/2019   Procedure: TOTAL KNEE ARTHROPLASTY;  Surgeon: Ollen Gross, MD;  Location: [facility];  Service: Orthopedics;  Laterality: Right;    TOTAL KNEE ARTHROPLASTY Left 12/23/2019   Procedure: TOTAL KNEE ARTHROPLASTY;  Surgeon: Ollen Gross, MD;  Location: [facility];  Service: Orthopedics;  Laterality: Left;    urethral suspension  1993   Dr. Jennette Kettle   VARICOSE VEIN SURGERY     x 2    SOCIAL HISTORY: Social History   Socioeconomic History   Marital status: Widowed    Spouse name: Not on file   Number of children: 4   Years of education: Not on file   Highest education level: Not on file  Occupational History   Occupation: Retired    Associate Professor: RETIRED  Tobacco Use   Smoking status: Former    Packs/day: 1.00    Years: 40.00    Additional pack years: 0.00    Total pack years: 40.00    Types: Cigarettes    Quit date: 06/13/1998    Years since quitting: 24.5   Smokeless tobacco: Never  Vaping Use   Vaping Use: Never used  Substance and Sexual Activity   Alcohol use: No    Alcohol/week: 0.0 standard drinks of alcohol   Drug use: No   Sexual activity: Not on file  Other Topics Concern   Not on file  Social History Narrative   Not on file   Social Determinants of Health    Financial Resource Strain: Not on file  Food Insecurity: Not on file  Transportation Needs: Not on file  Physical Activity: Not on file  Stress: Not on file  Social Connections: Not on file  Intimate Partner Violence: Not on file    FAMILY HISTORY: Family History  Problem Relation Age of Onset   Heart disease Father    Heart attack Father    Alzheimer's disease Mother    Colon cancer Paternal Aunt    Breast cancer Other        cousions   Esophageal cancer Neg Hx    Rectal cancer Neg Hx  Stomach cancer Neg Hx     ALLERGIES:  is allergic to codeine.  MEDICATIONS:  Current Outpatient Medications  Medication Sig Dispense Refill   ALLERGY RELIEF CETIRIZINE 10 MG tablet Take 10 mg by mouth at bedtime.     apixaban (ELIQUIS) 2.5 MG TABS tablet Take 1 tablet (2.5 mg total) by mouth 2 (two) times daily. 180 tablet 1   buPROPion (WELLBUTRIN XL) 150 MG 24 hr tablet Take 300 mg by mouth in the morning.     dicyclomine (BENTYL) 20 MG tablet Take 1 tablet (20 mg total) by mouth 3 (three) times daily as needed for spasms. 90 tablet 1   furosemide (LASIX) 40 MG tablet Take 1 tablet (40 mg total) by mouth daily. (Patient taking differently: Take 40 mg by mouth 2 (two) times a week.) 90 tablet 3   gabapentin (NEURONTIN) 300 MG capsule Take 600 mg by mouth 3 (three) times daily.      hyoscyamine (LEVSIN) 0.125 MG tablet 1-2 tabs every 4-6 hours prn 30 tablet 1   isosorbide mononitrate (IMDUR) 30 MG 24 hr tablet Take 0.5 tablets (15 mg total) by mouth at bedtime. 30 tablet 1   latanoprost (XALATAN) 0.005 % ophthalmic solution Place 1 drop into both eyes at bedtime.     lidocaine (LIDODERM) 5 % Place 1 patch onto the skin daily. Remove & Discard patch within 12 hours or as directed by MD 30 patch 0   metoprolol tartrate (LOPRESSOR) 25 MG tablet Take 1 tablet (25 mg total) by mouth 2 (two) times daily. 60 tablet 0   nitroGLYCERIN (NITROSTAT) 0.4 MG SL tablet Place 0.4 mg under the tongue every  5 (five) minutes as needed for chest pain (TAKE UP TO 3 DOSES BEFORE CALLING 911).     nystatin cream (MYCOSTATIN) Apply 1 Application topically 2 (two) times daily. (Patient taking differently: Apply 1 Application topically 2 (two) times daily as needed (for itching under the breasts).) 30 g 1   OXYGEN Inhale 2 L/min into the lungs as needed (for shortness of breath).     pantoprazole (PROTONIX) 40 MG tablet Take 40 mg by mouth 2 (two) times daily before a meal.     polyethylene glycol powder (GLYCOLAX/MIRALAX) powder Take 17 g by mouth daily as needed for moderate constipation.      PRESCRIPTION MEDICATION See admin instructions. CPAP- At bedtime     rosuvastatin (CRESTOR) 10 MG tablet Take 10 mg by mouth at bedtime.     tiZANidine (ZANAFLEX) 4 MG tablet Take 4 mg by mouth daily as needed for muscle spasms.     TYLENOL 500 MG tablet Take 500-1,500 mg by mouth 2 (two) times daily as needed for mild pain or headache.     No current facility-administered medications for this visit.    REVIEW OF SYSTEMS:   Constitutional: ( - ) fevers, ( - )  chills , ( - ) night sweats Eyes: ( - ) blurriness of vision, ( - ) double vision, ( - ) watery eyes Ears, nose, mouth, throat, and face: ( - ) mucositis, ( - ) sore throat Respiratory: ( - ) cough, ( - ) dyspnea, ( - ) wheezes Cardiovascular: ( - ) palpitation, ( - ) chest discomfort, ( - ) lower extremity swelling Gastrointestinal:  ( - ) nausea, ( - ) heartburn, ( - ) change in bowel habits Skin: ( - ) abnormal skin rashes Lymphatics: ( - ) new lymphadenopathy, ( - ) easy bruising Neurological: ( - )  numbness, ( - ) tingling, ( - ) new weaknesses Behavioral/Psych: ( - ) mood change, ( - ) new changes  All other systems were reviewed with the patient and are negative.  PHYSICAL EXAMINATION:  There were no vitals filed for this visit.  There were no vitals filed for this visit.   GENERAL: chronically ill-appearing elderly Caucasian female, alert,  no distress and comfortable SKIN: skin color, texture, turgor are normal, no rashes or significant lesions EYES: conjunctiva are pink and non-injected, sclera clear LUNGS: clear to auscultation and percussion with normal breathing effort HEART: regular rate & rhythm and no murmurs and no lower extremity edema Musculoskeletal: no cyanosis of digits and no clubbing  PSYCH: alert & oriented x 3, fluent speech NEURO: no focal motor/sensory deficits  LABORATORY DATA:  I have reviewed the data as listed    Latest Ref Rng & Units 09/30/2022    9:57 AM 09/30/2022    9:51 AM 09/08/2022   12:06 PM  CBC  WBC 4.0 - 10.5 K/uL  20.1  6.2   Hemoglobin 12.0 - 15.0 g/dL 16.1  09.6  04.5   Hematocrit 36.0 - 46.0 % 41.0  41.4  39.6   Platelets 150 - 400 K/uL  PLATELET CLUMPS NOTED ON SMEAR, UNABLE TO ESTIMATE  113.0 Repeated and verified X2.        Latest Ref Rng & Units 09/30/2022    9:57 AM 09/30/2022    9:51 AM 09/08/2022    2:45 PM  CMP  Glucose 70 - 99 mg/dL 409  811    BUN 8 - 23 mg/dL 22  20    Creatinine 9.14 - 1.00 mg/dL 7.82  9.56  2.13   Sodium 135 - 145 mmol/L 135  135    Potassium 3.5 - 5.1 mmol/L 4.4  4.2    Chloride 98 - 111 mmol/L 100  98    CO2 22 - 32 mmol/L  24    Calcium 8.9 - 10.3 mg/dL  8.7    Total Protein 6.5 - 8.1 g/dL  6.2    Total Bilirubin 0.3 - 1.2 mg/dL  1.2    Alkaline Phos 38 - 126 U/L  82    AST 15 - 41 U/L  18    ALT 0 - 44 U/L  14      RADIOGRAPHIC STUDIES: No results found.  ASSESSMENT & PLAN Stephanie Alvarado 82 y.o. female with medical history significant for bilateral PE and DVT who presents for a follow up visit.   After review of the labs, review of the records, and discussion with the patient the patients findings are most consistent with bilateral pulmonary emboli and right lower extremity DVT provoked by diverticulitis and hospitalization.   A provoked venous thromboembolism (VTE) is one that has a clear inciting factor or event. Provoking factors  include prolonged travel/immobility, surgery (particularly abdominal or orthopedic), trauma,  and pregnancy/ estrogen containing birth control. This patient was reported to have diverticulitis and prolonged hospital stay, which would qualify as a transient provoking factor. As such we would recommend 3-6 months of anticoagulation therapy with consideration of additional therapy if symptoms persist. The anticoagulation therapy of choice in this situation is continued Eliquis therapy. The patient has a supply of this medication and can afford it without difficulty. We will plan to see the patient back in 3 months time to reassess and assure they are doing well on treatment.    #Provoked DVT/Pulmonary Emboli  --findings at this  time are consistent with a provoked VTE  --will order baseline CMP and CBC to assure labs are adequate for DOAC therapy  --Due to the provoked nature of this VTE patient only requires 6 months of anticoagulation therapy.  Patient would like to continue anticoagulation and given her poor mobility and baseline lung dysfunction I think this would be reasonable to continue  --recommend the patient continue maintenance dose eliquis 2.5 mg BID (she could discontinue the medication per guidelines recommendations but prefers to continue. --patient denies any bleeding, bruising, or dark stools on this medication. It is well tolerated. No difficulties accessing/affording the medication  --Labs show white blood cell count 5.9, hemoglobin 12.6, MCV 83.6, and platelets of 125. --RTC in 6 months' time with strict return precautions for overt signs of bleeding  No orders of the defined types were placed in this encounter.   All questions were answered. The patient knows to call the clinic with any problems, questions or concerns.  A total of more than 30 minutes were spent on this encounter with face-to-face time and non-face-to-face time, including preparing to see the patient, ordering tests  and/or medications, counseling the patient and coordination of care as outlined above.   Ulysees Barns, MD Department of Hematology/Oncology [facility] Phone: [phone] Pager: [phone] Email: [email or web address]  12/21/2022 1:28 PM

## 2022-12-28 DIAGNOSIS — M461 Sacroiliitis, not elsewhere classified: Secondary | ICD-10-CM | POA: Diagnosis not present

## 2022-12-28 DIAGNOSIS — I825Z3 Chronic embolism and thrombosis of unspecified deep veins of distal lower extremity, bilateral: Secondary | ICD-10-CM | POA: Diagnosis not present

## 2022-12-28 DIAGNOSIS — I25119 Atherosclerotic heart disease of native coronary artery with unspecified angina pectoris: Secondary | ICD-10-CM | POA: Diagnosis not present

## 2022-12-28 DIAGNOSIS — E261 Secondary hyperaldosteronism: Secondary | ICD-10-CM | POA: Diagnosis not present

## 2022-12-28 DIAGNOSIS — I509 Heart failure, unspecified: Secondary | ICD-10-CM | POA: Diagnosis not present

## 2022-12-28 DIAGNOSIS — J439 Emphysema, unspecified: Secondary | ICD-10-CM | POA: Diagnosis not present

## 2022-12-28 DIAGNOSIS — I4891 Unspecified atrial fibrillation: Secondary | ICD-10-CM | POA: Diagnosis not present

## 2022-12-28 DIAGNOSIS — J9611 Chronic respiratory failure with hypoxia: Secondary | ICD-10-CM | POA: Diagnosis not present

## 2022-12-28 DIAGNOSIS — I2782 Chronic pulmonary embolism: Secondary | ICD-10-CM | POA: Diagnosis not present

## 2022-12-28 DIAGNOSIS — I110 Hypertensive heart disease with heart failure: Secondary | ICD-10-CM | POA: Diagnosis not present

## 2022-12-28 DIAGNOSIS — D6869 Other thrombophilia: Secondary | ICD-10-CM | POA: Diagnosis not present

## 2022-12-28 DIAGNOSIS — F3341 Major depressive disorder, recurrent, in partial remission: Secondary | ICD-10-CM | POA: Diagnosis not present

## 2023-01-06 DIAGNOSIS — J449 Chronic obstructive pulmonary disease, unspecified: Secondary | ICD-10-CM | POA: Diagnosis not present

## 2023-01-06 DIAGNOSIS — G4733 Obstructive sleep apnea (adult) (pediatric): Secondary | ICD-10-CM | POA: Diagnosis not present

## 2023-01-06 DIAGNOSIS — R0902 Hypoxemia: Secondary | ICD-10-CM | POA: Diagnosis not present

## 2023-01-22 ENCOUNTER — Emergency Department (HOSPITAL_BASED_OUTPATIENT_CLINIC_OR_DEPARTMENT_OTHER)
Admission: EM | Admit: 2023-01-22 | Discharge: 2023-01-22 | Disposition: A | Discharge status: Home / Self Care | Payer: PPO | Attending: Emergency Medicine | Admitting: Emergency Medicine

## 2023-01-22 ENCOUNTER — Other Ambulatory Visit: Payer: Self-pay

## 2023-01-22 ENCOUNTER — Encounter (HOSPITAL_BASED_OUTPATIENT_CLINIC_OR_DEPARTMENT_OTHER): Payer: Self-pay

## 2023-01-22 ENCOUNTER — Emergency Department (HOSPITAL_BASED_OUTPATIENT_CLINIC_OR_DEPARTMENT_OTHER): Payer: PPO | Admitting: Radiology

## 2023-01-22 ENCOUNTER — Emergency Department (HOSPITAL_BASED_OUTPATIENT_CLINIC_OR_DEPARTMENT_OTHER): Payer: PPO

## 2023-01-22 DIAGNOSIS — R079 Chest pain, unspecified: Secondary | ICD-10-CM

## 2023-01-22 DIAGNOSIS — G8929 Other chronic pain: Secondary | ICD-10-CM | POA: Diagnosis not present

## 2023-01-22 DIAGNOSIS — R1011 Right upper quadrant pain: Secondary | ICD-10-CM | POA: Diagnosis not present

## 2023-01-22 DIAGNOSIS — R109 Unspecified abdominal pain: Secondary | ICD-10-CM | POA: Diagnosis not present

## 2023-01-22 DIAGNOSIS — M546 Pain in thoracic spine: Secondary | ICD-10-CM | POA: Diagnosis not present

## 2023-01-22 DIAGNOSIS — Z981 Arthrodesis status: Secondary | ICD-10-CM | POA: Diagnosis not present

## 2023-01-22 DIAGNOSIS — Z7901 Long term (current) use of anticoagulants: Secondary | ICD-10-CM | POA: Diagnosis not present

## 2023-01-22 DIAGNOSIS — R0789 Other chest pain: Secondary | ICD-10-CM | POA: Diagnosis not present

## 2023-01-22 DIAGNOSIS — K5730 Diverticulosis of large intestine without perforation or abscess without bleeding: Secondary | ICD-10-CM | POA: Diagnosis not present

## 2023-01-22 LAB — COMPREHENSIVE METABOLIC PANEL
ALT: <5 U/L (ref 0–44)
AST: 12 U/L — ABNORMAL LOW (ref 15–41)
Albumin: 4.1 g/dL (ref 3.5–5.0)
Alkaline Phosphatase: 85 U/L (ref 38–126)
Anion gap: 9 (ref 5–15)
BUN: 12 mg/dL (ref 8–23)
CO2: 25 mmol/L (ref 22–32)
Calcium: 10.5 mg/dL — ABNORMAL HIGH (ref 8.9–10.3)
Chloride: 103 mmol/L (ref 98–111)
Creatinine, Ser: 1.17 mg/dL — ABNORMAL HIGH (ref 0.44–1.00)
GFR, Estimated: 46 mL/min — ABNORMAL LOW (ref >60)
Glucose, Bld: 105 mg/dL — ABNORMAL HIGH (ref 70–99)
Potassium: 3.9 mmol/L (ref 3.5–5.1)
Sodium: 137 mmol/L (ref 135–145)
Total Bilirubin: 0.7 mg/dL (ref 0.3–1.2)
Total Protein: 7.3 g/dL (ref 6.5–8.1)

## 2023-01-22 LAB — TROPONIN I (HIGH SENSITIVITY): Troponin I (High Sensitivity): 9 ng/L (ref <18)

## 2023-01-22 LAB — CBC
HCT: 41.5 % (ref 36.0–46.0)
Hemoglobin: 12.8 g/dL (ref 12.0–15.0)
MCH: 25.4 pg — ABNORMAL LOW (ref 26.0–34.0)
MCHC: 30.8 g/dL (ref 30.0–36.0)
MCV: 82.3 fL (ref 80.0–100.0)
Platelets: 125 10*3/uL — ABNORMAL LOW (ref 150–400)
RBC: 5.04 MIL/uL (ref 3.87–5.11)
RDW: 18.7 % — ABNORMAL HIGH (ref 11.5–15.5)
WBC: 6.6 10*3/uL (ref 4.0–10.5)
nRBC: 0 % (ref 0.0–0.2)

## 2023-01-22 LAB — LIPASE, BLOOD: Lipase: 11 U/L (ref 11–51)

## 2023-01-22 MED ORDER — LIDOCAINE 5 % EX PTCH
1.0000 | MEDICATED_PATCH | Freq: Once | CUTANEOUS | Status: DC
  Administered 2023-01-22: 1 via TRANSDERMAL
  Filled 2023-01-22: qty 1

## 2023-01-22 MED ORDER — LIDOCAINE 5 % EX PTCH: 1.0000 | MEDICATED_PATCH | CUTANEOUS | 0 refills | Status: DC

## 2023-01-22 MED ORDER — FENTANYL CITRATE PF 50 MCG/ML IJ SOSY
50.0000 ug | PREFILLED_SYRINGE | Freq: Once | INTRAMUSCULAR | Status: AC
  Administered 2023-01-22: 50 ug via INTRAVENOUS
  Filled 2023-01-22: qty 1

## 2023-01-22 MED ORDER — TRIAMCINOLONE ACETONIDE 0.1 % EX CREA: 1.0000 | TOPICAL_CREAM | Freq: Two times a day (BID) | CUTANEOUS | 0 refills | Status: DC

## 2023-01-22 MED ORDER — DIPHENHYDRAMINE HCL 25 MG PO CAPS
25.0000 mg | ORAL_CAPSULE | Freq: Once | ORAL | Status: AC
  Administered 2023-01-22: 25 mg via ORAL
  Filled 2023-01-22: qty 1

## 2023-01-22 MED ORDER — IOHEXOL 350 MG/ML SOLN
80.0000 mL | Freq: Once | INTRAVENOUS | Status: AC | PRN
  Administered 2023-01-22: 80 mL via INTRAVENOUS

## 2023-01-22 MED ORDER — ONDANSETRON HCL 4 MG/2ML IJ SOLN
4.0000 mg | Freq: Once | INTRAMUSCULAR | Status: AC
  Administered 2023-01-22: 4 mg via INTRAVENOUS
  Filled 2023-01-22: qty 2

## 2023-01-22 NOTE — ED Triage Notes (Signed)
Pt c/o RUQ abd pain that goes "straight through to the back," denies NVD, urinary symptoms. Pt also c/o R sided scapula/ "some" R chest pain  Hx chole

## 2023-01-22 NOTE — ED Notes (Signed)
EKG no performed formerly; it is being done as I write this.

## 2023-01-22 NOTE — Discharge Instructions (Addendum)
Please read and follow all provided instructions.  Your diagnoses today include:  1. Right-sided chest pain   2. Acute right-sided thoracic back pain   3. Chronic abdominal pain     Tests performed today include: An EKG of your heart A chest x-ray/CT scan of the chest: No signs of blood clot, pneumonia, or other problems which would explain the pain you are having today Cardiac enzymes - a blood test for heart muscle damage Blood counts and electrolytes Vital signs. See below for your results today.   Medications prescribed:  Lidoderm patch  Take any prescribed medications only as directed.  Follow-up instructions: Please follow-up with your primary care provider in the next 3 days for further evaluation of your symptoms.   Return instructions:  SEEK IMMEDIATE MEDICAL ATTENTION IF: You have severe chest pain, especially if the pain is crushing or pressure-like and spreads to the arms, back, neck, or jaw, or if you have sweating, nausea or vomiting, or trouble with breathing. THIS IS AN EMERGENCY. Do not wait to see if the pain will go away. Get medical help at once. Call 911. DO NOT drive yourself to the hospital.  Your chest pain gets worse and does not go away after a few minutes of rest.  You have an attack of chest pain lasting longer than what you usually experience.  You have significant dizziness, if you pass out, or have trouble walking.  You have chest pain not typical of your usual pain for which you originally saw your caregiver.  You have any other emergent concerns regarding your health.  Additional Information: Chest pain comes from many different causes. Your caregiver has diagnosed you as having chest pain that is not specific for one problem, but does not require admission.  You are at low risk for an acute heart condition or other serious illness.   Your vital signs today were: BP (!) 155/99   Pulse 92   Temp 97.8 F (36.6 C)   Resp 18   SpO2 97%  If your  blood pressure (BP) was elevated above 135/85 this visit, please have this repeated by your doctor within one month. --------------

## 2023-01-22 NOTE — ED Provider Notes (Signed)
[place] EMERGENCY DEPARTMENT AT [facility] Provider Note   CSN: [id number] Arrival date & time: 01/22/23  1524     History  Chief Complaint  Patient presents with   Back Pain   Abdominal Pain    RUQ    Stephanie Alvarado is a 82 y.o. female.  Patient with history of chronic abdominal pain, diverticulitis, cholecystectomy, fibromyalgia, pulmonary embolism on Eliquis, stroke -- presents to the emergency department for evaluation of right-sided anterior chest pain with radiation to the right scapula over the past 2 days.  Patient denies falls or injuries.  Pain is worse with certain movements and palpation.  No shortness of breath.  This is different than the patient's previous pain, although she has had some generalized abdominal pain, more so on her left side, which she has daily.  No fevers, nausea, vomiting, diarrhea.  She reports good compliance with her medications but does not think that she took her Eliquis yesterday.       Home Medications Prior to Admission medications   Medication Sig Start Date End Date Taking? Authorizing Provider  ALLERGY RELIEF CETIRIZINE 10 MG tablet Take 10 mg by mouth at bedtime. 02/14/22   [provider]  apixaban (ELIQUIS) 2.5 MG TABS tablet Take 1 tablet (2.5 mg total) by mouth 2 (two) times daily. 06/22/22   Jaci Standard, MD  buPROPion (WELLBUTRIN XL) 150 MG 24 hr tablet Take 300 mg by mouth in the morning. 10/31/15   [provider]  dicyclomine (BENTYL) 20 MG tablet Take 1 tablet (20 mg total) by mouth 3 (three) times daily as needed for spasms. 10/28/22   Doree Albee, PA-C  furosemide (LASIX) 40 MG tablet Take 1 tablet (40 mg total) by mouth daily. Patient taking differently: Take 40 mg by mouth 2 (two) times a week. 09/09/21   Tonny Bollman, MD  gabapentin (NEURONTIN) 300 MG capsule Take 600 mg by mouth 3 (three) times daily.  10/31/15   [provider]  hyoscyamine (LEVSIN) 0.125 MG tablet 1-2 tabs  every 4-6 hours prn 09/19/22   Hilarie Fredrickson, MD  isosorbide mononitrate (IMDUR) 30 MG 24 hr tablet Take 0.5 tablets (15 mg total) by mouth at bedtime. 07/19/21   Rodolph Bong, MD  latanoprost (XALATAN) 0.005 % ophthalmic solution Place 1 drop into both eyes at bedtime. 08/04/21   [provider]  lidocaine (LIDODERM) 5 % Place 1 patch onto the skin daily. Remove & Discard patch within 12 hours or as directed by MD 07/31/22   Ernie Avena, MD  metoprolol tartrate (LOPRESSOR) 25 MG tablet Take 1 tablet (25 mg total) by mouth 2 (two) times daily. 11/09/22   Tonny Bollman, MD  nitroGLYCERIN (NITROSTAT) 0.4 MG SL tablet Place 0.4 mg under the tongue every 5 (five) minutes as needed for chest pain (TAKE UP TO 3 DOSES BEFORE CALLING 911).    [provider]  nystatin cream (MYCOSTATIN) Apply 1 Application topically 2 (two) times daily. Patient taking differently: Apply 1 Application topically 2 (two) times daily as needed (for itching under the breasts). 01/18/22   Doree Albee, PA-C  OXYGEN Inhale 2 L/min into the lungs as needed (for shortness of breath).    [provider]  pantoprazole (PROTONIX) 40 MG tablet Take 40 mg by mouth 2 (two) times daily before a meal.    [provider]  polyethylene glycol powder (GLYCOLAX/MIRALAX) powder Take 17 g by mouth daily as needed for moderate constipation.  [name], MD  PRESCRIPTION MEDICATION See admin instructions. CPAP- At bedtime    [provider]  rosuvastatin (CRESTOR) 10 MG tablet Take 10 mg by mouth at bedtime.    [provider]  tiZANidine (ZANAFLEX) 4 MG tablet Take 4 mg by mouth daily as needed for muscle spasms. 08/09/21   [provider]  TYLENOL 500 MG tablet Take 500-1,500 mg by mouth 2 (two) times daily as needed for mild pain or headache.    [provider]      Allergies    Codeine    Review of Systems   Review of Systems  Physical Exam Updated  Vital Signs BP (!) 175/129 (BP Location: Left Arm)   Pulse (!) 120   Temp 97.8 F (36.6 C)   Resp (!) 22   SpO2 97%  Physical Exam Vitals and nursing note reviewed.  Constitutional:      Appearance: She is well-developed. She is not diaphoretic.  HENT:     Head: Normocephalic and atraumatic.     Mouth/Throat:     Mouth: Mucous membranes are not dry.  Eyes:     Conjunctiva/sclera: Conjunctivae normal.  Neck:     Vascular: Normal carotid pulses. No JVD.     Trachea: Trachea normal. No tracheal deviation.  Cardiovascular:     Rate and Rhythm: Normal rate and regular rhythm.     Pulses: No decreased pulses.          Radial pulses are 2+ on the right side and 2+ on the left side.     Heart sounds: Normal heart sounds, S1 normal and S2 normal. No murmur heard. Pulmonary:     Effort: Pulmonary effort is normal. No respiratory distress.     Breath sounds: No wheezing.  Chest:     Chest wall: No tenderness.     Comments: Patient does not wince when I push over the right anterior chest wall. Abdominal:     General: Bowel sounds are normal.     Palpations: Abdomen is soft.     Tenderness: There is generalized abdominal tenderness (Mild generalized tenderness). There is no guarding or rebound.  Musculoskeletal:        General: Normal range of motion.     Cervical back: Normal range of motion and neck supple. No muscular tenderness.       Back:     Comments: Patient does have mild palpable spasm over the right scapular area and she does seem to be somewhat tender with palpation in certain areas, but is difficult to readily reproduce her symptoms by palpation.  Skin:    General: Skin is warm and dry.     Coloration: Skin is not pale.  Neurological:     Mental Status: She is alert.     ED Results / Procedures / Treatments   Labs (all labs ordered are listed, but only abnormal results are displayed) Labs Reviewed  COMPREHENSIVE METABOLIC PANEL - Abnormal; Notable for the  following components:      Result Value   Glucose, Bld 105 (*)    Creatinine, Ser 1.17 (*)    Calcium 10.5 (*)    AST 12 (*)    GFR, Estimated 46 (*)    All other components within normal limits  CBC - Abnormal; Notable for the following components:   MCH 25.4 (*)    RDW 18.7 (*)    Platelets 125 (*)    All other components within normal limits  LIPASE, BLOOD  TROPONIN I (HIGH SENSITIVITY)    ED ECG REPORT   Date: 01/22/2023  Rate: 103  Rhythm: sinus tachycardia  QRS Axis: right  Intervals: normal  ST/T Wave abnormalities: normal  Conduction Disutrbances:right bundle branch block  Narrative Interpretation:   Old EKG Reviewed: changes noted, new right bundle branch block compared to previous January 2023  I have personally reviewed the EKG tracing and agree with the computerized printout as noted.   Radiology DG Chest 2 View  Result Date: 01/22/2023 CLINICAL DATA:  Right chest pain EXAM: CHEST - 2 VIEW COMPARISON:  09/30/2022 chest radiograph. FINDINGS: Surgical clips overlie the medial lower left neck. Partially visualized posterior lumbar spinal fusion hardware. Stable cardiomediastinal silhouette with normal heart size. No pneumothorax. No pleural effusion. Lungs appear clear, with no acute consolidative airspace disease and no pulmonary edema. IMPRESSION: No active cardiopulmonary disease. Electronically Signed   By: Delbert Phenix M.D.   On: 01/22/2023 16:51    Procedures Procedures    Medications Ordered in ED Medications  lidocaine (LIDODERM) 5 % 1 patch (1 patch Transdermal Patch Applied 01/22/23 1838)  fentaNYL (SUBLIMAZE) injection 50 mcg (50 mcg Intravenous Given 01/22/23 1718)  ondansetron (ZOFRAN) injection 4 mg (4 mg Intravenous Given 01/22/23 1718)  diphenhydrAMINE (BENADRYL) capsule 25 mg (25 mg Oral Given 01/22/23 1718)  iohexol (OMNIPAQUE) 350 MG/ML injection 80 mL (80 mLs Intravenous Contrast Given 01/22/23 1706)    ED Course/ Medical Decision Making/  A&P    Patient seen and examined. History obtained directly from patient. Work-up including labs, imaging, EKG ordered in triage, if performed, were reviewed.    Labs/EKG: Independently reviewed and interpreted.  This included: CBC with normal white blood cell count, normal hemoglobin, chronic thrombocytopenia noted stable platelets 125; CMP with mildly elevated creatinine at baseline, normal BUN, normal liver function testing and electrolytes; lipase normal.  Added troponin.  Awaiting EKG.  Imaging: Chest x-ray personally reviewed and interpreted, no signs of pneumothorax or infiltrate.  Medications/Fluids:  50 mcg fentanyl, Zofran 4 mg.  P.o. Benadryl 25 mg.  Most recent vital signs reviewed and are as follows: BP (!) 175/129 (BP Location: Left Arm)   Pulse (!) 120   Temp 97.8 F (36.6 C)   Resp (!) 22   SpO2 97%   Initial impression: Patient with complicated past medical history including coronary artery disease, PE.  She has had chronic abdominal pain as well.  She reports intense and sharp pain that is very uncomfortable in the right mid chest with radiation to the right scapula.  She was tachycardic on arrival.  She is on anticoagulation.  There is a chance that this is more musculoskeletal in nature, but given her history feel that it is prudent to evaluate chest, abdomen, and pelvis for recurrent PE or other findings.  Patient and family in agreement.  6:52 PM Reassessment performed. Patient appears stable.  Patient discussed with and seen by Dr. Wilkie Aye.   Labs personally reviewed and interpreted including: CBC normal white blood cell count and hemoglobin; CMP with normal electrolytes, mildly elevated creatinine at 1.17, normal liver function testing otherwise unremarkable; lipase normal.  Troponin normal at 9.  Imaging personally visualized and interpreted including: CT angio of the chest, agree no acute pulmonary findings including PE, aorta without dilation or obvious aneurysm  or dissection.  CT of the abdomen pelvis, agree no acute findings.  Reviewed pertinent lab work and imaging with patient at bedside. Questions answered.   Most current vital signs  reviewed and are as follows: BP (!) 166/88   Pulse 99   Temp 97.8 F (36.6 C)   Resp 16   SpO2 96%   Plan: D/c to home with outpatient PCP/cardiology follow-up.  Will give triamcinolone ointment for small area on the back that appears inflamed, will give prescription for Lidoderm patch.  Information for dermatology at [facility] here.                                Medical Decision Making Amount and/or Complexity of Data Reviewed Labs: ordered. Radiology: ordered.  Risk Prescription drug management.   Patient presents to the emergency department today for evaluation of atypical right-sided chest pain with radiation to the back.  She does have a complex history including PE.  Patient was evaluated today with CTA of the chest and CT of the abdomen and pelvis, fortunately without acute findings.  She has a new right bundle branch block on EKG not previously noted.  Her troponin is normal.  Abdominal labs are reassuring.  Symptoms controlled in the ED.  Treatment plan as above.  May be musculoskeletal in nature.  Also component of chronic abdominal pain, unclear etiology.  The patient's vital signs, pertinent lab work and imaging were reviewed and interpreted as discussed in the ED course. Hospitalization was considered for further testing, treatments, or serial exams/observation. However as patient is well-appearing, has a stable exam, and reassuring studies today, I do not feel that they warrant admission at this time. This plan was discussed with the patient who verbalizes agreement and comfort with this plan and seems reliable and able to return to the Emergency Department with worsening or changing symptoms.          Final Clinical Impression(s) / ED Diagnoses Final diagnoses:  Right-sided chest pain   Acute right-sided thoracic back pain  Chronic abdominal pain    Rx / DC Orders ED Discharge Orders          Ordered    lidocaine (LIDODERM) 5 %  Every 24 hours        01/22/23 1838    triamcinolone cream (KENALOG) 0.1 %  2 times daily        01/22/23 1838              Renne Crigler, PA-C 01/22/23 1855    Horton, Clabe Seal, DO 01/22/23 1902

## 2023-02-06 DIAGNOSIS — R0902 Hypoxemia: Secondary | ICD-10-CM | POA: Diagnosis not present

## 2023-02-06 DIAGNOSIS — G4733 Obstructive sleep apnea (adult) (pediatric): Secondary | ICD-10-CM | POA: Diagnosis not present

## 2023-02-06 DIAGNOSIS — J449 Chronic obstructive pulmonary disease, unspecified: Secondary | ICD-10-CM | POA: Diagnosis not present

## 2023-02-08 ENCOUNTER — Telehealth: Payer: Self-pay | Admitting: Cardiovascular Disease

## 2023-02-08 ENCOUNTER — Other Ambulatory Visit (HOSPITAL_BASED_OUTPATIENT_CLINIC_OR_DEPARTMENT_OTHER): Payer: Self-pay

## 2023-02-08 ENCOUNTER — Other Ambulatory Visit (HOSPITAL_COMMUNITY): Payer: Self-pay

## 2023-02-08 ENCOUNTER — Other Ambulatory Visit: Payer: Self-pay | Admitting: Cardiovascular Disease

## 2023-02-08 DIAGNOSIS — I2602 Saddle embolus of pulmonary artery with acute cor pulmonale: Secondary | ICD-10-CM

## 2023-02-08 MED ORDER — METOPROLOL TARTRATE 25 MG PO TABS
25.0000 mg | ORAL_TABLET | Freq: Two times a day (BID) | ORAL | 0 refills | Status: DC
  Filled 2023-02-08: qty 60, 30d supply, fill #0

## 2023-02-08 MED ORDER — APIXABAN 2.5 MG PO TABS
2.5000 mg | ORAL_TABLET | Freq: Two times a day (BID) | ORAL | 0 refills | Status: AC
Start: 2023-02-08 — End: ?
  Filled 2023-02-08: qty 180, 90d supply, fill #0
  Filled 2023-02-10: qty 60, 30d supply, fill #0
  Filled 2023-02-23 – 2023-03-10 (×2): qty 60, 30d supply, fill #1

## 2023-02-08 NOTE — Telephone Encounter (Signed)
Pt has scheduled appt with Dr Excell Seltzer on 03/13/23.  Refill sent to prevent any missed doses.

## 2023-02-08 NOTE — Telephone Encounter (Signed)
*  STAT* If patient is at the pharmacy, call can be transferred to refill team.   1. Which medications need to be refilled? (please list name of each medication and dose if known) metoprolol tartrate (LOPRESSOR) 25 MG tablet   2. Which pharmacy/location (including street and city if local pharmacy) is medication to be sent to?[place] [facility]   3. Do they need a 30 day or 90 day supply? 90 day

## 2023-02-08 NOTE — Telephone Encounter (Signed)
Prescription refill request for Eliquis received. Indication: PE Last office visit: 09/09/21 Excell Seltzer)  Scr: 1.17 (01/22/23)  Age: 82 Weight: 85.8kg  Office visit overdue. Called pt and made her aware. Transferred to schedulers to make appt.

## 2023-02-08 NOTE — Telephone Encounter (Signed)
Pt's medication was sent to pt's pharmacy as requested. Confirmation received.  °

## 2023-02-09 ENCOUNTER — Other Ambulatory Visit (HOSPITAL_COMMUNITY): Payer: Self-pay

## 2023-02-09 ENCOUNTER — Other Ambulatory Visit: Payer: Self-pay

## 2023-02-09 MED ORDER — FUROSEMIDE 40 MG PO TABS
40.0000 mg | ORAL_TABLET | Freq: Every day | ORAL | 0 refills | Status: DC
  Filled 2023-02-09: qty 30, 30d supply, fill #0

## 2023-02-09 MED ORDER — ELIQUIS 2.5 MG PO TABS
2.5000 mg | ORAL_TABLET | Freq: Two times a day (BID) | ORAL | 1 refills | Status: DC
  Filled 2023-02-09 – 2023-03-07 (×3): qty 180, 90d supply, fill #0
  Filled 2023-03-07: qty 60, 30d supply, fill #0
  Filled 2023-03-07: qty 120, 60d supply, fill #0

## 2023-02-09 MED ORDER — PANTOPRAZOLE SODIUM 40 MG PO TBEC
40.0000 mg | DELAYED_RELEASE_TABLET | Freq: Two times a day (BID) | ORAL | 3 refills | Status: DC
  Filled 2023-02-09: qty 180, 90d supply, fill #0
  Filled 2023-03-09: qty 180, 90d supply, fill #1

## 2023-02-09 MED ORDER — METOPROLOL TARTRATE 25 MG PO TABS
25.0000 mg | ORAL_TABLET | Freq: Two times a day (BID) | ORAL | 3 refills | Status: DC
  Filled 2023-02-09: qty 180, 90d supply, fill #0

## 2023-02-09 MED ORDER — ROSUVASTATIN CALCIUM 10 MG PO TABS
10.0000 mg | ORAL_TABLET | Freq: Every day | ORAL | 3 refills | Status: DC
  Filled 2023-02-09: qty 90, 90d supply, fill #0
  Filled 2023-05-08: qty 90, 90d supply, fill #1
  Filled 2023-05-15 (×2): qty 30, 30d supply, fill #1
  Filled 2023-05-31 – 2023-06-13 (×4): qty 30, 30d supply, fill #2
  Filled 2023-07-03 – 2023-07-18 (×2): qty 30, 30d supply, fill #3

## 2023-02-10 ENCOUNTER — Other Ambulatory Visit (HOSPITAL_COMMUNITY): Payer: Self-pay

## 2023-02-11 ENCOUNTER — Other Ambulatory Visit (HOSPITAL_BASED_OUTPATIENT_CLINIC_OR_DEPARTMENT_OTHER): Payer: Self-pay

## 2023-02-16 ENCOUNTER — Other Ambulatory Visit (HOSPITAL_COMMUNITY): Payer: Self-pay

## 2023-02-17 ENCOUNTER — Other Ambulatory Visit (HOSPITAL_COMMUNITY): Payer: Self-pay

## 2023-02-17 MED ORDER — PEG 3350 17 GM/SCOOP PO POWD
17.0000 g | Freq: Every day | ORAL | 6 refills | Status: DC
  Filled 2023-02-17: qty 476, 28d supply, fill #0

## 2023-02-17 MED ORDER — GEMTESA 75 MG PO TABS
75.0000 mg | ORAL_TABLET | Freq: Every day | ORAL | 3 refills | Status: DC
  Filled 2023-02-17: qty 90, 90d supply, fill #0
  Filled 2023-03-09 – 2023-05-15 (×3): qty 30, 30d supply, fill #0

## 2023-02-17 MED ORDER — LATANOPROST 0.005 % OP SOLN
1.0000 [drp] | Freq: Every day | OPHTHALMIC | 3 refills | Status: DC
  Filled 2023-02-17 – 2023-02-22 (×2): qty 7.5, 75d supply, fill #0
  Filled 2023-02-22: qty 7.5, 90d supply, fill #0

## 2023-02-17 MED ORDER — POTASSIUM CHLORIDE ER 10 MEQ PO TBCR
10.0000 meq | EXTENDED_RELEASE_TABLET | Freq: Every morning | ORAL | 0 refills | Status: DC
  Filled 2023-02-17 – 2023-02-22 (×2): qty 90, 90d supply, fill #0

## 2023-02-17 MED ORDER — TRIAMCINOLONE ACETONIDE 0.1 % EX CREA: 1.0000 | TOPICAL_CREAM | Freq: Two times a day (BID) | CUTANEOUS | 0 refills | Status: DC

## 2023-02-17 MED ORDER — HYOSCYAMINE SULFATE 0.125 MG PO TABS
0.1250 mg | ORAL_TABLET | ORAL | 1 refills | Status: DC | PRN
  Filled 2023-02-17: qty 30, 3d supply, fill #0

## 2023-02-17 MED ORDER — DICYCLOMINE HCL 20 MG PO TABS
20.0000 mg | ORAL_TABLET | Freq: Three times a day (TID) | ORAL | 1 refills | Status: DC | PRN
  Filled 2023-02-17 – 2023-02-22 (×2): qty 90, 30d supply, fill #0

## 2023-02-17 MED ORDER — ISOSORBIDE MONONITRATE ER 30 MG PO TB24
15.0000 mg | ORAL_TABLET | Freq: Every day | ORAL | 3 refills | Status: DC
  Filled 2023-02-17 – 2023-02-22 (×2): qty 45, 90d supply, fill #0
  Filled 2023-05-15: qty 45, 90d supply, fill #1
  Filled 2023-05-15: qty 45, 90d supply, fill #0
  Filled 2023-05-15: qty 45, 90d supply, fill #1
  Filled 2023-05-18: qty 45, 90d supply, fill #0
  Filled 2023-06-20: qty 45, 90d supply, fill #1

## 2023-02-18 ENCOUNTER — Other Ambulatory Visit: Payer: Self-pay

## 2023-02-18 ENCOUNTER — Other Ambulatory Visit (HOSPITAL_COMMUNITY): Payer: Self-pay

## 2023-02-20 ENCOUNTER — Other Ambulatory Visit (HOSPITAL_COMMUNITY): Payer: Self-pay

## 2023-02-21 ENCOUNTER — Other Ambulatory Visit (HOSPITAL_COMMUNITY): Payer: Self-pay

## 2023-02-21 ENCOUNTER — Other Ambulatory Visit: Payer: Self-pay

## 2023-02-22 ENCOUNTER — Other Ambulatory Visit (HOSPITAL_COMMUNITY): Payer: Self-pay

## 2023-02-22 ENCOUNTER — Other Ambulatory Visit: Payer: Self-pay

## 2023-02-23 ENCOUNTER — Other Ambulatory Visit (HOSPITAL_COMMUNITY): Payer: Self-pay

## 2023-03-07 ENCOUNTER — Other Ambulatory Visit: Payer: Self-pay | Admitting: Cardiovascular Disease

## 2023-03-07 ENCOUNTER — Encounter: Payer: Self-pay | Admitting: Pharmacist

## 2023-03-07 ENCOUNTER — Other Ambulatory Visit (HOSPITAL_BASED_OUTPATIENT_CLINIC_OR_DEPARTMENT_OTHER): Payer: Self-pay

## 2023-03-07 ENCOUNTER — Other Ambulatory Visit (HOSPITAL_COMMUNITY): Payer: Self-pay

## 2023-03-07 ENCOUNTER — Other Ambulatory Visit: Payer: Self-pay

## 2023-03-08 ENCOUNTER — Other Ambulatory Visit: Payer: Self-pay

## 2023-03-08 ENCOUNTER — Other Ambulatory Visit (HOSPITAL_COMMUNITY): Payer: Self-pay

## 2023-03-08 MED ORDER — METOPROLOL TARTRATE 25 MG PO TABS
25.0000 mg | ORAL_TABLET | Freq: Two times a day (BID) | ORAL | 0 refills | Status: DC
  Filled 2023-03-08: qty 60, 30d supply, fill #0

## 2023-03-08 MED ORDER — POTASSIUM CHLORIDE ER 10 MEQ PO TBCR
10.0000 meq | EXTENDED_RELEASE_TABLET | Freq: Every morning | ORAL | 0 refills | Status: DC
  Filled 2023-03-08 – 2023-05-15 (×3): qty 30, 30d supply, fill #0

## 2023-03-08 MED ORDER — FUROSEMIDE 40 MG PO TABS
40.0000 mg | ORAL_TABLET | Freq: Every day | ORAL | 0 refills | Status: DC
  Filled 2023-03-08: qty 30, 30d supply, fill #0

## 2023-03-09 ENCOUNTER — Other Ambulatory Visit (HOSPITAL_COMMUNITY): Payer: Self-pay

## 2023-03-09 ENCOUNTER — Other Ambulatory Visit: Payer: Self-pay

## 2023-03-09 ENCOUNTER — Encounter: Payer: Self-pay | Admitting: Pharmacist

## 2023-03-09 DIAGNOSIS — R0902 Hypoxemia: Secondary | ICD-10-CM | POA: Diagnosis not present

## 2023-03-09 DIAGNOSIS — J449 Chronic obstructive pulmonary disease, unspecified: Secondary | ICD-10-CM | POA: Diagnosis not present

## 2023-03-09 DIAGNOSIS — N183 Chronic kidney disease, stage 3 unspecified: Secondary | ICD-10-CM | POA: Diagnosis not present

## 2023-03-09 DIAGNOSIS — D692 Other nonthrombocytopenic purpura: Secondary | ICD-10-CM | POA: Diagnosis not present

## 2023-03-09 DIAGNOSIS — G4733 Obstructive sleep apnea (adult) (pediatric): Secondary | ICD-10-CM | POA: Diagnosis not present

## 2023-03-10 ENCOUNTER — Other Ambulatory Visit: Payer: Self-pay

## 2023-03-10 ENCOUNTER — Other Ambulatory Visit (HOSPITAL_COMMUNITY): Payer: Self-pay

## 2023-03-12 NOTE — Assessment & Plan Note (Signed)
Pt stable with no angina on current med Rx. Continue rosuvastatin, imdur, metoprolol. No antiplatelet Rx in setting of anticoagulation with apixaban.

## 2023-03-12 NOTE — Progress Notes (Unsigned)
Cardiology Office Note:  .   Date:  03/12/2023  ID:  Stephanie Alvarado, DOB Sep 14, 1940, MRN [id number] PCP: [name], MD  [facility] Providers Cardiologist:  Tonny Bollman, MD    History of Present Illness: .   Stephanie Alvarado is a 82 y.o. female with hx of coronary artery disease, presenting for follow-up evaluation.  The patient CAD dates back to 2006 when she underwent PCI of the right coronary artery with a Zomax study stent.  In 2009 she developed recurrent symptoms and was found to have progressive stenosis in the proximal LAD, treated with a Promus drug-eluting stent at that time.  A nuclear stress scan in 2017 showed no significant ischemia.  Her last echocardiogram demonstrated normal LV systolic function with mild diastolic dysfunction.   ROS: ***  Studies Reviewed: .        ***  Medications: Current Outpatient Medications  Medication Sig Dispense Refill   ALLERGY RELIEF CETIRIZINE 10 MG tablet Take 10 mg by mouth at bedtime.     apixaban (ELIQUIS) 2.5 MG TABS tablet Take 1 tablet (2.5 mg total) by mouth 2 (two) times daily. 180 tablet 0   apixaban (ELIQUIS) 2.5 MG TABS tablet Take 1 tablet (2.5 mg total) by mouth 2 (two) times daily. 180 tablet 1   buPROPion (WELLBUTRIN XL) 150 MG 24 hr tablet Take 300 mg by mouth in the morning.     dicyclomine (BENTYL) 20 MG tablet Take 1 tablet (20 mg total) by mouth 3 (three) times daily as needed for spasms. 90 tablet 1   dicyclomine (BENTYL) 20 MG tablet Take 1 tablet (20 mg total) by mouth 3 (three) times daily as needed for muscle spasms. 90 tablet 1   furosemide (LASIX) 40 MG tablet Take 1 tablet (40 mg total) by mouth daily. Must keep upcoming appointment in September with Dr. Excell Seltzer for further refills. 30 tablet 0   gabapentin (NEURONTIN) 300 MG capsule Take 600 mg by mouth 3 (three) times daily.      hyoscyamine (LEVSIN) 0.125 MG tablet 1-2 tabs every 4-6 hours prn 30 tablet 1   hyoscyamine (LEVSIN) 0.125 MG tablet Take  1-2 tablets (0.125-0.25 mg total) by mouth every 4 (four) to 6 (six) hours as needed. 30 tablet 1   isosorbide mononitrate (IMDUR) 30 MG 24 hr tablet Take 0.5 tablets (15 mg total) by mouth at bedtime. 30 tablet 1   isosorbide mononitrate (IMDUR) 30 MG 24 hr tablet Take 0.5 tablets (15 mg total) by mouth at bedtime. 45 tablet 3   latanoprost (XALATAN) 0.005 % ophthalmic solution Place 1 drop into both eyes at bedtime.     latanoprost (XALATAN) 0.005 % ophthalmic solution Place 1 drop into both eyes at bedtime. 7.5 mL 3   lidocaine (LIDODERM) 5 % Place 1 patch onto the skin daily. Remove & Discard patch within 12 hours or as directed by MD 14 patch 0   metoprolol tartrate (LOPRESSOR) 25 MG tablet Take 1 tablet (25 mg total) by mouth 2 (two) times daily. 60 tablet 0   nitroGLYCERIN (NITROSTAT) 0.4 MG SL tablet Place 0.4 mg under the tongue every 5 (five) minutes as needed for chest pain (TAKE UP TO 3 DOSES BEFORE CALLING 911).     nystatin cream (MYCOSTATIN) Apply 1 Application topically 2 (two) times daily. (Patient taking differently: Apply 1 Application topically 2 (two) times daily as needed (for itching under the breasts).) 30 g 1   OXYGEN Inhale 2 L/min into  the lungs as needed (for shortness of breath).     pantoprazole (PROTONIX) 40 MG tablet Take 40 mg by mouth 2 (two) times daily before a meal.     pantoprazole (PROTONIX) 40 MG tablet Take 1 tablet (40 mg total) by mouth 2 (two) times daily with breakfast and bedtime 180 tablet 3   Polyethylene Glycol 3350 (PEG 3350) 17 GM/SCOOP POWD Mix 17 g (1 capful) in 8oz of liquid and drink by mouth daily for constipation. 510 g 6   polyethylene glycol powder (GLYCOLAX/MIRALAX) powder Take 17 g by mouth daily as needed for moderate constipation.      potassium chloride (KLOR-CON) 10 MEQ tablet Take 1 tablet (10 mEq total) by mouth in the morning. 30 tablet 0   PRESCRIPTION MEDICATION See admin instructions. CPAP- At bedtime     rosuvastatin (CRESTOR) 10  MG tablet Take 10 mg by mouth at bedtime.     rosuvastatin (CRESTOR) 10 MG tablet Take 1 tablet (10 mg total) by mouth at bedtime. 90 tablet 3   tiZANidine (ZANAFLEX) 4 MG tablet Take 4 mg by mouth daily as needed for muscle spasms.     triamcinolone cream (KENALOG) 0.1 % Apply 1 Application topically 2 (two) times daily. 30 g 0   triamcinolone cream (KENALOG) 0.1 % Apply 1 Application topically 2 (two) times daily. 30 g 0   TYLENOL 500 MG tablet Take 500-1,500 mg by mouth 2 (two) times daily as needed for mild pain or headache.     Vibegron (GEMTESA) 75 MG TABS Take 1 tablet (75 mg total) by mouth daily. 90 tablet 3   No current facility-administered medications for this visit.    Risk Assessment/Calculations:   {Does this patient have ATRIAL FIBRILLATION?:[id number]} No BP recorded.  {Refresh Note OR Click here to enter BP  :1}***       Physical Exam:   VS:  There were no vitals taken for this visit.   Wt Readings from Last 3 Encounters:  09/08/22 189 lb 2 oz (85.8 kg)  07/31/22 194 lb (88 kg)  06/22/22 194 lb 8 oz (88.2 kg)    GEN: Well nourished, well developed in no acute distress NECK: No JVD; No carotid bruits CARDIAC: ***RRR, no murmurs, rubs, gallops RESPIRATORY:  Clear to auscultation without rales, wheezing or rhonchi  ABDOMEN: Soft, non-tender, non-distended EXTREMITIES:  No edema; No deformity   ASSESSMENT AND PLAN: .   No problem-specific Assessment & Plan notes found for this encounter.     {Are you ordering a CV Procedure (e.g. stress test, cath, DCCV, TEE, etc)?   Press F2        :[id number]}  Dispo: ***  Signed, Tonny Bollman, MD

## 2023-03-13 ENCOUNTER — Ambulatory Visit: Payer: PPO | Attending: Cardiovascular Disease | Admitting: Cardiovascular Disease

## 2023-03-13 ENCOUNTER — Encounter: Payer: Self-pay | Admitting: Cardiovascular Disease

## 2023-03-13 ENCOUNTER — Other Ambulatory Visit: Payer: Self-pay

## 2023-03-13 VITALS — BP 134/60 | HR 65 | Ht 68.0 in | Wt 191.8 lb

## 2023-03-13 DIAGNOSIS — I2510 Atherosclerotic heart disease of native coronary artery without angina pectoris: Secondary | ICD-10-CM

## 2023-03-13 DIAGNOSIS — I5032 Chronic diastolic (congestive) heart failure: Secondary | ICD-10-CM

## 2023-03-13 DIAGNOSIS — E782 Mixed hyperlipidemia: Secondary | ICD-10-CM | POA: Diagnosis not present

## 2023-03-13 MED ORDER — ASPIRIN 81 MG PO TBEC: 81.0000 mg | DELAYED_RELEASE_TABLET | Freq: Every day | ORAL | 3 refills | Status: DC

## 2023-03-13 NOTE — Patient Instructions (Signed)
Medication Instructions:  STOP Eliquis START Aspirin 81mg  daily *If you need a refill on your cardiac medications before your next appointment, please call your pharmacy*   Lab Work: NONE If you have labs (blood work) drawn today and your tests are completely normal, you will receive your results only by: MyChart Message (if you have MyChart) OR A paper copy in the mail If you have any lab test that is abnormal or we need to change your treatment, we will call you to review the results.   Testing/Procedures: NONE   Follow-Up: At [facility], you and your health needs are our priority.  As part of our continuing mission to provide you with exceptional heart care, we have created designated Provider Care Teams.  These Care Teams include your primary Cardiologist (physician) and Advanced Practice Providers (APPs -  Physician Assistants and Nurse Practitioners) who all work together to provide you with the care you need, when you need it.  We recommend signing up for the patient portal called "MyChart".  Sign up information is provided on this After Visit Summary.  MyChart is used to connect with patients for Virtual Visits (Telemedicine).  Patients are able to view lab/test results, encounter notes, upcoming appointments, etc.  Non-urgent messages can be sent to your provider as well.   To learn more about what you can do with MyChart, go to [email or web address].    Your next appointment:   1 year(s)  Provider:   Tonny Bollman, MD

## 2023-03-20 DIAGNOSIS — I10 Essential (primary) hypertension: Secondary | ICD-10-CM | POA: Diagnosis not present

## 2023-03-20 DIAGNOSIS — J9611 Chronic respiratory failure with hypoxia: Secondary | ICD-10-CM | POA: Diagnosis not present

## 2023-03-20 DIAGNOSIS — G319 Degenerative disease of nervous system, unspecified: Secondary | ICD-10-CM | POA: Diagnosis not present

## 2023-03-20 DIAGNOSIS — R7303 Prediabetes: Secondary | ICD-10-CM | POA: Diagnosis not present

## 2023-03-20 DIAGNOSIS — I8391 Asymptomatic varicose veins of right lower extremity: Secondary | ICD-10-CM | POA: Diagnosis not present

## 2023-03-20 DIAGNOSIS — I700 Atherosclerosis of aorta: Secondary | ICD-10-CM | POA: Diagnosis not present

## 2023-03-20 DIAGNOSIS — R270 Ataxia, unspecified: Secondary | ICD-10-CM | POA: Diagnosis not present

## 2023-03-20 DIAGNOSIS — E7849 Other hyperlipidemia: Secondary | ICD-10-CM | POA: Diagnosis not present

## 2023-03-20 DIAGNOSIS — N1832 Chronic kidney disease, stage 3b: Secondary | ICD-10-CM | POA: Diagnosis not present

## 2023-03-20 DIAGNOSIS — E039 Hypothyroidism, unspecified: Secondary | ICD-10-CM | POA: Diagnosis not present

## 2023-03-20 DIAGNOSIS — I25111 Atherosclerotic heart disease of native coronary artery with angina pectoris with documented spasm: Secondary | ICD-10-CM | POA: Diagnosis not present

## 2023-03-20 DIAGNOSIS — E559 Vitamin D deficiency, unspecified: Secondary | ICD-10-CM | POA: Diagnosis not present

## 2023-04-04 ENCOUNTER — Other Ambulatory Visit (HOSPITAL_COMMUNITY): Payer: Self-pay

## 2023-04-04 ENCOUNTER — Other Ambulatory Visit: Payer: Self-pay | Admitting: Cardiovascular Disease

## 2023-04-04 ENCOUNTER — Other Ambulatory Visit: Payer: Self-pay

## 2023-04-04 MED ORDER — METOPROLOL TARTRATE 25 MG PO TABS: 25.0000 mg | ORAL_TABLET | Freq: Two times a day (BID) | ORAL | 3 refills | Status: DC

## 2023-04-04 MED ORDER — FUROSEMIDE 40 MG PO TABS
40.0000 mg | ORAL_TABLET | Freq: Every day | ORAL | 3 refills | Status: DC
  Filled 2023-04-04: qty 90, 90d supply, fill #0
  Filled 2023-05-15: qty 30, 30d supply, fill #1
  Filled 2023-05-31 – 2023-06-09 (×3): qty 90, 90d supply, fill #1
  Filled 2023-06-13: qty 30, 30d supply, fill #1
  Filled 2023-07-03 – 2023-07-18 (×2): qty 30, 30d supply, fill #2

## 2023-04-07 ENCOUNTER — Encounter (HOSPITAL_COMMUNITY): Payer: Self-pay | Admitting: *Deleted

## 2023-04-07 ENCOUNTER — Other Ambulatory Visit (HOSPITAL_COMMUNITY): Payer: Self-pay

## 2023-04-07 ENCOUNTER — Ambulatory Visit (HOSPITAL_COMMUNITY)
Admission: EM | Admit: 2023-04-07 | Discharge: 2023-04-07 | Disposition: A | Discharge status: Home / Self Care | Payer: PPO | Attending: Family Medicine | Admitting: Family Medicine

## 2023-04-07 DIAGNOSIS — R072 Precordial pain: Secondary | ICD-10-CM

## 2023-04-07 DIAGNOSIS — L299 Pruritus, unspecified: Secondary | ICD-10-CM

## 2023-04-07 MED ORDER — PERMETHRIN 5 % EX CREA: TOPICAL_CREAM | CUTANEOUS | 0 refills | Status: DC

## 2023-04-07 MED ORDER — ARIPIPRAZOLE 2 MG PO TABS
2.0000 mg | ORAL_TABLET | Freq: Every evening | ORAL | 6 refills | Status: DC
  Filled 2023-04-07 – 2023-04-15 (×3): qty 30, 30d supply, fill #0
  Filled 2023-05-08 – 2023-05-15 (×3): qty 30, 30d supply, fill #1
  Filled 2023-05-31 – 2023-06-13 (×4): qty 30, 30d supply, fill #2
  Filled 2023-07-03 – 2023-07-18 (×2): qty 30, 30d supply, fill #3

## 2023-04-07 NOTE — ED Triage Notes (Addendum)
Pt states that she she has bug and parasites all over her and coming out of her skin. She states she has been ithcing a few days but she seen a warm that looked like a maggot coming from under her skin today. She said that the eggs are coming out too. She states that she also has them coming out of her eye.    She states she was having chest pain about an hour ago. She states the pain resolved but she was worried.

## 2023-04-07 NOTE — ED Provider Notes (Signed)
[facility]    CSN: [id number] Arrival date & time: 04/07/23  1913      History   Chief Complaint Chief Complaint  Patient presents with   Pruritis   Chest Pain    HPI Stephanie Alvarado is a 82 y.o. female.    Chest Pain Here because she thinks she has gotten a skin malady from her son. Of note her son has had painful red eyes.  She states that she is having eggs come out of her eyes and oozing out of her skin, namely on her arms and legs.  She also notes some itching  Also earlier today she had about a 5-minute spell of central chest pain.  She is not having it anymore and she is not having any shortness of breath.      Past Medical History:  Diagnosis Date   Achilles tendon rupture, right, initial encounter    Allergic rhinitis, cause unspecified    Anemia, unspecified    Anxiety state, unspecified    Atherosclerosis of aorta (HCC)    TEE, June, 2010, grade 4 aortic arch atherosclerosis , Dr. Shirlee Latch   CAD (coronary artery disease)    DES and circumflex 2006 /  DES to LAD 2011   Carotid artery disease (HCC)    Doppler, November, 2011, stable, 0-39% bilateral, turbulent flow left subclavian   Chronic diastolic heart failure (HCC)    Cyst of thyroid    Diverticulosis of colon (without mention of hemorrhage)    DVT (deep venous thrombosis) (HCC)    in leg   Dysphasia    Possibly esophageal stricture   Esophageal reflux    Hiatal hernia    Hyperlipemia    Hypertension    Lumbago    Lumbar disc disease    MVP (mitral valve prolapse)    Neuropathy    Nonspecific abnormal finding in stool contents    Obesity, unspecified    Osteoarthrosis, unspecified whether generalized or localized, unspecified site    Osteopenia    Other diseases of lung, not elsewhere classified    PE (pulmonary thromboembolism) (HCC)    both lungs   Peripheral vascular disease, unspecified (HCC)    Personal history of colonic polyps    ADENOMATOUS POLYP   PFO (patent  foramen ovale)    Patient had a very small PFO by TEE 2010.  This was seen only by bubble analysis during Valsalva   PONV (postoperative nausea and vomiting)    Sleep apnea    uses CPAP nightly   Sleep related hypoventilation/hypoxemia in conditions classifiable elsewhere    Stroke [facility]) 11/2008   Treated with TPA? /     2010, TEE no left atrial clot, probably from atherosclerosis of the aortic arch   Subclavian artery disease (HCC)    Remote surgery by Dr. Betti Cruz.   Unspecified cerebral artery occlusion with cerebral infarction    Unspecified venous (peripheral) insufficiency    Vitamin B 12 deficiency     Patient Active Problem List   Diagnosis Date Noted   Nocturnal hypoxemia 11/11/2021   Opacity of lung on imaging study 07/13/2021   Acute saddle pulmonary embolism (HCC) 07/12/2021   Acute on chronic respiratory failure with hypoxia (HCC) 07/12/2021   Chest pain 07/11/2021   Daytime sleepiness 07/10/2021   AKI (acute kidney injury) (HCC) 07/08/2021   Chronic diastolic CHF (congestive heart failure) (HCC) 07/07/2021   Thrombocytopenia (HCC) 07/07/2021   Chronic respiratory failure with hypoxia, on home oxygen  therapy (HCC) 07/07/2021   Hypokalemia 07/07/2021   Primary osteoarthritis of left knee 12/23/2019   Diarrhea 09/06/2017   Chronic RLQ pain 05/26/2017   Lightheadedness    Ataxia 05/30/2016   Dizziness 05/30/2016   Preoperative clearance 11/10/2015   Varicose veins of left lower extremity with complications 05/11/2015   Varicose veins of leg with complications 10/21/2014   Lumbar scoliosis 11/21/2013   Preop cardiovascular exam 10/29/2013   LLQ abdominal pain 04/05/2013   Diverticulitis 04/05/2013   Palpitations    Subclavian artery disease (HCC)    PFO (patent foramen ovale)    CAD (coronary artery disease)    Ejection fraction    Atherosclerosis of aorta (HCC)    Coronary artery disease involving native coronary artery of native heart without angina pectoris     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   Chronic constipation 06/11/2010   ABDOMINAL PAIN-LLQ 06/11/2010   History of adenomatous polyp of colon 06/11/2010   OSA (obstructive sleep apnea) 07/17/2009   Stroke (HCC) 11/11/2008   COLONIC POLYPS 08/31/2008   Thyroid nodule 08/31/2008   VENOUS INSUFFICIENCY 08/31/2008   Diverticulosis of colon 08/31/2008   LOW BACK PAIN, CHRONIC 08/31/2008   FIBROMYALGIA 08/31/2008   Dyspnea 08/08/2008   HYPERCHOLESTEROLEMIA 04/19/2007   OBESITY 04/19/2007   ANEMIA 04/19/2007   ANXIETY 04/19/2007   Essential hypertension 04/19/2007   ALLERGIC RHINITIS 04/19/2007   PULMONARY NODULE, RIGHT LOWER LOBE 04/19/2007   GERD 04/19/2007   OA (osteoarthritis) of knee 04/19/2007    Past Surgical History:  Procedure Laterality Date   ACHILLES TENDON SURGERY Right 04/05/2018   Procedure: Right achilles tendon reconstruction;  Surgeon: Toni Arthurs, MD;  Location: [facility];  Service: Orthopedics;  Laterality: Right;    BACK SURGERY     cataracts     both eyes   CHOLECYSTECTOMY     CORONARY ANGIOPLASTY WITH STENT PLACEMENT  1990's   GASTROCNEMIUS RECESSION Right 04/05/2018   Procedure: Gastroc recession;  Surgeon: Toni Arthurs, MD;  Location: [facility];  Service: Orthopedics;  Laterality: Right;   LAPAROSCOPIC HYSTERECTOMY     LUMBAR DISC SURGERY     X 3   RIGHT/LEFT HEART CATH AND CORONARY ANGIOGRAPHY N/A 04/10/2017   Procedure: RIGHT/LEFT HEART CATH AND CORONARY ANGIOGRAPHY;  Surgeon: Lyn Records, MD;  Location: [facility];  Service: Cardiovascular;  Laterality: N/A;   TOTAL KNEE ARTHROPLASTY Right 01/14/2019   Procedure: TOTAL KNEE ARTHROPLASTY;  Surgeon: Ollen Gross, MD;  Location: [facility];  Service: Orthopedics;  Laterality: Right;    TOTAL KNEE ARTHROPLASTY Left 12/23/2019   Procedure: TOTAL KNEE ARTHROPLASTY;  Surgeon: Ollen Gross, MD;  Location: [facility];  Service: Orthopedics;  Laterality:  Left;    urethral suspension  1993   Dr. Jennette Kettle   VARICOSE VEIN SURGERY     x 2    OB History   No obstetric history on file.      Home Medications    Prior to Admission medications   Medication Sig Start Date End Date Taking? Authorizing Provider  ALLERGY RELIEF CETIRIZINE 10 MG tablet Take 10 mg by mouth at bedtime. 02/14/22  Yes [provider]  aspirin EC 81 MG tablet Take 1 tablet (81 mg total) by mouth daily. Swallow whole. 03/13/23  Yes Tonny Bollman, MD  buPROPion (WELLBUTRIN XL) 150 MG 24 hr tablet Take 300 mg by mouth in the morning. 10/31/15  Yes [provider]  dicyclomine (BENTYL) 20 MG  tablet Take 1 tablet (20 mg total) by mouth 3 (three) times daily as needed for muscle spasms. 10/28/22  Yes Quentin Mulling R, PA-C  furosemide (LASIX) 40 MG tablet Take 1 tablet (40 mg total) by mouth daily. 04/04/23  Yes Tonny Bollman, MD  gabapentin (NEURONTIN) 300 MG capsule Take 600 mg by mouth 3 (three) times daily.  10/31/15  Yes [provider]  hyoscyamine (LEVSIN) 0.125 MG tablet 1-2 tabs every 4-6 hours prn 09/19/22  Yes Hilarie Fredrickson, MD  isosorbide mononitrate (IMDUR) 30 MG 24 hr tablet Take 0.5 tablets (15 mg total) by mouth at bedtime. 02/09/23  Yes   latanoprost (XALATAN) 0.005 % ophthalmic solution Place 1 drop into both eyes at bedtime. 08/04/21  Yes [provider]  lidocaine (LIDODERM) 5 % Place 1 patch onto the skin daily. Remove & Discard patch within 12 hours or as directed by MD 01/22/23  Yes Renne Crigler, PA-C  metoprolol tartrate (LOPRESSOR) 25 MG tablet Take 1 tablet (25 mg total) by mouth 2 (two) times daily. 04/04/23  Yes Tonny Bollman, MD  nystatin cream (MYCOSTATIN) Apply 1 Application topically 2 (two) times daily. Patient taking differently: Apply 1 Application topically 2 (two) times daily as needed (for itching under the breasts). 01/18/22  Yes Quentin Mulling R, PA-C  pantoprazole (PROTONIX) 40 MG tablet Take 40 mg by  mouth 2 (two) times daily before a meal.   Yes [provider]  permethrin (ELIMITE) 5 % cream Apply to affected area once 04/07/23  Yes Jasyah Theurer, Janace Aris, MD  Polyethylene Glycol 3350 (PEG 3350) 17 GM/SCOOP POWD Mix 17 g (1 capful) in 8oz of liquid and drink by mouth daily for constipation. 11/11/22  Yes   polyethylene glycol powder (GLYCOLAX/MIRALAX) powder Take 17 g by mouth daily as needed for moderate constipation.    Yes [name], MD  potassium chloride (KLOR-CON) 10 MEQ tablet Take 1 tablet (10 mEq total) by mouth in the morning. 03/08/23  Yes Tonny Bollman, MD  PRESCRIPTION MEDICATION See admin instructions. CPAP- At bedtime   Yes [provider]  rosuvastatin (CRESTOR) 10 MG tablet Take 1 tablet (10 mg total) by mouth at bedtime. 02/09/23  Yes   triamcinolone cream (KENALOG) 0.1 % Apply 1 Application topically 2 (two) times daily. 01/22/23  Yes Renne Crigler, PA-C  TYLENOL 500 MG tablet Take 500-1,500 mg by mouth 2 (two) times daily as needed for mild pain or headache.   Yes [provider]  Vibegron (GEMTESA) 75 MG TABS Take 1 tablet (75 mg total) by mouth daily. 12/12/22  Yes   nitroGLYCERIN (NITROSTAT) 0.4 MG SL tablet Place 0.4 mg under the tongue every 5 (five) minutes as needed for chest pain (TAKE UP TO 3 DOSES BEFORE CALLING 911).    [provider]  OXYGEN Inhale 2 L/min into the lungs as needed (for shortness of breath).    [provider]  tiZANidine (ZANAFLEX) 4 MG tablet Take 4 mg by mouth daily as needed for muscle spasms. 08/09/21   [provider]    Family History Family History  Problem Relation Age of Onset   Heart disease Father    Heart attack Father    Alzheimer's disease Mother    Colon cancer Paternal Aunt    Breast cancer Other        cousions   Esophageal cancer Neg Hx    Rectal cancer Neg Hx    Stomach cancer Neg Hx     Social  History Social History   Tobacco Use   Smoking status: Former     Current packs/day: 0.00    Average packs/day: 1 pack/day for 40.0 years (40.0 ttl pk-yrs)    Types: Cigarettes    Start date: 06/13/1958    Quit date: 06/13/1998    Years since quitting: 24.8   Smokeless tobacco: Never  Vaping Use   Vaping status: Never Used  Substance Use Topics   Alcohol use: No    Alcohol/week: 0.0 standard drinks of alcohol   Drug use: No     Allergies   Codeine   Review of Systems Review of Systems  Cardiovascular:  Positive for chest pain.     Physical Exam Triage Vital Signs ED Triage Vitals  Encounter Vitals Group     BP 04/07/23 1938 (!) 236/88     Systolic BP Percentile --      Diastolic BP Percentile --      Pulse Rate 04/07/23 1938 97     Resp 04/07/23 1938 18     Temp 04/07/23 1938 97.8 F (36.6 C)     Temp Source 04/07/23 1938 Oral     SpO2 04/07/23 1938 95 %     Weight --      Height --      Head Circumference --      Peak Flow --      Pain Score 04/07/23 1935 0     Pain Loc --      Pain Education --      Exclude from Growth Chart --    No data found.  Updated Vital Signs BP (!) 201/96 (BP Location: Left Arm)   Pulse 97   Temp 97.8 F (36.6 C) (Oral)   Resp 18   SpO2 95%   Visual Acuity Right Eye Distance:   Left Eye Distance:   Bilateral Distance:    Right Eye Near:   Left Eye Near:    Bilateral Near:     Physical Exam Vitals reviewed.  Constitutional:      General: She is not in acute distress.    Appearance: She is not ill-appearing, toxic-appearing or diaphoretic.  HENT:     Mouth/Throat:     Mouth: Mucous membranes are moist.  Eyes:     Extraocular Movements: Extraocular movements intact.     Conjunctiva/sclera: Conjunctivae normal.     Pupils: Pupils are equal, round, and reactive to light.     Comments: There is no discharge or any kind of foreign body at her inner canthus or in her eyes.  Cardiovascular:     Rate and Rhythm: Normal rate and regular rhythm.     Heart sounds: No murmur  heard. Pulmonary:     Effort: Pulmonary effort is normal.     Breath sounds: Normal breath sounds.  Musculoskeletal:     Cervical back: Neck supple.  Lymphadenopathy:     Cervical: No cervical adenopathy.  Skin:    Coloration: Skin is not jaundiced or pale.     Comments: I do not see any rash.  I see some dry skin flakes on her shins.  There is also a little bit of bruising on her forearms.  I do not see any erythema.   Neurological:     Mental Status: She is alert and oriented to person, place, and time.  Psychiatric:        Behavior: Behavior normal.      UC Treatments / Results  Labs (all labs  ordered are listed, but only abnormal results are displayed) Labs Reviewed - No data to display  EKG   Radiology No results found.  Procedures Procedures (including critical care time)  Medications Ordered in UC Medications - No data to display  Initial Impression / Assessment and Plan / UC Course  I have reviewed the triage vital signs and the nursing notes.  Pertinent labs & imaging results that were available during my care of the patient were reviewed by me and considered in my medical decision making (see chart for details).       The patient states that her primary care was going to send her in permethrin but they did not get to before they left the office for the weekend.  I have tried to discuss with her that I do not see any abnormality that needs treatment.  However I decided to treat with the permethrin.  I have asked her not to apply it to her face or eyes.  She will then follow-up with her primary care for further evaluation and treatment.  EKG shows a PVC.  Otherwise there is no serious pathology. Final Clinical Impressions(s) / UC Diagnoses   Final diagnoses:  Itching  Precordial pain     Discharge Instructions      Apply the permethrin cream neck down and leave on for 8 hours overnight.  Please do not apply this to your face or eyes.  Please  follow-up with your primary care about this issue.    ED Prescriptions     Medication Sig Dispense Auth. Provider   permethrin (ELIMITE) 5 % cream Apply to affected area once 60 g Silus Lanzo, Janace Aris, MD      PDMP not reviewed this encounter.   Zenia Resides, MD 04/07/23 2034

## 2023-04-07 NOTE — ED Notes (Addendum)
Heart rate from 54-97 when taking vitals. BP taken twice elevated.

## 2023-04-07 NOTE — Discharge Instructions (Addendum)
Apply the permethrin cream neck down and leave on for 8 hours overnight.  Please do not apply this to your face or eyes.  Please follow-up with your primary care about this issue.

## 2023-04-08 ENCOUNTER — Other Ambulatory Visit (HOSPITAL_COMMUNITY): Payer: Self-pay

## 2023-04-08 DIAGNOSIS — J449 Chronic obstructive pulmonary disease, unspecified: Secondary | ICD-10-CM | POA: Diagnosis not present

## 2023-04-08 DIAGNOSIS — F22 Delusional disorders: Secondary | ICD-10-CM | POA: Diagnosis not present

## 2023-04-08 DIAGNOSIS — R0902 Hypoxemia: Secondary | ICD-10-CM | POA: Diagnosis not present

## 2023-04-08 DIAGNOSIS — G4733 Obstructive sleep apnea (adult) (pediatric): Secondary | ICD-10-CM | POA: Diagnosis not present

## 2023-04-10 ENCOUNTER — Other Ambulatory Visit: Payer: Self-pay

## 2023-04-13 ENCOUNTER — Other Ambulatory Visit: Payer: Self-pay

## 2023-04-13 ENCOUNTER — Other Ambulatory Visit (HOSPITAL_COMMUNITY): Payer: Self-pay

## 2023-04-14 ENCOUNTER — Other Ambulatory Visit (HOSPITAL_COMMUNITY): Payer: Self-pay

## 2023-04-14 ENCOUNTER — Other Ambulatory Visit: Payer: Self-pay

## 2023-04-15 ENCOUNTER — Other Ambulatory Visit (HOSPITAL_COMMUNITY): Payer: Self-pay

## 2023-04-19 ENCOUNTER — Other Ambulatory Visit: Payer: Self-pay

## 2023-04-19 ENCOUNTER — Other Ambulatory Visit (HOSPITAL_COMMUNITY): Payer: Self-pay

## 2023-04-25 DIAGNOSIS — B370 Candidal stomatitis: Secondary | ICD-10-CM | POA: Diagnosis not present

## 2023-04-25 DIAGNOSIS — R21 Rash and other nonspecific skin eruption: Secondary | ICD-10-CM | POA: Diagnosis not present

## 2023-04-25 DIAGNOSIS — I10 Essential (primary) hypertension: Secondary | ICD-10-CM | POA: Diagnosis not present

## 2023-04-25 DIAGNOSIS — F2089 Other schizophrenia: Secondary | ICD-10-CM | POA: Diagnosis not present

## 2023-05-04 ENCOUNTER — Telehealth: Payer: Self-pay | Admitting: Cardiovascular Disease

## 2023-05-04 NOTE — Telephone Encounter (Signed)
Pt c/o medication issue:  1. Name of Medication: metoprolol tartrate (LOPRESSOR) 25 MG tablet   2. How are you currently taking this medication (dosage and times per day)?    3. Are you having a reaction (difficulty breathing--STAT)? no  4. What is your medication issue? Patient calling in to see if she needs to double this medication. States that her bp has been exteremly high 200/190. Please advise

## 2023-05-04 NOTE — Telephone Encounter (Signed)
Left a message for the pt to call back.  

## 2023-05-08 ENCOUNTER — Encounter: Payer: Self-pay | Admitting: Pharmacist

## 2023-05-08 ENCOUNTER — Other Ambulatory Visit: Payer: Self-pay

## 2023-05-08 ENCOUNTER — Other Ambulatory Visit (HOSPITAL_COMMUNITY): Payer: Self-pay

## 2023-05-08 MED ORDER — METOPROLOL TARTRATE 50 MG PO TABS
50.0000 mg | ORAL_TABLET | Freq: Two times a day (BID) | ORAL | 3 refills | Status: DC
  Filled 2023-05-08 (×2): qty 180, 90d supply, fill #0
  Filled 2023-05-31 – 2023-07-18 (×3): qty 180, 90d supply, fill #1

## 2023-05-08 NOTE — Telephone Encounter (Signed)
On the smart watch her BP is always perfect.   At urgent care her BP has been elevated twice.  Went last week when she fell - and it was so high they wouldn't let him leave until it came down.   She started back on her own taking Lopressor 50 mg BID.  BP seems to be petfect now again 120s/78.  HR this am 92 while on the phone w me.  She is going to keep an eye on bp with the watch, and compare it to her home auto cuff from time to time.

## 2023-05-08 NOTE — Telephone Encounter (Signed)
Called pt and made her aware of Cooper's recommendation.

## 2023-05-08 NOTE — Telephone Encounter (Signed)
I'm fine to keep her on metoprolol 50 mg BID since her BP is well-controlled on that dose. thx

## 2023-05-09 ENCOUNTER — Other Ambulatory Visit: Payer: Self-pay

## 2023-05-09 DIAGNOSIS — R0902 Hypoxemia: Secondary | ICD-10-CM | POA: Diagnosis not present

## 2023-05-09 DIAGNOSIS — G4733 Obstructive sleep apnea (adult) (pediatric): Secondary | ICD-10-CM | POA: Diagnosis not present

## 2023-05-09 DIAGNOSIS — J449 Chronic obstructive pulmonary disease, unspecified: Secondary | ICD-10-CM | POA: Diagnosis not present

## 2023-05-10 ENCOUNTER — Other Ambulatory Visit: Payer: Self-pay

## 2023-05-12 DIAGNOSIS — N3281 Overactive bladder: Secondary | ICD-10-CM | POA: Diagnosis not present

## 2023-05-12 DIAGNOSIS — F22 Delusional disorders: Secondary | ICD-10-CM | POA: Diagnosis not present

## 2023-05-12 DIAGNOSIS — N1832 Chronic kidney disease, stage 3b: Secondary | ICD-10-CM | POA: Diagnosis not present

## 2023-05-12 DIAGNOSIS — R7301 Impaired fasting glucose: Secondary | ICD-10-CM | POA: Diagnosis not present

## 2023-05-12 DIAGNOSIS — E039 Hypothyroidism, unspecified: Secondary | ICD-10-CM | POA: Diagnosis not present

## 2023-05-12 DIAGNOSIS — E782 Mixed hyperlipidemia: Secondary | ICD-10-CM | POA: Diagnosis not present

## 2023-05-15 ENCOUNTER — Other Ambulatory Visit: Payer: Self-pay

## 2023-05-15 ENCOUNTER — Other Ambulatory Visit (HOSPITAL_COMMUNITY): Payer: Self-pay

## 2023-05-15 ENCOUNTER — Other Ambulatory Visit (HOSPITAL_BASED_OUTPATIENT_CLINIC_OR_DEPARTMENT_OTHER): Payer: Self-pay

## 2023-05-16 ENCOUNTER — Other Ambulatory Visit: Payer: Self-pay

## 2023-05-16 ENCOUNTER — Other Ambulatory Visit (HOSPITAL_COMMUNITY): Payer: Self-pay

## 2023-05-18 ENCOUNTER — Other Ambulatory Visit (HOSPITAL_COMMUNITY): Payer: Self-pay

## 2023-05-18 ENCOUNTER — Other Ambulatory Visit (HOSPITAL_BASED_OUTPATIENT_CLINIC_OR_DEPARTMENT_OTHER): Payer: Self-pay

## 2023-05-19 ENCOUNTER — Other Ambulatory Visit (HOSPITAL_COMMUNITY): Payer: Self-pay

## 2023-05-19 ENCOUNTER — Other Ambulatory Visit (HOSPITAL_BASED_OUTPATIENT_CLINIC_OR_DEPARTMENT_OTHER): Payer: Self-pay

## 2023-05-22 ENCOUNTER — Other Ambulatory Visit: Payer: Self-pay

## 2023-05-22 ENCOUNTER — Other Ambulatory Visit: Payer: Self-pay | Admitting: Physical Medicine and Rehabilitation

## 2023-05-22 DIAGNOSIS — M546 Pain in thoracic spine: Secondary | ICD-10-CM

## 2023-05-28 ENCOUNTER — Other Ambulatory Visit: Payer: Self-pay

## 2023-05-28 ENCOUNTER — Emergency Department (HOSPITAL_COMMUNITY): Payer: PPO

## 2023-05-28 ENCOUNTER — Inpatient Hospital Stay (HOSPITAL_COMMUNITY)
Admission: EM | Admit: 2023-05-28 | Discharge: 2023-06-01 | DRG: 193 | Disposition: A | Discharge status: Home Health | Payer: PPO | Attending: Internal Medicine | Admitting: Internal Medicine

## 2023-05-28 ENCOUNTER — Encounter (HOSPITAL_COMMUNITY): Payer: Self-pay

## 2023-05-28 DIAGNOSIS — Z96652 Presence of left artificial knee joint: Secondary | ICD-10-CM | POA: Diagnosis not present

## 2023-05-28 DIAGNOSIS — Z20822 Contact with and (suspected) exposure to covid-19: Secondary | ICD-10-CM | POA: Diagnosis present

## 2023-05-28 DIAGNOSIS — G629 Polyneuropathy, unspecified: Secondary | ICD-10-CM | POA: Diagnosis not present

## 2023-05-28 DIAGNOSIS — M1611 Unilateral primary osteoarthritis, right hip: Secondary | ICD-10-CM | POA: Diagnosis not present

## 2023-05-28 DIAGNOSIS — Z7982 Long term (current) use of aspirin: Secondary | ICD-10-CM

## 2023-05-28 DIAGNOSIS — W19XXXA Unspecified fall, initial encounter: Secondary | ICD-10-CM | POA: Diagnosis not present

## 2023-05-28 DIAGNOSIS — I2510 Atherosclerotic heart disease of native coronary artery without angina pectoris: Secondary | ICD-10-CM | POA: Diagnosis present

## 2023-05-28 DIAGNOSIS — Z9181 History of falling: Secondary | ICD-10-CM

## 2023-05-28 DIAGNOSIS — Z820 Family history of epilepsy and other diseases of the nervous system: Secondary | ICD-10-CM

## 2023-05-28 DIAGNOSIS — R0602 Shortness of breath: Secondary | ICD-10-CM | POA: Diagnosis not present

## 2023-05-28 DIAGNOSIS — J9601 Acute respiratory failure with hypoxia: Secondary | ICD-10-CM | POA: Diagnosis not present

## 2023-05-28 DIAGNOSIS — M48061 Spinal stenosis, lumbar region without neurogenic claudication: Secondary | ICD-10-CM | POA: Diagnosis present

## 2023-05-28 DIAGNOSIS — D696 Thrombocytopenia, unspecified: Secondary | ICD-10-CM | POA: Diagnosis present

## 2023-05-28 DIAGNOSIS — E785 Hyperlipidemia, unspecified: Secondary | ICD-10-CM | POA: Diagnosis not present

## 2023-05-28 DIAGNOSIS — E538 Deficiency of other specified B group vitamins: Secondary | ICD-10-CM | POA: Diagnosis present

## 2023-05-28 DIAGNOSIS — M419 Scoliosis, unspecified: Secondary | ICD-10-CM | POA: Diagnosis present

## 2023-05-28 DIAGNOSIS — Z96653 Presence of artificial knee joint, bilateral: Secondary | ICD-10-CM | POA: Diagnosis present

## 2023-05-28 DIAGNOSIS — Z86718 Personal history of other venous thrombosis and embolism: Secondary | ICD-10-CM

## 2023-05-28 DIAGNOSIS — Z803 Family history of malignant neoplasm of breast: Secondary | ICD-10-CM

## 2023-05-28 DIAGNOSIS — Q2112 Patent foramen ovale: Secondary | ICD-10-CM | POA: Diagnosis not present

## 2023-05-28 DIAGNOSIS — Z96651 Presence of right artificial knee joint: Secondary | ICD-10-CM | POA: Diagnosis not present

## 2023-05-28 DIAGNOSIS — Z860100 Personal history of colon polyps, unspecified: Secondary | ICD-10-CM

## 2023-05-28 DIAGNOSIS — I110 Hypertensive heart disease with heart failure: Secondary | ICD-10-CM | POA: Diagnosis present

## 2023-05-28 DIAGNOSIS — Z955 Presence of coronary angioplasty implant and graft: Secondary | ICD-10-CM | POA: Diagnosis not present

## 2023-05-28 DIAGNOSIS — F32A Depression, unspecified: Secondary | ICD-10-CM | POA: Diagnosis present

## 2023-05-28 DIAGNOSIS — F419 Anxiety disorder, unspecified: Secondary | ICD-10-CM | POA: Diagnosis present

## 2023-05-28 DIAGNOSIS — M25561 Pain in right knee: Secondary | ICD-10-CM | POA: Diagnosis not present

## 2023-05-28 DIAGNOSIS — Z9049 Acquired absence of other specified parts of digestive tract: Secondary | ICD-10-CM

## 2023-05-28 DIAGNOSIS — I700 Atherosclerosis of aorta: Secondary | ICD-10-CM | POA: Diagnosis present

## 2023-05-28 DIAGNOSIS — Z8673 Personal history of transient ischemic attack (TIA), and cerebral infarction without residual deficits: Secondary | ICD-10-CM

## 2023-05-28 DIAGNOSIS — Z885 Allergy status to narcotic agent status: Secondary | ICD-10-CM | POA: Diagnosis not present

## 2023-05-28 DIAGNOSIS — Z8249 Family history of ischemic heart disease and other diseases of the circulatory system: Secondary | ICD-10-CM

## 2023-05-28 DIAGNOSIS — R0902 Hypoxemia: Secondary | ICD-10-CM | POA: Diagnosis not present

## 2023-05-28 DIAGNOSIS — Z79899 Other long term (current) drug therapy: Secondary | ICD-10-CM

## 2023-05-28 DIAGNOSIS — I5032 Chronic diastolic (congestive) heart failure: Secondary | ICD-10-CM | POA: Diagnosis not present

## 2023-05-28 DIAGNOSIS — I517 Cardiomegaly: Secondary | ICD-10-CM | POA: Diagnosis not present

## 2023-05-28 DIAGNOSIS — J9621 Acute and chronic respiratory failure with hypoxia: Secondary | ICD-10-CM | POA: Diagnosis present

## 2023-05-28 DIAGNOSIS — K219 Gastro-esophageal reflux disease without esophagitis: Secondary | ICD-10-CM | POA: Diagnosis present

## 2023-05-28 DIAGNOSIS — I10 Essential (primary) hypertension: Secondary | ICD-10-CM | POA: Diagnosis present

## 2023-05-28 DIAGNOSIS — G4733 Obstructive sleep apnea (adult) (pediatric): Secondary | ICD-10-CM | POA: Diagnosis not present

## 2023-05-28 DIAGNOSIS — Z87891 Personal history of nicotine dependence: Secondary | ICD-10-CM | POA: Diagnosis not present

## 2023-05-28 DIAGNOSIS — Z471 Aftercare following joint replacement surgery: Secondary | ICD-10-CM | POA: Diagnosis not present

## 2023-05-28 DIAGNOSIS — M25551 Pain in right hip: Secondary | ICD-10-CM | POA: Diagnosis not present

## 2023-05-28 DIAGNOSIS — Z86711 Personal history of pulmonary embolism: Secondary | ICD-10-CM

## 2023-05-28 DIAGNOSIS — M25562 Pain in left knee: Secondary | ICD-10-CM | POA: Diagnosis not present

## 2023-05-28 DIAGNOSIS — K5909 Other constipation: Secondary | ICD-10-CM | POA: Diagnosis not present

## 2023-05-28 DIAGNOSIS — Z800 Family history of malignant neoplasm of digestive organs: Secondary | ICD-10-CM

## 2023-05-28 DIAGNOSIS — J189 Pneumonia, unspecified organism: Secondary | ICD-10-CM | POA: Diagnosis not present

## 2023-05-28 LAB — RESP PANEL BY RT-PCR (RSV, FLU A&B, COVID)  RVPGX2
Influenza A by PCR: NEGATIVE
Influenza B by PCR: NEGATIVE
Resp Syncytial Virus by PCR: NEGATIVE
SARS Coronavirus 2 by RT PCR: NEGATIVE

## 2023-05-28 LAB — I-STAT CG4 LACTIC ACID, ED
Lactic Acid, Venous: 1.5 mmol/L (ref 0.5–1.9)
Lactic Acid, Venous: 1.6 mmol/L (ref 0.5–1.9)

## 2023-05-28 LAB — BASIC METABOLIC PANEL
Anion gap: 6 (ref 5–15)
BUN: 20 mg/dL (ref 8–23)
CO2: 28 mmol/L (ref 22–32)
Calcium: 9.3 mg/dL (ref 8.9–10.3)
Chloride: 100 mmol/L (ref 98–111)
Creatinine, Ser: 1.01 mg/dL — ABNORMAL HIGH (ref 0.44–1.00)
GFR, Estimated: 55 mL/min — ABNORMAL LOW (ref >60)
Glucose, Bld: 102 mg/dL — ABNORMAL HIGH (ref 70–99)
Potassium: 4 mmol/L (ref 3.5–5.1)
Sodium: 134 mmol/L — ABNORMAL LOW (ref 135–145)

## 2023-05-28 LAB — CBC WITH DIFFERENTIAL/PLATELET
Abs Immature Granulocytes: 0.13 10*3/uL — ABNORMAL HIGH (ref 0.00–0.07)
Basophils Absolute: 0.1 10*3/uL (ref 0.0–0.1)
Basophils Relative: 1 %
Eosinophils Absolute: 0.1 10*3/uL (ref 0.0–0.5)
Eosinophils Relative: 2 %
HCT: 37.9 % (ref 36.0–46.0)
Hemoglobin: 11.6 g/dL — ABNORMAL LOW (ref 12.0–15.0)
Immature Granulocytes: 2 %
Lymphocytes Relative: 9 %
Lymphs Abs: 0.6 10*3/uL — ABNORMAL LOW (ref 0.7–4.0)
MCH: 26.3 pg (ref 26.0–34.0)
MCHC: 30.6 g/dL (ref 30.0–36.0)
MCV: 85.9 fL (ref 80.0–100.0)
Monocytes Absolute: 0.3 10*3/uL (ref 0.1–1.0)
Monocytes Relative: 5 %
Neutro Abs: 5.3 10*3/uL (ref 1.7–7.7)
Neutrophils Relative %: 81 %
Platelets: UNDETERMINED 10*3/uL (ref 150–400)
RBC: 4.41 MIL/uL (ref 3.87–5.11)
RDW: 20.3 % — ABNORMAL HIGH (ref 11.5–15.5)
WBC: 6.5 10*3/uL (ref 4.0–10.5)
nRBC: 0 % (ref 0.0–0.2)

## 2023-05-28 LAB — EXPECTORATED SPUTUM ASSESSMENT W GRAM STAIN, RFLX TO RESP C

## 2023-05-28 LAB — BRAIN NATRIURETIC PEPTIDE: B Natriuretic Peptide: 306.9 pg/mL — ABNORMAL HIGH (ref 0.0–100.0)

## 2023-05-28 MED ORDER — SODIUM CHLORIDE 0.9 % IV SOLN
1.0000 g | INTRAVENOUS | Status: AC
  Administered 2023-05-29 – 2023-06-01 (×4): 1 g via INTRAVENOUS
  Filled 2023-05-28 (×4): qty 10

## 2023-05-28 MED ORDER — ASPIRIN 81 MG PO TBEC
81.0000 mg | DELAYED_RELEASE_TABLET | Freq: Every day | ORAL | Status: DC
  Administered 2023-05-29 – 2023-05-30 (×2): 81 mg via ORAL
  Filled 2023-05-28 (×2): qty 1

## 2023-05-28 MED ORDER — PANTOPRAZOLE SODIUM 40 MG PO TBEC
40.0000 mg | DELAYED_RELEASE_TABLET | Freq: Two times a day (BID) | ORAL | Status: DC
  Administered 2023-05-28 – 2023-06-01 (×8): 40 mg via ORAL
  Filled 2023-05-28 (×8): qty 1

## 2023-05-28 MED ORDER — ALBUTEROL SULFATE (2.5 MG/3ML) 0.083% IN NEBU
2.5000 mg | INHALATION_SOLUTION | RESPIRATORY_TRACT | Status: DC | PRN
  Administered 2023-05-28: 2.5 mg via RESPIRATORY_TRACT
  Filled 2023-05-28: qty 3

## 2023-05-28 MED ORDER — SODIUM CHLORIDE 0.9 % IV SOLN
500.0000 mg | INTRAVENOUS | Status: DC
  Administered 2023-05-29 – 2023-05-31 (×3): 500 mg via INTRAVENOUS
  Filled 2023-05-28 (×4): qty 5

## 2023-05-28 MED ORDER — GABAPENTIN 300 MG PO CAPS
600.0000 mg | ORAL_CAPSULE | Freq: Three times a day (TID) | ORAL | Status: DC
  Administered 2023-05-28 – 2023-06-01 (×12): 600 mg via ORAL
  Filled 2023-05-28 (×12): qty 2

## 2023-05-28 MED ORDER — METOPROLOL TARTRATE 50 MG PO TABS
50.0000 mg | ORAL_TABLET | Freq: Two times a day (BID) | ORAL | Status: DC
  Administered 2023-05-28 – 2023-06-01 (×8): 50 mg via ORAL
  Filled 2023-05-28 (×8): qty 1

## 2023-05-28 MED ORDER — ROSUVASTATIN CALCIUM 10 MG PO TABS
10.0000 mg | ORAL_TABLET | Freq: Every day | ORAL | Status: DC
  Administered 2023-05-28 – 2023-05-31 (×4): 10 mg via ORAL
  Filled 2023-05-28 (×4): qty 1

## 2023-05-28 MED ORDER — ISOSORBIDE MONONITRATE ER 30 MG PO TB24
15.0000 mg | ORAL_TABLET | Freq: Every day | ORAL | Status: DC
  Administered 2023-05-28 – 2023-05-31 (×4): 15 mg via ORAL
  Filled 2023-05-28 (×4): qty 1

## 2023-05-28 MED ORDER — LATANOPROST 0.005 % OP SOLN
1.0000 [drp] | Freq: Every day | OPHTHALMIC | Status: DC
  Administered 2023-05-29 – 2023-05-31 (×3): 1 [drp] via OPHTHALMIC
  Filled 2023-05-28: qty 2.5

## 2023-05-28 MED ORDER — ACETAMINOPHEN 325 MG PO TABS: 650.0000 mg | ORAL_TABLET | Freq: Four times a day (QID) | ORAL | Status: DC | PRN

## 2023-05-28 MED ORDER — ACETAMINOPHEN 650 MG RE SUPP: 650.0000 mg | Freq: Four times a day (QID) | RECTAL | Status: DC | PRN

## 2023-05-28 MED ORDER — ARIPIPRAZOLE 2 MG PO TABS
2.0000 mg | ORAL_TABLET | Freq: Every evening | ORAL | Status: DC
  Administered 2023-05-28 – 2023-05-31 (×4): 2 mg via ORAL
  Filled 2023-05-28 (×5): qty 1

## 2023-05-28 MED ORDER — ONDANSETRON HCL 4 MG PO TABS: 4.0000 mg | ORAL_TABLET | Freq: Four times a day (QID) | ORAL | Status: DC | PRN

## 2023-05-28 MED ORDER — ONDANSETRON HCL 4 MG/2ML IJ SOLN
4.0000 mg | Freq: Four times a day (QID) | INTRAMUSCULAR | Status: DC | PRN
Start: 2023-05-28 — End: 2023-06-01

## 2023-05-28 MED ORDER — IPRATROPIUM-ALBUTEROL 0.5-2.5 (3) MG/3ML IN SOLN
3.0000 mL | Freq: Three times a day (TID) | RESPIRATORY_TRACT | Status: DC
  Administered 2023-05-28 – 2023-05-29 (×2): 3 mL via RESPIRATORY_TRACT
  Filled 2023-05-28 (×2): qty 3

## 2023-05-28 MED ORDER — SODIUM CHLORIDE 0.9 % IV SOLN
1.0000 g | Freq: Once | INTRAVENOUS | Status: AC
  Administered 2023-05-28: 1 g via INTRAVENOUS
  Filled 2023-05-28: qty 10

## 2023-05-28 MED ORDER — DICYCLOMINE HCL 20 MG PO TABS: 20.0000 mg | ORAL_TABLET | Freq: Three times a day (TID) | ORAL | Status: DC | PRN

## 2023-05-28 MED ORDER — SODIUM CHLORIDE 0.9 % IV SOLN: 500.0000 mg | Freq: Once | INTRAVENOUS | Status: DC

## 2023-05-28 MED ORDER — POLYETHYLENE GLYCOL 3350 17 G PO PACK: 17.0000 g | PACK | Freq: Every day | ORAL | Status: DC | PRN

## 2023-05-28 MED ORDER — SODIUM CHLORIDE 0.9 % IV BOLUS
1000.0000 mL | Freq: Once | INTRAVENOUS | Status: AC
  Administered 2023-05-28: 1000 mL via INTRAVENOUS

## 2023-05-28 MED ORDER — ACETAMINOPHEN 325 MG PO TABS
650.0000 mg | ORAL_TABLET | Freq: Once | ORAL | Status: AC
  Administered 2023-05-28: 650 mg via ORAL
  Filled 2023-05-28: qty 2

## 2023-05-28 NOTE — Plan of Care (Signed)
  Problem: Clinical Measurements: Goal: Ability to maintain a body temperature in the normal range will improve Outcome: Progressing   Problem: Respiratory: Goal: Ability to maintain adequate ventilation will improve Outcome: Progressing Goal: Ability to maintain a clear airway will improve Outcome: Progressing   Problem: Respiratory: Goal: Ability to maintain a clear airway will improve Outcome: Progressing   Problem: Education: Goal: Knowledge of General Education information will improve Description: Including pain rating scale, medication(s)/side effects and non-pharmacologic comfort measures Outcome: Progressing

## 2023-05-28 NOTE — Progress Notes (Signed)
Sputum culture obtained, spontaneous cough, pt coughed up moderate, thick, pale amts of secretions. Sent to lab.

## 2023-05-28 NOTE — H&P (Signed)
History and Physical    Patient: Stephanie Alvarado NGE:[id number] DOB: June 02, 1941 DOA: 05/28/2023 DOS: the patient was seen and examined on 05/28/2023 PCP: [name], MD  Patient coming from: Home  Chief Complaint:  Chief Complaint  Patient presents with   Shortness of Breath   Hypoxemia   HPI: Stephanie Alvarado is a 82 y.o. female with medical history significant of Achilles tendon rupture, allergic rhinitis, unspecified anemia, anxiety, aortic atherosclerosis, CAD, carotid artery disease, chronic diastolic heart failure, thyroid cyst, diverticulosis, history of DVT and pulmonary embolism, dysphagia, esophageal stricture, GERD, hiatal hernia, hyperlipidemia, hypertension, lumbar disc disease, mitral valve prolapse, peripheral neuropathy, unspecified obesity, osteoarthrosis, osteopenia, patent foramen ovale, peripheral vascular disease, sleep apnea on CPAP, history of stroke treated with tPA, vitamin B12 deficiency who presented to the emergency department complaints of dyspnea, generalized weakness, malaise, fatigue, wheezing and productive cough.  She had a fall at home landing on her knees due to her generalized weakness.  She uses oxygen as needed at home.  She denied fever, chills, rhinorrhea, sore throat or hemoptysis.  No chest pain, palpitations, diaphoresis, PND, orthopnea or pitting edema of the lower extremities.  No abdominal pain, nausea, emesis, diarrhea, constipation, melena or hematochezia.  No flank pain, dysuria, frequency or hematuria.  No polyuria, polydipsia, polyphagia or blurred vision.   Lab work: Coronavirus, influenza and RSV PCR was negative.  CBC showed white count 6.5, hemoglobin 11.6 g/dL and there were platelet clumps.  Unable to estimate.  Lactic acid was normal twice.  BNP 307 pg/mL.  BMP showed a 734 mmol/L, the rest of the electrolytes were normal.  Glucose 102, BUN 20 and creatinine 1.01 mg/dL with a GFR of 55 mL/min.  Imaging: Portable 1 view chest radiograph  showing right upper lobe pneumonia.  Follow-up imaging recommended in 3 to 4 weeks.  Right hip x-ray and bilateral knee x-rays were negative for fractures.   ED course: Initial vital signs were temperature 99.7 F, pulse 82, respiration 14, BP 157/67 mmHg O2 sat 97% on 5 L/min via nasal cannula.  The patient received acetaminophen 650 mg p.o. x 1, ceftriaxone 1 g IVPB and sodium chloride 1000 mm bolus.  Review of Systems: As mentioned in the history of present illness. All other systems reviewed and are negative. Past Medical History:  Diagnosis Date   Achilles tendon rupture, right, initial encounter    Allergic rhinitis, cause unspecified    Anemia, unspecified    Anxiety state, unspecified    Atherosclerosis of aorta (HCC)    TEE, June, 2010, grade 4 aortic arch atherosclerosis , Dr. Shirlee Latch   CAD (coronary artery disease)    DES and circumflex 2006 /  DES to LAD 2011   Carotid artery disease (HCC)    Doppler, November, 2011, stable, 0-39% bilateral, turbulent flow left subclavian   Chronic diastolic heart failure (HCC)    Cyst of thyroid    Diverticulosis of colon (without mention of hemorrhage)    DVT (deep venous thrombosis) (HCC)    in leg   Dysphasia    Possibly esophageal stricture   Esophageal reflux    Hiatal hernia    Hyperlipemia    Hypertension    Lumbago    Lumbar disc disease    MVP (mitral valve prolapse)    Neuropathy    Nonspecific abnormal finding in stool contents    Obesity, unspecified    Osteoarthrosis, unspecified whether generalized or localized, unspecified site    Osteopenia  Other diseases of lung, not elsewhere classified    PE (pulmonary thromboembolism) (HCC)    both lungs   Peripheral vascular disease, unspecified (HCC)    Personal history of colonic polyps    ADENOMATOUS POLYP   PFO (patent foramen ovale)    Patient had a very small PFO by TEE 2010.  This was seen only by bubble analysis during Valsalva   PONV (postoperative nausea and  vomiting)    Sleep apnea    uses CPAP nightly   Sleep related hypoventilation/hypoxemia in conditions classifiable elsewhere    Stroke [facility]) 11/2008   Treated with TPA? /     2010, TEE no left atrial clot, probably from atherosclerosis of the aortic arch   Subclavian artery disease (HCC)    Remote surgery by Dr. Betti Cruz.   Unspecified cerebral artery occlusion with cerebral infarction    Unspecified venous (peripheral) insufficiency    Vitamin B 12 deficiency    Past Surgical History:  Procedure Laterality Date   ACHILLES TENDON SURGERY Right 04/05/2018   Procedure: Right achilles tendon reconstruction;  Surgeon: Toni Arthurs, MD;  Location: [facility];  Service: Orthopedics;  Laterality: Right;    BACK SURGERY     cataracts     both eyes   CHOLECYSTECTOMY     CORONARY ANGIOPLASTY WITH STENT PLACEMENT  1990's   GASTROCNEMIUS RECESSION Right 04/05/2018   Procedure: Gastroc recession;  Surgeon: Toni Arthurs, MD;  Location: [facility];  Service: Orthopedics;  Laterality: Right;   LAPAROSCOPIC HYSTERECTOMY     LUMBAR DISC SURGERY     X 3   RIGHT/LEFT HEART CATH AND CORONARY ANGIOGRAPHY N/A 04/10/2017   Procedure: RIGHT/LEFT HEART CATH AND CORONARY ANGIOGRAPHY;  Surgeon: Lyn Records, MD;  Location: [facility];  Service: Cardiovascular;  Laterality: N/A;   TOTAL KNEE ARTHROPLASTY Right 01/14/2019   Procedure: TOTAL KNEE ARTHROPLASTY;  Surgeon: Ollen Gross, MD;  Location: [facility];  Service: Orthopedics;  Laterality: Right;    TOTAL KNEE ARTHROPLASTY Left 12/23/2019   Procedure: TOTAL KNEE ARTHROPLASTY;  Surgeon: Ollen Gross, MD;  Location: [facility];  Service: Orthopedics;  Laterality: Left;    urethral suspension  1993   Dr. Jennette Kettle   VARICOSE VEIN SURGERY     x 2   Social History:  reports that she quit smoking about 24 years ago. Her smoking use included cigarettes. She started smoking about 65 years ago. She has a 40 pack-year  smoking history. She has never used smokeless tobacco. She reports that she does not drink alcohol and does not use drugs.  Allergies  Allergen Reactions   Codeine Itching    Family History  Problem Relation Age of Onset   Heart disease Father    Heart attack Father    Alzheimer's disease Mother    Colon cancer Paternal Aunt    Breast cancer Other        cousions   Esophageal cancer Neg Hx    Rectal cancer Neg Hx    Stomach cancer Neg Hx     Prior to Admission medications   Medication Sig Start Date End Date Taking? Authorizing Provider  ALLERGY RELIEF CETIRIZINE 10 MG tablet Take 10 mg by mouth at bedtime. 02/14/22   [provider]  ARIPiprazole (ABILIFY) 2 MG tablet Take 1 tablet (2 mg total) by mouth every evening. 04/07/23     aspirin EC 81 MG tablet Take 1 tablet (81 mg total) by  mouth daily. Swallow whole. 03/13/23   Tonny Bollman, MD  buPROPion (WELLBUTRIN XL) 150 MG 24 hr tablet Take 300 mg by mouth in the morning. 10/31/15   [provider]  dicyclomine (BENTYL) 20 MG tablet Take 1 tablet (20 mg total) by mouth 3 (three) times daily as needed for muscle spasms. 10/28/22   Doree Albee, PA-C  furosemide (LASIX) 40 MG tablet Take 1 tablet (40 mg total) by mouth daily. 04/04/23   Tonny Bollman, MD  gabapentin (NEURONTIN) 300 MG capsule Take 600 mg by mouth 3 (three) times daily.  10/31/15   [provider]  hyoscyamine (LEVSIN) 0.125 MG tablet 1-2 tabs every 4-6 hours prn 09/19/22   Hilarie Fredrickson, MD  isosorbide mononitrate (IMDUR) 30 MG 24 hr tablet Take 0.5 tablets (15 mg total) by mouth at bedtime. 02/09/23     latanoprost (XALATAN) 0.005 % ophthalmic solution Place 1 drop into both eyes at bedtime. 08/04/21   [provider]  lidocaine (LIDODERM) 5 % Place 1 patch onto the skin daily. Remove & Discard patch within 12 hours or as directed by MD 01/22/23   Renne Crigler, PA-C  metoprolol tartrate (LOPRESSOR) 50 MG tablet Take 1 tablet (50  mg total) by mouth 2 (two) times daily. 05/08/23   Tonny Bollman, MD  nitroGLYCERIN (NITROSTAT) 0.4 MG SL tablet Place 0.4 mg under the tongue every 5 (five) minutes as needed for chest pain (TAKE UP TO 3 DOSES BEFORE CALLING 911).    [provider]  nystatin cream (MYCOSTATIN) Apply 1 Application topically 2 (two) times daily. Patient taking differently: Apply 1 Application topically 2 (two) times daily as needed (for itching under the breasts). 01/18/22   Doree Albee, PA-C  OXYGEN Inhale 2 L/min into the lungs as needed (for shortness of breath).    [provider]  pantoprazole (PROTONIX) 40 MG tablet Take 40 mg by mouth 2 (two) times daily before a meal.    [provider]  permethrin (ELIMITE) 5 % cream Apply to affected area once 04/07/23   Zenia Resides, MD  Polyethylene Glycol 3350 (PEG 3350) 17 GM/SCOOP POWD Mix 17 g (1 capful) in 8oz of liquid and drink by mouth daily for constipation. 11/11/22     polyethylene glycol powder (GLYCOLAX/MIRALAX) powder Take 17 g by mouth daily as needed for moderate constipation.     [name], MD  potassium chloride (KLOR-CON) 10 MEQ tablet Take 1 tablet (10 mEq total) by mouth in the morning. 03/08/23   Tonny Bollman, MD  PRESCRIPTION MEDICATION See admin instructions. CPAP- At bedtime    [provider]  rosuvastatin (CRESTOR) 10 MG tablet Take 1 tablet (10 mg total) by mouth at bedtime. 02/09/23     tiZANidine (ZANAFLEX) 4 MG tablet Take 4 mg by mouth daily as needed for muscle spasms. 08/09/21   [provider]  triamcinolone cream (KENALOG) 0.1 % Apply 1 Application topically 2 (two) times daily. 01/22/23   Renne Crigler, PA-C  TYLENOL 500 MG tablet Take 500-1,500 mg by mouth 2 (two) times daily as needed for mild pain or headache.    [provider]  Vibegron (GEMTESA) 75 MG TABS Take 1 tablet (75 mg total) by mouth daily. 12/12/22     metoprolol tartrate (LOPRESSOR) 25 MG tablet  Take 1 tablet (25 mg total) by mouth 2 (two) times daily. Patient taking differently: Take by mouth 2 (two) times daily. 04/04/23   Tonny Bollman, MD  Physical Exam: Vitals:   05/28/23 1232  BP: (!) 157/67  Pulse: 82  Resp: 14  Temp: 99.7 F (37.6 C)  TempSrc: Oral  SpO2: 97%   Physical Exam Vitals and nursing note reviewed.  Constitutional:      General: She is awake. She is not in acute distress.    Appearance: She is well-developed. She is obese. She is ill-appearing.     Interventions: Nasal cannula in place.  HENT:     Head: Normocephalic.     Nose: No rhinorrhea.     Mouth/Throat:     Mouth: Mucous membranes are moist.  Eyes:     General: No scleral icterus.    Pupils: Pupils are equal, round, and reactive to light.  Neck:     Vascular: No JVD.  Cardiovascular:     Rate and Rhythm: Normal rate and regular rhythm.     Heart sounds: S1 normal and S2 normal.  Pulmonary:     Breath sounds: Rhonchi and rales present. No decreased breath sounds or wheezing.  Abdominal:     General: Bowel sounds are normal. There is no distension.     Palpations: Abdomen is soft.     Tenderness: There is no abdominal tenderness.  Musculoskeletal:     Cervical back: Neck supple.     Right lower leg: No edema.     Left lower leg: No edema.  Skin:    General: Skin is warm and dry.  Neurological:     General: No focal deficit present.     Mental Status: She is alert and oriented to person, place, and time.  Psychiatric:        Mood and Affect: Mood normal.        Behavior: Behavior normal. Behavior is cooperative.     Data Reviewed:  Results are pending, will review when available. 07/13/2021 echocardiogram results IMPRESSIONS:   1. Left ventricular ejection fraction, by estimation, is 60 to 65%. The left ventricle has normal function. The left ventricle has no regional wall motion abnormalities. Left ventricular diastolic parameters are consistent with Grade I  diastolic dysfunction (impaired relaxation).  2. Right ventricular systolic function is normal. The right ventricular size is normal.  3. The mitral valve is normal in structure. Mild mitral valve regurgitation. No evidence of mitral stenosis.  4. The aortic valve is tricuspid. There is mild calcification of the aortic valve. Aortic valve regurgitation is not visualized. Aortic valve sclerosis is present, with no evidence of aortic valve stenosis.  5. The inferior vena cava is normal in size with greater than 50% respiratory variability, suggesting right atrial pressure of 3 mmHg.  EKG: Vent. rate 79 BPM PR interval 153 ms QRS duration 151 ms QT/QTcB 429/492 ms P-R-T axes 56 -59 17 Sinus rhythm RBBB and LAFB No significant change since last tracing  Assessment and Plan: Principal Problem:   Acute on chronic respiratory failure with hypoxia (HCC) In the setting of:   Right upper lobe pneumonia Admit to PCU/inpatient. Continue supplemental oxygen. Scheduled and as needed bronchodilators. Continue ceftriaxone 1 g IVPB daily. Continue azithromycin 500 mg IVPB daily. Check strep pneumoniae urinary antigen. Check sputum Gram stain, culture and sensitivity. Follow-up blood culture and sensitivity. Follow-up CBC and chemistry in the morning.  Active Problems:   OSA (obstructive sleep apnea) Continue CPAP at bedtime.    Essential hypertension Continue metoprolol 50 mg p.o. twice daily.    GERD Continue pantoprazole 40 mg p.o. daily.    Chronic constipation  Laxatives as needed.    CAD (coronary artery disease) Continue beta-blocker, aspirin and nitrates.    Lumbar scoliosis Analgesics as needed.    Chronic diastolic CHF (congestive heart failure) (HCC) Seems to be euvolemic. Continue metoprolol 50 mg p.o. twice daily.    Thrombocytopenia (HCC) Follow-up platelet count in AM.    Depression Continue Abilify 2 mg p.o. every evening.    Hyperlipidemia    Atherosclerosis of aorta (HCC) Continue rosuvastatin 10 mg p.o. daily.    Neuropathy Continue gabapentin 300 mg p.o. 3 times daily.    Advance Care Planning:   Code Status: Full Code   Consults:   Family Communication:   Severity of Illness: The appropriate patient status for this patient is INPATIENT. Inpatient status is judged to be reasonable and necessary in order to provide the required intensity of service to ensure the patient's safety. The patient's presenting symptoms, physical exam findings, and initial radiographic and laboratory data in the context of their chronic comorbidities is felt to place them at high risk for further clinical deterioration. Furthermore, it is not anticipated that the patient will be medically stable for discharge from the hospital within 2 midnights of admission.   * I certify that at the point of admission it is my clinical judgment that the patient will require inpatient hospital care spanning beyond 2 midnights from the point of admission due to high intensity of service, high risk for further deterioration and high frequency of surveillance required.*  Author: Bobette Mo, MD 05/28/2023 2:35 PM  For on call review [email or web address].   This document was prepared using Dragon voice recognition software and may contain some unintended transcription errors. No known lithotomy

## 2023-05-28 NOTE — ED Notes (Signed)
ED TO INPATIENT HANDOFF REPORT  ED Nurse Name and Phone #: Suann Larry Name/Age/Gender Cottie Banda 82 y.o. female Room/Bed: WA12/WA12  Code Status   Code Status: Prior  Home/SNF/Other Home Patient oriented to: self, place, time, and situation Is this baseline? Yes   Triage Complete: Triage complete  Chief Complaint Acute respiratory failure with hypoxia (HCC) [J96.01]  Triage Note Pt BIB EMS from Home due to weakness, shortness of breath, tachypnea, wheezing, malaise. Pt family called EMS after pt could not stand to get out of bed. EMS reports pt could not go 4 feet without getting fatigued. Pt SpO2 82% on RA. Pt uses 2 L O2 as needed at home.   Allergies Allergies  Allergen Reactions   Codeine Itching    Level of Care/Admitting Diagnosis ED Disposition     ED Disposition  Admit   Condition  --   Comment  Hospital Area: [facility] [[id number]]  Level of Care: Progressive [102]  Admit to Progressive based on following criteria: RESPIRATORY PROBLEMS hypoxemic/hypercapnic respiratory failure that is responsive to NIPPV (BiPAP) or High Flow Nasal Cannula (6-80 lpm). Frequent assessment/intervention, no > Q2 hrs < Q4 hrs, to maintain oxygenation and pulmonary hygiene.  May admit patient to [facility] or [facility] if equivalent level of care is available:: No  Covid Evaluation: Asymptomatic - no recent exposure (last 10 days) testing not required  Diagnosis: Acute respiratory failure with hypoxia [facility]) [[id number]]  Admitting Physician: Bobette Mo [[id number]]  Attending Physician: Bobette Mo [[id number]]  Certification:: I certify this patient will need inpatient services for at least 2 midnights  Expected Medical Readiness: 05/30/2023          B Medical/Surgery History Past Medical History:  Diagnosis Date   Achilles tendon rupture, right, initial encounter    Allergic rhinitis, cause unspecified    Anemia, unspecified    Anxiety  state, unspecified    Atherosclerosis of aorta (HCC)    TEE, June, 2010, grade 4 aortic arch atherosclerosis , Dr. Shirlee Latch   CAD (coronary artery disease)    DES and circumflex 2006 /  DES to LAD 2011   Carotid artery disease (HCC)    Doppler, November, 2011, stable, 0-39% bilateral, turbulent flow left subclavian   Chronic diastolic heart failure (HCC)    Cyst of thyroid    Diverticulosis of colon (without mention of hemorrhage)    DVT (deep venous thrombosis) (HCC)    in leg   Dysphasia    Possibly esophageal stricture   Esophageal reflux    Hiatal hernia    Hyperlipemia    Hypertension    Lumbago    Lumbar disc disease    MVP (mitral valve prolapse)    Neuropathy    Nonspecific abnormal finding in stool contents    Obesity, unspecified    Osteoarthrosis, unspecified whether generalized or localized, unspecified site    Osteopenia    Other diseases of lung, not elsewhere classified    PE (pulmonary thromboembolism) (HCC)    both lungs   Peripheral vascular disease, unspecified (HCC)    Personal history of colonic polyps    ADENOMATOUS POLYP   PFO (patent foramen ovale)    Patient had a very small PFO by TEE 2010.  This was seen only by bubble analysis during Valsalva   PONV (postoperative nausea and vomiting)    Sleep apnea    uses CPAP nightly   Sleep related hypoventilation/hypoxemia in conditions classifiable elsewhere  Stroke [facility]) 11/2008   Treated with TPA? /     2010, TEE no left atrial clot, probably from atherosclerosis of the aortic arch   Subclavian artery disease (HCC)    Remote surgery by Dr. Betti Cruz.   Unspecified cerebral artery occlusion with cerebral infarction    Unspecified venous (peripheral) insufficiency    Vitamin B 12 deficiency    Past Surgical History:  Procedure Laterality Date   ACHILLES TENDON SURGERY Right 04/05/2018   Procedure: Right achilles tendon reconstruction;  Surgeon: Toni Arthurs, MD;  Location: [facility];   Service: Orthopedics;  Laterality: Right;    BACK SURGERY     cataracts     both eyes   CHOLECYSTECTOMY     CORONARY ANGIOPLASTY WITH STENT PLACEMENT  1990's   GASTROCNEMIUS RECESSION Right 04/05/2018   Procedure: Gastroc recession;  Surgeon: Toni Arthurs, MD;  Location: [facility];  Service: Orthopedics;  Laterality: Right;   LAPAROSCOPIC HYSTERECTOMY     LUMBAR DISC SURGERY     X 3   RIGHT/LEFT HEART CATH AND CORONARY ANGIOGRAPHY N/A 04/10/2017   Procedure: RIGHT/LEFT HEART CATH AND CORONARY ANGIOGRAPHY;  Surgeon: Lyn Records, MD;  Location: [facility];  Service: Cardiovascular;  Laterality: N/A;   TOTAL KNEE ARTHROPLASTY Right 01/14/2019   Procedure: TOTAL KNEE ARTHROPLASTY;  Surgeon: Ollen Gross, MD;  Location: [facility];  Service: Orthopedics;  Laterality: Right;    TOTAL KNEE ARTHROPLASTY Left 12/23/2019   Procedure: TOTAL KNEE ARTHROPLASTY;  Surgeon: Ollen Gross, MD;  Location: [facility];  Service: Orthopedics;  Laterality: Left;    urethral suspension  1993   Dr. Jennette Kettle   VARICOSE VEIN SURGERY     x 2     A IV Location/Drains/Wounds Patient Lines/Drains/Airways Status     Active Line/Drains/Airways     Name Placement date Placement time Site Days   Peripheral IV 05/28/23 20 G Left Antecubital 05/28/23  1228  Antecubital  less than 1            Intake/Output Last 24 hours No intake or output data in the 24 hours ending 05/28/23 1445  Labs/Imaging Results for orders placed or performed during the hospital encounter of 05/28/23 (from the past 48 hours)  Resp panel by RT-PCR (RSV, Flu A&B, Covid) Anterior Nasal Swab     Status: None   Collection Time: 05/28/23 12:24 PM   Specimen: Anterior Nasal Swab  Result Value Ref Range   SARS Coronavirus 2 by RT PCR NEGATIVE NEGATIVE    Comment: (NOTE) SARS-CoV-2 target nucleic acids are NOT DETECTED.  The SARS-CoV-2 RNA is generally detectable in upper respiratory specimens during  the acute phase of infection. The lowest concentration of SARS-CoV-2 viral copies this assay can detect is 138 copies/mL. A negative result does not preclude SARS-Cov-2 infection and should not be used as the sole basis for treatment or other patient management decisions. A negative result may occur with  improper specimen collection/handling, submission of specimen other than nasopharyngeal swab, presence of viral mutation(s) within the areas targeted by this assay, and inadequate number of viral copies(<138 copies/mL). A negative result must be combined with clinical observations, patient history, and epidemiological information. The expected result is Negative.  Fact Sheet for Patients:  BloggerCourse.com  Fact Sheet for Healthcare Providers:  SeriousBroker.it  This test is no t yet approved or cleared by the [place] FDA and  has been authorized for detection and/or diagnosis of SARS-CoV-2  by FDA under an Emergency Use Authorization (EUA). This EUA will remain  in effect (meaning this test can be used) for the duration of the COVID-19 declaration under Section 564(b)(1) of the Act, 21 U.S.C.section 360bbb-3(b)(1), unless the authorization is terminated  or revoked sooner.       Influenza A by PCR NEGATIVE NEGATIVE   Influenza B by PCR NEGATIVE NEGATIVE    Comment: (NOTE) The Xpert Xpress SARS-CoV-2/FLU/RSV plus assay is intended as an aid in the diagnosis of influenza from Nasopharyngeal swab specimens and should not be used as a sole basis for treatment. Nasal washings and aspirates are unacceptable for Xpert Xpress SARS-CoV-2/FLU/RSV testing.  Fact Sheet for Patients: BloggerCourse.com  Fact Sheet for Healthcare Providers: SeriousBroker.it  This test is not yet approved or cleared by the [place] FDA and has been authorized for detection and/or diagnosis of  SARS-CoV-2 by FDA under an Emergency Use Authorization (EUA). This EUA will remain in effect (meaning this test can be used) for the duration of the COVID-19 declaration under Section 564(b)(1) of the Act, 21 U.S.C. section 360bbb-3(b)(1), unless the authorization is terminated or revoked.     Resp Syncytial Virus by PCR NEGATIVE NEGATIVE    Comment: (NOTE) Fact Sheet for Patients: BloggerCourse.com  Fact Sheet for Healthcare Providers: SeriousBroker.it  This test is not yet approved or cleared by the [place] FDA and has been authorized for detection and/or diagnosis of SARS-CoV-2 by FDA under an Emergency Use Authorization (EUA). This EUA will remain in effect (meaning this test can be used) for the duration of the COVID-19 declaration under Section 564(b)(1) of the Act, 21 U.S.C. section 360bbb-3(b)(1), unless the authorization is terminated or revoked.  Performed at [facility], [address] [place], [place] [address]   Basic metabolic panel     Status: Abnormal   Collection Time: 05/28/23  1:18 PM  Result Value Ref Range   Sodium 134 (L) 135 - 145 mmol/L   Potassium 4.0 3.5 - 5.1 mmol/L   Chloride 100 98 - 111 mmol/L   CO2 28 22 - 32 mmol/L   Glucose, Bld 102 (H) 70 - 99 mg/dL    Comment: Glucose reference range applies only to samples taken after fasting for at least 8 hours.   BUN 20 8 - 23 mg/dL   Creatinine, Ser 6.04 (H) 0.44 - 1.00 mg/dL   Calcium 9.3 8.9 - 54.0 mg/dL   GFR, Estimated 55 (L) >60 mL/min    Comment: (NOTE) Calculated using the CKD-EPI Creatinine Equation (2021)    Anion gap 6 5 - 15    Comment: Performed at [facility], [address], [place], [place] [address]  Brain natriuretic peptide     Status: Abnormal   Collection Time: 05/28/23  1:18 PM  Result Value Ref Range   B Natriuretic Peptide 306.9 (H) 0.0 - 100.0 pg/mL    Comment: Performed at  [facility], [address], [place], [place] [address]  CBC with Differential     Status: Abnormal   Collection Time: 05/28/23  1:18 PM  Result Value Ref Range   WBC 6.5 4.0 - 10.5 K/uL   RBC 4.41 3.87 - 5.11 MIL/uL   Hemoglobin 11.6 (L) 12.0 - 15.0 g/dL   HCT 95.6 21.3 - 08.6 %   MCV 85.9 80.0 - 100.0 fL   MCH 26.3 26.0 - 34.0 pg   MCHC 30.6 30.0 - 36.0 g/dL   RDW 57.8 (H)  11.5 - 15.5 %   Platelets PLATELET CLUMPS NOTED ON SMEAR, UNABLE TO ESTIMATE 150 - 400 K/uL    Comment: Immature Platelet Fraction may be clinically indicated, consider ordering this additional test [id number]    nRBC 0.0 0.0 - 0.2 %   Neutrophils Relative % 81 %   Neutro Abs 5.3 1.7 - 7.7 K/uL   Lymphocytes Relative 9 %   Lymphs Abs 0.6 (L) 0.7 - 4.0 K/uL   Monocytes Relative 5 %   Monocytes Absolute 0.3 0.1 - 1.0 K/uL   Eosinophils Relative 2 %   Eosinophils Absolute 0.1 0.0 - 0.5 K/uL   Basophils Relative 1 %   Basophils Absolute 0.1 0.0 - 0.1 K/uL   Immature Granulocytes 2 %   Abs Immature Granulocytes 0.13 (H) 0.00 - 0.07 K/uL    Comment: Performed at [facility], [address] [place], [place] [address]  I-Stat Lactic Acid     Status: None   Collection Time: 05/28/23  1:35 PM  Result Value Ref Range   Lactic Acid, Venous 1.5 0.5 - 1.9 mmol/L   DG Chest Port 1 View Result Date: 05/28/2023 CLINICAL DATA:  Shortness of breath EXAM: PORTABLE CHEST 1 VIEW COMPARISON:  01/22/2023 FINDINGS: Cardiomegaly. Aortic atherosclerosis. Right upper lobe airspace consolidation. Left lung is clear. No pleural effusion or pneumothorax. IMPRESSION: Right upper lobe pneumonia. Followup PA and lateral chest X-ray is recommended in 3-4 weeks following trial of antibiotic therapy to ensure resolution. Electronically Signed   By: Duanne Guess D.O.   On: 05/28/2023 12:58    Pending Labs Unresulted Labs (From admission, onward)     Start     Ordered   05/28/23 1224  Urinalysis,  Routine w reflex microscopic -Urine, Clean Catch  Once,   URGENT       Question:  Specimen Source  Answer:  Urine, Clean Catch   05/28/23 1223   05/28/23 1224  Culture, blood (routine x 2)  BLOOD CULTURE X 2,   R (with STAT occurrences)      05/28/23 1223            Vitals/Pain Today's Vitals   05/28/23 1229 05/28/23 1232  BP:  (!) 157/67  Pulse:  82  Resp:  14  Temp:  99.7 F (37.6 C)  TempSrc:  Oral  SpO2:  97%  PainSc: 0-No pain     Isolation Precautions No active isolations  Medications Medications  cefTRIAXone (ROCEPHIN) 1 g in sodium chloride 0.9 % 100 mL IVPB (has no administration in time range)  azithromycin (ZITHROMAX) 500 mg in sodium chloride 0.9 % 250 mL IVPB (has no administration in time range)  acetaminophen (TYLENOL) tablet 650 mg (has no administration in time range)  sodium chloride 0.9 % bolus 1,000 mL (has no administration in time range)    Mobility walks with person assist     Focused Assessments Community acquired pneumonia, Acute respiratory failure with hypoxia   R Recommendations: See Admitting Provider Note  Report given to:   Additional Notes: .

## 2023-05-28 NOTE — ED Triage Notes (Signed)
Pt BIB EMS from Home due to weakness, shortness of breath, tachypnea, wheezing, malaise. Pt family called EMS after pt could not stand to get out of bed. EMS reports pt could not go 4 feet without getting fatigued. Pt SpO2 82% on RA. Pt uses 2 L O2 as needed at home.

## 2023-05-28 NOTE — Progress Notes (Signed)
   05/28/23 2035  BiPAP/CPAP/SIPAP  Reason BIPAP/CPAP not in use Non-compliant (Pt states she only use O2 PRN at home no cpap. Pt refused cpap. advised to let RN know if she changes her mind. Pt understood.)  BiPAP/CPAP /SiPAP Vitals  Resp (!) 34  SpO2 96 %  Bilateral Breath Sounds Diminished  MEWS Score/Color  MEWS Score 2  MEWS Score Color Yellow

## 2023-05-28 NOTE — ED Provider Notes (Signed)
[place] EMERGENCY DEPARTMENT AT [facility] Provider Note   CSN: [id number] Arrival date & time: 05/28/23  1207     History {Add pertinent medical, surgical, social history, OB history to HPI:1} Chief Complaint  Patient presents with   Shortness of Breath   Hypoxemia    Stephanie Alvarado is a 82 y.o. female.  Pt is a 82 yo female with pmhx significant for htn, cad, hld, gerd, cad, sleep apnea, and hx PE.  Pt has been sob for the past day.  She does wear 2L oxygen prn, pt was very fatigued today.  Pt was so fatigued, she could not get out of bed and slid to the ground.  She did land on her knees and has bilateral knee pain and right hip pain.   Pt was 82% on RA and so was put on oxygen at 4L.  She denies f/c.       Home Medications Prior to Admission medications   Medication Sig Start Date End Date Taking? Authorizing Provider  ALLERGY RELIEF CETIRIZINE 10 MG tablet Take 10 mg by mouth at bedtime. 02/14/22   [provider]  ARIPiprazole (ABILIFY) 2 MG tablet Take 1 tablet (2 mg total) by mouth every evening. 04/07/23     aspirin EC 81 MG tablet Take 1 tablet (81 mg total) by mouth daily. Swallow whole. 03/13/23   Tonny Bollman, MD  buPROPion (WELLBUTRIN XL) 150 MG 24 hr tablet Take 300 mg by mouth in the morning. 10/31/15   [provider]  dicyclomine (BENTYL) 20 MG tablet Take 1 tablet (20 mg total) by mouth 3 (three) times daily as needed for muscle spasms. 10/28/22   Doree Albee, PA-C  furosemide (LASIX) 40 MG tablet Take 1 tablet (40 mg total) by mouth daily. 04/04/23   Tonny Bollman, MD  gabapentin (NEURONTIN) 300 MG capsule Take 600 mg by mouth 3 (three) times daily.  10/31/15   [provider]  hyoscyamine (LEVSIN) 0.125 MG tablet 1-2 tabs every 4-6 hours prn 09/19/22   Hilarie Fredrickson, MD  isosorbide mononitrate (IMDUR) 30 MG 24 hr tablet Take 0.5 tablets (15 mg total) by mouth at bedtime. 02/09/23     latanoprost (XALATAN) 0.005 %  ophthalmic solution Place 1 drop into both eyes at bedtime. 08/04/21   [provider]  lidocaine (LIDODERM) 5 % Place 1 patch onto the skin daily. Remove & Discard patch within 12 hours or as directed by MD 01/22/23   Renne Crigler, PA-C  metoprolol tartrate (LOPRESSOR) 50 MG tablet Take 1 tablet (50 mg total) by mouth 2 (two) times daily. 05/08/23   Tonny Bollman, MD  nitroGLYCERIN (NITROSTAT) 0.4 MG SL tablet Place 0.4 mg under the tongue every 5 (five) minutes as needed for chest pain (TAKE UP TO 3 DOSES BEFORE CALLING 911).    [provider]  nystatin cream (MYCOSTATIN) Apply 1 Application topically 2 (two) times daily. Patient taking differently: Apply 1 Application topically 2 (two) times daily as needed (for itching under the breasts). 01/18/22   Doree Albee, PA-C  OXYGEN Inhale 2 L/min into the lungs as needed (for shortness of breath).    [provider]  pantoprazole (PROTONIX) 40 MG tablet Take 40 mg by mouth 2 (two) times daily before a meal.    [provider]  permethrin (ELIMITE) 5 % cream Apply to affected area once 04/07/23   Zenia Resides, MD  Polyethylene Glycol 3350 (PEG 3350) 17 GM/SCOOP POWD  Mix 17 g (1 capful) in 8oz of liquid and drink by mouth daily for constipation. 11/11/22     polyethylene glycol powder (GLYCOLAX/MIRALAX) powder Take 17 g by mouth daily as needed for moderate constipation.     [name], MD  potassium chloride (KLOR-CON) 10 MEQ tablet Take 1 tablet (10 mEq total) by mouth in the morning. 03/08/23   Tonny Bollman, MD  PRESCRIPTION MEDICATION See admin instructions. CPAP- At bedtime    [provider]  rosuvastatin (CRESTOR) 10 MG tablet Take 1 tablet (10 mg total) by mouth at bedtime. 02/09/23     tiZANidine (ZANAFLEX) 4 MG tablet Take 4 mg by mouth daily as needed for muscle spasms. 08/09/21   [provider]  triamcinolone cream (KENALOG) 0.1 % Apply 1 Application topically 2 (two)  times daily. 01/22/23   Renne Crigler, PA-C  TYLENOL 500 MG tablet Take 500-1,500 mg by mouth 2 (two) times daily as needed for mild pain or headache.    [provider]  Vibegron (GEMTESA) 75 MG TABS Take 1 tablet (75 mg total) by mouth daily. 12/12/22     metoprolol tartrate (LOPRESSOR) 25 MG tablet Take 1 tablet (25 mg total) by mouth 2 (two) times daily. Patient taking differently: Take by mouth 2 (two) times daily. 04/04/23   Tonny Bollman, MD      Allergies    Codeine    Review of Systems   Review of Systems  Respiratory:  Positive for cough and shortness of breath.   Musculoskeletal:        Right hip, B knee pain  All other systems reviewed and are negative.   Physical Exam Updated Vital Signs BP (!) 157/67 (BP Location: Right Arm)   Pulse 82   Temp 99.7 F (37.6 C) (Oral)   Resp 14   SpO2 97%  Physical Exam Vitals and nursing note reviewed.  Constitutional:      Appearance: She is well-developed. She is ill-appearing.  HENT:     Head: Normocephalic and atraumatic.     Mouth/Throat:     Mouth: Mucous membranes are moist.     Pharynx: Oropharynx is clear.  Eyes:     Extraocular Movements: Extraocular movements intact.     Pupils: Pupils are equal, round, and reactive to light.  Cardiovascular:     Rate and Rhythm: Normal rate and regular rhythm.  Pulmonary:     Effort: Tachypnea present.     Breath sounds: Rhonchi present.  Abdominal:     General: Bowel sounds are normal.     Palpations: Abdomen is soft.  Musculoskeletal:        General: Normal range of motion.     Cervical back: Normal range of motion and neck supple.     Comments: Small abrasions bilateral knee.  Mild tenderness to both knees and to right hip.  No shortening.  Skin:    General: Skin is warm.     Capillary Refill: Capillary refill takes less than 2 seconds.  Neurological:     General: No focal deficit present.     Mental Status: She is alert and oriented to person, place, and  time.  Psychiatric:        Mood and Affect: Mood normal.        Behavior: Behavior normal.     ED Results / Procedures / Treatments   Labs (all labs ordered are listed, but only abnormal results are displayed) Labs Reviewed  BASIC METABOLIC PANEL - Abnormal;  Notable for the following components:      Result Value   Sodium 134 (*)    Glucose, Bld 102 (*)    Creatinine, Ser 1.01 (*)    GFR, Estimated 55 (*)    All other components within normal limits  BRAIN NATRIURETIC PEPTIDE - Abnormal; Notable for the following components:   B Natriuretic Peptide 306.9 (*)    All other components within normal limits  CBC WITH DIFFERENTIAL/PLATELET - Abnormal; Notable for the following components:   Hemoglobin 11.6 (*)    RDW 20.3 (*)    Lymphs Abs 0.6 (*)    Abs Immature Granulocytes 0.13 (*)    All other components within normal limits  RESP PANEL BY RT-PCR (RSV, FLU A&B, COVID)  RVPGX2  CULTURE, BLOOD (ROUTINE X 2)  CULTURE, BLOOD (ROUTINE X 2)  URINALYSIS, ROUTINE W REFLEX MICROSCOPIC  I-STAT CG4 LACTIC ACID, ED  I-STAT CG4 LACTIC ACID, ED    EKG EKG Interpretation Date/Time:  Sunday May 28 2023 12:34:59 EST Ventricular Rate:  79 PR Interval:  153 QRS Duration:  151 QT Interval:  429 QTC Calculation: 492 R Axis:   -59  Text Interpretation: Sinus rhythm RBBB and LAFB No significant change since last tracing Confirmed by Jacalyn Lefevre [phone]) on 05/28/2023 2:22:03 PM  Radiology DG Chest Port 1 View Result Date: 05/28/2023 CLINICAL DATA:  Shortness of breath EXAM: PORTABLE CHEST 1 VIEW COMPARISON:  01/22/2023 FINDINGS: Cardiomegaly. Aortic atherosclerosis. Right upper lobe airspace consolidation. Left lung is clear. No pleural effusion or pneumothorax. IMPRESSION: Right upper lobe pneumonia. Followup PA and lateral chest X-ray is recommended in 3-4 weeks following trial of antibiotic therapy to ensure resolution. Electronically Signed   By: Duanne Guess D.O.   On:  05/28/2023 12:58    Procedures Procedures  {Document cardiac monitor, telemetry assessment procedure when appropriate:1}  Medications Ordered in ED Medications  cefTRIAXone (ROCEPHIN) 1 g in sodium chloride 0.9 % 100 mL IVPB (has no administration in time range)  azithromycin (ZITHROMAX) 500 mg in sodium chloride 0.9 % 250 mL IVPB (has no administration in time range)  acetaminophen (TYLENOL) tablet 650 mg (has no administration in time range)  sodium chloride 0.9 % bolus 1,000 mL (has no administration in time range)    ED Course/ Medical Decision Making/ A&P   {   Click here for ABCD2, HEART and other calculatorsREFRESH Note before signing :1}                              Medical Decision Making Amount and/or Complexity of Data Reviewed Labs: ordered. Radiology: ordered.  Risk OTC drugs. Decision regarding hospitalization.   This patient presents to the ED for concern of sob, this involves an extensive number of treatment options, and is a complaint that carries with it a high risk of complications and morbidity.  The differential diagnosis includes pna, bronchitis, covid/flu/rsv   Co morbidities that complicate the patient evaluation  htn, cad, hld, gerd, cad, sleep apnea, and hx PE   Additional history obtained:  Additional history obtained from epic chart review External records from outside source obtained and reviewed including EMS report/sig other   Lab Tests:  I Ordered, and personally interpreted labs.  The pertinent results include:  covid/flu/rsv neg, cbc with wbc nl, bmp nl, lactic nl, bnp 306.9   Imaging Studies ordered:  I ordered imaging studies including cxr, knee, hip  I independently visualized and interpreted imaging  which showed  CXR: Right upper lobe pneumonia. Followup PA and lateral chest X-ray is  recommended in 3-4 weeks following trial of antibiotic therapy to  ensure resolution.   I agree with the radiologist  interpretation   Cardiac Monitoring:  The patient was maintained on a cardiac monitor.  I personally viewed and interpreted the cardiac monitored which showed an underlying rhythm of: nsr   Medicines ordered and prescription drug management:  I ordered medication including ivfs/abx  for sx  Reevaluation of the patient after these medicines showed that the patient improved I have reviewed the patients home medicines and have made adjustments as needed   Test Considered:  ct   Critical Interventions:  Abx, oxygen   Consultations Obtained:  I requested consultation with the hospitalist (Dr. Robb Matar),  and discussed lab and imaging findings as well as pertinent plan - he will admit   Problem List / ED Course:  Cap with hypoxia:  pt on 4L oxygen.  She is on 2L prn normally.  She is given rocephin/zithromax for pna. Fall   Reevaluation:  After the interventions noted above, I reevaluated the patient and found that they have :improved   Social Determinants of Health:  Lives at home   Dispostion:  After consideration of the diagnostic results and the patients response to treatment, I feel that the patent would benefit from admission.    {Document critical care time when appropriate:1} {Document review of labs and clinical decision tools ie heart score, Chads2Vasc2 etc:1}  {Document your independent review of radiology images, and any outside records:1} {Document your discussion with family members, caretakers, and with consultants:1} {Document social determinants of health affecting pt's care:1} {Document your decision making why or why not admission, treatments were needed:1} Final Clinical Impression(s) / ED Diagnoses Final diagnoses:  Community acquired pneumonia of right upper lobe of lung  Acute respiratory failure with hypoxia (HCC)    Rx / DC Orders ED Discharge Orders     None

## 2023-05-29 DIAGNOSIS — I5032 Chronic diastolic (congestive) heart failure: Secondary | ICD-10-CM

## 2023-05-29 DIAGNOSIS — J9601 Acute respiratory failure with hypoxia: Secondary | ICD-10-CM | POA: Diagnosis not present

## 2023-05-29 DIAGNOSIS — J189 Pneumonia, unspecified organism: Secondary | ICD-10-CM

## 2023-05-29 DIAGNOSIS — I2510 Atherosclerotic heart disease of native coronary artery without angina pectoris: Secondary | ICD-10-CM

## 2023-05-29 DIAGNOSIS — K5909 Other constipation: Secondary | ICD-10-CM

## 2023-05-29 DIAGNOSIS — J9621 Acute and chronic respiratory failure with hypoxia: Secondary | ICD-10-CM | POA: Diagnosis not present

## 2023-05-29 LAB — COMPREHENSIVE METABOLIC PANEL
ALT: 9 U/L (ref 0–44)
AST: 14 U/L — ABNORMAL LOW (ref 15–41)
Albumin: 3 g/dL — ABNORMAL LOW (ref 3.5–5.0)
Alkaline Phosphatase: 64 U/L (ref 38–126)
Anion gap: 3 — ABNORMAL LOW (ref 5–15)
BUN: 19 mg/dL (ref 8–23)
CO2: 27 mmol/L (ref 22–32)
Calcium: 8.7 mg/dL — ABNORMAL LOW (ref 8.9–10.3)
Chloride: 103 mmol/L (ref 98–111)
Creatinine, Ser: 0.9 mg/dL (ref 0.44–1.00)
GFR, Estimated: >60 mL/min (ref >60)
Glucose, Bld: 114 mg/dL — ABNORMAL HIGH (ref 70–99)
Potassium: 3.9 mmol/L (ref 3.5–5.1)
Sodium: 133 mmol/L — ABNORMAL LOW (ref 135–145)
Total Bilirubin: 0.8 mg/dL (ref <1.2)
Total Protein: 5.7 g/dL — ABNORMAL LOW (ref 6.5–8.1)

## 2023-05-29 LAB — BLOOD CULTURE ID PANEL (REFLEXED) - BCID2

## 2023-05-29 LAB — URINALYSIS, ROUTINE W REFLEX MICROSCOPIC
Bacteria, UA: NONE SEEN
Bilirubin Urine: NEGATIVE
Glucose, UA: NEGATIVE mg/dL
Hgb urine dipstick: NEGATIVE
Ketones, ur: NEGATIVE mg/dL
Leukocytes,Ua: NEGATIVE
Nitrite: NEGATIVE
Protein, ur: 30 mg/dL — AB
Specific Gravity, Urine: 1.02 (ref 1.005–1.030)
pH: 5 (ref 5.0–8.0)

## 2023-05-29 LAB — CBC
HCT: 32.1 % — ABNORMAL LOW (ref 36.0–46.0)
Hemoglobin: 9.6 g/dL — ABNORMAL LOW (ref 12.0–15.0)
MCH: 26.2 pg (ref 26.0–34.0)
MCHC: 29.9 g/dL — ABNORMAL LOW (ref 30.0–36.0)
MCV: 87.7 fL (ref 80.0–100.0)
Platelets: UNDETERMINED 10*3/uL (ref 150–400)
RBC: 3.66 MIL/uL — ABNORMAL LOW (ref 3.87–5.11)
RDW: 20.5 % — ABNORMAL HIGH (ref 11.5–15.5)
WBC: 6.4 10*3/uL (ref 4.0–10.5)
nRBC: 0 % (ref 0.0–0.2)

## 2023-05-29 LAB — STREP PNEUMONIAE URINARY ANTIGEN: Strep Pneumo Urinary Antigen: NEGATIVE

## 2023-05-29 LAB — MRSA NEXT GEN BY PCR, NASAL: MRSA by PCR Next Gen: NOT DETECTED

## 2023-05-29 MED ORDER — VANCOMYCIN HCL 1250 MG/250ML IV SOLN
1250.0000 mg | INTRAVENOUS | Status: DC
  Administered 2023-05-30: 1250 mg via INTRAVENOUS
  Filled 2023-05-29: qty 250

## 2023-05-29 MED ORDER — VANCOMYCIN HCL 1.5 G IV SOLR
1500.0000 mg | Freq: Once | INTRAVENOUS | Status: AC
  Administered 2023-05-29: 1500 mg via INTRAVENOUS
  Filled 2023-05-29: qty 30

## 2023-05-29 MED ORDER — IPRATROPIUM-ALBUTEROL 0.5-2.5 (3) MG/3ML IN SOLN
3.0000 mL | Freq: Two times a day (BID) | RESPIRATORY_TRACT | Status: DC
  Administered 2023-05-29 – 2023-06-01 (×6): 3 mL via RESPIRATORY_TRACT
  Filled 2023-05-29 (×7): qty 3

## 2023-05-29 NOTE — Progress Notes (Signed)
PHARMACY - PHYSICIAN COMMUNICATION CRITICAL VALUE ALERT - BLOOD CULTURE IDENTIFICATION (BCID)  Stephanie Alvarado is an 82 y.o. female who presented to [facility] on 05/28/2023 with a chief complaint of  Chief Complaint  Patient presents with   Shortness of Breath   Hypoxemia     Assessment: Admitted with CAP   Name of physician (or Provider) Contacted: Dr. Isidoro Donning  Current antibiotics: ceftriaxone and azithromycin  Changes to prescribed antibiotics recommended:  - MD states to start vancomycin  - Vancomycin 1500 mg IV x1 then vancomycin 150 mg IV q24h ( AUC 477, Scr 0.90 mg/dl, Wt 28.4 kg)   Results for orders placed or performed during the hospital encounter of 05/28/23  Blood Culture ID Panel (Reflexed) (Collected: 05/28/2023  1:18 PM)  Result Value Ref Range   Enterococcus faecalis NOT DETECTED NOT DETECTED   Enterococcus Faecium NOT DETECTED NOT DETECTED   Listeria monocytogenes NOT DETECTED NOT DETECTED   Staphylococcus species DETECTED (A) NOT DETECTED   Staphylococcus aureus (BCID) NOT DETECTED NOT DETECTED   Staphylococcus epidermidis NOT DETECTED NOT DETECTED   Staphylococcus lugdunensis NOT DETECTED NOT DETECTED   Streptococcus species NOT DETECTED NOT DETECTED   Streptococcus agalactiae NOT DETECTED NOT DETECTED   Streptococcus pneumoniae NOT DETECTED NOT DETECTED   Streptococcus pyogenes NOT DETECTED NOT DETECTED   A.calcoaceticus-baumannii NOT DETECTED NOT DETECTED   Bacteroides fragilis NOT DETECTED NOT DETECTED   Enterobacterales NOT DETECTED NOT DETECTED   Enterobacter cloacae complex NOT DETECTED NOT DETECTED   Escherichia coli NOT DETECTED NOT DETECTED   Klebsiella aerogenes NOT DETECTED NOT DETECTED   Klebsiella oxytoca NOT DETECTED NOT DETECTED   Klebsiella pneumoniae NOT DETECTED NOT DETECTED   Proteus species NOT DETECTED NOT DETECTED   Salmonella species NOT DETECTED NOT DETECTED   Serratia marcescens NOT DETECTED NOT DETECTED   Haemophilus influenzae NOT  DETECTED NOT DETECTED   Neisseria meningitidis NOT DETECTED NOT DETECTED   Pseudomonas aeruginosa NOT DETECTED NOT DETECTED   Stenotrophomonas maltophilia NOT DETECTED NOT DETECTED   Candida albicans NOT DETECTED NOT DETECTED   Candida auris NOT DETECTED NOT DETECTED   Candida glabrata NOT DETECTED NOT DETECTED   Candida krusei NOT DETECTED NOT DETECTED   Candida parapsilosis NOT DETECTED NOT DETECTED   Candida tropicalis NOT DETECTED NOT DETECTED   Cryptococcus neoformans/gattii NOT DETECTED NOT DETECTED    [name] 05/29/2023  12:36 PM

## 2023-05-29 NOTE — Progress Notes (Signed)
[facility]                                                                              Stephanie Alvarado, is a 82 y.o. female, DOB - [birth year], WUJ:[id number] Admit date - 05/28/2023    Outpatient Primary MD for the patient is [name], MD  LOS - 1  days  Chief Complaint  Patient presents with   Shortness of Breath   Hypoxemia       Brief summary   Patient is a 82 year old female with anemia, anxiety, CAD, carotid artery disease, chronic diastolic CHF, thyroid cyst, diverticulosis, DVT, PE, dysphagia, esophageal stricture, GERD, HTN, HLP, lumbar disc disease, mitral valve prolapse, peripheral neuropathy PFO, PVD, OSA on CPAP, history of stroke treated with tPA, vitamin B12 deficiency presented to ED with dyspnea, generalized weakness, malaise, fatigue, wheezing and productive cough.  She had a fall at home landing on her knees due to generalized weakness, uses O2 as needed.  No fever chills, chest pain, palpitations orthopnea or PND. Chest x-ray showed right upper lobe pneumonia. Right hip x-ray, bilateral knee x-ray negative for fractures COVID, flu, RSV PCR negative   Assessment & Plan    Principal Problem:   Acute on chronic respiratory failure with hypoxia (HCC), right upper lobe pneumonia -Placed on IV ceftriaxone, Zithromax -BCID + staph species, for now placed on IV vancomycin until results -Urine strep antigen negative, urine Legionella antigen pending -Follow blood cultures, continue bronchodilators    Active Problems:   OSA (obstructive sleep apnea) - Continue CPAP at bedtime.     Essential hypertension -Continue metoprolol      GERD -Continue Protonix       CAD (coronary artery disease) -Currently no anginal symptoms, continue aspirin, beta-blocker, imdur.     Lumbar stenosis -Continue pain control as needed     Chronic diastolic CHF (congestive heart failure) (HCC) -Seems to be euvolemic. -Continue metoprolol 50 mg p.o. twice  daily. -BNP slightly elevated 306.9, will verify if patient is taking Lasix at home -2D echo 07/13/2021 had shown EF of 60 to 65%, G1 DD     Thrombocytopenia (HCC) Follow-up platelet count in AM.     Depression Continue Abilify 2 mg p.o. every evening.     Hyperlipidemia   Atherosclerosis of aorta (HCC) -Continue statin      Neuropathy -Continue gabapentin   Estimated body mass index is 28.09 kg/m as calculated from the following:   Height as of this encounter: 5\' 8"  (1.727 m).   Weight as of this encounter: 83.8 kg.  Code Status: Full code DVT Prophylaxis:  SCDs Start: 05/28/23 1537   Level of Care: Level of care: Progressive Family Communication: Updated patient Disposition Plan:      Remains inpatient appropriate:      Procedures:  None  Consultants:   None  Antimicrobials:   Anti-infectives (From admission, onward)    Start     Dose/Rate Route Frequency Ordered Stop   05/30/23 1200  vancomycin (VANCOREADY) IVPB 1250 mg/250 mL        1,250 mg 166.7 mL/hr over 90 Minutes Intravenous Every 24 hours 05/29/23 1327  05/29/23 1500  cefTRIAXone (ROCEPHIN) 1 g in sodium chloride 0.9 % 100 mL IVPB        1 g 200 mL/hr over 30 Minutes Intravenous Every 24 hours 05/28/23 1537 06/02/23 1459   05/29/23 1500  azithromycin (ZITHROMAX) 500 mg in sodium chloride 0.9 % 250 mL IVPB        500 mg 250 mL/hr over 60 Minutes Intravenous Every 24 hours 05/28/23 1537 06/03/23 1459   05/29/23 1315  Vancomycin (VANCOCIN) 1,500 mg in sodium chloride 0.9 % 500 mL IVPB        1,500 mg 250 mL/hr over 120 Minutes Intravenous  Once 05/29/23 1223 05/29/23 1455   05/28/23 1315  cefTRIAXone (ROCEPHIN) 1 g in sodium chloride 0.9 % 100 mL IVPB        1 g 200 mL/hr over 30 Minutes Intravenous  Once 05/28/23 1306 05/28/23 1539   05/28/23 1315  azithromycin (ZITHROMAX) 500 mg in sodium chloride 0.9 % 250 mL IVPB  Status:  Discontinued        500 mg 250 mL/hr over 60 Minutes Intravenous  Once  05/28/23 1306 05/29/23 0900          Medications  ARIPiprazole  2 mg Oral QPM   aspirin EC  81 mg Oral Daily   gabapentin  600 mg Oral TID   ipratropium-albuterol  3 mL Nebulization TID   isosorbide mononitrate  15 mg Oral QHS   latanoprost  1 drop Both Eyes QHS   metoprolol tartrate  50 mg Oral BID   pantoprazole  40 mg Oral BID AC   rosuvastatin  10 mg Oral QHS      Subjective:   Stephanie Alvarado was seen and examined today.  No acute complaints, no chest pain, fevers or chills.  No nausea vomiting abdominal pain.  Feels slightly better today.   No acute events overnight.    Objective:   Vitals:   05/29/23 0445 05/29/23 0843 05/29/23 0857 05/29/23 1412  BP: (!) 131/56   (!) 120/48  Pulse: 68   69  Resp: 16     Temp: 98.2 F (36.8 C)   98.4 F (36.9 C)  TempSrc: Oral   Oral  SpO2: 97% 96%  99%  Weight:   83.8 kg   Height:   5\' 8"  (1.727 m)     Intake/Output Summary (Last 24 hours) at 05/29/2023 1522 Last data filed at 05/29/2023 1255 Gross per 24 hour  Intake 360 ml  Output 800 ml  Net -440 ml     Wt Readings from Last 3 Encounters:  05/29/23 83.8 kg  03/13/23 87 kg  09/08/22 85.8 kg     Exam General: Alert and oriented x 3, NAD Cardiovascular: S1 S2 auscultated,  RRR Respiratory: Rhonchi right lung, no wheezing Gastrointestinal: Soft, nontender, nondistended, + bowel sounds Ext: no pedal edema bilaterally Neuro: no new deficits Psych: Normal affect     Data Reviewed:  I have personally reviewed following labs    CBC Lab Results  Component Value Date   WBC 6.4 05/29/2023   RBC 3.66 (L) 05/29/2023   HGB 9.6 (L) 05/29/2023   HCT 32.1 (L) 05/29/2023   MCV 87.7 05/29/2023   MCH 26.2 05/29/2023   PLT PLATELET CLUMPS NOTED ON SMEAR, UNABLE TO ESTIMATE 05/29/2023   MCHC 29.9 (L) 05/29/2023   RDW 20.5 (H) 05/29/2023   LYMPHSABS 0.6 (L) 05/28/2023   MONOABS 0.3 05/28/2023   EOSABS 0.1 05/28/2023   BASOSABS 0.1 05/28/2023  Last metabolic  panel Lab Results  Component Value Date   NA 133 (L) 05/29/2023   K 3.9 05/29/2023   CL 103 05/29/2023   CO2 27 05/29/2023   BUN 19 05/29/2023   CREATININE 0.90 05/29/2023   GLUCOSE 114 (H) 05/29/2023   GFRNONAA >60 05/29/2023   GFRAA >60 12/25/2019   CALCIUM 8.7 (L) 05/29/2023   PHOS 3.0 07/11/2021   PROT 5.7 (L) 05/29/2023   ALBUMIN 3.0 (L) 05/29/2023   BILITOT 0.8 05/29/2023   ALKPHOS 64 05/29/2023   AST 14 (L) 05/29/2023   ALT 9 05/29/2023   ANIONGAP 3 (L) 05/29/2023    CBG (last 3)  No results for input(s): "GLUCAP" in the last 72 hours.    Coagulation Profile: No results for input(s): "INR", "PROTIME" in the last 168 hours.   Radiology Studies: I have personally reviewed the imaging studies  DG Knee Complete 4 Views Left Result Date: 05/28/2023 CLINICAL DATA:  Pain EXAM: LEFT KNEE - COMPLETE 4+ VIEW COMPARISON:  None Available. FINDINGS: Status post left total knee arthroplasty. Arthroplasty components are in their expected alignment without periprosthetic lucency or fracture. No significant knee joint effusion. Atherosclerotic vascular calcifications. Soft tissues within normal limits. IMPRESSION: Status post left total knee arthroplasty without evidence of hardware complication. Electronically Signed   By: Duanne Guess D.O.   On: 05/28/2023 15:05   DG Knee Complete 4 Views Right Result Date: 05/28/2023 CLINICAL DATA:  Pain EXAM: RIGHT KNEE - COMPLETE 4+ VIEW COMPARISON:  None Available. FINDINGS: Status post right total knee arthroplasty. Arthroplasty components are in their expected alignment without periprosthetic lucency or fracture. No significant knee joint effusion. Atherosclerotic vascular calcifications. Soft tissues within normal limits. IMPRESSION: Status post right total knee arthroplasty without evidence of hardware complication. Electronically Signed   By: Duanne Guess D.O.   On: 05/28/2023 15:05   DG Hip Unilat W or Wo Pelvis 2-3 Views  Right Result Date: 05/28/2023 CLINICAL DATA:  Right hip pain EXAM: DG HIP (WITH OR WITHOUT PELVIS) 2-3V RIGHT COMPARISON:  04/19/2016 FINDINGS: There is no evidence of hip fracture or dislocation. Mild degenerative changes of the right hip. Prior lumbosacral fusion. No appreciable soft tissue abnormality. IMPRESSION: Negative. Electronically Signed   By: Duanne Guess D.O.   On: 05/28/2023 15:04   DG Chest Port 1 View Result Date: 05/28/2023 CLINICAL DATA:  Shortness of breath EXAM: PORTABLE CHEST 1 VIEW COMPARISON:  01/22/2023 FINDINGS: Cardiomegaly. Aortic atherosclerosis. Right upper lobe airspace consolidation. Left lung is clear. No pleural effusion or pneumothorax. IMPRESSION: Right upper lobe pneumonia. Followup PA and lateral chest X-ray is recommended in 3-4 weeks following trial of antibiotic therapy to ensure resolution. Electronically Signed   By: Duanne Guess D.O.   On: 05/28/2023 12:58       Akio Hudnall M.D. [facility] 05/29/2023, 3:22 PM  Available via Epic secure chat 7am-7pm After 7 pm, please refer to night coverage provider listed on amion.

## 2023-05-29 NOTE — Progress Notes (Signed)
   05/29/23 2309  BiPAP/CPAP/SIPAP  Reason BIPAP/CPAP not in use Non-compliant

## 2023-05-30 DIAGNOSIS — I700 Atherosclerosis of aorta: Secondary | ICD-10-CM

## 2023-05-30 DIAGNOSIS — J9621 Acute and chronic respiratory failure with hypoxia: Secondary | ICD-10-CM | POA: Diagnosis not present

## 2023-05-30 DIAGNOSIS — J189 Pneumonia, unspecified organism: Secondary | ICD-10-CM | POA: Diagnosis not present

## 2023-05-30 DIAGNOSIS — J9601 Acute respiratory failure with hypoxia: Secondary | ICD-10-CM | POA: Diagnosis not present

## 2023-05-30 LAB — CBC
HCT: 30.4 % — ABNORMAL LOW (ref 36.0–46.0)
Hemoglobin: 9.1 g/dL — ABNORMAL LOW (ref 12.0–15.0)
MCH: 26.1 pg (ref 26.0–34.0)
MCHC: 29.9 g/dL — ABNORMAL LOW (ref 30.0–36.0)
MCV: 87.1 fL (ref 80.0–100.0)
Platelets: 73 10*3/uL — ABNORMAL LOW (ref 150–400)
RBC: 3.49 MIL/uL — ABNORMAL LOW (ref 3.87–5.11)
RDW: 20.2 % — ABNORMAL HIGH (ref 11.5–15.5)
WBC: 4.7 10*3/uL (ref 4.0–10.5)
nRBC: 0 % (ref 0.0–0.2)

## 2023-05-30 LAB — BASIC METABOLIC PANEL
Anion gap: 6 (ref 5–15)
BUN: 16 mg/dL (ref 8–23)
CO2: 25 mmol/L (ref 22–32)
Calcium: 8.7 mg/dL — ABNORMAL LOW (ref 8.9–10.3)
Chloride: 102 mmol/L (ref 98–111)
Creatinine, Ser: 0.8 mg/dL (ref 0.44–1.00)
GFR, Estimated: >60 mL/min (ref >60)
Glucose, Bld: 107 mg/dL — ABNORMAL HIGH (ref 70–99)
Potassium: 4.2 mmol/L (ref 3.5–5.1)
Sodium: 133 mmol/L — ABNORMAL LOW (ref 135–145)

## 2023-05-30 NOTE — TOC CM/SW Note (Signed)
Transition of Care [facility]) - Inpatient Brief Assessment   Patient Details  Name: Stephanie Alvarado MRN: [id number] Date of Birth: Mar 23, 1941  [facility] CM/SW Contact:    Larrie Kass, LCSW Phone Number: 05/30/2023, 10:01 AM   Clinical Narrative:   Transition of Care Department [facility]) has reviewed patient and no TOC needs have been identified at this time. We will continue to monitor patient advancement through interdisciplinary progression rounds. If new patient transition needs arise, please place a TOC consult.    Transition of Care Asessment: Insurance and Status: Insurance coverage has been reviewed Patient has primary care physician: Yes Home environment has been reviewed: home with spouse Prior level of function:: mod independent Prior/Current Home Services: No current home services Social Drivers of Health Review: SDOH reviewed no interventions necessary Readmission risk has been reviewed: Yes Transition of care needs: no transition of care needs at this time

## 2023-05-30 NOTE — Progress Notes (Addendum)
[facility]                                                                              [name], is a 82 y.o. female, DOB - 30-May-1941, ZOX:[id number] Admit date - 05/28/2023    Outpatient Primary MD for the patient is Stephanie Chimera, MD  LOS - 2  days  Chief Complaint  Patient presents with   Shortness of Breath   Hypoxemia       Brief summary   Patient is a 82 year old female with anemia, anxiety, CAD, carotid artery disease, chronic diastolic CHF, thyroid cyst, diverticulosis, DVT, PE, dysphagia, esophageal stricture, GERD, HTN, HLP, lumbar disc disease, mitral valve prolapse, peripheral neuropathy PFO, PVD, OSA on CPAP, history of stroke treated with tPA, vitamin B12 deficiency presented to ED with dyspnea, generalized weakness, malaise, fatigue, wheezing and productive cough.  She had a fall at home landing on her knees due to generalized weakness, uses O2 as needed.  No fever chills, chest pain, palpitations orthopnea or PND. Chest x-ray showed right upper lobe pneumonia. Right hip x-ray, bilateral knee x-ray negative for fractures COVID, flu, RSV PCR negative   Assessment & Plan    Principal Problem:   Acute on chronic respiratory failure with hypoxia (HCC), right upper lobe pneumonia -Placed on IV ceftriaxone, Zithromax -Flu, RSV, COVID-negative -Blood culture positive for staph hemolyticus, likely contaminant, DC vancomycin -Urine strep antigen negative, urine Legionella antigen pending -Repeat blood cultures 12/16 negative so far  -Continue IV Zithromax, Rocephin -Currently O2 sats 96 to 97% on on 3 L O2 via [place], at home on as needed O2.  Will need home O2 evaluation prior to discharge  Active Problems:   OSA (obstructive sleep apnea) - Continue CPAP at bedtime.     Essential hypertension -Continue metoprolol      GERD -Continue Protonix       CAD (coronary artery disease) -Currently no anginal symptoms, continue aspirin, beta-blocker,  imdur.     Lumbar stenosis -Continue pain control as needed     Chronic diastolic CHF (congestive heart failure) (HCC) -Seems to be euvolemic. -Continue metoprolol 50 mg p.o. twice daily. -BNP slightly elevated 306.9, will verify if patient is taking Lasix at home -2D echo 07/13/2021 had shown EF of 60 to 65%, G1 DD     Thrombocytopenia (HCC) PLT 73k, continue to follow     Depression Continue Abilify 2 mg p.o. every evening.     Hyperlipidemia   Atherosclerosis of aorta (HCC) -Continue statin      Neuropathy -Continue gabapentin   Estimated body mass index is 28.09 kg/m as calculated from the following:   Height as of this encounter: 5\' 8"  (1.727 m).   Weight as of this encounter: 83.8 kg.  Code Status: Full code DVT Prophylaxis:  SCDs Start: 05/28/23 1537   Level of Care: Level of care: Progressive Family Communication: Updated patient Disposition Plan:      Remains inpatient appropriate:   Improving, hopefully DC next 24 to 48 hours   Procedures:  None  Consultants:   None  Antimicrobials:   Anti-infectives (From admission, onward)  Start     Dose/Rate Route Frequency Ordered Stop   05/30/23 1200  vancomycin (VANCOREADY) IVPB 1250 mg/250 mL  Status:  Discontinued        1,250 mg 166.7 mL/hr over 90 Minutes Intravenous Every 24 hours 05/29/23 1327 05/30/23 1256   05/29/23 1500  cefTRIAXone (ROCEPHIN) 1 g in sodium chloride 0.9 % 100 mL IVPB        1 g 200 mL/hr over 30 Minutes Intravenous Every 24 hours 05/28/23 1537 06/02/23 1459   05/29/23 1500  azithromycin (ZITHROMAX) 500 mg in sodium chloride 0.9 % 250 mL IVPB        500 mg 250 mL/hr over 60 Minutes Intravenous Every 24 hours 05/28/23 1537 06/03/23 1459   05/29/23 1315  Vancomycin (VANCOCIN) 1,500 mg in sodium chloride 0.9 % 500 mL IVPB        1,500 mg 250 mL/hr over 120 Minutes Intravenous  Once 05/29/23 1223 05/29/23 1455   05/28/23 1315  cefTRIAXone (ROCEPHIN) 1 g in sodium chloride 0.9 % 100  mL IVPB        1 g 200 mL/hr over 30 Minutes Intravenous  Once 05/28/23 1306 05/28/23 1539   05/28/23 1315  azithromycin (ZITHROMAX) 500 mg in sodium chloride 0.9 % 250 mL IVPB  Status:  Discontinued        500 mg 250 mL/hr over 60 Minutes Intravenous  Once 05/28/23 1306 05/29/23 0900          Medications  ARIPiprazole  2 mg Oral QPM   aspirin EC  81 mg Oral Daily   gabapentin  600 mg Oral TID   ipratropium-albuterol  3 mL Nebulization BID   isosorbide mononitrate  15 mg Oral QHS   latanoprost  1 drop Both Eyes QHS   metoprolol tartrate  50 mg Oral BID   pantoprazole  40 mg Oral BID AC   rosuvastatin  10 mg Oral QHS      Subjective:   Stephanie Alvarado was seen and examined today.  No fevers or chills, slowly improving, no chest pain.  No acute issues overnight.   Objective:   Vitals:   05/29/23 2004 05/30/23 0441 05/30/23 1020 05/30/23 1407  BP: (!) 141/54 (!) 133/54 (!) 139/50 (!) 131/50  Pulse: 79 66 79 67  Resp: 20 17 19    Temp: 99 F (37.2 C) 98.3 F (36.8 C) 98.2 F (36.8 C) 98.2 F (36.8 C)  TempSrc: Oral Oral Oral Oral  SpO2: 95% 96% 97% 96%  Weight:      Height:        Intake/Output Summary (Last 24 hours) at 05/30/2023 1433 Last data filed at 05/30/2023 1408 Gross per 24 hour  Intake 120 ml  Output 950 ml  Net -830 ml     Wt Readings from Last 3 Encounters:  05/29/23 83.8 kg  03/13/23 87 kg  09/08/22 85.8 kg   Physical Exam General: Alert and oriented x 3, NAD Cardiovascular: S1 S2 clear, RRR.  Respiratory: Diminished breath sound at the bases, no wheezing Gastrointestinal: Soft, nontender, nondistended, NBS Ext: no pedal edema bilaterally Neuro: no new deficits Psych: Normal affect    Data Reviewed:  I have personally reviewed following labs    CBC Lab Results  Component Value Date   WBC 4.7 05/30/2023   RBC 3.49 (L) 05/30/2023   HGB 9.1 (L) 05/30/2023   HCT 30.4 (L) 05/30/2023   MCV 87.1 05/30/2023   MCH 26.1 05/30/2023   PLT  73 (L) 05/30/2023  MCHC 29.9 (L) 05/30/2023   RDW 20.2 (H) 05/30/2023   LYMPHSABS 0.6 (L) 05/28/2023   MONOABS 0.3 05/28/2023   EOSABS 0.1 05/28/2023   BASOSABS 0.1 05/28/2023     Last metabolic panel Lab Results  Component Value Date   NA 133 (L) 05/30/2023   K 4.2 05/30/2023   CL 102 05/30/2023   CO2 25 05/30/2023   BUN 16 05/30/2023   CREATININE 0.80 05/30/2023   GLUCOSE 107 (H) 05/30/2023   GFRNONAA >60 05/30/2023   GFRAA >60 12/25/2019   CALCIUM 8.7 (L) 05/30/2023   PHOS 3.0 07/11/2021   PROT 5.7 (L) 05/29/2023   ALBUMIN 3.0 (L) 05/29/2023   BILITOT 0.8 05/29/2023   ALKPHOS 64 05/29/2023   AST 14 (L) 05/29/2023   ALT 9 05/29/2023   ANIONGAP 6 05/30/2023    CBG (last 3)  No results for input(s): "GLUCAP" in the last 72 hours.    Coagulation Profile: No results for input(s): "INR", "PROTIME" in the last 168 hours.   Radiology Studies: I have personally reviewed the imaging studies  DG Knee Complete 4 Views Left Result Date: 05/28/2023 CLINICAL DATA:  Pain EXAM: LEFT KNEE - COMPLETE 4+ VIEW COMPARISON:  None Available. FINDINGS: Status post left total knee arthroplasty. Arthroplasty components are in their expected alignment without periprosthetic lucency or fracture. No significant knee joint effusion. Atherosclerotic vascular calcifications. Soft tissues within normal limits. IMPRESSION: Status post left total knee arthroplasty without evidence of hardware complication. Electronically Signed   By: Duanne Guess D.O.   On: 05/28/2023 15:05   DG Knee Complete 4 Views Right Result Date: 05/28/2023 CLINICAL DATA:  Pain EXAM: RIGHT KNEE - COMPLETE 4+ VIEW COMPARISON:  None Available. FINDINGS: Status post right total knee arthroplasty. Arthroplasty components are in their expected alignment without periprosthetic lucency or fracture. No significant knee joint effusion. Atherosclerotic vascular calcifications. Soft tissues within normal limits. IMPRESSION: Status  post right total knee arthroplasty without evidence of hardware complication. Electronically Signed   By: Duanne Guess D.O.   On: 05/28/2023 15:05   DG Hip Unilat W or Wo Pelvis 2-3 Views Right Result Date: 05/28/2023 CLINICAL DATA:  Right hip pain EXAM: DG HIP (WITH OR WITHOUT PELVIS) 2-3V RIGHT COMPARISON:  04/19/2016 FINDINGS: There is no evidence of hip fracture or dislocation. Mild degenerative changes of the right hip. Prior lumbosacral fusion. No appreciable soft tissue abnormality. IMPRESSION: Negative. Electronically Signed   By: Duanne Guess D.O.   On: 05/28/2023 15:04       Tashika Goodin M.D. [facility] 05/30/2023, 2:33 PM  Available via Epic secure chat 7am-7pm After 7 pm, please refer to night coverage provider listed on amion.

## 2023-05-30 NOTE — Plan of Care (Signed)
  Problem: Activity: Goal: Ability to tolerate increased activity will improve Outcome: Progressing   Problem: Clinical Measurements: Goal: Ability to maintain a body temperature in the normal range will improve Outcome: Progressing   Problem: Respiratory: Goal: Ability to maintain adequate ventilation will improve Outcome: Progressing Goal: Ability to maintain a clear airway will improve Outcome: Progressing   Problem: Education: Goal: Knowledge of General Education information will improve Description: Including pain rating scale, medication(s)/side effects and non-pharmacologic comfort measures Outcome: Progressing   

## 2023-05-30 NOTE — Progress Notes (Signed)
   05/30/23 1951  BiPAP/CPAP/SIPAP  BiPAP/CPAP/SIPAP Pt Type Adult  Reason BIPAP/CPAP not in use Non-compliant (pt refusing to wear cpap for the night)  BiPAP/CPAP /SiPAP Vitals  SpO2  (90% on RA)  Bilateral Breath Sounds Diminished

## 2023-05-30 NOTE — Plan of Care (Signed)
  Problem: Coping: Goal: Level of anxiety will decrease Outcome: Progressing   Problem: Pain Management: Goal: General experience of comfort will improve Outcome: Progressing   Problem: Safety: Goal: Ability to remain free from injury will improve Outcome: Progressing   Problem: Skin Integrity: Goal: Risk for impaired skin integrity will decrease Outcome: Progressing

## 2023-05-31 ENCOUNTER — Other Ambulatory Visit: Payer: Self-pay | Admitting: Cardiovascular Disease

## 2023-05-31 ENCOUNTER — Other Ambulatory Visit: Payer: Self-pay

## 2023-05-31 DIAGNOSIS — J189 Pneumonia, unspecified organism: Secondary | ICD-10-CM | POA: Diagnosis not present

## 2023-05-31 DIAGNOSIS — I2510 Atherosclerotic heart disease of native coronary artery without angina pectoris: Secondary | ICD-10-CM | POA: Diagnosis not present

## 2023-05-31 DIAGNOSIS — J9621 Acute and chronic respiratory failure with hypoxia: Secondary | ICD-10-CM | POA: Diagnosis not present

## 2023-05-31 DIAGNOSIS — J9601 Acute respiratory failure with hypoxia: Secondary | ICD-10-CM | POA: Diagnosis not present

## 2023-05-31 LAB — BASIC METABOLIC PANEL
Anion gap: 6 (ref 5–15)
BUN: 16 mg/dL (ref 8–23)
CO2: 24 mmol/L (ref 22–32)
Calcium: 8.9 mg/dL (ref 8.9–10.3)
Chloride: 105 mmol/L (ref 98–111)
Creatinine, Ser: 0.83 mg/dL (ref 0.44–1.00)
GFR, Estimated: >60 mL/min (ref >60)
Glucose, Bld: 146 mg/dL — ABNORMAL HIGH (ref 70–99)
Potassium: 4.4 mmol/L (ref 3.5–5.1)
Sodium: 135 mmol/L (ref 135–145)

## 2023-05-31 LAB — CULTURE, BLOOD (ROUTINE X 2): Special Requests: ADEQUATE

## 2023-05-31 LAB — CBC
HCT: 31.6 % — ABNORMAL LOW (ref 36.0–46.0)
Hemoglobin: 9.4 g/dL — ABNORMAL LOW (ref 12.0–15.0)
MCH: 26.3 pg (ref 26.0–34.0)
MCHC: 29.7 g/dL — ABNORMAL LOW (ref 30.0–36.0)
MCV: 88.3 fL (ref 80.0–100.0)
Platelets: 77 10*3/uL — ABNORMAL LOW (ref 150–400)
RBC: 3.58 MIL/uL — ABNORMAL LOW (ref 3.87–5.11)
RDW: 20.3 % — ABNORMAL HIGH (ref 11.5–15.5)
WBC: 3.9 10*3/uL — ABNORMAL LOW (ref 4.0–10.5)
nRBC: 0 % (ref 0.0–0.2)

## 2023-05-31 LAB — CULTURE, RESPIRATORY W GRAM STAIN: Gram Stain: NONE SEEN

## 2023-05-31 LAB — LEGIONELLA PNEUMOPHILA SEROGP 1 UR AG: L. pneumophila Serogp 1 Ur Ag: NEGATIVE

## 2023-05-31 MED ORDER — BENZONATATE 100 MG PO CAPS
200.0000 mg | ORAL_CAPSULE | Freq: Three times a day (TID) | ORAL | Status: DC
  Administered 2023-05-31 – 2023-06-01 (×4): 200 mg via ORAL
  Filled 2023-05-31 (×4): qty 2

## 2023-05-31 MED ORDER — FUROSEMIDE 20 MG PO TABS: 20.0000 mg | ORAL_TABLET | Freq: Every day | ORAL | Status: DC

## 2023-05-31 MED ORDER — SENNOSIDES-DOCUSATE SODIUM 8.6-50 MG PO TABS
2.0000 | ORAL_TABLET | Freq: Every day | ORAL | Status: DC
  Administered 2023-05-31: 2 via ORAL
  Filled 2023-05-31: qty 2

## 2023-05-31 MED ORDER — FUROSEMIDE 20 MG PO TABS
20.0000 mg | ORAL_TABLET | Freq: Every day | ORAL | Status: AC
  Administered 2023-05-31 – 2023-06-01 (×2): 20 mg via ORAL
  Filled 2023-05-31 (×2): qty 1

## 2023-05-31 MED ORDER — HYDROCOD POLI-CHLORPHE POLI ER 10-8 MG/5ML PO SUER
5.0000 mL | Freq: Two times a day (BID) | ORAL | Status: DC
  Administered 2023-05-31 – 2023-06-01 (×3): 5 mL via ORAL
  Filled 2023-05-31 (×3): qty 5

## 2023-05-31 NOTE — Progress Notes (Signed)
Pt refuses CPAP 

## 2023-05-31 NOTE — Evaluation (Signed)
Occupational Therapy Evaluation Patient Details Name: Stephanie Alvarado MRN: [id number] DOB: 08-24-1940 Today's Date: 05/31/2023   History of Present Illness Pt is an 82 yo female admitted with dyspnea, weakness and cough. Pt found to have pneumonia.  PMH: anxiety, CVA, CAD, CHF, DVT, PE, HTN, lumbar disc dx, mitral valve prolapse, PFO and has home O2 uses as needed.   Clinical Impression   Pt admitted with the above diagnosis and has the deficits outlined below. Pt would benefit from cont OT to increase independence with adls back to her baseline of mod I with occasional assist for socks and shoes. Pt lives alone but has a boyfriend that is frequently there and son and daughter that live close. Pt would benefit from someone being with her for first few days around the clock to ensure her safety as pt does have weakness in legs during transfers.  Rec HHOT at d/c.       If plan is discharge home, recommend the following: A little help with walking and/or transfers;A little help with bathing/dressing/bathroom;Assistance with cooking/housework;Assist for transportation    Functional Status Assessment  Patient has had a recent decline in their functional status and demonstrates the ability to make significant improvements in function in a reasonable and predictable amount of time.  Equipment Recommendations  None recommended by OT    Recommendations for Other Services       Precautions / Restrictions Precautions Precautions: Fall Precaution Comments: Pt fell getting off toilet prior to admit Restrictions Weight Bearing Restrictions Per Provider Order: No      Mobility Bed Mobility Overal bed mobility: Needs Assistance Bed Mobility: Sit to Supine, Supine to Sit     Supine to sit: Contact guard Sit to supine: Contact guard assist   General bed mobility comments: cues to use bedrails and do more for herself. Pt happy to take assist but did not need it.    Transfers Overall transfer  level: Needs assistance Equipment used: Rolling walker (2 wheels) Transfers: Sit to/from Stand, Bed to chair/wheelchair/BSC Sit to Stand: Min assist     Step pivot transfers: Contact guard assist     General transfer comment: cues for hand placement      Balance Overall balance assessment: History of Falls, Needs assistance Sitting-balance support: Feet supported Sitting balance-Leahy Scale: Good     Standing balance support: Bilateral upper extremity supported, Reliant on assistive device for balance Standing balance-Leahy Scale: Poor Standing balance comment: Pt very reliant on walker when up on her feet                           ADL either performed or assessed with clinical judgement   ADL Overall ADL's : Needs assistance/impaired Eating/Feeding: Independent;Sitting   Grooming: Wash/dry hands;Wash/dry face;Oral care;Supervision/safety;Standing Grooming Details (indicate cue type and reason): stood at sink for 5 minutes to groom with supervision Upper Body Bathing: Set up;Sitting   Lower Body Bathing: Minimal assistance;Sit to/from stand   Upper Body Dressing : Set up;Sitting   Lower Body Dressing: Minimal assistance;Sit to/from stand;Cueing for compensatory techniques   Toilet Transfer: Minimal assistance;BSC/3in1;Comfort height toilet;Grab bars;Standard walker Toilet Transfer Details (indicate cue type and reason): Pt walked to bathroom with walker and benefits from 3:1 over commode Toileting- Clothing Manipulation and Hygiene: Supervision/safety;Sitting/lateral lean       Functional mobility during ADLs: Rolling walker (2 wheels);Contact guard assist General ADL Comments: Pt limited with some LE adls due to inability to access  feet.  Pt does have reacher and sock aid at home to assist. Pt very kyphotic when standing during adls.     Vision Baseline Vision/History: 1 Wears glasses Ability to See in Adequate Light: 0 Adequate Patient Visual Report: No  change from baseline Vision Assessment?: Wears glasses for reading     Perception Perception: Within Functional Limits       Praxis Praxis: WFL       Pertinent Vitals/Pain Pain Assessment Pain Assessment: No/denies pain     Extremity/Trunk Assessment Upper Extremity Assessment Upper Extremity Assessment: LUE deficits/detail LUE Deficits / Details: AROM WFL Strength: generally 4/5 throughout. Pt states CVA affected LUE years ago. LUE Sensation: WNL LUE Coordination: decreased fine motor;decreased gross motor   Lower Extremity Assessment Lower Extremity Assessment: Defer to PT evaluation   Cervical / Trunk Assessment Cervical / Trunk Assessment: Kyphotic   Communication Communication Communication: No apparent difficulties   Cognition Arousal: Alert Behavior During Therapy: WFL for tasks assessed/performed Overall Cognitive Status: Within Functional Limits for tasks assessed                                 General Comments: no deficits noted     General Comments  Pt weak and unsteady on feet but did not need more than CG assist for most mobility.  Min assist needed for adls.    Exercises     Shoulder Instructions      Home Living Family/patient expects to be discharged to:: Private residence Living Arrangements: Alone Available Help at Discharge: Family;Friend(s);Available 24 hours/day Type of Home: House Home Access: Level entry     Home Layout: One level     Bathroom Shower/Tub: Walk-in shower;Door   Bathroom Toilet: Handicapped height     Home Equipment: Agricultural consultant (2 wheels);Shower seat;BSC/3in1          Prior Functioning/Environment Prior Level of Function : Needs assist       Physical Assist : ADLs (physical)   ADLs (physical): IADLs;Toileting;Dressing Mobility Comments: walks with walker at all times ADLs Comments: sometimes needs assist with socks and shoes. Has someone to clean her home.        OT Problem List:  Decreased strength;Impaired balance (sitting and/or standing)      OT Treatment/Interventions: Self-care/ADL training;Therapeutic activities;DME and/or AE instruction;Balance training    OT Goals(Current goals can be found in the care plan section) Acute Rehab OT Goals Patient Stated Goal: to be stronger and go home OT Goal Formulation: With patient Time For Goal Achievement: 06/14/23 Potential to Achieve Goals: Good ADL Goals Pt Will Perform Grooming: with modified independence;standing Pt Will Perform Lower Body Bathing: with supervision;sit to/from stand Pt Will Perform Lower Body Dressing: with modified independence;with adaptive equipment;sit to/from stand Additional ADL Goal #1: Pt will walk to bathroom and complete all toileting with 3:1 over commode and mod I  OT Frequency: Min 1X/week    Co-evaluation              AM-PAC OT "6 Clicks" Daily Activity     Outcome Measure Help from another person eating meals?: None Help from another person taking care of personal grooming?: A Little Help from another person toileting, which includes using toliet, bedpan, or urinal?: A Little Help from another person bathing (including washing, rinsing, drying)?: A Little Help from another person to put on and taking off regular upper body clothing?: A Little Help from another person to  put on and taking off regular lower body clothing?: A Little 6 Click Score: 19   End of Session Equipment Utilized During Treatment: Rolling walker (2 wheels) Nurse Communication: Mobility status  Activity Tolerance: Patient tolerated treatment well Patient left: in bed;with bed alarm set;with call bell/phone within reach  OT Visit Diagnosis: Unsteadiness on feet (R26.81)                Time: 0981-1914 OT Time Calculation (min): 28 min Charges:  OT General Charges $OT Visit: 1 Visit OT Evaluation $OT Eval Moderate Complexity: 1 Mod OT Treatments $Self Care/Home Management : 8-22 mins  Hope Budds 05/31/2023, 10:16 AM

## 2023-05-31 NOTE — Evaluation (Addendum)
Physical Therapy Evaluation Patient Details Name: Stephanie Alvarado MRN: [id number] DOB: 12-10-1940 Today's Date: 05/31/2023  History of Present Illness  Pt is an 82 yo female admitted with dyspnea, weakness and cough. Pt found to have pneumonia.  PMH: anxiety, CVA, CAD, CHF, DVT, PE, HTN, lumbar disc dx, mitral valve prolapse, PFO and has home O2 uses as needed.  Clinical Impression  On eval, pt required Min A for mobility. She walked ~25 feet with a RW. Distance was limited by fatigue and pt report of lightheadedness. Unable to get O2 sat reading with ambulation-pt has cold fingers. Discussed d/c plan-pt reports she plans to d/c home.  Recommend 24/7 supervision and assist initially for safety. Will recommend HHPT f/u as well.         If plan is discharge home, recommend the following: A little help with walking and/or transfers;A little help with bathing/dressing/bathroom;Assistance with cooking/housework;Assist for transportation;Help with stairs or ramp for entrance   Can travel by private vehicle        Equipment Recommendations None recommended by PT  Recommendations for Other Services       Functional Status Assessment Patient has had a recent decline in their functional status and demonstrates the ability to make significant improvements in function in a reasonable and predictable amount of time.     Precautions / Restrictions Precautions Precautions: Fall Precaution Comments: monitor O2 Restrictions Weight Bearing Restrictions Per Provider Order: No      Mobility  Bed Mobility               General bed mobility comments: oob in recliner    Transfers Overall transfer level: Needs assistance Equipment used: Rolling walker (2 wheels) Transfers: Sit to/from Stand Sit to Stand: Min assist           General transfer comment: Assist to rise, steady, control descent Cues for safety, technique, hand placement. Increased time    Ambulation/Gait Ambulation/Gait  assistance: Min assist Gait Distance (Feet): 25 Feet Assistive device: Rolling walker (2 wheels) Gait Pattern/deviations: Step-through pattern, Decreased stride length       General Gait Details: Min A to steady. Slow gait speed. Near shuffling steps. Cues for safety and for pt to increase step length bilaterally. Unable to get O2 sat reading-fingers cold  Stairs            Wheelchair Mobility     Tilt Bed    Modified Rankin (Stroke Patients Only)       Balance Overall balance assessment: Needs assistance, History of Falls         Standing balance support: Bilateral upper extremity supported, Reliant on assistive device for balance Standing balance-Leahy Scale: Poor                               Pertinent Vitals/Pain Pain Assessment Pain Assessment: No/denies pain    Home Living Family/patient expects to be discharged to:: Private residence Living Arrangements: Alone Available Help at Discharge: Family;Friend(s);Available 24 hours/day Type of Home: House Home Access: Level entry       Home Layout: One level Home Equipment: Agricultural consultant (2 wheels);Shower seat;BSC/3in1      Prior Function Prior Level of Function : Needs assist             Mobility Comments: walks with walker at all times ADLs Comments: sometimes needs assist with socks and shoes. Has someone to clean her home.  Extremity/Trunk Assessment   Upper Extremity Assessment Upper Extremity Assessment: Defer to OT evaluation    Lower Extremity Assessment Lower Extremity Assessment: Generalized weakness    Cervical / Trunk Assessment Cervical / Trunk Assessment: Kyphotic  Communication   Communication Communication: No apparent difficulties  Cognition Arousal: Alert Behavior During Therapy: WFL for tasks assessed/performed Overall Cognitive Status: Within Functional Limits for tasks assessed                                          General  Comments      Exercises     Assessment/Plan    PT Assessment Patient needs continued PT services  PT Problem List Decreased strength;Decreased activity tolerance;Decreased balance;Decreased mobility;Decreased knowledge of use of DME       PT Treatment Interventions DME instruction;Gait training;Functional mobility training;Therapeutic activities;Therapeutic exercise;Patient/family education;Balance training    PT Goals (Current goals can be found in the Care Plan section)  Acute Rehab PT Goals Patient Stated Goal: home PT Goal Formulation: With patient Time For Goal Achievement: 06/14/23 Potential to Achieve Goals: Good    Frequency Min 1X/week     Co-evaluation               AM-PAC PT "6 Clicks" Mobility  Outcome Measure Help needed turning from your back to your side while in a flat bed without using bedrails?: A Little Help needed moving from lying on your back to sitting on the side of a flat bed without using bedrails?: A Little Help needed moving to and from a bed to a chair (including a wheelchair)?: A Little Help needed standing up from a chair using your arms (e.g., wheelchair or bedside chair)?: A Little Help needed to walk in hospital room?: A Little Help needed climbing 3-5 steps with a railing? : A Lot 6 Click Score: 17    End of Session Equipment Utilized During Treatment: Gait belt Activity Tolerance: Patient limited by fatigue;Patient tolerated treatment well Patient left: in chair;with call bell/phone within reach;with chair alarm set   PT Visit Diagnosis: Muscle weakness (generalized) (M62.81);Difficulty in walking, not elsewhere classified (R26.2)    Time: 7829-5621 PT Time Calculation (min) (ACUTE ONLY): 16 min   Charges:   PT Evaluation $PT Eval Low Complexity: 1 Low   PT General Charges $$ ACUTE PT VISIT: 1 Visit            Faye Ramsay, PT Acute Rehabilitation  Office: [phone]

## 2023-05-31 NOTE — Plan of Care (Signed)
  Problem: Activity: Goal: Ability to tolerate increased activity will improve Outcome: Progressing   Problem: Clinical Measurements: Goal: Ability to maintain a body temperature in the normal range will improve Outcome: Progressing   Problem: Respiratory: Goal: Ability to maintain adequate ventilation will improve Outcome: Progressing Goal: Ability to maintain a clear airway will improve Outcome: Progressing   Problem: Education: Goal: Knowledge of General Education information will improve Description: Including pain rating scale, medication(s)/side effects and non-pharmacologic comfort measures Outcome: Progressing   Problem: Health Behavior/Discharge Planning: Goal: Ability to manage health-related needs will improve Outcome: Progressing   Problem: Clinical Measurements: Goal: Ability to maintain clinical measurements within normal limits will improve Outcome: Progressing Goal: Will remain free from infection Outcome: Progressing Goal: Diagnostic test results will improve Outcome: Progressing Goal: Respiratory complications will improve Outcome: Progressing Goal: Cardiovascular complication will be avoided Outcome: Progressing

## 2023-05-31 NOTE — Progress Notes (Signed)
[facility]                                                                              Monisha Deleo, is a 82 y.o. female, DOB - 12/09/1940, [id number] Admit date - 05/28/2023    Outpatient Primary MD for the patient is [name], MD  LOS - 3  days  Chief Complaint  Patient presents with   Shortness of Breath   Hypoxemia       Brief summary   Patient is a 82 year old female with anemia, anxiety, CAD, carotid artery disease, chronic diastolic CHF, thyroid cyst, diverticulosis, DVT, PE, dysphagia, esophageal stricture, GERD, HTN, HLP, lumbar disc disease, mitral valve prolapse, peripheral neuropathy PFO, PVD, OSA on CPAP, history of stroke treated with tPA, vitamin B12 deficiency presented to ED with dyspnea, generalized weakness, malaise, fatigue, wheezing and productive cough.  She had a fall at home landing on her knees due to generalized weakness, uses O2 as needed.  No fever chills, chest pain, palpitations orthopnea or PND. Chest x-ray showed right upper lobe pneumonia. Right hip x-ray, bilateral knee x-ray negative for fractures COVID, flu, RSV PCR negative   Assessment & Plan    Principal Problem:   Acute on chronic respiratory failure with hypoxia (HCC), right upper lobe pneumonia -Placed on IV ceftriaxone, Zithromax -Flu, RSV, COVID-negative -Blood culture positive for staph hemolyticus, likely contaminant, DC vancomycin -Urine strep antigen negative, urine Legionella antigen pending -Repeat blood cultures 12/16 negative so far  -Continue IV Zithromax, Rocephin -Overall improving, O2 weaned to 2 L, O2 sats 90 to 98%. -Will need home O2 evaluation prior to discharge -Placed on Tessalon Perles, Tussionex for the cough (states had streak of blood in the phlegm), DC'd aspirin  Active Problems:   OSA (obstructive sleep apnea) - Continue CPAP at bedtime.     Essential hypertension -Continue metoprolol      GERD -Continue Protonix        CAD (coronary artery disease) -Currently no anginal symptoms, continue beta-blocker, imdur. -Aspirin on hold due to mild hemoptysis, intractable coughing      Lumbar stenosis -Continue pain control as needed     Chronic diastolic CHF (congestive heart failure) (HCC) -Continue metoprolol 50 mg p.o. twice daily. -BNP slightly elevated 306.9, -2D echo 07/13/2021 had shown EF of 60 to 65%, G1 DD -Will place on Lasix 20 mg daily     Thrombocytopenia (HCC) PLT 73k, continue to follow     Depression Continue Abilify 2 mg p.o. every evening.     Hyperlipidemia   Atherosclerosis of aorta (HCC) -Continue statin      Neuropathy -Stable, continue gabapentin    Estimated body mass index is 28.09 kg/m as calculated from the following:   Height as of this encounter: 5\' 8"  (1.727 m).   Weight as of this encounter: 83.8 kg.  Code Status: Full code DVT Prophylaxis:  Place and maintain sequential compression device Start: 05/31/23 0657 SCDs Start: 05/28/23 1537   Level of Care: Level of care: Progressive Family Communication: Updated patient Disposition Plan:      Remains inpatient appropriate:   Improving, hopefully DC next 24  to 48 hours   Procedures:  None  Consultants:   None  Antimicrobials:   Anti-infectives (From admission, onward)    Start     Dose/Rate Route Frequency Ordered Stop   05/30/23 1200  vancomycin (VANCOREADY) IVPB 1250 mg/250 mL  Status:  Discontinued        1,250 mg 166.7 mL/hr over 90 Minutes Intravenous Every 24 hours 05/29/23 1327 05/30/23 1256   05/29/23 1500  cefTRIAXone (ROCEPHIN) 1 g in sodium chloride 0.9 % 100 mL IVPB        1 g 200 mL/hr over 30 Minutes Intravenous Every 24 hours 05/28/23 1537 06/02/23 1459   05/29/23 1500  azithromycin (ZITHROMAX) 500 mg in sodium chloride 0.9 % 250 mL IVPB        500 mg 250 mL/hr over 60 Minutes Intravenous Every 24 hours 05/28/23 1537 06/03/23 1459   05/29/23 1315  Vancomycin (VANCOCIN) 1,500 mg in sodium  chloride 0.9 % 500 mL IVPB        1,500 mg 250 mL/hr over 120 Minutes Intravenous  Once 05/29/23 1223 05/29/23 1455   05/28/23 1315  cefTRIAXone (ROCEPHIN) 1 g in sodium chloride 0.9 % 100 mL IVPB        1 g 200 mL/hr over 30 Minutes Intravenous  Once 05/28/23 1306 05/28/23 1539   05/28/23 1315  azithromycin (ZITHROMAX) 500 mg in sodium chloride 0.9 % 250 mL IVPB  Status:  Discontinued        500 mg 250 mL/hr over 60 Minutes Intravenous  Once 05/28/23 1306 05/29/23 0900          Medications  ARIPiprazole  2 mg Oral QPM   benzonatate  200 mg Oral TID   chlorpheniramine-HYDROcodone  5 mL Oral Q12H   gabapentin  600 mg Oral TID   ipratropium-albuterol  3 mL Nebulization BID   isosorbide mononitrate  15 mg Oral QHS   latanoprost  1 drop Both Eyes QHS   metoprolol tartrate  50 mg Oral BID   pantoprazole  40 mg Oral BID AC   rosuvastatin  10 mg Oral QHS      Subjective:   Alyxia Centeio was seen and examined today.  Still having coughing, having some streak of blood in phlegm.  No pain, no fevers or chills, overall slowly improving.  Objective:   Vitals:   05/31/23 0549 05/31/23 0856 05/31/23 1029 05/31/23 1157  BP: (!) 145/56  (!) 132/50 (!) 128/57  Pulse: 67  78 77  Resp: 18  18 18   Temp: 98.2 F (36.8 C)   98.3 F (36.8 C)  TempSrc: Oral   Oral  SpO2: 98% 100% 90% 98%  Weight:      Height:        Intake/Output Summary (Last 24 hours) at 05/31/2023 1219 Last data filed at 05/31/2023 0645 Gross per 24 hour  Intake 381.67 ml  Output 1850 ml  Net -1468.33 ml     Wt Readings from Last 3 Encounters:  05/29/23 83.8 kg  03/13/23 87 kg  09/08/22 85.8 kg   Physical Exam General: Alert and oriented x 3, NAD, cough Cardiovascular: S1 S2 clear, RRR.  Respiratory: Diminished breath sound at the bases Gastrointestinal: Soft, nontender, nondistended, NBS Ext: no pedal edema bilaterally Neuro: no new deficits Psych: Normal affect   Data Reviewed:  I have personally  reviewed following labs    CBC Lab Results  Component Value Date   WBC 3.9 (L) 05/31/2023   RBC 3.58 (L) 05/31/2023  HGB 9.4 (L) 05/31/2023   HCT 31.6 (L) 05/31/2023   MCV 88.3 05/31/2023   MCH 26.3 05/31/2023   PLT 77 (L) 05/31/2023   MCHC 29.7 (L) 05/31/2023   RDW 20.3 (H) 05/31/2023   LYMPHSABS 0.6 (L) 05/28/2023   MONOABS 0.3 05/28/2023   EOSABS 0.1 05/28/2023   BASOSABS 0.1 05/28/2023     Last metabolic panel Lab Results  Component Value Date   NA 135 05/31/2023   K 4.4 05/31/2023   CL 105 05/31/2023   CO2 24 05/31/2023   BUN 16 05/31/2023   CREATININE 0.83 05/31/2023   GLUCOSE 146 (H) 05/31/2023   GFRNONAA >60 05/31/2023   GFRAA >60 12/25/2019   CALCIUM 8.9 05/31/2023   PHOS 3.0 07/11/2021   PROT 5.7 (L) 05/29/2023   ALBUMIN 3.0 (L) 05/29/2023   BILITOT 0.8 05/29/2023   ALKPHOS 64 05/29/2023   AST 14 (L) 05/29/2023   ALT 9 05/29/2023   ANIONGAP 6 05/31/2023    CBG (last 3)  No results for input(s): "GLUCAP" in the last 72 hours.    Coagulation Profile: No results for input(s): "INR", "PROTIME" in the last 168 hours.   Radiology Studies: I have personally reviewed the imaging studies  No results found.      Thad Ranger M.D. [facility] 05/31/2023, 12:19 PM  Available via Epic secure chat 7am-7pm After 7 pm, please refer to night coverage provider listed on amion.

## 2023-06-01 ENCOUNTER — Other Ambulatory Visit (HOSPITAL_COMMUNITY): Payer: Self-pay

## 2023-06-01 ENCOUNTER — Other Ambulatory Visit: Payer: Self-pay

## 2023-06-01 DIAGNOSIS — I700 Atherosclerosis of aorta: Secondary | ICD-10-CM | POA: Diagnosis not present

## 2023-06-01 DIAGNOSIS — J9601 Acute respiratory failure with hypoxia: Secondary | ICD-10-CM | POA: Diagnosis not present

## 2023-06-01 DIAGNOSIS — J9621 Acute and chronic respiratory failure with hypoxia: Secondary | ICD-10-CM | POA: Diagnosis not present

## 2023-06-01 DIAGNOSIS — J189 Pneumonia, unspecified organism: Secondary | ICD-10-CM | POA: Diagnosis not present

## 2023-06-01 LAB — BASIC METABOLIC PANEL
Anion gap: 6 (ref 5–15)
BUN: 18 mg/dL (ref 8–23)
CO2: 30 mmol/L (ref 22–32)
Calcium: 9.2 mg/dL (ref 8.9–10.3)
Chloride: 100 mmol/L (ref 98–111)
Creatinine, Ser: 0.75 mg/dL (ref 0.44–1.00)
GFR, Estimated: >60 mL/min (ref >60)
Glucose, Bld: 113 mg/dL — ABNORMAL HIGH (ref 70–99)
Potassium: 4.4 mmol/L (ref 3.5–5.1)
Sodium: 136 mmol/L (ref 135–145)

## 2023-06-01 LAB — CBC
HCT: 32.6 % — ABNORMAL LOW (ref 36.0–46.0)
Hemoglobin: 9.5 g/dL — ABNORMAL LOW (ref 12.0–15.0)
MCH: 26 pg (ref 26.0–34.0)
MCHC: 29.1 g/dL — ABNORMAL LOW (ref 30.0–36.0)
MCV: 89.1 fL (ref 80.0–100.0)
Platelets: 86 10*3/uL — ABNORMAL LOW (ref 150–400)
RBC: 3.66 MIL/uL — ABNORMAL LOW (ref 3.87–5.11)
RDW: 19.9 % — ABNORMAL HIGH (ref 11.5–15.5)
WBC: 3 10*3/uL — ABNORMAL LOW (ref 4.0–10.5)
nRBC: 0 % (ref 0.0–0.2)

## 2023-06-01 MED ORDER — POTASSIUM CHLORIDE ER 10 MEQ PO TBCR
10.0000 meq | EXTENDED_RELEASE_TABLET | Freq: Every morning | ORAL | 2 refills | Status: DC
  Filled 2023-06-06 – 2023-06-09 (×2): qty 90, 90d supply, fill #0
  Filled 2023-06-13: qty 30, 30d supply, fill #0
  Filled 2023-07-03 – 2023-07-18 (×2): qty 30, 30d supply, fill #1

## 2023-06-01 MED ORDER — CEFPODOXIME PROXETIL 200 MG PO TABS
200.0000 mg | ORAL_TABLET | Freq: Two times a day (BID) | ORAL | 0 refills | Status: AC
  Filled 2023-06-01: qty 6, 3d supply, fill #0

## 2023-06-01 MED ORDER — AZITHROMYCIN 500 MG PO TABS
500.0000 mg | ORAL_TABLET | Freq: Every day | ORAL | 0 refills | Status: AC
  Filled 2023-06-01: qty 3, 3d supply, fill #0

## 2023-06-01 MED ORDER — BENZONATATE 200 MG PO CAPS
200.0000 mg | ORAL_CAPSULE | Freq: Three times a day (TID) | ORAL | 0 refills | Status: DC | PRN
  Filled 2023-06-01: qty 45, 15d supply, fill #0

## 2023-06-01 MED ORDER — AZITHROMYCIN 250 MG PO TABS
500.0000 mg | ORAL_TABLET | Freq: Every day | ORAL | Status: DC
  Administered 2023-06-01: 500 mg via ORAL
  Filled 2023-06-01: qty 2

## 2023-06-01 MED ORDER — ALBUTEROL SULFATE HFA 108 (90 BASE) MCG/ACT IN AERS
2.0000 | INHALATION_SPRAY | Freq: Four times a day (QID) | RESPIRATORY_TRACT | 2 refills | Status: DC | PRN
  Filled 2023-06-01: qty 6.7, 25d supply, fill #0

## 2023-06-01 NOTE — Discharge Summary (Signed)
Physician Discharge Summary   Patient: Stephanie Alvarado MRN: [id number] DOB: 04-30-1941  Admit date:     05/28/2023  Discharge date: 06/01/23  Discharge Physician: Thad Ranger   PCP: [name], MD   Recommendations at discharge:   Continue 2 L O2 via [place] (patient does have history of chronic respiratory failure on 2 to 3 L at home)  Continue Zithromax 500 mg daily for 3 days Continue Vantin 200 mg twice daily for 3 days   SATURATION QUALIFICATIONS: (This note is used to comply with regulatory documentation for home oxygen)   Patient Saturations on Room Air at Rest = 83%   Patient Saturations on Room Air while Ambulating = 82%   Patient Saturations on 2 Liters of oxygen while Ambulating = 92%    Discharge Diagnoses:    Acute on chronic respiratory failure with hypoxia (HCC)    Right upper lobe pneumonia   OSA (obstructive sleep apnea)   Essential hypertension   GERD   Chronic constipation   CAD (coronary artery disease)   Atherosclerosis of aorta (HCC)   Lumbar scoliosis   Chronic diastolic CHF (congestive heart failure) (HCC)   Thrombocytopenia (HCC)   Depression   Hyperlipidemia   Neuropathy     Hospital Course:  Patient is a 82 year old female with anemia, anxiety, CAD, carotid artery disease, chronic diastolic CHF, thyroid cyst, diverticulosis, DVT, PE, dysphagia, esophageal stricture, GERD, HTN, HLP, lumbar disc disease, mitral valve prolapse, peripheral neuropathy PFO, PVD, OSA on CPAP, history of stroke treated with tPA, vitamin B12 deficiency presented to ED with dyspnea, generalized weakness, malaise, fatigue, wheezing and productive cough.  She had a fall at home landing on her knees due to generalized weakness, uses O2 as needed.  No fever chills, chest pain, palpitations orthopnea or PND. Chest x-ray showed right upper lobe pneumonia. Right hip x-ray, bilateral knee x-ray negative for fractures COVID, flu, RSV PCR negative     Assessment and  Plan:   Acute on chronic respiratory failure with hypoxia (HCC), right upper lobe pneumonia -Patient was placed on IV ceftriaxone, Zithromax -Flu, RSV, COVID-negative -Blood culture positive for staph hemolyticus, likely contaminant, DC vancomycin -Urine strep antigen negative, urine Legionella antigen negative -Repeat blood cultures 12/16 negative so far  -Overall improving, O2 weaned to 2 L, O2 sats 90 to 98%.  Note, patient has history of chronic respiratory failure and on 2 to 3 L O2 via [place]. -Transition to oral Zithromax, Augmentin for 3 more days to complete course      OSA (obstructive sleep apnea) - Continue CPAP at bedtime.     Essential hypertension -Continue metoprolol      GERD -Continue Protonix       CAD (coronary artery disease) -Currently no anginal symptoms, continue beta-blocker, imdur.     Lumbar stenosis -Continue pain control as needed     Chronic diastolic CHF (congestive heart failure) (HCC) -Continue metoprolol 50 mg p.o. twice daily. -BNP slightly elevated 306.9, -2D echo 07/13/2021 had shown EF of 60 to 65%, G1 DD -Lasix resumed     Thrombocytopenia (HCC) PLT 73k, continue to follow     Depression Continue Abilify 2 mg p.o. every evening.     Hyperlipidemia   Atherosclerosis of aorta (HCC) -Continue statin      Neuropathy -Stable, continue gabapentin      Estimated body mass index is 28.09 kg/m as calculated from the following:   Height as of this encounter: 5\' 8"  (1.727 m).   Weight  as of this encounter: 83.8 kg.       Pain control - [employer] Controlled Substance Reporting System database was reviewed. and patient was instructed, not to drive, operate heavy machinery, perform activities at heights, swimming or participation in water activities or provide baby-sitting services while on Pain, Sleep and Anxiety Medications; until their outpatient Physician has advised to do so again. Also recommended to not to take more than  prescribed Pain, Sleep and Anxiety Medications.  Consultants: None Procedures performed: None Disposition: Home Diet recommendation:  Discharge Diet Orders (From admission, onward)     Start     Ordered   06/01/23 0000  Diet - low sodium heart healthy        06/01/23 0929            DISCHARGE MEDICATION: Allergies as of 06/01/2023       Reactions   Codeine Itching        Medication List     TAKE these medications    albuterol 108 (90 Base) MCG/ACT inhaler Commonly known as: VENTOLIN HFA Inhale 2 puffs into the lungs every 6 (six) hours as needed for wheezing or shortness of breath.   Allergy Relief Cetirizine 10 MG tablet Generic drug: cetirizine Take 10 mg by mouth at bedtime.   ARIPiprazole 2 MG tablet Commonly known as: ABILIFY Take 1 tablet (2 mg total) by mouth every evening.   aspirin EC 81 MG tablet Take 1 tablet (81 mg total) by mouth daily. Swallow whole.   azithromycin 500 MG tablet Commonly known as: ZITHROMAX Take 1 tablet (500 mg total) by mouth daily for 3 days.   benzonatate 200 MG capsule Commonly known as: TESSALON Take 1 capsule (200 mg total) by mouth 3 (three) times daily as needed for cough.   cefpodoxime 200 MG tablet Commonly known as: VANTIN Take 1 tablet (200 mg total) by mouth 2 (two) times daily for 3 days.   dicyclomine 20 MG tablet Commonly known as: BENTYL Take 1 tablet (20 mg total) by mouth 3 (three) times daily as needed for muscle spasms. What changed: reasons to take this   furosemide 40 MG tablet Commonly known as: LASIX Take 1 tablet (40 mg total) by mouth daily.   gabapentin 300 MG capsule Commonly known as: NEURONTIN Take 600 mg by mouth 3 (three) times daily.   hyoscyamine 0.125 MG tablet Commonly known as: Levsin 1-2 tabs every 4-6 hours prn   isosorbide mononitrate 30 MG 24 hr tablet Commonly known as: IMDUR Take 0.5 tablets (15 mg total) by mouth at bedtime.   latanoprost 0.005 % ophthalmic  solution Commonly known as: XALATAN Place 1 drop into both eyes at bedtime.   metoprolol tartrate 50 MG tablet Commonly known as: LOPRESSOR Take 1 tablet (50 mg total) by mouth 2 (two) times daily.   nitroGLYCERIN 0.4 MG SL tablet Commonly known as: NITROSTAT Place 0.4 mg under the tongue every 5 (five) minutes as needed for chest pain (TAKE UP TO 3 DOSES BEFORE CALLING 911).   nystatin cream Commonly known as: MYCOSTATIN Apply 1 Application topically 2 (two) times daily. What changed:  when to take this reasons to take this   OXYGEN Inhale 2 L/min into the lungs as needed (for shortness of breath).   pantoprazole 40 MG tablet Commonly known as: PROTONIX Take 40 mg by mouth 2 (two) times daily before a meal.   PEG 3350 17 GM/SCOOP Powd Mix 17 g (1 capful) in 8oz of liquid and  drink by mouth daily for constipation.   potassium chloride 10 MEQ tablet Commonly known as: KLOR-CON Take 1 tablet (10 mEq total) by mouth in the morning.   PRESCRIPTION MEDICATION See admin instructions. CPAP- At bedtime   rosuvastatin 10 MG tablet Commonly known as: CRESTOR Take 1 tablet (10 mg total) by mouth at bedtime.   tiZANidine 4 MG tablet Commonly known as: ZANAFLEX Take 4 mg by mouth daily as needed for muscle spasms.   TYLENOL 500 MG tablet Generic drug: acetaminophen Take 500-1,500 mg by mouth 2 (two) times daily as needed for mild pain or headache.               Durable Medical Equipment  (From admission, onward)           Start     Ordered   06/01/23 1118  For home use only DME oxygen  Once       Question Answer Comment  Length of Need 12 Months   Mode or (Route) Nasal cannula   Liters per Minute 2   Frequency Continuous (stationary and portable oxygen unit needed)   Oxygen conserving device Yes   Oxygen delivery system Gas      06/01/23 1117            Follow-up Information     [name], MD. Schedule an appointment as soon as possible for a  visit in 2 week(s).   Specialty: Family Medicine Why: for hospital follow-up Contact information: [address] [place] [address] [phone]         Inc[facility] Follow up.   Contact information: [address] [place] [address] [phone]         Jobe Gibbon, [facility] Follow up.   Specialty: Home Health Services Contact information: [address] [place] [address] [place] [address] [phone]         Luciano Cutter, MD. Schedule an appointment as soon as possible for a visit in 2 week(s).   Specialty: Pulmonary Disease Why: for hospital follow-up Contact information: [address] [place] [address] [phone]                Discharge Exam: Ceasar Mons Weights   05/29/23 0857  Weight: 83.8 kg   S: No acute complaints, hoping to go home today.  Did not sleep well last night.  Ambulated in the hallway, needed 2 L O2 via [place] (patient has chronic respiratory failure and on 2 to 3 L O2 via [place] at home)  BP (!) 124/59 (BP Location: Right Arm)   Pulse 63   Temp (!) 97.5 F (36.4 C) (Oral)   Resp 18   Ht 5\' 8"  (1.727 m)   Wt 83.8 kg   SpO2 94% Comment: O2 only in 1 nare  BMI 28.09 kg/m   Physical Exam General: Alert and oriented x 3, NAD Cardiovascular: S1 S2 clear, RRR.  Respiratory: CTAB, no wheezing Gastrointestinal: Soft, nontender, nondistended, NBS Ext: no pedal edema bilaterally Neuro: no new deficits Psych: Normal affect    Condition at discharge: fair  The results of significant diagnostics from this hospitalization (including imaging, microbiology, ancillary and laboratory) are listed below for reference.   Imaging Studies: DG Knee Complete 4 Views Left Result Date: 05/28/2023 CLINICAL DATA:  Pain EXAM: LEFT KNEE - COMPLETE 4+ VIEW COMPARISON:  None Available. FINDINGS: Status post left total knee arthroplasty. Arthroplasty components are in their expected  alignment without  periprosthetic lucency or fracture. No significant knee joint effusion. Atherosclerotic vascular calcifications. Soft tissues within normal limits. IMPRESSION: Status post left total knee arthroplasty without evidence of hardware complication. Electronically Signed   By: Duanne Guess D.O.   On: 05/28/2023 15:05   DG Knee Complete 4 Views Right Result Date: 05/28/2023 CLINICAL DATA:  Pain EXAM: RIGHT KNEE - COMPLETE 4+ VIEW COMPARISON:  None Available. FINDINGS: Status post right total knee arthroplasty. Arthroplasty components are in their expected alignment without periprosthetic lucency or fracture. No significant knee joint effusion. Atherosclerotic vascular calcifications. Soft tissues within normal limits. IMPRESSION: Status post right total knee arthroplasty without evidence of hardware complication. Electronically Signed   By: Duanne Guess D.O.   On: 05/28/2023 15:05   DG Hip Unilat W or Wo Pelvis 2-3 Views Right Result Date: 05/28/2023 CLINICAL DATA:  Right hip pain EXAM: DG HIP (WITH OR WITHOUT PELVIS) 2-3V RIGHT COMPARISON:  04/19/2016 FINDINGS: There is no evidence of hip fracture or dislocation. Mild degenerative changes of the right hip. Prior lumbosacral fusion. No appreciable soft tissue abnormality. IMPRESSION: Negative. Electronically Signed   By: Duanne Guess D.O.   On: 05/28/2023 15:04   DG Chest Port 1 View Result Date: 05/28/2023 CLINICAL DATA:  Shortness of breath EXAM: PORTABLE CHEST 1 VIEW COMPARISON:  01/22/2023 FINDINGS: Cardiomegaly. Aortic atherosclerosis. Right upper lobe airspace consolidation. Left lung is clear. No pleural effusion or pneumothorax. IMPRESSION: Right upper lobe pneumonia. Followup PA and lateral chest X-ray is recommended in 3-4 weeks following trial of antibiotic therapy to ensure resolution. Electronically Signed   By: Duanne Guess D.O.   On: 05/28/2023 12:58    Microbiology: Results for orders placed or performed during the hospital  encounter of 05/28/23  Resp panel by RT-PCR (RSV, Flu A&B, Covid) Anterior Nasal Swab     Status: None   Collection Time: 05/28/23 12:24 PM   Specimen: Anterior Nasal Swab  Result Value Ref Range Status   SARS Coronavirus 2 by RT PCR NEGATIVE NEGATIVE Final    Comment: (NOTE) SARS-CoV-2 target nucleic acids are NOT DETECTED.  The SARS-CoV-2 RNA is generally detectable in upper respiratory specimens during the acute phase of infection. The lowest concentration of SARS-CoV-2 viral copies this assay can detect is 138 copies/mL. A negative result does not preclude SARS-Cov-2 infection and should not be used as the sole basis for treatment or other patient management decisions. A negative result may occur with  improper specimen collection/handling, submission of specimen other than nasopharyngeal swab, presence of viral mutation(s) within the areas targeted by this assay, and inadequate number of viral copies(<138 copies/mL). A negative result must be combined with clinical observations, patient history, and epidemiological information. The expected result is Negative.  Fact Sheet for Patients:  BloggerCourse.com  Fact Sheet for Healthcare Providers:  SeriousBroker.it  This test is no t yet approved or cleared by the [place] FDA and  has been authorized for detection and/or diagnosis of SARS-CoV-2 by FDA under an Emergency Use Authorization (EUA). This EUA will remain  in effect (meaning this test can be used) for the duration of the COVID-19 declaration under Section 564(b)(1) of the Act, 21 U.S.C.section 360bbb-3(b)(1), unless the authorization is terminated  or revoked sooner.       Influenza A by PCR NEGATIVE NEGATIVE Final   Influenza B by PCR NEGATIVE NEGATIVE Final    Comment: (NOTE) The Xpert Xpress SARS-CoV-2/FLU/RSV plus assay is intended as an aid in the diagnosis of influenza  from Nasopharyngeal swab specimens  and should not be used as a sole basis for treatment. Nasal washings and aspirates are unacceptable for Xpert Xpress SARS-CoV-2/FLU/RSV testing.  Fact Sheet for Patients: BloggerCourse.com  Fact Sheet for Healthcare Providers: SeriousBroker.it  This test is not yet approved or cleared by the [place] FDA and has been authorized for detection and/or diagnosis of SARS-CoV-2 by FDA under an Emergency Use Authorization (EUA). This EUA will remain in effect (meaning this test can be used) for the duration of the COVID-19 declaration under Section 564(b)(1) of the Act, 21 U.S.C. section 360bbb-3(b)(1), unless the authorization is terminated or revoked.     Resp Syncytial Virus by PCR NEGATIVE NEGATIVE Final    Comment: (NOTE) Fact Sheet for Patients: BloggerCourse.com  Fact Sheet for Healthcare Providers: SeriousBroker.it  This test is not yet approved or cleared by the [place] FDA and has been authorized for detection and/or diagnosis of SARS-CoV-2 by FDA under an Emergency Use Authorization (EUA). This EUA will remain in effect (meaning this test can be used) for the duration of the COVID-19 declaration under Section 564(b)(1) of the Act, 21 U.S.C. section 360bbb-3(b)(1), unless the authorization is terminated or revoked.  Performed at [facility], [address] [place], [place] [address]   Culture, blood (routine x 2)     Status: Abnormal   Collection Time: 05/28/23  1:18 PM   Specimen: BLOOD  Result Value Ref Range Status   Specimen Description   Final    BLOOD LEFT ANTECUBITAL Performed at [facility], [address] [place] [address], [place], [place] [address]    Special Requests   Final    BOTTLES DRAWN AEROBIC AND ANAEROBIC Blood Culture adequate volume Performed at [facility], [address], [place], [place]  [address]    Culture  Setup Time   Final    GRAM POSITIVE COCCI IN CLUSTERS IN BOTH AEROBIC AND ANAEROBIC BOTTLES CRITICAL RESULT CALLED TO, READ BACK BY AND VERIFIED WITH: PHARMD J.LEGGE AT 1129 ON 05/29/2023 BY T.SAAD.    Culture (A)  Final    STAPHYLOCOCCUS HAEMOLYTICUS THE SIGNIFICANCE OF ISOLATING THIS ORGANISM FROM A SINGLE SET OF BLOOD CULTURES WHEN MULTIPLE SETS ARE DRAWN IS UNCERTAIN. PLEASE NOTIFY THE MICROBIOLOGY DEPARTMENT WITHIN ONE WEEK IF SPECIATION AND SENSITIVITIES ARE REQUIRED. Performed at [facility], [address], [place], [place] [address]    Report Status 05/31/2023 FINAL  Final  Culture, blood (routine x 2)     Status: None (Preliminary result)   Collection Time: 05/28/23  1:18 PM   Specimen: BLOOD  Result Value Ref Range Status   Specimen Description   Final    BLOOD RIGHT ANTECUBITAL Performed at [facility], [address] [place], [place] [address]    Special Requests   Final    BOTTLES DRAWN AEROBIC AND ANAEROBIC Blood Culture adequate volume Performed at [facility], [address] [place], [place] [address]    Culture   Final    NO GROWTH 4 DAYS Performed at [facility], [address], [place], [place] [address]    Report Status PENDING  Incomplete  Blood Culture ID Panel (Reflexed)     Status: Abnormal   Collection Time: 05/28/23  1:18 PM  Result Value Ref Range Status   Enterococcus faecalis NOT DETECTED NOT DETECTED Final   Enterococcus Faecium NOT DETECTED NOT DETECTED Final   Listeria monocytogenes NOT DETECTED NOT DETECTED Final  Staphylococcus species DETECTED (A) NOT DETECTED Final    Comment: CRITICAL RESULT CALLED TO, READ BACK BY AND VERIFIED WITH: PHARMD J.LEGGE AT 1129 ON 05/29/2023 BY T.SAAD.    Staphylococcus aureus (BCID) NOT DETECTED NOT DETECTED Final   Staphylococcus epidermidis NOT DETECTED NOT DETECTED Final   Staphylococcus lugdunensis NOT DETECTED NOT DETECTED Final    Streptococcus species NOT DETECTED NOT DETECTED Final   Streptococcus agalactiae NOT DETECTED NOT DETECTED Final   Streptococcus pneumoniae NOT DETECTED NOT DETECTED Final   Streptococcus pyogenes NOT DETECTED NOT DETECTED Final   A.calcoaceticus-baumannii NOT DETECTED NOT DETECTED Final   Bacteroides fragilis NOT DETECTED NOT DETECTED Final   Enterobacterales NOT DETECTED NOT DETECTED Final   Enterobacter cloacae complex NOT DETECTED NOT DETECTED Final   Escherichia coli NOT DETECTED NOT DETECTED Final   Klebsiella aerogenes NOT DETECTED NOT DETECTED Final   Klebsiella oxytoca NOT DETECTED NOT DETECTED Final   Klebsiella pneumoniae NOT DETECTED NOT DETECTED Final   Proteus species NOT DETECTED NOT DETECTED Final   Salmonella species NOT DETECTED NOT DETECTED Final   Serratia marcescens NOT DETECTED NOT DETECTED Final   Haemophilus influenzae NOT DETECTED NOT DETECTED Final   Neisseria meningitidis NOT DETECTED NOT DETECTED Final   Pseudomonas aeruginosa NOT DETECTED NOT DETECTED Final   Stenotrophomonas maltophilia NOT DETECTED NOT DETECTED Final   Candida albicans NOT DETECTED NOT DETECTED Final   Candida auris NOT DETECTED NOT DETECTED Final   Candida glabrata NOT DETECTED NOT DETECTED Final   Candida krusei NOT DETECTED NOT DETECTED Final   Candida parapsilosis NOT DETECTED NOT DETECTED Final   Candida tropicalis NOT DETECTED NOT DETECTED Final   Cryptococcus neoformans/gattii NOT DETECTED NOT DETECTED Final    Comment: Performed at [facility], [address], [place], [place] [address]  Expectorated Sputum Assessment w Gram Stain, Rflx to Resp Cult     Status: None   Collection Time: 05/28/23  4:09 PM   Specimen: Sputum  Result Value Ref Range Status   Specimen Description SPUTUM  Final   Special Requests NONE  Final   Sputum evaluation   Final    THIS SPECIMEN IS ACCEPTABLE FOR SPUTUM CULTURE Performed at [facility], [address],  [place], [place] [address]    Report Status 05/28/2023 FINAL  Final  Culture, Respiratory w Gram Stain     Status: None   Collection Time: 05/28/23  4:09 PM   Specimen: SPU  Result Value Ref Range Status   Specimen Description   Final    SPUTUM Performed at [facility], [address] [place], [place] [address]    Special Requests   Final    NONE Reflexed from [id number] Performed at [facility], [address], [place], [place] [address]    Gram Stain   Final    NO WBC SEEN FEW GRAM POSITIVE COCCI FEW GRAM NEGATIVE RODS    Culture   Final    ABUNDANT Normal respiratory flora-no Staph aureus or Pseudomonas seen Performed at [facility], [address], [place], [place] [address]    Report Status 05/31/2023 FINAL  Final  MRSA Next Gen by PCR, Nasal     Status: None   Collection Time: 05/29/23  2:53 PM   Specimen: Nasal Mucosa; Nasal Swab  Result Value Ref Range Status   MRSA by PCR Next Gen NOT DETECTED NOT DETECTED Final    Comment: (NOTE) The GeneXpert MRSA Assay (FDA approved  for NASAL specimens only), is one component of a comprehensive MRSA colonization surveillance program. It is not intended to diagnose MRSA infection nor to guide or monitor treatment for MRSA infections. Test performance is not FDA approved in patients less than 91 years old. Performed at [facility], [address] [place], [place] [address]   Culture, blood (Routine X 2) w Reflex to ID Panel     Status: None (Preliminary result)   Collection Time: 05/29/23  4:12 PM   Specimen: BLOOD RIGHT ARM  Result Value Ref Range Status   Specimen Description   Final    BLOOD RIGHT ARM Performed at [facility], [address], [place], [place] [address]    Special Requests   Final    BOTTLES DRAWN AEROBIC AND ANAEROBIC Blood Culture results may not be optimal due to an inadequate volume of blood received in culture bottles Performed at [facility], [address], [place], [place] [address]    Culture   Final    NO GROWTH 3 DAYS Performed at [facility], [address] [place], [place] [address]    Report Status PENDING  Incomplete  Culture, blood (Routine X 2) w Reflex to ID Panel     Status: None (Preliminary result)   Collection Time: 05/29/23  4:14 PM   Specimen: BLOOD RIGHT ARM  Result Value Ref Range Status   Specimen Description   Final    BLOOD RIGHT ARM Performed at [facility], [address] [place], [place] [address]    Special Requests   Final    BOTTLES DRAWN AEROBIC AND ANAEROBIC Blood Culture results may not be optimal due to an inadequate volume of blood received in culture bottles Performed at [facility], [address] [place], [place] [address]    Culture   Final    NO GROWTH 3 DAYS Performed at [facility], [address], [place], [place] [address]    Report Status PENDING  Incomplete    Labs: CBC: Recent Labs  Lab 05/28/23 1318 05/29/23 0421 05/30/23 0412 05/31/23 0426 06/01/23 0441  WBC 6.5 6.4 4.7 3.9* 3.0*  NEUTROABS 5.3  --   --   --   --   HGB 11.6* 9.6* 9.1* 9.4* 9.5*  HCT 37.9 32.1* 30.4* 31.6* 32.6*  MCV 85.9 87.7 87.1 88.3 89.1  PLT PLATELET CLUMPS NOTED ON SMEAR, UNABLE TO ESTIMATE PLATELET CLUMPS NOTED ON SMEAR, UNABLE TO ESTIMATE 73* 77* 86*   Basic Metabolic Panel: Recent Labs  Lab 05/28/23 1318 05/29/23 0421 05/30/23 0412 05/31/23 0426 06/01/23 0441  NA 134* 133* 133* 135 136  K 4.0 3.9 4.2 4.4 4.4  CL 100 103 102 105 100  CO2 28 27 25 24 30   GLUCOSE 102* 114* 107* 146* 113*  BUN 20 19 16 16 18   CREATININE 1.01* 0.90 0.80 0.83 0.75  CALCIUM 9.3 8.7* 8.7* 8.9 9.2   Liver Function Tests: Recent Labs  Lab 05/29/23 0421  AST 14*  ALT 9  ALKPHOS 64  BILITOT 0.8  PROT 5.7*  ALBUMIN 3.0*   CBG: No results for input(s): "GLUCAP" in the last 168 hours.  Discharge time spent: greater than 30  minutes.  Signed: Thad Ranger, MD [facility] 06/01/2023

## 2023-06-01 NOTE — Plan of Care (Signed)

## 2023-06-01 NOTE — Progress Notes (Addendum)
SATURATION QUALIFICATIONS: (This note is used to comply with regulatory documentation for home oxygen)  Patient Saturations on Room Air at Rest = 83%  Patient Saturations on Room Air while Ambulating = 82%  Patient Saturations on 2 Liters of oxygen while Ambulating = 92%  Please briefly explain why patient needs home oxygen:

## 2023-06-01 NOTE — TOC Initial Note (Signed)
Transition of Care [facility]) - Initial/Assessment Note    Patient Details  Name: Stephanie Alvarado MRN: [id number] Date of Birth: January 01, 1941  Transition of Care [facility]) CM/SW Contact:    Adrian Prows, RN Phone Number: 06/01/2023, 10:11 AM  Clinical Narrative:                 TOC for d/c planning; spoke w/ pt in room; pt says she lives at home; she will d/c to [address] [place], [phone]; pt says she has cane, walker, BSC, shower chair, and home oxygen w/ [employer]; pt says she has a travel tank and it will be bought to hospital prior to d/c; she agrees to receive recc HHPT/OT; pt does not have an agency preference; spoke w/ Marisue Humble at [facility]; she says agency can provide services; also notified Eusebio Me at [facility] of pt's location/d/c; contact info for both agencies placed in follow up provider section of d/c instructions; pt notified; no TOC needs.  Expected Discharge Plan: Home w Home Health Services Barriers to Discharge: No Barriers Identified   Patient Goals and CMS Choice Patient states their goals for this hospitalization and ongoing recovery are:: home CMS Medicare.gov Compare Post Acute Care list provided to:: Patient Choice offered to / list presented to : Patient [place] ownership interest in [facility].provided to:: Patient    Expected Discharge Plan and Services   Discharge Planning Services: CM Consult Post Acute Care Choice: Durable Medical Equipment, Home Health Living arrangements for the past 2 months: Single Family Home Expected Discharge Date: 06/01/23               DME Arranged: Oxygen DME Agency: Patsy Lager Date DME Agency Contacted: 06/01/23 Time DME Agency Contacted: 1006 Representative spoke with at DME Agency: Eusebio Me HH Arranged: PT, OT [facility] Agency: [facility] Date [facility] Agency Contacted: 06/01/23 Time HH Agency Contacted: 1009 Representative spoke with at [facility] Agency: Marisue Humble  Prior Living  Arrangements/Services Living arrangements for the past 2 months: Single Family Home Lives with:: Significant Other Patient language and need for interpreter reviewed:: Yes Do you feel safe going back to the place where you live?: Yes      Need for Family Participation in Patient Care: Yes (Comment) Care giver support system in place?: Yes (comment) Current home services: DME (cane, walker, BSC, shower chair; home oxygen) Criminal Activity/Legal Involvement Pertinent to Current Situation/Hospitalization: No - Comment as needed  Activities of Daily Living   ADL Screening (condition at time of admission) Independently performs ADLs?: Yes (appropriate for developmental age) Is the patient deaf or have difficulty hearing?: No Does the patient have difficulty seeing, even when wearing glasses/contacts?: Yes Does the patient have difficulty concentrating, remembering, or making decisions?: No  Permission Sought/Granted Permission sought to share information with : Case Manager    Share Information with NAME: Case Manager     Permission granted to share info w Relationship: Fonnie Birkenhead (S.O.) [phone]     Emotional Assessment Appearance:: Appears stated age Attitude/Demeanor/Rapport: Gracious Affect (typically observed): Accepting Orientation: : Oriented to Self, Oriented to Place, Oriented to  Time, Oriented to Situation Alcohol / Substance Use: Not Applicable Psych Involvement: No (comment)  Admission diagnosis:  Acute respiratory failure with hypoxia (HCC) [J96.01] Community acquired pneumonia of right upper lobe of lung [J18.9] Patient Active Problem List   Diagnosis Date Noted   Acute respiratory failure with hypoxia (HCC) 05/28/2023   Neuropathy 05/28/2023   Right upper lobe pneumonia  05/28/2023   Hyperlipidemia 05/16/2022   Nocturnal hypoxemia 11/11/2021   Opacity of lung on imaging study 07/13/2021   Acute saddle pulmonary embolism (HCC) 07/12/2021   Acute on  chronic respiratory failure with hypoxia (HCC) 07/12/2021   Chest pain 07/11/2021   Daytime sleepiness 07/10/2021   AKI (acute kidney injury) (HCC) 07/08/2021   Chronic diastolic CHF (congestive heart failure) (HCC) 07/07/2021   Thrombocytopenia (HCC) 07/07/2021   Chronic respiratory failure with hypoxia, on home oxygen therapy (HCC) 07/07/2021   Hypokalemia 07/07/2021   Primary osteoarthritis of left knee 12/23/2019   Diarrhea 09/06/2017   Chronic RLQ pain 05/26/2017   Lightheadedness    Ataxia 05/30/2016   Dizziness 05/30/2016   Preoperative clearance 11/10/2015   Varicose veins of left lower extremity with complications 05/11/2015   Varicose veins of leg with complications 10/21/2014   Lumbar scoliosis 11/21/2013   Preop cardiovascular exam 10/29/2013   LLQ abdominal pain 04/05/2013   Diverticulitis 04/05/2013   Depression 03/19/2012   Palpitations    Subclavian artery disease (HCC)    PFO (patent foramen ovale)    CAD (coronary artery disease)    Ejection fraction    Atherosclerosis of aorta (HCC)    Coronary artery disease involving native coronary artery of native heart without angina pectoris    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   Chronic constipation 06/11/2010   ABDOMINAL PAIN-LLQ 06/11/2010   History of adenomatous polyp of colon 06/11/2010   OSA (obstructive sleep apnea) 07/17/2009   Stroke (HCC) 11/11/2008   COLONIC POLYPS 08/31/2008   Thyroid nodule 08/31/2008   VENOUS INSUFFICIENCY 08/31/2008   Diverticulosis of colon 08/31/2008   LOW BACK PAIN, CHRONIC 08/31/2008   FIBROMYALGIA 08/31/2008   Dyspnea 08/08/2008   HYPERCHOLESTEROLEMIA 04/19/2007   OBESITY 04/19/2007   ANEMIA 04/19/2007   ANXIETY 04/19/2007   Essential hypertension 04/19/2007   ALLERGIC RHINITIS 04/19/2007   PULMONARY NODULE, RIGHT LOWER LOBE 04/19/2007   GERD 04/19/2007   OA (osteoarthritis) of knee 04/19/2007   PCP:  [name], MD Pharmacy:   [facility] [address] [place] [address] Phone: [phone] Fax: [phone]     Social Drivers of Health (SDOH) Social History: SDOH Screenings   Food Insecurity: No Food Insecurity (05/28/2023)  Housing: Low Risk  (05/28/2023)  Transportation Needs: No Transportation Needs (05/28/2023)  Utilities: Not At Risk (05/28/2023)  Social Connections: Unknown (10/27/2021)   Received from [facility]  Tobacco Use: Medium Risk (05/28/2023)   SDOH Interventions:     Readmission Risk Interventions     No data to display

## 2023-06-02 DIAGNOSIS — Z9981 Dependence on supplemental oxygen: Secondary | ICD-10-CM | POA: Diagnosis not present

## 2023-06-02 DIAGNOSIS — Z09 Encounter for follow-up examination after completed treatment for conditions other than malignant neoplasm: Secondary | ICD-10-CM | POA: Diagnosis not present

## 2023-06-02 DIAGNOSIS — J189 Pneumonia, unspecified organism: Secondary | ICD-10-CM | POA: Diagnosis not present

## 2023-06-02 LAB — CULTURE, BLOOD (ROUTINE X 2)
Culture: NO GROWTH
Special Requests: ADEQUATE

## 2023-06-03 DIAGNOSIS — I5032 Chronic diastolic (congestive) heart failure: Secondary | ICD-10-CM | POA: Diagnosis not present

## 2023-06-03 DIAGNOSIS — I83892 Varicose veins of left lower extremities with other complications: Secondary | ICD-10-CM | POA: Diagnosis not present

## 2023-06-03 DIAGNOSIS — E7800 Pure hypercholesterolemia, unspecified: Secondary | ICD-10-CM | POA: Diagnosis not present

## 2023-06-03 DIAGNOSIS — I110 Hypertensive heart disease with heart failure: Secondary | ICD-10-CM | POA: Diagnosis not present

## 2023-06-03 DIAGNOSIS — M5136 Other intervertebral disc degeneration, lumbar region with discogenic back pain only: Secondary | ICD-10-CM | POA: Diagnosis not present

## 2023-06-03 DIAGNOSIS — G4733 Obstructive sleep apnea (adult) (pediatric): Secondary | ICD-10-CM | POA: Diagnosis not present

## 2023-06-03 DIAGNOSIS — I739 Peripheral vascular disease, unspecified: Secondary | ICD-10-CM | POA: Diagnosis not present

## 2023-06-03 DIAGNOSIS — R1031 Right lower quadrant pain: Secondary | ICD-10-CM | POA: Diagnosis not present

## 2023-06-03 DIAGNOSIS — I2510 Atherosclerotic heart disease of native coronary artery without angina pectoris: Secondary | ICD-10-CM | POA: Diagnosis not present

## 2023-06-03 DIAGNOSIS — I872 Venous insufficiency (chronic) (peripheral): Secondary | ICD-10-CM | POA: Diagnosis not present

## 2023-06-03 DIAGNOSIS — F32A Depression, unspecified: Secondary | ICD-10-CM | POA: Diagnosis not present

## 2023-06-03 DIAGNOSIS — J4489 Other specified chronic obstructive pulmonary disease: Secondary | ICD-10-CM | POA: Diagnosis not present

## 2023-06-03 DIAGNOSIS — J9621 Acute and chronic respiratory failure with hypoxia: Secondary | ICD-10-CM | POA: Diagnosis not present

## 2023-06-03 DIAGNOSIS — G629 Polyneuropathy, unspecified: Secondary | ICD-10-CM | POA: Diagnosis not present

## 2023-06-03 DIAGNOSIS — M48061 Spinal stenosis, lumbar region without neurogenic claudication: Secondary | ICD-10-CM | POA: Diagnosis not present

## 2023-06-03 DIAGNOSIS — K5790 Diverticulosis of intestine, part unspecified, without perforation or abscess without bleeding: Secondary | ICD-10-CM | POA: Diagnosis not present

## 2023-06-03 DIAGNOSIS — F411 Generalized anxiety disorder: Secondary | ICD-10-CM | POA: Diagnosis not present

## 2023-06-03 DIAGNOSIS — K219 Gastro-esophageal reflux disease without esophagitis: Secondary | ICD-10-CM | POA: Diagnosis not present

## 2023-06-03 DIAGNOSIS — D696 Thrombocytopenia, unspecified: Secondary | ICD-10-CM | POA: Diagnosis not present

## 2023-06-03 DIAGNOSIS — J189 Pneumonia, unspecified organism: Secondary | ICD-10-CM | POA: Diagnosis not present

## 2023-06-03 DIAGNOSIS — M1712 Unilateral primary osteoarthritis, left knee: Secondary | ICD-10-CM | POA: Diagnosis not present

## 2023-06-03 DIAGNOSIS — D649 Anemia, unspecified: Secondary | ICD-10-CM | POA: Diagnosis not present

## 2023-06-03 DIAGNOSIS — I700 Atherosclerosis of aorta: Secondary | ICD-10-CM | POA: Diagnosis not present

## 2023-06-03 DIAGNOSIS — G8929 Other chronic pain: Secondary | ICD-10-CM | POA: Diagnosis not present

## 2023-06-03 LAB — CULTURE, BLOOD (ROUTINE X 2)
Culture: NO GROWTH
Culture: NO GROWTH

## 2023-06-05 ENCOUNTER — Other Ambulatory Visit: Payer: Self-pay

## 2023-06-06 ENCOUNTER — Other Ambulatory Visit: Payer: Self-pay

## 2023-06-08 DIAGNOSIS — R0902 Hypoxemia: Secondary | ICD-10-CM | POA: Diagnosis not present

## 2023-06-08 DIAGNOSIS — G4733 Obstructive sleep apnea (adult) (pediatric): Secondary | ICD-10-CM | POA: Diagnosis not present

## 2023-06-08 DIAGNOSIS — J449 Chronic obstructive pulmonary disease, unspecified: Secondary | ICD-10-CM | POA: Diagnosis not present

## 2023-06-09 ENCOUNTER — Other Ambulatory Visit: Payer: Self-pay

## 2023-06-09 ENCOUNTER — Other Ambulatory Visit (HOSPITAL_COMMUNITY): Payer: Self-pay

## 2023-06-13 ENCOUNTER — Other Ambulatory Visit: Payer: Self-pay

## 2023-06-13 ENCOUNTER — Other Ambulatory Visit (HOSPITAL_COMMUNITY): Payer: Self-pay

## 2023-06-15 ENCOUNTER — Other Ambulatory Visit: Payer: Self-pay

## 2023-06-15 DIAGNOSIS — E7849 Other hyperlipidemia: Secondary | ICD-10-CM | POA: Diagnosis not present

## 2023-06-15 DIAGNOSIS — Z0001 Encounter for general adult medical examination with abnormal findings: Secondary | ICD-10-CM | POA: Diagnosis not present

## 2023-06-15 DIAGNOSIS — I700 Atherosclerosis of aorta: Secondary | ICD-10-CM | POA: Diagnosis not present

## 2023-06-15 DIAGNOSIS — R270 Ataxia, unspecified: Secondary | ICD-10-CM | POA: Diagnosis not present

## 2023-06-15 DIAGNOSIS — I10 Essential (primary) hypertension: Secondary | ICD-10-CM | POA: Diagnosis not present

## 2023-06-15 DIAGNOSIS — I8391 Asymptomatic varicose veins of right lower extremity: Secondary | ICD-10-CM | POA: Diagnosis not present

## 2023-06-15 DIAGNOSIS — E038 Other specified hypothyroidism: Secondary | ICD-10-CM | POA: Diagnosis not present

## 2023-06-15 DIAGNOSIS — R7301 Impaired fasting glucose: Secondary | ICD-10-CM | POA: Diagnosis not present

## 2023-06-15 DIAGNOSIS — D696 Thrombocytopenia, unspecified: Secondary | ICD-10-CM | POA: Diagnosis not present

## 2023-06-15 DIAGNOSIS — N1832 Chronic kidney disease, stage 3b: Secondary | ICD-10-CM | POA: Diagnosis not present

## 2023-06-15 DIAGNOSIS — I25111 Atherosclerotic heart disease of native coronary artery with angina pectoris with documented spasm: Secondary | ICD-10-CM | POA: Diagnosis not present

## 2023-06-15 DIAGNOSIS — J9611 Chronic respiratory failure with hypoxia: Secondary | ICD-10-CM | POA: Diagnosis not present

## 2023-06-20 ENCOUNTER — Other Ambulatory Visit (HOSPITAL_COMMUNITY): Payer: Self-pay

## 2023-06-30 ENCOUNTER — Other Ambulatory Visit (HOSPITAL_COMMUNITY): Payer: Self-pay

## 2023-06-30 MED ORDER — MIRABEGRON ER 25 MG PO TB24
25.0000 mg | ORAL_TABLET | Freq: Every day | ORAL | 3 refills | Status: DC
  Filled 2023-06-30 – 2023-07-18 (×3): qty 90, 90d supply, fill #0

## 2023-07-03 ENCOUNTER — Other Ambulatory Visit: Payer: Self-pay

## 2023-07-05 ENCOUNTER — Other Ambulatory Visit (HOSPITAL_COMMUNITY): Payer: Self-pay

## 2023-07-09 DIAGNOSIS — R0902 Hypoxemia: Secondary | ICD-10-CM | POA: Diagnosis not present

## 2023-07-09 DIAGNOSIS — G4733 Obstructive sleep apnea (adult) (pediatric): Secondary | ICD-10-CM | POA: Diagnosis not present

## 2023-07-09 DIAGNOSIS — J449 Chronic obstructive pulmonary disease, unspecified: Secondary | ICD-10-CM | POA: Diagnosis not present

## 2023-07-10 ENCOUNTER — Other Ambulatory Visit: Payer: Self-pay

## 2023-07-13 ENCOUNTER — Emergency Department (HOSPITAL_COMMUNITY): Payer: PPO

## 2023-07-13 ENCOUNTER — Inpatient Hospital Stay (HOSPITAL_COMMUNITY): Payer: PPO

## 2023-07-13 ENCOUNTER — Other Ambulatory Visit (HOSPITAL_COMMUNITY): Payer: PPO

## 2023-07-13 ENCOUNTER — Inpatient Hospital Stay (HOSPITAL_COMMUNITY)
Admission: EM | Admit: 2023-07-13 | Discharge: 2023-08-12 | DRG: 064 | Disposition: E | Payer: PPO | Attending: Neurology | Admitting: Neurology

## 2023-07-13 DIAGNOSIS — R509 Fever, unspecified: Secondary | ICD-10-CM | POA: Diagnosis not present

## 2023-07-13 DIAGNOSIS — E872 Acidosis, unspecified: Secondary | ICD-10-CM | POA: Diagnosis present

## 2023-07-13 DIAGNOSIS — Z7189 Other specified counseling: Secondary | ICD-10-CM | POA: Diagnosis not present

## 2023-07-13 DIAGNOSIS — R911 Solitary pulmonary nodule: Secondary | ICD-10-CM | POA: Diagnosis not present

## 2023-07-13 DIAGNOSIS — J690 Pneumonitis due to inhalation of food and vomit: Secondary | ICD-10-CM | POA: Diagnosis not present

## 2023-07-13 DIAGNOSIS — R1310 Dysphagia, unspecified: Secondary | ICD-10-CM | POA: Diagnosis present

## 2023-07-13 DIAGNOSIS — J9600 Acute respiratory failure, unspecified whether with hypoxia or hypercapnia: Secondary | ICD-10-CM | POA: Diagnosis not present

## 2023-07-13 DIAGNOSIS — R29727 NIHSS score 27: Secondary | ICD-10-CM | POA: Diagnosis not present

## 2023-07-13 DIAGNOSIS — J9621 Acute and chronic respiratory failure with hypoxia: Secondary | ICD-10-CM | POA: Diagnosis not present

## 2023-07-13 DIAGNOSIS — I611 Nontraumatic intracerebral hemorrhage in hemisphere, cortical: Principal | ICD-10-CM | POA: Diagnosis present

## 2023-07-13 DIAGNOSIS — E876 Hypokalemia: Secondary | ICD-10-CM | POA: Diagnosis not present

## 2023-07-13 DIAGNOSIS — I639 Cerebral infarction, unspecified: Secondary | ICD-10-CM | POA: Diagnosis not present

## 2023-07-13 DIAGNOSIS — Z9981 Dependence on supplemental oxygen: Secondary | ICD-10-CM

## 2023-07-13 DIAGNOSIS — Z8673 Personal history of transient ischemic attack (TIA), and cerebral infarction without residual deficits: Secondary | ICD-10-CM

## 2023-07-13 DIAGNOSIS — R404 Transient alteration of awareness: Secondary | ICD-10-CM | POA: Diagnosis not present

## 2023-07-13 DIAGNOSIS — G8191 Hemiplegia, unspecified affecting right dominant side: Secondary | ICD-10-CM | POA: Diagnosis not present

## 2023-07-13 DIAGNOSIS — Z4682 Encounter for fitting and adjustment of non-vascular catheter: Secondary | ICD-10-CM | POA: Diagnosis not present

## 2023-07-13 DIAGNOSIS — R918 Other nonspecific abnormal finding of lung field: Secondary | ICD-10-CM | POA: Diagnosis not present

## 2023-07-13 DIAGNOSIS — Z9049 Acquired absence of other specified parts of digestive tract: Secondary | ICD-10-CM

## 2023-07-13 DIAGNOSIS — I618 Other nontraumatic intracerebral hemorrhage: Secondary | ICD-10-CM | POA: Diagnosis not present

## 2023-07-13 DIAGNOSIS — Z6827 Body mass index (BMI) 27.0-27.9, adult: Secondary | ICD-10-CM

## 2023-07-13 DIAGNOSIS — I739 Peripheral vascular disease, unspecified: Secondary | ICD-10-CM | POA: Diagnosis present

## 2023-07-13 DIAGNOSIS — I615 Nontraumatic intracerebral hemorrhage, intraventricular: Secondary | ICD-10-CM | POA: Diagnosis not present

## 2023-07-13 DIAGNOSIS — R4701 Aphasia: Secondary | ICD-10-CM | POA: Diagnosis not present

## 2023-07-13 DIAGNOSIS — R41842 Visuospatial deficit: Secondary | ICD-10-CM | POA: Diagnosis present

## 2023-07-13 DIAGNOSIS — I10 Essential (primary) hypertension: Secondary | ICD-10-CM | POA: Diagnosis not present

## 2023-07-13 DIAGNOSIS — D61818 Other pancytopenia: Secondary | ICD-10-CM | POA: Diagnosis not present

## 2023-07-13 DIAGNOSIS — N179 Acute kidney failure, unspecified: Secondary | ICD-10-CM | POA: Diagnosis not present

## 2023-07-13 DIAGNOSIS — Z79899 Other long term (current) drug therapy: Secondary | ICD-10-CM

## 2023-07-13 DIAGNOSIS — G4489 Other headache syndrome: Secondary | ICD-10-CM | POA: Diagnosis not present

## 2023-07-13 DIAGNOSIS — I110 Hypertensive heart disease with heart failure: Secondary | ICD-10-CM | POA: Diagnosis not present

## 2023-07-13 DIAGNOSIS — Z7982 Long term (current) use of aspirin: Secondary | ICD-10-CM

## 2023-07-13 DIAGNOSIS — J439 Emphysema, unspecified: Secondary | ICD-10-CM | POA: Diagnosis present

## 2023-07-13 DIAGNOSIS — E785 Hyperlipidemia, unspecified: Secondary | ICD-10-CM | POA: Diagnosis not present

## 2023-07-13 DIAGNOSIS — Z515 Encounter for palliative care: Secondary | ICD-10-CM | POA: Diagnosis not present

## 2023-07-13 DIAGNOSIS — Z66 Do not resuscitate: Secondary | ICD-10-CM | POA: Diagnosis present

## 2023-07-13 DIAGNOSIS — I6523 Occlusion and stenosis of bilateral carotid arteries: Secondary | ICD-10-CM | POA: Diagnosis not present

## 2023-07-13 DIAGNOSIS — F411 Generalized anxiety disorder: Secondary | ICD-10-CM | POA: Diagnosis present

## 2023-07-13 DIAGNOSIS — D649 Anemia, unspecified: Secondary | ICD-10-CM | POA: Diagnosis not present

## 2023-07-13 DIAGNOSIS — Z96653 Presence of artificial knee joint, bilateral: Secondary | ICD-10-CM | POA: Diagnosis present

## 2023-07-13 DIAGNOSIS — J9601 Acute respiratory failure with hypoxia: Secondary | ICD-10-CM

## 2023-07-13 DIAGNOSIS — I517 Cardiomegaly: Secondary | ICD-10-CM | POA: Diagnosis not present

## 2023-07-13 DIAGNOSIS — E538 Deficiency of other specified B group vitamins: Secondary | ICD-10-CM | POA: Diagnosis present

## 2023-07-13 DIAGNOSIS — G629 Polyneuropathy, unspecified: Secondary | ICD-10-CM | POA: Diagnosis present

## 2023-07-13 DIAGNOSIS — I2510 Atherosclerotic heart disease of native coronary artery without angina pectoris: Secondary | ICD-10-CM | POA: Diagnosis present

## 2023-07-13 DIAGNOSIS — I5030 Unspecified diastolic (congestive) heart failure: Secondary | ICD-10-CM | POA: Diagnosis not present

## 2023-07-13 DIAGNOSIS — I161 Hypertensive emergency: Secondary | ICD-10-CM

## 2023-07-13 DIAGNOSIS — I5032 Chronic diastolic (congestive) heart failure: Secondary | ICD-10-CM | POA: Diagnosis not present

## 2023-07-13 DIAGNOSIS — G4733 Obstructive sleep apnea (adult) (pediatric): Secondary | ICD-10-CM | POA: Diagnosis present

## 2023-07-13 DIAGNOSIS — E041 Nontoxic single thyroid nodule: Secondary | ICD-10-CM | POA: Diagnosis not present

## 2023-07-13 DIAGNOSIS — Z90710 Acquired absence of both cervix and uterus: Secondary | ICD-10-CM

## 2023-07-13 DIAGNOSIS — G935 Compression of brain: Secondary | ICD-10-CM | POA: Diagnosis not present

## 2023-07-13 DIAGNOSIS — I4510 Unspecified right bundle-branch block: Secondary | ICD-10-CM | POA: Diagnosis not present

## 2023-07-13 DIAGNOSIS — R29818 Other symptoms and signs involving the nervous system: Secondary | ICD-10-CM | POA: Diagnosis not present

## 2023-07-13 DIAGNOSIS — G936 Cerebral edema: Secondary | ICD-10-CM | POA: Diagnosis not present

## 2023-07-13 DIAGNOSIS — I610 Nontraumatic intracerebral hemorrhage in hemisphere, subcortical: Secondary | ICD-10-CM

## 2023-07-13 DIAGNOSIS — Z86711 Personal history of pulmonary embolism: Secondary | ICD-10-CM

## 2023-07-13 DIAGNOSIS — I700 Atherosclerosis of aorta: Secondary | ICD-10-CM | POA: Diagnosis present

## 2023-07-13 DIAGNOSIS — E878 Other disorders of electrolyte and fluid balance, not elsewhere classified: Secondary | ICD-10-CM | POA: Diagnosis present

## 2023-07-13 DIAGNOSIS — I69391 Dysphagia following cerebral infarction: Secondary | ICD-10-CM | POA: Diagnosis not present

## 2023-07-13 DIAGNOSIS — Z885 Allergy status to narcotic agent status: Secondary | ICD-10-CM

## 2023-07-13 DIAGNOSIS — K219 Gastro-esophageal reflux disease without esophagitis: Secondary | ICD-10-CM | POA: Diagnosis present

## 2023-07-13 DIAGNOSIS — Z8249 Family history of ischemic heart disease and other diseases of the circulatory system: Secondary | ICD-10-CM

## 2023-07-13 DIAGNOSIS — I872 Venous insufficiency (chronic) (peripheral): Secondary | ICD-10-CM | POA: Diagnosis present

## 2023-07-13 DIAGNOSIS — Q2112 Patent foramen ovale: Secondary | ICD-10-CM

## 2023-07-13 DIAGNOSIS — R9431 Abnormal electrocardiogram [ECG] [EKG]: Secondary | ICD-10-CM | POA: Diagnosis not present

## 2023-07-13 DIAGNOSIS — Z955 Presence of coronary angioplasty implant and graft: Secondary | ICD-10-CM

## 2023-07-13 DIAGNOSIS — I672 Cerebral atherosclerosis: Secondary | ICD-10-CM | POA: Diagnosis not present

## 2023-07-13 DIAGNOSIS — E669 Obesity, unspecified: Secondary | ICD-10-CM | POA: Diagnosis not present

## 2023-07-13 DIAGNOSIS — Z860100 Personal history of colon polyps, unspecified: Secondary | ICD-10-CM

## 2023-07-13 DIAGNOSIS — I619 Nontraumatic intracerebral hemorrhage, unspecified: Principal | ICD-10-CM

## 2023-07-13 DIAGNOSIS — I6509 Occlusion and stenosis of unspecified vertebral artery: Secondary | ICD-10-CM | POA: Diagnosis not present

## 2023-07-13 DIAGNOSIS — Z86718 Personal history of other venous thrombosis and embolism: Secondary | ICD-10-CM

## 2023-07-13 DIAGNOSIS — J309 Allergic rhinitis, unspecified: Secondary | ICD-10-CM | POA: Diagnosis present

## 2023-07-13 DIAGNOSIS — Z87891 Personal history of nicotine dependence: Secondary | ICD-10-CM

## 2023-07-13 DIAGNOSIS — R0989 Other specified symptoms and signs involving the circulatory and respiratory systems: Secondary | ICD-10-CM | POA: Diagnosis not present

## 2023-07-13 LAB — CBC
HCT: 36.9 % (ref 36.0–46.0)
Hemoglobin: 11.3 g/dL — ABNORMAL LOW (ref 12.0–15.0)
MCH: 26.7 pg (ref 26.0–34.0)
MCHC: 30.6 g/dL (ref 30.0–36.0)
MCV: 87.2 fL (ref 80.0–100.0)
Platelets: 75 10*3/uL — ABNORMAL LOW (ref 150–400)
RBC: 4.23 MIL/uL (ref 3.87–5.11)
RDW: 19.6 % — ABNORMAL HIGH (ref 11.5–15.5)
WBC: 2 10*3/uL — ABNORMAL LOW (ref 4.0–10.5)
nRBC: 0 % (ref 0.0–0.2)

## 2023-07-13 LAB — I-STAT CHEM 8, ED
BUN: 18 mg/dL (ref 8–23)
Calcium, Ion: 1.19 mmol/L (ref 1.15–1.40)
Chloride: 104 mmol/L (ref 98–111)
Creatinine, Ser: 1.1 mg/dL — ABNORMAL HIGH (ref 0.44–1.00)
Glucose, Bld: 155 mg/dL — ABNORMAL HIGH (ref 70–99)
HCT: 34 % — ABNORMAL LOW (ref 36.0–46.0)
Hemoglobin: 11.6 g/dL — ABNORMAL LOW (ref 12.0–15.0)
Potassium: 3.7 mmol/L (ref 3.5–5.1)
Sodium: 138 mmol/L (ref 135–145)
TCO2: 28 mmol/L (ref 22–32)

## 2023-07-13 LAB — COMPREHENSIVE METABOLIC PANEL
ALT: 10 U/L (ref 0–44)
AST: 21 U/L (ref 15–41)
Albumin: 3.9 g/dL (ref 3.5–5.0)
Alkaline Phosphatase: 84 U/L (ref 38–126)
Anion gap: 13 (ref 5–15)
BUN: 17 mg/dL (ref 8–23)
CO2: 23 mmol/L (ref 22–32)
Calcium: 9.8 mg/dL (ref 8.9–10.3)
Chloride: 101 mmol/L (ref 98–111)
Creatinine, Ser: 1.13 mg/dL — ABNORMAL HIGH (ref 0.44–1.00)
GFR, Estimated: 48 mL/min — ABNORMAL LOW (ref >60)
Glucose, Bld: 160 mg/dL — ABNORMAL HIGH (ref 70–99)
Potassium: 3.7 mmol/L (ref 3.5–5.1)
Sodium: 137 mmol/L (ref 135–145)
Total Bilirubin: 1 mg/dL (ref 0.0–1.2)
Total Protein: 6.8 g/dL (ref 6.5–8.1)

## 2023-07-13 LAB — DIFFERENTIAL
Abs Immature Granulocytes: 0.09 10*3/uL — ABNORMAL HIGH (ref 0.00–0.07)
Basophils Absolute: 0 10*3/uL (ref 0.0–0.1)
Basophils Relative: 2 %
Eosinophils Absolute: 0.1 10*3/uL (ref 0.0–0.5)
Eosinophils Relative: 6 %
Immature Granulocytes: 5 %
Lymphocytes Relative: 49 %
Lymphs Abs: 1 10*3/uL (ref 0.7–4.0)
Monocytes Absolute: 0.1 10*3/uL (ref 0.1–1.0)
Monocytes Relative: 4 %
Neutro Abs: 0.7 10*3/uL — ABNORMAL LOW (ref 1.7–7.7)
Neutrophils Relative %: 34 %

## 2023-07-13 LAB — I-STAT ARTERIAL BLOOD GAS, ED
Acid-Base Excess: 3 mmol/L — ABNORMAL HIGH (ref 0.0–2.0)
Bicarbonate: 28 mmol/L (ref 20.0–28.0)
Calcium, Ion: 1.31 mmol/L (ref 1.15–1.40)
HCT: 31 % — ABNORMAL LOW (ref 36.0–46.0)
Hemoglobin: 10.5 g/dL — ABNORMAL LOW (ref 12.0–15.0)
O2 Saturation: 100 %
Potassium: 3.4 mmol/L — ABNORMAL LOW (ref 3.5–5.1)
Sodium: 137 mmol/L (ref 135–145)
TCO2: 29 mmol/L (ref 22–32)
pCO2 arterial: 43.5 mm[Hg] (ref 32–48)
pH, Arterial: 7.416 (ref 7.35–7.45)
pO2, Arterial: 375 mm[Hg] — ABNORMAL HIGH (ref 83–108)

## 2023-07-13 LAB — LIPID PANEL
Cholesterol: 149 mg/dL (ref 0–200)
HDL: 67 mg/dL (ref >40)
LDL Cholesterol: 63 mg/dL (ref 0–99)
Total CHOL/HDL Ratio: 2.2 {ratio}
Triglycerides: 97 mg/dL (ref <150)
VLDL: 19 mg/dL (ref 0–40)

## 2023-07-13 LAB — HEMOGLOBIN A1C
Hgb A1c MFr Bld: 5.5 % (ref 4.8–5.6)
Mean Plasma Glucose: 111.15 mg/dL

## 2023-07-13 LAB — GLUCOSE, CAPILLARY
Glucose-Capillary: 103 mg/dL — ABNORMAL HIGH (ref 70–99)
Glucose-Capillary: 138 mg/dL — ABNORMAL HIGH (ref 70–99)
Glucose-Capillary: 143 mg/dL — ABNORMAL HIGH (ref 70–99)
Glucose-Capillary: 144 mg/dL — ABNORMAL HIGH (ref 70–99)
Glucose-Capillary: 98 mg/dL (ref 70–99)

## 2023-07-13 LAB — MAGNESIUM
Magnesium: 1.8 mg/dL (ref 1.7–2.4)
Magnesium: 2.3 mg/dL (ref 1.7–2.4)

## 2023-07-13 LAB — PROTIME-INR
INR: 1.1 (ref 0.8–1.2)
Prothrombin Time: 14.4 s (ref 11.4–15.2)

## 2023-07-13 LAB — TYPE AND SCREEN
ABO/RH(D): O POS
Antibody Screen: NEGATIVE

## 2023-07-13 LAB — CBG MONITORING, ED: Glucose-Capillary: 171 mg/dL — ABNORMAL HIGH (ref 70–99)

## 2023-07-13 LAB — PHOSPHORUS
Phosphorus: 2.2 mg/dL — ABNORMAL LOW (ref 2.5–4.6)
Phosphorus: 2.7 mg/dL (ref 2.5–4.6)

## 2023-07-13 LAB — SODIUM
Sodium: 143 mmol/L (ref 135–145)
Sodium: 143 mmol/L (ref 135–145)

## 2023-07-13 LAB — APTT: aPTT: 24 s (ref 24–36)

## 2023-07-13 LAB — MRSA NEXT GEN BY PCR, NASAL: MRSA by PCR Next Gen: NOT DETECTED

## 2023-07-13 LAB — ETHANOL: Alcohol, Ethyl (B): <10 mg/dL (ref <10)

## 2023-07-13 LAB — PLATELET COUNT: Platelets: 80 10*3/uL — ABNORMAL LOW (ref 150–400)

## 2023-07-13 MED ORDER — GADOBUTROL 1 MMOL/ML IV SOLN
8.0000 mL | Freq: Once | INTRAVENOUS | Status: AC | PRN
  Administered 2023-07-13: 8 mL via INTRAVENOUS

## 2023-07-13 MED ORDER — MAGNESIUM SULFATE 2 GM/50ML IV SOLN
2.0000 g | Freq: Once | INTRAVENOUS | Status: AC
  Administered 2023-07-13: 2 g via INTRAVENOUS
  Filled 2023-07-13: qty 50

## 2023-07-13 MED ORDER — INSULIN ASPART 100 UNIT/ML IJ SOLN
3.0000 [IU] | INTRAMUSCULAR | Status: DC
  Administered 2023-07-13 – 2023-07-16 (×13): 3 [IU] via SUBCUTANEOUS

## 2023-07-13 MED ORDER — PROSOURCE TF20 ENFIT COMPATIBL EN LIQD
60.0000 mL | Freq: Two times a day (BID) | ENTERAL | Status: DC
  Administered 2023-07-13 – 2023-07-16 (×6): 60 mL
  Filled 2023-07-13 (×6): qty 60

## 2023-07-13 MED ORDER — ONDANSETRON HCL 4 MG/2ML IJ SOLN
4.0000 mg | Freq: Once | INTRAMUSCULAR | Status: AC
  Administered 2023-07-13: 4 mg via INTRAVENOUS

## 2023-07-13 MED ORDER — FENTANYL CITRATE PF 50 MCG/ML IJ SOSY
25.0000 ug | PREFILLED_SYRINGE | INTRAMUSCULAR | Status: DC | PRN
Start: 2023-07-13 — End: 2023-07-16
  Administered 2023-07-13 – 2023-07-16 (×6): 50 ug via INTRAVENOUS
  Filled 2023-07-13 (×7): qty 1

## 2023-07-13 MED ORDER — FAMOTIDINE 20 MG PO TABS
20.0000 mg | ORAL_TABLET | Freq: Two times a day (BID) | ORAL | Status: DC
  Administered 2023-07-13 – 2023-07-16 (×7): 20 mg
  Filled 2023-07-13 (×7): qty 1

## 2023-07-13 MED ORDER — OSMOLITE 1.5 CAL PO LIQD
1000.0000 mL | ORAL | Status: DC
  Administered 2023-07-13 – 2023-07-15 (×3): 1000 mL

## 2023-07-13 MED ORDER — IOHEXOL 350 MG/ML SOLN
75.0000 mL | Freq: Once | INTRAVENOUS | Status: AC | PRN
  Administered 2023-07-13: 75 mL via INTRAVENOUS

## 2023-07-13 MED ORDER — ORAL CARE MOUTH RINSE
15.0000 mL | OROMUCOSAL | Status: DC
  Administered 2023-07-13 – 2023-07-16 (×37): 15 mL via OROMUCOSAL

## 2023-07-13 MED ORDER — PROSOURCE TF20 ENFIT COMPATIBL EN LIQD
60.0000 mL | Freq: Every day | ENTERAL | Status: DC
Start: 2023-07-13 — End: 2023-07-13
  Administered 2023-07-13: 60 mL
  Filled 2023-07-13: qty 60

## 2023-07-13 MED ORDER — SODIUM CHLORIDE 3 % IV BOLUS
250.0000 mL | Freq: Once | INTRAVENOUS | Status: AC
  Administered 2023-07-13: 250 mL via INTRAVENOUS
  Filled 2023-07-13: qty 500

## 2023-07-13 MED ORDER — PROPOFOL 1000 MG/100ML IV EMUL
INTRAVENOUS | Status: AC
  Administered 2023-07-13: 5 ug/kg/min via INTRAVENOUS
  Filled 2023-07-13: qty 100

## 2023-07-13 MED ORDER — HYDRALAZINE HCL 20 MG/ML IJ SOLN: 10.0000 mg | INTRAMUSCULAR | Status: DC | PRN

## 2023-07-13 MED ORDER — SODIUM CHLORIDE 3 % IV SOLN
INTRAVENOUS | Status: DC
  Filled 2023-07-13 (×6): qty 500

## 2023-07-13 MED ORDER — PIVOT 1.5 CAL PO LIQD: 1000.0000 mL | ORAL | Status: DC

## 2023-07-13 MED ORDER — SENNOSIDES-DOCUSATE SODIUM 8.6-50 MG PO TABS
1.0000 | ORAL_TABLET | Freq: Two times a day (BID) | ORAL | Status: DC
  Administered 2023-07-13 – 2023-07-16 (×7): 1
  Filled 2023-07-13 (×7): qty 1

## 2023-07-13 MED ORDER — ADULT MULTIVITAMIN W/MINERALS CH
1.0000 | ORAL_TABLET | Freq: Every day | ORAL | Status: DC
  Administered 2023-07-13 – 2023-07-16 (×4): 1
  Filled 2023-07-13 (×4): qty 1

## 2023-07-13 MED ORDER — IPRATROPIUM-ALBUTEROL 0.5-2.5 (3) MG/3ML IN SOLN
3.0000 mL | Freq: Two times a day (BID) | RESPIRATORY_TRACT | Status: DC
  Filled 2023-07-13: qty 3

## 2023-07-13 MED ORDER — SODIUM CHLORIDE 0.9% FLUSH: 3.0000 mL | Freq: Once | INTRAVENOUS | Status: DC

## 2023-07-13 MED ORDER — ORAL CARE MOUTH RINSE
15.0000 mL | OROMUCOSAL | Status: DC | PRN
Start: 2023-07-13 — End: 2023-07-16

## 2023-07-13 MED ORDER — LATANOPROST 0.005 % OP SOLN
1.0000 [drp] | Freq: Every day | OPHTHALMIC | Status: DC
  Administered 2023-07-13 – 2023-07-15 (×3): 1 [drp] via OPHTHALMIC
  Filled 2023-07-13: qty 2.5

## 2023-07-13 MED ORDER — CLEVIDIPINE BUTYRATE 0.5 MG/ML IV EMUL
INTRAVENOUS | Status: AC
  Filled 2023-07-13: qty 100

## 2023-07-13 MED ORDER — THIAMINE MONONITRATE 100 MG PO TABS
100.0000 mg | ORAL_TABLET | Freq: Every day | ORAL | Status: DC
  Administered 2023-07-13 – 2023-07-16 (×4): 100 mg
  Filled 2023-07-13 (×4): qty 1

## 2023-07-13 MED ORDER — CLEVIDIPINE BUTYRATE 0.5 MG/ML IV EMUL
0.0000 mg/h | INTRAVENOUS | Status: AC
Start: 2023-07-13 — End: ?
  Administered 2023-07-13 (×2): 7 mg/h via INTRAVENOUS
  Administered 2023-07-13: 5 mg/h via INTRAVENOUS
  Administered 2023-07-13: 13 mg/h via INTRAVENOUS
  Administered 2023-07-14: 12 mg/h via INTRAVENOUS
  Administered 2023-07-14: 6 mg/h via INTRAVENOUS
  Administered 2023-07-14: 4 mg/h via INTRAVENOUS
  Filled 2023-07-13: qty 50
  Filled 2023-07-13 (×3): qty 100
  Filled 2023-07-13: qty 50
  Filled 2023-07-13 (×2): qty 100

## 2023-07-13 MED ORDER — OSMOLITE 1.5 CAL PO LIQD
1000.0000 mL | ORAL | Status: DC
  Administered 2023-07-13: 1000 mL

## 2023-07-13 MED ORDER — SODIUM CHLORIDE 0.9% IV SOLUTION: Freq: Once | INTRAVENOUS | Status: DC

## 2023-07-13 MED ORDER — ACETAMINOPHEN 160 MG/5ML PO SOLN
650.0000 mg | ORAL | Status: DC | PRN
  Administered 2023-07-13 – 2023-07-16 (×13): 650 mg
  Filled 2023-07-13 (×13): qty 20.3

## 2023-07-13 MED ORDER — STROKE: EARLY STAGES OF RECOVERY BOOK
Freq: Once | Status: AC
  Filled 2023-07-13: qty 1

## 2023-07-13 MED ORDER — ETOMIDATE 2 MG/ML IV SOLN
INTRAVENOUS | Status: AC | PRN
  Administered 2023-07-13: 10 mg via INTRAVENOUS

## 2023-07-13 MED ORDER — ACETAMINOPHEN 650 MG RE SUPP: 650.0000 mg | RECTAL | Status: DC | PRN

## 2023-07-13 MED ORDER — ACETAMINOPHEN 325 MG PO TABS: 650.0000 mg | ORAL_TABLET | ORAL | Status: DC | PRN

## 2023-07-13 MED ORDER — IPRATROPIUM-ALBUTEROL 0.5-2.5 (3) MG/3ML IN SOLN
3.0000 mL | Freq: Two times a day (BID) | RESPIRATORY_TRACT | Status: DC
  Administered 2023-07-13 – 2023-07-16 (×5): 3 mL via RESPIRATORY_TRACT
  Filled 2023-07-13 (×5): qty 3

## 2023-07-13 MED ORDER — ONDANSETRON HCL 4 MG/2ML IJ SOLN
INTRAMUSCULAR | Status: AC
  Filled 2023-07-13: qty 2

## 2023-07-13 MED ORDER — CHLORHEXIDINE GLUCONATE CLOTH 2 % EX PADS
6.0000 | MEDICATED_PAD | Freq: Every day | CUTANEOUS | Status: DC
  Administered 2023-07-14 – 2023-07-15 (×3): 6 via TOPICAL

## 2023-07-13 MED ORDER — IPRATROPIUM-ALBUTEROL 0.5-2.5 (3) MG/3ML IN SOLN
3.0000 mL | Freq: Four times a day (QID) | RESPIRATORY_TRACT | Status: DC | PRN
Start: 2023-07-13 — End: 2023-07-16

## 2023-07-13 MED ORDER — POTASSIUM & SODIUM PHOSPHATES 280-160-250 MG PO PACK
2.0000 | PACK | ORAL | Status: AC
  Administered 2023-07-13 (×2): 2
  Filled 2023-07-13 (×2): qty 2

## 2023-07-13 MED ORDER — AMLODIPINE BESYLATE 10 MG PO TABS
10.0000 mg | ORAL_TABLET | Freq: Every day | ORAL | Status: DC
  Administered 2023-07-13 – 2023-07-16 (×4): 10 mg
  Filled 2023-07-13 (×4): qty 1

## 2023-07-13 MED ORDER — PANTOPRAZOLE SODIUM 40 MG IV SOLR
40.0000 mg | Freq: Every day | INTRAVENOUS | Status: DC
Start: 2023-07-13 — End: 2023-07-13

## 2023-07-13 MED ORDER — CLEVIDIPINE BUTYRATE 0.5 MG/ML IV EMUL
0.0000 mg/h | INTRAVENOUS | Status: DC
  Administered 2023-07-13: 2 mg/h via INTRAVENOUS
  Filled 2023-07-13: qty 50

## 2023-07-13 MED ORDER — SUCCINYLCHOLINE CHLORIDE 20 MG/ML IJ SOLN
INTRAMUSCULAR | Status: AC | PRN
  Administered 2023-07-13: 150 mg via INTRAVENOUS

## 2023-07-13 MED ORDER — LABETALOL HCL 5 MG/ML IV SOLN
10.0000 mg | INTRAVENOUS | Status: DC | PRN
  Administered 2023-07-13: 10 mg via INTRAVENOUS
  Filled 2023-07-13: qty 4

## 2023-07-13 MED ORDER — PROPOFOL 1000 MG/100ML IV EMUL
5.0000 ug/kg/min | INTRAVENOUS | Status: DC
  Administered 2023-07-13 – 2023-07-14 (×3): 40 ug/kg/min via INTRAVENOUS
  Administered 2023-07-14 (×2): 30 ug/kg/min via INTRAVENOUS
  Administered 2023-07-14: 30.025 ug/kg/min via INTRAVENOUS
  Administered 2023-07-15: 20 ug/kg/min via INTRAVENOUS
  Administered 2023-07-15: 30 ug/kg/min via INTRAVENOUS
  Administered 2023-07-16: 25 ug/kg/min via INTRAVENOUS
  Administered 2023-07-16: 30 ug/kg/min via INTRAVENOUS
  Filled 2023-07-13 (×10): qty 100
  Filled 2023-07-13: qty 200

## 2023-07-13 MED ORDER — INSULIN ASPART 100 UNIT/ML IJ SOLN
0.0000 [IU] | INTRAMUSCULAR | Status: DC
  Administered 2023-07-13 – 2023-07-14 (×8): 1 [IU] via SUBCUTANEOUS
  Administered 2023-07-15: 2 [IU] via SUBCUTANEOUS
  Administered 2023-07-15: 1 [IU] via SUBCUTANEOUS
  Administered 2023-07-15: 2 [IU] via SUBCUTANEOUS
  Administered 2023-07-16: 1 [IU] via SUBCUTANEOUS

## 2023-07-13 NOTE — Progress Notes (Signed)
Pt transported from ED25 to CT and back with no complications

## 2023-07-13 NOTE — ED Notes (Signed)
Report given to staff in ICU

## 2023-07-13 NOTE — TOC Initial Note (Signed)
Transition of Care [facility]) - Initial/Assessment Note    Patient Details  Name: Stephanie Alvarado MRN: [id number] Date of Birth: 03-03-1941  Transition of Care [facility]) CM/SW Contact:    Lamonte Sakai, Student-Social Work Phone Number: 07/13/2023, 4:26 PM  Clinical Narrative:                 Pt admitted from home with significant other. Pt currently intubated, TOC unable to assess needs at this time. Please consult as needs arise.        Patient Goals and CMS Choice            Expected Discharge Plan and Services       Living arrangements for the past 2 months: Single Family Home                                      Prior Living Arrangements/Services Living arrangements for the past 2 months: Single Family Home Lives with:: Significant Other                   Activities of Daily Living      Permission Sought/Granted                  Emotional Assessment   Attitude/Demeanor/Rapport: Intubated (Following Commands or Not Following Commands) Affect (typically observed): Unable to Assess Orientation: :  (Intubated)      Admission diagnosis:  Hemorrhagic stroke (HCC) [I61.9] ICH (intracerebral hemorrhage) (HCC) [I61.9] Hypertensive emergency [I16.1] Patient Active Problem List   Diagnosis Date Noted   ICH (intracerebral hemorrhage) (HCC) 07/13/2023   Acute respiratory failure with hypoxia (HCC) 05/28/2023   Neuropathy 05/28/2023   Right upper lobe pneumonia 05/28/2023   Hyperlipidemia 05/16/2022   Nocturnal hypoxemia 11/11/2021   Opacity of lung on imaging study 07/13/2021   Acute saddle pulmonary embolism (HCC) 07/12/2021   Acute on chronic respiratory failure with hypoxia (HCC) 07/12/2021   Chest pain 07/11/2021   Daytime sleepiness 07/10/2021   AKI (acute kidney injury) (HCC) 07/08/2021   Chronic diastolic CHF (congestive heart failure) (HCC) 07/07/2021   Thrombocytopenia (HCC) 07/07/2021   Chronic respiratory failure with hypoxia, on home  oxygen therapy (HCC) 07/07/2021   Hypokalemia 07/07/2021   Primary osteoarthritis of left knee 12/23/2019   Diarrhea 09/06/2017   Chronic RLQ pain 05/26/2017   Lightheadedness    Ataxia 05/30/2016   Dizziness 05/30/2016   Preoperative clearance 11/10/2015   Varicose veins of left lower extremity with complications 05/11/2015   Varicose veins of leg with complications 10/21/2014   Lumbar scoliosis 11/21/2013   Preop cardiovascular exam 10/29/2013   LLQ abdominal pain 04/05/2013   Diverticulitis 04/05/2013   Depression 03/19/2012   Palpitations    Subclavian artery disease (HCC)    PFO (patent foramen ovale)    CAD (coronary artery disease)    Ejection fraction    Atherosclerosis of aorta (HCC)    Coronary artery disease involving native coronary artery of native heart without angina pectoris    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   Chronic constipation 06/11/2010   ABDOMINAL PAIN-LLQ 06/11/2010   History of adenomatous polyp of colon 06/11/2010   OSA (obstructive sleep apnea) 07/17/2009   Stroke (HCC) 11/11/2008   COLONIC POLYPS 08/31/2008   Thyroid nodule 08/31/2008   VENOUS INSUFFICIENCY 08/31/2008   Diverticulosis of colon 08/31/2008   LOW BACK PAIN, CHRONIC 08/31/2008   FIBROMYALGIA 08/31/2008  Dyspnea 08/08/2008   HYPERCHOLESTEROLEMIA 04/19/2007   OBESITY 04/19/2007   ANEMIA 04/19/2007   ANXIETY 04/19/2007   Essential hypertension 04/19/2007   ALLERGIC RHINITIS 04/19/2007   PULMONARY NODULE, RIGHT LOWER LOBE 04/19/2007   GERD 04/19/2007   OA (osteoarthritis) of knee 04/19/2007   PCP:  [name], MD Pharmacy:   [facility] [address] [place] [address] Phone: [phone] Fax: [phone]     Social Drivers of Health (SDOH) Social History: SDOH Screenings   Food Insecurity: No Food Insecurity (05/28/2023)  Housing: Low Risk  (05/28/2023)  Transportation Needs: No Transportation Needs (05/28/2023)   Utilities: Not At Risk (05/28/2023)  Social Connections: Unknown (10/27/2021)   Received [facility]  Tobacco Use: Medium Risk (05/28/2023)   SDOH Interventions:     Readmission Risk Interventions     No data to display

## 2023-07-13 NOTE — Progress Notes (Signed)
SLP Cancellation Note  Patient Details Name: Stephanie Alvarado MRN: [id number] DOB: 05/24/1941   Cancelled treatment:       Reason Eval/Treat Not Completed: Medical issues which prohibited therapy (Patient intubated and out of room at this time. Will f/u.)  Ferdinand Lango MA, CCC-SLP  [name] 07/13/2023, 10:48 AM

## 2023-07-13 NOTE — H&P (Signed)
NEUROLOGY H&P NOTE   Date of service: July 13, 2023 Patient Name: Stephanie Alvarado MRN:  [id number] DOB:  [birth year] Chief Complaint: "R sided weakness, aphasia, lethargic, vomiting"  History of Present Illness  Stephanie Alvarado is a 83 y.o. female with hx of anemia, CAD, dCHF, DVT, PE not on AC, HTN, HLD, OSA on CPAP, thrombocytopenia who has been reporting headache and vomiting in the evening around 1830 on 07/12/23. She did not have any focal deficit when she went to bed aroun 2300. She woke up around 3AM, headache, vomiting, developed R sided weakness and then stopped talking. Significant other tried for 30 mins to get her up and eventually called 911.  EMS noted R sided weakness and aphasia and brought her in as a code stroke. Initial SBP for EMS was 160, here her Blood Pressure is 220 systolic. CT head with large left frontal lobe IPH with mass effect, edema, midline shift of about 14mm. Had to be intubated prior to getting CTA head and neck.  She is not on [facility], she has thrombocytopenia with CBC from a month ago with platelet count of 86.  Last known well: 2300 Modified rankin score: 0-Completely asymptomatic and back to baseline post- stroke ICH Score: 3 tNKASE: Not offered due to ICH Thrombectomy: not offered due to ICH NIHSS components Score: Comment  1a Level of Conscious 0[x]  1[]  2[]  3[]      1b LOC Questions 0[]  1[]  2[x]       1c LOC Commands 0[]  1[]  2[x]       2 Best Gaze 0[]  1[]  2[x]       3 Visual 0[]  1[]  2[x]  3[]      4 Facial Palsy 0[x]  1[]  2[]  3[]      5a Motor Arm - left 0[]  1[]  2[x]  3[]  4[]  UN[]    5b Motor Arm - Right 0[]  1[]  2[]  3[]  4[x]  UN[]    6a Motor Leg - Left 0[]  1[]  2[]  3[x]  4[]  UN[]    6b Motor Leg - Right 0[]  1[]  2[]  3[x]  4[]  UN[]    7 Limb Ataxia 0[x]  1[]  2[]  3[]  UN[]     8 Sensory 0[]  1[]  2[x]  UN[]      9 Best Language 0[]  1[]  2[]  3[x]      10 Dysarthria 0[]  1[]  2[x]  UN[]      11 Extinct. and Inattention 0[x]  1[]  2[]       TOTAL: 27      ROS  Unable to perform due  to aphasia.  Past History   Past Medical History:  Diagnosis Date   Achilles tendon rupture, right, initial encounter    Allergic rhinitis, cause unspecified    Anemia, unspecified    Anxiety state, unspecified    Atherosclerosis of aorta (HCC)    TEE, June, 2010, grade 4 aortic arch atherosclerosis , Dr. Shirlee Latch   CAD (coronary artery disease)    DES and circumflex 2006 /  DES to LAD 2011   Carotid artery disease (HCC)    Doppler, November, 2011, stable, 0-39% bilateral, turbulent flow left subclavian   Chronic diastolic heart failure (HCC)    Cyst of thyroid    Diverticulosis of colon (without mention of hemorrhage)    DVT (deep venous thrombosis) (HCC)    in leg   Dysphasia    Possibly esophageal stricture   Esophageal reflux    Hiatal hernia    Hyperlipemia    Hypertension    Lumbago    Lumbar disc disease    MVP (mitral valve prolapse)    Neuropathy  Nonspecific abnormal finding in stool contents    Obesity, unspecified    Osteoarthrosis, unspecified whether generalized or localized, unspecified site    Osteopenia    Other diseases of lung, not elsewhere classified    PE (pulmonary thromboembolism) (HCC)    both lungs   Peripheral vascular disease, unspecified (HCC)    Personal history of colonic polyps    ADENOMATOUS POLYP   PFO (patent foramen ovale)    Patient had a very small PFO by TEE 2010.  This was seen only by bubble analysis during Valsalva   PONV (postoperative nausea and vomiting)    Sleep apnea    uses CPAP nightly   Sleep related hypoventilation/hypoxemia in conditions classifiable elsewhere    Stroke [facility]) 11/2008   Treated with TPA? /     2010, TEE no left atrial clot, probably from atherosclerosis of the aortic arch   Subclavian artery disease (HCC)    Remote surgery by Dr. Betti Cruz.   Unspecified cerebral artery occlusion with cerebral infarction    Unspecified venous (peripheral) insufficiency    Vitamin B 12 deficiency    Past Surgical  History:  Procedure Laterality Date   ACHILLES TENDON SURGERY Right 04/05/2018   Procedure: Right achilles tendon reconstruction;  Surgeon: Toni Arthurs, MD;  Location: [facility];  Service: Orthopedics;  Laterality: Right;    BACK SURGERY     cataracts     both eyes   CHOLECYSTECTOMY     CORONARY ANGIOPLASTY WITH STENT PLACEMENT  1990's   GASTROCNEMIUS RECESSION Right 04/05/2018   Procedure: Gastroc recession;  Surgeon: Toni Arthurs, MD;  Location: [facility];  Service: Orthopedics;  Laterality: Right;   LAPAROSCOPIC HYSTERECTOMY     LUMBAR DISC SURGERY     X 3   RIGHT/LEFT HEART CATH AND CORONARY ANGIOGRAPHY N/A 04/10/2017   Procedure: RIGHT/LEFT HEART CATH AND CORONARY ANGIOGRAPHY;  Surgeon: Lyn Records, MD;  Location: [facility];  Service: Cardiovascular;  Laterality: N/A;   TOTAL KNEE ARTHROPLASTY Right 01/14/2019   Procedure: TOTAL KNEE ARTHROPLASTY;  Surgeon: Ollen Gross, MD;  Location: [facility];  Service: Orthopedics;  Laterality: Right;    TOTAL KNEE ARTHROPLASTY Left 12/23/2019   Procedure: TOTAL KNEE ARTHROPLASTY;  Surgeon: Ollen Gross, MD;  Location: [facility];  Service: Orthopedics;  Laterality: Left;    urethral suspension  1993   Dr. Jennette Kettle   VARICOSE VEIN SURGERY     x 2   Family History  Problem Relation Age of Onset   Heart disease Father    Heart attack Father    Alzheimer's disease Mother    Colon cancer Paternal Aunt    Breast cancer Other        cousions   Esophageal cancer Neg Hx    Rectal cancer Neg Hx    Stomach cancer Neg Hx    Social History   Socioeconomic History   Marital status: Widowed    Spouse name: Not on file   Number of children: 4   Years of education: Not on file   Highest education level: Not on file  Occupational History   Occupation: Retired    Associate Professor: RETIRED  Tobacco Use   Smoking status: Former    Current packs/day: 0.00    Average packs/day: 1 pack/day for 40.0  years (40.0 ttl pk-yrs)    Types: Cigarettes    Start date: 06/13/1958    Quit date: 06/13/1998    Years since  quitting: 25.0   Smokeless tobacco: Never  Vaping Use   Vaping status: Never Used  Substance and Sexual Activity   Alcohol use: No    Alcohol/week: 0.0 standard drinks of alcohol   Drug use: No   Sexual activity: Not Currently    Birth control/protection: Post-menopausal  Other Topics Concern   Not on file  Social History Narrative   Not on file   Social Drivers of Health   Financial Resource Strain: Not on file  Food Insecurity: No Food Insecurity (05/28/2023)   Hunger Vital Sign    Worried About Running Out of Food in the Last Year: Never true    Ran Out of Food in the Last Year: Never true  Transportation Needs: No Transportation Needs (05/28/2023)   PRAPARE - Administrator, Civil Service (Medical): No    Lack of Transportation (Non-Medical): No  Physical Activity: Not on file  Stress: Not on file  Social Connections: Unknown (10/27/2021)   Received from [facility]   Social Network    Social Network: Not on file   Allergies  Allergen Reactions   Codeine Itching    Medications  (Not in a hospital admission)    Vitals   Vitals:   07/13/23 0345  BP: (!) 211/79  Pulse: 69  SpO2: 100%     There is no height or weight on file to calculate BMI.  Physical Exam   General: Laying comfortably in bed; in no acute distress.  HENT: Normal oropharynx and mucosa. Normal external appearance of ears and nose.  Neck: Supple, no pain or tenderness  CV: No JVD. No peripheral edema.  Pulmonary: Symmetric Chest rise. Normal respiratory effort.  Abdomen: Soft to touch, non-tender.  Ext: No cyanosis, edema, or deformity  Skin: No rash. Normal palpation of skin.   Musculoskeletal: Normal digits and nails by inspection. No clubbing.   Neurologic Examination  Mental status/Cognition: Awake, eyes open, makes eye contact on the left but not  on the right. Mute, does not answer orientation questions. Speech/language: mute, no speech, does not follow commands. Cranial nerves:   CN II Pupils equal and reactive to light, does not blink to threat on the right.   CN III,IV,VI L gaze deviation, does not cross midline   CN V normal sensation in V1, V2, and V3 segments bilaterally   CN VII no asymmetry, no nasolabial fold flattening   CN VIII Makes eye contact to speech on the left   CN IX & X ?worsening mentation and concern for airway   CN XI Head is midline   CN XII midline tongue, does not protrude on command.   Motor:  Muscle bulk: normal, tone flaccid in RUE LUE drifts and hits the bed in a couple secs when held up off the bed. No movement in RUE Some movement in BL lower extremities but no antigravity movements.  Sensation:  Light touch    Pin prick No response to pinch in RUE and RLE.   Temperature    Vibration   Proprioception    Coordination/Complex Motor:  Unable to assess.  Labs   CBC:  Recent Labs  Lab 07/13/23 0335  HGB 11.6*  HCT 34.0*    Basic Metabolic Panel:  Lab Results  Component Value Date   NA 138 07/13/2023   K 3.7 07/13/2023   CO2 30 06/01/2023   GLUCOSE 155 (H) 07/13/2023   BUN 18 07/13/2023   CREATININE 1.10 (H) 07/13/2023  CALCIUM 9.2 06/01/2023   GFRNONAA >60 06/01/2023   GFRAA >60 12/25/2019   Lipid Panel:  Lab Results  Component Value Date   LDLCALC 58 05/31/2016   HgbA1c:  Lab Results  Component Value Date   HGBA1C 5.8 (H) 05/31/2016   Urine Drug Screen: No results found for: "LABOPIA", "COCAINSCRNUR", "LABBENZ", "AMPHETMU", "THCU", "LABBARB"  Alcohol Level No results found for: "ETH" INR  Lab Results  Component Value Date   INR 1.3 (H) 07/12/2021   APTT  Lab Results  Component Value Date   APTT 98 (H) 07/12/2021     CT Head without contrast(Personally reviewed): Large L frontal ICH with 1.4cm midline shift  CT angio Head and Neck with  contrast: Pending  MRI Brain: pending   Impression   Stephanie Alvarado is a 83 y.o. female with hx of anemia, CAD, dCHF, DVT, PE not on AC, HTN, HLD, OSA on CPAP, thrombocytopenia who has been reporting headache and vomiting in the evening. Went to bed around 2300 and woke up with worsening headache, vomiting, developed R sided weakness and then aphasia.  CT head with large left frontal ICH.  Primary Diagnosis:  Nontraumatic intracerebral hemorrhage in hemisphere, cortical  Secondary Diagnosis: Brain compression, Cerebral edema, and Hypertension Emergency (SBP > 180 or DBP > 120 & end organ damage)  Recommendations  L frontal ICH with ICH score of 3 with mass effect, brain compression, midline shift of the brain: - Admit to ICU - Stability scan in 6 hours or STAT with any neurological decline - Frequent neuro checks; q10min for 1 hour, then q1hour - No antiplatelets or anticoagulants due to ICH - SCD for DVT prophylaxis, pharmacological DVT ppx at 24 hours if ICH is stable - Blood pressure control with goal systolic 130 - 150, cleverplex and labetalol PRN - Stroke labs, HgbA1c, fasting lipid panel - MRI brain with and without contrast when stabilized to evaluate for underlying mass - MRA without contrast of the brain and Vasc US carotid duplex to evaluate for underlying vascular abnormality. - Risk factor modification - Echocardiogram - PT consult, OT consult, Speech consult. - Stroke team to follow - hypertonic saline 250cc bolus, followed by 75cc/hr with goal Sodium of 150-155. - Platelet transfusion to keep platelet count above 100k.   ______________________________________________________________________  This patient is critically ill and at significant risk of neurological worsening, death and care requires constant monitoring of vital signs, hemodynamics,respiratory and cardiac monitoring, neurological assessment, discussion with family, other specialists and medical  decision making of high complexity. I spent 50 minutes of neurocritical care time  in the care of  this patient. This was time spent independent of any time provided by nurse practitioner or PA.  Erick Blinks [facility] 07/13/2023  5:14 AM  Signed,  Erick Blinks, MD [facility]

## 2023-07-13 NOTE — ED Notes (Signed)
Chest xray completed.

## 2023-07-13 NOTE — Progress Notes (Signed)
Initial Nutrition Assessment  DOCUMENTATION CODES:   Not applicable  INTERVENTION:   Initiate tube feeding via OG tube: Osmolite 1.5 at 45 ml/h (1080 ml per day)  Prosource TF20 60 ml BID  Provides 1780 kcal, 107 gm protein, 820 ml free water daily  MVI with minerals daily via tube  100 mg thiamine daily x 7 days via tube  Monitor magnesium and phosphorus every 12 hours x 4 occurrences, MD to replete as needed, as pt is at risk for refeeding syndrome given potential weight loss PTA.   NUTRITION DIAGNOSIS:   Inadequate oral intake related to inability to eat as evidenced by NPO status.  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   Pt discussed during ICU rounds and with RN. Pt intubated, seen by neurosurgery with no plans for surgery at this time.  Per chart review pt was 208 lb in 2023, down to 194 lb 06/2022 and 184 lb 05/2023 which would be a 11.3% weight loss x 12 months and 6.5% weight loss x 1 month.  Pt currently down in MRI.   1/30 - admitted, intubated, TF initiated  Medications reviewed and include: famotidine, SSI, 3 units novolog every 4 hours, senokot-s Cleviprex @ 10 ml/hr provides 480 kcal  Propofol @ 24 ml/hr provides 633 kcal  Hypertonic saline @ 75 ml/hr  Labs reviewed:  K 3.4 A1C 5.5 CBG's: 138 - 171  16 F OG tube; tip gastric per xray   NUTRITION - FOCUSED PHYSICAL EXAM:  In MRI  Diet Order:   Diet Order             Diet NPO time specified  Diet effective now                   EDUCATION NEEDS:   Not appropriate for education at this time  Skin:  Skin Assessment: Reviewed RN Assessment  Last BM:   unknown  Height:   Ht Readings from Last 1 Encounters:  07/13/23 5\' 8"  (1.727 m)    Weight:   Wt Readings from Last 1 Encounters:  07/13/23 78.3 kg   BMI:  Body mass index is 26.25 kg/m.  Estimated Nutritional Needs:   Kcal:  1700-1900  Protein:  95-110 grams  Fluid:  >1.7 L/day  Cammy Copa., RD, LDN, CNSC See AMiON for contact information

## 2023-07-13 NOTE — Code Documentation (Signed)
Stroke Response Nurse Documentation Code Documentation  HAYA HEMLER is a 83 y.o. female arriving to [facility]  via [facility] on 07/13/2023  with past medical hx of anemia, CAD, dCHF, DVT, PE not on AC, HTN, HLD, OSA on CPAP, and thrombocytopenia . On No antithrombotic. Code stroke was activated by EMS.   Patient from home where she was LKW at 2230 and now complaining of right sided weakness, headache, AMS and vomiting.   Stroke team at the bedside on patient arrival. Labs drawn and patient cleared for CT by Dr. Wilkie Aye. Patient to CT with team. NIHSS 27, see documentation for details and code stroke times. Patient with disoriented, not following commands, left gaze preference , right hemianopia, right arm weakness, right leg weakness, right decreased sensation, Global aphasia , and dysarthria  on exam. The following imaging was completed:  CT Head. Patient is not a candidate for IV Thrombolytic due to large left frontal lobe IPH with mass effect, edema, midline shift of about 8mm.   Care Plan: Neuro surgery consult and ICU admission.   Bedside handoff with ED RN Irven Baltimore.    Nechama Guard  Stroke Response RN

## 2023-07-13 NOTE — Progress Notes (Signed)
Pt transported on vent to MRI and back without complications.

## 2023-07-13 NOTE — Progress Notes (Signed)
Dr. Derry Lory updated daughter at bedside.

## 2023-07-13 NOTE — Progress Notes (Signed)
Pt. Transported from Rm25 in the ED to RM 4N25. On 100% O2 without any complications. VS stable at this time

## 2023-07-13 NOTE — Progress Notes (Signed)
Patient dumped 1L clear urine output via Foley at this time. Total UO in 6 hours almost 2L. Urine specific gravity done 1.014. CCM and Neuro MD aware. No further orders.    1800  Approximately 3L urine output total this shift. Dr. Pearlean Brownie aware. No further orders.

## 2023-07-13 NOTE — ED Notes (Addendum)
Pt returned from scanner; pt exhibiting purposeful movement.

## 2023-07-13 NOTE — ED Provider Notes (Addendum)
[place] EMERGENCY DEPARTMENT AT [facility] Provider Note   CSN: [id number] Arrival date & time: 07/13/23  6213  An emergency department physician performed an initial assessment on this suspected stroke patient at 0329.  History  Chief Complaint  Patient presents with  . Code Stroke    Stephanie Alvarado is a 83 y.o. female.  HPI     This is an 83 year old female who presents as a code stroke by EMS.  Last seen normal around 11 PM by her fianc at home.  Was complaining at that time of a headache.  Per EMS she fell out of bed and he was unable to get her up.  They noted decreased movement on the right side.  She also had episodes of nausea and vomiting.  She has been fairly unresponsive to them and has not been verbal.  Level 5 caveat Home Medications Prior to Admission medications   Medication Sig Start Date End Date Taking? Authorizing Provider  albuterol (VENTOLIN HFA) 108 (90 Base) MCG/ACT inhaler Inhale 2 puffs into the lungs every 6 (six) hours as needed for wheezing or shortness of breath. 06/01/23   Rai, Ripudeep Kirtland Bouchard, MD  ALLERGY RELIEF CETIRIZINE 10 MG tablet Take 10 mg by mouth at bedtime. 02/14/22   [provider]  ARIPiprazole (ABILIFY) 2 MG tablet Take 1 tablet (2 mg total) by mouth every evening. 04/07/23     aspirin EC 81 MG tablet Take 1 tablet (81 mg total) by mouth daily. Swallow whole. 03/13/23   Tonny Bollman, MD  benzonatate (TESSALON) 200 MG capsule Take 1 capsule (200 mg total) by mouth 3 (three) times daily as needed for cough. 06/01/23   Rai, Delene Ruffini, MD  dicyclomine (BENTYL) 20 MG tablet Take 1 tablet (20 mg total) by mouth 3 (three) times daily as needed for muscle spasms. Patient taking differently: Take 20 mg by mouth 3 (three) times daily as needed for spasms. 10/28/22   Doree Albee, PA-C  furosemide (LASIX) 40 MG tablet Take 1 tablet (40 mg total) by mouth daily. 04/04/23   Tonny Bollman, MD  gabapentin (NEURONTIN) 300 MG  capsule Take 600 mg by mouth 3 (three) times daily.  10/31/15   [provider]  hyoscyamine (LEVSIN) 0.125 MG tablet 1-2 tabs every 4-6 hours prn 09/19/22   Hilarie Fredrickson, MD  isosorbide mononitrate (IMDUR) 30 MG 24 hr tablet Take 0.5 tablets (15 mg total) by mouth at bedtime. 02/09/23     latanoprost (XALATAN) 0.005 % ophthalmic solution Place 1 drop into both eyes at bedtime. 08/04/21   [provider]  metoprolol tartrate (LOPRESSOR) 50 MG tablet Take 1 tablet (50 mg total) by mouth 2 (two) times daily. 05/08/23   Tonny Bollman, MD  mirabegron ER (MYRBETRIQ) 25 MG TB24 tablet Take 1 tablet (25 mg total) by mouth daily. 06/29/23     nitroGLYCERIN (NITROSTAT) 0.4 MG SL tablet Place 0.4 mg under the tongue every 5 (five) minutes as needed for chest pain (TAKE UP TO 3 DOSES BEFORE CALLING 911).    [provider]  nystatin cream (MYCOSTATIN) Apply 1 Application topically 2 (two) times daily. Patient taking differently: Apply 1 Application topically 2 (two) times daily as needed (for itching under the breasts). 01/18/22   Doree Albee, PA-C  OXYGEN Inhale 2 L/min into the lungs as needed (for shortness of breath).    [provider]  pantoprazole (PROTONIX) 40 MG tablet Take 40 mg by mouth 2 (two)  times daily before a meal.    [provider]  Polyethylene Glycol 3350 (PEG 3350) 17 GM/SCOOP POWD Mix 17 g (1 capful) in 8oz of liquid and drink by mouth daily for constipation. 11/11/22     potassium chloride (KLOR-CON) 10 MEQ tablet Take 1 tablet (10 mEq total) by mouth in the morning. 06/01/23   Tonny Bollman, MD  PRESCRIPTION MEDICATION See admin instructions. CPAP- At bedtime    [provider]  rosuvastatin (CRESTOR) 10 MG tablet Take 1 tablet (10 mg total) by mouth at bedtime. 02/09/23     tiZANidine (ZANAFLEX) 4 MG tablet Take 4 mg by mouth daily as needed for muscle spasms. 08/09/21   [provider]  TYLENOL 500 MG tablet Take 500-1,500  mg by mouth 2 (two) times daily as needed for mild pain or headache.    [provider]  metoprolol tartrate (LOPRESSOR) 25 MG tablet Take 1 tablet (25 mg total) by mouth 2 (two) times daily. Patient taking differently: Take by mouth 2 (two) times daily. 04/04/23   Tonny Bollman, MD      Allergies    Codeine    Review of Systems   Review of Systems  Unable to perform ROS: Mental status change    Physical Exam Updated Vital Signs BP (!) 137/59   Pulse 63   Temp (!) 95.6 F (35.3 C)   Resp (!) 23   Ht 1.727 m (5\' 8" )   Wt 81.6 kg   SpO2 100%   BMI 27.37 kg/m  Physical Exam Vitals and nursing note reviewed.  Constitutional:      Appearance: She is well-developed. She is ill-appearing.  HENT:     Head: Normocephalic and atraumatic.     Nose: Nose normal.     Mouth/Throat:     Comments: Dried vomit noted Eyes:     Pupils: Pupils are equal, round, and reactive to light.     Comments: Pupils 6 mm and reactive, right sided neglect noted, with gaze preference to the left  Cardiovascular:     Rate and Rhythm: Normal rate and regular rhythm.     Heart sounds: Normal heart sounds.  Pulmonary:     Effort: Pulmonary effort is normal. No respiratory distress.     Breath sounds: No wheezing.  Abdominal:     Palpations: Abdomen is soft.  Musculoskeletal:     Cervical back: Neck supple.  Skin:    General: Skin is warm and dry.  Neurological:     Mental Status: She is alert.     Comments: Unable to assess orientation, does not follow commands but does have spontaneous movement with the left upper extremity, right upper extremity flaccid, right sided neglect with left gaze preference    ED Results / Procedures / Treatments   Labs (all labs ordered are listed, but only abnormal results are displayed) Labs Reviewed  CBC - Abnormal; Notable for the following components:      Result Value   WBC 2.0 (*)    Hemoglobin 11.3 (*)    RDW 19.6 (*)    Platelets 75 (*)     All other components within normal limits  DIFFERENTIAL - Abnormal; Notable for the following components:   Neutro Abs 0.7 (*)    Abs Immature Granulocytes 0.09 (*)    All other components within normal limits  COMPREHENSIVE METABOLIC PANEL - Abnormal; Notable for the following components:   Glucose, Bld 160 (*)    Creatinine, Ser 1.13 (*)  GFR, Estimated 48 (*)    All other components within normal limits  I-STAT CHEM 8, ED - Abnormal; Notable for the following components:   Creatinine, Ser 1.10 (*)    Glucose, Bld 155 (*)    Hemoglobin 11.6 (*)    HCT 34.0 (*)    All other components within normal limits  CBG MONITORING, ED - Abnormal; Notable for the following components:   Glucose-Capillary 171 (*)    All other components within normal limits  I-STAT ARTERIAL BLOOD GAS, ED - Abnormal; Notable for the following components:   pO2, Arterial 375 (*)    Acid-Base Excess 3.0 (*)    Potassium 3.4 (*)    HCT 31.0 (*)    Hemoglobin 10.5 (*)    All other components within normal limits  PROTIME-INR  APTT  ETHANOL  SODIUM  SODIUM  SODIUM  PREPARE PLATELET PHERESIS  TYPE AND SCREEN    EKG None  Radiology CT HEAD CODE STROKE WO CONTRAST Result Date: 07/13/2023 CLINICAL DATA:  Code stroke.  Neuro deficit, acute, stroke suspected EXAM: CT HEAD WITHOUT CONTRAST TECHNIQUE: Contiguous axial images were obtained from the base of the skull through the vertex without intravenous contrast. RADIATION DOSE REDUCTION: This exam was performed according to the departmental dose-optimization program which includes automated exposure control, adjustment of the mA and/or kV according to patient size and/or use of iterative reconstruction technique. COMPARISON:  None Available. FINDINGS: Brain: Large multifocal acute intraparenchymal hemorrhages in the left frontal lobe, measuring up to approximately 6.1 x 5.8 x 5.0 cm. Surrounding edema and mass effect with approximately 1.4 cm of rightward midline  shift. There is loss of gray-white differentiation surrounding the hemorrhage. No hydrocephalus. Moderate to advanced patchy white matter hypodensities, nonspecific but compatible with chronic microvascular ischemic disease. Vascular: No hyperdense vessel identified. Skull: No acute fracture. Sinuses/Orbits: Clear sinuses.  No acute orbital findings. Other: No mastoid effusions. IMPRESSION: 1. Large (6.1 cm) multifocal/heterogeneous intraparenchymal hemorrhage in the left frontal lobe with 1.4 cm of rightward midline shift. Given loss of grey-white differentiation surrounding the hemorrhage, hemorrhagic conversion of an infarct is a consideration. Follow-up MRI could further evaluate. 2. Small volume of overlying extra-axial extension. Code stroke imaging results were communicated on 07/13/2023 at 3:53 AM to provider Dr. Derry Lory Via telephone, who verbally acknowledged these results. Electronically Signed   By: Feliberto Harts M.D.   On: 07/13/2023 04:01    Procedures .Critical Care  Performed by: Shon Baton, MD Authorized by: Shon Baton, MD   Critical care provider statement:    Critical care time (minutes):  60   Critical care was necessary to treat or prevent imminent or life-threatening deterioration of the following conditions:  CNS failure or compromise   Critical care was time spent personally by me on the following activities:  Development of treatment plan with patient or surrogate, discussions with consultants, evaluation of patient's response to treatment, examination of patient, ordering and review of laboratory studies, ordering and review of radiographic studies, ordering and performing treatments and interventions, pulse oximetry, re-evaluation of patient's condition and review of old charts Procedure Name: Intubation Date/Time: 07/13/2023 3:56 AM  Performed by: Shon Baton, MDPre-anesthesia Checklist: Patient identified, Patient being monitored, Emergency  Drugs available, Timeout performed and Suction available Oxygen Delivery Method: Non-rebreather mask Preoxygenation: Pre-oxygenation with 100% oxygen Induction Type: Rapid sequence Ventilation: Mask ventilation without difficulty Laryngoscope Size: Glidescope and 3 Grade View: Grade I Tube size: 7.5 mm Number of attempts: 1 Placement Confirmation:  ETT inserted through vocal cords under direct vision, CO2 detector and Breath sounds checked- equal and bilateral Secured at: 23 cm Tube secured with: ETT holder Dental Injury: Teeth and Oropharynx as per pre-operative assessment        Medications Ordered in ED Medications  sodium chloride flush (NS) 0.9 % injection 3 mL (has no administration in time range)  etomidate (AMIDATE) injection (10 mg Intravenous Given 07/13/23 0346)  succinylcholine (ANECTINE) injection (150 mg Intravenous Given 07/13/23 0347)  clevidipine (CLEVIPREX) infusion 0.5 mg/mL (2.5 mg/hr Intravenous Rate/Dose Change 07/13/23 0421)  propofol (DIPRIVAN) 1000 MG/100ML infusion (20 mcg/kg/min  81.6 kg Intravenous Rate/Dose Change 07/13/23 0421)  0.9 %  sodium chloride infusion (Manually program via Guardrails IV Fluids) (has no administration in time range)  sodium chloride 3% (hypertonic) IV bolus 250 mL (250 mLs Intravenous New Bag/Given 07/13/23 0446)  sodium chloride (hypertonic) 3 % solution (has no administration in time range)  ondansetron (ZOFRAN) injection 4 mg (4 mg Intravenous Given 07/13/23 0330)    ED Course/ Medical Decision Making/ A&P Clinical Course as of 07/13/23 0449  Thu Jul 13, 2023  0440 Spoke with Dr. Franky Macho.  He will review imaging and evaluate the patient for any potential surgical options.  Family was updated at the bedside. [CH]    Clinical Course User Index [CH] Nehemias Sauceda, Mayer Masker, MD                                 Medical Decision Making Amount and/or Complexity of Data Reviewed Labs: ordered. Radiology: ordered.  Risk Prescription  drug management. Decision regarding hospitalization.   This patient presents to the ED for concern of altered mental status, stroke, this involves an extensive number of treatment options, and is a complaint that carries with it a high risk of complications and morbidity.  I considered the following differential and admission for this acute, potentially life threatening condition.  The differential diagnosis includes ischemic stroke, hemorrhagic stroke, complex migraine, seizure  MDM:    This is an 83 year old female who presents with concerns for stroke.  She is nonverbal and does not follow commands.  She does spontaneously move her left upper extremity.  Has complained of headache per her report and noted to have vomiting.  Highly suspicious for intracranial bleed.  Patient was taken emergently to the CT scanner.  She did have some hypoxia and was placed on a nonrebreather.  We were able to get CT scan; however, high concern for aspiration given vomiting.  Patient's son is her power of attorney.  Per neurology discussion with him, patient is currently full code.  Patient intubated for airway control.  Significantly hypertensive.  Patient given labetalol, Precedex.  She is was sedated with propofol.  CT scan shows large left frontal bleed.  Neurology is evaluating the patient.  (Labs, imaging, consults)  Labs: I Ordered, and personally interpreted labs.  The pertinent results include: Stroke panel  Imaging Studies ordered: I ordered imaging studies including CT I independently visualized and interpreted imaging. I agree with the radiologist interpretation  Additional history obtained from chart review.  External records from outside source obtained and reviewed including prior evaluations  Cardiac Monitoring: .The patient was maintained on a cardiac monitor.  If on the cardiac monitor, I personally viewed and interpreted the cardiac monitored which showed an underlying rhythm of: Sinus  rhythm  Reevaluation: After the interventions noted above, I reevaluated the patient and  found that they have : guarded prognosis  Social Determinants of Health: .lives with fiance  Disposition:  admit  Co morbidities that complicate the patient evaluation . Past Medical History:  Diagnosis Date  . Achilles tendon rupture, right, initial encounter   . Allergic rhinitis, cause unspecified   . Anemia, unspecified   . Anxiety state, unspecified   . Atherosclerosis of aorta (HCC)    TEE, June, 2010, grade 4 aortic arch atherosclerosis , Dr. Shirlee Latch  . CAD (coronary artery disease)    DES and circumflex 2006 /  DES to LAD 2011  . Carotid artery disease (HCC)    Doppler, November, 2011, stable, 0-39% bilateral, turbulent flow left subclavian  . Chronic diastolic heart failure (HCC)   . Cyst of thyroid   . Diverticulosis of colon (without mention of hemorrhage)   . DVT (deep venous thrombosis) (HCC)    in leg  . Dysphasia    Possibly esophageal stricture  . Esophageal reflux   . Hiatal hernia   . Hyperlipemia   . Hypertension   . Lumbago   . Lumbar disc disease   . MVP (mitral valve prolapse)   . Neuropathy   . Nonspecific abnormal finding in stool contents   . Obesity, unspecified   . Osteoarthrosis, unspecified whether generalized or localized, unspecified site   . Osteopenia   . Other diseases of lung, not elsewhere classified   . PE (pulmonary thromboembolism) (HCC)    both lungs  . Peripheral vascular disease, unspecified (HCC)   . Personal history of colonic polyps    ADENOMATOUS POLYP  . PFO (patent foramen ovale)    Patient had a very small PFO by TEE 2010.  This was seen only by bubble analysis during Valsalva  . PONV (postoperative nausea and vomiting)   . Sleep apnea    uses CPAP nightly  . Sleep related hypoventilation/hypoxemia in conditions classifiable elsewhere   . Stroke [facility]) 11/2008   Treated with TPA? /     2010, TEE no left atrial clot, probably  from atherosclerosis of the aortic arch  . 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 Betti Cruz.  Marland Kitchen Unspecified cerebral artery occlusion with cerebral infarction   . Unspecified venous (peripheral) insufficiency   . Vitamin B 12 deficiency      Medicines Meds ordered this encounter  Medications  . sodium chloride flush (NS) 0.9 % injection 3 mL  . ondansetron (ZOFRAN) injection 4 mg  . clevidipine (CLEVIPREX) 0.5 MG/ML infusion    Annice Needy, Veronda P: cabinet override  . etomidate (AMIDATE) injection  . succinylcholine (ANECTINE) injection  . propofol (DIPRIVAN) 1000 MG/100ML infusion    Annice Needy, Veronda P: cabinet override  . clevidipine (CLEVIPREX) infusion 0.5 mg/mL  . propofol (DIPRIVAN) 1000 MG/100ML infusion  . 0.9 %  sodium chloride infusion (Manually program via Guardrails IV Fluids)  . sodium chloride 3% (hypertonic) IV bolus 250 mL  . sodium chloride (hypertonic) 3 % solution    I have reviewed the patients home medicines and have made adjustments as needed  Problem List / ED Course: Problem List Items Addressed This Visit   None Visit Diagnoses       Hemorrhagic stroke (HCC)    -  Primary     Hypertensive emergency       Relevant Medications   clevidipine (CLEVIPREX) infusion 0.5 mg/mL                   Final  Clinical Impression(s) / ED Diagnoses Final diagnoses:  Hemorrhagic stroke [facility])  Hypertensive emergency    Rx / DC Orders ED Discharge Orders     None         Annis Lagoy, Mayer Masker, MD 07/13/23 1610    Shon Baton, MD 07/13/23 0400    Shon Baton, MD 07/13/23 [phone]

## 2023-07-13 NOTE — Consult Note (Signed)
NAME:  Stephanie Alvarado, MRN:  [id number], DOB:  1941-04-14, LOS: 0 ADMISSION DATE:  07/13/2023, CONSULTATION DATE:  07/13/2023 REFERRING MD:  Erick Blinks  CHIEF COMPLAINT:  ICH with respiratory failure   History of Present Illness:  Stephanie Alvarado is an 83 y.o. 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 According to chart review, patient reported a headache, nausea, and vomiting around 1830 1/29. She went to bed at 11 pm, awoke at 3 am with right sided weakness and expressive aphasia. EMS was called, upon arrival to ER SBP 220, NIHSS 27, Head CT large left frontal ICH w/ 14 mm MLS, CTA head/neck states no vascular lesion or occlusion seen underlying ICH, unclear if spot sign, incidental pulmonary nodule (see full report).  She was intubated for airway protection and admitted to the Neuro ICU for further management.   Pertinent  Medical History   Past Medical History:  Diagnosis Date   Achilles tendon rupture, right, initial encounter    Allergic rhinitis, cause unspecified    Anemia, unspecified    Anxiety state, unspecified    Atherosclerosis of aorta (HCC)    TEE, June, 2010, grade 4 aortic arch atherosclerosis , Dr. Shirlee Latch   CAD (coronary artery disease)    DES and circumflex 2006 /  DES to LAD 2011   Carotid artery disease (HCC)    Doppler, November, 2011, stable, 0-39% bilateral, turbulent flow left subclavian   Chronic diastolic heart failure (HCC)    Cyst of thyroid    Diverticulosis of colon (without mention of hemorrhage)    DVT (deep venous thrombosis) (HCC)    in leg   Dysphasia    Possibly esophageal stricture   Esophageal reflux    Hiatal hernia    Hyperlipemia    Hypertension    Lumbago    Lumbar disc disease    MVP (mitral valve prolapse)    Neuropathy    Nonspecific abnormal finding in stool  contents    Obesity, unspecified    Osteoarthrosis, unspecified whether generalized or localized, unspecified site    Osteopenia    Other diseases of lung, not elsewhere classified    PE (pulmonary thromboembolism) (HCC)    both lungs   Peripheral vascular disease, unspecified (HCC)    Personal history of colonic polyps    ADENOMATOUS POLYP   PFO (patent foramen ovale)    Patient had a very small PFO by TEE 2010.  This was seen only by bubble analysis during Valsalva   PONV (postoperative nausea and vomiting)    Sleep apnea    uses CPAP nightly   Sleep related hypoventilation/hypoxemia in conditions classifiable elsewhere    Stroke [facility]) 11/2008   Treated with TPA? /     2010, TEE no left atrial clot, probably from atherosclerosis of the aortic arch   Subclavian artery disease (HCC)    Remote surgery by Dr. Betti Cruz.   Unspecified cerebral artery occlusion with cerebral infarction    Unspecified venous (peripheral) insufficiency    Vitamin B 12 deficiency    Past Surgical History:  Procedure Laterality Date   ACHILLES TENDON SURGERY Right 04/05/2018   Procedure: Right achilles tendon reconstruction;  Surgeon: Toni Arthurs, MD;  Location: [facility];  Service: Orthopedics;  Laterality: Right;    BACK SURGERY     cataracts  both eyes   CHOLECYSTECTOMY     CORONARY ANGIOPLASTY WITH STENT PLACEMENT  1990's   GASTROCNEMIUS RECESSION Right 04/05/2018   Procedure: Gastroc recession;  Surgeon: Toni Arthurs, MD;  Location: [facility];  Service: Orthopedics;  Laterality: Right;   LAPAROSCOPIC HYSTERECTOMY     LUMBAR DISC SURGERY     X 3   RIGHT/LEFT HEART CATH AND CORONARY ANGIOGRAPHY N/A 04/10/2017   Procedure: RIGHT/LEFT HEART CATH AND CORONARY ANGIOGRAPHY;  Surgeon: Lyn Records, MD;  Location: [facility];  Service: Cardiovascular;  Laterality: N/A;   TOTAL KNEE ARTHROPLASTY Right 01/14/2019   Procedure: TOTAL KNEE ARTHROPLASTY;   Surgeon: Ollen Gross, MD;  Location: [facility];  Service: Orthopedics;  Laterality: Right;    TOTAL KNEE ARTHROPLASTY Left 12/23/2019   Procedure: TOTAL KNEE ARTHROPLASTY;  Surgeon: Ollen Gross, MD;  Location: [facility];  Service: Orthopedics;  Laterality: Left;    urethral suspension  1993   Dr. Jennette Kettle   VARICOSE VEIN SURGERY     x 2   No current facility-administered medications on file prior to encounter.   Current Outpatient Medications on File Prior to Encounter  Medication Sig Dispense Refill   albuterol (VENTOLIN HFA) 108 (90 Base) MCG/ACT inhaler Inhale 2 puffs into the lungs every 6 (six) hours as needed for wheezing or shortness of breath. 8 g 2   ALLERGY RELIEF CETIRIZINE 10 MG tablet Take 10 mg by mouth at bedtime.     ARIPiprazole (ABILIFY) 2 MG tablet Take 1 tablet (2 mg total) by mouth every evening. 30 tablet 6   aspirin EC 81 MG tablet Take 1 tablet (81 mg total) by mouth daily. Swallow whole. 90 tablet 3   benzonatate (TESSALON) 200 MG capsule Take 1 capsule (200 mg total) by mouth 3 (three) times daily as needed for cough. 45 capsule 0   dicyclomine (BENTYL) 20 MG tablet Take 1 tablet (20 mg total) by mouth 3 (three) times daily as needed for muscle spasms. (Patient taking differently: Take 20 mg by mouth 3 (three) times daily as needed for spasms.) 90 tablet 1   furosemide (LASIX) 40 MG tablet Take 1 tablet (40 mg total) by mouth daily. 90 tablet 3   gabapentin (NEURONTIN) 300 MG capsule Take 600 mg by mouth 3 (three) times daily.      hyoscyamine (LEVSIN) 0.125 MG tablet 1-2 tabs every 4-6 hours prn 30 tablet 1   isosorbide mononitrate (IMDUR) 30 MG 24 hr tablet Take 0.5 tablets (15 mg total) by mouth at bedtime. 45 tablet 3   latanoprost (XALATAN) 0.005 % ophthalmic solution Place 1 drop into both eyes at bedtime.     metoprolol tartrate (LOPRESSOR) 50 MG tablet Take 1 tablet (50 mg total) by mouth 2 (two) times daily. 180 tablet 3   mirabegron ER (MYRBETRIQ) 25  MG TB24 tablet Take 1 tablet (25 mg total) by mouth daily. 90 tablet 3   nitroGLYCERIN (NITROSTAT) 0.4 MG SL tablet Place 0.4 mg under the tongue every 5 (five) minutes as needed for chest pain (TAKE UP TO 3 DOSES BEFORE CALLING 911).     nystatin cream (MYCOSTATIN) Apply 1 Application topically 2 (two) times daily. (Patient taking differently: Apply 1 Application topically 2 (two) times daily as needed (for itching under the breasts).) 30 g 1   OXYGEN Inhale 2 L/min into the lungs as needed (for shortness of breath).     pantoprazole (PROTONIX) 40 MG tablet Take 40 mg by mouth  2 (two) times daily before a meal.     Polyethylene Glycol 3350 (PEG 3350) 17 GM/SCOOP POWD Mix 17 g (1 capful) in 8oz of liquid and drink by mouth daily for constipation. 510 g 6   potassium chloride (KLOR-CON) 10 MEQ tablet Take 1 tablet (10 mEq total) by mouth in the morning. 90 tablet 2   PRESCRIPTION MEDICATION See admin instructions. CPAP- At bedtime     rosuvastatin (CRESTOR) 10 MG tablet Take 1 tablet (10 mg total) by mouth at bedtime. 90 tablet 3   tiZANidine (ZANAFLEX) 4 MG tablet Take 4 mg by mouth daily as needed for muscle spasms.     TYLENOL 500 MG tablet Take 500-1,500 mg by mouth 2 (two) times daily as needed for mild pain or headache.     [DISCONTINUED] metoprolol tartrate (LOPRESSOR) 25 MG tablet Take 1 tablet (25 mg total) by mouth 2 (two) times daily. (Patient taking differently: Take by mouth 2 (two) times daily.) 180 tablet 3   Significant Hospital Events: Including procedures, antibiotic start and stop dates in addition to other pertinent events   1/29: Presented with ICH. Intubated. Propofol. Cleviprex. PCCM consulted for vent management 1/29  Seen by neurosurgery who did not feel that clot evacuation would beneficial.  Interim History / Subjective:  Admitted overnight.   Objective   Blood pressure (!) 135/57, pulse 71, temperature (!) 95.2 F (35.1 C), temperature source Core (Comment), resp.  rate 20, height 5\' 8"  (1.727 m), weight 81.6 kg, SpO2 100%.    Vent Mode: PRVC FiO2 (%):  [100 %] 100 % Set Rate:  [20 bmp] 20 bmp Vt Set:  [510 mL] 510 mL PEEP:  [5 cmH20] 5 cmH20 Plateau Pressure:  [16 cmH20] 16 cmH20  No intake or output data in the 24 hours ending 07/13/23 0619 Filed Weights   07/13/23 0345  Weight: 81.6 kg   Physical Exam HENT:     Mouth/Throat:     Comments: ETT and OGT in place Eyes:     Conjunctiva/sclera: Conjunctivae normal.  Cardiovascular:     Rate and Rhythm: Normal rate.     Heart sounds: Normal heart sounds.  Pulmonary:     Breath sounds: Normal breath sounds.  Abdominal:     General: Abdomen is flat.     Palpations: Abdomen is soft.  Genitourinary:    Comments: Foley in place Skin:    General: Skin is warm and dry.  Neurological:     Mental Status: She is unresponsive.     GCS: GCS eye subscore is 1. GCS verbal subscore is 1. GCS motor subscore is 5.     Motor: Weakness (no response on right side) present.     Ancillary Tests Personally Reviewed  Large left frontal hematoma with mass effect on anterior horn of left lateral ventricle.   Assessment & Plan:  Acute Non Traumatic Left Frontal ICH - ICH score 3 Acute Respiratory Failure due to Inability to Protect Airway in the setting of ICH Emphysema, (Noted on  CTA head/neck) Pulmonary Nodule RUL, 6 mm, Incidental finding OSA on CPAP Hypertensive Emergency Essential Hypertension Hyperlipidemia Anemia Thrombocytopenia, chronic  Plan:   - MRI today and follow examination.  - 3% saline to mitigate edema.  Target 150-155 - Follow for early improvement.  - Family informed that prognosis is guarded particularly if ability to swallow doesn't return.   Daily Goals Checklist  Pain/Anxiety/Delirium protocol (if indicated): Propofol. Wean to off once MRI completed.  Neuro vitals: every 2  hours AED's: none VAP protocol (if indicated): in place Respiratory support goals: Full support  today Blood pressure target: 130-150. On clevidipine DVT prophylaxis: Start chemical prophylaxis if follow-up scan stable Nutrition Status: at moderate nutritional risk. Will start feeds and Cortrak ordered.  GI prophylaxis: H2  Fluid status goals: allow autoregulation.  Urinary catheter: Foley Central lines: NA Glucose control: sliding scale and tube feed coverage. Mobility/therapy needs: NA Antibiotic de-escalation: NA Home medication reconciliation: holding for now. On a lot of potentially sedating medications.  Daily labs: Bmet Code Status: Full pendent further discussion regarding prognosis.  Family Communication: Family updated at the bedside.  Disposition: ICU.   CRITICAL CARE Performed by: Lynnell Catalan   Total critical care time: 40 minutes  Critical care time was exclusive of separately billable procedures and treating other patients.  Critical care was necessary to treat or prevent imminent or life-threatening deterioration.  Critical care was time spent personally by me on the following activities: development of treatment plan with patient and/or surrogate as well as nursing, discussions with consultants, evaluation of patient's response to treatment, examination of patient, obtaining history from patient or surrogate, ordering and performing treatments and interventions, ordering and review of laboratory studies, ordering and review of radiographic studies, pulse oximetry, re-evaluation of patient's condition and participation in multidisciplinary rounds.  Lynnell Catalan, MD [facility] ICU Physician [facility] [place] [facility]  Pager: [phone] Mobile: [phone] After hours: [phone].

## 2023-07-13 NOTE — Progress Notes (Addendum)
STROKE TEAM PROGRESS NOTE   BRIEF HPI [name] is a 83 y.o. 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   NIH on Admission 27   SIGNIFICANT HOSPITAL EVENTS 1/29 code stroke. CTH arge left frontal lobe IPH with mass effect, edema, midline shift of about 14mm. Had to be intubated prior to getting CTA head and neck.   INTERIM HISTORY/SUBJECTIVE Family is at the bedside, and has been updated by Dr.Daran Favaro.  Patient arrived to the hospital as a code stroke with complaints of headache and vomiting and then developed right side weakness and abruptly stopped talking.  She was noted to be hypertensive with SBP 220.  CT head revealed large left frontal ICH with cerebral edema, and midline shift of 14 mm.  Patient was intubated in the emergency room She received 250 cc bolus of hypertonic saline and then started on a continuous drip at 75 cc an hour last sodium was 137.  She is on Cleviprex drip at 5 MG.  He is not on any sedation, propofol has been turned off  Neurological exam her eyes are closed, she does not open them to stimulation, left pupil is smaller than right both are sluggish bilaterally.  She is purposeful on the left, right arm is flaccid and right lower leg withdraws to pain.  Will get MRI today  OBJECTIVE  CBC    Component Value Date/Time   WBC 2.0 (L) 07/13/2023 0333   RBC 4.23 07/13/2023 0333   HGB 10.5 (L) 07/13/2023 0447   HGB 12.6 06/22/2022 1025   HGB 12.3 04/06/2017 1135   HCT 31.0 (L) 07/13/2023 0447   HCT 37.0 04/06/2017 1135   PLT 80 (L) 07/13/2023 0752   PLT 125 (L) 06/22/2022 1025   PLT 190 04/06/2017 1135   MCV 87.2 07/13/2023 0333   MCV 91 04/06/2017 1135   MCH 26.7 07/13/2023 0333   MCHC 30.6 07/13/2023 0333   RDW 19.6  (H) 07/13/2023 0333   RDW 15.6 (H) 04/06/2017 1135   LYMPHSABS 1.0 07/13/2023 0333   LYMPHSABS 1.6 04/06/2017 1135   MONOABS 0.1 07/13/2023 0333   EOSABS 0.1 07/13/2023 0333   EOSABS 0.2 04/06/2017 1135   BASOSABS 0.0 07/13/2023 0333   BASOSABS 0.0 04/06/2017 1135    BMET    Component Value Date/Time   NA 143 07/13/2023 0948   NA 143 03/07/2019 1534   K 3.4 (L) 07/13/2023 0447   CL 104 07/13/2023 0335   CO2 23 07/13/2023 0333   GLUCOSE 155 (H) 07/13/2023 0335   BUN 18 07/13/2023 0335   BUN 13 03/07/2019 1534   CREATININE 1.10 (H) 07/13/2023 0335   CREATININE 1.01 (H) 06/22/2022 1025   CALCIUM 9.8 07/13/2023 0333   GFRNONAA 48 (L) 07/13/2023 0333   GFRNONAA 55 (L) 06/22/2022 1025    IMAGING past 24 hours DG Chest Portable 1 View Result Date: 07/13/2023 CLINICAL DATA:  83 year old female with history of intubation. EXAM: PORTABLE CHEST 1 VIEW COMPARISON:  Chest x-ray 05/28/2023. FINDINGS: An endotracheal tube is in place with tip 5.5 cm above the carina. A nasogastric tube is seen extending into the stomach, however, the tip of the nasogastric tube extends below the lower margin of the image. Lung volumes are low. Opacity  at the left base which may reflect atelectasis and/or consolidation, with superimposed small left pleural effusion. Probable subsegmental atelectasis also noted in the medial aspect of the right lung base. No pneumothorax. No evidence of pulmonary edema. Heart size is mildly enlarged. Upper mediastinal contours are within normal limits. Atherosclerotic calcifications are noted in the thoracic aorta. IMPRESSION: 1. Support apparatus, as above. 2. Low lung volumes with atelectasis and/or consolidation in the left lower lobe with small left pleural effusion. 3. Mild cardiomegaly. 4. Aortic atherosclerosis. Electronically Signed   By: Trudie Reed M.D.   On: 07/13/2023 05:32   CT ANGIO HEAD NECK W WO CM (CODE STROKE) Result Date: 07/13/2023 CLINICAL DATA:   Right-sided weakness with altered mental status EXAM: CT ANGIOGRAPHY HEAD AND NECK WITH AND WITHOUT CONTRAST TECHNIQUE: Multidetector CT imaging of the head and neck was performed using the standard protocol during bolus administration of intravenous contrast. Multiplanar CT image reconstructions and MIPs were obtained to evaluate the vascular anatomy. Carotid stenosis measurements (when applicable) are obtained utilizing NASCET criteria, using the distal internal carotid diameter as the denominator. RADIATION DOSE REDUCTION: This exam was performed according to the departmental dose-optimization program which includes automated exposure control, adjustment of the mA and/or kV according to patient size and/or use of iterative reconstruction technique. CONTRAST:  75mL OMNIPAQUE IOHEXOL 350 MG/ML SOLN COMPARISON:  Head CT from earlier today FINDINGS: CTA NECK FINDINGS Aortic arch: Atheromatous calcification with 3 vessel branching Right carotid system: Bulky calcified plaque at the proximal brachiocephalic, stenosis measuring 70% on coronal reformats. There is diffuse common carotid and proximal ICA calcified plaque without flow reducing stenosis or ulceration. Proximal ICA narrowing measures 40% on coronal reformats. Left carotid system: Calcified plaque scattered along the common carotid and proximal ICA without ulceration or flow reducing stenosis of the ICA. Calcified plaque causes high-grade narrowing at the origin of the ECA. 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 The vertebral arteries are widely patent and smoothly contoured to the dura on both sides. Calcified plaque at the right vertebral origin causes mild stenosis. Skeleton: No acute finding Other neck: 2.4 cm right thyroid nodule with heterogeneous enhancement, stable by chest CT since at least 2017. No follow-up recommended unless clinically warranted (ref: J Am Coll Radiol. 2015  Feb;12(2): 143-50). Unremarkable hardware in the trachea and esophagus where covered in the neck. Upper chest: Emphysema. 6 mm nodule in the right upper lobe on 6:11. Review of the MIP images confirms the above findings CTA HEAD FINDINGS Anterior circulation: Diffuse atheromatous calcification of the cavernous carotids. No branch occlusion, beading, aneurysm, or evidence of vascular malformation. There is a punctate density at the posterior aspect of the hematoma marked on coronal reformats. Posterior circulation: The vertebral and basilar arteries are widely patent. Atheromatous irregularity of the posterior cerebral arteries without proximal flow reducing stenosis. No aneurysm or vascular malformation. Venous sinuses: No early enhancement. Anatomic variants: None significant Review of the MIP images confirms the above findings IMPRESSION: 1. No vascular lesion or proximal occlusion seen underlying the patient's left frontal ICH. Equivocal for spot sign at the posterior hematoma. 2. Generalized atherosclerosis. 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 3. Over 50% stenosis at the brachiocephalic artery due to bulky calcified plaque. 4. Emphysema and 6 mm pulmonary nodule in the right upper lobe. Non-contrast chest CT at 6-12 months is recommended. If the nodule is stable at time of repeat CT, then future  CT at 18-24 months (from today's scan) is considered optional for low-risk patients, but is recommended for high-risk patients. This recommendation follows the consensus statement: Guidelines for Management of Incidental Pulmonary Nodules Detected on CT Images: From the Fleischner Society 2017; Radiology 2017; 284:228-243. Electronically Signed   By: Tiburcio Pea M.D.   On: 07/13/2023 05:19   CT HEAD CODE STROKE WO CONTRAST Result Date: 07/13/2023 CLINICAL DATA:  Code stroke.  Neuro deficit, acute, stroke suspected EXAM: CT HEAD WITHOUT CONTRAST  TECHNIQUE: Contiguous axial images were obtained from the base of the skull through the vertex without intravenous contrast. RADIATION DOSE REDUCTION: This exam was performed according to the departmental dose-optimization program which includes automated exposure control, adjustment of the mA and/or kV according to patient size and/or use of iterative reconstruction technique. COMPARISON:  None Available. FINDINGS: Brain: Large multifocal acute intraparenchymal hemorrhages in the left frontal lobe, measuring up to approximately 6.1 x 5.8 x 5.0 cm. Surrounding edema and mass effect with approximately 1.4 cm of rightward midline shift. There is loss of gray-white differentiation surrounding the hemorrhage. No hydrocephalus. Moderate to advanced patchy white matter hypodensities, nonspecific but compatible with chronic microvascular ischemic disease. Vascular: No hyperdense vessel identified. Skull: No acute fracture. Sinuses/Orbits: Clear sinuses.  No acute orbital findings. Other: No mastoid effusions. IMPRESSION: 1. Large (6.1 cm) multifocal/heterogeneous intraparenchymal hemorrhage in the left frontal lobe with 1.4 cm of rightward midline shift. Given loss of grey-white differentiation surrounding the hemorrhage, hemorrhagic conversion of an infarct is a consideration. Follow-up MRI could further evaluate. 2. Small volume of overlying extra-axial extension. Code stroke imaging results were communicated on 07/13/2023 at 3:53 AM to provider Dr. Derry Lory Via telephone, who verbally acknowledged these results. Electronically Signed   By: Feliberto Harts M.D.   On: 07/13/2023 04:01    Vitals:   07/13/23 1230 07/13/23 1245 07/13/23 1300 07/13/23 1315  BP: (!) 144/62 (!) 144/65 (!) 146/62 (!) 147/69  Pulse: 76 80 81 85  Resp: 20 20 20 20   Temp: 98.6 F (37 C) 98.6 F (37 C) 98.8 F (37.1 C) 99 F (37.2 C)  TempSrc:      SpO2: 99% 99% 100% 100%  Weight:      Height:         PHYSICAL EXAM General:  Critically ill elderly female, intubated Psych:  Mood and affect appropriate for situation CV: Regular rate and rhythm on monitor Respiratory:  Regular, unlabored respirations on ventilator GI: Abdomen soft and nontender   NEURO:  Mental Status: Intubated on no sedation, eyes are closed does not open eyes, does not follow commands Speech/Language: Unable to assess  Cranial Nerves:  II: PERRL. Visual fields full.  III, IV, VI: EOMI. Eyelids elevate symmetrically.  V: Sensation is intact to light touch and symmetrical to face.  VII: Able to assess due to ET tube VIII: hearing intact to voice. IX, X: Palate elevates symmetrically. Phonation is normal.  YN:[id number] shrug 5/5. XII: tongue is midline without fasciculations. Motor: Purposeful on left upper arm, right arm is flaccid, withdraws bilaterally on lowers Tone: is normal and bulk is normal Sensation-responds to noxious stimuli bilaterally Coordination: Able to assess Gait- deferred  Most Recent NIH 22    ASSESSMENT/PLAN  Intracerebral Hemorrhage: Nontraumatic left frontal with cytotoxic edema and midline shift and transfalcine 14 mm left to right brain herniation Etiology:  likely hypertensive  Code Stroke CT head Large (6.1 cm) multifocal/heterogeneous intraparenchymal hemorrhage in the left frontal lobe with 1.4 cm of rightward midline  shift CTA head & neck No vascular lesion or proximal occlusion  MRI  ordered 2D Echo ordered LDL 63 HgbA1c 5.5 VTE prophylaxis -SCDs aspirin 81 mg daily prior to admission, now on No antithrombotic  Therapy recommendations: Pending Disposition: Ending  Hx of Stroke/TIA June 2010 was given TNK  Cytotoxic edema and brain compression On hypertonic saline protocol Received 250 cc bolus on admission 3% at 75 cc Last sodium 143 Serial sodium levels every 6 hours  Hypertension Hypertensive emergency CAD Diastolic CHF History PFO Home meds: Lasix 40 mg, Imdur 30 mg, metoprolol 50  mg, UnStable On Cleviprex drip at 5 mg As needed labetalol and hydralazine to maintain BP Start amlodipine 10 mg Blood Pressure Goal: SBP between 130-150 for 24 hours and then less than 160   Hyperlipidemia Home meds: Crestor 10 mg, not resumed in hospital LDL 63 goal < 70 Hold in the setting of acute bleed Continue statin at discharge  Acute respiratory failure due to ICH Chronic hypoxic respiratory failure History of PE/DVT OSA on CPAP On ventilator managed per CCM Neuroprotective measures Sedation per CCM  Tobacco Abuse Cigarette smoker 40 total pack years   Dysphagia-history GERD Patient has post-stroke dysphagia, SLP consulted    Diet   Diet NPO time specified   Will need core track Tube feeds to be started Nutrition consult  Chronic thrombocytopenia Leukopenia Anemia WBC 2.0 Platelets 75-received platelets overnight Will continue to monitor  Other Stroke Risk Factors Coronary artery disease Congestive heart failure Obstructive sleep apnea, on CPAP at home  Other Active Problems Anxiety PVD B12 deficiency GERD Neuropathy AKI-CR 1.13, will continue to monitor  Hospital day # 0   Gevena Mart DNP, ACNPC-AG  [facility]  STROKE MD NOTE :  I have personally obtained history,examined this patient, reviewed notes, independently viewed imaging studies, participated in medical decision making and plan of care.ROS completed by me personally and pertinent positives fully documented  I have made any additions or clarifications directly to the above note. Agree with note above.  Patient presented with headache right-sided weakness and aphasia and CT scan showed a large left frontal parenchymal hemorrhage with cytotoxic edema and left-to-right transfalcine brain herniation with mass effect on the frontal horn with ICH score of 3.  Neurological exam is quite poor and chances of surviving this admission meaningful improvement in living independently are  quite negligible.  Continue hypertonic saline with serum sodium goal 150-155 strict blood pressure control with systolic goal between 130-154) 4 hours and then below 160.  I had a long discussion at bedside with the patient's son and significant other about her neurological presentation, prognosis, treatment plan and answered questions.  Family seem reasonable and agreed to DNR and would likely move towards comfort As They Feel Patient Would Not Want Prolonged Ventilatory Support Feeding Tube and Nursing Home Unavoidable.  Discussed with Dr. Denese Killings Critical Care Medicine. This patient is critically ill and at significant risk of neurological worsening, death and care requires constant monitoring of vital signs, hemodynamics,respiratory and cardiac monitoring, extensive review of multiple databases, frequent neurological assessment, discussion with family, other specialists and medical decision making of high complexity.I have made any additions or clarifications directly to the above note.This critical care time does not reflect procedure time, or teaching time or supervisory time of PA/NP/Med Resident etc but could involve care discussion time.  I spent 30 minutes of neurocritical care time  in the care of  this patient.      Delia Heady, MD  Medical Director [facility] Pager: [phone] 07/13/2023 2:47 PM   To contact Stroke Continuity provider, please refer to [email or web address]. After hours, contact General Neurology

## 2023-07-13 NOTE — Progress Notes (Signed)
Patient ID: Stephanie Alvarado, female   DOB: 06-26-1940, 84 y.o.   MRN: [id number] BP (!) 135/57 (BP Location: Right Arm)   Pulse 71   Temp (!) 95.2 F (35.1 C) (Core (Comment))   Resp 20   Ht 5\' 8"  (1.727 m)   Wt 81.6 kg   SpO2 100%   BMI 27.37 kg/m  Films reviewed. Left frontal ich. Some mass effect though mainly local. Basal cisterns are patent. Do not believe evacuation would be beneficial given size and age.

## 2023-07-14 ENCOUNTER — Inpatient Hospital Stay (HOSPITAL_COMMUNITY): Payer: PPO

## 2023-07-14 ENCOUNTER — Other Ambulatory Visit: Payer: Self-pay

## 2023-07-14 DIAGNOSIS — D649 Anemia, unspecified: Secondary | ICD-10-CM

## 2023-07-14 DIAGNOSIS — I5030 Unspecified diastolic (congestive) heart failure: Secondary | ICD-10-CM

## 2023-07-14 DIAGNOSIS — E876 Hypokalemia: Secondary | ICD-10-CM

## 2023-07-14 DIAGNOSIS — I10 Essential (primary) hypertension: Secondary | ICD-10-CM

## 2023-07-14 DIAGNOSIS — I69391 Dysphagia following cerebral infarction: Secondary | ICD-10-CM

## 2023-07-14 DIAGNOSIS — I618 Other nontraumatic intracerebral hemorrhage: Secondary | ICD-10-CM

## 2023-07-14 DIAGNOSIS — E872 Acidosis, unspecified: Secondary | ICD-10-CM

## 2023-07-14 DIAGNOSIS — I611 Nontraumatic intracerebral hemorrhage in hemisphere, cortical: Secondary | ICD-10-CM | POA: Diagnosis not present

## 2023-07-14 DIAGNOSIS — J9600 Acute respiratory failure, unspecified whether with hypoxia or hypercapnia: Secondary | ICD-10-CM | POA: Diagnosis not present

## 2023-07-14 LAB — BASIC METABOLIC PANEL
Anion gap: 11 (ref 5–15)
BUN: 21 mg/dL (ref 8–23)
CO2: 19 mmol/L — ABNORMAL LOW (ref 22–32)
Calcium: 8.7 mg/dL — ABNORMAL LOW (ref 8.9–10.3)
Chloride: 114 mmol/L — ABNORMAL HIGH (ref 98–111)
Creatinine, Ser: 0.9 mg/dL (ref 0.44–1.00)
GFR, Estimated: >60 mL/min (ref >60)
Glucose, Bld: 202 mg/dL — ABNORMAL HIGH (ref 70–99)
Potassium: 3.4 mmol/L — ABNORMAL LOW (ref 3.5–5.1)
Sodium: 144 mmol/L (ref 135–145)

## 2023-07-14 LAB — CBC
HCT: 31.3 % — ABNORMAL LOW (ref 36.0–46.0)
Hemoglobin: 9.5 g/dL — ABNORMAL LOW (ref 12.0–15.0)
MCH: 26.5 pg (ref 26.0–34.0)
MCHC: 30.4 g/dL (ref 30.0–36.0)
MCV: 87.4 fL (ref 80.0–100.0)
Platelets: 73 10*3/uL — ABNORMAL LOW (ref 150–400)
RBC: 3.58 MIL/uL — ABNORMAL LOW (ref 3.87–5.11)
RDW: 20.2 % — ABNORMAL HIGH (ref 11.5–15.5)
WBC: 2.9 10*3/uL — ABNORMAL LOW (ref 4.0–10.5)
nRBC: 0 % (ref 0.0–0.2)

## 2023-07-14 LAB — ECHOCARDIOGRAM COMPLETE
AR max vel: 2.64 cm2
AV Area VTI: 2.36 cm2
AV Area mean vel: 2.54 cm2
AV Mean grad: 8 mm[Hg]
AV Peak grad: 15 mm[Hg]
Ao pk vel: 1.93 m/s
Area-P 1/2: 5.54 cm2
Height: 68 in
S' Lateral: 3.4 cm
Weight: 2761.92 [oz_av]

## 2023-07-14 LAB — MAGNESIUM
Magnesium: 2.4 mg/dL (ref 1.7–2.4)
Magnesium: 2.4 mg/dL (ref 1.7–2.4)

## 2023-07-14 LAB — GLUCOSE, CAPILLARY
Glucose-Capillary: 123 mg/dL — ABNORMAL HIGH (ref 70–99)
Glucose-Capillary: 125 mg/dL — ABNORMAL HIGH (ref 70–99)
Glucose-Capillary: 130 mg/dL — ABNORMAL HIGH (ref 70–99)
Glucose-Capillary: 142 mg/dL — ABNORMAL HIGH (ref 70–99)
Glucose-Capillary: 150 mg/dL — ABNORMAL HIGH (ref 70–99)
Glucose-Capillary: 155 mg/dL — ABNORMAL HIGH (ref 70–99)

## 2023-07-14 LAB — BPAM PLATELET PHERESIS
Blood Product Expiration Date: 202501312359
ISSUE DATE / TIME: 202501300436
Unit Type and Rh: 7300

## 2023-07-14 LAB — PREPARE PLATELET PHERESIS: Unit division: 0

## 2023-07-14 LAB — PHOSPHORUS
Phosphorus: 2.6 mg/dL (ref 2.5–4.6)
Phosphorus: 1.9 mg/dL — ABNORMAL LOW (ref 2.5–4.6)

## 2023-07-14 LAB — TRIGLYCERIDES: Triglycerides: 96 mg/dL (ref <150)

## 2023-07-14 LAB — SODIUM
Sodium: 143 mmol/L (ref 135–145)
Sodium: 150 mmol/L — ABNORMAL HIGH (ref 135–145)
Sodium: 153 mmol/L — ABNORMAL HIGH (ref 135–145)
Sodium: 154 mmol/L — ABNORMAL HIGH (ref 135–145)

## 2023-07-14 MED ORDER — METOPROLOL TARTRATE 25 MG PO TABS
25.0000 mg | ORAL_TABLET | Freq: Two times a day (BID) | ORAL | Status: DC
  Administered 2023-07-14: 25 mg
  Filled 2023-07-14: qty 1

## 2023-07-14 MED ORDER — SODIUM CHLORIDE 0.9 % IV BOLUS: 250.0000 mL | Freq: Once | INTRAVENOUS | Status: DC

## 2023-07-14 MED ORDER — POTASSIUM CHLORIDE 20 MEQ PO PACK
40.0000 meq | PACK | Freq: Once | ORAL | Status: AC
  Administered 2023-07-14: 40 meq
  Filled 2023-07-14: qty 2

## 2023-07-14 MED ORDER — HYDRALAZINE HCL 20 MG/ML IJ SOLN: 10.0000 mg | INTRAMUSCULAR | Status: DC | PRN

## 2023-07-14 MED ORDER — METOPROLOL TARTRATE 50 MG PO TABS
50.0000 mg | ORAL_TABLET | Freq: Two times a day (BID) | ORAL | Status: DC
  Administered 2023-07-14 – 2023-07-16 (×4): 50 mg
  Filled 2023-07-14 (×4): qty 1

## 2023-07-14 MED ORDER — LABETALOL HCL 5 MG/ML IV SOLN: 10.0000 mg | INTRAVENOUS | Status: DC | PRN

## 2023-07-14 MED ORDER — SODIUM CHLORIDE 3 % IV SOLN
INTRAVENOUS | Status: DC
  Filled 2023-07-14: qty 500

## 2023-07-14 NOTE — Progress Notes (Signed)
OT Cancellation Note  Patient Details Name: Stephanie Alvarado MRN: [id number] DOB: 07/01/1940   Cancelled Treatment:    Reason Eval/Treat Not Completed: Active bedrest order;Other (comment) (Intubated. Will assess when activity orders updated and medically appropriate.)  [facility] 07/14/2023, 6:34 AM Luisa Dago, OT/L   Acute OT Clinical Specialist Acute Rehabilitation Services Pager [phone] Office [phone]

## 2023-07-14 NOTE — Progress Notes (Signed)
Notified CCM of low urine output.  Only 32 mL in about 2 hrs.  Awaiting orders.

## 2023-07-14 NOTE — Progress Notes (Signed)
*  PRELIMINARY RESULTS* Echocardiogram 2D Echocardiogram has been performed.  Stephanie Alvarado 07/14/2023, 10:00 AM

## 2023-07-14 NOTE — Progress Notes (Signed)
PT Cancellation Note  Patient Details Name: Stephanie Alvarado MRN: [id number] DOB: 1940-11-28   Cancelled Treatment:    Reason Eval/Treat Not Completed: Active bedrest order;Medical issues which prohibited therapy - intubated, sedated, PT To check back.   Marye Round, PT DPT Acute Rehabilitation Services Secure Chat Preferred  Office [phone]    Truddie Coco 07/14/2023, 9:32 AM

## 2023-07-14 NOTE — Progress Notes (Signed)
NAME:  Stephanie Alvarado, MRN:  [id number], DOB:  [birth year], LOS: 1 ADMISSION DATE:  07/13/2023, CONSULTATION DATE:  07/13/2023 REFERRING MD:  Erick Blinks  CHIEF COMPLAINT:  ICH with respiratory failure   History of Present Illness:  Stephanie Alvarado is an 83 y.o. 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 According to chart review, patient reported a headache, nausea, and vomiting around 1830 1/29. She went to bed at 11 pm, awoke at 3 am with right sided weakness and expressive aphasia. EMS was called, upon arrival to ER SBP 220, NIHSS 27, Head CT large left frontal ICH w/ 14 mm MLS, CTA head/neck states no vascular lesion or occlusion seen underlying ICH, unclear if spot sign, incidental pulmonary nodule (see full report).  She was intubated for airway protection and admitted to the Neuro ICU for further management.   Pertinent  Medical History   Past Medical History:  Diagnosis Date   Achilles tendon rupture, right, initial encounter    Allergic rhinitis, cause unspecified    Anemia, unspecified    Anxiety state, unspecified    Atherosclerosis of aorta (HCC)    TEE, June, 2010, grade 4 aortic arch atherosclerosis , Dr. Shirlee Latch   CAD (coronary artery disease)    DES and circumflex 2006 /  DES to LAD 2011   Carotid artery disease (HCC)    Doppler, November, 2011, stable, 0-39% bilateral, turbulent flow left subclavian   Chronic diastolic heart failure (HCC)    Cyst of thyroid    Diverticulosis of colon (without mention of hemorrhage)    DVT (deep venous thrombosis) (HCC)    in leg   Dysphasia    Possibly esophageal stricture   Esophageal reflux    Hiatal hernia    Hyperlipemia    Hypertension    Lumbago    Lumbar disc disease    MVP (mitral valve prolapse)    Neuropathy    Nonspecific abnormal finding in stool  contents    Obesity, unspecified    Osteoarthrosis, unspecified whether generalized or localized, unspecified site    Osteopenia    Other diseases of lung, not elsewhere classified    PE (pulmonary thromboembolism) (HCC)    both lungs   Peripheral vascular disease, unspecified (HCC)    Personal history of colonic polyps    ADENOMATOUS POLYP   PFO (patent foramen ovale)    Patient had a very small PFO by TEE 2010.  This was seen only by bubble analysis during Valsalva   PONV (postoperative nausea and vomiting)    Sleep apnea    uses CPAP nightly   Sleep related hypoventilation/hypoxemia in conditions classifiable elsewhere    Stroke [facility]) 11/2008   Treated with TPA? /     2010, TEE no left atrial clot, probably from atherosclerosis of the aortic arch   Subclavian artery disease (HCC)    Remote surgery by Dr. Betti Cruz.   Unspecified cerebral artery occlusion with cerebral infarction    Unspecified venous (peripheral) insufficiency    Vitamin B 12 deficiency    Past Surgical History:  Procedure Laterality Date   ACHILLES TENDON SURGERY Right 04/05/2018   Procedure: Right achilles tendon reconstruction;  Surgeon: Toni Arthurs, MD;  Location: [facility];  Service: Orthopedics;  Laterality: Right;    BACK SURGERY     cataracts  both eyes   CHOLECYSTECTOMY     CORONARY ANGIOPLASTY WITH STENT PLACEMENT  1990's   GASTROCNEMIUS RECESSION Right 04/05/2018   Procedure: Gastroc recession;  Surgeon: Toni Arthurs, MD;  Location: [facility];  Service: Orthopedics;  Laterality: Right;   LAPAROSCOPIC HYSTERECTOMY     LUMBAR DISC SURGERY     X 3   RIGHT/LEFT HEART CATH AND CORONARY ANGIOGRAPHY N/A 04/10/2017   Procedure: RIGHT/LEFT HEART CATH AND CORONARY ANGIOGRAPHY;  Surgeon: Lyn Records, MD;  Location: [facility];  Service: Cardiovascular;  Laterality: N/A;   TOTAL KNEE ARTHROPLASTY Right 01/14/2019   Procedure: TOTAL KNEE ARTHROPLASTY;   Surgeon: Ollen Gross, MD;  Location: [facility];  Service: Orthopedics;  Laterality: Right;    TOTAL KNEE ARTHROPLASTY Left 12/23/2019   Procedure: TOTAL KNEE ARTHROPLASTY;  Surgeon: Ollen Gross, MD;  Location: [facility];  Service: Orthopedics;  Laterality: Left;    urethral suspension  1993   Dr. Jennette Kettle   VARICOSE VEIN SURGERY     x 2   No current facility-administered medications on file prior to encounter.   Current Outpatient Medications on File Prior to Encounter  Medication Sig Dispense Refill   albuterol (VENTOLIN HFA) 108 (90 Base) MCG/ACT inhaler Inhale 2 puffs into the lungs every 6 (six) hours as needed for wheezing or shortness of breath. 8 g 2   ALLERGY RELIEF CETIRIZINE 10 MG tablet Take 10 mg by mouth at bedtime.     ARIPiprazole (ABILIFY) 2 MG tablet Take 1 tablet (2 mg total) by mouth every evening. 30 tablet 6   aspirin EC 81 MG tablet Take 1 tablet (81 mg total) by mouth daily. Swallow whole. 90 tablet 3   benzonatate (TESSALON) 200 MG capsule Take 1 capsule (200 mg total) by mouth 3 (three) times daily as needed for cough. 45 capsule 0   dicyclomine (BENTYL) 20 MG tablet Take 1 tablet (20 mg total) by mouth 3 (three) times daily as needed for muscle spasms. (Patient taking differently: Take 20 mg by mouth 3 (three) times daily as needed for spasms.) 90 tablet 1   furosemide (LASIX) 40 MG tablet Take 1 tablet (40 mg total) by mouth daily. 90 tablet 3   gabapentin (NEURONTIN) 300 MG capsule Take 600 mg by mouth 3 (three) times daily.      hyoscyamine (LEVSIN) 0.125 MG tablet 1-2 tabs every 4-6 hours prn 30 tablet 1   isosorbide mononitrate (IMDUR) 30 MG 24 hr tablet Take 0.5 tablets (15 mg total) by mouth at bedtime. 45 tablet 3   latanoprost (XALATAN) 0.005 % ophthalmic solution Place 1 drop into both eyes at bedtime.     metoprolol tartrate (LOPRESSOR) 50 MG tablet Take 1 tablet (50 mg total) by mouth 2 (two) times daily. 180 tablet 3   mirabegron ER (MYRBETRIQ) 25  MG TB24 tablet Take 1 tablet (25 mg total) by mouth daily. 90 tablet 3   nitroGLYCERIN (NITROSTAT) 0.4 MG SL tablet Place 0.4 mg under the tongue every 5 (five) minutes as needed for chest pain (TAKE UP TO 3 DOSES BEFORE CALLING 911).     nystatin cream (MYCOSTATIN) Apply 1 Application topically 2 (two) times daily. (Patient taking differently: Apply 1 Application topically 2 (two) times daily as needed (for itching under the breasts).) 30 g 1   OXYGEN Inhale 2 L/min into the lungs as needed (for shortness of breath).     pantoprazole (PROTONIX) 40 MG tablet Take 40 mg by mouth  2 (two) times daily before a meal.     Polyethylene Glycol 3350 (PEG 3350) 17 GM/SCOOP POWD Mix 17 g (1 capful) in 8oz of liquid and drink by mouth daily for constipation. 510 g 6   potassium chloride (KLOR-CON) 10 MEQ tablet Take 1 tablet (10 mEq total) by mouth in the morning. 90 tablet 2   PRESCRIPTION MEDICATION See admin instructions. CPAP- At bedtime     rosuvastatin (CRESTOR) 10 MG tablet Take 1 tablet (10 mg total) by mouth at bedtime. 90 tablet 3   tiZANidine (ZANAFLEX) 4 MG tablet Take 4 mg by mouth daily as needed for muscle spasms.     TYLENOL 500 MG tablet Take 500-1,500 mg by mouth 2 (two) times daily as needed for mild pain or headache.     [DISCONTINUED] metoprolol tartrate (LOPRESSOR) 25 MG tablet Take 1 tablet (25 mg total) by mouth 2 (two) times daily. (Patient taking differently: Take by mouth 2 (two) times daily.) 180 tablet 3   Significant Hospital Events: Including procedures, antibiotic start and stop dates in addition to other pertinent events   1/29: Presented with ICH. Intubated. Propofol. Cleviprex. PCCM consulted for vent management 1/29  Seen by neurosurgery who did not feel that clot evacuation would beneficial. 1/31 On 3% saline, intubated MRI of head-ICH with 14mm left to right shift w/edema and subfalcine herniation   Interim History / Subjective:  Pt intubated/sedated  Unable to move  right arm to painful stimuli  Able to move other extremities to stimuli purposefully   Objective   Blood pressure (!) 155/46, pulse 93, temperature 99.1 F (37.3 C), resp. rate 20, height 5\' 8"  (1.727 m), weight 78.3 kg, SpO2 100%.    Vent Mode: PRVC FiO2 (%):  [40 %] 40 % Set Rate:  [18 bmp-20 bmp] 20 bmp Vt Set:  [510 mL] 510 mL PEEP:  [5 cmH20] 5 cmH20 Plateau Pressure:  [15 cmH20-17 cmH20] 16 cmH20   Intake/Output Summary (Last 24 hours) at 07/14/2023 0824 Last data filed at 07/14/2023 0700 Gross per 24 hour  Intake 3375.31 ml  Output 3441.01 ml  Net -65.7 ml   Filed Weights   07/13/23 0345 07/13/23 0645  Weight: 81.6 kg 78.3 kg    General: critically ill elderly adult female, lying on ICU bed on vent HEENT: normocephalic, teeth intact, pink MM, ETT, OG tube CV: s1,s2, RRR, no JVD, no MRG  Pulm: clear, diminished throughout lung bases, no distress on vent  Abs: BS active, soft  Neuro: RASS -2, right arm unable to move to painful stimuli, all other extremities have purposeful movement to painful stimuli  Extremities: non pitting edema, no deformities  GU: intact, foley    Ancillary Tests Personally Reviewed  Large left frontal hematoma with mass effect on anterior horn of left lateral ventricle.   Assessment & Plan:  Acute Non Traumatic Left Frontal ICH - ICH score 3 MRI 1/31-large left frontal intraparenchymal hematoma with edema 14 mm of left to right midline shift, with some subfalcine herniation  P: Neuro, Stroke Following appreciate recs and assistance  2D echo pended, follow-up  Continue 3% saline, Na goal 150 to 155, obtain every 6hrs  Continue neuroprotective measures: euglycemia, normoxia, normothermia, normocapnia  Continue GOC  Wean sedation as tolerated   Acute Respiratory Failure due to Inability to Protect Airway in the setting of ICH Emphysema (Noted on  CTA head/neck) Pulmonary Nodule RUL, 6 mm, Incidental finding OSA on CPAP P: VAP and PAD  protocol, wean propofol  slowly  WUA, SBT, when appropriate  Continue LTVV 6-8cc/kg  Continue O2 Sat > 92%  HOB > 30, Plt < 30  Obtain intermittent CXR and ABGs Continue GOC discussions  Non-Anion GAP acidosis  Secondary to hyperchloremia in setting of hypertonic saline P: Continue trend electrolytes  Follow and trend anion gap   Hypokalemia P: Replaced with 40 meq of K per tube   Hypertensive Emergency Essential Hypertension Hyperlipidemia CAD On cleviprex  P:  Continue cleviprex, BP goal 130 to 150  Wean gtt as tolerated Hold statin per neuro recs, we start when appropriate   CHF-diastolic Hx of PFO  Echo 07/13/21 EF 60-65%, normal LV and RV function, no regional wall abnormalities  Mild mitral valve regurg, some aortic valve calcification  Grade 1 diastolic function  P:  2D echo pending   Anemia Thrombocytopenia, chronic Plt 73, hgb 9.5  Received 1 unit of plts  P: Continue to follow and monitor for signs of bleeding Transfuse for hgb <7   Dysphagia  -Post stroke  P:  Will need cortrak, appreciate RD recs  Speech consult when appropriate   Daily Goals Checklist   DVT prophylaxis: Start chemical prophylaxis if follow-up scan stable Nutrition Status: at moderate nutritional risk. Will start feeds and Cortrak ordered.  GI prophylaxis: H2  Fluid status goals: allow autoregulation.  Urinary catheter: Foley Central lines: NA Glucose control: sliding scale and tube feed coverage. Mobility/therapy needs: NA Antibiotic de-escalation: NA Home medication reconciliation: holding for now. On a lot of potentially sedating medications.  Daily labs: Bmet Code Status: Full pendent further discussion regarding prognosis.  Family Communication: Family updated at the bedside.  Disposition: ICU.   CC: 40 mins   Christian Debbera Wolken AGACNP-BC   [facility] 07/14/2023, 9:14 AM  Please see [email or web address] for pager details.

## 2023-07-14 NOTE — Progress Notes (Addendum)
STROKE TEAM PROGRESS NOTE   BRIEF HPI [name] is a 83 y.o. female with history of  anemia, CAD, dCHF, DVT, PE not on AC, HTN, HLD, OSA on CPAP, thrombocytopenia who was brought in by EMS as a code stroke due to right-sided weakness and aphasia.  She was hypertensive with systolics up to the 220s in ED.  CT head showed large left frontal IPH with mass effect, edema, 14 mm midline shift.  She required intubation prior to CTA.  NIH on Admission 27  SIGNIFICANT HOSPITAL EVENTS 1/29: CTH large left frontal lobe IPH with mass effect, edema, midline shift of about 14mm.  Intubated for airway protection 1/30: 3% NS bolus, then maintenance at 45ml/hr  MRI stable IPH with 14mm shift and subfalcine herniation, blood seen in multiple ventricles  INTERIM HISTORY/SUBJECTIVE  Son at bedside. RN at bedside. Extensive discussion held at bedside with son.  Palliative care consult ordered for further goals of care discussion.  On exam, patient does not open eyes or follow commands, she does not track, no movement RUE, slight withdraw RLE.    OBJECTIVE  CBC    Component Value Date/Time   WBC 2.9 (L) 07/14/2023 0536   RBC 3.58 (L) 07/14/2023 0536   HGB 9.5 (L) 07/14/2023 0536   HGB 12.6 06/22/2022 1025   HGB 12.3 04/06/2017 1135   HCT 31.3 (L) 07/14/2023 0536   HCT 37.0 04/06/2017 1135   PLT 73 (L) 07/14/2023 0536   PLT 125 (L) 06/22/2022 1025   PLT 190 04/06/2017 1135   MCV 87.4 07/14/2023 0536   MCV 91 04/06/2017 1135   MCH 26.5 07/14/2023 0536   MCHC 30.4 07/14/2023 0536   RDW 20.2 (H) 07/14/2023 0536   RDW 15.6 (H) 04/06/2017 1135   LYMPHSABS 1.0 07/13/2023 0333   LYMPHSABS 1.6 04/06/2017 1135   MONOABS 0.1 07/13/2023 0333   EOSABS 0.1 07/13/2023 0333   EOSABS 0.2 04/06/2017 1135   BASOSABS 0.0 07/13/2023 0333   BASOSABS 0.0 04/06/2017 1135    BMET    Component Value Date/Time   NA 144 07/14/2023 0536   NA 143 03/07/2019 1534   K 3.4 (L) 07/14/2023 0536   CL 114 (H)  07/14/2023 0536   CO2 19 (L) 07/14/2023 0536   GLUCOSE 202 (H) 07/14/2023 0536   BUN 21 07/14/2023 0536   BUN 13 03/07/2019 1534   CREATININE 0.90 07/14/2023 0536   CREATININE 1.01 (H) 06/22/2022 1025   CALCIUM 8.7 (L) 07/14/2023 0536   GFRNONAA >60 07/14/2023 0536   GFRNONAA 55 (L) 06/22/2022 1025    IMAGING past 24 hours MR BRAIN W WO CONTRAST Result Date: 07/13/2023 CLINICAL DATA:  Stroke suspected, right-sided weakness, altered mental status EXAM: MRI HEAD WITHOUT AND WITH CONTRAST TECHNIQUE: Multiplanar, multiecho pulse sequences of the brain and surrounding structures were obtained without and with intravenous contrast. CONTRAST:  8mL GADAVIST GADOBUTROL 1 MMOL/ML IV SOLN COMPARISON:  09/24/2019 MRI head, correlation is made with 07/13/2023 CT head FINDINGS: Brain: Redemonstrated large left frontal intraparenchymal hematoma, with surrounding T2 hyperintense signal, consistent with edema. The hematoma overall measures up to 5.3 x 4.4 x 4.9 cm (AP x TR x CC), likely unchanged when accounting for differences in technique. Up to 14 mm of left-to-right midline shift at the anterior frontal lobes, with some subfalcine herniation. Blood is seen layering the lateral ventricles, third ventricle, and fourth ventricle. In addition to the left frontal hematoma, there is hemosiderin deposition in the right greater than left  periventricular white matter, with additional more punctate foci of hemosiderin deposition in the left thalamus and right posterior frontal lobe. No abnormal parenchymal or meningeal enhancement; no evidence of underlying mass. Additional focus of restricted diffusion with ADC correlate in the medial left temporal lobe (series 2, image 22). No hydrocephalus or extra-axial collection. Pituitary and craniocervical junction within normal limits. Vascular: Normal arterial flow voids. Normal arterial and venous enhancement. Skull and upper cervical spine: Normal marrow signal. Sinuses/Orbits:  Clear paranasal sinuses. No acute finding in the orbits. Status post bilateral lens replacements. Other: Trace fluid in the left mastoid air cells. Fluid in the nasopharynx, secondary to intubation. IMPRESSION: 1. Redemonstrated large left frontal intraparenchymal hematoma, with up to 14 mm of left-to-right midline shift at the anterior frontal lobes, resulting in subfalcine herniation. 2. Additional focus of restricted diffusion with ADC correlate in the medial left temporal lobe, consistent with an acute infarct. 3. Blood is seen layering the lateral ventricles, third ventricle, and fourth ventricle. No definite hydrocephalus. 4. Hemosiderin deposition in the right greater than left periventricular white matter, with additional more punctate foci of hemosiderin deposition in the left thalamus and right posterior frontal lobe, which may be secondary to chronic hypertensive microhemorrhages. Electronically Signed   By: Wiliam Ke M.D.   On: 07/13/2023 12:47    Vitals:   07/14/23 0745 07/14/23 0801 07/14/23 0815 07/14/23 0830  BP: (!) 146/49 (!) 153/60 (!) 150/43 (!) 135/26  Pulse:  (!) 105  (!) 53  Resp: 20 18 20 20   Temp: 99 F (37.2 C) 99 F (37.2 C) 99.1 F (37.3 C) 99.1 F (37.3 C)  TempSrc:      SpO2:  100%  98%  Weight:      Height:         PHYSICAL EXAM General: Critically ill elderly female, intubated Psych:  Mood and affect appropriate for situation CV: Regular rate and rhythm on monitor Respiratory:  Regular, unlabored respirations on ventilator GI: Abdomen soft and nontender   NEURO:   Patient is intubated and sedated.  Does not open eyes to voice, does not follow commands. With forced eye opening, eyes are midline in position.  She does not blink to visual threat bilaterally.  Doll's eyes are present.  She does not Gaffer.PERRL.   Corneal reflex is weak on right, absent on left.  Cough and gag reflex is present.   She is breathing over the vent. Unable to  assess facial symmetry due to ET tube.  Uncooperative with tongue protrusion.   On pain stimulation, patient withdraws on the left arm and leg.  No movement seen on right arm, only slight withdrawal seen on right leg.  There is no Babinski reflex. Sensation, coordination, gait not tested.  Most Recent NIH 27   ASSESSMENT/PLAN  ICH: Large left frontal ICH, etiology:  likely hypertensive  Code Stroke CT head Large (6.1 cm) multifocal/heterogeneous intraparenchymal hemorrhage in the left frontal lobe with 1.4 cm of rightward midline shift CTA head & neck No vascular lesion or proximal occlusion  MRI large left frontal ICH with 14 mm left-to-right midline shift, resulting subfalcine herniation, associate with IVH.  Punctate medial left temporal lobe infarct.  Hemosiderin deposition likely secondary to chronic hypertensive microhemorrhages 2D Echo EF 65 to 70% LDL 63 HgbA1c 5.5 VTE prophylaxis -SCDs aspirin 81 mg daily prior to admission, now on No antithrombotic  due to ICH Therapy recommendations: Pending Disposition: Pending, son expressed patient does not want disabled condition without meaningful  neurological recovery.  Will consult palliative care  Cerebral edema MRI showed 14 mm midline shift with subfalcine herniation On hypertonic saline protocol Received 250 cc bolus on admission Continue 3% at 75->50 cc Na: 143 - 144 - 150-153 Goal Na 150-155 Continue serial sodium levels every 6 hours  Hx of Stroke/TIA June 2010 left-sided weakness status post tPA.  MRI showed acute infarct in the right frontal parietal region, probably affecting the precentral gyrus.  Hypertensive emergency Home meds: Lasix 40 mg, Imdur 30 mg, metoprolol 50 mg, On Cleviprex drip, wean as tolerated Continue PRN labetalol and hydralazine to maintain BP On amlodipine 10 and metoprolol 50 BP goal less than 160 Long-term BP goal normotensive  Hyperlipidemia Home meds: Crestor 10 mg, not resumed in  hospital LDL 63 goal < 70 Hold in the setting of acute bleed Consider continuing statin at discharge  Acute respiratory failure due to ICH Chronic hypoxic respiratory failure Intubated on vent Ventilator mgmt per CCM Appreciate assistance On propofol, sedation per CCM  Dysphagia Patient has post-stroke dysphagia SLP consulted OGT in place Osmolite 1.5 at 45 mL/hour Nutrition on board  Pancytopenia WBC 2.0 - 2.9 Platelets 75- 80- 73, received platelets transfusion 1/29 pm Hemoglobin 11.3--10.5--9.5 Will continue to monitor  Fever Tmax 100.9 UA pending CXR 1/30 low lung volumes with atelectasis and/or consolidation in the left lower lobe with small left pleural effusion  CXR 07/15/2023 pending  Other Stroke Risk Factors Advanced age Former smoker Coronary artery disease PVD Obstructive sleep apnea, on CPAP at home History of provoked PE/DVT, off AC  Other Active Problems AKI, improved-CR 1.13 - 0.7--0.9 Hypokalemia, K 3.4, supplement Anxiety B12 deficiency GERD Neuropathy  Hospital day # 1   Pt seen by Neuro NP/APP and later by MD. Note/plan to be edited by MD as needed.    Lynnae January, DNP, AGACNP-BC [facility] Please use AMION for contact information & EPIC for messaging.  ATTENDING NOTE: I reviewed above note and agree with the assessment and plan. Pt was seen and examined.   Son and RN are at the bedside. Pt is intubated on sedation, eyes closed, not following commands. With forced eye opening, eyes in mid position, not blinking to visual threat, doll's eyes present, not tracking, PERRL. Corneal reflex weakly present on the right but not on the left, gag and cough present. Breathing over the vent.  Facial symmetry not able to test due to ET tube.  Tongue protrusion not cooperative. On pain stimulation, withdraw on the LUE and LLE, no movement on the RUE and slight withdraw on the RLE. no babinski. Sensation, coordination and gait not  tested.  MRI showed stable hematoma with 14 mm midline shift.  On 3% saline, sodium 153, decrease to 50 cc/h.  Still on Cleviprex, put on amlodipine and the metoprolol, taper off Cleviprex as able.  Pancytopenia, stat post platelet transfusion.  Ventilation support per CCM.  Had a fever, check UA and CXR. I had long discussion with son at bedside, updated pt current condition, treatment plan and potential poor prognosis, and answered all the questions.  He expressed understanding and appreciation.  He stated that patient would not want condition risotto me informed neurological recovery.  Will involve palliative care services.  For detailed assessment and plan, please refer to above/below as I have made changes wherever appropriate.   This patient is critically ill due to large ICH, cerebral edema, hypertensive emergency, fever, pancytopenia and at significant risk of neurological worsening, death form hematoma  expansion, brain herniation, sepsis, thrombocytopenia induced bleeding. This patient's care requires constant monitoring of vital signs, hemodynamics, respiratory and cardiac monitoring, review of multiple databases, neurological assessment, discussion with family, other specialists and medical decision making of high complexity. I spent 45 minutes of neurocritical care time in the care of this patient.  Marvel Plan, MD PhD Stroke Neurology 07/14/2023 9:40 PM    To contact Stroke Continuity provider, please refer to [email or web address]. After hours, contact General Neurology

## 2023-07-15 ENCOUNTER — Inpatient Hospital Stay (HOSPITAL_COMMUNITY): Payer: PPO

## 2023-07-15 DIAGNOSIS — E872 Acidosis, unspecified: Secondary | ICD-10-CM | POA: Diagnosis not present

## 2023-07-15 DIAGNOSIS — I611 Nontraumatic intracerebral hemorrhage in hemisphere, cortical: Secondary | ICD-10-CM | POA: Diagnosis not present

## 2023-07-15 DIAGNOSIS — E876 Hypokalemia: Secondary | ICD-10-CM | POA: Diagnosis not present

## 2023-07-15 DIAGNOSIS — J9600 Acute respiratory failure, unspecified whether with hypoxia or hypercapnia: Secondary | ICD-10-CM | POA: Diagnosis not present

## 2023-07-15 LAB — CBC
HCT: 33.8 % — ABNORMAL LOW (ref 36.0–46.0)
Hemoglobin: 9.9 g/dL — ABNORMAL LOW (ref 12.0–15.0)
MCH: 26.6 pg (ref 26.0–34.0)
MCHC: 29.3 g/dL — ABNORMAL LOW (ref 30.0–36.0)
MCV: 90.9 fL (ref 80.0–100.0)
Platelets: 44 10*3/uL — ABNORMAL LOW (ref 150–400)
RBC: 3.72 MIL/uL — ABNORMAL LOW (ref 3.87–5.11)
RDW: 20.9 % — ABNORMAL HIGH (ref 11.5–15.5)
WBC: 2 10*3/uL — ABNORMAL LOW (ref 4.0–10.5)
nRBC: 0 % (ref 0.0–0.2)

## 2023-07-15 LAB — GLUCOSE, CAPILLARY
Glucose-Capillary: 146 mg/dL — ABNORMAL HIGH (ref 70–99)
Glucose-Capillary: 117 mg/dL — ABNORMAL HIGH (ref 70–99)
Glucose-Capillary: 156 mg/dL — ABNORMAL HIGH (ref 70–99)
Glucose-Capillary: 71 mg/dL (ref 70–99)
Glucose-Capillary: 77 mg/dL (ref 70–99)
Glucose-Capillary: 103 mg/dL — ABNORMAL HIGH (ref 70–99)

## 2023-07-15 LAB — SODIUM
Sodium: 157 mmol/L — ABNORMAL HIGH (ref 135–145)
Sodium: 157 mmol/L — ABNORMAL HIGH (ref 135–145)
Sodium: 158 mmol/L — ABNORMAL HIGH (ref 135–145)

## 2023-07-15 LAB — BASIC METABOLIC PANEL
Anion gap: 9 (ref 5–15)
BUN: 26 mg/dL — ABNORMAL HIGH (ref 8–23)
CO2: 19 mmol/L — ABNORMAL LOW (ref 22–32)
Calcium: 9.2 mg/dL (ref 8.9–10.3)
Chloride: 128 mmol/L — ABNORMAL HIGH (ref 98–111)
Creatinine, Ser: 0.78 mg/dL (ref 0.44–1.00)
GFR, Estimated: >60 mL/min (ref >60)
Glucose, Bld: 152 mg/dL — ABNORMAL HIGH (ref 70–99)
Potassium: 4.2 mmol/L (ref 3.5–5.1)
Sodium: 156 mmol/L — ABNORMAL HIGH (ref 135–145)

## 2023-07-15 LAB — URINALYSIS, COMPLETE (UACMP) WITH MICROSCOPIC
Bacteria, UA: NONE SEEN
Bilirubin Urine: NEGATIVE
Glucose, UA: NEGATIVE mg/dL
Hgb urine dipstick: NEGATIVE
Ketones, ur: NEGATIVE mg/dL
Leukocytes,Ua: NEGATIVE
Nitrite: NEGATIVE
Protein, ur: 30 mg/dL — AB
Specific Gravity, Urine: 1.014 (ref 1.005–1.030)
pH: 6 (ref 5.0–8.0)

## 2023-07-15 LAB — PHOSPHORUS: Phosphorus: 2.3 mg/dL — ABNORMAL LOW (ref 2.5–4.6)

## 2023-07-15 MED ORDER — SODIUM CHLORIDE 0.9 % IV SOLN
2.0000 g | INTRAVENOUS | Status: DC
  Administered 2023-07-15: 2 g via INTRAVENOUS
  Filled 2023-07-15: qty 20

## 2023-07-15 MED ORDER — SODIUM CHLORIDE 0.9 % IV SOLN: INTRAVENOUS | Status: DC

## 2023-07-15 NOTE — Progress Notes (Signed)
NAME:  [name], MRN:  [id number], DOB:  [birth year], LOS: 2 ADMISSION DATE:  07/13/2023, CONSULTATION DATE:  07/13/2023 REFERRING MD:  Erick Blinks  CHIEF COMPLAINT:  ICH with respiratory failure   History of Present Illness:  Stephanie Alvarado is an 83 y.o. 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 According to chart review, patient reported a headache, nausea, and vomiting around 1830 1/29. She went to bed at 11 pm, awoke at 3 am with right sided weakness and expressive aphasia. EMS was called, upon arrival to ER SBP 220, NIHSS 27, Head CT large left frontal ICH w/ 14 mm MLS, CTA head/neck states no vascular lesion or occlusion seen underlying ICH, unclear if spot sign, incidental pulmonary nodule (see full report).  She was intubated for airway protection and admitted to the Neuro ICU for further management.   Significant Hospital Events: Including procedures, antibiotic start and stop dates in addition to other pertinent events   1/29: Presented with ICH. Intubated. Propofol. Cleviprex. PCCM consulted for vent management 1/29  Seen by neurosurgery who did not feel that clot evacuation would beneficial. 1/31 On 3% saline, intubated MRI of head-ICH with 14mm left to right shift w/edema and subfalcine herniation  1/31 echocardiogram LVEF 65-70% with normal diastolic function.  Normal RV size and function.  Moderately dilated LA.  No obvious atrial shunt by Doppler  Interim History / Subjective:  No significant interval change  Objective   Blood pressure (!) 148/60, pulse 99, temperature (!) 101.5 F (38.6 C), resp. rate (!) 26, height 5' 7.99" (1.727 m), weight 79.3 kg, SpO2 96%.    Vent Mode: PRVC FiO2 (%):  [40 %] 40 % Set Rate:  [20 bmp] 20 bmp Vt Set:  [510 mL] 510 mL PEEP:  [5 cmH20] 5 cmH20 Plateau Pressure:   [15 cmH20-16 cmH20] 15 cmH20   Intake/Output Summary (Last 24 hours) at 07/15/2023 1132 Last data filed at 07/15/2023 1000 Gross per 24 hour  Intake 2882.26 ml  Output 2475 ml  Net 407.26 ml   Filed Weights   07/13/23 0645 07/14/23 1117 07/15/23 0500  Weight: 78.3 kg 81.1 kg 79.3 kg    General: Thin and critically ill-appearing woman laying in bed ventilated HEENT: ET tube in place, OG tube in place, oropharynx moist, no secretions CV: Regular, no murmur Pulm: Clear bilaterally Abs: Stated with positive bowel sounds Neuro: Does not wake to voice, stimulation, pupils equal, withdraws on the left from pain Extremities: Trace lower extremity edema GU: Full catheter   Ancillary Tests Personally Reviewed  Large left frontal hematoma with mass effect on anterior horn of left lateral ventricle.   Assessment & Plan:  Acute Non Traumatic Left Frontal ICH - ICH score 3 MRI 1/31-large left frontal intraparenchymal hematoma with edema 14 mm of left to right midline shift, with some subfalcine herniation  P: -Appreciate neurology management -Sodium goal 150-155, 3% NaCl held this morning 2/1 -Echocardiogram reassuring -Continue neuroprotective measures -Unfortunately given severity of her ICH, evidence for herniation, prognosis for recovery here is very poor.  Discussion undertaken regard to possible withdrawal of care.  Family wants to discuss with palliative care medicine and consultation is pending.  Acute Respiratory Failure due to Inability to Protect Airway in the setting of ICH Emphysema (Noted on  CTA head/neck) Pulmonary Nodule RUL, 6 mm, Incidental finding  OSA on CPAP P: -PRVC 8 cc/kg -Not in a position to consider SBT or extubation at this time due to neurological status -Pulmonary hygiene -VAP prevention orders in place -Anticipate compassionate extubation as goals of care discussions proceed  Non-Anion GAP acidosis  Secondary to hyperchloremia in setting of hypertonic  saline P: -Follow intermittent BMP  Hypokalemia P: -Following  Hypertensive Emergency Essential Hypertension Hyperlipidemia CAD On cleviprex  P:  -Goal SBP 130-150 -Cleviprex weaned to off  CHF-diastolic Hx of PFO  Echo 07/13/21 EF 60-65%, normal LV and RV function, no regional wall abnormalities  Mild mitral valve regurg, some aortic valve calcification  Grade 1 diastolic function  P:  -Echocardiogram reassuring without any clear evidence for diastolic dysfunction  Anemia Thrombocytopenia, chronic Plt 73, hgb 9.5  Received 1 unit of plts  P: -Follow CBC -No evidence of active bleeding at this time  Dysphagia  -Post stroke  P:  -Core track for TF   Daily Goals Checklist   DVT prophylaxis: SCD Nutrition Status: at moderate nutritional risk.  TF via core track GI prophylaxis: H2  Urinary catheter: Foley Central lines: NA Glucose control: sliding scale and tube feed coverage. Code Status: DNR Family Communication: Patient's boyfriend updated at bedside 2/1 Disposition: ICU.   Independent critical care time 33 minutes  Levy Pupa, MD, PhD 07/15/2023, 11:33 AM [facility] [phone] or if no answer before 7:00PM call [phone] For any issues after 7:00PM please call eLink [phone]

## 2023-07-15 NOTE — Progress Notes (Signed)
OT Cancellation Note  Patient Details Name: Stephanie Alvarado MRN: [id number] DOB: Nov 11, 1940   Cancelled Treatment:    Reason Eval/Treat Not Completed: Patient not medically ready;Other (comment) (Pt not medically ready and pt family considering comfort care. Per RN not appropriate for eval at this time. OT to sign off. Please re-consult if change in status/future needs arise.)  Tyler Deis, OTR/L [facility] Acute Rehabilitation Office: [phone]   Myrla Halsted 07/15/2023, 11:31 AM

## 2023-07-15 NOTE — Progress Notes (Signed)
PT Cancellation Note  Patient Details Name: Stephanie Alvarado MRN: [id number] DOB: 06/17/1940   Cancelled Treatment:    Reason Eval/Treat Not Completed: Medical issues which prohibited therapy;Patient not medically ready.  (Pt not medically ready and pt family considering comfort care. Per RN not appropriate for eval at this time. PT to sign off. Please re-consult if change in status/future needs arise.) 07/15/2023  Jacinto Halim., PT Acute Rehabilitation Services [phone]  (office)    Eliseo Gum Marirose Deveney 07/15/2023, 2:58 PM

## 2023-07-15 NOTE — Progress Notes (Addendum)
STROKE TEAM PROGRESS NOTE   BRIEF HPI [name] is a 83 y.o. female with history of  anemia, CAD, dCHF, DVT, PE not on AC, HTN, HLD, OSA on CPAP, thrombocytopenia who was brought in by EMS as a code stroke due to right-sided weakness and aphasia.  She was hypertensive with systolics up to the 220s in ED.  CT head showed large left frontal IPH with mass effect, edema, 14 mm midline shift.  She required intubation prior to CTA.  NIH on Admission 27  SIGNIFICANT HOSPITAL EVENTS 1/29: CTH large left frontal lobe IPH with mass effect, edema, midline shift of about 14mm.  Intubated for airway protection 1/30: 3% NS bolus, then maintenance at 22ml/hr MRI stable IPH with 14mm shift and subfalcine herniation, blood seen in multiple ventricles  INTERIM HISTORY/SUBJECTIVE  Boyfriend at bedside, updated.   CXR this am shows worsening bibasilar atelectasis/infiltrates. She is having increased fevers. UA negative. Will start ceftriaxone.   This AM Na was 156, stop 3%.   On exam,sluggish doll's eyes, weak corneals, only slight movement to LUE, reflex-type movements BLE, no movement to RUE.   OBJECTIVE  CBC    Component Value Date/Time   WBC 2.0 (L) 07/15/2023 0418   RBC 3.72 (L) 07/15/2023 0418   HGB 9.9 (L) 07/15/2023 0418   HGB 12.6 06/22/2022 1025   HGB 12.3 04/06/2017 1135   HCT 33.8 (L) 07/15/2023 0418   HCT 37.0 04/06/2017 1135   PLT 44 (L) 07/15/2023 0418   PLT 125 (L) 06/22/2022 1025   PLT 190 04/06/2017 1135   MCV 90.9 07/15/2023 0418   MCV 91 04/06/2017 1135   MCH 26.6 07/15/2023 0418   MCHC 29.3 (L) 07/15/2023 0418   RDW 20.9 (H) 07/15/2023 0418   RDW 15.6 (H) 04/06/2017 1135   LYMPHSABS 1.0 07/13/2023 0333   LYMPHSABS 1.6 04/06/2017 1135   MONOABS 0.1 07/13/2023 0333   EOSABS 0.1 07/13/2023 0333   EOSABS 0.2 04/06/2017 1135   BASOSABS 0.0 07/13/2023 0333   BASOSABS 0.0 04/06/2017 1135    BMET    Component Value Date/Time   NA 156 (H) 07/15/2023 0418   NA  143 03/07/2019 1534   K 4.2 07/15/2023 0418   CL 128 (H) 07/15/2023 0418   CO2 19 (L) 07/15/2023 0418   GLUCOSE 152 (H) 07/15/2023 0418   BUN 26 (H) 07/15/2023 0418   BUN 13 03/07/2019 1534   CREATININE 0.78 07/15/2023 0418   CREATININE 1.01 (H) 06/22/2022 1025   CALCIUM 9.2 07/15/2023 0418   GFRNONAA >60 07/15/2023 0418   GFRNONAA 55 (L) 06/22/2022 1025    IMAGING past 24 hours DG CHEST PORT 1 VIEW Result Date: 07/15/2023 CLINICAL DATA:  Fever EXAM: PORTABLE CHEST - 1 VIEW COMPARISON:  07/13/2023 FINDINGS: Endotracheal tube and gastric tube stable. Worsening linear and airspace opacities in both lung bases. Heart size and mediastinal contours are within normal limits. Aortic Atherosclerosis (ICD10-170.0). No effusion. Visualized bones unremarkable. Surgical clips at the thoracic inlet on the left. IMPRESSION: Worsening bibasilar atelectasis or infiltrates. Electronically Signed   By: Corlis Leak M.D.   On: 07/15/2023 08:44   ECHOCARDIOGRAM COMPLETE Result Date: 07/14/2023    ECHOCARDIOGRAM REPORT   Patient Name:   Stephanie Alvarado Date of Exam: 07/14/2023 Medical Rec #:  [id number]     Height:       68.0 in Accession #:    [id number]    Weight:       172.6 lb Date  of Birth:  Oct 07, 1940     BSA:          1.920 m Patient Age:    82 years      BP:           145/40 mmHg Patient Gender: F             HR:           115 bpm. Exam Location:  Inpatient Procedure: 2D Echo, Cardiac Doppler and Color Doppler Indications:    Stroke I63.9  History:        Patient has prior history of Echocardiogram examinations, most                 recent 06/25/2021. CAD, PAD, Mitral Valve Prolapse; Risk                 Factors:Hypertension and Former Smoker.  Sonographer:    Dondra Prader RVT RCS Referring Phys: [id number] DENISE A WOLFE  Sonographer Comments: Elevated HR 110-120 bpm IMPRESSIONS  1. Left ventricular ejection fraction, by estimation, is 65 to 70%. The left ventricle has hyperdynamic function. The left ventricle has no  regional wall motion abnormalities. Left ventricular diastolic parameters were normal.  2. Right ventricular systolic function is normal. The right ventricular size is normal.  3. Left atrial size was moderately dilated.  4. The mitral valve is normal in structure. Trivial mitral valve regurgitation. No evidence of mitral stenosis.  5. The aortic valve is normal in structure. Aortic valve regurgitation is not visualized. No aortic stenosis is present.  6. The inferior vena cava is normal in size with greater than 50% respiratory variability, suggesting right atrial pressure of 3 mmHg. FINDINGS  Left Ventricle: Left ventricular ejection fraction, by estimation, is 65 to 70%. The left ventricle has hyperdynamic function. The left ventricle has no regional wall motion abnormalities. The left ventricular internal cavity size was normal in size. There is no left ventricular hypertrophy. Left ventricular diastolic parameters were normal. Right Ventricle: The right ventricular size is normal. No increase in right ventricular wall thickness. Right ventricular systolic function is normal. Left Atrium: Left atrial size was moderately dilated. Right Atrium: Right atrial size was normal in size. Pericardium: There is no evidence of pericardial effusion. Mitral Valve: The mitral valve is normal in structure. Trivial mitral valve regurgitation. No evidence of mitral valve stenosis. Tricuspid Valve: The tricuspid valve is normal in structure. Tricuspid valve regurgitation is not demonstrated. No evidence of tricuspid stenosis. Aortic Valve: The aortic valve is normal in structure. Aortic valve regurgitation is not visualized. No aortic stenosis is present. Aortic valve mean gradient measures 8.0 mmHg. Aortic valve peak gradient measures 15.0 mmHg. Aortic valve area, by VTI measures 2.36 cm. Pulmonic Valve: The pulmonic valve was normal in structure. Pulmonic valve regurgitation is not visualized. No evidence of pulmonic stenosis.  Aorta: The aortic root is normal in size and structure. Venous: The inferior vena cava is normal in size with greater than 50% respiratory variability, suggesting right atrial pressure of 3 mmHg. IAS/Shunts: No atrial level shunt detected by color flow Doppler.  LEFT VENTRICLE PLAX 2D LVIDd:         5.80 cm   Diastology LVIDs:         3.40 cm   LV e' medial:    10.70 cm/s LV PW:         1.00 cm   LV E/e' medial:  10.1 LV IVS:  0.90 cm   LV e' lateral:   13.50 cm/s LVOT diam:     2.00 cm   LV E/e' lateral: 8.0 LV SV:         69 LV SV Index:   36 LVOT Area:     3.14 cm  RIGHT VENTRICLE             IVC RV Basal diam:  3.60 cm     IVC diam: 2.10 cm RV S prime:     14.10 cm/s TAPSE (M-mode): 2.8 cm LEFT ATRIUM             Index        RIGHT ATRIUM           Index LA diam:        4.00 cm 2.08 cm/m   RA Area:     17.70 cm LA Vol (A2C):   78.0 ml 40.63 ml/m  RA Volume:   46.30 ml  24.12 ml/m LA Vol (A4C):   74.7 ml 38.91 ml/m LA Biplane Vol: 82.2 ml 42.82 ml/m  AORTIC VALVE                     PULMONIC VALVE AV Area (Vmax):    2.64 cm      PV Vmax:       1.61 m/s AV Area (Vmean):   2.54 cm      PV Peak grad:  10.4 mmHg AV Area (VTI):     2.36 cm AV Vmax:           193.33 cm/s AV Vmean:          130.000 cm/s AV VTI:            0.291 m AV Peak Grad:      15.0 mmHg AV Mean Grad:      8.0 mmHg LVOT Vmax:         162.33 cm/s LVOT Vmean:        105.000 cm/s LVOT VTI:          0.218 m LVOT/AV VTI ratio: 0.75  AORTA Ao Root diam: 2.80 cm MITRAL VALVE                TRICUSPID VALVE MV Area (PHT): 5.54 cm     TR Peak grad:   24.4 mmHg MV Decel Time: 137 msec     TR Vmax:        247.00 cm/s MV E velocity: 108.00 cm/s MV A velocity: 117.00 cm/s  SHUNTS MV E/A ratio:  0.92         Systemic VTI:  0.22 m                             Systemic Diam: 2.00 cm Aditya Sabharwal Electronically signed by Dorthula Nettles Signature Date/Time: 07/14/2023/10:06:40 AM    Final     Vitals:   07/15/23 0645 07/15/23 0700 07/15/23 0800  07/15/23 0815  BP: (!) 152/64 (!) 151/63 (!) 159/69   Pulse: (!) 104 (!) 104 (!) 111   Resp: (!) 24 (!) 24 (!) 26   Temp:  (!) 100.9 F (38.3 C) (!) 101.3 F (38.5 C)   TempSrc:  Bladder Bladder   SpO2: 99% 99% 98% 100%  Weight:      Height:         PHYSICAL EXAM General: Critically ill elderly female, intubated Psych:  Mood and affect  appropriate for situation CV: Regular rate and rhythm on monitor Respiratory:  Regular, unlabored respirations on ventilator GI: Abdomen soft and nontender   NEURO:   Patient is intubated and sedated.  Does not open eyes to voice, does not follow commands. With forced eye opening, eyes are midline in position.  She does not blink to visual threat bilaterally.  Doll's eyes are now sluggish  She does not track examiner.PERRL.   Corneal reflex is now weak bilaterally.  Cough and gag reflex is present.   Unable to assess facial symmetry due to ET tube.  Uncooperative with tongue protrusion.   On pain stimulation, patient withdraws on the left arm and leg.  No movement seen on right arm, only slight withdrawal seen on right leg.  There is no Babinski reflex. Sensation, coordination, gait not tested.  Most Recent NIH 27   ASSESSMENT/PLAN  ICH: Large left frontal ICH, etiology:  likely hypertensive in the setting of thrombocytopenia Code Stroke CT head Large (6.1 cm) multifocal/heterogeneous intraparenchymal hemorrhage in the left frontal lobe with 1.4 cm of rightward midline shift CTA head & neck No vascular lesion or proximal occlusion  MRI large left frontal ICH with 14 mm left-to-right midline shift, resulting subfalcine herniation, associate with IVH.  Punctate medial left temporal lobe infarct.  Hemosiderin deposition likely secondary to chronic hypertensive microhemorrhages 2D Echo EF 65 to 70% LDL 63 HgbA1c 5.5 VTE prophylaxis -SCDs aspirin 81 mg daily prior to admission, continue No antithrombotic  due to ICH Therapy recommendations:  Pending Disposition: Pending, son and boyfriend expressed patient does not want disabled condition without meaningful neurological recovery.  Pending palliative consult.   Cerebral edema MRI showed 14 mm midline shift with subfalcine herniation On hypertonic saline protocol Received 250 cc bolus on admission 3% saline @ 75cc->50cc->off->NS @ 20 Na: 143 - 144 - 150-153-154-156 Goal Na 150-155 Continue serial sodium levels every 6 hours   Hx of Stroke/TIA June 2010 left-sided weakness status post tPA.  MRI showed acute infarct in the right frontal parietal region, probably affecting the precentral gyrus.  Hypertensive emergency Home meds: Lasix 40 mg, Imdur 30 mg, metoprolol 50 mg Continue PRN labetalol and hydralazine to maintain BP On amlodipine 10 and metoprolol 50 BP goal less than 160 Long-term BP goal normotensive  Hyperlipidemia Home meds: Crestor 10 mg, not resumed in hospital LDL 63 goal < 70 Hold in the setting of acute bleed Consider continuing statin at discharge  Acute respiratory failure due to ICH Chronic hypoxic respiratory failure Emphysema  Intubated on vent Ventilator mgmt per CCM Appreciate assistance On low-dose propofol, sedation per CCM Start Ceftriaxone  Dysphagia Patient has post-stroke dysphagia SLP consulted OGT in place Osmolite 1.5 at 45 mL/hour Nutrition on board  Pancytopenia WBC 2.0 - 2.9 - 2.0 Platelets 75- 80- 73 - 44, received platelets transfusion 1/29 pm Hemoglobin 11.3--10.5--9.5 - 9.9 Will continue to monitor  Fever ?Aspiration Pneumonia Tmax 101.3->101.7 UA negative CXR 1/30 low lung volumes with atelectasis and/or consolidation in the left lower lobe with small left pleural effusion  CXR 07/15/2023 shows worsening bibasilar atelectasis/infiltrates Start ceftriaxone for empiric coverage  Other Stroke Risk Factors Advanced age Former smoker Coronary artery disease PVD Obstructive sleep apnea, on CPAP at home History of  provoked PE/DVT, off AC  Other Active Problems AKI, improved-CR 1.13 - 0.7--0.9--0.78 Hypokalemia, K 3.4--4.2 Anxiety B12 deficiency GERD Neuropathy  Hospital day # 2   Pt seen by Neuro NP/APP and later by MD. Note/plan to be edited by  MD as needed.    Lynnae January, DNP, AGACNP-BC [facility] Please use AMION for contact information & EPIC for messaging.  ATTENDING NOTE: I reviewed above note and agree with the assessment and plan. Pt was seen and examined.   Boyfriend at bedside.  Patient still intubated, on sedation, slightly open eyes on voice, however not follow commands, eyes midline, pupil bilateral equal size 2mm, sluggish to light.  Doll's eyes sluggish also.  Bilateral corneal reflex weakly present, positive gag and cough.  Left upper extremity purposeful against gravity 3/5, bilateral lower extremity mild withdraw to pain.  Right upper extremity flaccid.  Patient continue to have fever and CXR bilateral worsening infiltrates, on Rocephin.  UA negative.  Sodium 157, DC 3% saline, on normal saline at 20 cc/h.  BP stable, off Cleviprex.  Platelet continue to drop, today 44.  Will not consider platelet transfusion given possible comfort care measures.  Patient son and boyfriend both stated that patient would not want to live situation that has no functional recovery.  Palliative care consulted.  For detailed assessment and plan, please refer to above/below as I have made changes wherever appropriate.   Marvel Plan, MD PhD Stroke Neurology 07/15/2023 5:05 PM  This patient is critically ill due to large ICH, cerebral edema, hypertensive emergency, fever, pancytopenia and at significant risk of neurological worsening, death form hematoma expansion, brain herniation, sepsis, thrombocytopenia induced bleeding. This patient's care requires constant monitoring of vital signs, hemodynamics, respiratory and cardiac monitoring, review of multiple databases, neurological  assessment, discussion with family, other specialists and medical decision making of high complexity. I spent 35 minutes of neurocritical care time in the care of this patient.  I discussed with Dr. Delton Coombes CCM. I had long discussion with boyfriend at bedside, updated pt current condition, treatment plan and potential prognosis, and answered all the questions.  He expressed understanding and appreciation.      To contact Stroke Continuity provider, please refer to [email or web address]. After hours, contact General Neurology

## 2023-07-15 DEATH — deceased

## 2023-07-16 DIAGNOSIS — E872 Acidosis, unspecified: Secondary | ICD-10-CM | POA: Diagnosis not present

## 2023-07-16 DIAGNOSIS — Z7189 Other specified counseling: Secondary | ICD-10-CM

## 2023-07-16 DIAGNOSIS — Z515 Encounter for palliative care: Secondary | ICD-10-CM

## 2023-07-16 DIAGNOSIS — I611 Nontraumatic intracerebral hemorrhage in hemisphere, cortical: Secondary | ICD-10-CM | POA: Diagnosis not present

## 2023-07-16 DIAGNOSIS — J9601 Acute respiratory failure with hypoxia: Secondary | ICD-10-CM

## 2023-07-16 DIAGNOSIS — J9600 Acute respiratory failure, unspecified whether with hypoxia or hypercapnia: Secondary | ICD-10-CM | POA: Diagnosis not present

## 2023-07-16 DIAGNOSIS — E876 Hypokalemia: Secondary | ICD-10-CM | POA: Diagnosis not present

## 2023-07-16 LAB — CBC
HCT: 35 % — ABNORMAL LOW (ref 36.0–46.0)
Hemoglobin: 9.8 g/dL — ABNORMAL LOW (ref 12.0–15.0)
MCH: 26.1 pg (ref 26.0–34.0)
MCHC: 28 g/dL — ABNORMAL LOW (ref 30.0–36.0)
MCV: 93.1 fL (ref 80.0–100.0)
Platelets: 37 10*3/uL — ABNORMAL LOW (ref 150–400)
RBC: 3.76 MIL/uL — ABNORMAL LOW (ref 3.87–5.11)
RDW: 21.1 % — ABNORMAL HIGH (ref 11.5–15.5)
WBC: 2.7 10*3/uL — ABNORMAL LOW (ref 4.0–10.5)
nRBC: 3.3 % — ABNORMAL HIGH (ref 0.0–0.2)

## 2023-07-16 LAB — GLUCOSE, CAPILLARY
Glucose-Capillary: 137 mg/dL — ABNORMAL HIGH (ref 70–99)
Glucose-Capillary: 109 mg/dL — ABNORMAL HIGH (ref 70–99)
Glucose-Capillary: 89 mg/dL (ref 70–99)

## 2023-07-16 LAB — SODIUM
Sodium: 156 mmol/L — ABNORMAL HIGH (ref 135–145)
Sodium: 156 mmol/L — ABNORMAL HIGH (ref 135–145)

## 2023-07-16 MED ORDER — SODIUM CHLORIDE 0.9 % IV SOLN: INTRAVENOUS | Status: DC

## 2023-07-16 MED ORDER — GLYCOPYRROLATE 0.2 MG/ML IJ SOLN: 0.2000 mg | INTRAMUSCULAR | Status: DC | PRN

## 2023-07-16 MED ORDER — GLYCOPYRROLATE 0.2 MG/ML IJ SOLN
0.2000 mg | INTRAMUSCULAR | Status: DC | PRN
  Administered 2023-07-16: 0.2 mg via INTRAVENOUS
  Filled 2023-07-16: qty 1

## 2023-07-16 MED ORDER — GLYCOPYRROLATE 1 MG PO TABS: 1.0000 mg | ORAL_TABLET | ORAL | Status: DC | PRN

## 2023-07-16 MED ORDER — POLYVINYL ALCOHOL 1.4 % OP SOLN: 1.0000 [drp] | Freq: Four times a day (QID) | OPHTHALMIC | Status: DC | PRN

## 2023-07-16 MED ORDER — MORPHINE BOLUS VIA INFUSION: 5.0000 mg | INTRAVENOUS | Status: DC | PRN

## 2023-07-16 MED ORDER — MORPHINE 100MG IN NS 100ML (1MG/ML) PREMIX INFUSION
0.0000 mg/h | INTRAVENOUS | Status: DC
  Administered 2023-07-16: 5 mg/h via INTRAVENOUS
  Filled 2023-07-16: qty 100

## 2023-07-18 ENCOUNTER — Other Ambulatory Visit: Payer: Self-pay

## 2023-07-28 ENCOUNTER — Other Ambulatory Visit: Payer: Self-pay

## 2023-08-12 NOTE — Progress Notes (Signed)
NAME:  Stephanie Alvarado, MRN:  [id number], DOB:  28-Sep-1940, LOS: 3 ADMISSION DATE:  07/13/2023, CONSULTATION DATE:  07/13/2023 REFERRING MD:  Erick Blinks  CHIEF COMPLAINT:  ICH with respiratory failure   History of Present Illness:  Stephanie Alvarado is an 83 y.o. 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 According to chart review, patient reported a headache, nausea, and vomiting around 1830 1/29. She went to bed at 11 pm, awoke at 3 am with right sided weakness and expressive aphasia. EMS was called, upon arrival to ER SBP 220, NIHSS 27, Head CT large left frontal ICH w/ 14 mm MLS, CTA head/neck states no vascular lesion or occlusion seen underlying ICH, unclear if spot sign, incidental pulmonary nodule (see full report).  She was intubated for airway protection and admitted to the Neuro ICU for further management.   Significant Hospital Events: Including procedures, antibiotic start and stop dates in addition to other pertinent events   1/29: Presented with ICH. Intubated. Propofol. Cleviprex. PCCM consulted for vent management 1/29  Seen by neurosurgery who did not feel that clot evacuation would beneficial. 1/31 On 3% saline, intubated MRI of head-ICH with 14mm left to right shift w/edema and subfalcine herniation  1/31 echocardiogram LVEF 65-70% with normal diastolic function.  Normal RV size and function.  Moderately dilated LA.  No obvious atrial shunt by Doppler 2/1 palliative care discussions with family, decision made to withdraw care either 2/2 or 2/3  Interim History / Subjective:  No significant interval change reported  Objective   Blood pressure (!) 137/50, pulse (!) 113, temperature (!) 100.8 F (38.2 C), resp. rate (!) 26, height 5' 7.99" (1.727 m), weight 78.5 kg, SpO2 93%.    Vent Mode: PRVC FiO2 (%):  [40  %] 40 % Set Rate:  [20 bmp] 20 bmp Vt Set:  [510 mL] 510 mL PEEP:  [5 cmH20] 5 cmH20 Plateau Pressure:  [15 cmH20-16 cmH20] 16 cmH20   Intake/Output Summary (Last 24 hours) at 08/07/2023 5621 Last data filed at 08/07/2023 0900 Gross per 24 hour  Intake 1872.25 ml  Output 1300 ml  Net 572.25 ml   Filed Weights   07/14/23 1117 07/15/23 0500 07/27/2023 0417  Weight: 81.1 kg 79.3 kg 78.5 kg    General: Thin, critically ill woman, no distress, ventilated and sedated HEENT: ET tube, OG tube in place, no secretions CV: Regular, distant, no murmur Pulm: Clear bilaterally Abs: Nondistended, positive bowel sounds Neuro: Unresponsive, does withdraw on the left from pain, pupils equal Extremities: Trace lower extremity edema GU: Foley catheter   Ancillary Tests Personally Reviewed  Large left frontal hematoma with mass effect on anterior horn of left lateral ventricle.   Assessment & Plan:  Acute Non Traumatic Left Frontal ICH - ICH score 3 MRI 1/31-large left frontal intraparenchymal hematoma with edema 14 mm of left to right midline shift, with some subfalcine herniation  P: -Appreciate neurology management -Tight blood pressure control, SBP 130-150 -Sodium goal 150-155, 3% saline currently on hold -Continue neuroprotective measures -Given the severity of her ICH, evidence for herniation.  Very poor prognosis for any meaningful neurological recovery.  This has been discussed with family and decision made to withdraw care.  Probably plan for compassionate extubation either on 2/2 or 2/3.  Acute Respiratory Failure due to Inability to Protect Airway in the  setting of ICH Emphysema (Noted on  CTA head/neck) Pulmonary Nodule RUL, 6 mm, Incidental finding OSA on CPAP P: -PRVC 8 cc/kg.  Not in a position for SBP or extubation due to neurological status.  Planning for formal withdrawal of care as above -Sedation, comfort medications as per PAD protocol  Non-Anion GAP acidosis  Secondary to  hyperchloremia in setting of hypertonic saline P: -Hold off on further labs for now  Hypokalemia P: -Hold off on further Lasix for now  Hypertensive Emergency Essential Hypertension Hyperlipidemia CAD On cleviprex  P:  -Goal SBP 130-150, likely liberalize as we progress towards formal withdrawal care -Cleviprex has been weaned off  CHF-diastolic Hx of PFO  Echo 07/13/21 EF 60-65%, normal LV and RV function, no regional wall abnormalities  Mild mitral valve regurg, some aortic valve calcification  Grade 1 diastolic function  P:  -Echocardiogram reassuring without any clear evidence for diastolic dysfunction  Anemia Thrombocytopenia, chronic Plt 73, hgb 9.5  Received 1 unit of plts  P: -No evidence of active bleeding -Hold off on any further labs for now  Dysphagia  -Post stroke  P:  -Tube feeding until ready to withdraw care   Daily Goals Checklist   DVT prophylaxis: SCD Nutrition Status: at moderate nutritional risk.  TF via core track GI prophylaxis: H2  Urinary catheter: Foley Central lines: NA Glucose control: sliding scale and tube feed coverage. Code Status: DNR Family Communication: Patient's boyfriend updated at bedside 2/1 Disposition: ICU.   Independent critical care time N/A  Levy Pupa, MD, PhD 08/01/2023, 9:27 AM [facility] [phone] or if no answer before 7:00PM call [phone] For any issues after 7:00PM please call eLink [phone]

## 2023-08-12 NOTE — Progress Notes (Signed)
   07/24/2023 1626  Spiritual Encounters  Type of Visit Initial  Care provided to: Pt and family  Referral source Code page  Reason for visit End-of-life  OnCall Visit Yes   Chaplain provided spiritual care to family of Pt upon the passing of the Pt. Upon request, the Chaplain prayed for and with the Pt and her family soon after the Pt had died. No further spiritual needs at this time.   Chaplain Daron Offer

## 2023-08-12 NOTE — Progress Notes (Addendum)
STROKE TEAM PROGRESS NOTE   BRIEF HPI [name] is a 83 y.o. female with history of  anemia, CAD, dCHF, DVT, PE not on AC, HTN, HLD, OSA on CPAP, thrombocytopenia who was brought in by EMS as a code stroke due to right-sided weakness and aphasia.  She was hypertensive with systolics up to the 220s in ED.  CT head showed large left frontal IPH with mass effect, edema, 14 mm midline shift.  She required intubation prior to CTA.  NIH on Admission 27  SIGNIFICANT HOSPITAL EVENTS 1/29: CTH large left frontal lobe IPH with mass effect, edema, midline shift of about 14mm.  Intubated for airway protection 1/30: 3% NS bolus, then maintenance at 56ml/hr MRI stable IPH with 14mm shift and subfalcine herniation, blood seen in multiple ventricles  INTERIM HISTORY/SUBJECTIVE  Worsening neuroexam with no corneal reflexes, no blink to threat bilaterally and very weak withdrawal in RLE versus reflux.  Patient has very poor prognosis due to ICH with evidence of herniation and continued worsening neurological exam. Per family discussion with palliative, plan for compassionate extubation and transition to comfort care this afternoon.    OBJECTIVE  CBC    Component Value Date/Time   WBC 2.7 (L) 08/05/2023 0448   RBC 3.76 (L) 07/18/2023 0448   HGB 9.8 (L) 07/27/2023 0448   HGB 12.6 06/22/2022 1025   HGB 12.3 04/06/2017 1135   HCT 35.0 (L) 07/21/2023 0448   HCT 37.0 04/06/2017 1135   PLT 37 (L) 08/05/2023 0448   PLT 125 (L) 06/22/2022 1025   PLT 190 04/06/2017 1135   MCV 93.1 07/30/2023 0448   MCV 91 04/06/2017 1135   MCH 26.1 07/28/2023 0448   MCHC 28.0 (L) 07/28/2023 0448   RDW 21.1 (H) 08/05/2023 0448   RDW 15.6 (H) 04/06/2017 1135   LYMPHSABS 1.0 07/13/2023 0333   LYMPHSABS 1.6 04/06/2017 1135   MONOABS 0.1 07/13/2023 0333   EOSABS 0.1 07/13/2023 0333   EOSABS 0.2 04/06/2017 1135   BASOSABS 0.0 07/13/2023 0333   BASOSABS 0.0 04/06/2017 1135    BMET    Component Value Date/Time    NA 156 (H) 07/29/2023 0955   NA 143 03/07/2019 1534   K 4.2 07/15/2023 0418   CL 128 (H) 07/15/2023 0418   CO2 19 (L) 07/15/2023 0418   GLUCOSE 152 (H) 07/15/2023 0418   BUN 26 (H) 07/15/2023 0418   BUN 13 03/07/2019 1534   CREATININE 0.78 07/15/2023 0418   CREATININE 1.01 (H) 06/22/2022 1025   CALCIUM 9.2 07/15/2023 0418   GFRNONAA >60 07/15/2023 0418   GFRNONAA 55 (L) 06/22/2022 1025    IMAGING past 24 hours No results found.   Vitals:   08/01/2023 0900 07/20/2023 1000 08/03/2023 1005 08/01/2023 1100  BP: (!) 137/50 (!) 119/47  (!) 115/49  Pulse:  93  90  Resp: (!) 26 (!) 27  (!) 29  Temp: (!) 100.8 F (38.2 C) (!) 101.1 F (38.4 C)  (!) 100.9 F (38.3 C)  TempSrc:      SpO2:  95% 96% 93%  Weight:      Height:         PHYSICAL EXAM General: Critically ill elderly female, intubated Psych:  Mood and affect appropriate for situation CV: Regular rate and rhythm on monitor Respiratory:  Regular, unlabored respirations on ventilator GI: Abdomen soft and nontender   NEURO:   Patient is intubated and sedated.  Does not open eyes to voice, does not follow commands. With  forced eye opening, eyes are midline in position.  She does not blink to visual threat bilaterally.  Doll's eyes are now sluggish  She does not track examiner.PERRL is now very sluggish..   Corneal reflex is now absent bilaterally.  Cough and gag reflex now weak. Unable to assess facial symmetry due to ET tube.  Uncooperative with tongue protrusion.   On pain stimulation, patient has very minimal withdraw on left arm.  No movement seen on right arm, only very slight withdrawal seen on bilateral leg.  There is no Babinski reflex. Sensation, coordination, gait not tested.  Most Recent NIH 27   ASSESSMENT/PLAN  ICH: Large left frontal ICH, etiology:  likely hypertensive in the setting of thrombocytopenia Code Stroke CT head Large (6.1 cm) multifocal/heterogeneous intraparenchymal hemorrhage in the left  frontal lobe with 1.4 cm of rightward midline shift CTA head & neck No vascular lesion or proximal occlusion  MRI large left frontal ICH with 14 mm left-to-right midline shift, resulting subfalcine herniation, associate with IVH.  Punctate medial left temporal lobe infarct.  Hemosiderin deposition likely secondary to chronic hypertensive microhemorrhages 2D Echo EF 65 to 70% LDL 63 HgbA1c 5.5 VTE prophylaxis -SCDs aspirin 81 mg daily prior to admission, continue No antithrombotic  due to ICH Therapy recommendations: Pending Disposition: will transition to comfort care this afternoon per family's discussions with palliative care.  Cerebral edema MRI showed 14 mm midline shift with subfalcine herniation On hypertonic saline protocol Received 250 cc bolus on admission 3% saline @ 75cc->50cc->off->NS @ 20 Na: 143 - 144 - 150-153-154-156-156 Goal Na 150-155 Stop serial sodium levels every 6 hours   Hx of Stroke/TIA June 2010 left-sided weakness status post tPA.  MRI showed acute infarct in the right frontal parietal region, probably affecting the precentral gyrus.  Hypertensive emergency Home meds: Lasix 40 mg, Imdur 30 mg, metoprolol 50 mg Continue PRN labetalol and hydralazine to maintain BP On amlodipine 10 and metoprolol 50, will stop when transitioned to comfort care.  BP goal less than 160 Long-term BP goal normotensive  Hyperlipidemia Home meds: Crestor 10 mg, not resumed in hospital LDL 63 goal < 70 Hold in the setting of acute bleed No statin indicated due to transition to comfort care today.   Acute respiratory failure due to ICH Chronic hypoxic respiratory failure Emphysema  Intubated on vent Ventilator mgmt per CCM Appreciate assistance On low-dose propofol, sedation per CCM Continue Ceftriaxone, will stop on transition to comfort care  Dysphagia Patient has post-stroke dysphagia SLP consulted OGT in place Osmolite 1.5 at 45 mL/hour, will stop on transition to  comfort care Nutrition on board  Pancytopenia WBC 2.0 - 2.9 - 2.0 - 2.7 Platelets 75- 80- 73 - 44, - 37 received platelets transfusion 1/29 pm Hemoglobin 11.3--10.5--9.5 - 9.9 - 9.8 Will continue to monitor  Fever ?Aspiration Pneumonia Tmax 101.3->101.7-.>101.1 UA negative CXR 1/30 low lung volumes with atelectasis and/or consolidation in the left lower lobe with small left pleural effusion  CXR 07/15/2023 shows worsening bibasilar atelectasis/infiltrates Continue ceftriaxone for empiric coverage, will stop on transition to comfort care  Other Stroke Risk Factors Advanced age Former smoker Coronary artery disease PVD Obstructive sleep apnea, on CPAP at home History of provoked PE/DVT, off AC  Other Active Problems AKI, improved-CR 1.13 - 0.7--0.9--0.78 Hypokalemia, K 3.4--4.2 Anxiety B12 deficiency GERD Neuropathy  Hospital day # 3   Pt seen by Neuro NP/APP and later by MD. Note/plan to be edited by MD as needed.    Stephanie Alvarado  Sherilyn Banker, DNP, AGACNP-BC [facility] Please use AMION for contact information & EPIC for messaging.  ATTENDING NOTE: I reviewed above note and agree with the assessment and plan. Pt was seen and examined.   No family at bedside.  Patient less responsive than yesterday, not overnight some voids, still intubated on vent.  With forced eye opening, eyes midline, pupil equal size but sluggish to light.  No corneal reflexes.  No gag reflexes.  No movement in all extremities on pain summation.  Patient family had a meeting last night, planning for comfort care sometime later today.  Continue current care without escalation.  Pending family final decision.  For detailed assessment and plan, please refer to above/below as I have made changes wherever appropriate.   Marvel Plan, MD PhD Stroke Neurology 07/25/2023 5:05 PM  This patient is critically ill due to large ICH, cerebral edema, hypertensive emergency, fever, pancytopenia and at significant  risk of neurological worsening, death form hematoma expansion, brain herniation, sepsis, thrombocytopenia induced bleeding. This patient's care requires constant monitoring of vital signs, hemodynamics, respiratory and cardiac monitoring, review of multiple databases, neurological assessment, discussion with family, other specialists and medical decision making of high complexity. I spent 30 minutes of neurocritical care time in the care of this patient.

## 2023-08-12 NOTE — Death Summary Note (Signed)
DEATH SUMMARY   Patient Details  Name: Stephanie Alvarado MRN: [id number] DOB: May 02, 1941  Admission/Discharge Information   Admit Date:  22-Jul-2023  Date of Death: Date of Death: 2023-07-25  Time of Death: Time of Death: 01-Aug-1602  Length of Stay: 3  Referring Physician: [name], MD   Reason(s) for Hospitalization  ICH  with cerebral edema  Diagnoses  Preliminary cause of death:  Secondary Diagnoses (including complications and co-morbidities):  Principal Problem:   ICH (intracerebral hemorrhage) [facility])   Brief Hospital Course (including significant findings, care, treatment, and services provided and events leading to death)  Stephanie Alvarado is a 83 y.o. year old female who history of anemia, CAD, dCHF, DVT, PE not on AC, HTN, HLD, OSA on CPAP, thrombocytopenia who was brought in by EMS as a code stroke due to right-sided weakness and aphasia. She was hypertensive with systolics up to the 220s in ED. CT head showed large left frontal IPH with mass effect, edema, 14 mm midline shift. She required intubation prior to CTA.   ICH: Large left frontal ICH, etiology:  likely hypertensive in the setting of thrombocytopenia Code Stroke CT head Large (6.1 cm) multifocal/heterogeneous intraparenchymal hemorrhage in the left frontal lobe with 1.4 cm of rightward midline shift CTA head & neck No vascular lesion or proximal occlusion  MRI large left frontal ICH with 14 mm left-to-right midline shift, resulting subfalcine herniation, associate with IVH.  Punctate medial left temporal lobe infarct.  Hemosiderin deposition likely secondary to chronic hypertensive microhemorrhages 2D Echo EF 65 to 70% LDL 63 HgbA1c 5.5 aspirin 81 mg daily prior to admission  Family decided comfort care measures on 2/2, patient passed away 1604 on 07/25/2023   Cerebral edema MRI showed 14 mm midline shift with subfalcine herniation Was on hypertonic saline protocol Received 250 cc bolus on admission 3% saline @  75cc->50cc->off Na: 143 - 144 - 150-153-154-156-156   Hx of Stroke/TIA June 2010 left-sided weakness status post tPA.  MRI showed acute infarct in the right frontal parietal region, probably affecting the precentral gyrus.   Hypertensive emergency Home meds: Lasix 40 mg, Imdur 30 mg, metoprolol 50 mg Was on PRN labetalol and hydralazine to maintain BP On amlodipine 10 and metoprolol 50, stopped when transitioned to comfort care.    Hyperlipidemia Home meds: Crestor 10 mg, not resumed in hospital LDL 63 goal < 70   Acute respiratory failure due to ICH Chronic hypoxic respiratory failure Emphysema  Was intubated on vent Was on ceftriaxone, will stop on transition to comfort care Compassionate extubation 2/2   Dysphagia OGT was in place Was on tube feeding with Osmolite 1.5 at 45 mL/hour, stopped on transition to comfort care   Pancytopenia WBC 2.0 - 2.9 - 2.0 - 2.7 Platelets 75- 80- 73 - 44, - 37 received platelets transfusion 1/29 pm Hemoglobin 11.3--10.5--9.5 - 9.9 - 9.8   Fever ?Aspiration Pneumonia Tmax 101.3->101.7-.>101.1 UA negative CXR 2023/07/22 low lung volumes with atelectasis and/or consolidation in the left lower lobe with small left pleural effusion  CXR 07/15/2023 shows worsening bibasilar atelectasis/infiltrates Will ceftriaxone for empiric coverage, stopped on transition to comfort care   Other Stroke Risk Factors Advanced age Former smoker Coronary artery disease PVD Obstructive sleep apnea, on CPAP at home History of provoked PE/DVT, off AC   Other medical problems AKI, improved-CR 1.13 - 0.7--0.9--0.78 Hypokalemia, K 3.4--4.2 Anxiety B12 deficiency GERD Neuropathy    Pertinent Labs and Studies  Significant Diagnostic Studies DG CHEST PORT 1  VIEW Result Date: 07/15/2023 CLINICAL DATA:  Fever EXAM: PORTABLE CHEST - 1 VIEW COMPARISON:  07/13/2023 FINDINGS: Endotracheal tube and gastric tube stable. Worsening linear and airspace opacities in both lung  bases. Heart size and mediastinal contours are within normal limits. Aortic Atherosclerosis (ICD10-170.0). No effusion. Visualized bones unremarkable. Surgical clips at the thoracic inlet on the left. IMPRESSION: Worsening bibasilar atelectasis or infiltrates. Electronically Signed   By: Corlis Leak M.D.   On: 07/15/2023 08:44   ECHOCARDIOGRAM COMPLETE Result Date: 07/14/2023    ECHOCARDIOGRAM REPORT   Patient Name:   Stephanie Alvarado Date of Exam: 07/14/2023 Medical Rec #:  [id number]     Height:       68.0 in Accession #:    [id number]    Weight:       172.6 lb Date of Birth:  [birth year]     BSA:          1.920 m Patient Age:    82 years      BP:           145/40 mmHg Patient Gender: F             HR:           115 bpm. Exam Location:  Inpatient Procedure: 2D Echo, Cardiac Doppler and Color Doppler Indications:    Stroke I63.9  History:        Patient has prior history of Echocardiogram examinations, most                 recent 06/25/2021. CAD, PAD, Mitral Valve Prolapse; Risk                 Factors:Hypertension and Former Smoker.  Sonographer:    Dondra Prader RVT RCS Referring Phys: [phone] DENISE A WOLFE  Sonographer Comments: Elevated HR 110-120 bpm IMPRESSIONS  1. Left ventricular ejection fraction, by estimation, is 65 to 70%. The left ventricle has hyperdynamic function. The left ventricle has no regional wall motion abnormalities. Left ventricular diastolic parameters were normal.  2. Right ventricular systolic function is normal. The right ventricular size is normal.  3. Left atrial size was moderately dilated.  4. The mitral valve is normal in structure. Trivial mitral valve regurgitation. No evidence of mitral stenosis.  5. The aortic valve is normal in structure. Aortic valve regurgitation is not visualized. No aortic stenosis is present.  6. The inferior vena cava is normal in size with greater than 50% respiratory variability, suggesting right atrial pressure of 3 mmHg. FINDINGS  Left Ventricle: Left  ventricular ejection fraction, by estimation, is 65 to 70%. The left ventricle has hyperdynamic function. The left ventricle has no regional wall motion abnormalities. The left ventricular internal cavity size was normal in size. There is no left ventricular hypertrophy. Left ventricular diastolic parameters were normal. Right Ventricle: The right ventricular size is normal. No increase in right ventricular wall thickness. Right ventricular systolic function is normal. Left Atrium: Left atrial size was moderately dilated. Right Atrium: Right atrial size was normal in size. Pericardium: There is no evidence of pericardial effusion. Mitral Valve: The mitral valve is normal in structure. Trivial mitral valve regurgitation. No evidence of mitral valve stenosis. Tricuspid Valve: The tricuspid valve is normal in structure. Tricuspid valve regurgitation is not demonstrated. No evidence of tricuspid stenosis. Aortic Valve: The aortic valve is normal in structure. Aortic valve regurgitation is not visualized. No aortic stenosis is present. Aortic valve mean gradient measures 8.0 mmHg. Aortic valve peak  gradient measures 15.0 mmHg. Aortic valve area, by VTI measures 2.36 cm. Pulmonic Valve: The pulmonic valve was normal in structure. Pulmonic valve regurgitation is not visualized. No evidence of pulmonic stenosis. Aorta: The aortic root is normal in size and structure. Venous: The inferior vena cava is normal in size with greater than 50% respiratory variability, suggesting right atrial pressure of 3 mmHg. IAS/Shunts: No atrial level shunt detected by color flow Doppler.  LEFT VENTRICLE PLAX 2D LVIDd:         5.80 cm   Diastology LVIDs:         3.40 cm   LV e' medial:    10.70 cm/s LV PW:         1.00 cm   LV E/e' medial:  10.1 LV IVS:        0.90 cm   LV e' lateral:   13.50 cm/s LVOT diam:     2.00 cm   LV E/e' lateral: 8.0 LV SV:         69 LV SV Index:   36 LVOT Area:     3.14 cm  RIGHT VENTRICLE             IVC RV Basal  diam:  3.60 cm     IVC diam: 2.10 cm RV S prime:     14.10 cm/s TAPSE (M-mode): 2.8 cm LEFT ATRIUM             Index        RIGHT ATRIUM           Index LA diam:        4.00 cm 2.08 cm/m   RA Area:     17.70 cm LA Vol (A2C):   78.0 ml 40.63 ml/m  RA Volume:   46.30 ml  24.12 ml/m LA Vol (A4C):   74.7 ml 38.91 ml/m LA Biplane Vol: 82.2 ml 42.82 ml/m  AORTIC VALVE                     PULMONIC VALVE AV Area (Vmax):    2.64 cm      PV Vmax:       1.61 m/s AV Area (Vmean):   2.54 cm      PV Peak grad:  10.4 mmHg AV Area (VTI):     2.36 cm AV Vmax:           193.33 cm/s AV Vmean:          130.000 cm/s AV VTI:            0.291 m AV Peak Grad:      15.0 mmHg AV Mean Grad:      8.0 mmHg LVOT Vmax:         162.33 cm/s LVOT Vmean:        105.000 cm/s LVOT VTI:          0.218 m LVOT/AV VTI ratio: 0.75  AORTA Ao Root diam: 2.80 cm MITRAL VALVE                TRICUSPID VALVE MV Area (PHT): 5.54 cm     TR Peak grad:   24.4 mmHg MV Decel Time: 137 msec     TR Vmax:        247.00 cm/s MV E velocity: 108.00 cm/s MV A velocity: 117.00 cm/s  SHUNTS MV E/A ratio:  0.92         Systemic VTI:  0.22 m  Systemic Diam: 2.00 cm Aditya Sabharwal Electronically signed by Dorthula Nettles Signature Date/Time: 07/14/2023/10:06:40 AM    Final    MR BRAIN W WO CONTRAST Result Date: 07/13/2023 CLINICAL DATA:  Stroke suspected, right-sided weakness, altered mental status EXAM: MRI HEAD WITHOUT AND WITH CONTRAST TECHNIQUE: Multiplanar, multiecho pulse sequences of the brain and surrounding structures were obtained without and with intravenous contrast. CONTRAST:  8mL GADAVIST GADOBUTROL 1 MMOL/ML IV SOLN COMPARISON:  09/24/2019 MRI head, correlation is made with 07/13/2023 CT head FINDINGS: Brain: Redemonstrated large left frontal intraparenchymal hematoma, with surrounding T2 hyperintense signal, consistent with edema. The hematoma overall measures up to 5.3 x 4.4 x 4.9 cm (AP x TR x CC), likely unchanged when  accounting for differences in technique. Up to 14 mm of left-to-right midline shift at the anterior frontal lobes, with some subfalcine herniation. Blood is seen layering the lateral ventricles, third ventricle, and fourth ventricle. In addition to the left frontal hematoma, there is hemosiderin deposition in the right greater than left periventricular white matter, with additional more punctate foci of hemosiderin deposition in the left thalamus and right posterior frontal lobe. No abnormal parenchymal or meningeal enhancement; no evidence of underlying mass. Additional focus of restricted diffusion with ADC correlate in the medial left temporal lobe (series 2, image 22). No hydrocephalus or extra-axial collection. Pituitary and craniocervical junction within normal limits. Vascular: Normal arterial flow voids. Normal arterial and venous enhancement. Skull and upper cervical spine: Normal marrow signal. Sinuses/Orbits: Clear paranasal sinuses. No acute finding in the orbits. Status post bilateral lens replacements. Other: Trace fluid in the left mastoid air cells. Fluid in the nasopharynx, secondary to intubation. IMPRESSION: 1. Redemonstrated large left frontal intraparenchymal hematoma, with up to 14 mm of left-to-right midline shift at the anterior frontal lobes, resulting in subfalcine herniation. 2. Additional focus of restricted diffusion with ADC correlate in the medial left temporal lobe, consistent with an acute infarct. 3. Blood is seen layering the lateral ventricles, third ventricle, and fourth ventricle. No definite hydrocephalus. 4. Hemosiderin deposition in the right greater than left periventricular white matter, with additional more punctate foci of hemosiderin deposition in the left thalamus and right posterior frontal lobe, which may be secondary to chronic hypertensive microhemorrhages. Electronically Signed   By: Wiliam Ke M.D.   On: 07/13/2023 12:47   DG Chest Portable 1 View Result  Date: 07/13/2023 CLINICAL DATA:  83 year old female with history of intubation. EXAM: PORTABLE CHEST 1 VIEW COMPARISON:  Chest x-ray 05/28/2023. FINDINGS: An endotracheal tube is in place with tip 5.5 cm above the carina. A nasogastric tube is seen extending into the stomach, however, the tip of the nasogastric tube extends below the lower margin of the image. Lung volumes are low. Opacity at the left base which may reflect atelectasis and/or consolidation, with superimposed small left pleural effusion. Probable subsegmental atelectasis also noted in the medial aspect of the right lung base. No pneumothorax. No evidence of pulmonary edema. Heart size is mildly enlarged. Upper mediastinal contours are within normal limits. Atherosclerotic calcifications are noted in the thoracic aorta. IMPRESSION: 1. Support apparatus, as above. 2. Low lung volumes with atelectasis and/or consolidation in the left lower lobe with small left pleural effusion. 3. Mild cardiomegaly. 4. Aortic atherosclerosis. Electronically Signed   By: Trudie Reed M.D.   On: 07/13/2023 05:32   CT ANGIO HEAD NECK W WO CM (CODE STROKE) Result Date: 07/13/2023 CLINICAL DATA:  Right-sided weakness with altered mental status EXAM: CT ANGIOGRAPHY HEAD AND NECK  WITH AND WITHOUT CONTRAST TECHNIQUE: Multidetector CT imaging of the head and neck was performed using the standard protocol during bolus administration of intravenous contrast. Multiplanar CT image reconstructions and MIPs were obtained to evaluate the vascular anatomy. Carotid stenosis measurements (when applicable) are obtained utilizing NASCET criteria, using the distal internal carotid diameter as the denominator. RADIATION DOSE REDUCTION: This exam was performed according to the departmental dose-optimization program which includes automated exposure control, adjustment of the mA and/or kV according to patient size and/or use of iterative reconstruction technique. CONTRAST:  75mL  OMNIPAQUE IOHEXOL 350 MG/ML SOLN COMPARISON:  Head CT from earlier today FINDINGS: CTA NECK FINDINGS Aortic arch: Atheromatous calcification with 3 vessel branching Right carotid system: Bulky calcified plaque at the proximal brachiocephalic, stenosis measuring 70% on coronal reformats. There is diffuse common carotid and proximal ICA calcified plaque without flow reducing stenosis or ulceration. Proximal ICA narrowing measures 40% on coronal reformats. Left carotid system: Calcified plaque scattered along the common carotid and proximal ICA without ulceration or flow reducing stenosis of the ICA. Calcified plaque causes high-grade narrowing at the origin of the ECA. 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 The vertebral arteries are widely patent and smoothly contoured to the dura on both sides. Calcified plaque at the right vertebral origin causes mild stenosis. Skeleton: No acute finding Other neck: 2.4 cm right thyroid nodule with heterogeneous enhancement, stable by chest CT since at least 2017. No follow-up recommended unless clinically warranted (ref: J Am Coll Radiol. 2015 Feb;12(2): 143-50). Unremarkable hardware in the trachea and esophagus where covered in the neck. Upper chest: Emphysema. 6 mm nodule in the right upper lobe on 6:11. Review of the MIP images confirms the above findings CTA HEAD FINDINGS Anterior circulation: Diffuse atheromatous calcification of the cavernous carotids. No branch occlusion, beading, aneurysm, or evidence of vascular malformation. There is a punctate density at the posterior aspect of the hematoma marked on coronal reformats. Posterior circulation: The vertebral and basilar arteries are widely patent. Atheromatous irregularity of the posterior cerebral arteries without proximal flow reducing stenosis. No aneurysm or vascular malformation. Venous sinuses: No early enhancement. Anatomic variants: None  significant Review of the MIP images confirms the above findings IMPRESSION: 1. No vascular lesion or proximal occlusion seen underlying the patient's left frontal ICH. Equivocal for spot sign at the posterior hematoma. 2. Generalized atherosclerosis. 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 3. Over 50% stenosis at the brachiocephalic artery due to bulky calcified plaque. 4. Emphysema and 6 mm pulmonary nodule in the right upper lobe. Non-contrast chest CT at 6-12 months is recommended. If the nodule is stable at time of repeat CT, then future CT at 18-24 months (from today's scan) is considered optional for low-risk patients, but is recommended for high-risk patients. This recommendation follows the consensus statement: Guidelines for Management of Incidental Pulmonary Nodules Detected on CT Images: From the Fleischner Society 2017; Radiology 2017; 284:228-243. Electronically Signed   By: Tiburcio Pea M.D.   On: 07/13/2023 05:19   CT HEAD CODE STROKE WO CONTRAST Result Date: 07/13/2023 CLINICAL DATA:  Code stroke.  Neuro deficit, acute, stroke suspected EXAM: CT HEAD WITHOUT CONTRAST TECHNIQUE: Contiguous axial images were obtained from the base of the skull through the vertex without intravenous contrast. RADIATION DOSE REDUCTION: This exam was performed according to the departmental dose-optimization program which includes automated exposure control, adjustment of the mA and/or kV according to patient  size and/or use of iterative reconstruction technique. COMPARISON:  None Available. FINDINGS: Brain: Large multifocal acute intraparenchymal hemorrhages in the left frontal lobe, measuring up to approximately 6.1 x 5.8 x 5.0 cm. Surrounding edema and mass effect with approximately 1.4 cm of rightward midline shift. There is loss of gray-white differentiation surrounding the hemorrhage. No hydrocephalus. Moderate to advanced patchy white matter  hypodensities, nonspecific but compatible with chronic microvascular ischemic disease. Vascular: No hyperdense vessel identified. Skull: No acute fracture. Sinuses/Orbits: Clear sinuses.  No acute orbital findings. Other: No mastoid effusions. IMPRESSION: 1. Large (6.1 cm) multifocal/heterogeneous intraparenchymal hemorrhage in the left frontal lobe with 1.4 cm of rightward midline shift. Given loss of grey-white differentiation surrounding the hemorrhage, hemorrhagic conversion of an infarct is a consideration. Follow-up MRI could further evaluate. 2. Small volume of overlying extra-axial extension. Code stroke imaging results were communicated on 07/13/2023 at 3:53 AM to provider Dr. Derry Lory Via telephone, who verbally acknowledged these results. Electronically Signed   By: Feliberto Harts M.D.   On: 07/13/2023 04:01    Microbiology Recent Results (from the past 240 hours)  MRSA Next Gen by PCR, Nasal     Status: None   Collection Time: 07/13/23  7:43 AM   Specimen: Nasal Mucosa; Nasal Swab  Result Value Ref Range Status   MRSA by PCR Next Gen NOT DETECTED NOT DETECTED Final    Comment: (NOTE) The GeneXpert MRSA Assay (FDA approved for NASAL specimens only), is one component of a comprehensive MRSA colonization surveillance program. It is not intended to diagnose MRSA infection nor to guide or monitor treatment for MRSA infections. Test performance is not FDA approved in patients less than 61 years old. Performed at [facility], [address], [place], [place] [address]     Lab Basic Metabolic Panel: Recent Labs  Lab 07/13/23 [phone] 07/13/23 0335 07/13/23 0447 07/13/23 0948 07/13/23 1634 07/13/23 2330 07/14/23 0536 07/14/23 1033 07/14/23 1629 07/14/23 2159 07/15/23 0418 07/15/23 0930 07/15/23 1626 07/15/23 2257 08/05/2023 0448 08/11/2023 0955  NA 137 138 137 143 143   < > 144   < > 153*   < > 156* 157* 157* 158* 156* 156*  K 3.7 3.7 3.4*  --   --   --  3.4*  --   --    --  4.2  --   --   --   --   --   CL 101 104  --   --   --   --  114*  --   --   --  128*  --   --   --   --   --   CO2 23  --   --   --   --   --  19*  --   --   --  19*  --   --   --   --   --   GLUCOSE 160* 155*  --   --   --   --  202*  --   --   --  152*  --   --   --   --   --   BUN 17 18  --   --   --   --  21  --   --   --  26*  --   --   --   --   --   CREATININE 1.13* 1.10*  --   --   --   --  0.90  --   --   --  0.78  --   --   --   --   --   CALCIUM 9.8  --   --   --   --   --  8.7*  --   --   --  9.2  --   --   --   --   --   MG  --   --   --  1.8 2.3  --  2.4  --  2.4  --   --   --   --   --   --   --   PHOS  --   --   --  2.2* 2.7  --  2.6  --  1.9*  --   --  2.3*  --   --   --   --    < > = values in this interval not displayed.   Liver Function Tests: Recent Labs  Lab 07/13/23 0333  AST 21  ALT 10  ALKPHOS 84  BILITOT 1.0  PROT 6.8  ALBUMIN 3.9   No results for input(s): "LIPASE", "AMYLASE" in the last 168 hours. No results for input(s): "AMMONIA" in the last 168 hours. CBC: Recent Labs  Lab 07/13/23 0333 07/13/23 0335 07/13/23 0447 07/13/23 0752 07/14/23 0536 07/15/23 0418 07/17/2023 0448  WBC 2.0*  --   --   --  2.9* 2.0* 2.7*  NEUTROABS 0.7*  --   --   --   --   --   --   HGB 11.3* 11.6* 10.5*  --  9.5* 9.9* 9.8*  HCT 36.9 34.0* 31.0*  --  31.3* 33.8* 35.0*  MCV 87.2  --   --   --  87.4 90.9 93.1  PLT 75*  --   --  80* 73* 44* 37*   Cardiac Enzymes: No results for input(s): "CKTOTAL", "CKMB", "CKMBINDEX", "TROPONINI" in the last 168 hours. Sepsis Labs: Recent Labs  Lab 07/13/23 0333 07/14/23 0536 07/15/23 0418 08/02/2023 0448  WBC 2.0* 2.9* 2.0* 2.7*    Procedures/Operations  Intubation and extubation   Marvel Plan 08/09/2023, 5:07 PM

## 2023-08-12 NOTE — Progress Notes (Signed)
Pt time of death 1604. Pronounced by Elicia Lamp, Johnna Acosta. Family at bedside. Dr. Roda Shutters and Dr. Delton Coombes made aware.

## 2023-08-12 NOTE — Procedures (Signed)
Extubation Procedure Note  Patient Details:   Name: Stephanie Alvarado DOB: 1940-10-03 MRN: [id number]   Airway Documentation:    Vent end date: 07/19/2023 Vent end time: 1538   Evaluation  O2 sats: stable throughout Complications: No apparent complications Patient did tolerate procedure well. Bilateral Breath Sounds: Diminished   Pt extubated to comfort care per order with RN and family at bedside.  Lajuan Lines 07/18/2023, 3:38 PM

## 2023-08-12 NOTE — Consult Note (Incomplete)
Palliative Care Consult Note                                  Date: 07/24/2023   Patient Name: Stephanie Alvarado  DOB: [birth year]  MRN: [id number]  Age / Sex: 83 y.o., female  PCP: [name], MD Referring Physician: Stroke, Md, MD  Reason for Consultation: Establishing goals of care  HPI/Patient Profile: 83 y.o. female  with past medical history of *** admitted on 07/13/2023 with ***.   Past Medical History:  Diagnosis Date   Achilles tendon rupture, right, initial encounter    Allergic rhinitis, cause unspecified    Anemia, unspecified    Anxiety state, unspecified    Atherosclerosis of aorta (HCC)    TEE, June, 2010, grade 4 aortic arch atherosclerosis , Dr. Shirlee Latch   CAD (coronary artery disease)    DES and circumflex 2006 /  DES to LAD 2011   Carotid artery disease (HCC)    Doppler, November, 2011, stable, 0-39% bilateral, turbulent flow left subclavian   Chronic diastolic heart failure (HCC)    Cyst of thyroid    Diverticulosis of colon (without mention of hemorrhage)    DVT (deep venous thrombosis) (HCC)    in leg   Dysphasia    Possibly esophageal stricture   Esophageal reflux    Hiatal hernia    Hyperlipemia    Hypertension    Lumbago    Lumbar disc disease    MVP (mitral valve prolapse)    Neuropathy    Nonspecific abnormal finding in stool contents    Obesity, unspecified    Osteoarthrosis, unspecified whether generalized or localized, unspecified site    Osteopenia    Other diseases of lung, not elsewhere classified    PE (pulmonary thromboembolism) (HCC)    both lungs   Peripheral vascular disease, unspecified (HCC)    Personal history of colonic polyps    ADENOMATOUS POLYP   PFO (patent foramen ovale)    Patient had a very small PFO by TEE 2010.  This was seen only by bubble analysis during Valsalva   PONV (postoperative nausea and vomiting)    Sleep apnea    uses CPAP nightly   Sleep related  hypoventilation/hypoxemia in conditions classifiable elsewhere    Stroke [facility]) 11/2008   Treated with TPA? /     2010, TEE no left atrial clot, probably from atherosclerosis of the aortic arch   Subclavian artery disease (HCC)    Remote surgery by Dr. Betti Cruz.   Unspecified cerebral artery occlusion with cerebral infarction    Unspecified venous (peripheral) insufficiency    Vitamin B 12 deficiency     Subjective:   I have reviewed medical records including EPIC notes, labs and imaging, received report from the team, and assessed the patient at bedside.   I met with *** to discuss diagnosis, prognosis, GOC, EOL wishes, disposition, and options.  I introduced Palliative Medicine as specialized medical care for people living with serious illness. It focuses on providing relief from the symptoms and stress of a serious illness.   We discussed patient's current illness and what it  means in the larger context of his/her ongoing co-morbidities. Current clinical status was reviewed. Natural disease trajectory of *** was discussed.  Created space and opportunity for patient and family to explore thoughts and feelings regarding current medical situation.  Values and goals of care important to patient and family were attempted to be elicited.  A discussion was had today regarding advanced directives. Concepts specific to code status, artifical feeding and hydration, continued IV antibiotics and rehospitalization was had.  The MOST form was introduced and discussed.  Questions and concerns addressed. Patient/family encouraged to call with questions or concerns.     Life Review: ***  Functional Status: ***  Patient/Family Understanding of Illness: ***  Patient Values: ***  Goals: ***  Additional Discussion: ***  Review of Systems  Objective:   Primary Diagnoses: Present on Admission:  ICH (intracerebral hemorrhage) (HCC)   Physical Exam  Vital Signs:  BP (!) 115/49    Pulse 90   Temp (!) 100.9 F (38.3 C)   Resp (!) 29   Ht 5' 7.99" (1.727 m)   Wt 78.5 kg   SpO2 93%   BMI 26.32 kg/m   Palliative Assessment/Data: ***     Assessment & Plan:   SUMMARY OF RECOMMENDATIONS   ***  Primary Decision Maker: {Primary Decision EAVWU:98119}  Code Status/Advance Care Planning: {Palliative Code status:23503}  Symptom Management:  ***  Prognosis:  {Palliative Care Prognosis:23504}  Discharge Planning:  {Palliative dispostion:23505}   Discussed with: ***    Thank you for allowing [place] to participate in the care of Cottie Banda   Time Total: ***  Greater than 50%  of this time was spent counseling and coordinating care related to the above assessment and plan.  Signed by: Sherlean Foot, NP Palliative Medicine Team  Team Phone # [phone]  For individual providers, please see AMION

## 2023-08-12 DEATH — deceased

## 2023-09-12 NOTE — Consult Note (Signed)
 Palliative Care Consult Note                                  Date: 08/17/2023   Patient Name: Stephanie Alvarado  DOB: 15-Mar-1941  MRN: [id number]  Age / Sex: 82 y.o., female  PCP: Stephanie Chimera, MD Referring Physician: No att. providers found  Reason for Consultation: Establishing goals of care  HPI/Patient Profile: 83 y.o. female  with past medical history of PAD, chronic CHF, PE/DVT not on anticoagulation, OSA on CPAP, HTN, HLD, and anemia who was admitted on 07/13/2023 as a code stroke due to right-sided weakness and aphasia. CT head showed large left frontal IPH with mass effect, edema, and midline shift. She was intubated for airway protection.   Palliative Medicine was consulted for goals of care.   Subjective:   Extensive chart review has been completed including labs, vital signs, imaging, progress/consult notes, orders, medications and available advance directive documents.   I assessed patient at bedside. Update received from RN.   I spoke with son/Stephanie by phone to discuss diagnosis, prognosis, GOC, and EOL wishes.  I introduced Palliative Medicine as specialized medical care for people living with serious illness. It focuses on providing relief from the symptoms and stress of a serious illness.   We discussed patient's current illness and what it means in the larger context of her ongoing co-morbidities. Current clinical status was reviewed. Discussed the natural trajectory of a sudden, severe neurological injury such as IPH.  Created space and opportunity for son to express thoughts and feelings regarding current medical situation. Values and goals of care were attempted to be elicited.  The difference between full scope medical intervention and comfort care was considered. We reviewed the concept of comfort care, emphasizing that this path involves de-escalating full scope medical interventions and  allowing a natural course to  occur. Discussed that the goal is comfort and dignity rather than prolonging life.   Son confirms desire to transition to comfort care later today when family arrives. We discussed the process of liberalization from the ventilator, including indication for opioids and other medications to ensure comfort at end-of-life. We discussed discontinuing unnecessary interventions including labs, CBG monitoring, and artificial feeding.   Questions and concerns addressed. Emotional support provided.    Review of Systems  Unable to perform ROS   Objective:   Primary Diagnoses: Present on Admission:  ICH (intracerebral hemorrhage) (HCC)   Physical Exam Vitals reviewed.  Constitutional:      General: She is not in acute distress.    Appearance: She is ill-appearing.     Interventions: She is sedated.  Pulmonary:     Comments: intubated Neurological:     Comments: Not following commands    Palliative Assessment/Data: PPS 30%     Assessment & Plan:   SUMMARY OF RECOMMENDATIONS   Transition to comfort measures Start morphine infusion for comfort Continue propofol Discontinue tube feeding, labs, CBG monitoring Extubate to room air when family is ready PMT will continue to follow  Primary Decision Maker: NEXT OF KIN - son/Stephanie Alvarado  Code Status/Advance Care Planning: DNR  Symptom Management:  Lorazepam (ATIVAN) prn for sedation or seizure Haloperidol (HALDOL) prn for agitation  Glycopyrrolate (ROBINUL) for excessive secretions Ondansetron (ZOFRAN) prn for nausea Polyvinyl alcohol (LIQUIFILM TEARS) prn for dry eyes Antiseptic oral rinse (BIOTENE) prn for dry mouth   Prognosis:  Hours - Days  Discharge  Planning:  Anticipated Hospital Death     Thank you for allowing [place] to participate in the care of Stephanie Alvarado   Time Total: 55 minutes  Detailed review of medical records (labs, imaging, vital signs), medically appropriate exam, discussed with treatment team,  counseling and education to family, & staff, documenting clinical information, medication management, coordination of care.   Signed by: Sherlean Foot, NP Palliative Medicine Team  Team Phone # [phone]  For individual providers, please see AMION

## 2023-12-24 IMAGING — DX DG CHEST 1V PORT
1 series · 1 of 1 positions shown · non-contrast
Comparison: 07/11/2021

CLINICAL DATA: hypoxia

EXAM:
PORTABLE CHEST - 1 VIEW

[chest ap]
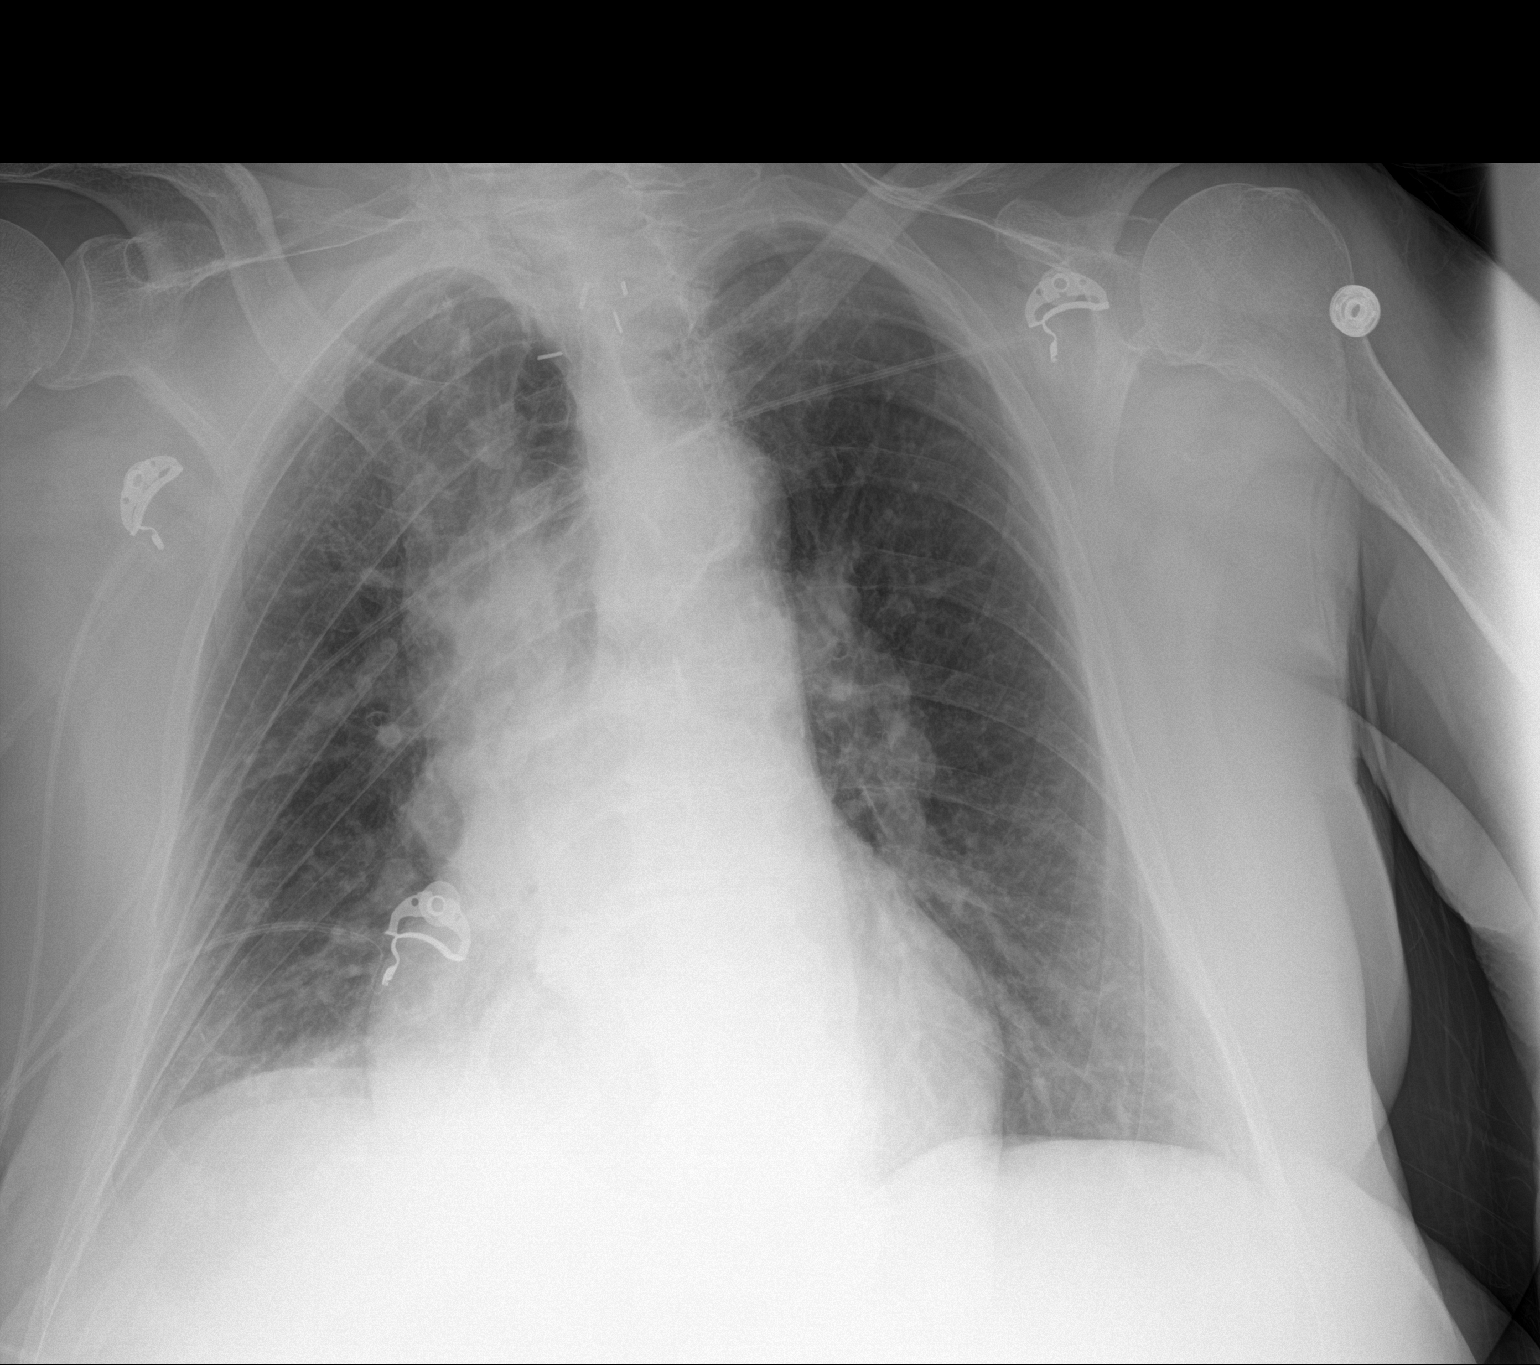

[1 of 1 positions shown; findings below may reference images not displayed]

FINDINGS: Lungs are clear.

Heart size and mediastinal contours are within normal limits.Aortic
Atherosclerosis (N5E7P-170.0).

No effusion.

Visualized bones unremarkable. Surgical clips at the thoracic inlet.
IMPRESSION: No acute cardiopulmonary disease.

## 2024-02-16 IMAGING — CT CT CHEST W/O CM
2 of 4 series · 15 of 36 positions shown, 18 images · non-contrast
Comparison: AP chest 07/14/2021 chest two views 07/11/2021, chest
two views 11/14/2013; CT chest 07/12/2021

CLINICAL DATA: Small right pleural effusion. Follow-up [DATE].
Persistent shortness of breath. Now on oxygen. History of COPD.
Evaluate for pneumonia.



[Series 2: thorax · axial · 0.68mm/px · z∈[-291,-51]mm · 12 of 142 slices shown, 15 images]
[im 11/142  mediastinal]
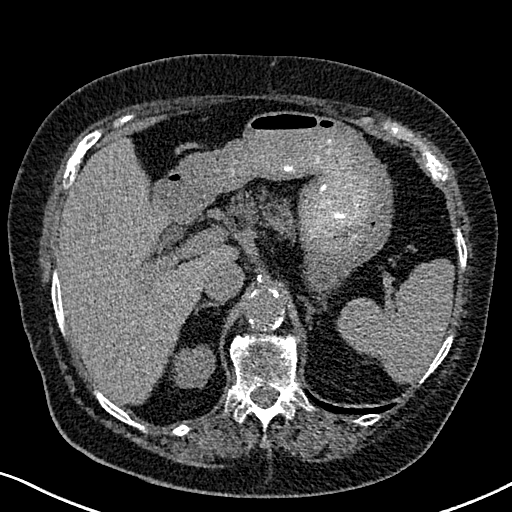
[im 11/142  lung]
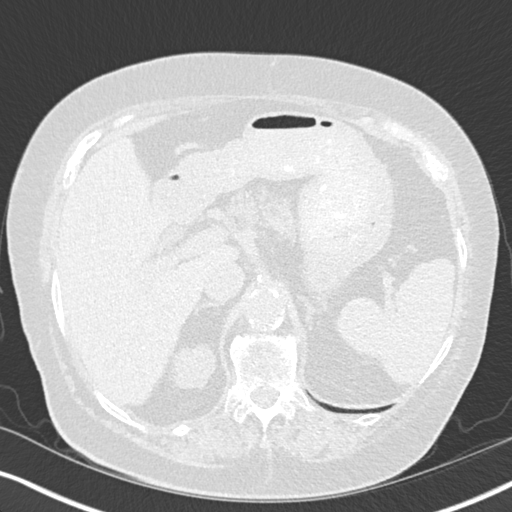
[im 22/142  lung]
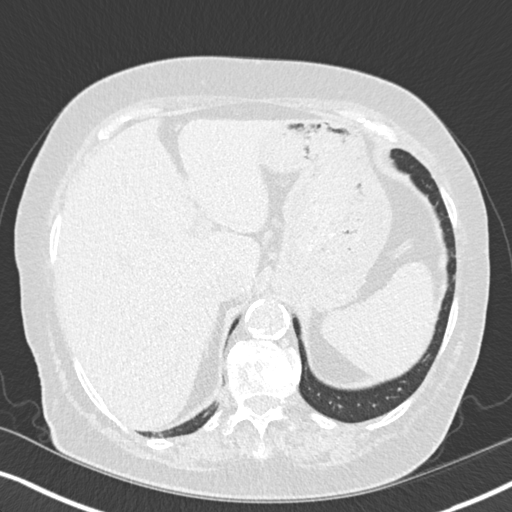
[im 33/142  lung]
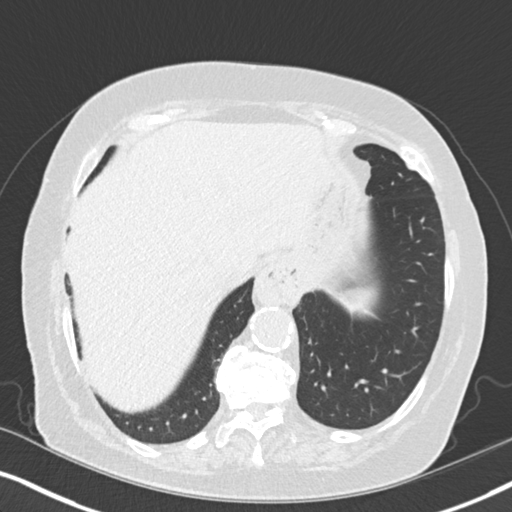
[im 44/142  lung]
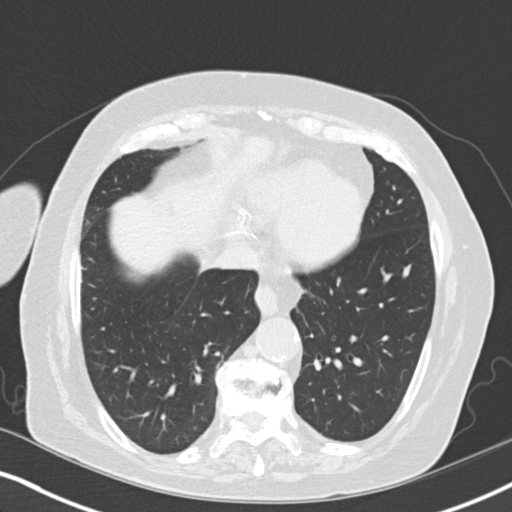
[im 55/142  mediastinal]
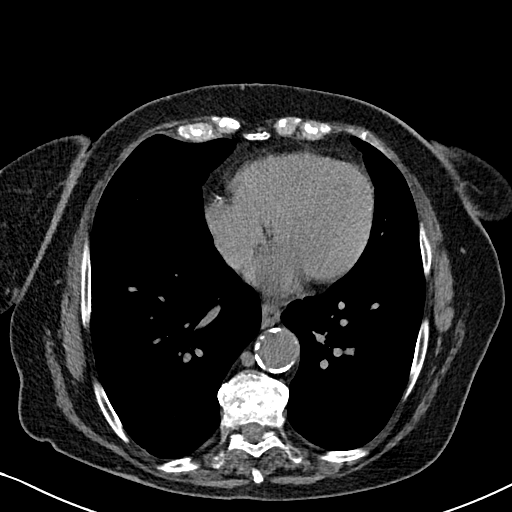
[im 55/142  lung]
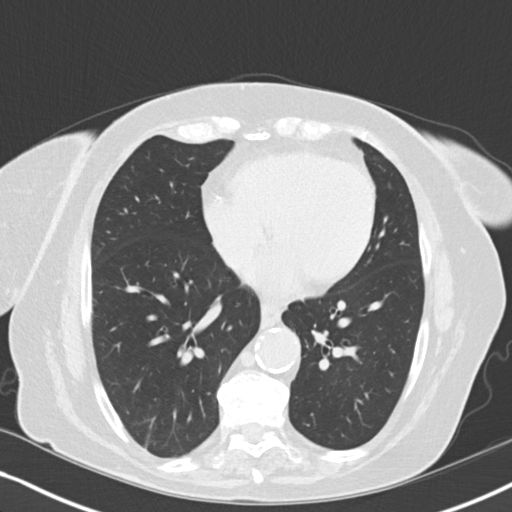
[im 66/142  lung]
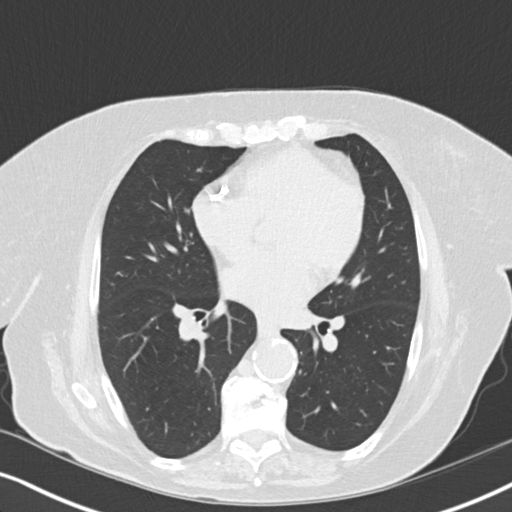
[im 76/142  lung]
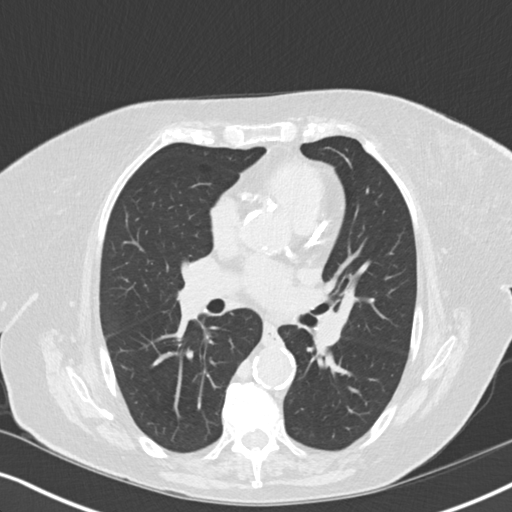
[im 87/142  lung]
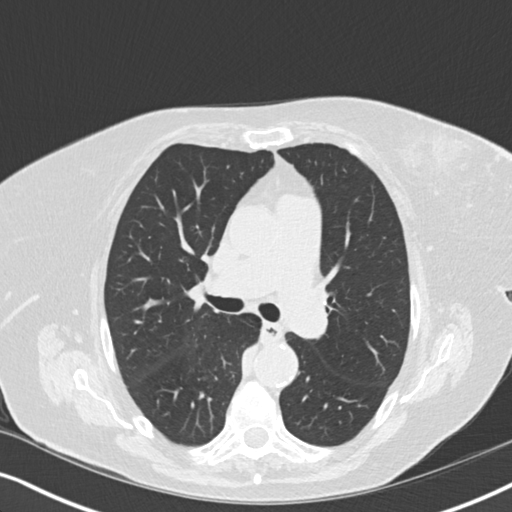
[im 98/142  mediastinal]
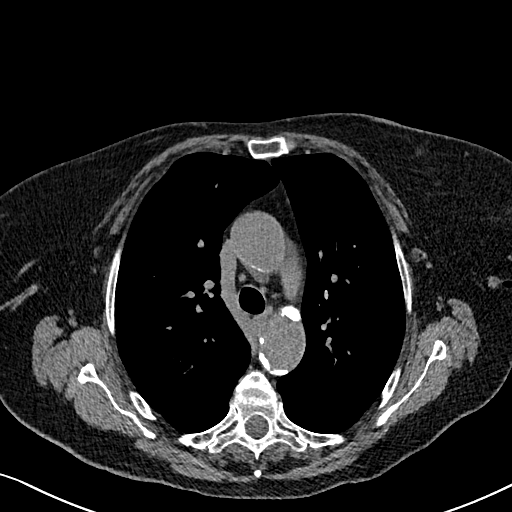
[im 98/142  lung]
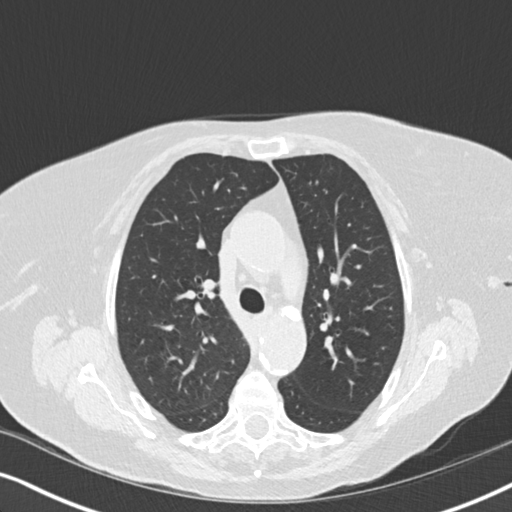
[im 109/142  lung]
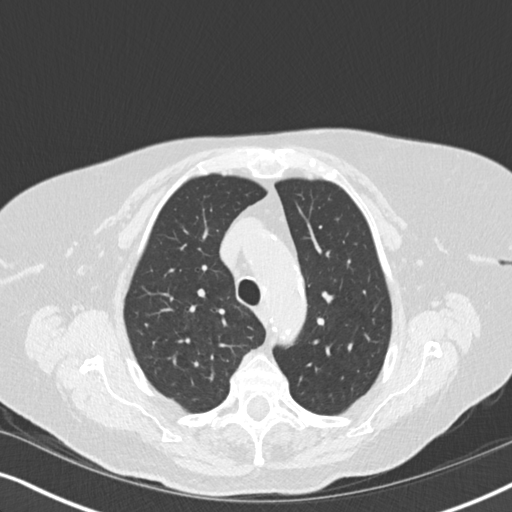
[im 120/142  lung]
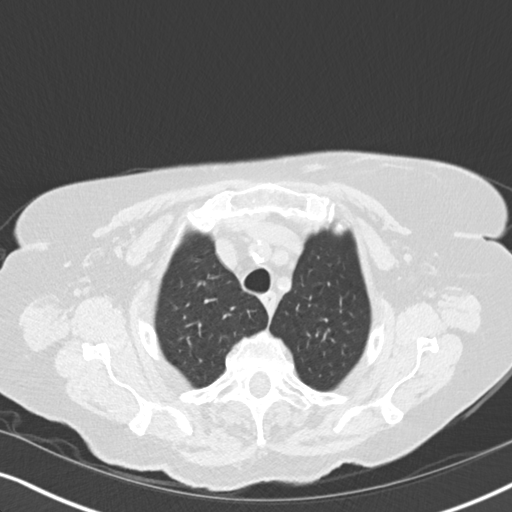
[im 131/142  lung]
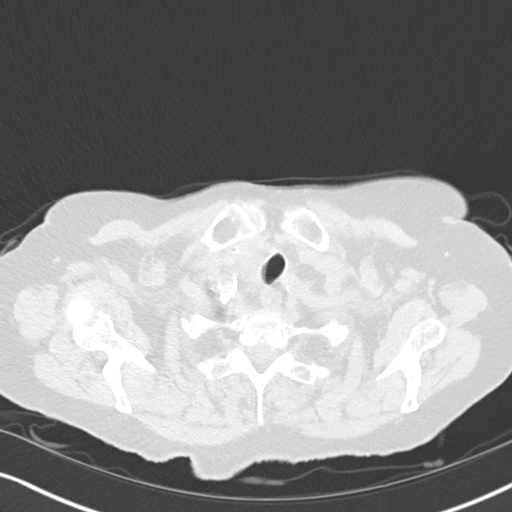

[Series 5: coronal · coronal · 0.59mm/px · 3 of 133 slices shown]
[im 27/133  lung]
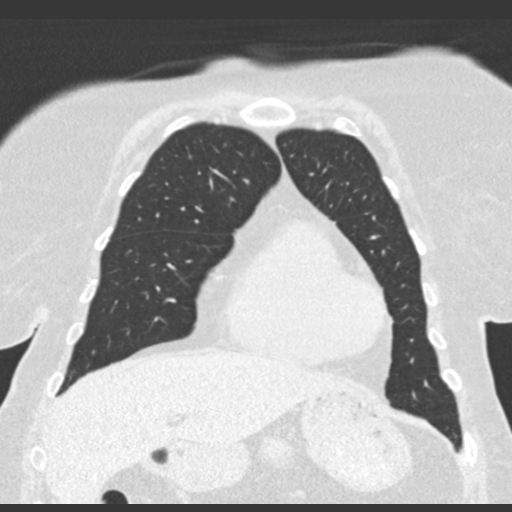
[im 53/133  lung]
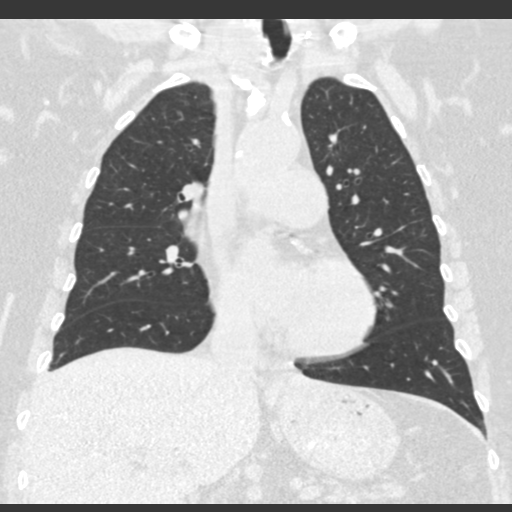
[im 80/133  lung]
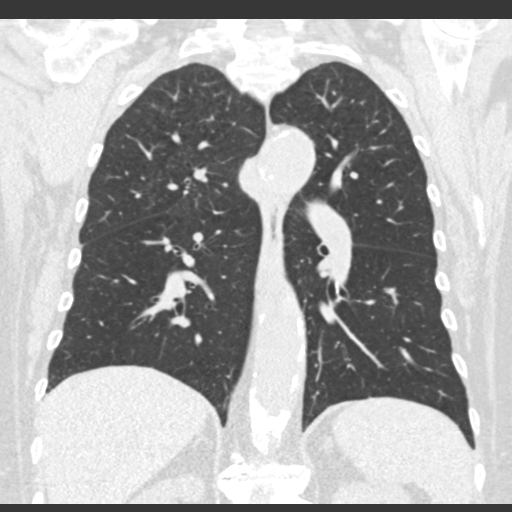

[15 of 36 positions shown; findings below may reference images not displayed]

FINDINGS: Cardiovascular: Heart size is normal. No pericardial effusion. Dense
coronary artery calcifications. Moderate to high-grade
atherosclerotic calcifications within the thoracic aorta. No
thoracic aortic aneurysm. The main pulmonary artery measures up to
3.0 cm in caliber at the upper limits of normal.

Mediastinum/Nodes: No pathologically enlarged axillary mediastinal
or hilar lymph nodes by CT criteria. The right thyroid lobe is again
enlarged. There is again a low-density region within the right
thyroid lobe measuring up to 4 cm in AP dimension, similar to prior.
Reportedly this was biopsied in 2526. Unchanged mild-to-moderate
sliding hiatal hernia.

Lungs/Pleura: The central airways are patent. There is
mild-to-moderate chronic cystic emphysematous change especially
within the upper lungs. Note is made previously of bilateral
pulmonary emboli not evaluated on the current noncontrast study.
Resolution of the prior small right pleural effusion. There is
minimal curvilinear and ground-glass likely scarring within the
medial right lower lobe adjacent to osteophytes within the thoracic
spine. Resolution of the prior right lower lobe dependent
consolidation.

Upper Abdomen: No acute abnormality.

Musculoskeletal: Mild dextrocurvature of the midthoracic spine.
Mildly increased kyphotic angulation centered within the mid to
upper thoracic spine. Minimal anterior height loss of the T6 and T7
vertebral bodies, unchanged from prior and chronic. Moderate to
severe multilevel degenerative disc changes of the mid and lower
thoracic spine.
IMPRESSION: Compared to 07/12/2021:

1. Resolution of the prior small right pleural effusion and right
basilar consolidation. Note is made of bilateral pulmonary emboli
seen on the prior postcontrast study. The pulmonary emboli cannot be
evaluated on the current noncontrast study.
2. Dense coronary artery calcifications, an indicator of coronary
artery disease.
3. Emphysema.

Aortic Atherosclerosis (8YG00-EB4.4) and Emphysema (8YG00-HBQ.Z).
